# Patient Record
Sex: Female | Born: 1965
Health system: Southern US, Community
[De-identification: ages and names within clinical notes are randomized; demographics above are authoritative.]

## PROBLEM LIST (undated history)

## (undated) VITALS — BP 119/82 | HR 87 | Temp 99.2°F | Resp 16 | Ht 69.0 in | Wt 181.0 lb

## (undated) DIAGNOSIS — Z803 Family history of malignant neoplasm of breast: Secondary | ICD-10-CM

## (undated) DIAGNOSIS — D649 Anemia, unspecified: Secondary | ICD-10-CM

## (undated) DIAGNOSIS — IMO0001 Reserved for inherently not codable concepts without codable children: Secondary | ICD-10-CM

## (undated) DIAGNOSIS — E119 Type 2 diabetes mellitus without complications: Secondary | ICD-10-CM

## (undated) DIAGNOSIS — R519 Headache, unspecified: Secondary | ICD-10-CM

## (undated) DIAGNOSIS — M1990 Unspecified osteoarthritis, unspecified site: Secondary | ICD-10-CM

## (undated) DIAGNOSIS — F329 Major depressive disorder, single episode, unspecified: Secondary | ICD-10-CM

## (undated) DIAGNOSIS — R51 Headache: Secondary | ICD-10-CM

## (undated) DIAGNOSIS — C50919 Malignant neoplasm of unspecified site of unspecified female breast: Secondary | ICD-10-CM

## (undated) DIAGNOSIS — IMO0002 Reserved for concepts with insufficient information to code with codable children: Secondary | ICD-10-CM

## (undated) DIAGNOSIS — F32A Depression, unspecified: Secondary | ICD-10-CM

## (undated) DIAGNOSIS — I890 Lymphedema, not elsewhere classified: Secondary | ICD-10-CM

## (undated) DIAGNOSIS — F99 Mental disorder, not otherwise specified: Secondary | ICD-10-CM

## (undated) DIAGNOSIS — R232 Flushing: Secondary | ICD-10-CM

## (undated) HISTORY — DX: Flushing: R23.2

## (undated) HISTORY — PX: TUBAL LIGATION: SHX77

## (undated) HISTORY — DX: Unspecified osteoarthritis, unspecified site: M19.90

## (undated) HISTORY — DX: Headache, unspecified: R51.9

## (undated) HISTORY — DX: Reserved for inherently not codable concepts without codable children: IMO0001

## (undated) HISTORY — DX: Reserved for concepts with insufficient information to code with codable children: IMO0002

## (undated) HISTORY — DX: Headache: R51

## (undated) HISTORY — DX: Malignant neoplasm of unspecified site of unspecified female breast: C50.919

## (undated) HISTORY — DX: Family history of malignant neoplasm of breast: Z80.3

---

## 1993-10-31 HISTORY — PX: KNEE SURGERY: SHX244

## 2000-07-25 ENCOUNTER — Emergency Department (HOSPITAL_COMMUNITY): Admission: EM | Admit: 2000-07-25 | Discharge: 2000-07-25 | Payer: Self-pay | Admitting: Emergency Medicine

## 2000-08-05 ENCOUNTER — Emergency Department (HOSPITAL_COMMUNITY): Admission: EM | Admit: 2000-08-05 | Discharge: 2000-08-05 | Payer: Self-pay | Admitting: *Deleted

## 2000-08-15 ENCOUNTER — Inpatient Hospital Stay (HOSPITAL_COMMUNITY): Admission: AD | Admit: 2000-08-15 | Discharge: 2000-08-15 | Payer: Self-pay | Admitting: *Deleted

## 2000-08-20 ENCOUNTER — Emergency Department (HOSPITAL_COMMUNITY): Admission: EM | Admit: 2000-08-20 | Discharge: 2000-08-21 | Payer: Self-pay | Admitting: Emergency Medicine

## 2000-09-29 ENCOUNTER — Emergency Department (HOSPITAL_COMMUNITY): Admission: EM | Admit: 2000-09-29 | Discharge: 2000-09-29 | Payer: Self-pay | Admitting: Emergency Medicine

## 2000-09-30 ENCOUNTER — Emergency Department (HOSPITAL_COMMUNITY): Admission: EM | Admit: 2000-09-30 | Discharge: 2000-09-30 | Payer: Self-pay | Admitting: Emergency Medicine

## 2000-10-03 ENCOUNTER — Emergency Department (HOSPITAL_COMMUNITY): Admission: EM | Admit: 2000-10-03 | Discharge: 2000-10-03 | Payer: Self-pay | Admitting: Emergency Medicine

## 2000-12-04 ENCOUNTER — Emergency Department (HOSPITAL_COMMUNITY): Admission: EM | Admit: 2000-12-04 | Discharge: 2000-12-05 | Payer: Self-pay | Admitting: Emergency Medicine

## 2000-12-19 ENCOUNTER — Emergency Department (HOSPITAL_COMMUNITY): Admission: EM | Admit: 2000-12-19 | Discharge: 2000-12-19 | Payer: Self-pay | Admitting: *Deleted

## 2001-02-21 ENCOUNTER — Emergency Department (HOSPITAL_COMMUNITY): Admission: EM | Admit: 2001-02-21 | Discharge: 2001-02-21 | Payer: Self-pay | Admitting: Emergency Medicine

## 2001-02-27 ENCOUNTER — Emergency Department (HOSPITAL_COMMUNITY): Admission: EM | Admit: 2001-02-27 | Discharge: 2001-02-27 | Payer: Self-pay | Admitting: Emergency Medicine

## 2001-03-21 ENCOUNTER — Emergency Department (HOSPITAL_COMMUNITY): Admission: EM | Admit: 2001-03-21 | Discharge: 2001-03-21 | Payer: Self-pay | Admitting: Emergency Medicine

## 2001-03-28 ENCOUNTER — Emergency Department (HOSPITAL_COMMUNITY): Admission: EM | Admit: 2001-03-28 | Discharge: 2001-03-28 | Payer: Self-pay | Admitting: Emergency Medicine

## 2001-03-28 ENCOUNTER — Encounter: Payer: Self-pay | Admitting: Emergency Medicine

## 2001-05-10 ENCOUNTER — Emergency Department (HOSPITAL_COMMUNITY): Admission: EM | Admit: 2001-05-10 | Discharge: 2001-05-10 | Payer: Self-pay

## 2001-05-17 ENCOUNTER — Emergency Department (HOSPITAL_COMMUNITY): Admission: EM | Admit: 2001-05-17 | Discharge: 2001-05-17 | Payer: Self-pay | Admitting: Emergency Medicine

## 2001-05-17 ENCOUNTER — Encounter: Payer: Self-pay | Admitting: Emergency Medicine

## 2001-05-25 ENCOUNTER — Emergency Department (HOSPITAL_COMMUNITY): Admission: EM | Admit: 2001-05-25 | Discharge: 2001-05-25 | Payer: Self-pay | Admitting: Emergency Medicine

## 2001-07-02 ENCOUNTER — Emergency Department (HOSPITAL_COMMUNITY): Admission: EM | Admit: 2001-07-02 | Discharge: 2001-07-02 | Payer: Self-pay | Admitting: Emergency Medicine

## 2001-07-05 ENCOUNTER — Emergency Department (HOSPITAL_COMMUNITY): Admission: EM | Admit: 2001-07-05 | Discharge: 2001-07-05 | Payer: Self-pay | Admitting: Emergency Medicine

## 2001-07-16 ENCOUNTER — Emergency Department (HOSPITAL_COMMUNITY): Admission: EM | Admit: 2001-07-16 | Discharge: 2001-07-16 | Payer: Self-pay | Admitting: Emergency Medicine

## 2001-07-16 ENCOUNTER — Encounter: Payer: Self-pay | Admitting: Emergency Medicine

## 2001-09-05 ENCOUNTER — Emergency Department (HOSPITAL_COMMUNITY): Admission: EM | Admit: 2001-09-05 | Discharge: 2001-09-05 | Payer: Self-pay | Admitting: Emergency Medicine

## 2001-09-28 ENCOUNTER — Emergency Department (HOSPITAL_COMMUNITY): Admission: EM | Admit: 2001-09-28 | Discharge: 2001-09-28 | Payer: Self-pay | Admitting: Emergency Medicine

## 2002-01-15 ENCOUNTER — Emergency Department (HOSPITAL_COMMUNITY): Admission: EM | Admit: 2002-01-15 | Discharge: 2002-01-15 | Payer: Self-pay | Admitting: Emergency Medicine

## 2002-02-04 ENCOUNTER — Emergency Department (HOSPITAL_COMMUNITY): Admission: EM | Admit: 2002-02-04 | Discharge: 2002-02-04 | Payer: Self-pay | Admitting: Emergency Medicine

## 2002-03-22 ENCOUNTER — Emergency Department (HOSPITAL_COMMUNITY): Admission: EM | Admit: 2002-03-22 | Discharge: 2002-03-22 | Payer: Self-pay | Admitting: Emergency Medicine

## 2002-04-29 ENCOUNTER — Emergency Department (HOSPITAL_COMMUNITY): Admission: EM | Admit: 2002-04-29 | Discharge: 2002-04-29 | Payer: Self-pay | Admitting: Emergency Medicine

## 2002-07-22 ENCOUNTER — Emergency Department (HOSPITAL_COMMUNITY): Admission: EM | Admit: 2002-07-22 | Discharge: 2002-07-22 | Payer: Self-pay | Admitting: Emergency Medicine

## 2002-09-24 ENCOUNTER — Emergency Department (HOSPITAL_COMMUNITY): Admission: EM | Admit: 2002-09-24 | Discharge: 2002-09-24 | Payer: Self-pay | Admitting: Emergency Medicine

## 2003-01-02 ENCOUNTER — Emergency Department (HOSPITAL_COMMUNITY): Admission: EM | Admit: 2003-01-02 | Discharge: 2003-01-02 | Payer: Self-pay | Admitting: Emergency Medicine

## 2003-01-22 ENCOUNTER — Inpatient Hospital Stay (HOSPITAL_COMMUNITY): Admission: AD | Admit: 2003-01-22 | Discharge: 2003-01-22 | Payer: Self-pay | Admitting: Obstetrics and Gynecology

## 2003-01-22 ENCOUNTER — Encounter: Payer: Self-pay | Admitting: Obstetrics and Gynecology

## 2003-02-06 ENCOUNTER — Other Ambulatory Visit: Admission: RE | Admit: 2003-02-06 | Discharge: 2003-02-06 | Payer: Self-pay | Admitting: Obstetrics and Gynecology

## 2003-02-06 ENCOUNTER — Encounter: Admission: RE | Admit: 2003-02-06 | Discharge: 2003-02-06 | Payer: Self-pay | Admitting: Obstetrics and Gynecology

## 2003-04-17 ENCOUNTER — Emergency Department (HOSPITAL_COMMUNITY): Admission: EM | Admit: 2003-04-17 | Discharge: 2003-04-17 | Payer: Self-pay

## 2003-04-29 ENCOUNTER — Emergency Department (HOSPITAL_COMMUNITY): Admission: EM | Admit: 2003-04-29 | Discharge: 2003-04-29 | Payer: Self-pay | Admitting: Emergency Medicine

## 2003-11-01 HISTORY — PX: UTERINE FIBROID SURGERY: SHX826

## 2003-11-10 ENCOUNTER — Emergency Department (HOSPITAL_COMMUNITY): Admission: EM | Admit: 2003-11-10 | Discharge: 2003-11-10 | Payer: Self-pay | Admitting: Emergency Medicine

## 2004-01-12 ENCOUNTER — Inpatient Hospital Stay (HOSPITAL_COMMUNITY): Admission: AD | Admit: 2004-01-12 | Discharge: 2004-01-13 | Payer: Self-pay | Admitting: Obstetrics

## 2004-04-07 ENCOUNTER — Encounter (INDEPENDENT_AMBULATORY_CARE_PROVIDER_SITE_OTHER): Payer: Self-pay | Admitting: Specialist

## 2004-04-07 ENCOUNTER — Ambulatory Visit (HOSPITAL_COMMUNITY): Admission: RE | Admit: 2004-04-07 | Discharge: 2004-04-07 | Payer: Self-pay | Admitting: Obstetrics

## 2004-04-17 ENCOUNTER — Emergency Department (HOSPITAL_COMMUNITY): Admission: EM | Admit: 2004-04-17 | Discharge: 2004-04-17 | Payer: Self-pay | Admitting: Emergency Medicine

## 2005-03-14 ENCOUNTER — Emergency Department (HOSPITAL_COMMUNITY): Admission: EM | Admit: 2005-03-14 | Discharge: 2005-03-14 | Payer: Self-pay | Admitting: Emergency Medicine

## 2005-05-12 ENCOUNTER — Ambulatory Visit: Payer: Self-pay | Admitting: Psychiatry

## 2005-05-12 ENCOUNTER — Inpatient Hospital Stay (HOSPITAL_COMMUNITY): Admission: RE | Admit: 2005-05-12 | Discharge: 2005-05-17 | Payer: Self-pay | Admitting: Psychiatry

## 2006-05-01 ENCOUNTER — Emergency Department (HOSPITAL_COMMUNITY): Admission: EM | Admit: 2006-05-01 | Discharge: 2006-05-01 | Payer: Self-pay | Admitting: Emergency Medicine

## 2007-09-19 ENCOUNTER — Emergency Department (HOSPITAL_COMMUNITY): Admission: EM | Admit: 2007-09-19 | Discharge: 2007-09-19 | Payer: Self-pay | Admitting: Emergency Medicine

## 2007-09-29 ENCOUNTER — Emergency Department (HOSPITAL_COMMUNITY): Admission: EM | Admit: 2007-09-29 | Discharge: 2007-09-29 | Payer: Self-pay | Admitting: Family Medicine

## 2007-10-08 ENCOUNTER — Emergency Department (HOSPITAL_COMMUNITY): Admission: EM | Admit: 2007-10-08 | Discharge: 2007-10-08 | Payer: Self-pay | Admitting: Family Medicine

## 2007-12-15 ENCOUNTER — Emergency Department (HOSPITAL_COMMUNITY): Admission: EM | Admit: 2007-12-15 | Discharge: 2007-12-15 | Payer: Self-pay | Admitting: Emergency Medicine

## 2007-12-16 ENCOUNTER — Emergency Department (HOSPITAL_COMMUNITY): Admission: EM | Admit: 2007-12-16 | Discharge: 2007-12-16 | Payer: Self-pay | Admitting: Emergency Medicine

## 2008-03-08 ENCOUNTER — Emergency Department (HOSPITAL_COMMUNITY): Admission: EM | Admit: 2008-03-08 | Discharge: 2008-03-08 | Payer: Self-pay | Admitting: Emergency Medicine

## 2008-03-23 ENCOUNTER — Emergency Department (HOSPITAL_COMMUNITY): Admission: EM | Admit: 2008-03-23 | Discharge: 2008-03-23 | Payer: Self-pay | Admitting: Emergency Medicine

## 2008-03-27 ENCOUNTER — Emergency Department (HOSPITAL_COMMUNITY): Admission: EM | Admit: 2008-03-27 | Discharge: 2008-03-27 | Payer: Self-pay | Admitting: Emergency Medicine

## 2008-04-07 ENCOUNTER — Other Ambulatory Visit: Payer: Self-pay | Admitting: Emergency Medicine

## 2008-04-08 ENCOUNTER — Inpatient Hospital Stay (HOSPITAL_COMMUNITY): Admission: AD | Admit: 2008-04-08 | Discharge: 2008-04-14 | Payer: Self-pay | Admitting: Psychiatry

## 2008-04-08 ENCOUNTER — Ambulatory Visit: Payer: Self-pay | Admitting: Psychiatry

## 2008-04-08 ENCOUNTER — Other Ambulatory Visit: Payer: Self-pay | Admitting: Emergency Medicine

## 2008-08-19 ENCOUNTER — Emergency Department (HOSPITAL_COMMUNITY): Admission: EM | Admit: 2008-08-19 | Discharge: 2008-08-19 | Payer: Self-pay | Admitting: Emergency Medicine

## 2009-07-22 ENCOUNTER — Encounter: Admission: RE | Admit: 2009-07-22 | Discharge: 2009-07-22 | Payer: Self-pay | Admitting: Specialist

## 2009-10-31 HISTORY — PX: FOOT SURGERY: SHX648

## 2009-11-06 ENCOUNTER — Emergency Department (HOSPITAL_COMMUNITY): Admission: EM | Admit: 2009-11-06 | Discharge: 2009-11-06 | Payer: Self-pay | Admitting: Emergency Medicine

## 2010-02-03 ENCOUNTER — Emergency Department (HOSPITAL_COMMUNITY): Admission: EM | Admit: 2010-02-03 | Discharge: 2010-02-03 | Payer: Self-pay | Admitting: Emergency Medicine

## 2010-05-05 ENCOUNTER — Inpatient Hospital Stay (HOSPITAL_COMMUNITY): Admission: AD | Admit: 2010-05-05 | Discharge: 2010-05-05 | Payer: Self-pay | Admitting: Obstetrics & Gynecology

## 2010-05-11 ENCOUNTER — Emergency Department (HOSPITAL_COMMUNITY): Admission: EM | Admit: 2010-05-11 | Discharge: 2010-05-11 | Payer: Self-pay | Admitting: Emergency Medicine

## 2010-08-04 ENCOUNTER — Ambulatory Visit (HOSPITAL_COMMUNITY): Admission: RE | Admit: 2010-08-04 | Discharge: 2010-08-04 | Payer: Self-pay | Admitting: Podiatry

## 2010-11-21 ENCOUNTER — Encounter: Payer: Self-pay | Admitting: Specialist

## 2010-11-21 ENCOUNTER — Encounter: Payer: Self-pay | Admitting: Obstetrics

## 2010-12-25 ENCOUNTER — Inpatient Hospital Stay (HOSPITAL_COMMUNITY)
Admission: AD | Admit: 2010-12-25 | Discharge: 2010-12-25 | Disposition: A | Discharge status: Home / Self Care | Payer: Self-pay | Source: Ambulatory Visit | Attending: Obstetrics & Gynecology | Admitting: Obstetrics & Gynecology

## 2010-12-25 DIAGNOSIS — K219 Gastro-esophageal reflux disease without esophagitis: Secondary | ICD-10-CM

## 2010-12-25 DIAGNOSIS — R109 Unspecified abdominal pain: Secondary | ICD-10-CM

## 2010-12-25 LAB — URINALYSIS, ROUTINE W REFLEX MICROSCOPIC
Bilirubin Urine: NEGATIVE
Ketones, ur: NEGATIVE mg/dL
Nitrite: NEGATIVE
Protein, ur: NEGATIVE mg/dL
Urobilinogen, UA: 0.2 mg/dL (ref 0.0–1.0)
pH: 5.5 (ref 5.0–8.0)

## 2010-12-25 LAB — COMPREHENSIVE METABOLIC PANEL
AST: 17 U/L (ref 0–37)
CO2: 26 mEq/L (ref 19–32)
Calcium: 9 mg/dL (ref 8.4–10.5)
Creatinine, Ser: 0.52 mg/dL (ref 0.4–1.2)
GFR calc Af Amer: >60 mL/min (ref >60)
GFR calc non Af Amer: >60 mL/min (ref >60)

## 2010-12-25 LAB — CBC
Hemoglobin: 11.8 g/dL — ABNORMAL LOW (ref 12.0–15.0)
MCH: 29.9 pg (ref 26.0–34.0)
MCHC: 32.5 g/dL (ref 30.0–36.0)

## 2011-01-16 LAB — URINALYSIS, ROUTINE W REFLEX MICROSCOPIC
Bilirubin Urine: NEGATIVE
Bilirubin Urine: NEGATIVE
Glucose, UA: NEGATIVE mg/dL
Ketones, ur: NEGATIVE mg/dL
Nitrite: NEGATIVE
Specific Gravity, Urine: >1.03 — ABNORMAL HIGH (ref 1.005–1.030)
Specific Gravity, Urine: 1.029 (ref 1.005–1.030)
pH: 6 (ref 5.0–8.0)
pH: 5 (ref 5.0–8.0)

## 2011-01-16 LAB — COMPREHENSIVE METABOLIC PANEL
ALT: 9 U/L (ref 0–35)
Albumin: 3.6 g/dL (ref 3.5–5.2)
Alkaline Phosphatase: 43 U/L (ref 39–117)
GFR calc Af Amer: >60 mL/min (ref >60)
Potassium: 3.5 mEq/L (ref 3.5–5.1)
Sodium: 137 mEq/L (ref 135–145)
Total Protein: 6.6 g/dL (ref 6.0–8.3)

## 2011-01-16 LAB — CBC
Platelets: 338 10*3/uL (ref 150–400)
RDW: 14 % (ref 11.5–15.5)
WBC: 10.1 10*3/uL (ref 4.0–10.5)

## 2011-01-16 LAB — POCT PREGNANCY, URINE: Preg Test, Ur: NEGATIVE

## 2011-01-16 LAB — AMYLASE: Amylase: 53 U/L (ref 0–105)

## 2011-03-15 NOTE — H&P (Signed)
Laura Matthews, Laura Matthews NO.:  [id number]   MEDICAL RECORD NO.:  [id number]          PATIENT TYPE:  IPS   LOCATION:  0503                          FACILITY:  [facility]   PHYSICIAN:  Geoffery Lyons, M.D.      DATE OF BIRTH:  1966-09-04   DATE OF ADMISSION:  04/08/2008  DATE OF DISCHARGE:                       PSYCHIATRIC ADMISSION ASSESSMENT   A 45 old female voluntarily admitted April 08, 2008.   HISTORY OF PRESENT ILLNESS:  The patient presents with a history of  opiate dependency, using anywhere up to 20 a day of anything that she  can get, mostly oxycodone, using daily, swallowing the pills.  Her last  use was on Sunday, which was June 7.  She is just tired of spending  money, ruining her body and having to go through withdrawal symptoms  when she is unable to obtain medication.  Sleep has been poor, appetite  decreased.  Denies any depression or suicidal thoughts.   PAST PSYCHIATRIC HISTORY:  The second time to [facility].  The  patient was here in 2005 for the same thing opiate dependence.  Relapsed almost immediately after her last admission.   SOCIAL HISTORY:  A 45 year old single female lives her adult daughter.  She is unemployed.  No legal problems.   FAMILY HISTORY:  Unknown.   ALCOHOL/DRUG HISTORY:  Denies any alcohol use.  She is trying to stop  smoking.  Again, daily use of opiates and was positive for cocaine.   PRIMARY CARE Laura Matthews:  None.   MEDICAL PROBLEMS:  Denies any acute or chronic health issues.   MEDICATIONS:  None prior to arrival.   DRUG ALLERGIES:  No known allergies.   PHYSICAL EXAMINATION:  The patient was fully assessed at [facility] where she received Imodium, Ativan, Xanax, and a  nicotine patch.  Temperature 98, heart rate 97, respirations 20, blood  pressure is 118/79, 5 feet 10 inches tall, 179 pounds.   LABORATORY DATA:  White count is elevated 11.3, platelet count of 42.  BMP within normal limits.   Alcohol level less than 5.  Urine drug screen  is positive for opiates, positive for cocaine.  Acetaminophen level less  than 10.   MENTAL STATUS EXAM:  She is in the bed, eyes are closed, sleeping.  Answering questions, somewhat irritable.  She apologized for being a  little curette.  Her speech is clear.  Thought processes are coherent  and goal directed.  No evidence of any delusional thinking, cognitive  function intact.  Memory is good.  Judgment insight is fair.   AXIS I:  Opiate dependence, cocaine abuse.  AXIS II:  Deferred.  AXIS III:  No known medical conditions.  AXIS IV:  Psychosocial problems, chronic substance use.  Also problems  with economic issues.  AXIS V:  Current is 45.   PLAN:  She contracted for safety.  Stabilize mood and thinking.  Will  detox the patient and work on relapse prevention.  Will continue to  assess comorbidities.  The patient is to attend the red group.  We will  have  medication available for sleep.  Case manager will discuss with the  patient her plan for relapse.  Length of stay is 3-5 days.      Landry Corporal, N.P.      Geoffery Lyons, M.D.  Electronically Signed    JO/MEDQ  D:  04/09/2008  T:  04/09/2008  Job:  [id number]

## 2011-03-18 NOTE — H&P (Signed)
Laura Matthews, KASPAREK NO.:  [id number]   MEDICAL RECORD NO.:  [id number]          PATIENT TYPE:  IPS   LOCATION:  0501                          FACILITY:  [facility]   PHYSICIAN:  Anselm Jungling, MD  DATE OF BIRTH:  Jun 27, 1966   DATE OF ADMISSION:  05/12/2005  DATE OF DISCHARGE:                         PSYCHIATRIC ADMISSION ASSESSMENT   IDENTIFYING INFORMATION:  This is a 45 year old African-American female who  is single.  This is a voluntary admission.   HISTORY OF PRESENT ILLNESS:  The patient with a history of depression  relapsed on opiates about 2 to 2-1/2 months ago after being clean 10-12  months while she was in prison.  She had been in prison for violating  probation after a larceny charge.  She failed probation by failing her drug  test.  Micah Flesher back to prison for awhile, was sober for about 2 months after  she got out in March, then relapsed, taking opiates.  She started using  opiates in 2000, introduced to them recreationally by family member.  Dosage  has escalated to the point where she is taking about 20 tablets daily of  either Percocet or Lortab or whatever she is able to get her hands on.  No  IV drug abuse.  Now reports increasing depression for the past 3-4 weeks,  thinking of overdosing on pills and has a history of a prior overdose in  1998.  She has been anhedonic, with decreased concentration, some vague  paranoia, feeling like people are talking about her, feeling like she needs  to look around and check herself for people looking at her, feeling  apathetic, unable to get organized and get out of bed in the morning and get  to work.  Quit her job last week because she knew she would get fired for  her absenteeism.  Also relapsed in using cocaine and has been using  approximately $200 a day about 3 times a week for the past few weeks.  Feels  overwhelmed by family stressors, including her 43 year old that she has  found out now is pregnant for  the second time.  She is supporting the family  but unable to get interested in any work.  She endorses a history of  episodes of depression which come like attacks out of the blue.  Denies any  mania, panic or hallucinations.   PAST PSYCHIATRIC HISTORY:  The patient has no current mental health care at  this time.  This is her first admission to [facility].  She has a history of prior admission to [facility] in 1998 for a  drug overdose, at which time she was on the Behavioral Health unit but  denies that she was ever treated with medications for depression, reports  onset of depression about age 6 or 77 with episodes of severity from time  to time, now using substances with history of opiate abuse and dependence on  and off since the year 2000, longest clean and sober was approximately 10  months.  Urine drug screen was positive for opiates, cocaine and  benzodiazepines.  She had taken 3 or 4 Xanax this week but reports that she  rarely takes those.  However, endorses feeling anxious and tremulous today.   SOCIAL HISTORY:  The patient is currently living with her 44 year old  daughter who has a young child and is pregnant with a second child.  She has  one 60 year old son who is currently in prison and a 24 year old daughter  that lives with her own father.  The patient has currently been working in [employer] in town, quit her job last week, no current legal charges.   ALCOHOL AND DRUG HISTORY:  Noted above.   PAST MEDICAL HISTORY:  The patient is followed by Dr. Corine Shelter here  in town, denies any current medical problems, has had some past problems  with migraine headaches for which she was prescribed Vicoprofen.  Denies any  history of seizures, blackouts or memory loss.  No myocardial infarction, no  diabetes mellitus, no history of liver disease.  Does have a prior history  of bilateral tubal ligation.   MEDICATIONS:  Vicoprofen which  was prescribed for headaches.   DRUG ALLERGIES:  No known drug allergies.   POSITIVE PHYSICAL FINDINGS:  This is a well-nourished, well-developed,  healthy-appearing African-American female who does have some puffiness  around her eyes and is complaining of some loose stools, rumbling intestines  today.  Review of systems also positive for feeling sluggish, with poor  sleep.  Denies any headache or pain today, has had some intestinal cramping,  nausea, feeling jittery, restless, sleep decreased to 2-3 hours per night,  and strong anhedonia.  No weight change.   PHYSICAL EXAMINATION:  Five feet 10 inches tall, 187 pounds, temperature  98.6, pulse 66, respirations 16, blood pressure 114/66.  Her full physical  examination was done in the emergency room, is noted in the record.   DIAGNOSTIC STUDIES:  WBC 8.4, hemoglobin 11.7, hematocrit 34.4, platelets  381,000.  Sodium 137, potassium 3.4, chloride 101, CO2 27, BUN 4, creatinine  0.7, glucose 154.  AST 27, ALT 34, alkaline phosphatase 64, total bilirubin  0.3.  Acetaminophen level was less than 10 in the emergency room, salicylate  level was less than 4.  TSH is currently pending as is her routine  urinalysis.   MENTAL STATUS EXAM:  Fully alert female, appears generally healthy, with  affect flattening, tearful.  Appears guarded, does warm with conversation in  terms of her mood but manner continues to be apathetic and guarded.  Speech  is slowed and decreased in amount..  Mood is depressed, hopeless.  Thought  process positive for suicide ideation with a plan to overdose on  medications.  No homicidal ideation or auditory or visual hallucinations.  Clearly describes some paranoia, with guarding, feeling unsafe in large  groups, feeling like everyone is looking at her in the cafeteria, not  tolerating crowds at all.  Cognitively she is intact and oriented x4. Insight adequate, impulse control and judgment within normal limits.   Intellect within normal limits.   ADMISSION DIAGNOSIS:  AXIS I:  Rule out bipolar 2 disorder, opiate abuse and  dependence, polysubstance abuse.  AXIS II:  Deferred.  AXIS III:  No diagnosis.  AXIS IV:  Family stress with parenting.  AXIS V:  Current 28-35, past year 30.   INITIAL PLAN OF CARE:  Plan is to voluntarily admit the patient with q.15  minute checks in place for a safe detox and to alleviate her suicidal  thought.  Because of her episodic depression, feeling that she has sharp  turns in mood, we are going to start her on Lamictal 25 mg daily.  We  started her on a clonidine protocol to withdraw her from the opiate drugs.  We will also start her on Risperdal 0.25 mg q.6h p.r.n. for agitation or if  her paranoia gets worse.  Meanwhile, we have also placed her on a Librium  protocol, although her description of taking the Xanax is not matching up  with her anxiety and agitation.  If she becomes over sedated we will stop  it, but at this point we felt that the Librium protocol was necessary.  We  have discussed the plan of care with her.  For aftercare, she feels that one  of her main triggers is returning to the apartment where she has a lot of  memories of drug use.  She has had an offer from a clean and sober friend in  [place] to come live there and change her environment, which she is strongly  considering.  Meanwhile, we will try to put together some aftercare here  until she can get to [place], which is her plan.   ESTIMATED LENGTH OF STAY:  5 days.       MAS/MEDQ  D:  05/13/2005  T:  05/13/2005  Job:  [id number]

## 2011-03-18 NOTE — Discharge Summary (Signed)
NAME:  Laura Matthews, Laura Matthews NO.:  [id number]   MEDICAL RECORD NO.:  [id number]          PATIENT TYPE:  IPS   LOCATION:  0501                          FACILITY:  [facility]   PHYSICIAN:  Jeanice Lim, M.D. DATE OF BIRTH:  1966-05-07   DATE OF ADMISSION:  05/12/2005  DATE OF DISCHARGE:  05/17/2005                                 DISCHARGE SUMMARY   IDENTIFYING DATA:  This is a 45 year old African-American female, single,  admitted with a history of depression, relapsed on opiates 2-1/2 months ago,  had been clean 10-12 months while in prison for violating probation for  larceny charge, had failed probation by failing a drug test and went back to  prison for awhile and been sober 2 months and then relapsed taking opiates.  Dosage had escalated to the point of being unable to control and feeling  that she wanted to overdose on pills.  No IV drug use.  Had quit her job  last week because she knew she would get fired for absenteeism.  Relapsed  using cocaine, approximately $200 a day 3 times a week, feeling overwhelmed  by family stressors, including her 43 year old that she has found out now is  pregnant.  First admission to [facility], history of prior admission at [facility] for drug overdose.  Took medications for depression at about age 45-  69, using substances off and on with opiates particularly.  Longest period  of sobriety was 10 months.  Urine drug screen positive for opiates, cocaine,  and benzodiazepines.  Had taken 3 to 5 Xanax this week but reports rarely  taking these.   ADMISSION MEDICATIONS:  Vicoprofen prescribed for headaches as per patient.   ALLERGIES:  No known drug allergies.   PHYSICAL AND NEUROLOGICAL EXAMINATION:  Within normal limits.   ROUTINE ADMISSION LABS:  Within normal limits.   MENTAL STATUS EXAM:  Fully alert female, generally in good health, affect is  flattening, tearful, guarded, apathetic.  Speech slow, decreased mood,  depressed,  positive suicidal ideation.  Cognition was intact.  Judgment and  insight were impaired, with poor impulse control.   ADMISSION DIAGNOSES:  AXIS I:  Rule out bipolar II disorder, opiate  dependence, polysubstance abuse, possible benzodiazepines dependence.  AXIS II:  Deferred.  AXIS III:  None.  AXIS IV:  Family stress and parenting.  AXIS V:  28/60.   HOSPITAL COURSE:  The patient was admitted and ordered routine p.r.n.  medications, underwent further monitoring, and was encouraged to participate  in individual, group and milieu therapy, was started on Lamictal.  Risk of a  rash and other medication education given and Risperdal p.r.n. agitation due  to some suspiciousness which is generalized.  The patient reported positive  response to crisis intervention, was stabilized on medications and  discharged in improved condition with no withdrawal symptoms, no psychotic  symptoms, no dangerous ideation, again given medication education and  discharged on:  1.  Multivitamin.  2.  Risperdal 0.25 q.a.m. and 1 mg q.h.s.  3.  Wellbutrin XL 150 mg q.a.m.  4.  Depakote 250 mg  q.h.s.   DISPOSITION:  The patient was discharged to follow up with [facility] and was  planning on moving to [place] next week and follow up with the [facility]  Center in that area.   DISCHARGE DIAGNOSES:  AXIS I:  Rule out bipolar II disorder, opiate  dependence, polysubstance abuse, possible benzodiazepines dependence.  AXIS II:  Deferred.  AXIS III:  None.  AXIS IV:  Family stress and parenting.  AXIS V:  Global assessment of function on discharge was 55.      Jeanice Lim, M.D.  Electronically Signed     JEM/MEDQ  D:  06/23/2005  T:  06/23/2005  Job:  [id number]

## 2011-03-18 NOTE — Op Note (Signed)
NAME:  Laura Matthews, Laura Matthews                         ACCOUNT NO.:  [id number]   MEDICAL RECORD NO.:  [id number]                   PATIENT TYPE:  AMB   LOCATION:  SDC                                  FACILITY:  [facility]   PHYSICIAN:  Kathreen Cosier, M.D.           DATE OF BIRTH:  19-Apr-1966   DATE OF PROCEDURE:  04/07/2004  DATE OF DISCHARGE:                                 OPERATIVE REPORT   PREOPERATIVE DIAGNOSIS:  Dysfunctional uterine bleeding, dysmenorrhea.   POSTOPERATIVE DIAGNOSIS:  Dysfunctional uterine bleeding, dysmenorrhea.   PROCEDURE:  Dilatation and curettage, hysteroscopy, Novasure ablation.   Using general anesthesia with the patient in the lithotomy position, the  perineum and vagina were prepped and draped.  The bladder was emptied with  straight catheter.  Bimanual exam revealed the uterus to be top normal size,  negative adnexa.  A bivalve speculum was placed in the vagina.  The anterior  lip of the cervix was grasped with the tenaculum.  The cervix was curetted  with a small amount of tissue obtained.  The endometrial cavity was sounded  to 10 cm.  The cervical length was measured with a Hegar dilator, the  endometrial cavity length is 5 cm.  The cervix dilated to a number 27 Pratt.  Diagnostic hysteroscope was inserted.  The pump was set at 70 mmHg, fluid  deficit 70 mL.  It was noted there was a large amount of tissue in the  cavity, no polyps were identified.  Hysteroscopy was terminated and the  Novasure device was inserted and seated.  The cavity width was 3 cm.  The  cavity integrity test was done and the cavity was intact.  The ablation was  performed for 105 seconds with 118 watts of power.  After the ablation, the  hysteroscopy was repeated and the cavity was noted to be totally ablated.  The first procedure was terminated.  The patient tolerated the procedure  well and went to the recovery room in good condition.      Kathreen Cosier, M.D.    BAM/MEDQ  D:  04/07/2004  T:  04/07/2004  Job:  [id number]

## 2011-03-18 NOTE — Discharge Summary (Signed)
Laura Matthews, Laura Matthews NO.:  [id number]   MEDICAL RECORD NO.:  [id number]          PATIENT TYPE:  IPS   LOCATION:  0503                          [facility]ACILITY:  [facility]   PHYSICIAN:  Geoffery Lyons, M.D.      DATE OF BIRTH:  1965/12/04   DATE OF ADMISSION:  04/08/2008  DATE OF DISCHARGE:  04/14/2008                               DISCHARGE SUMMARY   CHIEF COMPLAINT/PRESENT ILLNESS:  It was the second admission to [facility].  First one in 2005 for this.  A 45 year old  female voluntarily admitted.  History of opiate dependence using  anywhere up to 20 a day of anything that she can get, mostly OxyContin,  swallowing the pills.  Last use was on Sunday, June 7.  Tired of  spending money, ruining her body, and having to go through withdrawal  symptoms when she is unable to obtain medications.  Endorsed decreased  appetite and weight loss.  Persistent use of opiates.  Occasional use of  cocaine.   PAST PSYCHIATRIC HISTORY:  Second time at [facility] in 2005.  Opiate dependency.   MEDICAL HISTORY:  Noncontributory.   MEDICATIONS:  None.   PHYSICAL EXAMINATION:  Failed to show any acute findings.   LABORATORY WORKUP:  White blood cells 11.3.  UDS positive for opiates  and cocaine.  SGOT 14, SGPT 16, total bilirubin 0.4, TSH 1.277.   Exam reveals a very cooperative female in bed, eyes closed, somewhat  irritable, but apologizing.  Endorsed she is not feeling well.  Mood is  anxious, irritable.  Affect is anxious, depressed.  Thought process is  logical, coherent, and relevant.  Endorsed feeling very overwhelmed.  No  active suicidal or homicidal ideas.  No evidence of delusions.  No  hallucinations.  Cognition well preserved.   ADMISSION DIAGNOSES:  AXIS I:  Opiate dependence, cocaine abuse, mood  disorder not otherwise specified.  AXIS II:  No diagnosis.  AXIS III:  No diagnosis.  AXIS IV:  Moderate.  AXIS V:  Upon admission 35-40.  GAF in the  last year 60.   COURSE IN THE HOSPITAL:  She was admitted.  Started individual and group  psychotherapy.  We detoxified with clonidine.  She was given trazodone  that was later switched to Ambien due to ineffectiveness of the  trazodone.  She initially endorsed that she was not feeling good, not  sleeping at all, using opiates over the last 10 years of Percocet,  Vicodin, Lortab, methadone of 60-70 dollars a day.  Occasional use of  cocaine.  June 11 was pretty irritable, angry, did not sleep, projecting  a lot of blame anger towards staff, wanted to go to a 30-day program.  Would not accept a 14, then wants no help, then wants to be discharged,  and then not.  We pursued Seroquel.  June 12 was irritable but endorsed  anxiety.  She did start getting better.  Did not seem that Librium was  holding her so we switched to Ativan for detox.  June 13 continued to be  irritable but apologized.  She wanted to be placed considering [facility].  June 14 continued to have a hard time.  Endorsed she has not had any  significant sobriety.  Frustrated with the extent of the withdrawal.  Endorsed aches, pains, anxiety.  She was involved in an issue with  another patient, but according to people who witnessed the interaction,  it was not her who started the argument.  By June 15, she was better, in  full contact with reality, no active suicidal or homicidal ideas, no  hallucinations or delusions.  The withdrawal symptoms under better  control.  She was going to go to [facility], looking forward to this.  So we  went ahead and discharged to outpatient followup.   DISCHARGE DIAGNOSES:  AXIS I:  Opiate dependence, cocaine abuse, mood  disorder not otherwise specified.  AXIS II:  No diagnosis.  AXIS III:  No diagnosis.  AXIS IV:  Moderate.  AXIS V:  Upon discharge 50-55.   Discharged to [facility].   DISCHARGE MEDICATIONS:  1. Seroquel XR 150 at bedtime.  2. Neurontin 300 four times a day.  3. Symmetrel 100  twice a day.  4. Ambien 10 at bedtime for sleep.   FOLLOWUP:  Followup through [facility] in [place].      Geoffery Lyons, M.D.  Electronically Signed     IL/MEDQ  D:  05/14/2008  T:  05/14/2008  Job:  [id number]

## 2011-05-29 ENCOUNTER — Emergency Department (HOSPITAL_COMMUNITY): Payer: Medicaid Other

## 2011-05-29 ENCOUNTER — Emergency Department (HOSPITAL_COMMUNITY)
Admission: EM | Admit: 2011-05-29 | Discharge: 2011-05-29 | Disposition: A | Discharge status: Home / Self Care | Payer: Medicaid Other | Attending: Emergency Medicine | Admitting: Emergency Medicine

## 2011-05-29 DIAGNOSIS — R609 Edema, unspecified: Secondary | ICD-10-CM | POA: Insufficient documentation

## 2011-05-29 DIAGNOSIS — R1310 Dysphagia, unspecified: Secondary | ICD-10-CM | POA: Insufficient documentation

## 2011-05-29 DIAGNOSIS — D649 Anemia, unspecified: Secondary | ICD-10-CM | POA: Insufficient documentation

## 2011-05-29 DIAGNOSIS — M7989 Other specified soft tissue disorders: Secondary | ICD-10-CM | POA: Insufficient documentation

## 2011-05-29 LAB — CBC
HCT: 32.9 % — ABNORMAL LOW (ref 36.0–46.0)
Hemoglobin: 10.7 g/dL — ABNORMAL LOW (ref 12.0–15.0)
MCV: 96.2 fL (ref 78.0–100.0)
RDW: 13.4 % (ref 11.5–15.5)
WBC: 10.2 10*3/uL (ref 4.0–10.5)

## 2011-05-29 LAB — BASIC METABOLIC PANEL
BUN: 9 mg/dL (ref 6–23)
CO2: 30 mEq/L (ref 19–32)
Chloride: 98 mEq/L (ref 96–112)
Glucose, Bld: 105 mg/dL — ABNORMAL HIGH (ref 70–99)
Potassium: 4.1 mEq/L (ref 3.5–5.1)
Sodium: 134 mEq/L — ABNORMAL LOW (ref 135–145)

## 2011-05-29 LAB — DIFFERENTIAL
Eosinophils Relative: 1 % (ref 0–5)
Lymphocytes Relative: 31 % (ref 12–46)
Lymphs Abs: 3.1 10*3/uL (ref 0.7–4.0)
Neutro Abs: 6.2 10*3/uL (ref 1.7–7.7)

## 2011-07-22 LAB — CBC
HCT: 37.7
Hemoglobin: 12.6
MCHC: 33.5
MCV: 88.1
RBC: 4.28
WBC: 9.4

## 2011-07-22 LAB — URINALYSIS, ROUTINE W REFLEX MICROSCOPIC
Bilirubin Urine: NEGATIVE
Glucose, UA: NEGATIVE
Hgb urine dipstick: NEGATIVE
Specific Gravity, Urine: 1.013
pH: 5.5

## 2011-07-22 LAB — COMPREHENSIVE METABOLIC PANEL
AST: 31
CO2: 23
Calcium: 9.4
Chloride: 105
Creatinine, Ser: 0.56
GFR calc non Af Amer: >60
Glucose, Bld: 103 — ABNORMAL HIGH
Total Bilirubin: 0.6

## 2011-07-22 LAB — DIFFERENTIAL
Basophils Absolute: 0.1
Eosinophils Absolute: 0.1
Eosinophils Relative: 1
Lymphocytes Relative: 27
Lymphs Abs: 2.5
Neutrophils Relative %: 65

## 2011-07-22 LAB — LIPASE, BLOOD: Lipase: 60 — ABNORMAL HIGH

## 2011-07-22 LAB — POCT CARDIAC MARKERS: Myoglobin, poc: 29.7

## 2011-07-28 LAB — POCT I-STAT, CHEM 8
BUN: 7
Creatinine, Ser: 0.8
Hemoglobin: 14.3
Potassium: 3.6
Sodium: 138

## 2011-07-28 LAB — RAPID URINE DRUG SCREEN, HOSP PERFORMED
Amphetamines: NOT DETECTED
Benzodiazepines: NOT DETECTED
Tetrahydrocannabinol: NOT DETECTED

## 2011-07-28 LAB — HEPATIC FUNCTION PANEL
ALT: 17
AST: 14
Albumin: 3.9
Albumin: 4
Alkaline Phosphatase: 61
Alkaline Phosphatase: 69
Total Bilirubin: 0.4
Total Protein: 7.4

## 2011-07-28 LAB — DIFFERENTIAL
Lymphocytes Relative: 22
Lymphs Abs: 2.5
Neutro Abs: 8.1 — ABNORMAL HIGH
Neutrophils Relative %: 71

## 2011-07-28 LAB — CBC
Platelets: 482 — ABNORMAL HIGH
WBC: 11.3 — ABNORMAL HIGH

## 2011-07-28 LAB — TSH: TSH: 1.277

## 2011-07-28 LAB — ACETAMINOPHEN LEVEL: Acetaminophen (Tylenol), Serum: <10 — ABNORMAL LOW

## 2011-07-28 LAB — ETHANOL: Alcohol, Ethyl (B): <5

## 2011-08-01 LAB — POCT PREGNANCY, URINE: Preg Test, Ur: NEGATIVE

## 2011-08-01 LAB — URINALYSIS, ROUTINE W REFLEX MICROSCOPIC
Bilirubin Urine: NEGATIVE
Nitrite: NEGATIVE
Specific Gravity, Urine: 1.035 — ABNORMAL HIGH
pH: 5.5

## 2012-03-31 ENCOUNTER — Encounter (HOSPITAL_COMMUNITY): Payer: Self-pay | Admitting: *Deleted

## 2012-03-31 ENCOUNTER — Emergency Department (HOSPITAL_COMMUNITY)
Admission: EM | Admit: 2012-03-31 | Discharge: 2012-04-01 | Disposition: A | Discharge status: Home / Self Care | Payer: Self-pay | Attending: Emergency Medicine | Admitting: Emergency Medicine

## 2012-03-31 DIAGNOSIS — Z720 Tobacco use: Secondary | ICD-10-CM

## 2012-03-31 DIAGNOSIS — F17200 Nicotine dependence, unspecified, uncomplicated: Secondary | ICD-10-CM | POA: Insufficient documentation

## 2012-03-31 DIAGNOSIS — J069 Acute upper respiratory infection, unspecified: Secondary | ICD-10-CM | POA: Insufficient documentation

## 2012-03-31 DIAGNOSIS — Z79899 Other long term (current) drug therapy: Secondary | ICD-10-CM | POA: Insufficient documentation

## 2012-03-31 NOTE — ED Notes (Signed)
Pt called 10 minutes go she did not answer

## 2012-03-31 NOTE — ED Notes (Signed)
Cold for 2 weeks earache and nasal congestion.  Lt ear pain

## 2012-04-01 MED ORDER — FLUTICASONE PROPIONATE 50 MCG/ACT NA SUSP: 2.0000 | Freq: Every day | NASAL | Status: DC

## 2012-04-01 MED ORDER — AMOXICILLIN 500 MG PO CAPS: 500.0000 mg | ORAL_CAPSULE | Freq: Three times a day (TID) | ORAL | Status: AC

## 2012-04-01 NOTE — ED Provider Notes (Signed)
Medical screening examination/treatment/procedure(s) were performed by non-physician practitioner and as supervising physician I was immediately available for consultation/collaboration.    Jonas Goh D Naithen Rivenburg, MD 04/01/12 0733 

## 2012-04-01 NOTE — Discharge Instructions (Signed)
Upper Respiratory Infection, Adult An upper respiratory infection (URI) is also sometimes known as the common cold. The upper respiratory tract includes the nose, sinuses, throat, trachea, and bronchi. Bronchi are the airways leading to the lungs. Most people improve within 1 week, but symptoms can last up to 2 weeks. A residual cough may last even longer.  CAUSES Many different viruses can infect the tissues lining the upper respiratory tract. The tissues become irritated and inflamed and often become very moist. Mucus production is also common. A cold is contagious. You can easily spread the virus to others by oral contact. This includes kissing, sharing a glass, coughing, or sneezing. Touching your mouth or nose and then touching a surface, which is then touched by another person, can also spread the virus. SYMPTOMS  Symptoms typically develop 1 to 3 days after you come in contact with a cold virus. Symptoms vary from person to person. They may include:  Runny nose.   Sneezing.   Nasal congestion.   Sinus irritation.   Sore throat.   Loss of voice (laryngitis).   Cough.   Fatigue.   Muscle aches.   Loss of appetite.   Headache.   Low-grade fever.  DIAGNOSIS  You might diagnose your own cold based on familiar symptoms, since most people get a cold 2 to 3 times a year. Your caregiver can confirm this based on your exam. Most importantly, your caregiver can check that your symptoms are not due to another disease such as strep throat, sinusitis, pneumonia, asthma, or epiglottitis. Blood tests, throat tests, and X-rays are not necessary to diagnose a common cold, but they may sometimes be helpful in excluding other more serious diseases. Your caregiver will decide if any further tests are required. RISKS AND COMPLICATIONS  You may be at risk for a more severe case of the common cold if you smoke cigarettes, have chronic heart disease (such as heart failure) or lung disease (such as  asthma), or if you have a weakened immune system. The very young and very old are also at risk for more serious infections. Bacterial sinusitis, middle ear infections, and bacterial pneumonia can complicate the common cold. The common cold can worsen asthma and chronic obstructive pulmonary disease (COPD). Sometimes, these complications can require emergency medical care and may be life-threatening. PREVENTION  The best way to protect against getting a cold is to practice good hygiene. Avoid oral or hand contact with people with cold symptoms. Wash your hands often if contact occurs. There is no clear evidence that vitamin C, vitamin E, echinacea, or exercise reduces the chance of developing a cold. However, it is always recommended to get plenty of rest and practice good nutrition. TREATMENT  Treatment is directed at relieving symptoms. There is no cure. Antibiotics are not effective, because the infection is caused by a virus, not by bacteria. Treatment may include:  Increased fluid intake. Sports drinks offer valuable electrolytes, sugars, and fluids.   Breathing heated mist or steam (vaporizer or shower).   Eating chicken soup or other clear broths, and maintaining good nutrition.   Getting plenty of rest.   Using gargles or lozenges for comfort.   Controlling fevers with ibuprofen or acetaminophen as directed by your caregiver.   Increasing usage of your inhaler if you have asthma.  Zinc gel and zinc lozenges, taken in the first 24 hours of the common cold, can shorten the duration and lessen the severity of symptoms. Pain medicines may help with fever, muscle   aches, and throat pain. A variety of non-prescription medicines are available to treat congestion and runny nose. Your caregiver can make recommendations and may suggest nasal or lung inhalers for other symptoms.  HOME CARE INSTRUCTIONS   Only take over-the-counter or prescription medicines for pain, discomfort, or fever as directed  by your caregiver.   Use a warm mist humidifier or inhale steam from a shower to increase air moisture. This may keep secretions moist and make it easier to breathe.   Drink enough water and fluids to keep your urine clear or pale yellow.   Rest as needed.   Return to work when your temperature has returned to normal or as your caregiver advises. You may need to stay home longer to avoid infecting others. You can also use a face mask and careful hand washing to prevent spread of the virus.  SEEK MEDICAL CARE IF:   After the first few days, you feel you are getting worse rather than better.   You need your caregiver's advice about medicines to control symptoms.   You develop chills, worsening shortness of breath, or brown or red sputum. These may be signs of pneumonia.   You develop yellow or brown nasal discharge or pain in the face, especially when you bend forward. These may be signs of sinusitis.   You develop a fever, swollen neck glands, pain with swallowing, or white areas in the back of your throat. These may be signs of strep throat.  SEEK IMMEDIATE MEDICAL CARE IF:   You have a fever.   You develop severe or persistent headache, ear pain, sinus pain, or chest pain.   You develop wheezing, a prolonged cough, cough up blood, or have a change in your usual mucus (if you have chronic lung disease).   You develop sore muscles or a stiff neck.  Document Released: 04/12/2001 Document Revised: 10/06/2011 Document Reviewed: 02/18/2011 [facility] Patient Information 2012 [place], [place].  You Can Quit Smoking If you are ready to quit smoking or are thinking about it, congratulations! You have chosen to help yourself be healthier and live longer! There are lots of different ways to quit smoking. Nicotine gum, nicotine patches, a nicotine inhaler, or nicotine nasal spray can help with physical craving. Hypnosis, support groups, and medicines help break the habit of smoking. TIPS TO GET  OFF AND STAY OFF CIGARETTES  Learn to predict your moods. Do not let a bad situation be your excuse to have a cigarette. Some situations in your life might tempt you to have a cigarette.   Ask friends and co-workers not to smoke around you.   Make your home smoke-free.   Never have "just one" cigarette. It leads to wanting another and another. Remind yourself of your decision to quit.   On a card, make a list of your reasons for not smoking. Read it at least the same number of times a day as you have a cigarette. Tell yourself everyday, "I do not want to smoke. I choose not to smoke."   Ask someone at home or work to help you with your plan to quit smoking.   Have something planned after you eat or have a cup of coffee. Take a walk or get other exercise to perk you up. This will help to keep you from overeating.   Try a relaxation exercise to calm you down and decrease your stress. Remember, you may be tense and nervous the first two weeks after you quit. This will pass.  Find new activities to keep your hands busy. Play with a pen, coin, or rubber band. Doodle or draw things on paper.   Brush your teeth right after eating. This will help cut down the craving for the taste of tobacco after meals. You can try mouthwash too.   Try gum, breath mints, or diet candy to keep something in your mouth.  IF YOU SMOKE AND WANT TO QUIT:  Do not stock up on cigarettes. Never buy a carton. Wait until one pack is finished before you buy another.   Never carry cigarettes with you at work or at home.   Keep cigarettes as far away from you as possible. Leave them with someone else.   Never carry matches or a lighter with you.   Ask yourself, "Do I need this cigarette or is this just a reflex?"   Bet with someone that you can quit. Put cigarette money in a piggy bank every morning. If you smoke, you give up the money. If you do not smoke, by the end of the week, you keep the money.   Keep trying.  It takes 21 days to change a habit!   Talk to your doctor about using medicines to help you quit. These include nicotine replacement gum, lozenges, or skin patches.  Document Released: 08/13/2009 Document Revised: 10/06/2011 Document Reviewed: 08/13/2009 [facility] Patient Information 2012 [place], [place].  Smoking Hazards Smoking cigarettes is extremely bad for your health. Tobacco smoke has over 200 known poisons in it. There are over 60 chemicals in tobacco smoke that cause cancer. Some of the chemicals found in cigarette smoke include:   Cyanide.   Benzene.   Formaldehyde.   Methanol (wood alcohol).   Acetylene (fuel used in welding torches).   Ammonia.  Cigarette smoke also contains the poisonous gases nitrogen oxide and carbon monoxide.  Cigarette smokers have an increased risk of many serious medical problems, including:  Lung cancer.   Lung disease (such as pneumonia, bronchitis, and emphysema).   Heart attack and chest pain due to the heart not getting enough oxygen (angina).   Heart disease and peripheral blood vessel disease.   Hypertension.   Stroke.   Oral cancer (cancer of the lip, mouth, or voice box).   Bladder cancer.   Pancreatic cancer.   Cervical cancer.   Pregnancy complications, including premature birth.   Low birthweight babies.   Early menopause.   Lower estrogen level for women.   Infertility.   Facial wrinkles.   Blindness.   Increased risk of broken bones (fractures).   Senile dementia.   Stillbirths and smaller newborn babies, birth defects, and genetic damage to sperm.   Stomach ulcers and internal bleeding.  Children of smokers have an increased risk of the following, because of secondhand smoke exposure:   Sudden infant death syndrome (SIDS).   Respiratory infections.   Lung cancer.   Heart disease.   Ear infections.  Smoking causes approximately:  90% of all lung cancer deaths in men.   80% of all lung  cancer deaths in women.   90% of deaths from chronic obstructive lung disease.  Compared with nonsmokers, smoking increases the risk of:  Coronary heart disease by 2 to 4 times.   Stroke by 2 to 4 times.   Men developing lung cancer by 23 times.   Women developing lung cancer by 13 times.   Dying from chronic obstructive lung diseases by 12 times.  Someone who smokes 2 packs a day loses  about 8 years of his or her life. Even smoking lightly shortens your life expectancy by several years. You can greatly reduce the risk of medical problems for you and your family by stopping now. Smoking is the most preventable cause of death and disease in our society. Within days of quitting smoking, your circulation returns to normal, you decrease the risk of having a heart attack, and your lung capacity improves. There may be some increased phlegm in the first few days after quitting, and it may take months for your lungs to clear up completely. Quitting for 10 years cuts your lung cancer risk to almost that of a nonsmoker. WHY IS SMOKING ADDICTIVE?  Nicotine is the chemical agent in tobacco that is capable of causing addiction or dependence.   When you smoke and inhale, nicotine is absorbed rapidly into the bloodstream through your lungs. Nicotine absorbed through the lungs is capable of creating a powerful addiction. Both inhaled and non-inhaled nicotine may be addictive.   Addiction studies of cigarettes and spit tobacco show that addiction to nicotine occurs mainly during the teen years, when young people begin using tobacco products.  WHAT ARE THE BENEFITS OF QUITTING?  There are many health benefits to quitting smoking.   Likelihood of developing cancer and heart disease decreases. Health improvements are seen almost immediately.   Blood pressure, pulse rate, and breathing patterns start returning to normal soon after quitting.   People who quit may see an improvement in their overall quality of  life.  Some people choose to quit all at once. Other options include nicotine replacement products, such as patches, gum, and nasal sprays. Do not use these products without first checking with your caregiver. QUITTING SMOKING It is not easy to quit smoking. Nicotine is addicting, and longtime habits are hard to change. To start, you can write down all your reasons for quitting, tell your family and friends you want to quit, and ask for their help. Throw your cigarettes away, chew gum or cinnamon sticks, keep your hands busy, and drink extra water or juice. Go for walks and practice deep breathing to relax. Think of all the money you are saving: around $1,000 a year, for the average pack-a-day smoker. Nicotine patches and gum have been shown to improve success at efforts to stop smoking. Zyban (bupropion) is an anti-depressant drug that can be prescribed to reduce nicotine withdrawal symptoms and to suppress the urge to smoke. Smoking is an addiction with both physical and psychological effects. Joining a stop-smoking support group can help you cope with the emotional issues. For more information and advice on programs to stop smoking, call your doctor, your local hospital, or these organizations:  American Lung Association - 1-800-LUNGUSA   American Cancer Society - 1-800-ACS-2345  Document Released: 11/24/2004 Document Revised: 10/06/2011 Document Reviewed: 07/29/2009 [facility] Patient Information 2012 [place], [place].

## 2012-04-01 NOTE — ED Provider Notes (Signed)
History     CSN: [id number]  Arrival date & time 03/31/12  2238   First MD Initiated Contact with Patient 04/01/12 0029      Chief Complaint  Patient presents with  . Otalgia    (Consider location/radiation/quality/duration/timing/severity/associated sxs/prior treatment) Patient is a 46 y.o. female presenting with ear pain. The history is provided by the patient.  Otalgia   patient presents to emergency department complaining of a two-week history of gradual onset nasal congestion, sinus pressure, and left earache. Patient states symptoms have been persistent and unchanging despite using over-the-counter cold remedy such as Mucinex and NyQuil. Patient states that she smokes tobacco but has no known medical problems and takes no medicines on regular basis. She denies fevers, chills, chest pain, cough, or shortness of breath. She denies aggravating or alleviating factors.  History reviewed. No pertinent past medical history.  History reviewed. No pertinent past surgical history.  No family history on file.  History  Substance Use Topics  . Smoking status: Current Everyday Smoker  . Smokeless tobacco: Not on file  . Alcohol Use: No    OB History    Grav Para Term Preterm Abortions TAB SAB Ect Mult Living                  Review of Systems  HENT: Positive for ear pain.   All other systems reviewed and are negative.    Allergies  Review of patient's allergies indicates no known allergies.  Home Medications   Current Outpatient Rx  Name Route Sig Dispense Refill  . NYQUIL COLD & FLU PO Oral Take 1-2 capsules by mouth every 6 (six) hours as needed. Cold and flu symptoms    . IBUPROFEN 200 MG PO TABS Oral Take 400 mg by mouth every 6 (six) hours as needed. pain    . AMOXICILLIN 500 MG PO CAPS Oral Take 1 capsule (500 mg total) by mouth 3 (three) times daily. 21 capsule 0  . FLUTICASONE PROPIONATE 50 MCG/ACT NA SUSP Nasal Place 2 sprays into the nose daily. 16 g 2     BP 142/84  Pulse 81  Temp(Src) 98.3 F (36.8 C) (Oral)  Resp 16  SpO2 95%  Physical Exam  Vitals reviewed. Constitutional: She is oriented to person, place, and time. She appears well-developed and well-nourished. No distress.  HENT:  Head: Normocephalic and atraumatic.  Right Ear: External ear normal.  Left Ear: External ear normal.  Nose: Nose normal.  Mouth/Throat: No oropharyngeal exudate.       Mild erythema of posterior pharynx and tonsils no tonsillar exudate or enlargement. Patent airway. Swallowing secretions well  Eyes: Conjunctivae and EOM are normal. Pupils are equal, round, and reactive to light.  Neck: Normal range of motion. Neck supple.  Cardiovascular: Normal rate, regular rhythm and normal heart sounds.  Exam reveals no gallop and no friction rub.   No murmur heard. Pulmonary/Chest: Effort normal and breath sounds normal. No respiratory distress. She has no wheezes. She has no rales. She exhibits no tenderness.  Abdominal: Soft. She exhibits no distension and no mass. There is no tenderness. There is no rebound and no guarding.  Lymphadenopathy:    She has no cervical adenopathy.  Neurological: She is alert and oriented to person, place, and time. She has normal reflexes.  Skin: Skin is warm and dry. No rash noted. She is not diaphoretic.  Psychiatric: She has a normal mood and affect.    ED Course  Procedures (including critical  care time)  Labs Reviewed - No data to display No results found.   1. URI (upper respiratory infection)   2. Tobacco abuse       MDM  Patient is afebrile and nontoxic-appearing with no acute findings on bilateral ears. TMs clear bilaterally. Spoke at length with patient the need for tobacco cessation. Given her tobacco abuse history in 2 weeks of symptoms will treat with antibiotics. Spoke at length with patient about the need for symptomatic treatment with over-the-counter remedies for her cold and congestion. Patient  voices her understanding is agreeable plan.        [place], [place] 04/01/12 [phone]

## 2012-04-30 ENCOUNTER — Encounter (HOSPITAL_COMMUNITY): Payer: Self-pay | Admitting: *Deleted

## 2012-04-30 ENCOUNTER — Inpatient Hospital Stay (HOSPITAL_COMMUNITY)
Admission: RE | Admit: 2012-04-30 | Discharge: 2012-05-04 | DRG: 897 | Disposition: A | Discharge status: Home / Self Care | Payer: No Typology Code available for payment source | Attending: Psychiatry | Admitting: Psychiatry

## 2012-04-30 DIAGNOSIS — F10939 Alcohol use, unspecified with withdrawal, unspecified: Principal | ICD-10-CM | POA: Diagnosis present

## 2012-04-30 DIAGNOSIS — F10239 Alcohol dependence with withdrawal, unspecified: Principal | ICD-10-CM | POA: Diagnosis present

## 2012-04-30 DIAGNOSIS — F1020 Alcohol dependence, uncomplicated: Secondary | ICD-10-CM | POA: Diagnosis present

## 2012-04-30 DIAGNOSIS — F1120 Opioid dependence, uncomplicated: Secondary | ICD-10-CM

## 2012-04-30 DIAGNOSIS — Z590 Homelessness: Secondary | ICD-10-CM

## 2012-04-30 HISTORY — DX: Mental disorder, not otherwise specified: F99

## 2012-04-30 HISTORY — DX: Major depressive disorder, single episode, unspecified: F32.9

## 2012-04-30 HISTORY — DX: Depression, unspecified: F32.A

## 2012-04-30 MED ORDER — ALUM & MAG HYDROXIDE-SIMETH 200-200-20 MG/5ML PO SUSP: 30.0000 mL | ORAL | Status: DC | PRN

## 2012-04-30 MED ORDER — ACETAMINOPHEN 325 MG PO TABS
650.0000 mg | ORAL_TABLET | Freq: Four times a day (QID) | ORAL | Status: DC | PRN
  Administered 2012-05-02 – 2012-05-03 (×3): 650 mg via ORAL

## 2012-04-30 MED ORDER — TRAZODONE HCL 50 MG PO TABS
50.0000 mg | ORAL_TABLET | Freq: Every evening | ORAL | Status: DC | PRN
  Administered 2012-04-30: 50 mg via ORAL
  Filled 2012-04-30: qty 1

## 2012-04-30 MED ORDER — CLONIDINE HCL 0.1 MG PO TABS
0.1000 mg | ORAL_TABLET | Freq: Four times a day (QID) | ORAL | Status: AC
  Administered 2012-04-30 – 2012-05-03 (×10): 0.1 mg via ORAL
  Filled 2012-04-30 (×11): qty 1

## 2012-04-30 MED ORDER — LOPERAMIDE HCL 2 MG PO CAPS
2.0000 mg | ORAL_CAPSULE | ORAL | Status: DC | PRN
  Administered 2012-05-01 – 2012-05-03 (×5): 2 mg via ORAL
  Filled 2012-04-30 (×2): qty 1

## 2012-04-30 MED ORDER — MAGNESIUM HYDROXIDE 400 MG/5ML PO SUSP: 30.0000 mL | Freq: Every day | ORAL | Status: DC | PRN

## 2012-04-30 MED ORDER — NAPROXEN 500 MG PO TABS
500.0000 mg | ORAL_TABLET | Freq: Two times a day (BID) | ORAL | Status: DC | PRN
  Administered 2012-04-30 – 2012-05-03 (×6): 500 mg via ORAL
  Filled 2012-04-30 (×6): qty 1

## 2012-04-30 MED ORDER — CLONIDINE HCL 0.1 MG PO TABS
0.1000 mg | ORAL_TABLET | Freq: Every day | ORAL | Status: DC
  Filled 2012-04-30: qty 1

## 2012-04-30 MED ORDER — DICYCLOMINE HCL 20 MG PO TABS
20.0000 mg | ORAL_TABLET | Freq: Four times a day (QID) | ORAL | Status: DC | PRN
  Administered 2012-05-02 (×2): 20 mg via ORAL
  Filled 2012-04-30 (×2): qty 1

## 2012-04-30 MED ORDER — HYDROXYZINE HCL 25 MG PO TABS
25.0000 mg | ORAL_TABLET | Freq: Four times a day (QID) | ORAL | Status: DC | PRN
  Administered 2012-04-30 – 2012-05-03 (×3): 25 mg via ORAL
  Filled 2012-04-30: qty 20

## 2012-04-30 MED ORDER — METHOCARBAMOL 500 MG PO TABS
500.0000 mg | ORAL_TABLET | Freq: Three times a day (TID) | ORAL | Status: DC | PRN
  Administered 2012-05-04: 500 mg via ORAL
  Filled 2012-04-30: qty 1

## 2012-04-30 MED ORDER — ONDANSETRON 4 MG PO TBDP
4.0000 mg | ORAL_TABLET | Freq: Four times a day (QID) | ORAL | Status: DC | PRN
  Administered 2012-05-01 – 2012-05-02 (×2): 4 mg via ORAL

## 2012-04-30 MED ORDER — PNEUMOCOCCAL VAC POLYVALENT 25 MCG/0.5ML IJ INJ
0.5000 mL | INJECTION | INTRAMUSCULAR | Status: AC
  Administered 2012-05-02: 0.5 mL via INTRAMUSCULAR

## 2012-04-30 MED ORDER — CLONIDINE HCL 0.1 MG PO TABS
0.1000 mg | ORAL_TABLET | ORAL | Status: DC
  Administered 2012-05-03 – 2012-05-04 (×2): 0.1 mg via ORAL
  Filled 2012-04-30 (×4): qty 1
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
## 2012-04-30 NOTE — BH Assessment (Signed)
Assessment Note   Laura Matthews is an 46 y.o. female. Pt presents to [facility] today with chief complaint of substance use. Pt requesting detox from percoset and vicdon. Pt reports that she has been addicted to "pain pills" since her knee surgery in 1999. Pt reports increase use of pain pills reporting that she uses 20 "pain pills" a day a combination of Vicodin and Percoset. Pt reports that she last used 7 percosets today. Pt reports withdraw symptoms to include-irritability,fever,and stomach discomfort, and keeps verbalizing that she needs pain pills. Pt reports a hx of crack and cocaine abuse. Pt reports her last use of crack/cocaine was about 3-4 years ago. Pt reports that she is currently living with her daughter as she previously lost her home because she did not pay her rent, due to job loss. Pt reports earning unemployement benefits and using her money to support her pain pill habit,along with "boosting"-stealing to support her habit. Pt reports that she served 4 months in jail due to larceny charges(Aug 2012-Dec 2012). Pt presents tearful and reports that she is tired of being "broke" and spending her money on pills. Pt reports " i am sick and need help". Pt states " i have hit rock bottom". Pt denies SI,HI, and no AVH reported. Consulted with AC Thurman Coyer and Dr. Pasty Spillers who agreed to admit pt for inpatient detox treatment.  Axis I: Opioid Dependence Axis II: Deferred Axis III:  Past Medical History  Diagnosis Date  . Mental disorder   . Depression    Axis IV: economic problems, other psychosocial or environmental problems, problems related to legal system/crime, problems related to social environment, problems with access to health care services and problems with primary support group Axis V: 31-40 impairment in reality testing  Past Medical History:  Past Medical History  Diagnosis Date  . Mental disorder   . Depression     Past Surgical History  Procedure Date  .  Knee surgery   . Foot surgery     Family History: History reviewed. No pertinent family history.  Social History:  reports that she has been smoking Cigarettes.  She has been smoking about 1 pack per day. She has never used smokeless tobacco. She reports that she uses illicit drugs (Other-see comments). She reports that she does not drink alcohol.  Additional Social History:  Alcohol / Drug Use Pain Medications: Opiates Prescriptions:  (None Reported) Over the Counter:  (Tylenol PM for sleep) History of alcohol / drug use?: Yes Longest period of sobriety (when/how long): 2 weeks, 2008 Substance #3 Name of Substance 3:  (Percoset-1st use-age-(38),Vicodin-1st use-age(38),Cocaine-32) 3 - Amount (size/oz):  (20> pills of vicodin and percoset daily, No Cocaine use) 3 - Frequency:  (daily) 3 - Duration:  (Years became addicted after knee surgery in 1999) 3 - Last Use / Amount:  (denies cocaine use-last use 3-4 yrs ago,7 percosets today)  CIWA: CIWA-Ar BP: 120/84 mmHg Pulse Rate: 79  Nausea and Vomiting: no nausea and no vomiting Tactile Disturbances: none Tremor: no tremor Auditory Disturbances: not present Paroxysmal Sweats: no sweat visible Visual Disturbances: not present Anxiety: two Headache, Fullness in Head: mild Agitation: somewhat more than normal activity Orientation and Clouding of Sensorium: oriented and can do serial additions CIWA-Ar Total: 5  COWS: Clinical Opiate Withdrawal Scale (COWS) Resting Pulse Rate: Pulse Rate 80 or below Sweating: No report of chills or flushing Restlessness: Able to sit still Pupil Size: Pupils pinned or normal size for room light Bone or  Joint Aches: Mild diffuse discomfort Runny Nose or Tearing: Not present GI Upset: No GI symptoms Tremor: No tremor Yawning: No yawning Anxiety or Irritability: Patient obviously irritable/anxious Gooseflesh Skin: Skin is smooth COWS Total Score: 3   Allergies: No Known Allergies  Home  Medications:  Medications Prior to Admission  Medication Sig Dispense Refill  . DM-Doxylamine-Acetaminophen (NYQUIL COLD & FLU PO) Take 1-2 capsules by mouth every 6 (six) hours as needed. Cold and flu symptoms      . fluticasone (FLONASE) 50 MCG/ACT nasal spray Place 2 sprays into the nose daily.  16 g  2  . ibuprofen (ADVIL,MOTRIN) 200 MG tablet Take 400 mg by mouth every 6 (six) hours as needed. pain        OB/GYN Status:  No LMP recorded. Patient is not currently having periods (Reason: Other).  General Assessment Data Location of Assessment: [facility] Assessment Services Living Arrangements: Children Can pt return to current living arrangement?: Yes Admission Status: Voluntary Is patient capable of signing voluntary admission?: Yes Transfer from: Home Referral Source: Self/Family/Friend     Risk to self Suicidal Ideation: No-Not Currently/Within Last 6 Months Suicidal Intent: No-Not Currently/Within Last 6 Months Is patient at risk for suicide?: No Suicidal Plan?: No Access to Means: No What has been your use of drugs/alcohol within the last 12 months?: Opiods-Percoset and Vicodin Previous Attempts/Gestures: Yes How many times?: 1  (overdose on pain pills in 1999) Other Self Harm Risks: none Triggers for Past Attempts: Other (Comment) (Abusing Pain Pills-Addiction) Intentional Self Injurious Behavior: None Family Suicide History: No Recent stressful life event(s): Job Loss;Financial Problems;Legal Issues Persecutory voices/beliefs?: No Depression: Yes Depression Symptoms: Tearfulness;Isolating;Fatigue;Loss of interest in usual pleasures;Feeling angry/irritable Substance abuse history and/or treatment for substance abuse?: Yes Suicide prevention information given to non-admitted patients: Not applicable  Risk to Others Homicidal Ideation: No Thoughts of Harm to Others: No Current Homicidal Intent: No Current Homicidal Plan: No Access to Homicidal Means: No Identified  Victim: na History of harm to others?: No Assessment of Violence: None Noted Violent Behavior Description: No Hx of violence-Currently Calm,Cooperative,Tearful Does patient have access to weapons?: No Criminal Charges Pending?: Yes (was in jail for larceny for 4 months(aug 2012-Dec 2012)) Describe Pending Criminal Charges: none reported Does patient have a court date: No  Psychosis Hallucinations: None noted Delusions: None noted  Mental Status Report Appear/Hygiene: Other (Comment) (appropriate) Eye Contact: Good Motor Activity: Freedom of movement Speech: Aggressive Level of Consciousness: Alert;Crying Mood: Anxious Affect: Anxious;Blunted Anxiety Level: Minimal Thought Processes: Coherent;Relevant Judgement: Impaired Orientation: Person;Place;Time;Situation  Cognitive Functioning Concentration: Normal Memory: Recent Intact;Remote Intact IQ: Average Insight: Good Impulse Control: Poor Appetite: Poor Sleep: Decreased Total Hours of Sleep: 4  Vegetative Symptoms: None  ADLScreening [facility] Assessment Services) Patient's cognitive ability adequate to safely complete daily activities?: Yes Patient able to express need for assistance with ADLs?: Yes Independently performs ADLs?: Yes  Abuse/Neglect [facility]) Physical Abuse: Denies Verbal Abuse: Denies Sexual Abuse: Denies  Prior Inpatient Therapy Prior Inpatient Therapy: Yes Prior Therapy Dates: 2009? Prior Therapy Facilty/Provider(s): [facility] Reason for Treatment: Detox  Prior Outpatient Therapy Prior Outpatient Therapy: No Prior Therapy Dates: na Prior Therapy Facilty/Provider(s): na Reason for Treatment: na  ADL Screening (condition at time of admission) Patient's cognitive ability adequate to safely complete daily activities?: Yes Patient able to express need for assistance with ADLs?: Yes Independently performs ADLs?: Yes Weakness of Legs: None Weakness of Arms/Hands: None  Home Assistive  Devices/Equipment Home Assistive Devices/Equipment: None  Therapy Consults (therapy consults  require a physician order) PT Evaluation Needed: No OT Evalulation Needed: No SLP Evaluation Needed: No Abuse/Neglect Assessment (Assessment to be complete while patient is alone) Physical Abuse: Denies Verbal Abuse: Denies Sexual Abuse: Denies Exploitation of patient/patient's resources: Denies Self-Neglect: Denies Values / Beliefs Cultural Requests During Hospitalization: None Spiritual Requests During Hospitalization: None Consults Spiritual Care Consult Needed: No Social Work Consult Needed: No Merchant navy officer (For Healthcare) Advance Directive: Patient does not have advance directive;Patient would not like information Pre-existing out of facility DNR order (yellow form or pink MOST form): No Nutrition Screen Diet: Regular Unintentional weight loss greater than 10lbs within the last month: No Problems chewing or swallowing foods and/or liquids: No Home Tube Feeding or Total Parenteral Nutrition (TPN): No Patient appears severely malnourished: No Pregnant or Lactating: No  Additional Information 1:1 In Past 12 Months?: No CIRT Risk: No Elopement Risk: No Does patient have medical clearance?: No     Disposition:  Disposition Disposition of Patient: Inpatient treatment program (pt accepted to [facility] for detox inpatient treatment) Type of inpatient treatment program: Adult (Pt accepted by Dr.Kuroski [facility])  On Site Evaluation by:   Reviewed with Physician:     Bjorn Pippin 04/30/2012 8:20 PM

## 2012-04-30 NOTE — Tx Team (Signed)
Initial Interdisciplinary Treatment Plan  PATIENT STRENGTHS: (choose at least two) Ability for insight Average or above average intelligence Communication skills General fund of knowledge Motivation for treatment/growth  PATIENT STRESSORS: Substance abuse   PROBLEM LIST: Problem List/Patient Goals Date to be addressed Date deferred Reason deferred Estimated date of resolution  Substance Abuse                                                       DISCHARGE CRITERIA:  Improved stabilization in mood, thinking, and/or behavior  PRELIMINARY DISCHARGE PLAN: Outpatient therapy  PATIENT/FAMIILY INVOLVEMENT: This treatment plan has been presented to and reviewed with the patient, Laura Matthews, and/or family member.  The patient and family have been given the opportunity to ask questions and make suggestions.  Laura Matthews [facility] 04/30/2012, 7:57 PM

## 2012-05-01 DIAGNOSIS — F1120 Opioid dependence, uncomplicated: Secondary | ICD-10-CM

## 2012-05-01 DIAGNOSIS — Z590 Homelessness: Secondary | ICD-10-CM

## 2012-05-01 LAB — PREGNANCY, URINE: Preg Test, Ur: NEGATIVE

## 2012-05-01 LAB — URINALYSIS, ROUTINE W REFLEX MICROSCOPIC
Hgb urine dipstick: NEGATIVE
Ketones, ur: NEGATIVE mg/dL
Protein, ur: NEGATIVE mg/dL
Urobilinogen, UA: 1 mg/dL (ref 0.0–1.0)

## 2012-05-01 LAB — URINE MICROSCOPIC-ADD ON

## 2012-05-01 LAB — RAPID URINE DRUG SCREEN, HOSP PERFORMED
Amphetamines: NOT DETECTED
Barbiturates: NOT DETECTED
Tetrahydrocannabinol: NOT DETECTED

## 2012-05-01 MED ORDER — TRAZODONE HCL 100 MG PO TABS
100.0000 mg | ORAL_TABLET | Freq: Every evening | ORAL | Status: DC | PRN
  Administered 2012-05-01 – 2012-05-03 (×3): 100 mg via ORAL
  Filled 2012-05-01 (×3): qty 1
  Filled 2012-05-01: qty 14

## 2012-05-01 NOTE — Progress Notes (Signed)
Psychoeducational Group Note  Date:  05/01/2012 Time:  1100  Group Topic/Focus:  Recovery Goals:   The focus of this group is to identify appropriate goals for recovery and establish a plan to achieve them.  Participation Level: Did Not Attend  Participation Quality:  Not Applicable  Affect:  Not Applicable  Cognitive:  Not Applicable  Insight:  Not Applicable  Engagement in Group:  Not Applicable  Additional Comments:  Pt. Did not feel well enough to attend the Group.  Christ Kick 05/01/2012, 2:09 PM

## 2012-05-01 NOTE — Progress Notes (Signed)
[facility] Group Notes:  (Counselor/Nursing/MHT/Case Management/Adjunct)  05/01/2012 1:46 PM  Type of Therapy:  Psychoeducational Skills  Participation Level:  Did Not Attend   Summary of Progress/Problems: Laura Matthews c/o feeling sick and did not attend Psychoeducation group on labels.    Wandra Scot 05/01/2012, 1:46 PM

## 2012-05-01 NOTE — [facility] Suicide Risk Assessment (Signed)
Suicide Risk Assessment  Admission Assessment      Demographic factors:  See chart.  Current Mental Status: Patient seen and evaluated. Chart reviewed. Patient stated that her mood was "ok".  Sig w/d s/s noted.  Last use of opiates yesterday.  Her affect was mood congruent and mildly irritable. She denied any current thoughts of self injurious behavior, suicidal ideation or homicidal ideation. There were no auditory or visual hallucinations, paranoia, delusional thought processes, or mania noted.  Thought process was linear and goal directed.  No psychomotor agitation or retardation was noted. Speech was normal rate, tone and volume. Eye contact was good. Judgment and insight are fair.  Patient has been up and engaged on the unit.  No acute safety concerns reported from team.    Loss Factors: Decrease in vocational status;Financial problems / change in socioeconomic status  Historical Factors: Sig opioid addiction; denied all other SUDs  Risk Reduction Factors:  Sense of responsibility to family;Religious beliefs about death;Living with another person, especially a relative  CLINICAL FACTORS: Opioid Dependence & W/D; SIMD  COGNITIVE FEATURES THAT CONTRIBUTE TO RISK: none.  SUICIDE RISK: Pt viewed as a chronic moderate increased risk of harm to self in light of her past hx and risk factors.  No acute safety concerns are noted on the unit.  Pt contracting for safety and in need of crisis stabilization, detox & Tx.  PLAN OF CARE: Pt admitted for crisis stabilization, detox off opiates and treatment.  Please see orders. Trazodone initiated for sleep.  Medications reviewed with pt and medication education provided. Will continue q15 minute checks per unit protocol.  No clinical indication for one on one level of observation at this time.  Pt contracting for safety.  Mental health treatment, medication management and continued sobriety will mitigate against the increased risk of harm to self and/or  others.  Discussed the importance of recovery with pt, as well as, tools to move forward in a healthy & safe manner.  Pt agreeable with the plan.  Discussed with the team.   Lupe Carney 05/01/2012, 4:25 PM

## 2012-05-01 NOTE — Progress Notes (Signed)
D:  Patient reports that she slept poorly and her appetite is poor.  Her energy level is low and her ability to pay attention is poor.  She is rating hopelessness at 10/10.  She complained of nausea andvomiting this morning and diarrhea in the afternoon.  She is also experiencing muscle aches.  She has been tired and has not attended groups today.  She did go to lunch with other patients, but could only tolerate a piece of bread. A:  Administered Zofran for nausea, Immodium for diarrhea, and Naproxen for muscle aches.  Safety checks q 15 minutes.  Provided emotional support and encouragement.  Continued to monitor for signs and symptoms of withdrawal. R:  Patient was resting in bed after medications.  Safety maintained on unit.

## 2012-05-01 NOTE — Progress Notes (Signed)
Recreation Therapy Group Note  Date: 05/01/2012          Time: 1500      Group Topic/Focus: The focus of this group is on enhancing the patient's understanding of leisure, barriers to leisure, and the importance of engaging in positive leisure activities upon discharge for improved total health.   Participation Level: Did not attend  Participation Quality: Not Applicable  Affect: Not Applicable  Cognitive: Not Applicable   Additional Comments: Patient in bed, excused from group due to withdrawal symptoms.    Emon Miggins 05/01/2012 4:36 PM 

## 2012-05-01 NOTE — Progress Notes (Signed)
D: Pt denies SI/HI/AV. Pt is pleasant and cooperative. Pt complains of nausea, generalized pain, and anxiety. Pt did not attend the afternoon group as she stated she was not feeling good and did not want to go outside.   A: Pt was offered support and encouragement. Pt was given scheduled medications. Pt was encourage to attend evening group. Q 15 minute checks were done for safety.  R:Pt has not been attending groups but interacts well with peers and staff. Pt is taking medication. Pt has no complaints other than her withdrawing symtoms.Pt safety maintained on unit.
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
## 2012-05-01 NOTE — Progress Notes (Signed)
[facility] Group Notes:  (Counselor/Nursing/MHT/Case Management/Adjunct)  05/01/2012 3:25 PM  Type of Therapy:  Group Therapy from 1:15 to 2:30 PM  Participation Level:  Did Not Attend  Clide Dales 05/01/2012, 3:25 PM

## 2012-05-02 MED ORDER — NICOTINE POLACRILEX 2 MG MT GUM
2.0000 mg | CHEWING_GUM | OROMUCOSAL | Status: DC | PRN
  Administered 2012-05-02 – 2012-05-04 (×3): 2 mg via ORAL

## 2012-05-02 NOTE — Progress Notes (Signed)
Patient ID: Laura Matthews, female   DOB: [birth year], 46 y.o.   MRN: [id number]  Laura Matthews 46 y.o. female Opiate dependence  Subjective: Pt. Notes that she has had diarrhea this morning, the vomiting has resolved.  She was afraid to eat due to the withdrawal. She is taking the Imodium, and this has helped.  Laura Matthews is currently filled with self doubt about her ability to maintain her sobriety off of drugs.  She does acknowledge feeling better waking up sober, and not having to worry about locating pills to keep from getting sick. She can acknowledge that she wants to be sober and is making a good choice for her health.  She does accept that this is a daily struggle and she is anxious about moving forward.    BP 107/68  Pulse 84  Temp 98 F (36.7 C) (Oral)  Resp 17  Ht 5\' 9"  (1.753 m)  Wt 82.101 kg (181 lb)  BMI 26.73 kg/m2 Scheduled Meds:   . cloNIDine  0.1 mg Oral QID   Followed by  . cloNIDine  0.1 mg Oral BH-qamhs   Followed by  . cloNIDine  0.1 mg Oral QAC breakfast  . pneumococcal 23 valent vaccine  0.5 mL Intramuscular Tomorrow-1000   Continuous Infusions:  PRN Meds:.acetaminophen, alum & mag hydroxide-simeth, dicyclomine, hydrOXYzine, loperamide, magnesium hydroxide, methocarbamol, naproxen, ondansetron, traZODone  Objective:     Assessment/Plan: Dx unchanged  Continue current plan of care     Laura Matthews, PAC  05/02/2012, 5:01 PM

## 2012-05-02 NOTE — Progress Notes (Signed)
D  Pt in bed resting with eyes closed  No distress noted   Respirations are even and unlabored  A   Will continue to monitor and Q 15 min checks R   Pt remains safe

## 2012-05-02 NOTE — Progress Notes (Signed)
D:  Patient reports sleeping fair and that her appetite is poor.  Her energy level is low.  She rates hopelessness at 8/10.  She continues to complain of withdrawal symptoms of muscle aches, nausea and diarrhea.  She is out of bed today and participating in groups.  She is interacting appropriately with staff and other patients.   A:  Zofran given for nausea and Immodium given for diarrhea.  Naproxen given for muscle aches. Continue to monitor for signs and symptoms of withdrawal. Emotional support and encouragement given.  Safety checks q 15 minutes. R:  Patient reports improvement of symptoms after medication.  Safety maintained on unit.

## 2012-05-02 NOTE — Progress Notes (Signed)
[facility] Group Notes:  (Counselor/Nursing/MHT/Case Management/Adjunct)  05/02/2012 2:16 PM  Type of Therapy:  Psychoeducational Skills  Participation Level:  Active  Participation Quality:  Appropriate  Affect:  Appropriate  Cognitive:  Appropriate  Insight:  Good  Engagement in Group:  Good  Engagement in Therapy:  Good  Modes of Intervention:  Support  Summary of Progress/Problems:   Christ Kick 05/02/2012, 2:16 PM

## 2012-05-02 NOTE — Treatment Plan (Signed)
Interdisciplinary Treatment Plan Update (Adult)  Date: 05/02/2012  Time Reviewed: 5:16 PM   Progress in Treatment: Attending groups: No Participating in groups: No Taking medication as prescribed: Yes Tolerating medication: Yes   Family/Significant othre contact made:  no Patient understands diagnosis:  Yes  As evidenced by asking for help with opiate detox and getting into rehab from here Discussing patient identified problems/goals with staff:  Yes  See below Medical problems stabilized or resolved:  Yes Denies suicidal/homicidal ideation: Yes  In AM group Issues/concerns per patient self-inventory:  Yes  Withdrawal symptoms Other:  New problem(s) identified: N/A  Reason for Continuation of Hospitalization: Medication stabilization Withdrawal symptoms  Interventions implemented related to continuation of hospitalization: clonodine taper  Encourage group attendance and participation Additional comments:  Estimated length of stay: 2-3 days  Discharge Plan: get into rehab  New goal(s): N/A  Review of initial/current patient goals per problem list:   1.  Goal(s):Safely detox from opiates   Met:  No  Target date:7/5  As evidenced WG:NFAOZH vitals, COWS score of 2 or less  2.  Goal (s):Get into rehab from here  Met:  No  Target date:7/5  As evidenced YQ:[id number] of bed  3.  Goal(s):  Met:  No  Target date:  As evidenced by:  4.  Goal(s):  Met:  No  Target date:  As evidenced by:  Attendees: Patient:     Family:     Physician:  Lupe Carney 05/02/2012 5:16 PM   Nursing:    05/02/2012 5:16 PM   Case Manager:  Richelle Ito, LCSW 05/02/2012 5:16 PM   Counselor:  05/02/2012 5:16 PM   Other:     Other:     Other:     Other:      Scribe for Treatment Team:   Ida Rogue, 05/02/2012 5:16 PM

## 2012-05-02 NOTE — Discharge Planning (Signed)
New patient attended AM group, good participation.  Has been using opiates regularly for  7 years with one detox break.  Been staying with daughter.  Hasn't worked since May.  Burning bridges.  Hoping to get into rehab from here.  Gave her application for [facility], talked to her about [facility].

## 2012-05-02 NOTE — Progress Notes (Signed)
Psychoeducational Group Note  Date:  05/02/2012 Time: 2256  Group Topic/Focus:  Wrap-Up Group:   The focus of this group is to help patients review their daily goal of treatment and discuss progress on daily workbooks.  Participation Level:  Active  Participation Quality:  Attentive and Supportive  Affect:  Appropriate  Cognitive:  Appropriate  Insight:  Good  Engagement in Group:  Good  Additional Comments:  Pt attended NA group this evening.  Pt concerned that others not in group attendance.  Redirected to work on own treatment goals.  Pt participated in group and supportive of presenters and peers.  Aundria Rud, Stesha Neyens L 05/02/2012, 10:56 PM

## 2012-05-02 NOTE — Progress Notes (Signed)
[facility] Group Notes:  (Counselor/Nursing/MHT/Case Management/Adjunct)  05/02/2012 4:51 PM  Type of Therapy:  Group Therapy at 1:15 to 2:30 PM  Participation Level:  Did Not Attend   Clide Dales 05/02/2012, 4:51 PM

## 2012-05-03 MED ORDER — NAPROXEN 500 MG PO TABS
500.0000 mg | ORAL_TABLET | Freq: Two times a day (BID) | ORAL | Status: DC
  Administered 2012-05-03 – 2012-05-04 (×2): 500 mg via ORAL
  Filled 2012-05-03 (×7): qty 1

## 2012-05-03 MED ORDER — TUBERCULIN PPD 5 UNIT/0.1ML ID SOLN
5.0000 [IU] | Freq: Once | INTRADERMAL | Status: AC
  Administered 2012-05-03: 5 [IU] via INTRADERMAL

## 2012-05-03 NOTE — Progress Notes (Signed)
Patient resting quietly with eyes closed. Respirations even and unlabored. No distress noted. Q 15 minute checks continue to maintain safety.

## 2012-05-03 NOTE — Progress Notes (Signed)
Patient ID: Laura Matthews, female   DOB: 08-16-1966, 46 y.o.   MRN: [id number]  02/05/2012 11:34 AM 3  Diagnosis:  Opiate dependence  ADL's:  Intact  Sleep: Fair  Appetite:  Fair  Suicidal Ideation: no  Homicidal Ideation: no   Subjective: Pt is depressed about leaving soon, and worried about going back to her home environment and getting together with her old cronies. Laura Matthews is working hard to find a placement that will take her out of her old milieu and give her sometime to get some sobriety under her belt.  She is anxious about leaving the safety of the unit and very worried about relapse.  Mental Status Examination/Evaluation: Objective:  Appearance: Fairly Groomed  Patent attorney::  Good  Speech:  Clear and Coherent  Volume:  Normal  Mood:  Anxious  Affect:  Congruent  Thought Process:  Coherent  Orientation:  Full  Thought Content:  WDL  Suicidal Thoughts:  No  Homicidal Thoughts:  No  Memory:  Immediate;   Fair  Judgement:  Impaired  Insight:  Present  Psychomotor Activity:  Normal  Concentration:  Fair  Recall:  Fair  Akathisia:  No  Handed:  Right  AIMS (if indicated):     Assets:  Communication Skills Desire for Improvement Physical Health Resilience Talents/Skills  Sleep:  Number of Hours: 6.25    Vital Signs: BP 103/69  Pulse 86  Temp 97.5 F (36.4 C) (Oral)  Resp 18  Ht 5\' 9"  (1.753 m)  Wt 82.101 kg (181 lb)  BMI 26.73 kg/m2'  Current Medications: Current Facility-Administered Medications   Lab Results: No results found for this or any previous visit (from the past 48 hour(s)).  Physical Findings: AIMS:   CIWA:  CIWA-Ar Total: 0  COWS:     Treatment Plan Summary:  1. Daily contact with patient to assess and evaluate symptoms and progress in treatment.  2. Medication management  3. The patient will deny suicidal ideations or homicidal ideations for 48 hours prior to discharge and have a depression and anxiety rating of 3 or less. The patient  will also deny any auditory or visual hallucinations or delusional thinking.  4. The patient will deny any symptoms of substance withdrawal at time of discharge.   Plan:  1. Medications, VS, Chart and labs all reviewed. Patient evaluated.  2. Pt. Is given several coping skills to use when she is overwhelmed with guilt and indecision.  3. She is also encouraged to continue looking for the placement that will meet her needs.  Laura Matthews. Laura Matthews [facility] 05/03/2012

## 2012-05-03 NOTE — Progress Notes (Signed)
Psychoeducational Group Note  Date:  05/03/2012 Time:  11:00  Group Topic/Focus:  Overcoming Stress:   The focus of this group is to define stress and help patients assess their triggers.  Participation Level:  Active  Participation Quality:  Appropriate and Attentive  Affect:  Depressed and Irritable  Cognitive:  Alert and Appropriate  Insight:  Good  Engagement in Group:  Good  Additional Comments:  Pt's main stressor is not knowing where she will go after discharge.  She also shared that she would have to make sure she has support so as not to relapse.  Pt stated she likes to watch TV to reduce stress.  Gwyndolyn Kaufman 05/03/2012, 2:32 PM

## 2012-05-03 NOTE — Progress Notes (Signed)
[facility] Family/Significant Other Suicide Prevention Education  Suicide Prevention Education:  Patient Refusal for Family/Significant Other Suicide Prevention Education: The patient Laura Matthews has refused to provide written consent for family/significant other to be provided Family/Significant Other Suicide Prevention Education during admission and/or prior to discharge.  Physician notified.  Plan is for Patient to  Discharge to Other Healthcare Facility:  Suicide Prevention Education Not Provided: {PT. DISCHARGED TO OTHER HEALTHCARE FACILITY:SUICIDE PREVENTION EDUCATION NOT PROVIDED (CHL):  The patient is discharging to another healthcare facility for continuation of treatment.  The patient's medical information, including suicide ideations and risk factors, are a part of the medical information shared with the receiving healthcare facility.  Writer provided suicide prevention education directly to patient; conversation at 8:30 AM today included risk factors, warning signs and resources to contact for help. Mobile crisis services explained and contact card placed in chart for pt to receive at discharge.   Clide Dales 05/03/2012, 11:33 AM
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
## 2012-05-03 NOTE — Progress Notes (Signed)
D: Patient in bed most of AM. She did rise and attend AA group. Complaints of loose stools and stomach upset along back ache.  A: PRNs given.cPatient encouraged to attend groups and to increase fluid intake. R: Patient irritable. "Not feeling well". Joice Lofts RN MS EdS 05/03/2012  10:39 AM

## 2012-05-04 MED ORDER — HYDROXYZINE HCL 25 MG PO TABS: 25.0000 mg | ORAL_TABLET | Freq: Four times a day (QID) | ORAL | Status: AC | PRN

## 2012-05-04 MED ORDER — TRAZODONE HCL 100 MG PO TABS: 100.0000 mg | ORAL_TABLET | Freq: Every evening | ORAL | Status: DC | PRN

## 2012-05-04 NOTE — Progress Notes (Signed)
Patient ID: Laura Matthews, female   DOB: 10-Jul-1966, 46 y.o.   MRN: [id number] 05-04-12 nursing discharge note: pt stated she is ready for discharge. She got her belongings, scripts and sample medications. She stated she understood her d/c instructions. She was escorted to the lobby. [facility]. Their personnel will transport the patient.

## 2012-05-04 NOTE — [facility] Suicide Risk Assessment (Signed)
Suicide Risk Assessment  Discharge Assessment      Demographic factors: Unemployed;Low socioeconomic status  Current Mental Status Per Nursing Assessment: At Discharge: Pt denied any SI/HI/thoughts of self harm or acute psychiatric issues.   Current Mental Status Per Physician: Patient seen and evaluated. Chart reviewed. Patient stated that her mood was "much better".  Excited about going to [place]. Her affect was mood congruent and euthymic. She denied any current thoughts of self injurious behavior, suicidal ideation or homicidal ideation. There were no auditory or visual hallucinations, paranoia, delusional thought processes, or mania noted.  Thought process was linear and goal directed.  No psychomotor agitation or retardation was noted. Speech was normal rate, tone and volume. Eye contact was good. Judgment and insight are fair.  Patient has been up and engaged on the unit.  No acute safety concerns reported from team.    Loss Factors: Decrease in vocational status;Financial problems / change in socioeconomic status  Historical Factors: Sig opioid addiction; denied all other SUDs  Risk Reduction Factors:  Sense of responsibility to family;Religious beliefs about death;Living with another person, especially a relative  Discharge Diagnoses: Opioid Dependence; SIMD, resolving   Cognitive Features That Contribute To Risk: none.  Suicide Risk: Pt viewed as a chronic moderate increased risk of harm to self in light of her past hx and risk factors.  No acute safety concerns are noted on the unit.  Pt contracting for safety and is stable for transfer to [place].  Plan Of Care/Follow-up recommendations: Pt seen and evaluated in treatment team with CM. Chart reviewed.  Pt stable for and requesting discharge to [facility]. Pt contracting for safety and does not currently meet [facility] involuntary commitment criteria for continued hospitalization against her will.  Mental health treatment, medication management and  continued sobriety will mitigate against the potential increased risk of harm to self and/or others.  Discussed the importance of recovery further with pt, as well as, tools to move forward in a healthy & safe manner.  Pt agreeable with the plan.  Discussed with the team.  Please see orders, follow up appointments per AVS and full discharge summary to be completed by physician extender.  Recommend follow up with NA.  Diet: Regular.  Activity: As tolerated.     Lupe Carney 05/04/2012, 11:44 AM

## 2012-05-04 NOTE — Discharge Summary (Signed)
Physician Discharge Summary Note  Patient:  Laura Matthews is an 46 y.o., female MRN:  [id number] DOB:  [birth year] Patient phone:  [phone] (home)  Patient address:   [address] [place] [address],   Date of Admission:  04/30/2012 Date of Discharge: 05/04/12  Reason for Admission: Opiate detoxification  Discharge Diagnoses: Principal Problem:  *Opiate dependence Active Problems:  Homeless   Axis Diagnosis:   AXIS I:  Opiate dependence  AXIS II:  Deferred AXIS III:   Past Medical History  Diagnosis Date  . Mental disorder   . Depression    AXIS IV:  other psychosocial or environmental problems AXIS V:  70  Level of Care:  RTC  Hospital Course:  Laura Matthews is a voluntary admission to [facility] who presented as a walk-in requesting help for her opiate addiction. She states she has been doing percocet and Oxycodone 20-25 pills a day for "years." She says she is "sick and tired of being sick and tired." She reports decreased sleep, poor appetite, and rates her depression as a 10/10. She state she has suicidal thoughts and feels that she has "hit rock bottom." She rates her anxiety as a 8/10, but denies psychosis, feelings of hopelessness, or helplessness.  Upon admission in this hospital, Laura Matthews was started on clonidine protocol for her opiate detoxification. She was also enrolled in group counseling sessions and activities to learn coping skills. She also attended AA/NA meetings being offered and held on this unit. She has no previous and or identifiable medical conditions to treat or monitor. However, she was monitored closely for any potential problems that may arise as a result of and or during her detoxification treatment. Patient tolerated his detoxification treatment without any significant adverse effects and or reactions.  Patient attended treatment team meeting this am and met with the team. Her symptoms, substance abuse issues, response to to treatment and  discharge plans discussed. Patient endorsed that she is doing well and stable for discharge to pursue the next phase of her substance abuse treatment.  It was agreed upon between patient and the team that she will be discharged to the residential treatment facility called [facility]. Patient will leave [facility] facility straight to [facility]. Upon discharge, patient adamantly denies suicidal, homicidal ideations, auditory, visual hallucinations, delusional thinking and or withdrawal symptoms. She received from [facility] 2 weeks worth samples of her discharged medications. Patient left [facility] with all personal belongings in no apparent distress, Transportation per [facility].    Consults:  None  Significant Diagnostic Studies:  labs: CBC with diff, CMP, Toxicology, UDS  Discharge Vitals:   Blood pressure 119/82, pulse 87, temperature 99.2 F (37.3 C), temperature source Oral, resp. rate 16, height 5\' 9"  (1.753 m), weight 82.101 kg (181 lb).  Mental Status Exam: See Mental Status Examination and Suicide Risk Assessment completed by Attending Physician prior to discharge.  Discharge destination:  [facility]  Is patient on multiple antipsychotic therapies at discharge:  No   Has Patient had three or more failed trials of antipsychotic monotherapy by history:  No  Recommended Plan for Multiple Antipsychotic Therapies: NA   Medication List  As of 05/04/2012 10:34 AM   STOP taking these medications         diphenhydramine-acetaminophen 25-500 MG Tabs         TAKE these medications      Indication    hydrOXYzine 25 MG tablet   Commonly known as: ATARAX/VISTARIL   Take 1 tablet (25 mg  total) by mouth every 6 (six) hours as needed for anxiety. For anxiety       traZODone 100 MG tablet   Commonly known as: DESYREL   Take 1 tablet (100 mg total) by mouth at bedtime as needed for sleep. For sleep              Follow-up recommendations:  Activity:  as tolerated Other:  Keep all scheduled follow-up appointments as  recommended.   Comments:  Take all your medications as prescribed by your mental healthcare provider. Report any adverse effects and or reactions from your medicines to your outpatient provider promptly. Patient is instructed and cautioned to not engage in alcohol and or illegal drug use while on prescription medicines. In the event of worsening symptoms, patient is instructed to call the crisis hotline, 911 and or go to the nearest ED for appropriate evaluation and treatment of symptoms. Follow-up with your primary care provider for your other medical issues, concerns and or health care needs.     SignedArmandina Stammer I 05/04/2012, 10:34 AM

## 2012-05-04 NOTE — Progress Notes (Signed)
D patient slept fair last nite d/t snoring of roommate, appetite is improving and going to DR for meals, energy level is low d/t S/S of withdrawal, and her ability to pay attention is improving, she is hopeless 6/10 today, experiencing diarrhea and agitation from withdrawal, denies Si or HI, worst pain is 6/10 (aching all over), not going to group this morning, taking meds as ordered by MD. A q57min safety checks continue and support offered, taking meds as ordered, no prn meds given or requested, overall feeling better today. R patient remains safe on the unit.

## 2012-05-09 NOTE — Progress Notes (Signed)
Patient Discharge Instructions:  After Visit Summary (AVS):   Faxed to:  05/09/2012 Psychiatric Admission Assessment Note:   Faxed to:  05/09/2012 Suicide Risk Assessment - Discharge Assessment:   Faxed to:  05/09/2012 Faxed/Sent to the Next Level Care provider:  05/09/2012  Faxed to [facility] @ [phone]  Heloise Purpura Eduard Clos, 05/09/2012, 3:11 PM

## 2014-01-24 ENCOUNTER — Encounter (HOSPITAL_COMMUNITY): Payer: Self-pay | Admitting: Emergency Medicine

## 2014-01-24 ENCOUNTER — Emergency Department (HOSPITAL_COMMUNITY)
Admission: EM | Admit: 2014-01-24 | Discharge: 2014-01-24 | Disposition: A | Discharge status: Home / Self Care | Payer: Medicaid Other | Attending: Emergency Medicine | Admitting: Emergency Medicine

## 2014-01-24 DIAGNOSIS — R209 Unspecified disturbances of skin sensation: Secondary | ICD-10-CM | POA: Insufficient documentation

## 2014-01-24 DIAGNOSIS — F329 Major depressive disorder, single episode, unspecified: Secondary | ICD-10-CM | POA: Insufficient documentation

## 2014-01-24 DIAGNOSIS — M792 Neuralgia and neuritis, unspecified: Secondary | ICD-10-CM

## 2014-01-24 DIAGNOSIS — F3289 Other specified depressive episodes: Secondary | ICD-10-CM | POA: Insufficient documentation

## 2014-01-24 DIAGNOSIS — F17200 Nicotine dependence, unspecified, uncomplicated: Secondary | ICD-10-CM | POA: Insufficient documentation

## 2014-01-24 DIAGNOSIS — M5412 Radiculopathy, cervical region: Secondary | ICD-10-CM | POA: Insufficient documentation

## 2014-01-24 DIAGNOSIS — F489 Nonpsychotic mental disorder, unspecified: Secondary | ICD-10-CM | POA: Insufficient documentation

## 2014-01-24 DIAGNOSIS — M62838 Other muscle spasm: Secondary | ICD-10-CM | POA: Insufficient documentation

## 2014-01-24 MED ORDER — NAPROXEN 500 MG PO TABS: 500.0000 mg | ORAL_TABLET | Freq: Two times a day (BID) | ORAL | Status: DC

## 2014-01-24 MED ORDER — CYCLOBENZAPRINE HCL 10 MG PO TABS: 10.0000 mg | ORAL_TABLET | Freq: Two times a day (BID) | ORAL | Status: DC | PRN

## 2014-01-24 NOTE — ED Notes (Signed)
Pt from home c/o L shouder pain with finger tingling x9 months. Pt reports that the pain is getting more intense starting to hurt into L neck. Pt denies CP, SOB, visual changes, dizziness. Pt is A&O and in NAD

## 2014-01-24 NOTE — ED Provider Notes (Signed)
CSN: [id number]     Arrival date & time 01/24/14  1508 History  This chart was scribed for non-physician practitioner, Noland Fordyce, PA-C, working with Dot Lanes, MD by Celesta Gentile, ED Scribe. This patient was seen in room [place] and the patient's care was started at 11:58 AM.  Chief Complaint  Patient presents with  . Shoulder Pain  . Tingling   The history is provided by the patient. No language interpreter was used.   HPI Comments: Laura Matthews is a 48 y.o. female who presents to the Emergency Department complaining of constant worsening left shoulder pain that started about 9 months ago with associated numbness and tingling in her left arm and hand.  Pt denies pain with rotation of her neck.  Pt states she feels like her left shoulder is extremely stiff.  Pt denies elbow pain, chest pain, and wrist pain.  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  She has tried Advil, Aleve, and Ibuprofen without relief.  She states when she tries Goody powders the pain subsides.  She reports she is right handed.  Pt denies having a PCP and denies any other medical problems.     Past Medical History  Diagnosis Date  . Mental disorder   . Depression    Past Surgical History  Procedure Laterality Date  . Knee surgery    . Foot surgery     No family history on file. History  Substance Use Topics  . Smoking status: Current Every Day Smoker -- 1.00 packs/day    Types: Cigarettes  . Smokeless tobacco: Never Used  . Alcohol Use: No     Comment: Percoset,Vicodin, Oxycotin   OB History   Grav Para Term Preterm Abortions TAB SAB Ect Mult Living                 Review of Systems  Constitutional: Negative for fever and chills.  Respiratory: Negative for cough and shortness of breath.   Cardiovascular: Negative for chest pain.  Gastrointestinal: Negative for nausea, vomiting, abdominal pain and diarrhea.  Musculoskeletal: Positive for arthralgias (left  shoulder). Negative for back pain, gait problem, joint swelling, myalgias, neck pain and neck stiffness.  Skin: Negative for color change and rash.  Neurological: Positive for numbness (left arm and hand). Negative for weakness and headaches.  All other systems reviewed and are negative.   Allergies  Review of patient's allergies indicates no known allergies.  Home Medications   Current Outpatient Rx  Name  Route  Sig  Dispense  Refill  . cyclobenzaprine (FLEXERIL) 10 MG tablet   Oral   Take 1 tablet (10 mg total) by mouth 2 (two) times daily as needed for muscle spasms.   20 tablet   0   . naproxen (NAPROSYN) 500 MG tablet   Oral   Take 1 tablet (500 mg total) by mouth 2 (two) times daily with a meal. Take twice daily with meals for 10 days, then take twice daily as needed for pain.   30 tablet   0   . EXPIRED: traZODone (DESYREL) 100 MG tablet   Oral   Take 1 tablet (100 mg total) by mouth at bedtime as needed for sleep. For sleep   30 tablet   0    Triage Vitals: BP 134/79  Pulse 86  Temp(Src) 98.5 F (36.9 C) (Oral)  Resp 14  SpO2 98%  Physical Exam  Nursing note and vitals reviewed. Constitutional: She  appears well-developed and well-nourished. No distress.  HENT:  Head: Normocephalic and atraumatic.  Eyes: Conjunctivae are normal. No scleral icterus.  Neck: Normal range of motion.  Cardiovascular: Normal rate, regular rhythm and normal heart sounds.   Pulmonary/Chest: Effort normal and breath sounds normal. No respiratory distress. She has no wheezes. She has no rales. She exhibits no tenderness.  Abdominal: Soft. She exhibits no distension.  Musculoskeletal: Normal range of motion.  Full ROM of left shoulder and elbow.  Tenderness in left upper trapezius muscle.  5 out 5 grip strength.  Radial pulses 2+  Neurological: She is alert.  Skin: Skin is warm, dry and intact. No ecchymosis and no rash noted. She is not diaphoretic. No erythema.    ED Course   Procedures (including critical care time) DIAGNOSTIC STUDIES: Oxygen Saturation is 98% on RQA, normal by my interpretation.    COORDINATION OF CARE: 4:58 PM-Will discharge with Flexeril and Naprosyn.  Patient informed of current plan of treatment and evaluation and agrees with plan.    MDM   Final diagnoses:  Muscle spasm of left shoulder  Radicular pain in left arm   Pt c/o intermittent shoulder pain and finger tingling. No hx of trauma. Denies fever or rashes.  Will tx as musculoskeletal pain. Do not believe imaging needed at this time. Advised to f/u with PCP. Return precautions provided. Pt verbalized understanding and agreement with tx plan.   I personally performed the services described in this documentation, which was scribed in my presence. The recorded information has been reviewed and is accurate.    Noland Fordyce, PA-C 01/25/14 1159

## 2014-01-25 NOTE — ED Provider Notes (Signed)
Medical screening examination/treatment/procedure(s) were performed by non-physician practitioner and as supervising physician I was immediately available for consultation/collaboration.    Dot Lanes, MD 01/25/14 [phone]

## 2014-04-22 ENCOUNTER — Inpatient Hospital Stay (HOSPITAL_COMMUNITY)
Admission: AD | Admit: 2014-04-22 | Discharge: 2014-04-22 | Disposition: A | Discharge status: Home / Self Care | Payer: Medicaid Other | Source: Ambulatory Visit | Attending: Obstetrics and Gynecology | Admitting: Obstetrics and Gynecology

## 2014-04-22 ENCOUNTER — Encounter (HOSPITAL_COMMUNITY): Payer: Self-pay | Admitting: *Deleted

## 2014-04-22 DIAGNOSIS — N6323 Unspecified lump in the left breast, lower outer quadrant: Secondary | ICD-10-CM

## 2014-04-22 DIAGNOSIS — F329 Major depressive disorder, single episode, unspecified: Secondary | ICD-10-CM | POA: Insufficient documentation

## 2014-04-22 DIAGNOSIS — N63 Unspecified lump in unspecified breast: Secondary | ICD-10-CM | POA: Insufficient documentation

## 2014-04-22 DIAGNOSIS — F3289 Other specified depressive episodes: Secondary | ICD-10-CM | POA: Diagnosis not present

## 2014-04-22 DIAGNOSIS — Z87891 Personal history of nicotine dependence: Secondary | ICD-10-CM | POA: Diagnosis not present

## 2014-04-22 LAB — URINALYSIS, ROUTINE W REFLEX MICROSCOPIC
BILIRUBIN URINE: NEGATIVE
Glucose, UA: NEGATIVE mg/dL
HGB URINE DIPSTICK: NEGATIVE
Ketones, ur: NEGATIVE mg/dL
Leukocytes, UA: NEGATIVE
Nitrite: NEGATIVE
Protein, ur: NEGATIVE mg/dL
SPECIFIC GRAVITY, URINE: 1.025 (ref 1.005–1.030)
Urobilinogen, UA: 0.2 mg/dL (ref 0.0–1.0)
pH: 6 (ref 5.0–8.0)

## 2014-04-22 LAB — POCT PREGNANCY, URINE: Preg Test, Ur: NEGATIVE

## 2014-04-22 MED ORDER — HYDROCODONE-ACETAMINOPHEN 5-325 MG PO TABS
1.0000 | ORAL_TABLET | Freq: Once | ORAL | Status: AC
  Administered 2014-04-22: 1 via ORAL
  Filled 2014-04-22: qty 1

## 2014-04-22 MED ORDER — HYDROCODONE-ACETAMINOPHEN 5-325 MG PO TABS: 1.0000 | ORAL_TABLET | Freq: Four times a day (QID) | ORAL | Status: DC | PRN

## 2014-04-22 NOTE — Discharge Instructions (Signed)
Your exam today shows that you have a breast mass in your left breast. This may be a cyst but needs further evaluation to be sure exactly what it is. Someone will call you from our office here in the [facility] to schedule a time to come in for further evaluation of your breast mass. We are giving you pain medication and you should continue to take ibuprofen in addition. Do not take the narcotic if you are driving as it will make yo sleepy.

## 2014-04-22 NOTE — MAU Note (Signed)
Patient states she has had pain in the left breast pain for about 6 months. States she had felt two lumps in the left breast, on that is large and one smaller. Has felt extreme fatigue for about one month. Has had an ablation and does not have periods.

## 2014-04-22 NOTE — MAU Provider Note (Signed)
History     CSN: [id number]  Arrival date and time: 04/22/14 1200   First Provider Initiated Contact with Patient 04/22/14 1511      Chief Complaint  Patient presents with  . Breast Pain  . Breast Mass   HPI  Laura Matthews is a 48 y/o [id number] black female who presents to the MAU with complaints of breast lumps, breast pain, and fatigue x 2 weeks. Pt reports having constant left breast pain for the past 2 weeks. At that time, pt was able to palpate two breast lumps on the outer border of the left breast. Pt denies nipple discharge, breast dimpling, retractions, and skin changes. Pt also denies fever, chills, night sweats, weight changes, abdominal pain, nausea and vomiting. Pt has a strong FH of breast cancer and is concerned that this is the cause of her symptoms. Pt reports that she has not menstruated in a few years due to a procedure to her uterus. Date of LMP is unknown.   Past Medical History  Diagnosis Date  . Mental disorder   . Depression     Past Surgical History  Procedure Laterality Date  . Knee surgery    . Foot surgery      Family History  Problem Relation Age of Onset  . Cancer Mother     History  Substance Use Topics  . Smoking status: Former Smoker -- 1.00 packs/day    Types: Cigarettes  . Smokeless tobacco: Never Used  . Alcohol Use: No     Comment: Percoset,Vicodin, Oxycotin    Allergies: No Known Allergies  Prescriptions prior to admission  Medication Sig Dispense Refill  . naproxen sodium (ANAPROX) 220 MG tablet Take 440 mg by mouth 3 (three) times daily as needed (For pain.).        Review of Systems  Constitutional: Positive for malaise/fatigue (x 2 weeks). Negative for fever, chills and weight loss.  Respiratory: Negative for shortness of breath.   Cardiovascular: Negative for chest pain.  Gastrointestinal: Negative for nausea, vomiting and abdominal pain.  Genitourinary:       Negative vaginal and UTI symptoms.  Neurological: Negative  for dizziness and headaches.   Physical Exam   Blood pressure 160/83, pulse 96, temperature 99.1 F (37.3 C), temperature source Oral, resp. rate 16, height 5' 9.5" (1.765 m), weight 82.101 kg (181 lb), SpO2 98.00%.  Physical Exam  Constitutional: She is oriented to person, place, and time. She appears well-developed and well-nourished. No distress.  Cardiovascular: Normal rate, regular rhythm and normal heart sounds.   Respiratory: Effort normal and breath sounds normal. Left breast exhibits mass (large nontender palpable mass in 4 o'clock position and small tender palpable mass in 6 o'clock position ) and tenderness (on the inferior portion of the breast near the lower outer quadrant). Left breast exhibits no inverted nipple, no nipple discharge and no skin change. Breasts are symmetrical.    GI: Soft. Bowel sounds are normal. She exhibits no distension and no mass. There is no tenderness. There is no rebound and no guarding.  Lymphadenopathy:    She has no axillary adenopathy.  Neurological: She is alert and oriented to person, place, and time.  Skin: Skin is warm and dry.  Psychiatric: She has a normal mood and affect. Her behavior is normal.    No results found for this or any previous visit (from the past 24 hour(s)).  MAU Course  Procedures  MDM Given Hydrocodone-acetaminophen 5-325 mg tablet, one time dose for breast  pain   Assessment and Plan  Assessment: 1. Breast lump on left side at 4 o'clock position    Plan: [facility] will call to make a follow-up appointment for a further workup.     Medication List    TAKE these medications       HYDROcodone-acetaminophen 5-325 MG per tablet  Commonly known as:  NORCO  Take 1 tablet by mouth every 6 (six) hours as needed for moderate pain.      ASK your doctor about these medications       naproxen sodium 220 MG tablet  Commonly known as:  ANAPROX  Take 440 mg by mouth 3 (three) times daily as  needed (For pain.).        Lillia Abed 04/22/2014, 4:10 PM   I have examined this patient with the student and discussed findings and plan of care. Patient agrees to plan. She will return as needed for any problems.

## 2014-04-22 NOTE — Progress Notes (Signed)
Provider in earlier to discuss d/c plan. Written and verbal d/c instructions given and understanding voiced

## 2014-04-24 ENCOUNTER — Other Ambulatory Visit: Payer: Self-pay | Admitting: Nurse Practitioner

## 2014-04-24 DIAGNOSIS — N6320 Unspecified lump in the left breast, unspecified quadrant: Secondary | ICD-10-CM

## 2014-04-24 NOTE — MAU Provider Note (Signed)
Attestation of Attending Supervision of Advanced Practitioner (CNM/NP): Evaluation and management procedures were performed by the Advanced Practitioner under my supervision and collaboration.  I have reviewed the Advanced Practitioner's note and chart, and I agree with the management and plan.  Laura Matthews,Laura Matthews 04/24/2014 9:03 AM

## 2014-05-05 ENCOUNTER — Ambulatory Visit
Admission: RE | Admit: 2014-05-05 | Discharge: 2014-05-05 | Disposition: A | Discharge status: Home / Self Care | Payer: Medicaid Other | Source: Ambulatory Visit | Attending: Nurse Practitioner | Admitting: Nurse Practitioner

## 2014-05-05 DIAGNOSIS — N6320 Unspecified lump in the left breast, unspecified quadrant: Secondary | ICD-10-CM

## 2014-05-06 ENCOUNTER — Other Ambulatory Visit: Payer: Self-pay | Admitting: Obstetrics and Gynecology

## 2014-05-06 ENCOUNTER — Encounter (HOSPITAL_COMMUNITY): Payer: Self-pay

## 2014-05-06 ENCOUNTER — Ambulatory Visit (HOSPITAL_COMMUNITY)
Admission: RE | Admit: 2014-05-06 | Discharge: 2014-05-06 | Disposition: A | Discharge status: Home / Self Care | Payer: Medicaid Other | Source: Ambulatory Visit | Attending: Obstetrics and Gynecology | Admitting: Obstetrics and Gynecology

## 2014-05-06 VITALS — BP 118/72 | Temp 98.7°F | Ht 70.0 in | Wt 178.8 lb

## 2014-05-06 DIAGNOSIS — Z01419 Encounter for gynecological examination (general) (routine) without abnormal findings: Secondary | ICD-10-CM

## 2014-05-06 DIAGNOSIS — N63 Unspecified lump in unspecified breast: Secondary | ICD-10-CM | POA: Insufficient documentation

## 2014-05-06 DIAGNOSIS — N6325 Unspecified lump in the left breast, overlapping quadrants: Secondary | ICD-10-CM | POA: Insufficient documentation

## 2014-05-06 DIAGNOSIS — N6320 Unspecified lump in the left breast, unspecified quadrant: Secondary | ICD-10-CM

## 2014-05-06 NOTE — Patient Instructions (Signed)
[place] how to perform BSE and gave educational materials to take home. Let her know [facility] will cover Pap smears every 3 years unless has a history of abnormal Pap smears. Referred patient to the [facility] for left breast biopsy per recommendation. Appointment scheduled for Friday, May 09, 2014 at 1315. Patient aware of appointment and will be there. Let patient know will follow up with her within the next couple weeks with results of Pap smear by phone. Neldon Newport verbalized understanding.  Javeion Cannedy, Arvil Chaco, RN 2:32 PM

## 2014-05-06 NOTE — Progress Notes (Signed)
Complaints of left breast lump. Patient referred to [facility] by the [facility] due to recommending a left breast biopsy. Diagnostic mammogram completed at the [facility] on 05/05/2014.  Pap Smear:  Completed Pap smear today. Last Pap smear was in 2011 and normal per patient. Per patient has no history of an abnormal Pap smear. No Pap smear results in EPIC.  Physical exam: Breasts Breasts symmetrical. No skin abnormalities bilateral breasts. No nipple retraction bilateral breasts. No nipple discharge bilateral breasts. No lymphadenopathy. No lumps palpated right breast. Palpated a lump within the left breast at 3 o'clock about 10 cm from the nipple. Complaints of tenderness when palpated lump. Referred patient to the [facility] for left breast biopsy per recommendation. Appointment scheduled for Friday, May 09, 2014 at 1315.    Pelvic/Bimanual   Ext Genitalia No lesions, no swelling and no discharge observed on external genitalia.         Vagina Vagina pink and normal texture. No lesions or discharge observed in vagina.          Cervix Cervix is present. Cervix pink and of normal texture. Cervix friable. Small amount of mucous like discharge from cervical opening.          Uterus Uterus is present and palpable. Uterus in normal position and normal size.       Adnexae Bilateral ovaries present and palpable. No tenderness on palpation.        Rectovaginal No rectal exam completed today since patient had no rectal complaints. No skin abnormalities observed on exam.

## 2014-05-08 ENCOUNTER — Other Ambulatory Visit: Payer: Self-pay | Admitting: Obstetrics and Gynecology

## 2014-05-08 ENCOUNTER — Other Ambulatory Visit: Payer: Self-pay

## 2014-05-08 DIAGNOSIS — N63 Unspecified lump in unspecified breast: Secondary | ICD-10-CM

## 2014-05-08 LAB — CYTOLOGY - PAP

## 2014-05-09 ENCOUNTER — Other Ambulatory Visit: Payer: Self-pay | Admitting: Obstetrics and Gynecology

## 2014-05-09 ENCOUNTER — Ambulatory Visit
Admission: RE | Admit: 2014-05-09 | Discharge: 2014-05-09 | Disposition: A | Discharge status: Home / Self Care | Payer: Medicaid Other | Source: Ambulatory Visit | Attending: Obstetrics and Gynecology | Admitting: Obstetrics and Gynecology

## 2014-05-09 DIAGNOSIS — N63 Unspecified lump in unspecified breast: Secondary | ICD-10-CM

## 2014-05-12 ENCOUNTER — Other Ambulatory Visit (INDEPENDENT_AMBULATORY_CARE_PROVIDER_SITE_OTHER): Payer: Self-pay | Admitting: General Surgery

## 2014-05-12 DIAGNOSIS — C50919 Malignant neoplasm of unspecified site of unspecified female breast: Secondary | ICD-10-CM

## 2014-05-16 ENCOUNTER — Telehealth: Payer: Self-pay | Admitting: *Deleted

## 2014-05-16 ENCOUNTER — Ambulatory Visit
Admission: RE | Admit: 2014-05-16 | Discharge: 2014-05-16 | Disposition: A | Discharge status: Home / Self Care | Payer: No Typology Code available for payment source | Source: Ambulatory Visit | Attending: General Surgery | Admitting: General Surgery

## 2014-05-16 DIAGNOSIS — C50919 Malignant neoplasm of unspecified site of unspecified female breast: Secondary | ICD-10-CM

## 2014-05-16 MED ORDER — GADOBENATE DIMEGLUMINE 529 MG/ML IV SOLN
17.0000 mL | Freq: Once | INTRAVENOUS | Status: AC | PRN
  Administered 2014-05-16: 17 mL via INTRAVENOUS

## 2014-05-16 NOTE — Telephone Encounter (Signed)
Left message for a return phone call to schedule patient for [facility] 05/21/14.  Awaiting patient response.

## 2014-05-19 ENCOUNTER — Telehealth: Payer: Self-pay | Admitting: *Deleted

## 2014-05-19 DIAGNOSIS — C50412 Malignant neoplasm of upper-outer quadrant of left female breast: Secondary | ICD-10-CM | POA: Insufficient documentation

## 2014-05-19 NOTE — Telephone Encounter (Signed)
Received message from patient that she was returning my phone call.  Attempted to call patient but spoke to her daughter [name]) who states she does not have a direct number to contact her back but I could give her the information because she would be the one bringing her to her appointment on 05/21/14.  Instructions and contact information given and confirmed appointment for 05/21/14 at 8am.

## 2014-05-19 NOTE — Telephone Encounter (Signed)
Received call from patient stating she has been trying to call me all day.  She has left several messages although all from different numbers and she did not leave a number that she could be reached.  Informed her that I have been on and off the phone with patients today and that I had spoken with her daughter who will be bringing her on Wednesday.  I gave her instructions for clinic on Wednesday. Before I could finish she began to ask who she could get some pain medications from because she has been having migraines.  Explained to her that we will be treating her breast cancer and that if she having issues with migraines she could go to urgent care or ED since she does not have a primary care MD. Patient was persistent about pain medications and stated " I'm also having breast pain so I will see the surgeon on Wednesday and they can give me some pain medication."  Explained she could discuss then.  Patient verbalized understanding.

## 2014-05-21 ENCOUNTER — Ambulatory Visit
Admission: RE | Admit: 2014-05-21 | Discharge: 2014-05-21 | Disposition: A | Discharge status: Home / Self Care | Payer: Medicaid Other | Source: Ambulatory Visit | Attending: Radiation Oncology | Admitting: Radiation Oncology

## 2014-05-21 ENCOUNTER — Other Ambulatory Visit (HOSPITAL_BASED_OUTPATIENT_CLINIC_OR_DEPARTMENT_OTHER): Payer: Medicaid Other

## 2014-05-21 ENCOUNTER — Ambulatory Visit: Payer: Medicaid Other

## 2014-05-21 ENCOUNTER — Ambulatory Visit (HOSPITAL_BASED_OUTPATIENT_CLINIC_OR_DEPARTMENT_OTHER): Payer: Medicaid Other | Admitting: General Surgery

## 2014-05-21 ENCOUNTER — Ambulatory Visit: Payer: Medicaid Other | Attending: General Surgery | Admitting: Physical Therapy

## 2014-05-21 ENCOUNTER — Telehealth (HOSPITAL_COMMUNITY): Payer: Self-pay | Admitting: *Deleted

## 2014-05-21 ENCOUNTER — Encounter: Payer: Self-pay | Admitting: Oncology

## 2014-05-21 ENCOUNTER — Telehealth: Payer: Self-pay | Admitting: Oncology

## 2014-05-21 ENCOUNTER — Encounter: Payer: Self-pay | Admitting: *Deleted

## 2014-05-21 ENCOUNTER — Encounter: Payer: Self-pay | Admitting: Dietician

## 2014-05-21 ENCOUNTER — Ambulatory Visit (HOSPITAL_BASED_OUTPATIENT_CLINIC_OR_DEPARTMENT_OTHER): Payer: Medicaid Other | Admitting: Oncology

## 2014-05-21 ENCOUNTER — Other Ambulatory Visit: Payer: Self-pay | Admitting: *Deleted

## 2014-05-21 VITALS — BP 154/82 | HR 72 | Temp 98.2°F | Resp 20 | Ht 70.0 in | Wt 173.6 lb

## 2014-05-21 DIAGNOSIS — C50412 Malignant neoplasm of upper-outer quadrant of left female breast: Secondary | ICD-10-CM

## 2014-05-21 DIAGNOSIS — C50919 Malignant neoplasm of unspecified site of unspecified female breast: Secondary | ICD-10-CM | POA: Diagnosis not present

## 2014-05-21 DIAGNOSIS — C50419 Malignant neoplasm of upper-outer quadrant of unspecified female breast: Secondary | ICD-10-CM

## 2014-05-21 DIAGNOSIS — Z170 Estrogen receptor positive status [ER+]: Secondary | ICD-10-CM

## 2014-05-21 DIAGNOSIS — R112 Nausea with vomiting, unspecified: Secondary | ICD-10-CM

## 2014-05-21 DIAGNOSIS — R293 Abnormal posture: Secondary | ICD-10-CM | POA: Diagnosis not present

## 2014-05-21 DIAGNOSIS — F17200 Nicotine dependence, unspecified, uncomplicated: Secondary | ICD-10-CM

## 2014-05-21 DIAGNOSIS — IMO0001 Reserved for inherently not codable concepts without codable children: Secondary | ICD-10-CM | POA: Diagnosis present

## 2014-05-21 DIAGNOSIS — R51 Headache: Secondary | ICD-10-CM

## 2014-05-21 LAB — COMPREHENSIVE METABOLIC PANEL (CC13)
ALT: 23 U/L (ref 0–55)
ANION GAP: 11 meq/L (ref 3–11)
AST: 19 U/L (ref 5–34)
Albumin: 4 g/dL (ref 3.5–5.0)
Alkaline Phosphatase: 57 U/L (ref 40–150)
BUN: 8.1 mg/dL (ref 7.0–26.0)
CALCIUM: 9.1 mg/dL (ref 8.4–10.4)
CHLORIDE: 104 meq/L (ref 98–109)
CO2: 24 meq/L (ref 22–29)
CREATININE: 0.7 mg/dL (ref 0.6–1.1)
Glucose: 214 mg/dl — ABNORMAL HIGH (ref 70–140)
Potassium: 4 mEq/L (ref 3.5–5.1)
Sodium: 138 mEq/L (ref 136–145)
Total Bilirubin: 0.34 mg/dL (ref 0.20–1.20)
Total Protein: 7.3 g/dL (ref 6.4–8.3)

## 2014-05-21 LAB — CBC WITH DIFFERENTIAL/PLATELET
BASO%: 0.9 % (ref 0.0–2.0)
Basophils Absolute: 0.1 10*3/uL (ref 0.0–0.1)
EOS%: 2.9 % (ref 0.0–7.0)
Eosinophils Absolute: 0.3 10*3/uL (ref 0.0–0.5)
HEMATOCRIT: 39.4 % (ref 34.8–46.6)
HGB: 12.9 g/dL (ref 11.6–15.9)
LYMPH#: 2.3 10*3/uL (ref 0.9–3.3)
LYMPH%: 23 % (ref 14.0–49.7)
MCH: 29.6 pg (ref 25.1–34.0)
MCHC: 32.6 g/dL (ref 31.5–36.0)
MCV: 90.8 fL (ref 79.5–101.0)
MONO#: 0.5 10*3/uL (ref 0.1–0.9)
MONO%: 4.7 % (ref 0.0–14.0)
NEUT#: 6.8 10*3/uL — ABNORMAL HIGH (ref 1.5–6.5)
NEUT%: 68.5 % (ref 38.4–76.8)
Platelets: 359 10*3/uL (ref 145–400)
RBC: 4.35 10*6/uL (ref 3.70–5.45)
RDW: 13.3 % (ref 11.2–14.5)
WBC: 10 10*3/uL (ref 3.9–10.3)

## 2014-05-21 MED ORDER — PROMETHAZINE HCL 25 MG RE SUPP: 25.0000 mg | Freq: Once | RECTAL | Status: DC

## 2014-05-21 NOTE — Progress Notes (Addendum)
Chief complaint:  New left breast cancer  HISTORY: Patient is a 48 year old female who presented with a palpable left breast mass. She is referred by Dr. Evangeline Dakin for consultation for this new breast cancer.  She noted this around 2-3 months ago. She did go to the emergency department several weeks ago because of breast pain. She's also had 2 weeks of nausea, vomiting, and headache.  She underwent imaging which demonstrated a 1.8 x 1.5 cm mass at 3:00 in her left breast. She also had some abnormal appearing lymph nodes in the left axilla. These were recommended to be biopsied, but she did not want to undergo this. She had MRI with similar findings. 2 no other lesions noted. Her pathology was positive for invasive mammary carcinoma this was ER and PR positive and HER-2 negative. Ki-67 was 80%.  She does have a family history of her mother having breast cancer at age 64. She is not sure if her mother also had ovarian cancer. Her father had lung cancer. She has no personal history of cancer. She had menarche at age 59. She has not used hormone replacement for hormonal contraception. She is postmenopausal and had 3 children with the first of 62. She has had a Pap smear 2 weeks ago. She got her first mammogram in 2001.  Past Medical History  Diagnosis Date  . Mental disorder   . Depression   . Arthritis   . Hot flashes   . Head ache     Past Surgical History  Procedure Laterality Date  . Knee surgery    . Foot surgery      Current Outpatient Prescriptions  Medication Sig Dispense Refill  . HYDROcodone-acetaminophen (NORCO) 5-325 MG per tablet Take 1 tablet by mouth every 6 (six) hours as needed for moderate pain.  15 tablet  0  . naproxen sodium (ANAPROX) 220 MG tablet Take 440 mg by mouth 3 (three) times daily as needed (For pain.).      Marland Kitchen promethazine (PHENERGAN) 25 MG suppository Place 1 suppository (25 mg total) rectally once.  1 each  0   No current facility-administered  medications for this visit.     Allergies  Allergen Reactions  . Norco [Hydrocodone-Acetaminophen] Itching and Nausea Only    Pt reported allergy.     Family History  Problem Relation Age of Onset  . Breast cancer Mother   . Cancer Mother     uterine  . Breast cancer Maternal Aunt   . Lung cancer Father      History   Social History  . Marital Status: Single    Spouse Name: N/A    Number of Children: N/A  . Years of Education: N/A   Social History Main Topics  . Smoking status: Current Every Day Smoker -- 1.00 packs/day    Types: Cigarettes  . Smokeless tobacco: Never Used  . Alcohol Use: No     Comment: Percoset,Vicodin, Oxycotin  . Drug Use: No  . Sexual Activity: No   Other Topics Concern  . Not on file   Social History Narrative  . No narrative on file   Pt has been in alcohol rehab twice.    REVIEW OF SYSTEMS - PERTINENT POSITIVES ONLY: 12 point review of systems negative other than HPI and PMH except for chills, night sweats, sleep difficulty, breast pain, sleeps on 2 pillows, nausea/vomiting, joint pain, headaches, headaches, hot flashes.    EXAM: Wt Readings from Last 3 Encounters:  05/21/14 173 lb  9.6 oz (78.744 kg)  05/06/14 178 lb 12.8 oz (81.103 kg)  04/22/14 181 lb (82.101 kg)   Temp Readings from Last 3 Encounters:  05/21/14 98.2 F (36.8 C) Oral  05/06/14 98.7 F (37.1 C) Oral  04/22/14 99.1 F (37.3 C) Oral   BP Readings from Last 3 Encounters:  05/21/14 154/82  05/06/14 118/72  04/22/14 160/83   Pulse Readings from Last 3 Encounters:  05/21/14 72  04/22/14 96  01/24/14 86     Wt Readings from Last 3 Encounters:  05/21/14 173 lb 9.6 oz (78.744 kg)  05/06/14 178 lb 12.8 oz (81.103 kg)  04/22/14 181 lb (82.101 kg)     Gen:  No acute distress.  Well nourished and well groomed.   Neurological: Alert and oriented to person, place, and time. Coordination normal.  Head: Normocephalic and atraumatic.  Eyes: Conjunctivae are  normal. Pupils are equal, round, and reactive to light. No scleral icterus.  Neck: Normal range of motion. Neck supple. No tracheal deviation or thyromegaly present.  Cardiovascular: Normal rate, regular rhythm, normal heart sounds and intact distal pulses.  Exam reveals no gallop and no friction rub.  No murmur heard. Breast: palpable 2-3 cm mass at 2-3 o'clock on left.  No palpable lymphadenopathy.  Both breasts sore. Bruising near biopsy site.  No other palpable masses in either breast.  Normal ptotic configuration bilaterally.  No nipple retraction or discharge.  No skin dimpling.  Respiratory: Effort normal.  No respiratory distress. No chest wall tenderness. Breath sounds normal.  No wheezes, rales or rhonchi.  GI: Soft. Bowel sounds are normal. The abdomen is soft and nontender.  There is no rebound and no guarding.  Musculoskeletal: Normal range of motion. Extremities are nontender.  Lymphadenopathy: No cervical, preauricular, postauricular or axillary adenopathy is present Skin: Skin is warm and dry. No rash noted. No diaphoresis. No erythema. No pallor. No clubbing, cyanosis, or edema.   Psychiatric: Normal mood and affect. Behavior is anxious.  Judgment and thought content normal.    LABORATORY RESULTS: Available labs are reviewed   Recent Results (from the past 2160 hour(s))  URINALYSIS, ROUTINE W REFLEX MICROSCOPIC     Status: Abnormal   Collection Time    04/22/14  4:16 PM      Result Value Ref Range   Color, Urine YELLOW  YELLOW   APPearance HAZY (*) CLEAR   Specific Gravity, Urine 1.025  1.005 - 1.030   pH 6.0  5.0 - 8.0   Glucose, UA NEGATIVE  NEGATIVE mg/dL   Hgb urine dipstick NEGATIVE  NEGATIVE   Bilirubin Urine NEGATIVE  NEGATIVE   Ketones, ur NEGATIVE  NEGATIVE mg/dL   Protein, ur NEGATIVE  NEGATIVE mg/dL   Urobilinogen, UA 0.2  0.0 - 1.0 mg/dL   Nitrite NEGATIVE  NEGATIVE   Leukocytes, UA NEGATIVE  NEGATIVE   Comment: MICROSCOPIC NOT DONE ON URINES WITH NEGATIVE  PROTEIN, BLOOD, LEUKOCYTES, NITRITE, OR GLUCOSE <1000 mg/dL.  POCT PREGNANCY, URINE     Status: None   Collection Time    04/22/14  4:24 PM      Result Value Ref Range   Preg Test, Ur NEGATIVE  NEGATIVE   Comment:            THE SENSITIVITY OF THIS     METHODOLOGY IS >24 mIU/mL  CYTOLOGY - PAP     Status: None   Collection Time    05/06/14 12:00 AM      Result Value Ref  Range   CYTOLOGY - PAP PAP RESULT    CBC WITH DIFFERENTIAL     Status: Abnormal   Collection Time    05/21/14  8:37 AM      Result Value Ref Range   WBC 10.0  3.9 - 10.3 10e3/uL   NEUT# 6.8 (*) 1.5 - 6.5 10e3/uL   HGB 12.9  11.6 - 15.9 g/dL   HCT 39.4  34.8 - 46.6 %   Platelets 359  145 - 400 10e3/uL   MCV 90.8  79.5 - 101.0 fL   MCH 29.6  25.1 - 34.0 pg   MCHC 32.6  31.5 - 36.0 g/dL   RBC 4.35  3.70 - 5.45 10e6/uL   RDW 13.3  11.2 - 14.5 %   lymph# 2.3  0.9 - 3.3 10e3/uL   MONO# 0.5  0.1 - 0.9 10e3/uL   Eosinophils Absolute 0.3  0.0 - 0.5 10e3/uL   Basophils Absolute 0.1  0.0 - 0.1 10e3/uL   NEUT% 68.5  38.4 - 76.8 %   LYMPH% 23.0  14.0 - 49.7 %   MONO% 4.7  0.0 - 14.0 %   EOS% 2.9  0.0 - 7.0 %   BASO% 0.9  0.0 - 2.0 %  COMPREHENSIVE METABOLIC PANEL (HC62)     Status: Abnormal   Collection Time    05/21/14  8:37 AM      Result Value Ref Range   Sodium 138  136 - 145 mEq/L   Potassium 4.0  3.5 - 5.1 mEq/L   Chloride 104  98 - 109 mEq/L   CO2 24  22 - 29 mEq/L   Glucose 214 (*) 70 - 140 mg/dl   BUN 8.1  7.0 - 26.0 mg/dL   Creatinine 0.7  0.6 - 1.1 mg/dL   Total Bilirubin 0.34  0.20 - 1.20 mg/dL   Alkaline Phosphatase 57  40 - 150 U/L   AST 19  5 - 34 U/L   ALT 23  0 - 55 U/L   Total Protein 7.3  6.4 - 8.3 g/dL   Albumin 4.0  3.5 - 5.0 g/dL   Calcium 9.1  8.4 - 10.4 mg/dL   Anion Gap 11  3 - 11 mEq/L     RADIOLOGY RESULTS: See E-Chart or I-Site for most recent results.  Images and reports are reviewed.  Mr Breast Bilateral W Wo Contrast  05/16/2014   CLINICAL DATA:  48 year old female  recently diagnosed with invasive mammary carcinoma in the 3 o'clock region of the left breast. During her diagnostic workup abnormal left axillary adenopathy was imaged. The patient declined biopsy of the axillary adenopathy.  LABS:  None.  EXAM: BILATERAL BREAST MRI WITH AND WITHOUT CONTRAST  TECHNIQUE: Multiplanar, multisequence MR images of both breasts were obtained prior to and following the intravenous administration of 13m of MultiHance.  THREE-DIMENSIONAL MR IMAGE RENDERING ON INDEPENDENT WORKSTATION:  Three-dimensional MR images were rendered by post-processing of the original MR data on an independent workstation. The three-dimensional MR images were interpreted, and findings are reported in the following complete MRI report for this study. Three dimensional images were evaluated at the independent DynaCad workstation  COMPARISON:  Mammograms dated 05/05/2014 and 05/09/2014.  FINDINGS: Breast composition: b.  Scattered fibroglandular tissue.  Background parenchymal enhancement: Moderate  Right breast: No mass or abnormal enhancement.  Left breast: In the posterior third of the 3 o'clock region of the left breast there is an irregular enhancing mass measuring 1.7 x 1.7 x 1.6  cm. Along the medial aspect of the mass is a signal void artifact corresponding with the biopsy clip. Post biopsy changes are visualized.  Lymph nodes: There are 3 level I left axillary lymph nodes that have thickened cortices worrisome for metastatic disease. The largest lymph node measures 1.3 cm.  Ancillary findings:  None.  IMPRESSION: 1.7 cm mass in the 3 o'clock region of the left breast corresponding with the recently diagnosed invasive mammary carcinoma. Three level I left axillary lymph nodes worrisome for metastatic disease.  RECOMMENDATION: Treatment planning of the known left breast invasive mammary carcinoma is recommended. The patient is being seen at the [facility] on  05/21/2014. If left axillary lymph node ultrasound-guided biopsy is desired this can be scheduled.  BI-RADS CATEGORY  6: Known biopsy-proven malignancy.   Electronically Signed   By: Lillia Mountain M.D.   On: 05/16/2014 12:29   Mm Digital Diagnostic Bilat  05/05/2014   CLINICAL DATA:  48 year old patient with palpable area in the left breast in the 3 o'clock region. She palpates this mass today. Recently, she felt a possible mass in the 6 o'clock region of the left breast. She does not definitely feel this today. According to the patient, her mother has a history of breast cancer and later had her ovaries removed (for possible ovarian cancer).  EXAM: DIGITAL DIAGNOSTIC  BILATERAL MAMMOGRAM WITH CAD  ULTRASOUND LEFT BREAST  COMPARISON:  07/22/2009  ACR Breast Density Category b: There are scattered areas of fibroglandular density.  FINDINGS: Metallic skin marker was placed at the site of palpable concern in the outer left breast. The patient did not definitely is feel second area of recent palpable concern in the inferior left breast, so metallic marker was not placed in this region.  There is an irregular partially obscured mass in the outer left breast, subtending the metallic skin marker. No additional masses identified in the left breast. No mass is identified in the right breast. No suspicious microcalcification or distortion is seen in either breast. Skin appears normal.  Mammographic images were processed with CAD.  On physical exam, there is a firm fixed mass in the 3 o'clock position of the left breast approximately 10 cm from the nipple. I do not palpate a mass in the inferior left breast.  Ultrasound is performed, showing a hypoechoic, irregular solid mass with angular margins. The mass measures 1.8 x 1.5 x 1.2 cm and demonstrates internal color Doppler flow.  Ultrasound of the inferior left breast is negative.  Ultrasound of the left axilla demonstrates 2 suspicious appearing level 1 lymph nodes. The  largest of these lymph nodes measures 1.0 x 0.6 cm and has a thickened pole the cortex. The smaller lymph node is completely hypoechoic, with no visible fatty hilum and measures 0.6 cm.  IMPRESSION: Highly suspicious palpable mass 3 o'clock position of the left breast. This mass measures 1.8 cm greatest diameter. Two suspicious left axillary lymph nodes. The patient will be referred to [facility], as she does not have medical insurance.  RECOMMENDATION: Ultrasound-guided biopsies of the suspicious left breast mass and 1 of 2 suspicious left axillary lymph nodes is recommended. These biopsies will be performed after the patient has been enrolled in [facility].  I have discussed the findings and recommendations with the patient. Results were also provided in writing at the conclusion of the visit. If applicable, a reminder letter will be sent to the patient regarding the next appointment.  BI-RADS CATEGORY  5:  Highly suggestive of malignancy.   Electronically Signed   By: Curlene Dolphin M.D.   On: 05/05/2014 12:12   Mm Digital Diagnostic Unilat L  05/12/2014   ADDENDUM REPORT: 05/12/2014 16:39  ADDENDUM: Pathology results of the left breast ultrasound-guided biopsy demonstrated invasive mammary carcinoma. Pathology results are concordant with imaging findings. Per the patient's request, I discussed pathology results with the patient's daughter, Alaney Witter. Candice reports no problems regarding the patient's biopsy site.  The patient declined a biopsy of suspicious left axillary lymph node at the time of her left breast biopsy, 05/09/2014. I discussed with Alezandra Egli that her mother can discuss the possibility of the left axillary lymph node biopsy again with the surgeon she meets at the [facility], and if the biopsy is desired, we will certainly attempt a left axillary lymph node biopsy for treatment planning purposes.  The following appointments for Mickel Fuchs were given to [facility]  over the telephone: Breast MRI is scheduled 05/16/2014 8:45 a.m.  The patient has an appointment scheduled of the [facility] at [facility] on 05/21/2014 at 8 o'clock a.m.   Electronically Signed   By: Curlene Dolphin M.D.   On: 05/12/2014 16:39   05/12/2014   CLINICAL DATA:  Ultrasound-guided core needle biopsy of a suspicious 1.8 cm mass in the outer left breast earlier today.  EXAM: DIAGNOSTIC LEFT MAMMOGRAM POST ULTRASOUND BIOPSY  COMPARISON:  Previous exams.  FINDINGS: Mammographic images were obtained following ultrasound guided biopsy of the 1.8 cm suspicious mass in the outer left breast. The coil shaped tissue marker clip is positioned at the anterior margin of the mass. Expected post biopsy changes are present without evidence of hematoma.  IMPRESSION: Appropriate positioning of the coil shaped tissue marker clip at the anterior margin of the biopsied mass in the outer left breast.  Final Assessment: Post Procedure Mammograms for Marker Placement  Electronically Signed: By: Evangeline Dakin M.D. On: 05/09/2014 15:04   US Breast Ltd Uni Left Inc Axilla  05/05/2014   CLINICAL DATA:  48 year old patient with palpable area in the left breast in the 3 o'clock region. She palpates this mass today. Recently, she felt a possible mass in the 6 o'clock region of the left breast. She does not definitely feel this today. According to the patient, her mother has a history of breast cancer and later had her ovaries removed (for possible ovarian cancer).  EXAM: DIGITAL DIAGNOSTIC  BILATERAL MAMMOGRAM WITH CAD  ULTRASOUND LEFT BREAST  COMPARISON:  07/22/2009  ACR Breast Density Category b: There are scattered areas of fibroglandular density.  FINDINGS: Metallic skin marker was placed at the site of palpable concern in the outer left breast. The patient did not definitely is feel second area of recent palpable concern in the inferior left breast, so metallic marker was not placed in this  region.  There is an irregular partially obscured mass in the outer left breast, subtending the metallic skin marker. No additional masses identified in the left breast. No mass is identified in the right breast. No suspicious microcalcification or distortion is seen in either breast. Skin appears normal.  Mammographic images were processed with CAD.  On physical exam, there is a firm fixed mass in the 3 o'clock position of the left breast approximately 10 cm from the nipple. I do not palpate a mass in the inferior left breast.  Ultrasound is performed, showing a hypoechoic, irregular solid mass with angular margins. The mass measures  1.8 x 1.5 x 1.2 cm and demonstrates internal color Doppler flow.  Ultrasound of the inferior left breast is negative.  Ultrasound of the left axilla demonstrates 2 suspicious appearing level 1 lymph nodes. The largest of these lymph nodes measures 1.0 x 0.6 cm and has a thickened pole the cortex. The smaller lymph node is completely hypoechoic, with no visible fatty hilum and measures 0.6 cm.  IMPRESSION: Highly suspicious palpable mass 3 o'clock position of the left breast. This mass measures 1.8 cm greatest diameter. Two suspicious left axillary lymph nodes. The patient will be referred to [facility], as she does not have medical insurance.  RECOMMENDATION: Ultrasound-guided biopsies of the suspicious left breast mass and 1 of 2 suspicious left axillary lymph nodes is recommended. These biopsies will be performed after the patient has been enrolled in [facility].  I have discussed the findings and recommendations with the patient. Results were also provided in writing at the conclusion of the visit. If applicable, a reminder letter will be sent to the patient regarding the next appointment.  BI-RADS CATEGORY  5: Highly suggestive of malignancy.   Electronically Signed   By: Curlene Dolphin M.D.   On: 05/05/2014 12:12   [place] Lt Breast Bx W Loc Dev 1st Lesion Img Bx Spec US Guide  05/09/2014    CLINICAL DATA:  Patient presented with a palpable mass in the outer left breast and has a suspicious 1.8 cm mass (3 o'clock, 10 cm from the nipple) on recent imaging. She also has 2 morphologically abnormal lymph nodes on ultrasound.  EXAM: ULTRASOUND GUIDED LEFT BREAST CORE NEEDLE BIOPSY  COMPARISON:  Previous exams.  PROCEDURE: I met with the patient and we discussed the procedure of ultrasound-guided biopsy, including benefits and alternatives. We discussed the high likelihood of a successful procedure. We discussed the risks of the procedure including infection, bleeding, tissue injury, clip migration, and inadequate sampling. Informed written consent was given. The usual time-out protocol was performed immediately prior to the procedure.  Using sterile technique and 2% Lidocaine as local anesthetic, under direct ultrasound visualization, initially a 12 gauge Bard Marquee core needledevice was used to perform biopsy of the 1.8 cm mass in the outer left breast using an inferior approach. I then used a 12 gauge vacuum assisted device to obtain a final core sample. At the conclusion of the procedure, a coil shaped tissue marker clip was deployed into the biopsy cavity. Follow-up 2-view mammogram was performed and dictated separately.  IMPRESSION: Ultrasound-guided biopsy of the suspicious 1.8 cm mass in the outer left breast. No apparent complications.  The patient did not wish to proceed with biopsy of the left axillary lymph nodes today, despite my efforts to convince her to do so.   Electronically Signed   By: Evangeline Dakin M.D.   On: 05/09/2014 15:02      ASSESSMENT AND PLAN: Breast cancer of upper-outer quadrant of left female breast Pt has a clinical T1N0-1Mx breast cancer.  She does have abnormal appearing nodes on imaging, but these are not positive by pathology.  Her mass is clearly palpable, so we will do a lumpectomy with SLN Bx with intraoperative assessment and possible ALND.  The  surgical procedure was described to the patient.  I discussed the incision type and location and that we would need radiology involved on the day of surgery with a sentinel node injection.      The risks and benefits of the procedure were described to the patient and she  wishes to proceed.    We discussed the risks bleeding, infection, damage to other structures, need for further procedures/surgeries.  We discussed the risk of seroma.  The patient was advised that we may need to go back to surgery for additional tissue to obtain negative margins or for additional nodes. We discussed the rationale for intraoperative ALND.  The patient was advised that these are the most common complications, but that others can occur as well.  They were advised against taking aspirin or other anti-inflammatory agents/blood thinners the week before surgery.    Pt is very anxious and requested pain medication.  I advised her to add tylenol to her ibuprofen regimen.  I did not prescribe any narcotics.      She will need postoperative radiation.  If she is LN positive, she will need a port a cath for chemo.  If LN negative, we will need Oncotype DX.   45 min spent in evaluation, examination, counseling, and coordination of care.  >50% of time in counseling.    Milus Height MD Surgical Oncology, General and [facility]      Visit Diagnoses: 1. Breast cancer of upper-outer quadrant of left female breast     Primary Care Physician: No PCP Per Patient

## 2014-05-21 NOTE — Assessment & Plan Note (Signed)
Pt has a clinical T1N0-1Mx breast cancer.  She does have abnormal appearing nodes on imaging, but these are not positive by pathology.  Her mass is clearly palpable, so we will do a lumpectomy with SLN Bx with intraoperative assessment and possible ALND.  The surgical procedure was described to the patient.  I discussed the incision type and location and that we would need radiology involved on the day of surgery with a sentinel node injection.      The risks and benefits of the procedure were described to the patient and she wishes to proceed.    We discussed the risks bleeding, infection, damage to other structures, need for further procedures/surgeries.  We discussed the risk of seroma.  The patient was advised that we may need to go back to surgery for additional tissue to obtain negative margins or for additional nodes. We discussed the rationale for intraoperative ALND.  The patient was advised that these are the most common complications, but that others can occur as well.  They were advised against taking aspirin or other anti-inflammatory agents/blood thinners the week before surgery.    Pt is very anxious and requested pain medication.  I advised her to add tylenol to her ibuprofen regimen.  I did not prescribe any narcotics.

## 2014-05-21 NOTE — Telephone Encounter (Signed)
Called patient and asked questions for Medicaid Applications. Patient answered questions. Explained to patient [employer] Medicaid. Patient verbalized understanding.

## 2014-05-21 NOTE — Progress Notes (Unsigned)
Pt seen by RD during Breast Cancer Clinic on 05/21/2014  Pt without questions regarding nutrition during or after treatments. Encouraged pt to follow plant based diet with consumption of lean proteins and increased fruit/vegetable intake. Provided pt handouts regarding breast cancer nutrition myths, organic vs non-organic foods, antioxidant/plant-based diets, and soy.   Provided pt with outpatient oncology RD contact information and encouraged pt to follow up with any additional questions or concerns.  Atlee Abide MS RD LDN Clinical Dietitian [phone]

## 2014-05-21 NOTE — Progress Notes (Signed)
Radiation Oncology         [phone]) [phone] ________________________________  Initial outpatient Consultation - Date: 05/21/2014   Name: Laura Matthews MRN: [id number]   DOB: 1965/11/10  REFERRING PHYSICIAN: Stark Klein, MD  DIAGNOSIS: T1cN0 Invasive Ductal Carcinoma of the Left Breast  HISTORY OF PRESENT ILLNESS::Laura Matthews is a 48 y.o. female who palpated a left breast mass 2-3 months ago and presented to the emergency department due to breast pain, nausea, vomiting and headache.  She was found to have a mass on mammogram and on ultrasound this measured 1.8 x 1.5 x 1.2 cm on ultrasound. Several abnormal appearing lymph nodes were seen measuring 10 x 6 mm and biopsy was recommended She declined biopsy of the lymph node but did undergo biopsy of the primary mss which whowed a Grade 2 Invasive Ductal Carcinoma which was ER+PR+HER2- with Ki67 of 80%. MRI was performed which showed 1.7 x 1.7 x 1.6 cm with mildly abnormal appearing lymph nodes. She presents today for my recommendations regarding radiation in the management of her newly diagnosed breast cancer.   PREVIOUS RADIATION THERAPY: No  PAST MEDICAL HISTORY:  has a past medical history of Mental disorder and Depression.    PAST SURGICAL HISTORY: Past Surgical History  Procedure Laterality Date  . Knee surgery    . Foot surgery      FAMILY HISTORY:  Family History  Problem Relation Age of Onset  . Breast cancer Mother   . Cancer Mother     uterine  . Breast cancer Maternal Aunt     SOCIAL HISTORY:  History  Substance Use Topics  . Smoking status: Former Smoker -- 1.00 packs/day    Types: Cigarettes  . Smokeless tobacco: Never Used  . Alcohol Use: No     Comment: Percoset,Vicodin, Oxycotin    ALLERGIES: Norco  MEDICATIONS:  Current Outpatient Prescriptions  Medication Sig Dispense Refill  . HYDROcodone-acetaminophen (NORCO) 5-325 MG per tablet Take 1 tablet by mouth every 6 (six) hours as needed for moderate  pain.  15 tablet  0  . naproxen sodium (ANAPROX) 220 MG tablet Take 440 mg by mouth 3 (three) times daily as needed (For pain.).      Marland Kitchen promethazine (PHENERGAN) 25 MG suppository Place 1 suppository (25 mg total) rectally once.  1 each  0   No current facility-administered medications for this encounter.    REVIEW OF SYSTEMS:  A 15 point review of systems is documented in the electronic medical record. This was obtained by the nursing staff. However, I reviewed this with the patient to discuss relevant findings and make appropriate changes.  Pertinent items are noted in HPI.  PHYSICAL EXAM:  Pleasant female in no distress. Palpable left breast mass in the upper outer quadrant.   LABORATORY DATA:  Lab Results  Component Value Date   WBC 10.0 05/21/2014   HGB 12.9 05/21/2014   HCT 39.4 05/21/2014   MCV 90.8 05/21/2014   PLT 359 05/21/2014   Lab Results  Component Value Date   NA 134* 05/29/2011   K 4.1 05/29/2011   CL 98 05/29/2011   CO2 30 05/29/2011   Lab Results  Component Value Date   ALT 12 12/25/2010   AST 17 12/25/2010   ALKPHOS 46 12/25/2010   BILITOT 0.2* 12/25/2010     RADIOGRAPHY: Mr Breast Bilateral W Wo Contrast  05/16/2014   CLINICAL DATA:  48 year old female recently diagnosed with invasive mammary carcinoma in the 3 o'clock region of  the left breast. During her diagnostic workup abnormal left axillary adenopathy was imaged. The patient declined biopsy of the axillary adenopathy.  LABS:  None.  EXAM: BILATERAL BREAST MRI WITH AND WITHOUT CONTRAST  TECHNIQUE: Multiplanar, multisequence MR images of both breasts were obtained prior to and following the intravenous administration of 17m of MultiHance.  THREE-DIMENSIONAL MR IMAGE RENDERING ON INDEPENDENT WORKSTATION:  Three-dimensional MR images were rendered by post-processing of the original MR data on an independent workstation. The three-dimensional MR images were interpreted, and findings are reported in the following complete  MRI report for this study. Three dimensional images were evaluated at the independent DynaCad workstation  COMPARISON:  Mammograms dated 05/05/2014 and 05/09/2014.  FINDINGS: Breast composition: b.  Scattered fibroglandular tissue.  Background parenchymal enhancement: Moderate  Right breast: No mass or abnormal enhancement.  Left breast: In the posterior third of the 3 o'clock region of the left breast there is an irregular enhancing mass measuring 1.7 x 1.7 x 1.6 cm. Along the medial aspect of the mass is a signal void artifact corresponding with the biopsy clip. Post biopsy changes are visualized.  Lymph nodes: There are 3 level I left axillary lymph nodes that have thickened cortices worrisome for metastatic disease. The largest lymph node measures 1.3 cm.  Ancillary findings:  None.  IMPRESSION: 1.7 cm mass in the 3 o'clock region of the left breast corresponding with the recently diagnosed invasive mammary carcinoma. Three level I left axillary lymph nodes worrisome for metastatic disease.  RECOMMENDATION: Treatment planning of the known left breast invasive mammary carcinoma is recommended. [facility]. If left axillary lymph node ultrasound-guided biopsy is desired this can be scheduled.  BI-RADS CATEGORY  6: Known biopsy-proven malignancy.   Electronically Signed   By: DLillia MountainM.D.   On: 05/16/2014 12:29   Mm Digital Diagnostic Bilat  05/05/2014   CLINICAL DATA:  48year old patient with palpable area in the left breast in the 3 o'clock region. She palpates this mass today. Recently, she felt a possible mass in the 6 o'clock region of the left breast. She does not definitely feel this today. According to the patient, her mother has a history of breast cancer and later had her ovaries removed (for possible ovarian cancer).  EXAM: DIGITAL DIAGNOSTIC  BILATERAL MAMMOGRAM WITH CAD  ULTRASOUND LEFT BREAST  COMPARISON:   07/22/2009  ACR Breast Density Category b: There are scattered areas of fibroglandular density.  FINDINGS: Metallic skin marker was placed at the site of palpable concern in the outer left breast. The patient did not definitely is feel second area of recent palpable concern in the inferior left breast, so metallic marker was not placed in this region.  There is an irregular partially obscured mass in the outer left breast, subtending the metallic skin marker. No additional masses identified in the left breast. No mass is identified in the right breast. No suspicious microcalcification or distortion is seen in either breast. Skin appears normal.  Mammographic images were processed with CAD.  On physical exam, there is a firm fixed mass in the 3 o'clock position of the left breast approximately 10 cm from the nipple. I do not palpate a mass in the inferior left breast.  Ultrasound is performed, showing a hypoechoic, irregular solid mass with angular margins. The mass measures 1.8 x 1.5 x 1.2 cm and demonstrates internal color Doppler flow.  Ultrasound of the inferior left  breast is negative.  Ultrasound of the left axilla demonstrates 2 suspicious appearing level 1 lymph nodes. The largest of these lymph nodes measures 1.0 x 0.6 cm and has a thickened pole the cortex. The smaller lymph node is completely hypoechoic, with no visible fatty hilum and measures 0.6 cm.  IMPRESSION: Highly suspicious palpable mass 3 o'clock position of the left breast. This mass measures 1.8 cm greatest diameter. Two suspicious left axillary lymph nodes. The patient will be referred to [facility], as she does not have medical insurance.  RECOMMENDATION: Ultrasound-guided biopsies of the suspicious left breast mass and 1 of 2 suspicious left axillary lymph nodes is recommended. These biopsies will be performed after the patient has been enrolled in [place].  I have discussed the findings and recommendations with the patient. Results were also  provided in writing at the conclusion of the visit. If applicable, a reminder letter will be sent to the patient regarding the next appointment.  BI-RADS CATEGORY  5: Highly suggestive of malignancy.   Electronically Signed   By: Curlene Dolphin M.D.   On: 05/05/2014 12:12   Mm Digital Diagnostic Unilat L  05/12/2014   ADDENDUM REPORT: 05/12/2014 16:39  ADDENDUM: Pathology results of the left breast ultrasound-guided biopsy demonstrated invasive mammary carcinoma. Pathology results are concordant with imaging findings. Per the patient's request, I discussed pathology results with the patient's daughter, Jacqueli Pangallo. Candice reports no problems regarding the patient's biopsy site.  The patient declined a biopsy of suspicious left axillary lymph node at the time of her left breast biopsy, 05/09/2014. I discussed with Kerri Kovacik that her mother can discuss the possibility of the left axillary lymph node biopsy again with the surgeon she meets at the [facility], and if the biopsy is desired, we will certainly attempt a left axillary lymph node biopsy for treatment planning purposes.  The following appointments for Mickel Fuchs were given to [facility] over the telephone: Breast MRI is scheduled 05/16/2014 8:45 a.m.  The patient has an appointment scheduled of the [facility] at [facility] on 05/21/2014 at 8 o'clock a.m.   Electronically Signed   By: Curlene Dolphin M.D.   On: 05/12/2014 16:39   05/12/2014   CLINICAL DATA:  Ultrasound-guided core needle biopsy of a suspicious 1.8 cm mass in the outer left breast earlier today.  EXAM: DIAGNOSTIC LEFT MAMMOGRAM POST ULTRASOUND BIOPSY  COMPARISON:  Previous exams.  FINDINGS: Mammographic images were obtained following ultrasound guided biopsy of the 1.8 cm suspicious mass in the outer left breast. The coil shaped tissue marker clip is positioned at the anterior margin of the mass. Expected post biopsy changes  are present without evidence of hematoma.  IMPRESSION: Appropriate positioning of the coil shaped tissue marker clip at the anterior margin of the biopsied mass in the outer left breast.  Final Assessment: Post Procedure Mammograms for Marker Placement  Electronically Signed: By: Evangeline Dakin M.D. On: 05/09/2014 15:04   US Breast Ltd Uni Left Inc Axilla  05/05/2014   CLINICAL DATA:  48 year old patient with palpable area in the left breast in the 3 o'clock region. She palpates this mass today. Recently, she felt a possible mass in the 6 o'clock region of the left breast. She does not definitely feel this today. According to the patient, her mother has a history of breast cancer and later had her ovaries removed (for possible ovarian cancer).  EXAM: DIGITAL DIAGNOSTIC  BILATERAL MAMMOGRAM WITH CAD  ULTRASOUND LEFT BREAST  COMPARISON:  07/22/2009  ACR Breast Density Category b: There are scattered areas of fibroglandular density.  FINDINGS: Metallic skin marker was placed at the site of palpable concern in the outer left breast. The patient did not definitely is feel second area of recent palpable concern in the inferior left breast, so metallic marker was not placed in this region.  There is an irregular partially obscured mass in the outer left breast, subtending the metallic skin marker. No additional masses identified in the left breast. No mass is identified in the right breast. No suspicious microcalcification or distortion is seen in either breast. Skin appears normal.  Mammographic images were processed with CAD.  On physical exam, there is a firm fixed mass in the 3 o'clock position of the left breast approximately 10 cm from the nipple. I do not palpate a mass in the inferior left breast.  Ultrasound is performed, showing a hypoechoic, irregular solid mass with angular margins. The mass measures 1.8 x 1.5 x 1.2 cm and demonstrates internal color Doppler flow.  Ultrasound of the inferior left breast is  negative.  Ultrasound of the left axilla demonstrates 2 suspicious appearing level 1 lymph nodes. The largest of these lymph nodes measures 1.0 x 0.6 cm and has a thickened pole the cortex. The smaller lymph node is completely hypoechoic, with no visible fatty hilum and measures 0.6 cm.  IMPRESSION: Highly suspicious palpable mass 3 o'clock position of the left breast. This mass measures 1.8 cm greatest diameter. Two suspicious left axillary lymph nodes. The patient will be referred to [facility], as she does not have medical insurance.  RECOMMENDATION: Ultrasound-guided biopsies of the suspicious left breast mass and 1 of 2 suspicious left axillary lymph nodes is recommended. These biopsies will be performed after the patient has been enrolled in [place].  I have discussed the findings and recommendations with the patient. Results were also provided in writing at the conclusion of the visit. If applicable, a reminder letter will be sent to the patient regarding the next appointment.  BI-RADS CATEGORY  5: Highly suggestive of malignancy.   Electronically Signed   By: Curlene Dolphin M.D.   On: 05/05/2014 12:12   [place] Lt Breast Bx W Loc Dev 1st Lesion Img Bx Spec US Guide  05/09/2014   CLINICAL DATA:  Patient presented with a palpable mass in the outer left breast and has a suspicious 1.8 cm mass (3 o'clock, 10 cm from the nipple) on recent imaging. She also has 2 morphologically abnormal lymph nodes on ultrasound.  EXAM: ULTRASOUND GUIDED LEFT BREAST CORE NEEDLE BIOPSY  COMPARISON:  Previous exams.  PROCEDURE: I met with the patient and we discussed the procedure of ultrasound-guided biopsy, including benefits and alternatives. We discussed the high likelihood of a successful procedure. We discussed the risks of the procedure including infection, bleeding, tissue injury, clip migration, and inadequate sampling. Informed written consent was given. The usual time-out protocol was performed immediately prior to the procedure.   Using sterile technique and 2% Lidocaine as local anesthetic, under direct ultrasound visualization, initially a 12 gauge Bard Marquee core needledevice was used to perform biopsy of the 1.8 cm mass in the outer left breast using an inferior approach. I then used a 12 gauge vacuum assisted device to obtain a final core sample. At the conclusion of the procedure, a coil shaped tissue marker clip was deployed into the biopsy cavity. Follow-up 2-view mammogram was performed and dictated separately.  IMPRESSION: Ultrasound-guided biopsy of the suspicious  1.8 cm mass in the outer left breast. No apparent complications.  The patient did not wish to proceed with biopsy of the left axillary lymph nodes today, despite my efforts to convince her to do so.   Electronically Signed   By: Evangeline Dakin M.D.   On: 05/09/2014 15:02     IMPRESSION: T1cN0 Left Breast Cancer  PLAN: I spoke to the patient today regarding her diagnosis and options for treatment. We discussed the equivalence in terms of survival and local failure between mastectomy and breast conservation. We discussed the role of radiation in decreasing local failures in patients who undergo lumpectomy. We discussed possible treatment of her regional lymph nodes if she did not undergo full axillary node dissection and is found to have positive nodes.   We discussed the process of simulation and the placement tattoos. We discussed 4-6 weeks of treatment as an outpatient. We discussed the possibility of asymptomatic lung damage. We discussed the low likelihood of secondary malignancies. We discussed the possible side effects including but not limited to skin redness, fatigue, permanent skin darkening, and breast swelling. We discussed the process of simulation and the placement of tattoos. She met with medical oncology as well as a member of our patient family support team and our physical therapist. I will plan on seeing her back after her surgery.  I spent 40  minutes face to face with the patient and more than 50% of that time was spent in counseling and/or coordination of care.    ------------------------------------------------  Thea Silversmith, MD

## 2014-05-21 NOTE — Telephone Encounter (Signed)
lmonvm for pt re appt for 8/21. central will call pt re mri to be done @ [facility]. Schedule mailed.

## 2014-05-21 NOTE — Progress Notes (Signed)
Patient vomiting and requesting medication.  Order received for phenergan supp.  Patient refused suppository.

## 2014-05-21 NOTE — Progress Notes (Signed)
[place] Psychosocial Distress Screening Clinical Social Work  Patient complete distress screening protocol, and scored a 5 on the Psychosocial Distress Thermometer which indicates moderate distress. Clinical Education officer, museum met with pt and pt's daughter in [facility] to assess for distress and other psychosocial needs.  Pt was tearful and expressed feeling overwhelmed with the treatment plan.  CSW validated the pt's feelings and discussed looking at her treatment in stages and taking one step at a time.  CSw provided pt with information on the support team and support services at [facility].  CSW and pt also discussed transportation concerns.  CSw informed pt of SCAT and medicaid transportation pending her approval of Medicaid.  CSW provided contact information and encouraged her to call with questions or concerns.            Laura Matthews, MSW, [place] Worker [facility] [phone]

## 2014-05-21 NOTE — Patient Instructions (Signed)
We plan to do a left lumpectomy with sentinel lymph node biopsy and possible axillary lymph node dissection.    The main risks of surgery are bleeding, infection, damage to other structures, and seroma (accumulation of fluid) under the incision site(s).    These complications may lead to additional procedures such as drainage of seroma/infection.  You may need other surgeries to obtain negative margins or to take more lymph nodes.   Most women do accumulate fluid in the breast cavity where the specimen was removed. We do not always have to drain this fluid.  If your breast is very tense, painful, or red, then we may need to numb the skin and use a needle to aspirate the fluid.  We do provide patients with a Breast Binder.  The purpose of this is to avoid the use of tape on the sensitive tissue of the breast and to provide some compression to minimize the risk of seroma.  If the binder is uncomfortable, you may find that a tank top with a built-in shelf bra or a loose sports bra works better for you.  I recommend wearing this around the clock for the first 1-2 weeks except in the shower.    You may remove your dressings and may shower 48 hours after surgery IF YOU DO NOT HAVE A DRAIN.    Many patients have some constipation in the week after surgery due to the narcotics and anesthesia.  You may need over the counter stool softeners or laxatives if you experience difficulty having bowel movements.    If the following occur, call our office at [phone]: If you have a fever over 101 or pain that is severe despite narcotics. If you have redness or drainage at the wound. If you develop persistent nausea or vomiting.  I will follow you back up in 1-4 weeks.    Please submit any paperwork about time off work/insurance forms to the front desk.

## 2014-05-21 NOTE — Progress Notes (Signed)
Checked in new patient with no financial issues at this time. I gave her medicaid app. She has [facility] right now. She has appt card and has not been out of the country. I have her breast care alliance packet and advised her of [employer] fund.

## 2014-05-21 NOTE — Progress Notes (Signed)
[place]  [phone] [phone]) [phone]     ID: Laura Matthews DOB: [birth year]  MR#: [id number]  UQJ#:[id number]  Patient Care Team: No Pcp Per Patient as PCP - General (General Practice) Laura Klein, MD as Consulting Physician (General Surgery) Laura Cruel, MD as Consulting Physician (Oncology) Laura Silversmith, MD as Consulting Physician (Radiation Oncology) Laura Murrain, NP as Nurse Practitioner (Nurse Practitioner)  CHIEF COMPLAINT: Newly diagnosed breast cancer  CURRENT TREATMENT: Pending definitive surgery   BREAST CANCER HISTORY: The patient herself palpated a mass in her left breast and brought it to the attention of [facility] NP 04/22/2014. She palpated a large nontender mass at the 4:00 position as well as a small tender mass in the 6:00 position of the left breast. The patient was set up for imaging at the breast Center where on 05/05/2014 she underwent bilateral diagnostic mammography and left breast ultrasonography. The mammogram showed an irregular mass in the outer left breast with no other suspicious findings. This was firm and fixed at the 3:00 position approximately 10 cm from the nipple. Ultrasound showed a hypoechoic irregular solid mass in this area, measuring 1.8 cm. Ultrasound of the left axilla showed 2 suspicious level I lymph nodes, the largest measuring 1 cm, with a thickened pole. A smaller lymph node measured 0.6 cm but had no visible fatty hilum.  On 05/09/2014 the patient underwent biopsy of the left breast mass (she refused biopsy of the left axilla) which showed ([id number]) and invasive ductal carcinoma, grade 2, estrogen receptor positive at 100%, with strong staining intensity, progesterone receptor 80% positive, with weak staining intensity, with an MIB-1 of 80%, and no HER-2 amplification.  On 05/16/2014 the patient underwent bilateral breast MRI. This showed a 1.7 cm irregular enhancing mass at the 3:00 position  of the left breast associated with the biopsy cleft. There were 3 level I left axillary lymph nodes with thickened cortices. The largest measured 1.3 cm. There were no other findings of concern.  Her subsequent history is as detailed below.  INTERVAL HISTORY: Laura Matthews was evaluated in the multidisciplinary breast cancer clinic 05/21/2014 accompanied by her daughter Laura Matthews.  REVIEW OF SYSTEMS: Aside from the mass itself, and there were no suspicious symptoms leading to the diagnostic mammogram. The patient does have new onset headaches, which she says have been waking her up every morning for the last 2 weeks. She did not have these a month ago. She's also having some nausea and vomiting problems. She also has a history of pain, which is poorly localized. The patient tells me she has been having fever, chills, night sweats, fatigue, insomnia, poor appetite, joint pain, weakness, and hot flashes in addition to the other symptoms noted above. A detailed review of systems was otherwise entirely negative.   PAST MEDICAL HISTORY: Past Medical History  Diagnosis Date  . Mental disorder   . Depression   . Arthritis   . Hot flashes   . Head ache     PAST SURGICAL HISTORY: Past Surgical History  Procedure Laterality Date  . Knee surgery    . Foot surgery      FAMILY HISTORY Family History  Problem Relation Age of Onset  . Breast cancer Mother   . Cancer Mother     uterine  . Breast cancer Maternal Aunt   . Lung cancer Father    the patient's father died at the age of 31 from lung cancer. The patient's mother was diagnosed with  breast cancer at the age of 53. She survives. The patient had 2 brothers, and 2 sisters. One brother may have a diagnosis of "stomach cancer"  GYNECOLOGIC HISTORY:  No LMP recorded. Patient has had an ablation. Menarche age 44 which is also when the patient first had a child. She is GX P3. She stopped having periods after laser ablation in 2005.  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 At home she lives with her daughter Laura Matthews and her 4 children who are 66, 8, 5, and for. 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 The patient's sonSrrcori is a fast food cook in [place] and her daughter Laura Matthews works as a Scientist, water quality, also in [place]. The patient has 7 grandchildren. She is not a church attender    ADVANCED DIRECTIVES: Not in place; this was discussed with the patient on her first visit, 05/21/2014, and she indicated she plans to name her daughter Laura Matthews as her healthcare power of attorney   HEALTH MAINTENANCE: History  Substance Use Topics  . Smoking status: Current Every Day Smoker -- 1.00 packs/day    Types: Cigarettes  . Smokeless tobacco: Never Used  . Alcohol Use: No     Comment: Percoset,Vicodin, Oxycotin     Colonoscopy:  PAP:  Bone density:  Lipid panel:  Allergies  Allergen Reactions  . Norco [Hydrocodone-Acetaminophen] Itching and Nausea Only    Pt reported allergy.    Current Outpatient Prescriptions  Medication Sig Dispense Refill  . HYDROcodone-acetaminophen (NORCO) 5-325 MG per tablet Take 1 tablet by mouth every 6 (six) hours as needed for moderate pain.  15 tablet  0  . naproxen sodium (ANAPROX) 220 MG tablet Take 440 mg by mouth 3 (three) times daily as needed (For pain.).      Marland Kitchen promethazine (PHENERGAN) 25 MG suppository Place 1 suppository (25 mg total) rectally once.  1 each  0   No current facility-administered medications for this visit.    OBJECTIVE: Middle-aged [place] American woman in no acute distress Filed Vitals:   05/21/14 0904  BP: 154/82  Pulse: 72  Temp: 98.2 F (36.8 C)  Resp: 20     Body mass index is 24.91 kg/(m^2).    ECOG FS:1 - Symptomatic but completely ambulatory  Ocular: Sclerae unicteric, pupils equal, round and reactive to light Ear-nose-throat: Oropharynx clear and moist Lymphatic: No cervical or supraclavicular  adenopathy Lungs no rales or rhonchi, good excursion bilaterally Heart regular rate and rhythm, no murmur appreciated Abd soft, nontender, positive bowel sounds MSK no focal spinal tenderness, no upper extremity lymphedema Neuro: non-focal, well-oriented, appropriate affect Breasts: The right breast is unremarkable. In the left breast, upper outer quadrant, laterally, there is an easily movable mass measuring perhaps 2 cm. I do not palpate any left axillary adenopathy.   LAB RESULTS:  CMP     Component Value Date/Time   NA 138 05/21/2014 0837   NA 134* 05/29/2011 0136   K 4.0 05/21/2014 0837   K 4.1 05/29/2011 0136   CL 98 05/29/2011 0136   CO2 24 05/21/2014 0837   CO2 30 05/29/2011 0136   GLUCOSE 214* 05/21/2014 0837   GLUCOSE 105* 05/29/2011 0136   BUN 8.1 05/21/2014 0837   BUN 9 05/29/2011 0136   CREATININE 0.7 05/21/2014 0837   CREATININE <0.47* 05/29/2011 0136   CALCIUM 9.1 05/21/2014 0837   CALCIUM 9.6 05/29/2011 0136   PROT 7.3 05/21/2014 0837   PROT  6.9 12/25/2010 1415   ALBUMIN 4.0 05/21/2014 0837   ALBUMIN 4.1 12/25/2010 1415   AST 19 05/21/2014 0837   AST 17 12/25/2010 1415   ALT 23 05/21/2014 0837   ALT 12 12/25/2010 1415   ALKPHOS 57 05/21/2014 0837   ALKPHOS 46 12/25/2010 1415   BILITOT 0.34 05/21/2014 0837   BILITOT 0.2* 12/25/2010 1415   GFRNONAA NOT CALCULATED 05/29/2011 0136   GFRAA NOT CALCULATED 05/29/2011 0136    I No results found for this basename: SPEP,  UPEP,   kappa and lambda light chains    Lab Results  Component Value Date   WBC 10.0 05/21/2014   NEUTROABS 6.8* 05/21/2014   HGB 12.9 05/21/2014   HCT 39.4 05/21/2014   MCV 90.8 05/21/2014   PLT 359 05/21/2014      Chemistry      Component Value Date/Time   NA 138 05/21/2014 0837   NA 134* 05/29/2011 0136   K 4.0 05/21/2014 0837   K 4.1 05/29/2011 0136   CL 98 05/29/2011 0136   CO2 24 05/21/2014 0837   CO2 30 05/29/2011 0136   BUN 8.1 05/21/2014 0837   BUN 9 05/29/2011 0136   CREATININE 0.7 05/21/2014 0837    CREATININE <0.47* 05/29/2011 0136      Component Value Date/Time   CALCIUM 9.1 05/21/2014 0837   CALCIUM 9.6 05/29/2011 0136   ALKPHOS 57 05/21/2014 0837   ALKPHOS 46 12/25/2010 1415   AST 19 05/21/2014 0837   AST 17 12/25/2010 1415   ALT 23 05/21/2014 0837   ALT 12 12/25/2010 1415   BILITOT 0.34 05/21/2014 0837   BILITOT 0.2* 12/25/2010 1415       No results found for this basename: LABCA2    No components found with this basename: [id number]    No results found for this basename: INR,  in the last 168 hours  Urinalysis    Component Value Date/Time   COLORURINE YELLOW 04/22/2014 1616   APPEARANCEUR HAZY* 04/22/2014 1616   LABSPEC 1.025 04/22/2014 1616   PHURINE 6.0 04/22/2014 1616   GLUCOSEU NEGATIVE 04/22/2014 1616   HGBUR NEGATIVE 04/22/2014 1616   BILIRUBINUR NEGATIVE 04/22/2014 1616   KETONESUR NEGATIVE 04/22/2014 1616   PROTEINUR NEGATIVE 04/22/2014 1616   UROBILINOGEN 0.2 04/22/2014 1616   NITRITE NEGATIVE 04/22/2014 1616   LEUKOCYTESUR NEGATIVE 04/22/2014 1616    STUDIES: Mr Breast Bilateral W Wo Contrast  05/16/2014   CLINICAL DATA:  48 year old female recently diagnosed with invasive mammary carcinoma in the 3 o'clock region of the left breast. During her diagnostic workup abnormal left axillary adenopathy was imaged. The patient declined biopsy of the axillary adenopathy.  LABS:  None.  EXAM: BILATERAL BREAST MRI WITH AND WITHOUT CONTRAST  TECHNIQUE: Multiplanar, multisequence MR images of both breasts were obtained prior to and following the intravenous administration of 26m of MultiHance.  THREE-DIMENSIONAL MR IMAGE RENDERING ON INDEPENDENT WORKSTATION:  Three-dimensional MR images were rendered by post-processing of the original MR data on an independent workstation. The three-dimensional MR images were interpreted, and findings are reported in the following complete MRI report for this study. Three dimensional images were evaluated at the independent DynaCad workstation   COMPARISON:  Mammograms dated 05/05/2014 and 05/09/2014.  FINDINGS: Breast composition: b.  Scattered fibroglandular tissue.  Background parenchymal enhancement: Moderate  Right breast: No mass or abnormal enhancement.  Left breast: In the posterior third of the 3 o'clock region of the left breast there is an irregular enhancing mass measuring 1.7  x 1.7 x 1.6 cm. Along the medial aspect of the mass is a signal void artifact corresponding with the biopsy clip. Post biopsy changes are visualized.  Lymph nodes: There are 3 level I left axillary lymph nodes that have thickened cortices worrisome for metastatic disease. The largest lymph node measures 1.3 cm.  Ancillary findings:  None.  IMPRESSION: 1.7 cm mass in the 3 o'clock region of the left breast corresponding with the recently diagnosed invasive mammary carcinoma. Three level I left axillary lymph nodes worrisome for metastatic disease.  RECOMMENDATION: Treatment planning of the known left breast invasive mammary carcinoma is recommended. The patient is being seen at the [facility] on 05/21/2014. If left axillary lymph node ultrasound-guided biopsy is desired this can be scheduled.  BI-RADS CATEGORY  6: Known biopsy-proven malignancy.   Electronically Signed   By: Lillia Mountain M.D.   On: 05/16/2014 12:29   Mm Digital Diagnostic Bilat  05/05/2014   CLINICAL DATA:  48 year old patient with palpable area in the left breast in the 3 o'clock region. She palpates this mass today. Recently, she felt a possible mass in the 6 o'clock region of the left breast. She does not definitely feel this today. According to the patient, her mother has a history of breast cancer and later had her ovaries removed (for possible ovarian cancer).  EXAM: DIGITAL DIAGNOSTIC  BILATERAL MAMMOGRAM WITH CAD  ULTRASOUND LEFT BREAST  COMPARISON:  07/22/2009  ACR Breast Density Category b: There are scattered areas of fibroglandular density.   FINDINGS: Metallic skin marker was placed at the site of palpable concern in the outer left breast. The patient did not definitely is feel second area of recent palpable concern in the inferior left breast, so metallic marker was not placed in this region.  There is an irregular partially obscured mass in the outer left breast, subtending the metallic skin marker. No additional masses identified in the left breast. No mass is identified in the right breast. No suspicious microcalcification or distortion is seen in either breast. Skin appears normal.  Mammographic images were processed with CAD.  On physical exam, there is a firm fixed mass in the 3 o'clock position of the left breast approximately 10 cm from the nipple. I do not palpate a mass in the inferior left breast.  Ultrasound is performed, showing a hypoechoic, irregular solid mass with angular margins. The mass measures 1.8 x 1.5 x 1.2 cm and demonstrates internal color Doppler flow.  Ultrasound of the inferior left breast is negative.  Ultrasound of the left axilla demonstrates 2 suspicious appearing level 1 lymph nodes. The largest of these lymph nodes measures 1.0 x 0.6 cm and has a thickened pole the cortex. The smaller lymph node is completely hypoechoic, with no visible fatty hilum and measures 0.6 cm.  IMPRESSION: Highly suspicious palpable mass 3 o'clock position of the left breast. This mass measures 1.8 cm greatest diameter. Two suspicious left axillary lymph nodes. The patient will be referred to [facility], as she does not have medical insurance.  RECOMMENDATION: Ultrasound-guided biopsies of the suspicious left breast mass and 1 of 2 suspicious left axillary lymph nodes is recommended. These biopsies will be performed after the patient has been enrolled in [place].  I have discussed the findings and recommendations with the patient. Results were also provided in writing at the conclusion of the visit. If applicable, a reminder letter will be sent to  the patient regarding the next appointment.  BI-RADS CATEGORY  5: Highly suggestive of malignancy.   Electronically Signed   By: Curlene Dolphin M.D.   On: 05/05/2014 12:12   Mm Digital Diagnostic Unilat L  05/12/2014   ADDENDUM REPORT: 05/12/2014 16:39  ADDENDUM: Pathology results of the left breast ultrasound-guided biopsy demonstrated invasive mammary carcinoma. Pathology results are concordant with imaging findings. Per the patient's request, I discussed pathology results with the patient's daughter, Shanika Levings. Candice reports no problems regarding the patient's biopsy site.  The patient declined a biopsy of suspicious left axillary lymph node at the time of her left breast biopsy, 05/09/2014. I discussed with Aundreya Souffrant that her mother can discuss the possibility of the left axillary lymph node biopsy again with the surgeon she meets at the [facility], and if the biopsy is desired, we will certainly attempt a left axillary lymph node biopsy for treatment planning purposes.  The following appointments for Mickel Fuchs were given to [facility] over the telephone: Breast MRI is scheduled 05/16/2014 8:45 a.m.  The patient has an appointment scheduled of the [facility] at [facility] on 05/21/2014 at 8 o'clock a.m.   Electronically Signed   By: Curlene Dolphin M.D.   On: 05/12/2014 16:39   05/12/2014   CLINICAL DATA:  Ultrasound-guided core needle biopsy of a suspicious 1.8 cm mass in the outer left breast earlier today.  EXAM: DIAGNOSTIC LEFT MAMMOGRAM POST ULTRASOUND BIOPSY  COMPARISON:  Previous exams.  FINDINGS: Mammographic images were obtained following ultrasound guided biopsy of the 1.8 cm suspicious mass in the outer left breast. The coil shaped tissue marker clip is positioned at the anterior margin of the mass. Expected post biopsy changes are present without evidence of hematoma.  IMPRESSION: Appropriate positioning of the coil shaped  tissue marker clip at the anterior margin of the biopsied mass in the outer left breast.  Final Assessment: Post Procedure Mammograms for Marker Placement  Electronically Signed: By: Evangeline Dakin M.D. On: 05/09/2014 15:04   US Breast Ltd Uni Left Inc Axilla  05/05/2014   CLINICAL DATA:  48 year old patient with palpable area in the left breast in the 3 o'clock region. She palpates this mass today. Recently, she felt a possible mass in the 6 o'clock region of the left breast. She does not definitely feel this today. According to the patient, her mother has a history of breast cancer and later had her ovaries removed (for possible ovarian cancer).  EXAM: DIGITAL DIAGNOSTIC  BILATERAL MAMMOGRAM WITH CAD  ULTRASOUND LEFT BREAST  COMPARISON:  07/22/2009  ACR Breast Density Category b: There are scattered areas of fibroglandular density.  FINDINGS: Metallic skin marker was placed at the site of palpable concern in the outer left breast. The patient did not definitely is feel second area of recent palpable concern in the inferior left breast, so metallic marker was not placed in this region.  There is an irregular partially obscured mass in the outer left breast, subtending the metallic skin marker. No additional masses identified in the left breast. No mass is identified in the right breast. No suspicious microcalcification or distortion is seen in either breast. Skin appears normal.  Mammographic images were processed with CAD.  On physical exam, there is a firm fixed mass in the 3 o'clock position of the left breast approximately 10 cm from the nipple. I do not palpate a mass in the inferior left breast.  Ultrasound is performed, showing a hypoechoic, irregular solid mass with angular margins.  The mass measures 1.8 x 1.5 x 1.2 cm and demonstrates internal color Doppler flow.  Ultrasound of the inferior left breast is negative.  Ultrasound of the left axilla demonstrates 2 suspicious appearing level 1 lymph nodes.  The largest of these lymph nodes measures 1.0 x 0.6 cm and has a thickened pole the cortex. The smaller lymph node is completely hypoechoic, with no visible fatty hilum and measures 0.6 cm.  IMPRESSION: Highly suspicious palpable mass 3 o'clock position of the left breast. This mass measures 1.8 cm greatest diameter. Two suspicious left axillary lymph nodes. The patient will be referred to [facility], as she does not have medical insurance.  RECOMMENDATION: Ultrasound-guided biopsies of the suspicious left breast mass and 1 of 2 suspicious left axillary lymph nodes is recommended. These biopsies will be performed after the patient has been enrolled in [place].  I have discussed the findings and recommendations with the patient. Results were also provided in writing at the conclusion of the visit. If applicable, a reminder letter will be sent to the patient regarding the next appointment.  BI-RADS CATEGORY  5: Highly suggestive of malignancy.   Electronically Signed   By: Curlene Dolphin M.D.   On: 05/05/2014 12:12   [place] Lt Breast Bx W Loc Dev 1st Lesion Img Bx Spec US Guide  05/09/2014   CLINICAL DATA:  Patient presented with a palpable mass in the outer left breast and has a suspicious 1.8 cm mass (3 o'clock, 10 cm from the nipple) on recent imaging. She also has 2 morphologically abnormal lymph nodes on ultrasound.  EXAM: ULTRASOUND GUIDED LEFT BREAST CORE NEEDLE BIOPSY  COMPARISON:  Previous exams.  PROCEDURE: I met with the patient and we discussed the procedure of ultrasound-guided biopsy, including benefits and alternatives. We discussed the high likelihood of a successful procedure. We discussed the risks of the procedure including infection, bleeding, tissue injury, clip migration, and inadequate sampling. Informed written consent was given. The usual time-out protocol was performed immediately prior to the procedure.  Using sterile technique and 2% Lidocaine as local anesthetic, under direct ultrasound  visualization, initially a 12 gauge Bard Marquee core needledevice was used to perform biopsy of the 1.8 cm mass in the outer left breast using an inferior approach. I then used a 12 gauge vacuum assisted device to obtain a final core sample. At the conclusion of the procedure, a coil shaped tissue marker clip was deployed into the biopsy cavity. Follow-up 2-view mammogram was performed and dictated separately.  IMPRESSION: Ultrasound-guided biopsy of the suspicious 1.8 cm mass in the outer left breast. No apparent complications.  The patient did not wish to proceed with biopsy of the left axillary lymph nodes today, despite my efforts to convince her to do so.   Electronically Signed   By: Evangeline Dakin M.D.   On: 05/09/2014 15:02    ASSESSMENT: 48 y.o. [place] woman status post left breast upper outer quadrant biopsy 05/09/2014 for a clinicalT1c N1, stage IIA invasive ductal carcinoma, grade 2, estrogen and progesterone receptor positive, HER-2 nonamplified, with an MIB-1 of 80%  (1) genetics testing is pending  (2) tobacco abuse. The patient has been strongly urged to discontinue smoking.  PLAN: We spent the better part of today's hour-long appointment discussing the biology of breast cancer in general, and the specifics of the patient's tumor in particular. Zayana understands the difference between local and systemic treatment and in terms of local treatment the plan is to proceed to breast conserving surgery  with sentinel lymph node sampling. If the lymph nodes proved to be positive intraoperatively Dr. Barry Dienes plans to proceed to full axillary lymph node dissection. Radiation and would eventually follow.  With regards to systemic therapy, Kyndra clearly will benefit from antiestrogen to. The question regarding chemotherapy is more complex. If she is indeed node positive, she will need chemotherapy. In the unlikely event that the lymph nodes they proved to be negative, we would send an Oncotype  as suggested by NCCN guidelines.  All this was discussed with [name]. Because she tells me she has been having persistent headaches now for more than 2 weeks, associated with nausea, and that these are new symptom, as well as because some of the other difficult to evaluate pain areas, we are going to obtain a PET scan and brain MRI to make sure we are not dealing with stage IV disease in this case.  Lavada will see me again one or 2 weeks after her definitive surgery. She has a good understanding of the overall plan. She agrees with it. She knows the goal of treatment in her case is cure. She will call with any problems that may develop before her next visit here.  Laura Cruel, MD   05/21/2014 11:36 AM

## 2014-05-27 ENCOUNTER — Telehealth: Payer: Self-pay | Admitting: *Deleted

## 2014-05-27 NOTE — Telephone Encounter (Signed)
Spoke to pt daughter Candice about [place] from 05/21/14. Confirmed future appts and surgery date. Candice relayed that mother still have HA and nausea, but doing well. Encourage daughter to call with questions or needs. Received verbal understanding. Contact information given.

## 2014-05-29 ENCOUNTER — Ambulatory Visit (HOSPITAL_COMMUNITY)
Admission: RE | Admit: 2014-05-29 | Discharge: 2014-05-29 | Disposition: A | Discharge status: Home / Self Care | Payer: Medicaid Other | Source: Ambulatory Visit | Attending: Oncology | Admitting: Oncology

## 2014-05-29 ENCOUNTER — Ambulatory Visit (HOSPITAL_BASED_OUTPATIENT_CLINIC_OR_DEPARTMENT_OTHER): Payer: Medicaid Other | Admitting: Genetic Counselor

## 2014-05-29 ENCOUNTER — Encounter (HOSPITAL_COMMUNITY): Payer: Self-pay

## 2014-05-29 ENCOUNTER — Other Ambulatory Visit: Payer: No Typology Code available for payment source

## 2014-05-29 ENCOUNTER — Encounter: Payer: Self-pay | Admitting: Genetic Counselor

## 2014-05-29 DIAGNOSIS — C50419 Malignant neoplasm of upper-outer quadrant of unspecified female breast: Secondary | ICD-10-CM | POA: Insufficient documentation

## 2014-05-29 DIAGNOSIS — C773 Secondary and unspecified malignant neoplasm of axilla and upper limb lymph nodes: Secondary | ICD-10-CM | POA: Insufficient documentation

## 2014-05-29 DIAGNOSIS — IMO0002 Reserved for concepts with insufficient information to code with codable children: Secondary | ICD-10-CM

## 2014-05-29 DIAGNOSIS — C50412 Malignant neoplasm of upper-outer quadrant of left female breast: Secondary | ICD-10-CM

## 2014-05-29 DIAGNOSIS — Z803 Family history of malignant neoplasm of breast: Secondary | ICD-10-CM | POA: Insufficient documentation

## 2014-05-29 DIAGNOSIS — C50919 Malignant neoplasm of unspecified site of unspecified female breast: Secondary | ICD-10-CM

## 2014-05-29 LAB — GLUCOSE, CAPILLARY: Glucose-Capillary: 151 mg/dL — ABNORMAL HIGH (ref 70–99)

## 2014-05-29 MED ORDER — FLUDEOXYGLUCOSE F - 18 (FDG) INJECTION: 9.9000 | Freq: Once | INTRAVENOUS | Status: AC | PRN

## 2014-05-29 NOTE — Progress Notes (Signed)
Patient Name: Laura Matthews Patient Age: 48 y.o. Encounter Date: 05/29/2014  Referring Physician: Stark Klein, MD  Primary Care Provider: No PCP Per Patient   Laura Matthews, a 48 y.o. female, is being seen at the [facility] due to a personal and family history of breast cancer.  She presents to clinic today with her daughter, [name], to discuss the possibility of a hereditary predisposition to cancer and discuss whether genetic testing is warranted.  HISTORY OF PRESENT ILLNESS: Laura Matthews was recently diagnosed with left breast cancer (IDC) at the age of 17. She has not yet decided regarding her surgery.  The breast tumor was ER positive, PR positive, and HER2 negative.   Past Medical History  Diagnosis Date  . Mental disorder   . Depression   . Arthritis   . Hot flashes   . Head ache   . Malignant neoplasm of breast (female), unspecified site   . Family history of malignant neoplasm of breast     Past Surgical History  Procedure Laterality Date  . Knee surgery    . Foot surgery      History   Social History  . Marital Status: Single    Spouse Name: N/A    Number of Children: N/A  . Years of Education: N/A   Social History Main Topics  . Smoking status: Current Every Day Smoker -- 1.00 packs/day    Types: Cigarettes  . Smokeless tobacco: Never Used  . Alcohol Use: No     Comment: Percoset,Vicodin, Oxycotin  . Drug Use: No  . Sexual Activity: No   Other Topics Concern  . Not on file   Social History Narrative  . No narrative on file     FAMILY HISTORY:   During the visit, a 4-generation pedigree was obtained. Significant diagnoses include the following:  Family History  Problem Relation Age of Onset  . Breast cancer Mother 84    currently 77; TAH/BSO d/t ?cervical ca at 11  . Breast cancer Maternal Aunt     dx 14s; currently in her late 38s  . Lung cancer Father     deceased 83    Additionally, her mother has 32 sisters and  7 brothers. Other than the one maternal aunt with breast cancer in her 67s, there are no other cancers in any maternal relatives, including her numerous cousins. She has no information regarding her paternal history.  Laura Matthews ancestry is African American. There is no known Jewish ancestry and no consanguinity.  ASSESSMENT AND PLAN: Laura Matthews is a 49 y.o. female with a personal and family history of breast cancer. This history is not highly suggestive of a hereditary predisposition to cancer given the ages at diagnosis in her, her mother and her maternal aunt, in combination with her large family and many unaffected female relatives. However, genetic testing is recommended as this may be due to a lower penetrace mutation. We reviewed the characteristics, features and inheritance patterns of hereditary cancer syndromes.   Laura Matthews did not wish to pursue genetic testing today. We continue to recommend testing, and remain available to coordinate testing at any time in the future.   We encouraged [name] to remain in contact with Cancer Genetics annually so that we can update the family history and inform her of any changes in cancer genetics and testing that may be of benefit for this family. Ms.  Matthews questions were answered to her satisfaction today.   Thank  you for the referral and allowing [place] to share in the care of your patient.   The patient was seen for a total of 30 minutes, greater than 50% of which was spent face-to-face counseling. This patient was discussed with the overseeing provider who agrees with the above.   Steele Berg, MS, [place] Certified Genetic Counseor phone: [phone] [email or web address]

## 2014-06-01 ENCOUNTER — Other Ambulatory Visit: Payer: Self-pay | Admitting: Oncology

## 2014-06-01 NOTE — Progress Notes (Unsigned)
patient opted against genetic testing July 2015

## 2014-06-02 ENCOUNTER — Other Ambulatory Visit: Payer: Self-pay | Admitting: Oncology

## 2014-06-02 ENCOUNTER — Ambulatory Visit (HOSPITAL_COMMUNITY)
Admission: RE | Admit: 2014-06-02 | Discharge: 2014-06-02 | Disposition: A | Discharge status: Home / Self Care | Payer: Medicaid Other | Source: Ambulatory Visit | Attending: Oncology | Admitting: Oncology

## 2014-06-02 DIAGNOSIS — C50919 Malignant neoplasm of unspecified site of unspecified female breast: Secondary | ICD-10-CM | POA: Insufficient documentation

## 2014-06-02 DIAGNOSIS — C50412 Malignant neoplasm of upper-outer quadrant of left female breast: Secondary | ICD-10-CM

## 2014-06-02 MED ORDER — GADOBENATE DIMEGLUMINE 529 MG/ML IV SOLN
15.0000 mL | Freq: Once | INTRAVENOUS | Status: AC | PRN
  Administered 2014-06-02: 15 mL via INTRAVENOUS

## 2014-06-03 ENCOUNTER — Encounter (HOSPITAL_COMMUNITY): Payer: Self-pay | Admitting: *Deleted

## 2014-06-03 ENCOUNTER — Inpatient Hospital Stay (HOSPITAL_COMMUNITY)
Admission: AD | Admit: 2014-06-03 | Discharge: 2014-06-03 | Disposition: A | Discharge status: Home / Self Care | Payer: Medicaid Other | Source: Ambulatory Visit | Attending: Obstetrics & Gynecology | Admitting: Obstetrics & Gynecology

## 2014-06-03 ENCOUNTER — Inpatient Hospital Stay (HOSPITAL_COMMUNITY): Payer: Medicaid Other

## 2014-06-03 DIAGNOSIS — Z803 Family history of malignant neoplasm of breast: Secondary | ICD-10-CM | POA: Insufficient documentation

## 2014-06-03 DIAGNOSIS — F329 Major depressive disorder, single episode, unspecified: Secondary | ICD-10-CM | POA: Diagnosis not present

## 2014-06-03 DIAGNOSIS — R112 Nausea with vomiting, unspecified: Secondary | ICD-10-CM

## 2014-06-03 DIAGNOSIS — R109 Unspecified abdominal pain: Secondary | ICD-10-CM | POA: Diagnosis present

## 2014-06-03 DIAGNOSIS — F17200 Nicotine dependence, unspecified, uncomplicated: Secondary | ICD-10-CM | POA: Insufficient documentation

## 2014-06-03 DIAGNOSIS — F3289 Other specified depressive episodes: Secondary | ICD-10-CM | POA: Insufficient documentation

## 2014-06-03 DIAGNOSIS — R1013 Epigastric pain: Secondary | ICD-10-CM

## 2014-06-03 DIAGNOSIS — C50919 Malignant neoplasm of unspecified site of unspecified female breast: Secondary | ICD-10-CM | POA: Diagnosis not present

## 2014-06-03 LAB — URINALYSIS, ROUTINE W REFLEX MICROSCOPIC
Glucose, UA: NEGATIVE mg/dL
Ketones, ur: NEGATIVE mg/dL
NITRITE: NEGATIVE
Protein, ur: NEGATIVE mg/dL
UROBILINOGEN UA: 0.2 mg/dL (ref 0.0–1.0)
pH: 6 (ref 5.0–8.0)

## 2014-06-03 LAB — COMPREHENSIVE METABOLIC PANEL
ALBUMIN: 4.7 g/dL (ref 3.5–5.2)
ALT: 21 U/L (ref 0–35)
ANION GAP: 13 (ref 5–15)
AST: 21 U/L (ref 0–37)
Alkaline Phosphatase: 75 U/L (ref 39–117)
BUN: 14 mg/dL (ref 6–23)
CO2: 30 mEq/L (ref 19–32)
CREATININE: 0.63 mg/dL (ref 0.50–1.10)
Calcium: 10.2 mg/dL (ref 8.4–10.5)
Chloride: 95 mEq/L — ABNORMAL LOW (ref 96–112)
GFR calc Af Amer: >90 mL/min (ref >90)
GFR calc non Af Amer: >90 mL/min (ref >90)
Glucose, Bld: 114 mg/dL — ABNORMAL HIGH (ref 70–99)
Potassium: 3.5 mEq/L — ABNORMAL LOW (ref 3.7–5.3)
Sodium: 138 mEq/L (ref 137–147)
Total Bilirubin: 0.5 mg/dL (ref 0.3–1.2)
Total Protein: 8.6 g/dL — ABNORMAL HIGH (ref 6.0–8.3)

## 2014-06-03 LAB — CBC WITH DIFFERENTIAL/PLATELET
BASOS ABS: 0 10*3/uL (ref 0.0–0.1)
BASOS PCT: 0 % (ref 0–1)
EOS PCT: 0 % (ref 0–5)
Eosinophils Absolute: 0.1 10*3/uL (ref 0.0–0.7)
HEMATOCRIT: 44.1 % (ref 36.0–46.0)
Hemoglobin: 15.1 g/dL — ABNORMAL HIGH (ref 12.0–15.0)
Lymphocytes Relative: 21 % (ref 12–46)
Lymphs Abs: 3 10*3/uL (ref 0.7–4.0)
MCH: 30.3 pg (ref 26.0–34.0)
MCHC: 34.2 g/dL (ref 30.0–36.0)
MCV: 88.4 fL (ref 78.0–100.0)
MONO ABS: 1.1 10*3/uL — AB (ref 0.1–1.0)
Monocytes Relative: 8 % (ref 3–12)
Neutro Abs: 10.3 10*3/uL — ABNORMAL HIGH (ref 1.7–7.7)
Neutrophils Relative %: 71 % (ref 43–77)
Platelets: 334 10*3/uL (ref 150–400)
RBC: 4.99 MIL/uL (ref 3.87–5.11)
RDW: 13 % (ref 11.5–15.5)
WBC: 14.5 10*3/uL — ABNORMAL HIGH (ref 4.0–10.5)

## 2014-06-03 LAB — URINE MICROSCOPIC-ADD ON

## 2014-06-03 LAB — LIPASE, BLOOD: Lipase: 35 U/L (ref 11–59)

## 2014-06-03 MED ORDER — FAMOTIDINE IN NACL 20-0.9 MG/50ML-% IV SOLN
20.0000 mg | Freq: Once | INTRAVENOUS | Status: AC
  Administered 2014-06-03: 20 mg via INTRAVENOUS
  Filled 2014-06-03: qty 50

## 2014-06-03 MED ORDER — IOHEXOL 300 MG/ML  SOLN
100.0000 mL | Freq: Once | INTRAMUSCULAR | Status: AC | PRN
  Administered 2014-06-03: 100 mL via INTRAVENOUS

## 2014-06-03 MED ORDER — HYDROMORPHONE HCL PF 1 MG/ML IJ SOLN
0.5000 mg | Freq: Once | INTRAMUSCULAR | Status: AC
  Administered 2014-06-03: 1 mg via INTRAVENOUS
  Filled 2014-06-03: qty 1

## 2014-06-03 MED ORDER — IOHEXOL 300 MG/ML  SOLN
50.0000 mL | INTRAMUSCULAR | Status: AC
  Administered 2014-06-03: 50 mL via ORAL

## 2014-06-03 MED ORDER — OXYCODONE-ACETAMINOPHEN 5-325 MG PO TABS: 1.0000 | ORAL_TABLET | ORAL | Status: DC | PRN

## 2014-06-03 MED ORDER — ONDANSETRON 8 MG PO TBDP: 8.0000 mg | ORAL_TABLET | Freq: Three times a day (TID) | ORAL | Status: DC | PRN

## 2014-06-03 MED ORDER — ONDANSETRON HCL 4 MG/2ML IJ SOLN
4.0000 mg | Freq: Once | INTRAMUSCULAR | Status: AC
  Administered 2014-06-03: 4 mg via INTRAVENOUS
  Filled 2014-06-03: qty 2

## 2014-06-03 MED ORDER — SODIUM CHLORIDE 0.9 % IV SOLN
INTRAVENOUS | Status: DC
  Administered 2014-06-03: 14:00:00 via INTRAVENOUS

## 2014-06-03 MED ORDER — RANITIDINE HCL 150 MG PO TABS: 150.0000 mg | ORAL_TABLET | Freq: Two times a day (BID) | ORAL | Status: DC

## 2014-06-03 NOTE — Discharge Instructions (Signed)

## 2014-06-03 NOTE — MAU Provider Note (Signed)
CSN: [id number]     Arrival date & time 06/03/14  1026 History   None    Chief Complaint  Patient presents with  . Emesis  . Abdominal Pain     (Consider location/radiation/quality/duration/timing/severity/associated sxs/prior Treatment) Patient is a 48 y.o. female presenting with abdominal pain. The history is provided by the patient.  Abdominal Pain The primary symptoms of the illness include abdominal pain, nausea and vomiting. The primary symptoms of the illness do not include shortness of breath, vaginal discharge or vaginal bleeding. Fever:  night sweats. The current episode started more than 2 days ago. The onset of the illness was gradual. The problem has been gradually worsening.  The patient states that she believes she is currently not pregnant. The patient has had a change in bowel habit. Additional symptoms associated with the illness include chills, anorexia and heartburn. Symptoms associated with the illness do not include urgency, frequency or back pain.   Laura Matthews is a 48 y.o. female who presents to the ED with abdominal pain. I had the pleasure of meeting this patient 04/22/2014 when she presented to the MAU with a mass in her left breast. She states that she has been for follow up and she does have breast cancer. She is awaiting a call for the date of the surgery. She states that the abdominal pain she has is in the epigastric area and is a burning feeling. She has vomited multiple times. The symptoms started after she ate fish at [facility]. She has been unable to keep anything down for the past 2 days.    Past Medical History  Diagnosis Date  . Mental disorder   . Depression   . Arthritis   . Hot flashes   . Head ache   . Malignant neoplasm of breast (female), unspecified site   . Family history of malignant neoplasm of breast    Past Surgical History  Procedure Laterality Date  . Knee surgery    . Foot surgery    . Tubal ligation     Family History   Problem Relation Age of Onset  . Breast cancer Mother 65    currently 34; TAH/BSO d/t ?cervical ca at 57  . Breast cancer Maternal Aunt     dx 16s; currently in her late 59s  . Lung cancer Father     deceased 35   History  Substance Use Topics  . Smoking status: Current Every Day Smoker -- 0.25 packs/day    Types: Cigarettes  . Smokeless tobacco: Never Used  . Alcohol Use: No     Comment: Percoset,Vicodin, Oxycotin   OB History   Grav Para Term Preterm Abortions TAB SAB Ect Mult Living   3 3 3       3      Review of Systems  Constitutional: Positive for chills. Fever:  night sweats.  HENT: Negative.   Eyes: Negative for visual disturbance.  Respiratory: Negative for cough, shortness of breath and wheezing.   Cardiovascular: Negative for chest pain.  Gastrointestinal: Positive for heartburn, nausea, vomiting, abdominal pain and anorexia.  Genitourinary: Negative for urgency, frequency, flank pain, vaginal bleeding and vaginal discharge.  Musculoskeletal: Negative for back pain and myalgias.  Skin: Negative for rash.  Neurological: Negative for light-headedness and headaches.  Psychiatric/Behavioral: Negative for confusion. The patient is not nervous/anxious.       Allergies  Norco  Home Medications   Prior to Admission medications   Medication Sig Start Date End Date  Taking? Authorizing Provider  ibuprofen (ADVIL,MOTRIN) 200 MG tablet Take 200 mg by mouth every 6 (six) hours as needed.   Yes Historical Provider, MD   BP 132/90  Pulse 82  Temp(Src) 98.2 F (36.8 C) (Oral)  Resp 18  Ht 5\' 10"  (1.778 m)  Wt 167 lb 3.2 oz (75.841 kg)  BMI 23.99 kg/m2  SpO2 100% Physical Exam  Nursing note and vitals reviewed. Constitutional: She is oriented to person, place, and time. She appears well-developed and well-nourished.  HENT:  Head: Normocephalic.  Eyes: EOM are normal.  Neck: Neck supple.  Cardiovascular: Normal rate.   Pulmonary/Chest: Effort normal.   Abdominal: Soft. Bowel sounds are normal. There is generalized tenderness. There is no rebound, no guarding and no CVA tenderness.  Increased pain epigastric area.   Musculoskeletal: Normal range of motion.  Neurological: She is alert and oriented to person, place, and time. No cranial nerve deficit.  Skin: Skin is warm and dry.  Psychiatric: She has a normal mood and affect. Her behavior is normal.    ED Course  Procedures (including critical care time) Results for orders placed during the hospital encounter of 06/03/14 (from the past 24 hour(s))  URINALYSIS, ROUTINE W REFLEX MICROSCOPIC     Status: Abnormal   Collection Time    06/03/14 10:50 AM      Result Value Ref Range   Color, Urine YELLOW  YELLOW   APPearance CLEAR  CLEAR   Specific Gravity, Urine >1.030 (*) 1.005 - 1.030   pH 6.0  5.0 - 8.0   Glucose, UA NEGATIVE  NEGATIVE mg/dL   Hgb urine dipstick SMALL (*) NEGATIVE   Bilirubin Urine SMALL (*) NEGATIVE   Ketones, ur NEGATIVE  NEGATIVE mg/dL   Protein, ur NEGATIVE  NEGATIVE mg/dL   Urobilinogen, UA 0.2  0.0 - 1.0 mg/dL   Nitrite NEGATIVE  NEGATIVE   Leukocytes, UA TRACE (*) NEGATIVE  URINE MICROSCOPIC-ADD ON     Status: Abnormal   Collection Time    06/03/14 10:50 AM      Result Value Ref Range   Squamous Epithelial / LPF FEW (*) RARE   WBC, UA 3-6  <3 WBC/hpf   RBC / HPF 0-2  <3 RBC/hpf   Bacteria, UA FEW (*) RARE   Urine-Other MUCOUS PRESENT    CBC WITH DIFFERENTIAL     Status: Abnormal   Collection Time    06/03/14  1:26 PM      Result Value Ref Range   WBC 14.5 (*) 4.0 - 10.5 K/uL   RBC 4.99  3.87 - 5.11 MIL/uL   Hemoglobin 15.1 (*) 12.0 - 15.0 g/dL   HCT 44.1  36.0 - 46.0 %   MCV 88.4  78.0 - 100.0 fL   MCH 30.3  26.0 - 34.0 pg   MCHC 34.2  30.0 - 36.0 g/dL   RDW 13.0  11.5 - 15.5 %   Platelets 334  150 - 400 K/uL   Neutrophils Relative % 71  43 - 77 %   Neutro Abs 10.3 (*) 1.7 - 7.7 K/uL   Lymphocytes Relative 21  12 - 46 %   Lymphs Abs 3.0  0.7 -  4.0 K/uL   Monocytes Relative 8  3 - 12 %   Monocytes Absolute 1.1 (*) 0.1 - 1.0 K/uL   Eosinophils Relative 0  0 - 5 %   Eosinophils Absolute 0.1  0.0 - 0.7 K/uL   Basophils Relative 0  0 - 1 %  Basophils Absolute 0.0  0.0 - 0.1 K/uL  COMPREHENSIVE METABOLIC PANEL     Status: Abnormal   Collection Time    06/03/14  1:26 PM      Result Value Ref Range   Sodium 138  137 - 147 mEq/L   Potassium 3.5 (*) 3.7 - 5.3 mEq/L   Chloride 95 (*) 96 - 112 mEq/L   CO2 30  19 - 32 mEq/L   Glucose, Bld 114 (*) 70 - 99 mg/dL   BUN 14  6 - 23 mg/dL   Creatinine, Ser 0.63  0.50 - 1.10 mg/dL   Calcium 10.2  8.4 - 10.5 mg/dL   Total Protein 8.6 (*) 6.0 - 8.3 g/dL   Albumin 4.7  3.5 - 5.2 g/dL   AST 21  0 - 37 U/L   ALT 21  0 - 35 U/L   Alkaline Phosphatase 75  39 - 117 U/L   Total Bilirubin 0.5  0.3 - 1.2 mg/dL   GFR calc non Af Amer >90  >90 mL/min   GFR calc Af Amer >90  >90 mL/min   Anion gap 13  5 - 15  LIPASE, BLOOD     Status: None   Collection Time    06/03/14  1:26 PM      Result Value Ref Range   Lipase 35  11 - 59 U/L    Imaging Review [name] Wo Contrast  06/02/2014   CLINICAL DATA:  Newly diagnosed breast cancer, staging.  EXAM: MRI HEAD WITHOUT AND WITH CONTRAST  TECHNIQUE: Multiplanar, multiecho pulse sequences of the brain and surrounding structures were obtained without and with intravenous contrast.  CONTRAST:  MultiHance 15 mL.  COMPARISON:  CT scan 05/29/2014.  FINDINGS: The patient was unable to remain motionless for the exam. Small or subtle lesions could be overlooked.  No evidence for acute infarction, hemorrhage, mass lesion, hydrocephalus, or extra-axial fluid. Normal cerebral volume. No white matter disease. Post infusion, no abnormal enhancement of the brain or meninges. Flow voids are maintained throughout the carotid, basilar, and vertebral arteries. There are no areas of chronic hemorrhage. Pituitary, pineal, and cerebellar tonsils unremarkable. No upper cervical  lesions. Visualized calvarium, skull base, and upper cervical osseous structures unremarkable. Scalp and extracranial soft tissues, orbits, sinuses, and mastoids show no acute process.  IMPRESSION: Motion degraded examination.  No definite intracranial metastases.   Electronically Signed   By: Rolla Flatten M.D.   On: 06/02/2014 19:56   Ct Abdomen Pelvis W Contrast  06/03/2014   CLINICAL DATA:  Nausea and vomiting for 3 days, breast cancer.  EXAM: CT ABDOMEN AND PELVIS WITH CONTRAST  TECHNIQUE: Multidetector CT imaging of the abdomen and pelvis was performed using the standard protocol following bolus administration of intravenous contrast.  CONTRAST:  165mL OMNIPAQUE IOHEXOL 300 MG/ML  SOLN  COMPARISON:  PET-CT May 29, 2014  FINDINGS: LUNG BASES: Included view of the lung bases are clear. Visualized heart and pericardium are unremarkable.  SOLID ORGANS: The spleen, pancreas and right adrenal gland are unremarkable. Diffusely hypodense liver consistent with fatty infiltration, which is otherwise unremarkable. 10 mm left adrenal nodule, which measured 9 Hounsfield units on prior nonenhanced PET-CT consistent with benign adenoma. Gallbladder a shading without CT findings of acute cholecystitis.  GASTROINTESTINAL TRACT: The stomach, small and large bowel are normal in course and caliber without inflammatory changes. Normal appendix.  KIDNEYS/ URINARY TRACT: Kidneys are orthotopic, demonstrating symmetric enhancement. No nephrolithiasis, hydronephrosis or solid renal masses. The  unopacified ureters are normal in course and caliber. Delayed imaging through the kidneys demonstrates symmetric prompt contrast excretion within the proximal urinary collecting system. Urinary bladder is partially distended and unremarkable.  PERITONEUM/RETROPERITONEUM: No intraperitoneal free fluid nor free air. Aortoiliac vessels are normal in course and caliber, mild calcific atherosclerosis. No lymphadenopathy by CT size criteria.  Internal reproductive organs are unremarkable.  SOFT TISSUE/OSSEOUS STRUCTURES: Nodule left breast with biopsy clip, corresponding to PET-CT abnormality.  IMPRESSION: No acute intra-abdominal or pelvic process.  Hepatic steatosis. Possible gallbladder sludge without CT findings of acute cholecystitis.  Left breast nodule corresponding to a PET-CT abnormality.   Electronically Signed   By: Elon Alas   On: 06/03/2014 15:30     MDM  48 y.o. female with nausea and vomiting x 3 days. Feeling much better after IV hydration, phenergan and pain management. She will follow up as scheduled with the [facility]. Stable for discharge without n/v at this time. She will stay on a low fat diet. Discussed CT results and need for follow up.  Discussed with the patient and all questioned fully answered. She will return if any problems arise.    Medication List    STOP taking these medications       ibuprofen 200 MG tablet  Commonly known as:  ADVIL,MOTRIN      TAKE these medications       ondansetron 8 MG disintegrating tablet  Commonly known as:  ZOFRAN ODT  Take 1 tablet (8 mg total) by mouth every 8 (eight) hours as needed for nausea or vomiting.     oxyCODONE-acetaminophen 5-325 MG per tablet  Commonly known as:  ROXICET  Take 1 tablet by mouth every 4 (four) hours as needed for severe pain.     ranitidine 150 MG tablet  Commonly known as:  ZANTAC  Take 1 tablet (150 mg total) by mouth 2 (two) times daily.

## 2014-06-03 NOTE — MAU Note (Signed)
Dx'd with breast CA, waiting to have to surgery.  Ate @ [facility] on Saturday - started vomiting Saturday night, still vomiting now, has abd pain.  Denies diarrhea.

## 2014-06-05 ENCOUNTER — Encounter (HOSPITAL_BASED_OUTPATIENT_CLINIC_OR_DEPARTMENT_OTHER): Payer: Self-pay | Admitting: *Deleted

## 2014-06-05 NOTE — Progress Notes (Signed)
Pt has chronic pain-hx opiate abuse-says not now-is taking percocets labd done 06/03/14-pet scan 7/15 To bring all meds and overnight bag in case she has to stay

## 2014-06-10 ENCOUNTER — Encounter (HOSPITAL_BASED_OUTPATIENT_CLINIC_OR_DEPARTMENT_OTHER): Payer: Self-pay | Admitting: *Deleted

## 2014-06-11 ENCOUNTER — Encounter (HOSPITAL_BASED_OUTPATIENT_CLINIC_OR_DEPARTMENT_OTHER)
Admission: RE | Disposition: A | Discharge status: Home / Self Care | Payer: Self-pay | Source: Ambulatory Visit | Attending: General Surgery

## 2014-06-11 ENCOUNTER — Ambulatory Visit (HOSPITAL_BASED_OUTPATIENT_CLINIC_OR_DEPARTMENT_OTHER)
Admission: RE | Admit: 2014-06-11 | Discharge: 2014-06-12 | Disposition: A | Discharge status: Home / Self Care | Payer: Medicaid Other | Source: Ambulatory Visit | Attending: General Surgery | Admitting: General Surgery

## 2014-06-11 ENCOUNTER — Encounter (HOSPITAL_BASED_OUTPATIENT_CLINIC_OR_DEPARTMENT_OTHER): Payer: Self-pay | Admitting: *Deleted

## 2014-06-11 ENCOUNTER — Encounter (HOSPITAL_BASED_OUTPATIENT_CLINIC_OR_DEPARTMENT_OTHER): Payer: Medicaid Other | Admitting: Certified Registered"

## 2014-06-11 ENCOUNTER — Encounter (HOSPITAL_COMMUNITY)
Admission: RE | Admit: 2014-06-11 | Discharge: 2014-06-11 | Disposition: A | Discharge status: Home / Self Care | Payer: Medicaid Other | Source: Ambulatory Visit | Attending: General Surgery | Admitting: General Surgery

## 2014-06-11 ENCOUNTER — Ambulatory Visit (HOSPITAL_BASED_OUTPATIENT_CLINIC_OR_DEPARTMENT_OTHER): Payer: Medicaid Other | Admitting: Certified Registered"

## 2014-06-11 DIAGNOSIS — K219 Gastro-esophageal reflux disease without esophagitis: Secondary | ICD-10-CM | POA: Diagnosis not present

## 2014-06-11 DIAGNOSIS — D0590 Unspecified type of carcinoma in situ of unspecified breast: Secondary | ICD-10-CM | POA: Insufficient documentation

## 2014-06-11 DIAGNOSIS — F329 Major depressive disorder, single episode, unspecified: Secondary | ICD-10-CM | POA: Diagnosis not present

## 2014-06-11 DIAGNOSIS — F17200 Nicotine dependence, unspecified, uncomplicated: Secondary | ICD-10-CM | POA: Diagnosis not present

## 2014-06-11 DIAGNOSIS — C773 Secondary and unspecified malignant neoplasm of axilla and upper limb lymph nodes: Secondary | ICD-10-CM | POA: Diagnosis not present

## 2014-06-11 DIAGNOSIS — Z885 Allergy status to narcotic agent status: Secondary | ICD-10-CM | POA: Insufficient documentation

## 2014-06-11 DIAGNOSIS — F3289 Other specified depressive episodes: Secondary | ICD-10-CM | POA: Diagnosis not present

## 2014-06-11 DIAGNOSIS — C50919 Malignant neoplasm of unspecified site of unspecified female breast: Secondary | ICD-10-CM | POA: Diagnosis present

## 2014-06-11 DIAGNOSIS — C50419 Malignant neoplasm of upper-outer quadrant of unspecified female breast: Secondary | ICD-10-CM | POA: Diagnosis present

## 2014-06-11 DIAGNOSIS — C50412 Malignant neoplasm of upper-outer quadrant of left female breast: Secondary | ICD-10-CM

## 2014-06-11 HISTORY — PX: BREAST LUMPECTOMY WITH SENTINEL LYMPH NODE BIOPSY: SHX5597

## 2014-06-11 SURGERY — BREAST LUMPECTOMY WITH SENTINEL LYMPH NODE BX
Anesthesia: General | Site: Breast | Laterality: Left

## 2014-06-11 MED ORDER — MIDAZOLAM HCL 5 MG/5ML IJ SOLN
INTRAMUSCULAR | Status: DC | PRN
  Administered 2014-06-11: 2 mg via INTRAVENOUS

## 2014-06-11 MED ORDER — HYDROMORPHONE HCL PF 1 MG/ML IJ SOLN
0.2500 mg | INTRAMUSCULAR | Status: DC | PRN
  Administered 2014-06-11: 0.5 mg via INTRAVENOUS

## 2014-06-11 MED ORDER — ONDANSETRON HCL 4 MG/2ML IJ SOLN: 4.0000 mg | Freq: Four times a day (QID) | INTRAMUSCULAR | Status: DC | PRN

## 2014-06-11 MED ORDER — SODIUM CHLORIDE 0.9 % IJ SOLN: 3.0000 mL | Freq: Two times a day (BID) | INTRAMUSCULAR | Status: DC

## 2014-06-11 MED ORDER — FAMOTIDINE 20 MG PO TABS: 20.0000 mg | ORAL_TABLET | Freq: Every day | ORAL | Status: DC

## 2014-06-11 MED ORDER — ACETAMINOPHEN 325 MG PO TABS: 650.0000 mg | ORAL_TABLET | ORAL | Status: DC | PRN

## 2014-06-11 MED ORDER — DIPHENHYDRAMINE HCL 25 MG PO CAPS: 25.0000 mg | ORAL_CAPSULE | Freq: Four times a day (QID) | ORAL | Status: DC | PRN

## 2014-06-11 MED ORDER — LIDOCAINE-EPINEPHRINE (PF) 1 %-1:200000 IJ SOLN
INTRAMUSCULAR | Status: DC | PRN
  Administered 2014-06-11: 17:00:00 via SUBCUTANEOUS

## 2014-06-11 MED ORDER — PROMETHAZINE HCL 25 MG/ML IJ SOLN
12.5000 mg | Freq: Four times a day (QID) | INTRAMUSCULAR | Status: DC | PRN
  Filled 2014-06-11 (×2): qty 1

## 2014-06-11 MED ORDER — OXYCODONE-ACETAMINOPHEN 5-325 MG PO TABS
1.0000 | ORAL_TABLET | ORAL | Status: DC | PRN
  Administered 2014-06-11: 1 via ORAL
  Administered 2014-06-11 – 2014-06-12 (×2): 2 via ORAL
  Filled 2014-06-11 (×3): qty 2

## 2014-06-11 MED ORDER — OXYCODONE HCL 5 MG PO TABS: 5.0000 mg | ORAL_TABLET | ORAL | Status: DC | PRN

## 2014-06-11 MED ORDER — FENTANYL CITRATE 0.05 MG/ML IJ SOLN
50.0000 ug | INTRAMUSCULAR | Status: DC | PRN
  Administered 2014-06-11 (×2): 100 ug via INTRAVENOUS

## 2014-06-11 MED ORDER — OXYCODONE HCL 5 MG PO TABS: 5.0000 mg | ORAL_TABLET | Freq: Once | ORAL | Status: AC | PRN

## 2014-06-11 MED ORDER — MIDAZOLAM HCL 2 MG/2ML IJ SOLN
INTRAMUSCULAR | Status: AC
  Filled 2014-06-11: qty 2

## 2014-06-11 MED ORDER — LACTATED RINGERS IV SOLN
INTRAVENOUS | Status: DC
  Administered 2014-06-11 (×2): via INTRAVENOUS

## 2014-06-11 MED ORDER — LIDOCAINE HCL (CARDIAC) 20 MG/ML IV SOLN
INTRAVENOUS | Status: DC | PRN
  Administered 2014-06-11: 60 mg via INTRAVENOUS

## 2014-06-11 MED ORDER — SODIUM CHLORIDE 0.9 % IV SOLN: 250.0000 mL | INTRAVENOUS | Status: DC | PRN

## 2014-06-11 MED ORDER — OXYCODONE HCL 5 MG/5ML PO SOLN: 5.0000 mg | Freq: Once | ORAL | Status: AC | PRN

## 2014-06-11 MED ORDER — ONDANSETRON HCL 4 MG PO TABS: 4.0000 mg | ORAL_TABLET | Freq: Four times a day (QID) | ORAL | Status: DC | PRN

## 2014-06-11 MED ORDER — OXYCODONE-ACETAMINOPHEN 5-325 MG PO TABS: 1.0000 | ORAL_TABLET | ORAL | Status: DC | PRN

## 2014-06-11 MED ORDER — BUPIVACAINE HCL (PF) 0.5 % IJ SOLN
INTRAMUSCULAR | Status: DC | PRN
  Administered 2014-06-11: 30 mL via PERINEURAL

## 2014-06-11 MED ORDER — ACETAMINOPHEN 325 MG PO TABS: 325.0000 mg | ORAL_TABLET | ORAL | Status: DC | PRN

## 2014-06-11 MED ORDER — SODIUM CHLORIDE 0.9 % IJ SOLN
INTRAMUSCULAR | Status: DC | PRN
  Administered 2014-06-11: 15:00:00 via INTRAMUSCULAR

## 2014-06-11 MED ORDER — SODIUM CHLORIDE 0.9 % IJ SOLN: 3.0000 mL | INTRAMUSCULAR | Status: DC | PRN

## 2014-06-11 MED ORDER — KCL IN DEXTROSE-NACL 20-5-0.45 MEQ/L-%-% IV SOLN
INTRAVENOUS | Status: DC
  Administered 2014-06-11: 18:00:00 via INTRAVENOUS
  Filled 2014-06-11: qty 1000

## 2014-06-11 MED ORDER — PROPOFOL 10 MG/ML IV BOLUS
INTRAVENOUS | Status: DC | PRN
  Administered 2014-06-11: 200 mg via INTRAVENOUS

## 2014-06-11 MED ORDER — KETOROLAC TROMETHAMINE 15 MG/ML IJ SOLN
15.0000 mg | Freq: Four times a day (QID) | INTRAMUSCULAR | Status: DC
  Administered 2014-06-11 – 2014-06-12 (×2): 15 mg via INTRAVENOUS
  Filled 2014-06-11 (×2): qty 1

## 2014-06-11 MED ORDER — LIDOCAINE-EPINEPHRINE (PF) 1 %-1:200000 IJ SOLN
INTRAMUSCULAR | Status: AC
  Filled 2014-06-11: qty 10

## 2014-06-11 MED ORDER — CEFAZOLIN SODIUM 1-5 GM-% IV SOLN
INTRAVENOUS | Status: AC
  Filled 2014-06-11: qty 50

## 2014-06-11 MED ORDER — FENTANYL CITRATE 0.05 MG/ML IJ SOLN
INTRAMUSCULAR | Status: AC
  Filled 2014-06-11: qty 2

## 2014-06-11 MED ORDER — FENTANYL CITRATE 0.05 MG/ML IJ SOLN
INTRAMUSCULAR | Status: DC | PRN
Start: 2014-06-11 — End: 2014-06-11
  Administered 2014-06-11: 50 ug via INTRAVENOUS
  Administered 2014-06-11 (×4): 25 ug via INTRAVENOUS
  Administered 2014-06-11: 50 ug via INTRAVENOUS

## 2014-06-11 MED ORDER — TECHNETIUM TC 99M SULFUR COLLOID FILTERED
1.0000 | Freq: Once | INTRAVENOUS | Status: AC | PRN
  Administered 2014-06-11: 1 via INTRADERMAL

## 2014-06-11 MED ORDER — MIDAZOLAM HCL 2 MG/2ML IJ SOLN
1.0000 mg | INTRAMUSCULAR | Status: DC | PRN
  Administered 2014-06-11 (×2): 2 mg via INTRAVENOUS

## 2014-06-11 MED ORDER — CEFAZOLIN SODIUM-DEXTROSE 2-3 GM-% IV SOLR
INTRAVENOUS | Status: AC
  Filled 2014-06-11: qty 50

## 2014-06-11 MED ORDER — HYDROMORPHONE HCL PF 1 MG/ML IJ SOLN
INTRAMUSCULAR | Status: AC
  Filled 2014-06-11: qty 1

## 2014-06-11 MED ORDER — DEXAMETHASONE SODIUM PHOSPHATE 4 MG/ML IJ SOLN
INTRAMUSCULAR | Status: DC | PRN
  Administered 2014-06-11: 10 mg via INTRAVENOUS

## 2014-06-11 MED ORDER — FENTANYL CITRATE 0.05 MG/ML IJ SOLN
INTRAMUSCULAR | Status: AC
  Filled 2014-06-11: qty 6

## 2014-06-11 MED ORDER — CEFAZOLIN SODIUM-DEXTROSE 2-3 GM-% IV SOLR
2.0000 g | INTRAVENOUS | Status: AC
Start: 2014-06-12 — End: 2014-06-11
  Administered 2014-06-11: 2 g via INTRAVENOUS

## 2014-06-11 MED ORDER — ACETAMINOPHEN 650 MG RE SUPP: 650.0000 mg | RECTAL | Status: DC | PRN

## 2014-06-11 MED ORDER — ACETAMINOPHEN 160 MG/5ML PO SOLN: 325.0000 mg | ORAL | Status: DC | PRN

## 2014-06-11 MED ORDER — ONDANSETRON 8 MG PO TBDP: 8.0000 mg | ORAL_TABLET | Freq: Three times a day (TID) | ORAL | Status: DC | PRN

## 2014-06-11 MED ORDER — MORPHINE SULFATE 2 MG/ML IJ SOLN: 1.0000 mg | INTRAMUSCULAR | Status: DC | PRN

## 2014-06-11 MED ORDER — CEFAZOLIN SODIUM 1-5 GM-% IV SOLN
1.0000 g | Freq: Four times a day (QID) | INTRAVENOUS | Status: DC
  Administered 2014-06-11 – 2014-06-12 (×2): 1 g via INTRAVENOUS

## 2014-06-11 SURGICAL SUPPLY — 64 items
BINDER BREAST LRG (GAUZE/BANDAGES/DRESSINGS) IMPLANT
BINDER BREAST MEDIUM (GAUZE/BANDAGES/DRESSINGS) IMPLANT
BINDER BREAST XLRG (GAUZE/BANDAGES/DRESSINGS) ×1 IMPLANT
BINDER BREAST XXLRG (GAUZE/BANDAGES/DRESSINGS) IMPLANT
BLADE HEX COATED 2.75 (ELECTRODE) ×2 IMPLANT
BLADE SURG 10 STRL SS (BLADE) ×2 IMPLANT
BLADE SURG 15 STRL LF DISP TIS (BLADE) ×1 IMPLANT
BLADE SURG 15 STRL SS (BLADE) ×2
BNDG GAUZE ELAST 4 BULKY (GAUZE/BANDAGES/DRESSINGS) ×2 IMPLANT
CANISTER SUCT 1200ML W/VALVE (MISCELLANEOUS) ×2 IMPLANT
CHLORAPREP W/TINT 26ML (MISCELLANEOUS) ×2 IMPLANT
CLIP TI LARGE 6 (CLIP) ×1 IMPLANT
CLIP TI MEDIUM 6 (CLIP) ×10 IMPLANT
CLIP TI WIDE RED SMALL 6 (CLIP) ×1 IMPLANT
COVER MAYO STAND STRL (DRAPES) ×2 IMPLANT
COVER PROBE W GEL 5X96 (DRAPES) ×2 IMPLANT
DECANTER SPIKE VIAL GLASS SM (MISCELLANEOUS) IMPLANT
DEVICE DUBIN W/COMP PLATE 8390 (MISCELLANEOUS) ×1 IMPLANT
DRAIN CHANNEL 19F RND (DRAIN) ×1 IMPLANT
DRAPE UTILITY XL STRL (DRAPES) ×2 IMPLANT
DRSG PAD ABDOMINAL 8X10 ST (GAUZE/BANDAGES/DRESSINGS) IMPLANT
DRSG TEGADERM 4X4.75 (GAUZE/BANDAGES/DRESSINGS) IMPLANT
ELECT REM PT RETURN 9FT ADLT (ELECTROSURGICAL) ×2
ELECTRODE REM PT RTRN 9FT ADLT (ELECTROSURGICAL) ×1 IMPLANT
EVACUATOR SILICONE 100CC (DRAIN) ×1 IMPLANT
GAUZE SPONGE 4X4 12PLY STRL (GAUZE/BANDAGES/DRESSINGS) IMPLANT
GLOVE BIO SURGEON STRL SZ 6 (GLOVE) ×2 IMPLANT
GLOVE BIOGEL PI IND STRL 6.5 (GLOVE) ×1 IMPLANT
GLOVE BIOGEL PI IND STRL 7.0 (GLOVE) IMPLANT
GLOVE BIOGEL PI INDICATOR 6.5 (GLOVE) ×1
GLOVE BIOGEL PI INDICATOR 7.0 (GLOVE) ×2
GLOVE ECLIPSE 6.5 STRL STRAW (GLOVE) ×2 IMPLANT
GOWN STRL REUS W/ TWL LRG LVL3 (GOWN DISPOSABLE) ×1 IMPLANT
GOWN STRL REUS W/TWL 2XL LVL3 (GOWN DISPOSABLE) ×2 IMPLANT
GOWN STRL REUS W/TWL LRG LVL3 (GOWN DISPOSABLE) ×4
KIT MARKER MARGIN INK (KITS) ×1 IMPLANT
NDL HYPO 25X1 1.5 SAFETY (NEEDLE) ×2 IMPLANT
NDL SAFETY ECLIPSE 18X1.5 (NEEDLE) ×1 IMPLANT
NEEDLE HYPO 18GX1.5 SHARP (NEEDLE) ×2
NEEDLE HYPO 25X1 1.5 SAFETY (NEEDLE) ×4 IMPLANT
NS IRRIG 1000ML POUR BTL (IV SOLUTION) ×1 IMPLANT
PACK BASIN DAY SURGERY FS (CUSTOM PROCEDURE TRAY) ×2 IMPLANT
PACK UNIVERSAL I (CUSTOM PROCEDURE TRAY) ×2 IMPLANT
PENCIL BUTTON HOLSTER BLD 10FT (ELECTRODE) ×2 IMPLANT
PIN SAFETY STERILE (MISCELLANEOUS) IMPLANT
SLEEVE SCD COMPRESS KNEE MED (MISCELLANEOUS) ×1 IMPLANT
SPONGE LAP 18X18 X RAY DECT (DISPOSABLE) ×3 IMPLANT
SPONGE LAP 4X18 X RAY DECT (DISPOSABLE) IMPLANT
STAPLER VISISTAT 35W (STAPLE) IMPLANT
STOCKINETTE IMPERVIOUS LG (DRAPES) ×2 IMPLANT
STRIP CLOSURE SKIN 1/2X4 (GAUZE/BANDAGES/DRESSINGS) ×2 IMPLANT
SUT ETHILON 2 0 FS 18 (SUTURE) ×1 IMPLANT
SUT MON AB 4-0 PC3 18 (SUTURE) ×2 IMPLANT
SUT SILK 2 0 SH (SUTURE) IMPLANT
SUT VIC AB 2-0 SH 27 (SUTURE)
SUT VIC AB 2-0 SH 27XBRD (SUTURE) IMPLANT
SUT VIC AB 3-0 SH 27 (SUTURE) ×4
SUT VIC AB 3-0 SH 27X BRD (SUTURE) ×2 IMPLANT
SYR BULB 3OZ (MISCELLANEOUS) IMPLANT
SYR CONTROL 10ML LL (SYRINGE) ×4 IMPLANT
TOWEL OR 17X24 6PK STRL BLUE (TOWEL DISPOSABLE) ×2 IMPLANT
TOWEL OR NON WOVEN STRL DISP B (DISPOSABLE) ×2 IMPLANT
TUBE CONNECTING 20X1/4 (TUBING) ×2 IMPLANT
YANKAUER SUCT BULB TIP NO VENT (SUCTIONS) ×2 IMPLANT

## 2014-06-11 NOTE — H&P (View-Only) (Signed)
Chief complaint:  New left breast cancer  HISTORY: Patient is a 48 year old female who presented with a palpable left breast mass. She is referred by Dr. Evangeline Dakin for consultation for this new breast cancer.  She noted this around 2-3 months ago. She did go to the emergency department several weeks ago because of breast pain. She's also had 2 weeks of nausea, vomiting, and headache.  She underwent imaging which demonstrated a 1.8 x 1.5 cm mass at 3:00 in her left breast. She also had some abnormal appearing lymph nodes in the left axilla. These were recommended to be biopsied, but she did not want to undergo this. She had MRI with similar findings. 2 no other lesions noted. Her pathology was positive for invasive mammary carcinoma this was ER and PR positive and HER-2 negative. Ki-67 was 80%.  She does have a family history of her mother having breast cancer at age 64. She is not sure if her mother also had ovarian cancer. Her father had lung cancer. She has no personal history of cancer. She had menarche at age 59. She has not used hormone replacement for hormonal contraception. She is postmenopausal and had 3 children with the first of 62. She has had a Pap smear 2 weeks ago. She got her first mammogram in 2001.  Past Medical History  Diagnosis Date  . Mental disorder   . Depression   . Arthritis   . Hot flashes   . Head ache     Past Surgical History  Procedure Laterality Date  . Knee surgery    . Foot surgery      Current Outpatient Prescriptions  Medication Sig Dispense Refill  . HYDROcodone-acetaminophen (NORCO) 5-325 MG per tablet Take 1 tablet by mouth every 6 (six) hours as needed for moderate pain.  15 tablet  0  . naproxen sodium (ANAPROX) 220 MG tablet Take 440 mg by mouth 3 (three) times daily as needed (For pain.).      Marland Kitchen promethazine (PHENERGAN) 25 MG suppository Place 1 suppository (25 mg total) rectally once.  1 each  0   No current facility-administered  medications for this visit.     Allergies  Allergen Reactions  . Norco [Hydrocodone-Acetaminophen] Itching and Nausea Only    Pt reported allergy.     Family History  Problem Relation Age of Onset  . Breast cancer Mother   . Cancer Mother     uterine  . Breast cancer Maternal Aunt   . Lung cancer Father      History   Social History  . Marital Status: Single    Spouse Name: N/A    Number of Children: N/A  . Years of Education: N/A   Social History Main Topics  . Smoking status: Current Every Day Smoker -- 1.00 packs/day    Types: Cigarettes  . Smokeless tobacco: Never Used  . Alcohol Use: No     Comment: Percoset,Vicodin, Oxycotin  . Drug Use: No  . Sexual Activity: No   Other Topics Concern  . Not on file   Social History Narrative  . No narrative on file   Pt has been in alcohol rehab twice.    REVIEW OF SYSTEMS - PERTINENT POSITIVES ONLY: 12 point review of systems negative other than HPI and PMH except for chills, night sweats, sleep difficulty, breast pain, sleeps on 2 pillows, nausea/vomiting, joint pain, headaches, headaches, hot flashes.    EXAM: Wt Readings from Last 3 Encounters:  05/21/14 173 lb  9.6 oz (78.744 kg)  05/06/14 178 lb 12.8 oz (81.103 kg)  04/22/14 181 lb (82.101 kg)   Temp Readings from Last 3 Encounters:  05/21/14 98.2 F (36.8 C) Oral  05/06/14 98.7 F (37.1 C) Oral  04/22/14 99.1 F (37.3 C) Oral   BP Readings from Last 3 Encounters:  05/21/14 154/82  05/06/14 118/72  04/22/14 160/83   Pulse Readings from Last 3 Encounters:  05/21/14 72  04/22/14 96  01/24/14 86     Wt Readings from Last 3 Encounters:  05/21/14 173 lb 9.6 oz (78.744 kg)  05/06/14 178 lb 12.8 oz (81.103 kg)  04/22/14 181 lb (82.101 kg)     Gen:  No acute distress.  Well nourished and well groomed.   Neurological: Alert and oriented to person, place, and time. Coordination normal.  Head: Normocephalic and atraumatic.  Eyes: Conjunctivae are  normal. Pupils are equal, round, and reactive to light. No scleral icterus.  Neck: Normal range of motion. Neck supple. No tracheal deviation or thyromegaly present.  Cardiovascular: Normal rate, regular rhythm, normal heart sounds and intact distal pulses.  Exam reveals no gallop and no friction rub.  No murmur heard. Breast: palpable 2-3 cm mass at 2-3 o'clock on left.  No palpable lymphadenopathy.  Both breasts sore. Bruising near biopsy site.  No other palpable masses in either breast.  Normal ptotic configuration bilaterally.  No nipple retraction or discharge.  No skin dimpling.  Respiratory: Effort normal.  No respiratory distress. No chest wall tenderness. Breath sounds normal.  No wheezes, rales or rhonchi.  GI: Soft. Bowel sounds are normal. The abdomen is soft and nontender.  There is no rebound and no guarding.  Musculoskeletal: Normal range of motion. Extremities are nontender.  Lymphadenopathy: No cervical, preauricular, postauricular or axillary adenopathy is present Skin: Skin is warm and dry. No rash noted. No diaphoresis. No erythema. No pallor. No clubbing, cyanosis, or edema.   Psychiatric: Normal mood and affect. Behavior is anxious.  Judgment and thought content normal.    LABORATORY RESULTS: Available labs are reviewed   Recent Results (from the past 2160 hour(s))  URINALYSIS, ROUTINE W REFLEX MICROSCOPIC     Status: Abnormal   Collection Time    04/22/14  4:16 PM      Result Value Ref Range   Color, Urine YELLOW  YELLOW   APPearance HAZY (*) CLEAR   Specific Gravity, Urine 1.025  1.005 - 1.030   pH 6.0  5.0 - 8.0   Glucose, UA NEGATIVE  NEGATIVE mg/dL   Hgb urine dipstick NEGATIVE  NEGATIVE   Bilirubin Urine NEGATIVE  NEGATIVE   Ketones, ur NEGATIVE  NEGATIVE mg/dL   Protein, ur NEGATIVE  NEGATIVE mg/dL   Urobilinogen, UA 0.2  0.0 - 1.0 mg/dL   Nitrite NEGATIVE  NEGATIVE   Leukocytes, UA NEGATIVE  NEGATIVE   Comment: MICROSCOPIC NOT DONE ON URINES WITH NEGATIVE  PROTEIN, BLOOD, LEUKOCYTES, NITRITE, OR GLUCOSE <1000 mg/dL.  POCT PREGNANCY, URINE     Status: None   Collection Time    04/22/14  4:24 PM      Result Value Ref Range   Preg Test, Ur NEGATIVE  NEGATIVE   Comment:            THE SENSITIVITY OF THIS     METHODOLOGY IS >24 mIU/mL  CYTOLOGY - PAP     Status: None   Collection Time    05/06/14 12:00 AM      Result Value Ref  Range   CYTOLOGY - PAP PAP RESULT    CBC WITH DIFFERENTIAL     Status: Abnormal   Collection Time    05/21/14  8:37 AM      Result Value Ref Range   WBC 10.0  3.9 - 10.3 10e3/uL   NEUT# 6.8 (*) 1.5 - 6.5 10e3/uL   HGB 12.9  11.6 - 15.9 g/dL   HCT 39.4  34.8 - 46.6 %   Platelets 359  145 - 400 10e3/uL   MCV 90.8  79.5 - 101.0 fL   MCH 29.6  25.1 - 34.0 pg   MCHC 32.6  31.5 - 36.0 g/dL   RBC 4.35  3.70 - 5.45 10e6/uL   RDW 13.3  11.2 - 14.5 %   lymph# 2.3  0.9 - 3.3 10e3/uL   MONO# 0.5  0.1 - 0.9 10e3/uL   Eosinophils Absolute 0.3  0.0 - 0.5 10e3/uL   Basophils Absolute 0.1  0.0 - 0.1 10e3/uL   NEUT% 68.5  38.4 - 76.8 %   LYMPH% 23.0  14.0 - 49.7 %   MONO% 4.7  0.0 - 14.0 %   EOS% 2.9  0.0 - 7.0 %   BASO% 0.9  0.0 - 2.0 %  COMPREHENSIVE METABOLIC PANEL (HC62)     Status: Abnormal   Collection Time    05/21/14  8:37 AM      Result Value Ref Range   Sodium 138  136 - 145 mEq/L   Potassium 4.0  3.5 - 5.1 mEq/L   Chloride 104  98 - 109 mEq/L   CO2 24  22 - 29 mEq/L   Glucose 214 (*) 70 - 140 mg/dl   BUN 8.1  7.0 - 26.0 mg/dL   Creatinine 0.7  0.6 - 1.1 mg/dL   Total Bilirubin 0.34  0.20 - 1.20 mg/dL   Alkaline Phosphatase 57  40 - 150 U/L   AST 19  5 - 34 U/L   ALT 23  0 - 55 U/L   Total Protein 7.3  6.4 - 8.3 g/dL   Albumin 4.0  3.5 - 5.0 g/dL   Calcium 9.1  8.4 - 10.4 mg/dL   Anion Gap 11  3 - 11 mEq/L     RADIOLOGY RESULTS: See E-Chart or I-Site for most recent results.  Images and reports are reviewed.  Mr Breast Bilateral W Wo Contrast  05/16/2014   CLINICAL DATA:  48 year old female  recently diagnosed with invasive mammary carcinoma in the 3 o'clock region of the left breast. During her diagnostic workup abnormal left axillary adenopathy was imaged. The patient declined biopsy of the axillary adenopathy.  LABS:  None.  EXAM: BILATERAL BREAST MRI WITH AND WITHOUT CONTRAST  TECHNIQUE: Multiplanar, multisequence MR images of both breasts were obtained prior to and following the intravenous administration of 13m of MultiHance.  THREE-DIMENSIONAL MR IMAGE RENDERING ON INDEPENDENT WORKSTATION:  Three-dimensional MR images were rendered by post-processing of the original MR data on an independent workstation. The three-dimensional MR images were interpreted, and findings are reported in the following complete MRI report for this study. Three dimensional images were evaluated at the independent DynaCad workstation  COMPARISON:  Mammograms dated 05/05/2014 and 05/09/2014.  FINDINGS: Breast composition: b.  Scattered fibroglandular tissue.  Background parenchymal enhancement: Moderate  Right breast: No mass or abnormal enhancement.  Left breast: In the posterior third of the 3 o'clock region of the left breast there is an irregular enhancing mass measuring 1.7 x 1.7 x 1.6  cm. Along the medial aspect of the mass is a signal void artifact corresponding with the biopsy clip. Post biopsy changes are visualized.  Lymph nodes: There are 3 level I left axillary lymph nodes that have thickened cortices worrisome for metastatic disease. The largest lymph node measures 1.3 cm.  Ancillary findings:  None.  IMPRESSION: 1.7 cm mass in the 3 o'clock region of the left breast corresponding with the recently diagnosed invasive mammary carcinoma. Three level I left axillary lymph nodes worrisome for metastatic disease.  RECOMMENDATION: Treatment planning of the known left breast invasive mammary carcinoma is recommended. The patient is being seen at the [facility] on  05/21/2014. If left axillary lymph node ultrasound-guided biopsy is desired this can be scheduled.  BI-RADS CATEGORY  6: Known biopsy-proven malignancy.   Electronically Signed   By: Lillia Mountain M.D.   On: 05/16/2014 12:29   Mm Digital Diagnostic Bilat  05/05/2014   CLINICAL DATA:  48 year old patient with palpable area in the left breast in the 3 o'clock region. She palpates this mass today. Recently, she felt a possible mass in the 6 o'clock region of the left breast. She does not definitely feel this today. According to the patient, her mother has a history of breast cancer and later had her ovaries removed (for possible ovarian cancer).  EXAM: DIGITAL DIAGNOSTIC  BILATERAL MAMMOGRAM WITH CAD  ULTRASOUND LEFT BREAST  COMPARISON:  07/22/2009  ACR Breast Density Category b: There are scattered areas of fibroglandular density.  FINDINGS: Metallic skin marker was placed at the site of palpable concern in the outer left breast. The patient did not definitely is feel second area of recent palpable concern in the inferior left breast, so metallic marker was not placed in this region.  There is an irregular partially obscured mass in the outer left breast, subtending the metallic skin marker. No additional masses identified in the left breast. No mass is identified in the right breast. No suspicious microcalcification or distortion is seen in either breast. Skin appears normal.  Mammographic images were processed with CAD.  On physical exam, there is a firm fixed mass in the 3 o'clock position of the left breast approximately 10 cm from the nipple. I do not palpate a mass in the inferior left breast.  Ultrasound is performed, showing a hypoechoic, irregular solid mass with angular margins. The mass measures 1.8 x 1.5 x 1.2 cm and demonstrates internal color Doppler flow.  Ultrasound of the inferior left breast is negative.  Ultrasound of the left axilla demonstrates 2 suspicious appearing level 1 lymph nodes. The  largest of these lymph nodes measures 1.0 x 0.6 cm and has a thickened pole the cortex. The smaller lymph node is completely hypoechoic, with no visible fatty hilum and measures 0.6 cm.  IMPRESSION: Highly suspicious palpable mass 3 o'clock position of the left breast. This mass measures 1.8 cm greatest diameter. Two suspicious left axillary lymph nodes. The patient will be referred to [facility], as she does not have medical insurance.  RECOMMENDATION: Ultrasound-guided biopsies of the suspicious left breast mass and 1 of 2 suspicious left axillary lymph nodes is recommended. These biopsies will be performed after the patient has been enrolled in [facility].  I have discussed the findings and recommendations with the patient. Results were also provided in writing at the conclusion of the visit. If applicable, a reminder letter will be sent to the patient regarding the next appointment.  BI-RADS CATEGORY  5:  Highly suggestive of malignancy.   Electronically Signed   By: Curlene Dolphin M.D.   On: 05/05/2014 12:12   Mm Digital Diagnostic Unilat L  05/12/2014   ADDENDUM REPORT: 05/12/2014 16:39  ADDENDUM: Pathology results of the left breast ultrasound-guided biopsy demonstrated invasive mammary carcinoma. Pathology results are concordant with imaging findings. Per the patient's request, I discussed pathology results with the patient's daughter, Alaney Witter. Candice reports no problems regarding the patient's biopsy site.  The patient declined a biopsy of suspicious left axillary lymph node at the time of her left breast biopsy, 05/09/2014. I discussed with Alezandra Egli that her mother can discuss the possibility of the left axillary lymph node biopsy again with the surgeon she meets at the [facility], and if the biopsy is desired, we will certainly attempt a left axillary lymph node biopsy for treatment planning purposes.  The following appointments for Mickel Fuchs were given to [facility]  over the telephone: Breast MRI is scheduled 05/16/2014 8:45 a.m.  The patient has an appointment scheduled of the [facility] at [facility] on 05/21/2014 at 8 o'clock a.m.   Electronically Signed   By: Curlene Dolphin M.D.   On: 05/12/2014 16:39   05/12/2014   CLINICAL DATA:  Ultrasound-guided core needle biopsy of a suspicious 1.8 cm mass in the outer left breast earlier today.  EXAM: DIAGNOSTIC LEFT MAMMOGRAM POST ULTRASOUND BIOPSY  COMPARISON:  Previous exams.  FINDINGS: Mammographic images were obtained following ultrasound guided biopsy of the 1.8 cm suspicious mass in the outer left breast. The coil shaped tissue marker clip is positioned at the anterior margin of the mass. Expected post biopsy changes are present without evidence of hematoma.  IMPRESSION: Appropriate positioning of the coil shaped tissue marker clip at the anterior margin of the biopsied mass in the outer left breast.  Final Assessment: Post Procedure Mammograms for Marker Placement  Electronically Signed: By: Evangeline Dakin M.D. On: 05/09/2014 15:04   US Breast Ltd Uni Left Inc Axilla  05/05/2014   CLINICAL DATA:  48 year old patient with palpable area in the left breast in the 3 o'clock region. She palpates this mass today. Recently, she felt a possible mass in the 6 o'clock region of the left breast. She does not definitely feel this today. According to the patient, her mother has a history of breast cancer and later had her ovaries removed (for possible ovarian cancer).  EXAM: DIGITAL DIAGNOSTIC  BILATERAL MAMMOGRAM WITH CAD  ULTRASOUND LEFT BREAST  COMPARISON:  07/22/2009  ACR Breast Density Category b: There are scattered areas of fibroglandular density.  FINDINGS: Metallic skin marker was placed at the site of palpable concern in the outer left breast. The patient did not definitely is feel second area of recent palpable concern in the inferior left breast, so metallic marker was not placed in this  region.  There is an irregular partially obscured mass in the outer left breast, subtending the metallic skin marker. No additional masses identified in the left breast. No mass is identified in the right breast. No suspicious microcalcification or distortion is seen in either breast. Skin appears normal.  Mammographic images were processed with CAD.  On physical exam, there is a firm fixed mass in the 3 o'clock position of the left breast approximately 10 cm from the nipple. I do not palpate a mass in the inferior left breast.  Ultrasound is performed, showing a hypoechoic, irregular solid mass with angular margins. The mass measures  1.8 x 1.5 x 1.2 cm and demonstrates internal color Doppler flow.  Ultrasound of the inferior left breast is negative.  Ultrasound of the left axilla demonstrates 2 suspicious appearing level 1 lymph nodes. The largest of these lymph nodes measures 1.0 x 0.6 cm and has a thickened pole the cortex. The smaller lymph node is completely hypoechoic, with no visible fatty hilum and measures 0.6 cm.  IMPRESSION: Highly suspicious palpable mass 3 o'clock position of the left breast. This mass measures 1.8 cm greatest diameter. Two suspicious left axillary lymph nodes. The patient will be referred to [facility], as she does not have medical insurance.  RECOMMENDATION: Ultrasound-guided biopsies of the suspicious left breast mass and 1 of 2 suspicious left axillary lymph nodes is recommended. These biopsies will be performed after the patient has been enrolled in [facility].  I have discussed the findings and recommendations with the patient. Results were also provided in writing at the conclusion of the visit. If applicable, a reminder letter will be sent to the patient regarding the next appointment.  BI-RADS CATEGORY  5: Highly suggestive of malignancy.   Electronically Signed   By: Curlene Dolphin M.D.   On: 05/05/2014 12:12   [place] Lt Breast Bx W Loc Dev 1st Lesion Img Bx Spec US Guide  05/09/2014    CLINICAL DATA:  Patient presented with a palpable mass in the outer left breast and has a suspicious 1.8 cm mass (3 o'clock, 10 cm from the nipple) on recent imaging. She also has 2 morphologically abnormal lymph nodes on ultrasound.  EXAM: ULTRASOUND GUIDED LEFT BREAST CORE NEEDLE BIOPSY  COMPARISON:  Previous exams.  PROCEDURE: I met with the patient and we discussed the procedure of ultrasound-guided biopsy, including benefits and alternatives. We discussed the high likelihood of a successful procedure. We discussed the risks of the procedure including infection, bleeding, tissue injury, clip migration, and inadequate sampling. Informed written consent was given. The usual time-out protocol was performed immediately prior to the procedure.  Using sterile technique and 2% Lidocaine as local anesthetic, under direct ultrasound visualization, initially a 12 gauge Bard Marquee core needledevice was used to perform biopsy of the 1.8 cm mass in the outer left breast using an inferior approach. I then used a 12 gauge vacuum assisted device to obtain a final core sample. At the conclusion of the procedure, a coil shaped tissue marker clip was deployed into the biopsy cavity. Follow-up 2-view mammogram was performed and dictated separately.  IMPRESSION: Ultrasound-guided biopsy of the suspicious 1.8 cm mass in the outer left breast. No apparent complications.  The patient did not wish to proceed with biopsy of the left axillary lymph nodes today, despite my efforts to convince her to do so.   Electronically Signed   By: Evangeline Dakin M.D.   On: 05/09/2014 15:02      ASSESSMENT AND PLAN: Breast cancer of upper-outer quadrant of left female breast Pt has a clinical T1N0-1Mx breast cancer.  She does have abnormal appearing nodes on imaging, but these are not positive by pathology.  Her mass is clearly palpable, so we will do a lumpectomy with SLN Bx with intraoperative assessment and possible ALND.  The  surgical procedure was described to the patient.  I discussed the incision type and location and that we would need radiology involved on the day of surgery with a sentinel node injection.      The risks and benefits of the procedure were described to the patient and she  wishes to proceed.    We discussed the risks bleeding, infection, damage to other structures, need for further procedures/surgeries.  We discussed the risk of seroma.  The patient was advised that we may need to go back to surgery for additional tissue to obtain negative margins or for additional nodes. We discussed the rationale for intraoperative ALND.  The patient was advised that these are the most common complications, but that others can occur as well.  They were advised against taking aspirin or other anti-inflammatory agents/blood thinners the week before surgery.    Pt is very anxious and requested pain medication.  I advised her to add tylenol to her ibuprofen regimen.  I did not prescribe any narcotics.      She will need postoperative radiation.  If she is LN positive, she will need a port a cath for chemo.  If LN negative, we will need Oncotype DX.   45 min spent in evaluation, examination, counseling, and coordination of care.  >50% of time in counseling.    Milus Height MD Surgical Oncology, General and [facility]      Visit Diagnoses: 1. Breast cancer of upper-outer quadrant of left female breast     Primary Care Physician: No PCP Per Patient

## 2014-06-11 NOTE — Anesthesia Postprocedure Evaluation (Signed)
  Anesthesia Post-op Note  Patient: Laura Matthews   Procedure(s) Performed: Procedure(s): BREAST LUMPECTOMY WITH SENTINEL LYMPH NODE BX, POSSIBLE AXILLARY LYMPH NODE DISSECTION (Left)  Patient Location: PACU  Anesthesia Type:General  Level of Consciousness: awake  Airway and Oxygen Therapy: Patient Spontanous Breathing  Post-op Pain: mild  Post-op Assessment: Post-op Vital signs reviewed  Post-op Vital Signs: Reviewed  Last Vitals:  Filed Vitals:   06/11/14 1716  BP: 155/95  Pulse: 91  Temp: 37 C  Resp: 12    Complications: No apparent anesthesia complications

## 2014-06-11 NOTE — Anesthesia Preprocedure Evaluation (Signed)
Anesthesia Evaluation  Patient identified by MRN, date of birth, ID band Patient awake    Reviewed: Allergy & Precautions, H&P , NPO status , Patient's Chart, lab work & pertinent test results  History of Anesthesia Complications Negative for: history of anesthetic complications  Airway Mallampati: I TM Distance: >3 FB Neck ROM: Full    Dental  (+) Teeth Intact   Pulmonary neg sleep apnea, neg COPDneg recent URI, Current Smoker,  breath sounds clear to auscultation        Cardiovascular negative cardio ROS  Rhythm:Regular     Neuro/Psych  Headaches, PSYCHIATRIC DISORDERS Depression    GI/Hepatic Neg liver ROS, GERD-  Medicated and Controlled,  Endo/Other  negative endocrine ROS  Renal/GU negative Renal ROS     Musculoskeletal   Abdominal   Peds  Hematology negative hematology ROS (+)   Anesthesia Other Findings   Reproductive/Obstetrics                           Anesthesia Physical Anesthesia Plan  ASA: II  Anesthesia Plan: General   Post-op Pain Management:    Induction: Intravenous  Airway Management Planned: LMA  Additional Equipment: None  Intra-op Plan:   Post-operative Plan: Extubation in OR  Informed Consent: I have reviewed the patients History and Physical, chart, labs and discussed the procedure including the risks, benefits and alternatives for the proposed anesthesia with the patient or authorized representative who has indicated his/her understanding and acceptance.   Dental advisory given  Plan Discussed with: CRNA and Surgeon  Anesthesia Plan Comments:         Anesthesia Quick Evaluation

## 2014-06-11 NOTE — Op Note (Signed)
Left Breast Lumpectomy with Sentinel Node Mapping and Biopsy, axillary lymph node dissection  Indications: This patient presents with history of left breast cancer with non-palpable axillary lymph node exam, but concerning imaging findings.  Pre-operative Diagnosis: left breast cancer  Post-operative Diagnosis: left breast cancer metastatic to lymph nodes  Surgeon: Stark Klein   Assistants: n/a  Anesthesia: General LMA anesthesia and Local anesthesia 1% plain lidocaine, 0.25.% bupivacaine, with epinephrine  ASA Class: 2  Procedure Details  The patient was seen in the Holding Room. The risks, benefits, complications, treatment options, and expected outcomes were discussed with the patient. The possibilities of reaction to medication, pulmonary aspiration, bleeding, infection, the need for additional procedures, failure to diagnose a condition, and creating a complication requiring transfusion or operation were discussed with the patient. The patient concurred with the proposed plan, giving informed consent.  The site of surgery properly noted/marked. The patient was taken to Operating Room # 6, identified as TOPACIO CELLA and the procedure verified as left Breast Lumpectomy and Sentinel Node Biopsy, possible ALND. A Time Out was held and the above information confirmed.  After induction of anesthesia, the left arm, breast, and chest were prepped and draped in standard fashion.  Using a hand-held gamma probe, axillary sentinel nodes were identified transcutaneously.  An oblique incision was created below the axillary hairline.  Dissection was carried through the clavipectoral fascia.  5 level 2 axillary sentinel nodes were removed and submitted to pathology revealing malignant histologic findings.       The lumpectomy was performed by creating an transverse incision over the upper outer quadrant of the breast.  Dissection was carried down to the pectoral fascia posteriorly.  The specimen  margins were inked.  Clips were placed into the cavity.  Hemostasis was achieved with cautery.  The wound was irrigated and closed with a 3-0 Vicryl deep dermal interrupted and a 4-0 Monocryl subcuticular closure in layers.    The axillary incision was extended. An axillary dissection was performed with removal of the associated lymph nodes and surrounding adipose tissue. This included levels I and II. The lymph node tissue was extremely sticky and did not dissect as easily as one would expect.  This was accomplished by exposing the axillary vein at the superior border of the dissection. The medial border was the pectoralis minor and laterally to the latissimus dorsi muscle. Posteriorly, the dissection continued to the subscapularis.  Small venous tributaries, lymphatics, and vessels were clipped and ligated or cauterized and divided. The lateral pectoral nerve was clipped as it was going directly into a clump of adherent lymph nodes.  The subscapularis muscle was skeletonized. The long thoracic and thoracodorsal neurovascular bundles were identified and preserved.  The wound was irrigated and closed with a 3-0 Vicryl deep dermal interrupted and a 4-0 vicryl subcuticular closure in layers.  Sterile dressings were applied. At the end of the operation, all sponge, instrument, and needle counts were correct.  Findings: grossly clear surgical margins and 5 axillary sentinel lymph nodes, the first was grossly positive  Estimated Blood Loss:  Minimal         Drains: One 19 Fr Blake drain         Specimens: left breast lumpectomy and 5 sentinel nodes, left axillary contents                Complications:  None; patient tolerated the procedure well.         Disposition: PACU - hemodynamically stable.  Condition: stable

## 2014-06-11 NOTE — Discharge Instructions (Addendum)
[place] Office Phone Number [phone]  BREAST BIOPSY/ PARTIAL MASTECTOMY: POST OP INSTRUCTIONS  Always review your discharge instruction sheet given to you by the facility where your surgery was performed.  IF YOU HAVE DISABILITY OR FAMILY LEAVE FORMS, YOU MUST BRING THEM TO THE OFFICE FOR PROCESSING.  DO NOT GIVE THEM TO YOUR DOCTOR.  1. A prescription for pain medication may be given to you upon discharge.  Take your pain medication as prescribed, if needed.  If narcotic pain medicine is not needed, then you may take acetaminophen (Tylenol) or ibuprofen (Advil) as needed. 2. Take your usually prescribed medications unless otherwise directed 3. If you need a refill on your pain medication, please contact your pharmacy.  They will contact our office to request authorization.  Prescriptions will not be filled after 5pm or on week-ends. 4. You should eat very light the first 24 hours after surgery, such as soup, crackers, pudding, etc.  Resume your normal diet the day after surgery. 5. Most patients will experience some swelling and bruising in the breast.  Ice packs and a good support bra will help.  Swelling and bruising can take several days to resolve.  6. It is common to experience some constipation if taking pain medication after surgery.  Increasing fluid intake and taking a stool softener will usually help or prevent this problem from occurring.  A mild laxative (Milk of Magnesia or Miralax) should be taken according to package directions if there are no bowel movements after 48 hours. 7. Unless discharge instructions indicate otherwise, you may remove your bandages 48 hours after surgery, and you may shower at that time.  You may have steri-strips (small skin tapes) in place directly over the incision.  These strips should be left on the skin for 7-10 days.   Any sutures or staples will be removed at the office during your follow-up visit. 8. ACTIVITIES:  You may resume  regular daily activities (gradually increasing) beginning the next day.  Wearing a good support bra or sports bra (or the breast binder) minimizes pain and swelling.  You may have sexual intercourse when it is comfortable. a. You may drive when you no longer are taking prescription pain medication, you can comfortably wear a seatbelt, and you can safely maneuver your car and apply brakes. b. RETURN TO WORK:  __________1-2 weeks_______________ 9. You should see your doctor in the office for a follow-up appointment approximately two weeks after your surgery.  Your doctors nurse will typically make your follow-up appointment when she calls you with your pathology report.  Expect your pathology report 2-3 business days after your surgery.  You may call to check if you do not hear from [place] after three days.   WHEN TO CALL YOUR DOCTOR: 1. Fever over 101.0 2. Nausea and/or vomiting. 3. Extreme swelling or bruising. 4. Continued bleeding from incision. 5. Increased pain, redness, or drainage from the incision.  The clinic staff is available to answer your questions during regular business hours.  Please dont hesitate to call and ask to speak to one of the nurses for clinical concerns.  If you have a medical emergency, go to the nearest emergency room or call 911.  A surgeon from [facility] is always on call at the hospital.  For further questions, please visit [email or web address]     Post Anesthesia Home Care Instructions  Activity: Get plenty of rest for the remainder of the day. A responsible adult should stay with you for  24 hours following the procedure.  For the next 24 hours, DO NOT: -Drive a car -Paediatric nurse -Drink alcoholic beverages -Take any medication unless instructed by your physician -Make any legal decisions or sign important papers.  Meals: Start with liquid foods such as gelatin or soup. Progress to regular foods as tolerated. Avoid greasy, spicy,  heavy foods. If nausea and/or vomiting occur, drink only clear liquids until the nausea and/or vomiting subsides. Call your physician if vomiting continues.  Special Instructions/Symptoms: Your throat may feel dry or sore from the anesthesia or the breathing tube placed in your throat during surgery. If this causes discomfort, gargle with warm salt water. The discomfort should disappear within 24 hours. JP Drain [employer] this sheet to all of your post-operative appointments while you have your drains.  Please measure your drains by CC's or ML's.  Make sure you drain and measure your JP Drains 3 times per day.  At the end of each day, add up totals for the left side and add up totals for the right side.    ( 9 am )     ( 3 pm )        ( 9 pm )                Date L  R  L  R  L  R  Total L/R

## 2014-06-11 NOTE — Transfer of Care (Signed)
Immediate Anesthesia Transfer of Care Note  Patient: Laura Matthews  Procedure(s) Performed: Procedure(s): BREAST LUMPECTOMY WITH SENTINEL LYMPH NODE BX, POSSIBLE AXILLARY LYMPH NODE DISSECTION (Left)  Patient Location: PACU  Anesthesia Type:GA combined with regional for post-op pain  Level of Consciousness: awake, alert  and patient cooperative  Airway & Oxygen Therapy: Patient Spontanous Breathing and Patient connected to face mask oxygen  Post-op Assessment: Report given to PACU RN and Post -op Vital signs reviewed and stable  Post vital signs: Reviewed and stable  Complications: No apparent anesthesia complications

## 2014-06-11 NOTE — Progress Notes (Signed)
Assisted Dr. Ermalene Postin with left, ultrasound guided, pectoralis block. Side rails up, monitors on throughout procedure. See vital signs in flow sheet. Tolerated Procedure well.

## 2014-06-11 NOTE — Interval H&P Note (Signed)
History and Physical Interval Note:  06/11/2014 2:37 PM  Laura Matthews  has presented today for surgery, with the diagnosis of left breast cancer  The various methods of treatment have been discussed with the patient and family. After consideration of risks, benefits and other options for treatment, the patient has consented to  Procedure(s): BREAST LUMPECTOMY WITH SENTINEL LYMPH NODE BX, POSSIBLE AXILLARY LYMPH NODE DISSECTION (Left) as a surgical intervention .  The patient's history has been reviewed, patient examined, no change in status, stable for surgery.  I have reviewed the patient's chart and labs.  Questions were answered to the patient's satisfaction.     Shemar Plemmons

## 2014-06-11 NOTE — Anesthesia Procedure Notes (Addendum)
Anesthesia Regional Block:  Pectoralis block  Pre-Anesthetic Checklist: ,, timeout performed, Correct Patient, Correct Site, Correct Laterality, Correct Procedure, Correct Position, risks and benefits discussed, surgical consent, pre-op evaluation,  At surgeon's request and post-op pain management  Laterality: N/A and Left  Prep: chloraprep       Needles:  Injection technique: Single-shot  Needle Type: Echogenic Needle          Additional Needles:  Procedures: ultrasound guided (picture in chart) Pectoralis block Narrative:  Start time: 06/11/2014 1:57 PM End time: 06/11/2014 2:04 PM Injection made incrementally with aspirations every 5 mL.  Performed by: Personally  Anesthesiologist: Moser  Additional Notes: H+P and labs reviewed, risks and benefits discussed with patient, procedure tolerated well without complications   Procedure Name: LMA Insertion Date/Time: 06/11/2014 2:59 PM Performed by: Jandi Swiger Pre-anesthesia Checklist: Patient identified, Emergency Drugs available, Suction available and Patient being monitored Patient Re-evaluated:Patient Re-evaluated prior to inductionOxygen Delivery Method: Circle System Utilized Preoxygenation: Pre-oxygenation with 100% oxygen Intubation Type: IV induction Ventilation: Mask ventilation without difficulty LMA: LMA inserted LMA Size: 4.0 Number of attempts: 1 Airway Equipment and Method: bite block Placement Confirmation: positive ETCO2 Tube secured with: Tape Dental Injury: Teeth and Oropharynx as per pre-operative assessment

## 2014-06-11 NOTE — Progress Notes (Signed)
Emotional support during breast injections °

## 2014-06-12 ENCOUNTER — Encounter (HOSPITAL_BASED_OUTPATIENT_CLINIC_OR_DEPARTMENT_OTHER): Payer: Self-pay | Admitting: General Surgery

## 2014-06-12 DIAGNOSIS — D0590 Unspecified type of carcinoma in situ of unspecified breast: Secondary | ICD-10-CM | POA: Diagnosis not present

## 2014-06-16 ENCOUNTER — Telehealth (INDEPENDENT_AMBULATORY_CARE_PROVIDER_SITE_OTHER): Payer: Self-pay | Admitting: General Surgery

## 2014-06-16 NOTE — Telephone Encounter (Signed)
Left message to call back about pathology

## 2014-06-20 ENCOUNTER — Telehealth: Payer: Self-pay | Admitting: *Deleted

## 2014-06-20 ENCOUNTER — Telehealth: Payer: Self-pay | Admitting: Oncology

## 2014-06-20 ENCOUNTER — Ambulatory Visit (HOSPITAL_BASED_OUTPATIENT_CLINIC_OR_DEPARTMENT_OTHER): Payer: Medicaid Other | Admitting: Oncology

## 2014-06-20 ENCOUNTER — Other Ambulatory Visit (INDEPENDENT_AMBULATORY_CARE_PROVIDER_SITE_OTHER): Payer: Self-pay | Admitting: General Surgery

## 2014-06-20 VITALS — BP 125/70 | HR 98 | Temp 98.3°F | Resp 20 | Ht 70.0 in | Wt 174.5 lb

## 2014-06-20 DIAGNOSIS — C773 Secondary and unspecified malignant neoplasm of axilla and upper limb lymph nodes: Secondary | ICD-10-CM

## 2014-06-20 DIAGNOSIS — C50412 Malignant neoplasm of upper-outer quadrant of left female breast: Secondary | ICD-10-CM

## 2014-06-20 DIAGNOSIS — F17200 Nicotine dependence, unspecified, uncomplicated: Secondary | ICD-10-CM

## 2014-06-20 DIAGNOSIS — C50419 Malignant neoplasm of upper-outer quadrant of unspecified female breast: Secondary | ICD-10-CM

## 2014-06-20 DIAGNOSIS — C50912 Malignant neoplasm of unspecified site of left female breast: Secondary | ICD-10-CM

## 2014-06-20 MED ORDER — HYDROMORPHONE HCL 2 MG PO TABS: ORAL_TABLET | ORAL | Status: DC

## 2014-06-20 NOTE — Telephone Encounter (Signed)
, °

## 2014-06-20 NOTE — Progress Notes (Signed)
[name]  Telephone:([phone]) [phone] Fax:(336) [phone]     ID: Laura Matthews DOB: Jul 27, 1966  MR#: [id number]  POE#:[id number]  Patient Care Team: No Pcp Per Patient as PCP - General (General Practice) Stark Klein, MD as Consulting Physician (General Surgery) Chauncey Cruel, MD as Consulting Physician (Oncology) Thea Silversmith, MD as Consulting Physician (Radiation Oncology) [facility] Bunnie Pion, NP as Nurse Practitioner (Nurse Practitioner)  CHIEF COMPLAINT: Newly diagnosed breast cancer  CURRENT TREATMENT: to start adjuvant chemotherapy   BREAST CANCER HISTORY: From the original intake note:  The patient herself palpated a mass in her left breast and brought it to the attention of [facility] NP 04/22/2014. She palpated a large nontender mass at the 4:00 position as well as a small tender mass in the 6:00 position of the left breast. The patient was set up for imaging at the breast Center where on 05/05/2014 she underwent bilateral diagnostic mammography and left breast ultrasonography. The mammogram showed an irregular mass in the outer left breast with no other suspicious findings. This was firm and fixed at the 3:00 position approximately 10 cm from the nipple. Ultrasound showed a hypoechoic irregular solid mass in this area, measuring 1.8 cm. Ultrasound of the left axilla showed 2 suspicious level I lymph nodes, the largest measuring 1 cm, with a thickened pole. A smaller lymph node measured 0.6 cm but had no visible fatty hilum.  On 05/09/2014 the patient underwent biopsy of the left breast mass (she refused biopsy of the left axilla) which showed ([id number]) and invasive ductal carcinoma, grade 2, estrogen receptor positive at 100%, with strong staining intensity, progesterone receptor 80% positive, with weak staining intensity, with an MIB-1 of 80%, and no HER-2 amplification.  On 05/16/2014 the patient underwent bilateral breast MRI. This showed a 1.7 cm irregular  enhancing mass at the 3:00 position of the left breast associated with the biopsy cleft. There were 3 level I left axillary lymph nodes with thickened cortices. The largest measured 1.3 cm. There were no other findings of concern.  Her subsequent history is as detailed below.  INTERVAL HISTORY: Laura Matthews returns today for followup of her breast cancer. Her last visit here she was staged with a PET scan which confirmed a lesion in the upper outer left breast and a single 1 hot" left axillary lymph node. There was no evidence of disease in the lungs, liver, or bones, or any evidence of metastatic disease. MRI of the brain 06/02/2014 was motion degraded, but showed no evidence of intracranial metastatic disease. Because of problems with nausea and vomiting, the patient had a CT of the abdomen and pelvis 06/02/2014. This showed hepatic steatosis and a benign left adrenal adenoma, measuring 1 cm. Again there was no evidence of metastatic disease.  Accordingly, on 06/11/2014 the patient underwent left lumpectomy with sentinel lymph node sampling, and since the lymph nodes were found to be positive, if follicular he lymph node dissection was performed. The final pathology ([id number]) showed a 1.8 cm invasive ductal carcinoma, grade 3, with close but negative margins. However for all 5 sentinel lymph nodes were positive, and an additional 7/22 non-sentinel lymph nodes were also involved (1 with a micrometastatic deposit, and the rest with macro metastatic involvement) for a total of 12 lymph nodes positive out of 27 removed. The patient is here today to discuss next steps in her treatment.   REVIEW OF SYSTEMS: Laura Matthews continues to have significant pain from her surgery. She still has 2  Drains in Pl. She is draining the bulbs about 3 times a day. She was given oxycodone 5 mg and has been taking 2 tablets 3 times a day with very little relief. She denies constipation. She has numbness in the left axilla in the medial  aspect of the left upper extremity, and initially noted some swelling of the arm, but that has resolved. There have been no fevers, no bleeding, and the rest of the detailed review of systems today was noncontributory  PAST MEDICAL HISTORY: Past Medical History  Diagnosis Date  . Mental disorder     depression-  . Depression   . Arthritis   . Hot flashes   . Head ache   . Malignant neoplasm of breast (female), unspecified site   . Family history of malignant neoplasm of breast     PAST SURGICAL HISTORY: Past Surgical History  Procedure Laterality Date  . Knee surgery  1995    right  . Foot surgery  2011    rt  . Tubal ligation    . Uterine fibroid surgery  2005    ablation  . Breast lumpectomy with sentinel lymph node biopsy Left 06/11/2014    Procedure: BREAST LUMPECTOMY WITH SENTINEL LYMPH NODE BX, POSSIBLE AXILLARY LYMPH NODE DISSECTION;  Surgeon: Stark Klein, MD;  Location: [place];  Service: General;  Laterality: Left;    FAMILY HISTORY Family History  Problem Relation Age of Onset  . Breast cancer Mother 89    currently 17; TAH/BSO d/t ?cervical ca at 23  . Breast cancer Maternal Aunt     dx 53s; currently in her late 25s  . Lung cancer Father     deceased 14   the patient's father died at the age of 59 from lung cancer. The patient's mother was diagnosed with breast cancer at the age of 53. She survives. The patient had 2 brothers, and 2 sisters. One brother may have a diagnosis of "stomach cancer"  GYNECOLOGIC HISTORY:  No LMP recorded. Patient has had an ablation. Menarche age 45 which is also when the patient first had a child. She is GX P3. She stopped having periods after laser ablation in 2005.  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 At home she lives with her daughter [name] and her 4 children who are 48, 8, 5, and 4. 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 The patient's son Laura Matthews  is a fast food cook in [place] and the patient's daughter Laura Matthews works as a Scientist, water quality, also in [place]. The patient has 7 grandchildren. She is not a church attender    ADVANCED DIRECTIVES: Not in place; this was discussed with the patient on her first visit, 05/21/2014, and she indicated she plans to name her daughter [name] as her healthcare power of attorney   HEALTH MAINTENANCE: History  Substance Use Topics  . Smoking status: Current Every Day Smoker -- 0.25 packs/day    Types: Cigarettes  . Smokeless tobacco: Never Used  . Alcohol Use: No     Comment: Percoset,Vicodin, Oxycotin     Colonoscopy:  PAP:  Bone density:  Lipid panel:  Allergies  Allergen Reactions  . Norco [Hydrocodone-Acetaminophen] Itching and Nausea Only    Pt reported allergy.    Current Outpatient Prescriptions  Medication Sig Dispense Refill  . HYDROmorphone (DILAUDID) 2 MG tablet Take 1 or 2 tablets every 6 hours as needed for pain  60 tablet  0  . ondansetron (ZOFRAN ODT) 8 MG disintegrating tablet Take 1 tablet (8 mg total) by mouth every 8 (eight) hours as needed for nausea or vomiting.  20 tablet  0  . oxyCODONE-acetaminophen (ROXICET) 5-325 MG per tablet Take 1-2 tablets by mouth every 4 (four) hours as needed for severe pain.  50 tablet  0  . ranitidine (ZANTAC) 150 MG tablet Take 1 tablet (150 mg total) by mouth 2 (two) times daily.  60 tablet  0   No current facility-administered medications for this visit.    OBJECTIVE: Middle-aged [place] American woman who appears stated age 84 Vitals:   06/20/14 1502  BP: 125/70  Pulse: 98  Temp: 98.3 F (36.8 C)  Resp: 20     Body mass index is 25.04 kg/(m^2).    ECOG FS:1 - Symptomatic but completely ambulatory  Sclerae unicteric, pupils round and equal Oropharynx clear and moist-- no lesions noted No cervical or supraclavicular adenopathy Lungs no rales or rhonchi Heart regular rate and rhythm Abd soft, nontender, positive bowel  sounds MSK no focal spinal tenderness, no left upper extremity lymphedema Neuro: nonfocal, well oriented, appropriate affect Breasts: The right breast is unremarkable. The left breast is status post recent surgery. There is no evidence of dehiscence, erythema, or unusual swelling. 2 drains are still in place, with about 15 cc of serosanguineous fluid in the bulbs.   LAB RESULTS:  CMP     Component Value Date/Time   NA 138 06/03/2014 1326   NA 138 05/21/2014 0837   K 3.5* 06/03/2014 1326   K 4.0 05/21/2014 0837   CL 95* 06/03/2014 1326   CO2 30 06/03/2014 1326   CO2 24 05/21/2014 0837   GLUCOSE 114* 06/03/2014 1326   GLUCOSE 214* 05/21/2014 0837   BUN 14 06/03/2014 1326   BUN 8.1 05/21/2014 0837   CREATININE 0.63 06/03/2014 1326   CREATININE 0.7 05/21/2014 0837   CALCIUM 10.2 06/03/2014 1326   CALCIUM 9.1 05/21/2014 0837   PROT 8.6* 06/03/2014 1326   PROT 7.3 05/21/2014 0837   ALBUMIN 4.7 06/03/2014 1326   ALBUMIN 4.0 05/21/2014 0837   AST 21 06/03/2014 1326   AST 19 05/21/2014 0837   ALT 21 06/03/2014 1326   ALT 23 05/21/2014 0837   ALKPHOS 75 06/03/2014 1326   ALKPHOS 57 05/21/2014 0837   BILITOT 0.5 06/03/2014 1326   BILITOT 0.34 05/21/2014 0837   GFRNONAA >90 06/03/2014 1326   GFRAA >90 06/03/2014 1326    I No results found for this basename: SPEP,  UPEP,   kappa and lambda light chains    Lab Results  Component Value Date   WBC 14.5* 06/03/2014   NEUTROABS 10.3* 06/03/2014   HGB 15.1* 06/03/2014   HCT 44.1 06/03/2014   MCV 88.4 06/03/2014   PLT 334 06/03/2014      Chemistry      Component Value Date/Time   NA 138 06/03/2014 1326   NA 138 05/21/2014 0837   K 3.5* 06/03/2014 1326   K 4.0 05/21/2014 0837   CL 95* 06/03/2014 1326   CO2 30 06/03/2014 1326   CO2 24 05/21/2014 0837   BUN 14 06/03/2014 1326   BUN 8.1 05/21/2014 0837   CREATININE 0.63 06/03/2014 1326   CREATININE 0.7 05/21/2014 0837      Component Value Date/Time   CALCIUM 10.2 06/03/2014 1326   CALCIUM 9.1 05/21/2014 0837   ALKPHOS 75 06/03/2014 1326    ALKPHOS 57 05/21/2014 0837   AST  21 06/03/2014 1326   AST 19 05/21/2014 0837   ALT 21 06/03/2014 1326   ALT 23 05/21/2014 0837   BILITOT 0.5 06/03/2014 1326   BILITOT 0.34 05/21/2014 0837       No results found for this basename: LABCA2    No components found with this basename: LABCA125    No results found for this basename: INR,  in the last 168 hours  Urinalysis    Component Value Date/Time   COLORURINE YELLOW 06/03/2014 [place] 06/03/2014 1050   LABSPEC >1.030* 06/03/2014 1050   PHURINE 6.0 06/03/2014 1050   GLUCOSEU NEGATIVE 06/03/2014 1050   HGBUR SMALL* 06/03/2014 1050   BILIRUBINUR SMALL* 06/03/2014 [place] 06/03/2014 1050   PROTEINUR NEGATIVE 06/03/2014 1050   UROBILINOGEN 0.2 06/03/2014 1050   NITRITE NEGATIVE 06/03/2014 1050   LEUKOCYTESUR TRACE* 06/03/2014 1050    STUDIES: [name] Wo Contrast  2014/06/17   CLINICAL DATA:  Newly diagnosed breast cancer, staging.  EXAM: MRI HEAD WITHOUT AND WITH CONTRAST  TECHNIQUE: Multiplanar, multiecho pulse sequences of the brain and surrounding structures were obtained without and with intravenous contrast.  CONTRAST:  MultiHance 15 mL.  COMPARISON:  CT scan 05/29/2014.  FINDINGS: The patient was unable to remain motionless for the exam. Small or subtle lesions could be overlooked.  No evidence for acute infarction, hemorrhage, mass lesion, hydrocephalus, or extra-axial fluid. Normal cerebral volume. No white matter disease. Post infusion, no abnormal enhancement of the brain or meninges. Flow voids are maintained throughout the carotid, basilar, and vertebral arteries. There are no areas of chronic hemorrhage. Pituitary, pineal, and cerebellar tonsils unremarkable. No upper cervical lesions. Visualized calvarium, skull base, and upper cervical osseous structures unremarkable. Scalp and extracranial soft tissues, orbits, sinuses, and mastoids show no acute process.  IMPRESSION: Motion degraded examination.  No definite  intracranial metastases.   Electronically Signed   By: Rolla Flatten M.D.   On: 06/17/2014 19:56   Ct Abdomen Pelvis W Contrast  06/03/2014   CLINICAL DATA:  Nausea and vomiting for 3 days, breast cancer.  EXAM: CT ABDOMEN AND PELVIS WITH CONTRAST  TECHNIQUE: Multidetector CT imaging of the abdomen and pelvis was performed using the standard protocol following bolus administration of intravenous contrast.  CONTRAST:  167mL OMNIPAQUE IOHEXOL 300 MG/ML  SOLN  COMPARISON:  PET-CT May 29, 2014  FINDINGS: LUNG BASES: Included view of the lung bases are clear. Visualized heart and pericardium are unremarkable.  SOLID ORGANS: The spleen, pancreas and right adrenal gland are unremarkable. Diffusely hypodense liver consistent with fatty infiltration, which is otherwise unremarkable. 10 mm left adrenal nodule, which measured 9 Hounsfield units on prior nonenhanced PET-CT consistent with benign adenoma. Gallbladder a shading without CT findings of acute cholecystitis.  GASTROINTESTINAL TRACT: The stomach, small and large bowel are normal in course and caliber without inflammatory changes. Normal appendix.  KIDNEYS/ URINARY TRACT: Kidneys are orthotopic, demonstrating symmetric enhancement. No nephrolithiasis, hydronephrosis or solid renal masses. The unopacified ureters are normal in course and caliber. Delayed imaging through the kidneys demonstrates symmetric prompt contrast excretion within the proximal urinary collecting system. Urinary bladder is partially distended and unremarkable.  PERITONEUM/RETROPERITONEUM: No intraperitoneal free fluid nor free air. Aortoiliac vessels are normal in course and caliber, mild calcific atherosclerosis. No lymphadenopathy by CT size criteria. Internal reproductive organs are unremarkable.  SOFT TISSUE/OSSEOUS STRUCTURES: Nodule left breast with biopsy clip, corresponding to PET-CT abnormality.  IMPRESSION: No acute intra-abdominal or pelvic process.  Hepatic  steatosis. Possible  gallbladder sludge without CT findings of acute cholecystitis.  Left breast nodule corresponding to a PET-CT abnormality.   Electronically Signed   By: Elon Alas   On: 06/03/2014 15:30   Nm Pet Image Initial (pi) Skull Base To Thigh  05/29/2014   CLINICAL DATA:  Initial Treatment strategy for left breast cancer.  EXAM: NUCLEAR MEDICINE PET SKULL BASE TO THIGH  TECHNIQUE: 9.9 mCi F-18 FDG was injected intravenously. Full-ring PET imaging was performed from the skull base to thigh after the radiotracer. CT data was obtained and used for attenuation correction and anatomic localization.  FASTING BLOOD GLUCOSE:  Value: 151 mg/dl  COMPARISON:  None.  FINDINGS: NECK  No hypermetabolic lymph nodes in the neck.  CHEST  14 x 12 mm lesion in the upper outer left breast with adjacent surgical clip (series 4/image 78), corresponding to biopsy-proven left breast cancer, max SUV 6.1.  5 mm short axis left axillary node series 4/image 60) with associated hypermetabolism, max SUV 8.3, suspicious for nodal metastasis.  No hypermetabolic mediastinal or hilar nodes.  No suspicious pulmonary nodules on the CT scan.  ABDOMEN/PELVIS  No abnormal hypermetabolic activity within the liver, pancreas, adrenal glands, or spleen.  No hypermetabolic lymph nodes in the abdomen or pelvis.  SKELETON  No focal hypermetabolic activity to suggest skeletal metastasis.  IMPRESSION: Left breast cancer with left axillary nodal metastasis.  No evidence of distant metastasis.   Electronically Signed   By: Julian Hy M.D.   On: 05/29/2014 13:22   Nm Sentinel Node Inj-no Rpt (breast)  06/11/2014   CLINICAL DATA: left breast cancer   Sulfur colloid was injected intradermally by the nuclear medicine  technologist for breast cancer sentinel node localization.     ASSESSMENT: 48 y.o. [place] woman status post left breast upper outer quadrant biopsy 05/09/2014 for a clinicalT1c N1, stage IIA invasive ductal carcinoma, grade 2, estrogen  and progesterone receptor positive, HER-2 nonamplified, with an MIB-1 of 80%  (1) patient met with genetics counselor 05/29/2014 but decided against genetics testing   (2) tobacco abuse. The patient has been strongly urged to discontinue smoking.  (3) status post left lumpectomy and axillary lymph node dissection 06/11/2014 for a pT1c pN3, stage IIIC invasive ductal carcinoma, grade 3, with repeat HER-2 negative  (4) adjuvant chemotherapy to consist of doxorubicin and cyclophosphamide in dose dense fashion x4, with Neulasta support, to start 07/15/2014, to be followed by paclitaxel weekly x12  (5) adjuvant radiation to follow chemotherapy  (6) anti-estrogens for 5-10 years to follow radiation  PLAN: I spent approximately one hour with Carmelita going over her situation. She turns out to have stage III disease. She understands the risk of having metastatic micrometastases is high. She will need optimal adjuvant systemic therapy, which will consist of chemotherapy first, then after she completes her local treatment with radiation, further systemic treatment with anti-estrogens for 5-10 years.  We discussed the possible toxicities, side effects and complications of chemotherapy, including the possibility of injuring the heart muscle, peripheral neuropathy, and significant compromise of her immune system.  The patient will need a port, an echocardiogram, and instruction from our "chemotherapy school". She will then return to see [place] September 9, and at that visit we will review the results of these studies, instructed patients on how to take her antinausea and other supportive medicines, and place those orders. The plan is to start chemotherapy on September 15, and she will be seeing essentially on a weekly basis here  for the next 5 months.  Today I entered the lab and chemotherapy orders, and have referred the patient for port placement an echocardiogram. I also gave the patient a prescription for  Dilaudid 2 mg to take 2 tablets 4 times a day as needed. I suspect once her drains are removed her pain will decrease and she will be able to use the Percocet. After that we will switch her to naproxen 3 times a day with food as needed.  Ashlen has a good understanding of the overall plan. She agrees with it. She knows a goal of treatment in her case is cure. She will call with any problems that may develop before her next visit here.  Chauncey Cruel, MD   06/21/2014 8:31 AM

## 2014-06-20 NOTE — Telephone Encounter (Signed)
Per staff message and POF I have scheduled appts. Advised scheduler of appts. JMW  

## 2014-06-23 ENCOUNTER — Ambulatory Visit (INDEPENDENT_AMBULATORY_CARE_PROVIDER_SITE_OTHER): Payer: Medicaid Other | Admitting: General Surgery

## 2014-06-23 VITALS — BP 130/82 | HR 107 | Temp 98.6°F | Ht 70.0 in | Wt 173.1 lb

## 2014-06-23 DIAGNOSIS — C50419 Malignant neoplasm of upper-outer quadrant of unspecified female breast: Secondary | ICD-10-CM

## 2014-06-23 DIAGNOSIS — C50412 Malignant neoplasm of upper-outer quadrant of left female breast: Secondary | ICD-10-CM

## 2014-06-23 MED ORDER — OXYCODONE-ACETAMINOPHEN 5-325 MG PO TABS: 1.0000 | ORAL_TABLET | ORAL | Status: DC | PRN

## 2014-06-23 NOTE — Patient Instructions (Signed)
Follow-up next week.

## 2014-06-23 NOTE — Progress Notes (Signed)
HISTORY: Pt is around 2 weeks s/p left lumpectomy and sentinel lymph node biopsy.  She is having a lot of swelling and bruising on the left breast.  She is also having a bit of drainage around her drain.  Her drain output is 35-50/day.  She denies fever/chills.      EXAM: General:  Alert and oriented. Incision:  No evidence of infection.  Liquid hematoma   PATHOLOGY: Diagnosis 1. Lymph node, sentinel, biopsy, Left axillary #1 - METASTATIC CARCINOMA IN ONE LYMPH NODE (1/1). 2. Lymph node, sentinel, biopsy, Left axillary #2 - ONE BENIGN LYMPH NODE (0/1). 1 of 4 FINAL for AMANAT, HACKEL ([id number]) Diagnosis(continued) 3. Lymph node, sentinel, biopsy, Left axillary #3 - METASTATIC CARCINOMA IN ONE LYMPH NODE (1/1). 4. Lymph node, sentinel, biopsy, Left axillary #4 - MICROMETASTATIC CARCINOMA IN ONE LYMPH NODE (1/1). 5. Lymph node, sentinel, biopsy, Left axillary #5 - METASTATIC CARCINOMA IN ONE LYMPH NODE (1/1). 6. Breast, partial mastectomy, Left - INVASIVE DUCTAL CARCINOMA, 1.8 CM. - DUCTAL CARCINOMA IN SITU. - CLOSEST MARGIN INFERIOR AT LESS THAN 0.1 CM. - LYMPHATIC VASCULAR INVASION PRESENT. 7. Lymph nodes, regional resection, Axillary left content - METASTATIC CARCINOMA IN EIGHT OF TWENTY-TWO LYMPH NODES (8/22).   ASSESSMENT AND PLAN:   Breast cancer of upper-outer quadrant of left female breast Hematoma aspirated.  No evidence of infection.  Follow up next week.  Get her set up for a port.      Milus Height, MD Surgical Oncology, [facility]  No PCP Per Patient No ref. provider found

## 2014-06-23 NOTE — Addendum Note (Signed)
Addended by: Renford Dills on: 06/23/2014 12:00 PM   Modules accepted: Orders

## 2014-06-23 NOTE — Assessment & Plan Note (Signed)
Hematoma aspirated.  No evidence of infection.  Follow up next week.  Get her set up for a port.

## 2014-06-24 ENCOUNTER — Telehealth: Payer: Self-pay | Admitting: Nurse Practitioner

## 2014-06-24 NOTE — Telephone Encounter (Signed)
, °

## 2014-06-25 ENCOUNTER — Telehealth (INDEPENDENT_AMBULATORY_CARE_PROVIDER_SITE_OTHER): Payer: Self-pay

## 2014-06-25 NOTE — Telephone Encounter (Signed)
Pt called in stating she had fluid aspirated on Monday by Dr Barry Dienes. She states the fluid has come back. Offered appointment to come in today to have aspirated. She asked if fluid was harmful to her and let her know the body will eventually absorb the fluid but it takes time. She has a scheduled appointment on 9/8 to see Dr Barry Dienes and would rather wait until then. Advised she needs to call for sooner appointment if it becomes red, swollen or she has fevers. Advised if the fluid build up becomes painful to also call for sooner appt. Will let Dr Barry Dienes know and see if she would like to see pt sooner than scheduled appt.

## 2014-06-26 ENCOUNTER — Telehealth (INDEPENDENT_AMBULATORY_CARE_PROVIDER_SITE_OTHER): Payer: Self-pay

## 2014-06-26 ENCOUNTER — Emergency Department (HOSPITAL_COMMUNITY)
Admission: EM | Admit: 2014-06-26 | Discharge: 2014-06-27 | Disposition: A | Discharge status: Home / Self Care | Payer: Medicaid Other | Attending: Emergency Medicine | Admitting: Emergency Medicine

## 2014-06-26 ENCOUNTER — Telehealth: Payer: Self-pay | Admitting: *Deleted

## 2014-06-26 ENCOUNTER — Encounter (HOSPITAL_COMMUNITY): Payer: Self-pay | Admitting: Emergency Medicine

## 2014-06-26 DIAGNOSIS — M129 Arthropathy, unspecified: Secondary | ICD-10-CM | POA: Diagnosis not present

## 2014-06-26 DIAGNOSIS — Z8659 Personal history of other mental and behavioral disorders: Secondary | ICD-10-CM | POA: Insufficient documentation

## 2014-06-26 DIAGNOSIS — R42 Dizziness and giddiness: Secondary | ICD-10-CM | POA: Insufficient documentation

## 2014-06-26 DIAGNOSIS — Z853 Personal history of malignant neoplasm of breast: Secondary | ICD-10-CM | POA: Diagnosis not present

## 2014-06-26 DIAGNOSIS — F17200 Nicotine dependence, unspecified, uncomplicated: Secondary | ICD-10-CM | POA: Diagnosis not present

## 2014-06-26 DIAGNOSIS — Z79899 Other long term (current) drug therapy: Secondary | ICD-10-CM | POA: Diagnosis not present

## 2014-06-26 DIAGNOSIS — R51 Headache: Secondary | ICD-10-CM | POA: Insufficient documentation

## 2014-06-26 DIAGNOSIS — L7682 Other postprocedural complications of skin and subcutaneous tissue: Secondary | ICD-10-CM

## 2014-06-26 DIAGNOSIS — D6489 Other specified anemias: Secondary | ICD-10-CM | POA: Diagnosis not present

## 2014-06-26 DIAGNOSIS — Y838 Other surgical procedures as the cause of abnormal reaction of the patient, or of later complication, without mention of misadventure at the time of the procedure: Secondary | ICD-10-CM | POA: Insufficient documentation

## 2014-06-26 DIAGNOSIS — IMO0002 Reserved for concepts with insufficient information to code with codable children: Secondary | ICD-10-CM | POA: Diagnosis not present

## 2014-06-26 LAB — CBC WITH DIFFERENTIAL/PLATELET
BASOS ABS: 0 10*3/uL (ref 0.0–0.1)
Basophils Relative: 0 % (ref 0–1)
EOS ABS: 0.2 10*3/uL (ref 0.0–0.7)
Eosinophils Relative: 2 % (ref 0–5)
HCT: 27.1 % — ABNORMAL LOW (ref 36.0–46.0)
HEMOGLOBIN: 8.9 g/dL — AB (ref 12.0–15.0)
LYMPHS ABS: 2.7 10*3/uL (ref 0.7–4.0)
Lymphocytes Relative: 30 % (ref 12–46)
MCH: 29.9 pg (ref 26.0–34.0)
MCHC: 32.8 g/dL (ref 30.0–36.0)
MCV: 90.9 fL (ref 78.0–100.0)
MONOS PCT: 7 % (ref 3–12)
Monocytes Absolute: 0.6 10*3/uL (ref 0.1–1.0)
Neutro Abs: 5.7 10*3/uL (ref 1.7–7.7)
Neutrophils Relative %: 61 % (ref 43–77)
Platelets: 399 10*3/uL (ref 150–400)
RBC: 2.98 MIL/uL — ABNORMAL LOW (ref 3.87–5.11)
RDW: 14.1 % (ref 11.5–15.5)
WBC: 9.3 10*3/uL (ref 4.0–10.5)

## 2014-06-26 MED ORDER — HYDROCODONE-ACETAMINOPHEN 5-325 MG PO TABS
1.0000 | ORAL_TABLET | Freq: Once | ORAL | Status: DC
  Filled 2014-06-26: qty 1

## 2014-06-26 NOTE — Telephone Encounter (Signed)
Received call from [place] at [facility] that the scheduling department has been trying to reach patient to schedule port placement. The phone number they have on file is the number for the [place]. Patient has no other numbers. This message will be given to Dr. [name].

## 2014-06-26 NOTE — ED Notes (Addendum)
Initial Contact - pt A+Ox4, c/o pain/swelling to L breast s/p lumpectomy and lymph node removal.  Pt reports was seen in surgeon's office x4 days ago for same complaint and had fluid drained from breast.  Pt has JP drain to L axilla and reports minimal amts drainage from drain.  Serosang fluid noted in bulb at this time.  Pt denies fevers/chills.  Reports feeling generally unwell and not herself.  L breast with significant swelling noted, tender.  Skin otherwise PWD.  MAEI.  Speaking full/clear sentences.  NAD.

## 2014-06-26 NOTE — ED Provider Notes (Signed)
CSN: [id number]     Arrival date & time 06/26/14  1936 History   First MD Initiated Contact with Patient 06/26/14 2301     Chief Complaint  Patient presents with  . surgical complication     Left Breast lumpectomy with lymphnode removal, swelling and leaking around JP site.    (Consider location/radiation/quality/duration/timing/severity/associated sxs/prior Treatment) HPI Laura Matthews is 48 yo female with PMH: breast CA, presenting to the ED with left breast swelling and pain.  Pt had breast surgery 16 days ago for removal of malignant mass.  3 days ago pt, began having swelling and tenderness and was seen in surgeon's office, where fluid was drained.  The swelling has not improved and the discomfort is worse which led to the pt coming the the ED tonight.  The pt still has a JP drain and has pain at the insertion site.  Pt denies, fever, chills, nausea, or vomiting.    Past Medical History  Diagnosis Date  . Mental disorder     depression-  . Depression   . Arthritis   . Hot flashes   . Head ache   . Malignant neoplasm of breast (female), unspecified site   . Family history of malignant neoplasm of breast    Past Surgical History  Procedure Laterality Date  . Knee surgery  1995    right  . Foot surgery  2011    rt  . Tubal ligation    . Uterine fibroid surgery  2005    ablation  . Breast lumpectomy with sentinel lymph node biopsy Left 06/11/2014    Procedure: BREAST LUMPECTOMY WITH SENTINEL LYMPH NODE BX, POSSIBLE AXILLARY LYMPH NODE DISSECTION;  Surgeon: Stark Klein, MD;  Location: [facility];  Service: General;  Laterality: Left;   Family History  Problem Relation Age of Onset  . Breast cancer Mother 78    currently 70; TAH/BSO d/t ?cervical ca at 68  . Breast cancer Maternal Aunt     dx 24s; currently in her late 44s  . Lung cancer Father     deceased 66   History  Substance Use Topics  . Smoking status: Current Every Day Smoker -- 0.25 packs/day    Types: Cigarettes  . Smokeless tobacco: Never Used  . Alcohol Use: No     Comment: Percoset,Vicodin, Oxycotin   OB History   Grav Para Term Preterm Abortions TAB SAB Ect Mult Living   3 3 3       3      Review of Systems  Constitutional: Negative for fever and chills.  HENT: Negative for sore throat.   Eyes: Negative for visual disturbance.  Respiratory: Negative for cough and shortness of breath.   Cardiovascular: Negative for chest pain and leg swelling.  Gastrointestinal: Negative for nausea, vomiting and diarrhea.  Genitourinary: Negative for dysuria.  Musculoskeletal: Negative for myalgias and neck pain.  Skin: Negative for rash.  Neurological: Negative for weakness, numbness and headaches.      Allergies  Norco  Home Medications   Prior to Admission medications   Medication Sig Start Date End Date Taking? Authorizing Provider  ibuprofen (ADVIL,MOTRIN) 200 MG tablet Take 400 mg by mouth every 6 (six) hours as needed for moderate pain.   Yes Historical Provider, MD  ondansetron (ZOFRAN ODT) 8 MG disintegrating tablet Take 1 tablet (8 mg total) by mouth every 8 (eight) hours as needed for nausea or vomiting. 06/03/14  Yes Hope Bunnie Pion, NP  oxyCODONE-acetaminophen (ROXICET)  5-325 MG per tablet Take 1-2 tablets by mouth every 4 (four) hours as needed for severe pain. 06/23/14  Yes Stark Klein, MD  ranitidine (ZANTAC) 150 MG tablet Take 1 tablet (150 mg total) by mouth 2 (two) times daily. 06/03/14  Yes Hope Bunnie Pion, NP   BP 139/79  Pulse 70  Temp(Src) 98.5 F (36.9 C) (Oral)  Resp 18  SpO2 100% Physical Exam  Nursing note and vitals reviewed. Constitutional: She is oriented to person, place, and time. She appears well-developed and well-nourished. No distress.  HENT:  Head: Normocephalic and atraumatic.  Mouth/Throat: Oropharynx is clear and moist. No oropharyngeal exudate.  Eyes: Conjunctivae are normal.  Neck: Neck supple. No thyromegaly present.  Cardiovascular:  Normal rate, regular rhythm and intact distal pulses.   Pulmonary/Chest: Effort normal and breath sounds normal. No respiratory distress. She has no wheezes. She has no rales. She exhibits no tenderness. Left breast exhibits skin change and tenderness. Left breast exhibits no inverted nipple, no mass and no nipple discharge. Breasts are asymmetrical.    Left breast is significantly lager than the right, JP drain in place, draining serosanguinous fluid, 30 ml drained.  Small are of redness noted to around JP insertion site.   Abdominal: Soft. There is no tenderness.  Musculoskeletal: She exhibits no tenderness.  Lymphadenopathy:    She has no cervical adenopathy.  Neurological: She is alert and oriented to person, place, and time. Coordination normal.  Skin: Skin is warm and dry. No rash noted. She is not diaphoretic. There is erythema.  Small are of redness noted to around JP insertion site.   Psychiatric: She has a normal mood and affect.    ED Course  Procedures (including critical care time) Labs Review Labs Reviewed  CBC WITH DIFFERENTIAL - Abnormal; Notable for the following:    RBC 2.98 (*)    Hemoglobin 8.9 (*)    HCT 27.1 (*)    All other components within normal limits   Imaging Review No results found.   EKG Interpretation None      MDM   Final diagnoses:  Other postoperative complication of subcutaneous tissue  Seroma  Anemia due to other cause   PT is non-toxic, appearing despite large, swollen breast after breast surgery.  JP drain emptied, and recharged without significant drainage of additional fluids.  Pain meds and CBC ordered, pt's hgb has decreased since prior to surgery, but does not need to be treated today, made pt aware and discussed need to follow-up with her surgeon. Surgeon will call in the morning to set up an appointment to drain the breast in the office tomorrow.  Return precautions provided, including fever, chills, purulent drainage from the  wound, worsening or severe pain, light-headedness, dizziness or passing out.  Case discussed with Dr. Sabra Heck.  Filed Vitals:   06/26/14 2347  BP: 139/79  Pulse: 70  Temp: 98.5 F (36.9 C)  Resp: 18   Meds given in ED:  Medications  lidocaine-EPINEPHrine (XYLOCAINE W/EPI) 2 %-1:100000 (with pres) injection 20 mL (not administered)  ketorolac (TORADOL) 30 MG/ML injection 30 mg (30 mg Intravenous Given 06/27/14 0006)    Discharge Medication List as of 06/27/2014  1:26 AM          Britt Bottom, NP 06/27/14 2979

## 2014-06-26 NOTE — Telephone Encounter (Signed)
Several unsuccessful attempts to reach this patient to schedule her PAC placement.  Made Dr. Virgie Dad nurse aware of same.

## 2014-06-26 NOTE — ED Notes (Signed)
Patient states she had a R lumpectomy with lymph node removal for breast CA on June 19, 2014. Patient states she has significant swelling, with leaking from her JP drain. Patient states she saw her surgeon 4 days ago and where they drained the area and sent her back home. Patient states that progressively she has increased swelling and now has leaking around the JP site again. Patient follows up with oncology on July 01, 2014.

## 2014-06-27 MED ORDER — KETOROLAC TROMETHAMINE 30 MG/ML IJ SOLN
30.0000 mg | Freq: Once | INTRAMUSCULAR | Status: AC
  Administered 2014-06-27: 30 mg via INTRAVENOUS
  Filled 2014-06-27: qty 1

## 2014-06-27 MED ORDER — KETOROLAC TROMETHAMINE 60 MG/2ML IM SOLN
60.0000 mg | Freq: Once | INTRAMUSCULAR | Status: DC
Start: 2014-06-27 — End: 2014-06-27

## 2014-06-27 MED ORDER — LIDOCAINE-EPINEPHRINE 2 %-1:100000 IJ SOLN
20.0000 mL | Freq: Once | INTRAMUSCULAR | Status: DC
  Filled 2014-06-27: qty 1

## 2014-06-27 NOTE — Progress Notes (Signed)
Pt discharged. Pt says she is going to cut her sutures to her jp drain when she gets home because she tired and frustrated with the physicians in the ER and her surgeon because she she feels no one is helping her and she feels like more should be done. RN advised patient that pulling her JP drain would not be a good decision and may cause her harm. Instructed her to follow discharge instructions and follow up with her surgeon tomorrow as instructed.

## 2014-06-27 NOTE — Discharge Instructions (Signed)
Your breast surgeon will call you in the morning to make an appointment to drain your breast. Be sure to follow up with them.  Your lab test today was reassuring that your body is not currently fighting an infection, however your hemoglobin is lower than several weeks ago.  It does not need to be treated right now, but your surgeon may want to follow it to make sure it is getting better.    Do not hesitate to seek medical attention: If you have a fever higher than 100.4, You have worsening, severe pain, You have pus-like drainage noted from your wound, You have severe nausea and vomiting, You feel light-headed, dizzy or pass out

## 2014-06-27 NOTE — ED Provider Notes (Signed)
Pt has had recent lumpectomy of the L breast and has a JP drain that remains in the surgical wound.  She has had worsening swelling of the L breast and in th eoffice she describes a drainage procedure of a hematoma / seroma collection - she has ttp on exam and bedside US reveals large fluid collection in the breast tissue - she has no leukocytosis and no fever.  She does have new anemia - pt informed - I d/w Dr. Zella Richer the case and he requests that she f/u in the AM with her primary surgeon Dr. Barry Dienes for drainage in the office and recommends against drainage this evening.  Pt in agreement wth plan - no signs of infection includiong no erythema or induration of skin.  JP contains hemo-serous material. Medical screening examination/treatment/procedure(s) were conducted as a shared visit with non-physician practitioner(s) and myself.  I personally evaluated the patient during the encounter.  Clinical Impression: post operative complication, Anemia      Laura Acosta, MD 06/27/14 [phone]

## 2014-06-30 ENCOUNTER — Encounter (HOSPITAL_BASED_OUTPATIENT_CLINIC_OR_DEPARTMENT_OTHER): Payer: Self-pay | Admitting: *Deleted

## 2014-06-30 NOTE — Progress Notes (Signed)
Having a lot of pain and swelling-take tylenol

## 2014-07-01 ENCOUNTER — Ambulatory Visit (HOSPITAL_COMMUNITY): Admission: RE | Admit: 2014-07-01 | Payer: Medicaid Other | Source: Ambulatory Visit

## 2014-07-01 ENCOUNTER — Other Ambulatory Visit: Payer: Self-pay

## 2014-07-01 ENCOUNTER — Telehealth: Payer: Self-pay | Admitting: *Deleted

## 2014-07-01 NOTE — Telephone Encounter (Signed)
No additional note

## 2014-07-02 ENCOUNTER — Encounter (HOSPITAL_BASED_OUTPATIENT_CLINIC_OR_DEPARTMENT_OTHER): Payer: Self-pay | Admitting: Anesthesiology

## 2014-07-02 ENCOUNTER — Encounter (HOSPITAL_BASED_OUTPATIENT_CLINIC_OR_DEPARTMENT_OTHER): Payer: Medicaid Other | Admitting: Anesthesiology

## 2014-07-02 ENCOUNTER — Ambulatory Visit (HOSPITAL_COMMUNITY): Payer: Medicaid Other

## 2014-07-02 ENCOUNTER — Ambulatory Visit (HOSPITAL_BASED_OUTPATIENT_CLINIC_OR_DEPARTMENT_OTHER)
Admission: RE | Admit: 2014-07-02 | Discharge: 2014-07-02 | Disposition: A | Discharge status: Home / Self Care | Payer: Medicaid Other | Source: Ambulatory Visit | Attending: General Surgery | Admitting: General Surgery

## 2014-07-02 ENCOUNTER — Ambulatory Visit (HOSPITAL_BASED_OUTPATIENT_CLINIC_OR_DEPARTMENT_OTHER): Payer: Medicaid Other | Admitting: Anesthesiology

## 2014-07-02 ENCOUNTER — Encounter (HOSPITAL_BASED_OUTPATIENT_CLINIC_OR_DEPARTMENT_OTHER)
Admission: RE | Disposition: A | Discharge status: Home / Self Care | Payer: Self-pay | Source: Ambulatory Visit | Attending: General Surgery

## 2014-07-02 DIAGNOSIS — F3289 Other specified depressive episodes: Secondary | ICD-10-CM | POA: Insufficient documentation

## 2014-07-02 DIAGNOSIS — K219 Gastro-esophageal reflux disease without esophagitis: Secondary | ICD-10-CM | POA: Insufficient documentation

## 2014-07-02 DIAGNOSIS — Y838 Other surgical procedures as the cause of abnormal reaction of the patient, or of later complication, without mention of misadventure at the time of the procedure: Secondary | ICD-10-CM | POA: Insufficient documentation

## 2014-07-02 DIAGNOSIS — F17200 Nicotine dependence, unspecified, uncomplicated: Secondary | ICD-10-CM | POA: Diagnosis not present

## 2014-07-02 DIAGNOSIS — C773 Secondary and unspecified malignant neoplasm of axilla and upper limb lymph nodes: Secondary | ICD-10-CM | POA: Diagnosis not present

## 2014-07-02 DIAGNOSIS — M1990 Unspecified osteoarthritis, unspecified site: Secondary | ICD-10-CM | POA: Insufficient documentation

## 2014-07-02 DIAGNOSIS — IMO0002 Reserved for concepts with insufficient information to code with codable children: Secondary | ICD-10-CM | POA: Insufficient documentation

## 2014-07-02 DIAGNOSIS — R51 Headache: Secondary | ICD-10-CM | POA: Insufficient documentation

## 2014-07-02 DIAGNOSIS — F329 Major depressive disorder, single episode, unspecified: Secondary | ICD-10-CM | POA: Diagnosis not present

## 2014-07-02 DIAGNOSIS — C50419 Malignant neoplasm of upper-outer quadrant of unspecified female breast: Secondary | ICD-10-CM | POA: Diagnosis not present

## 2014-07-02 HISTORY — PX: PORTACATH PLACEMENT: SHX2246

## 2014-07-02 HISTORY — PX: INCISION AND DRAINAGE: SHX5863

## 2014-07-02 SURGERY — INSERTION, TUNNELED CENTRAL VENOUS DEVICE, WITH PORT
Anesthesia: General | Site: Breast

## 2014-07-02 MED ORDER — MIDAZOLAM HCL 2 MG/2ML IJ SOLN: 1.0000 mg | INTRAMUSCULAR | Status: DC | PRN

## 2014-07-02 MED ORDER — MEPERIDINE HCL 25 MG/ML IJ SOLN: 6.2500 mg | INTRAMUSCULAR | Status: DC | PRN

## 2014-07-02 MED ORDER — MIDAZOLAM HCL 2 MG/2ML IJ SOLN
INTRAMUSCULAR | Status: AC
  Filled 2014-07-02: qty 2

## 2014-07-02 MED ORDER — BUPIVACAINE-EPINEPHRINE (PF) 0.5% -1:200000 IJ SOLN
INTRAMUSCULAR | Status: AC
  Filled 2014-07-02: qty 30

## 2014-07-02 MED ORDER — PROPOFOL 10 MG/ML IV BOLUS
INTRAVENOUS | Status: AC
  Filled 2014-07-02: qty 20

## 2014-07-02 MED ORDER — PROMETHAZINE HCL 25 MG/ML IJ SOLN: 6.2500 mg | INTRAMUSCULAR | Status: DC | PRN

## 2014-07-02 MED ORDER — LACTATED RINGERS IV SOLN
INTRAVENOUS | Status: DC
  Administered 2014-07-02 (×2): via INTRAVENOUS

## 2014-07-02 MED ORDER — HEPARIN (PORCINE) IN NACL 2-0.9 UNIT/ML-% IJ SOLN
INTRAMUSCULAR | Status: AC
  Filled 2014-07-02: qty 500

## 2014-07-02 MED ORDER — ACETAMINOPHEN 325 MG PO TABS: 650.0000 mg | ORAL_TABLET | ORAL | Status: DC | PRN

## 2014-07-02 MED ORDER — SODIUM CHLORIDE 0.9 % IJ SOLN: 3.0000 mL | Freq: Two times a day (BID) | INTRAMUSCULAR | Status: DC

## 2014-07-02 MED ORDER — DEXAMETHASONE SODIUM PHOSPHATE 4 MG/ML IJ SOLN
INTRAMUSCULAR | Status: DC | PRN
  Administered 2014-07-02: 4 mg via INTRAVENOUS

## 2014-07-02 MED ORDER — FENTANYL CITRATE 0.05 MG/ML IJ SOLN
25.0000 ug | INTRAMUSCULAR | Status: DC | PRN
  Administered 2014-07-02: 50 ug via INTRAVENOUS

## 2014-07-02 MED ORDER — FENTANYL CITRATE 0.05 MG/ML IJ SOLN
INTRAMUSCULAR | Status: AC
  Filled 2014-07-02: qty 4

## 2014-07-02 MED ORDER — MIDAZOLAM HCL 2 MG/2ML IJ SOLN: 0.5000 mg | Freq: Once | INTRAMUSCULAR | Status: DC | PRN

## 2014-07-02 MED ORDER — OXYCODONE HCL 5 MG PO TABS: 5.0000 mg | ORAL_TABLET | ORAL | Status: DC | PRN

## 2014-07-02 MED ORDER — HEPARIN SOD (PORK) LOCK FLUSH 100 UNIT/ML IV SOLN
INTRAVENOUS | Status: DC | PRN
  Administered 2014-07-02: 500 [IU] via INTRAVENOUS

## 2014-07-02 MED ORDER — LIDOCAINE-EPINEPHRINE (PF) 1 %-1:200000 IJ SOLN
INTRAMUSCULAR | Status: AC
  Filled 2014-07-02: qty 10

## 2014-07-02 MED ORDER — ACETAMINOPHEN 650 MG RE SUPP: 650.0000 mg | RECTAL | Status: DC | PRN

## 2014-07-02 MED ORDER — OXYCODONE-ACETAMINOPHEN 5-325 MG PO TABS: 1.0000 | ORAL_TABLET | ORAL | Status: DC | PRN

## 2014-07-02 MED ORDER — MIDAZOLAM HCL 5 MG/5ML IJ SOLN
INTRAMUSCULAR | Status: DC | PRN
  Administered 2014-07-02: 2 mg via INTRAVENOUS

## 2014-07-02 MED ORDER — OXYCODONE HCL 5 MG PO TABS
5.0000 mg | ORAL_TABLET | Freq: Once | ORAL | Status: AC | PRN
  Administered 2014-07-02: 5 mg via ORAL

## 2014-07-02 MED ORDER — HEPARIN SOD (PORK) LOCK FLUSH 100 UNIT/ML IV SOLN
INTRAVENOUS | Status: AC
  Filled 2014-07-02: qty 5

## 2014-07-02 MED ORDER — HEPARIN (PORCINE) IN NACL 2-0.9 UNIT/ML-% IJ SOLN
INTRAMUSCULAR | Status: DC | PRN
  Administered 2014-07-02: 1 via INTRAVENOUS

## 2014-07-02 MED ORDER — BUPIVACAINE HCL (PF) 0.25 % IJ SOLN
INTRAMUSCULAR | Status: DC | PRN
  Administered 2014-07-02: 5 mL

## 2014-07-02 MED ORDER — LIDOCAINE-EPINEPHRINE (PF) 1 %-1:200000 IJ SOLN
INTRAMUSCULAR | Status: DC | PRN
  Administered 2014-07-02: 5 mL

## 2014-07-02 MED ORDER — ONDANSETRON HCL 4 MG/2ML IJ SOLN
INTRAMUSCULAR | Status: DC | PRN
  Administered 2014-07-02: 4 mg via INTRAVENOUS

## 2014-07-02 MED ORDER — PHENYLEPHRINE HCL 10 MG/ML IJ SOLN
INTRAMUSCULAR | Status: DC | PRN
  Administered 2014-07-02 (×2): 40 ug via INTRAVENOUS

## 2014-07-02 MED ORDER — SODIUM CHLORIDE 0.9 % IJ SOLN
3.0000 mL | INTRAMUSCULAR | Status: DC | PRN
Start: 2014-07-02 — End: 2014-07-02

## 2014-07-02 MED ORDER — PROPOFOL 10 MG/ML IV BOLUS
INTRAVENOUS | Status: DC | PRN
  Administered 2014-07-02: 200 mg via INTRAVENOUS

## 2014-07-02 MED ORDER — CEFAZOLIN SODIUM-DEXTROSE 2-3 GM-% IV SOLR
INTRAVENOUS | Status: AC
  Filled 2014-07-02: qty 50

## 2014-07-02 MED ORDER — CEFAZOLIN SODIUM-DEXTROSE 2-3 GM-% IV SOLR
2.0000 g | INTRAVENOUS | Status: AC
  Administered 2014-07-02: 2 g via INTRAVENOUS

## 2014-07-02 MED ORDER — OXYCODONE HCL 5 MG PO TABS
ORAL_TABLET | ORAL | Status: AC
  Filled 2014-07-02: qty 1

## 2014-07-02 MED ORDER — FENTANYL CITRATE 0.05 MG/ML IJ SOLN
INTRAMUSCULAR | Status: DC | PRN
  Administered 2014-07-02: 100 ug via INTRAVENOUS

## 2014-07-02 MED ORDER — BUPIVACAINE HCL (PF) 0.25 % IJ SOLN
INTRAMUSCULAR | Status: AC
  Filled 2014-07-02: qty 30

## 2014-07-02 MED ORDER — OXYCODONE HCL 5 MG/5ML PO SOLN: 5.0000 mg | Freq: Once | ORAL | Status: AC | PRN

## 2014-07-02 MED ORDER — FENTANYL CITRATE 0.05 MG/ML IJ SOLN
INTRAMUSCULAR | Status: AC
  Filled 2014-07-02: qty 2

## 2014-07-02 MED ORDER — SODIUM CHLORIDE 0.9 % IV SOLN: 250.0000 mL | INTRAVENOUS | Status: DC | PRN

## 2014-07-02 MED ORDER — FENTANYL CITRATE 0.05 MG/ML IJ SOLN: 50.0000 ug | INTRAMUSCULAR | Status: DC | PRN

## 2014-07-02 SURGICAL SUPPLY — 41 items
ADH SKN CLS APL DERMABOND .7 (GAUZE/BANDAGES/DRESSINGS) ×2
BAG DECANTER FOR FLEXI CONT (MISCELLANEOUS) ×4 IMPLANT
BLADE HEX COATED 2.75 (ELECTRODE) ×4 IMPLANT
BLADE SURG 11 STRL SS (BLADE) ×4 IMPLANT
BLADE SURG 15 STRL LF DISP TIS (BLADE) ×2 IMPLANT
BLADE SURG 15 STRL SS (BLADE) ×4
CHLORAPREP W/TINT 26ML (MISCELLANEOUS) ×4 IMPLANT
COVER MAYO STAND STRL (DRAPES) ×4 IMPLANT
COVER TABLE BACK 60X90 (DRAPES) ×4 IMPLANT
DECANTER SPIKE VIAL GLASS SM (MISCELLANEOUS) IMPLANT
DERMABOND ADVANCED (GAUZE/BANDAGES/DRESSINGS) ×2
DERMABOND ADVANCED .7 DNX12 (GAUZE/BANDAGES/DRESSINGS) ×2 IMPLANT
DRAPE C-ARM 42X72 X-RAY (DRAPES) ×4 IMPLANT
DRAPE LAPAROTOMY TRNSV 102X78 (DRAPE) ×4 IMPLANT
DRAPE UTILITY XL STRL (DRAPES) ×4 IMPLANT
DRSG TEGADERM 4X4.75 (GAUZE/BANDAGES/DRESSINGS) IMPLANT
ELECT REM PT RETURN 9FT ADLT (ELECTROSURGICAL) ×4
ELECTRODE REM PT RTRN 9FT ADLT (ELECTROSURGICAL) ×2 IMPLANT
GLOVE BIO SURGEON STRL SZ 6 (GLOVE) ×4 IMPLANT
GLOVE BIOGEL PI IND STRL 6.5 (GLOVE) ×2 IMPLANT
GLOVE BIOGEL PI INDICATOR 6.5 (GLOVE) ×2
GOWN STRL REUS W/ TWL LRG LVL3 (GOWN DISPOSABLE) ×2 IMPLANT
GOWN STRL REUS W/TWL 2XL LVL3 (GOWN DISPOSABLE) ×4 IMPLANT
GOWN STRL REUS W/TWL LRG LVL3 (GOWN DISPOSABLE) ×4
KIT PORT POWER 8FR ISP CVUE (Catheter) ×2 IMPLANT
NDL HYPO 25X1 1.5 SAFETY (NEEDLE) ×2 IMPLANT
NEEDLE HYPO 25X1 1.5 SAFETY (NEEDLE) ×4 IMPLANT
PACK BASIN DAY SURGERY FS (CUSTOM PROCEDURE TRAY) ×4 IMPLANT
PENCIL BUTTON HOLSTER BLD 10FT (ELECTRODE) ×4 IMPLANT
SLEEVE SCD COMPRESS KNEE MED (MISCELLANEOUS) ×4 IMPLANT
SPONGE GAUZE 4X4 12PLY STER LF (GAUZE/BANDAGES/DRESSINGS) IMPLANT
SUT MNCRL AB 4-0 PS2 18 (SUTURE) ×4 IMPLANT
SUT PROLENE 2 0 SH DA (SUTURE) ×8 IMPLANT
SUT VIC AB 3-0 SH 27 (SUTURE) ×4
SUT VIC AB 3-0 SH 27X BRD (SUTURE) ×2 IMPLANT
SUT VICRYL 3-0 CR8 SH (SUTURE) IMPLANT
SYR 5ML LUER SLIP (SYRINGE) ×4 IMPLANT
SYR CONTROL 10ML LL (SYRINGE) ×4 IMPLANT
SYRINGE 10CC LL (SYRINGE) ×4 IMPLANT
TOWEL OR 17X24 6PK STRL BLUE (TOWEL DISPOSABLE) ×4 IMPLANT
TOWEL OR NON WOVEN STRL DISP B (DISPOSABLE) ×4 IMPLANT

## 2014-07-02 NOTE — Anesthesia Preprocedure Evaluation (Addendum)
Anesthesia Evaluation  Patient identified by MRN, date of birth, ID band Patient awake    Reviewed: Allergy & Precautions, H&P , NPO status , Patient's Chart, lab work & pertinent test results  History of Anesthesia Complications Negative for: history of anesthetic complications  Airway Mallampati: II TM Distance: >3 FB Neck ROM: Full    Dental  (+) Missing, Dental Advisory Given   Pulmonary Current Smoker,  breath sounds clear to auscultation        Cardiovascular - anginanegative cardio ROS  Rhythm:Regular Rate:Normal     Neuro/Psych  Headaches, Depression    GI/Hepatic Neg liver ROS, GERD-  Medicated and Controlled,  Endo/Other  negative endocrine ROS  Renal/GU negative Renal ROS     Musculoskeletal  (+) Arthritis -, Osteoarthritis,    Abdominal   Peds  Hematology  (+) Blood dyscrasia (Hb 8.9), ,   Anesthesia Other Findings Breast cancer: surgery, chemo  Reproductive/Obstetrics                         Anesthesia Physical Anesthesia Plan  ASA: III  Anesthesia Plan: General   Post-op Pain Management:    Induction: Intravenous  Airway Management Planned: LMA  Additional Equipment:   Intra-op Plan:   Post-operative Plan:   Informed Consent: I have reviewed the patients History and Physical, chart, labs and discussed the procedure including the risks, benefits and alternatives for the proposed anesthesia with the patient or authorized representative who has indicated his/her understanding and acceptance.   Dental advisory given  Plan Discussed with: CRNA and Surgeon  Anesthesia Plan Comments: (Plan routine monitors, GA- LMA OK)        Anesthesia Quick Evaluation

## 2014-07-02 NOTE — Anesthesia Procedure Notes (Signed)
Procedure Name: LMA Insertion Date/Time: 07/02/2014 9:58 AM Performed by: Maryella Shivers Pre-anesthesia Checklist: Patient identified, Emergency Drugs available, Suction available and Patient being monitored Patient Re-evaluated:Patient Re-evaluated prior to inductionOxygen Delivery Method: Circle System Utilized Preoxygenation: Pre-oxygenation with 100% oxygen Intubation Type: IV induction Ventilation: Mask ventilation without difficulty LMA: LMA inserted LMA Size: 4.0 Number of attempts: 1 Airway Equipment and Method: bite block Placement Confirmation: positive ETCO2 Tube secured with: Tape Dental Injury: Teeth and Oropharynx as per pre-operative assessment

## 2014-07-02 NOTE — Transfer of Care (Signed)
Immediate Anesthesia Transfer of Care Note  Patient: Laura Matthews  Procedure(s) Performed: Procedure(s): INSERTION PORT-A-CATH (N/A) Drainage left breast seroma (Left)  Patient Location: PACU  Anesthesia Type:General  Level of Consciousness: awake, alert  and oriented  Airway & Oxygen Therapy: Patient Spontanous Breathing and Patient connected to face mask oxygen  Post-op Assessment: Report given to PACU RN and Post -op Vital signs reviewed and stable  Post vital signs: Reviewed and stable  Complications: No apparent anesthesia complications

## 2014-07-02 NOTE — H&P (View-Only) (Signed)
HISTORY: Pt is around 2 weeks s/p left lumpectomy and sentinel lymph node biopsy.  She is having a lot of swelling and bruising on the left breast.  She is also having a bit of drainage around her drain.  Her drain output is 35-50/day.  She denies fever/chills.      EXAM: General:  Alert and oriented. Incision:  No evidence of infection.  Liquid hematoma   PATHOLOGY: Diagnosis 1. Lymph node, sentinel, biopsy, Left axillary #1 - METASTATIC CARCINOMA IN ONE LYMPH NODE (1/1). 2. Lymph node, sentinel, biopsy, Left axillary #2 - ONE BENIGN LYMPH NODE (0/1). 1 of 4 FINAL for Laura Matthews, Laura Matthews ([id number]) Diagnosis(continued) 3. Lymph node, sentinel, biopsy, Left axillary #3 - METASTATIC CARCINOMA IN ONE LYMPH NODE (1/1). 4. Lymph node, sentinel, biopsy, Left axillary #4 - MICROMETASTATIC CARCINOMA IN ONE LYMPH NODE (1/1). 5. Lymph node, sentinel, biopsy, Left axillary #5 - METASTATIC CARCINOMA IN ONE LYMPH NODE (1/1). 6. Breast, partial mastectomy, Left - INVASIVE DUCTAL CARCINOMA, 1.8 CM. - DUCTAL CARCINOMA IN SITU. - CLOSEST MARGIN INFERIOR AT LESS THAN 0.1 CM. - LYMPHATIC VASCULAR INVASION PRESENT. 7. Lymph nodes, regional resection, Axillary left content - METASTATIC CARCINOMA IN EIGHT OF TWENTY-TWO LYMPH NODES (8/22).   ASSESSMENT AND PLAN:   Breast cancer of upper-outer quadrant of left female breast Hematoma aspirated.  No evidence of infection.  Follow up next week.  Get her set up for a port.      Milus Height, MD Surgical Oncology, [facility]  No PCP Per Patient No ref. provider found

## 2014-07-02 NOTE — Op Note (Signed)
PREOPERATIVE DIAGNOSIS:  Left breast cancer     POSTOPERATIVE DIAGNOSIS:  Same     PROCEDURE: left subclavian port placement, Bard ClearVue  Power Port, MRI safe, 8-French, aspiration of left breast hematoma     SURGEON:  Stark Klein, MD      ANESTHESIA:  General   FINDINGS:  Old blood in breast.  Good venous return, easy flush, and tip of the catheter and   SVC 22 cm.      SPECIMEN:  None.      ESTIMATED BLOOD LOSS:  Minimal.      COMPLICATIONS:  None known.      PROCEDURE:  Pt was identified in the holding area and taken to   the operating room, where patient was placed supine on the operating room   table.  General LMA anesthesia was induced.  Patient's arms were tucked.  The timeout was performed.  The left breast was cleaned, and a 14 g angiocath was used to aspirate the hematoma.  An 18 g blunt needle was then used to manipulate the direction of the syringe.  A total of 250 mL was aspirated.    The upper chest and neck were prepped and draped in sterile fashion.  Time-out was   performed according to the surgical safety check list.  When all was   correct, we continued.   Local anesthetic was administered over this   area at the angle of the clavicle.  The vein was accessed with 1 pass of the needle. There was good venous return and the wire passed easily with no ectopy.   Fluoroscopy was used to confirm that the wire was in the vena cava.      The patient was placed back level and the area for the pocket was anethetized   with local anesthetic.  A 3-cm transverse incision was made with a #15   blade.  Cautery was used to divide the subcutaneous tissues down to the   pectoralis muscle.  An [employer] retractor was used to elevate the skin   while a pocket was created on top of the pectoralis fascia.  The port   was placed into the pocket to confirm that it was of adequate size.  The   catheter was preattached to the port.  The port was then secured to the   pectoralis  fascia with four 2-0 Prolene sutures.  These were clamped and   not tied down yet.    The catheter was tunneled through to the wire exit   site.  The catheter was placed along the wire to determine what length it should be to be in the SVC.  The catheter was cut at 22 cm.  The tunneler sheath and dilator were passed over the wire and the dilator and wire were removed.  The catheter was advanced through the tunneler sheath and the tunneler sheath was pulled away.  Care was taken to keep the catheter in the tunneler sheath as this occurred. This was advanced and the tunneler sheath was removed.  There was good venous   return and easy flush of the catheter.  The Prolene sutures were tied   down to the pectoral fascia.  The skin was reapproximated using 3-0   Vicryl interrupted deep dermal sutures.    Fluoroscopy was used to re-confirm good position of the catheter.  The skin   was then closed using 4-0 Monocryl in a subcuticular fashion.  The port was flushed with concentrated heparin  flush as well.  The wounds were then cleaned, dried, and dressed with Dermabond.  The patient was awakened from anesthesia and taken to the PACU in stable condition.  Needle, sponge, and instrument counts were correct.               Stark Klein, MD

## 2014-07-02 NOTE — Anesthesia Postprocedure Evaluation (Signed)
  Anesthesia Post-op Note  Patient: Laura Matthews  Procedure(s) Performed: Procedure(s): INSERTION PORT-A-CATH (N/A) Drainage left breast seroma (Left)  Patient Location: PACU  Anesthesia Type:General  Level of Consciousness: awake, alert , oriented and patient cooperative  Airway and Oxygen Therapy: Patient Spontanous Breathing  Post-op Pain: mild  Post-op Assessment: Post-op Vital signs reviewed, Patient's Cardiovascular Status Stable, Respiratory Function Stable, Patent Airway, No signs of Nausea or vomiting and Pain level controlled  Post-op Vital Signs: Reviewed and stable  Last Vitals:  Filed Vitals:   07/02/14 1145  BP: 133/75  Pulse: 73  Temp: 36.6 C  Resp: 20    Complications: No apparent anesthesia complications

## 2014-07-02 NOTE — Interval H&P Note (Signed)
History and Physical Interval Note:  07/02/2014 9:19 AM  Laura Matthews  has presented today for surgery, with the diagnosis of left breast cancer  The various methods of treatment have been discussed with the patient and family. After consideration of risks, benefits and other options for treatment, the patient has consented to  Procedure(s): INSERTION PORT-A-CATH (N/A) and aspiration of left breast seroma as a surgical intervention .  Her drain output is still too high to remove.  The patient's history has been reviewed, patient examined, no change in status, stable for surgery.  I have reviewed the patient's chart and labs.  Questions were answered to the patient's satisfaction.     Eyoel Throgmorton

## 2014-07-02 NOTE — Discharge Instructions (Addendum)
[facility] Office Phone Number [phone]   POST OP INSTRUCTIONS  Always review your discharge instruction sheet given to you by the facility where your surgery was performed.  IF YOU HAVE DISABILITY OR FAMILY LEAVE FORMS, YOU MUST BRING THEM TO THE OFFICE FOR PROCESSING.  DO NOT GIVE THEM TO YOUR DOCTOR.  1. A prescription for pain medication may be given to you upon discharge.  Take your pain medication as prescribed, if needed.  If narcotic pain medicine is not needed, then you may take acetaminophen (Tylenol) or ibuprofen (Advil) as needed. 2. Take your usually prescribed medications unless otherwise directed 3. If you need a refill on your pain medication, please contact your pharmacy.  They will contact our office to request authorization.  Prescriptions will not be filled after 5pm or on week-ends. 4. You should eat very light the first 24 hours after surgery, such as soup, crackers, pudding, etc.  Resume your normal diet the day after surgery 5. It is common to experience some constipation if taking pain medication after surgery.  Increasing fluid intake and taking a stool softener will usually help or prevent this problem from occurring.  A mild laxative (Milk of Magnesia or Miralax) should be taken according to package directions if there are no bowel movements after 48 hours. 6. You may shower in 48 hours.  The surgical glue will flake off in 2-3 weeks.   7. ACTIVITIES:  No strenuous activity or heavy lifting for 1 week.   a. You may drive when you no longer are taking prescription pain medication, you can comfortably wear a seatbelt, and you can safely maneuver your car and apply brakes. b. RETURN TO WORK:  __________n/a_______________ Dennis Bast should see your doctor in the office for a follow-up appointment approximately three-four weeks after your surgery.    WHEN TO CALL YOUR DOCTOR: 1. Fever over 101.0 2. Nausea and/or vomiting. 3. Extreme swelling or  bruising. 4. Continued bleeding from incision. 5. Increased pain, redness, or drainage from the incision.  The clinic staff is available to answer your questions during regular business hours.  Please dont hesitate to call and ask to speak to one of the nurses for clinical concerns.  If you have a medical emergency, go to the nearest emergency room or call 911.  A surgeon from [facility] is always on call at the hospital.  For further questions, please visit [email or web address]    Post Anesthesia Home Care Instructions  Activity: Get plenty of rest for the remainder of the day. A responsible adult should stay with you for 24 hours following the procedure.  For the next 24 hours, DO NOT: -Drive a car -Paediatric nurse -Drink alcoholic beverages -Take any medication unless instructed by your physician -Make any legal decisions or sign important papers.  Meals: Start with liquid foods such as gelatin or soup. Progress to regular foods as tolerated. Avoid greasy, spicy, heavy foods. If nausea and/or vomiting occur, drink only clear liquids until the nausea and/or vomiting subsides. Call your physician if vomiting continues.  Special Instructions/Symptoms: Your throat may feel dry or sore from the anesthesia or the breathing tube placed in your throat during surgery. If this causes discomfort, gargle with warm salt water. The discomfort should disappear within 24 hours.

## 2014-07-03 ENCOUNTER — Encounter (HOSPITAL_BASED_OUTPATIENT_CLINIC_OR_DEPARTMENT_OTHER): Payer: Self-pay | Admitting: General Surgery

## 2014-07-04 ENCOUNTER — Encounter: Payer: Self-pay | Admitting: *Deleted

## 2014-07-04 ENCOUNTER — Ambulatory Visit (HOSPITAL_COMMUNITY)
Admission: RE | Admit: 2014-07-04 | Discharge: 2014-07-04 | Disposition: A | Discharge status: Home / Self Care | Payer: Medicaid Other | Source: Ambulatory Visit | Attending: Cardiology | Admitting: Cardiology

## 2014-07-04 DIAGNOSIS — C50919 Malignant neoplasm of unspecified site of unspecified female breast: Secondary | ICD-10-CM | POA: Insufficient documentation

## 2014-07-04 DIAGNOSIS — C50912 Malignant neoplasm of unspecified site of left female breast: Secondary | ICD-10-CM

## 2014-07-04 NOTE — Progress Notes (Signed)
[facility] Work  Clinical Social Work approached by pt at her request to fill out form for HUD certifying disability. CSW inquired more about the form and who provided it, in order to assist with completion. CSW has not seen this form before. Pt reported her counselor provided it, but they were not available today or Monday, but she needed said form completed today. CSW explained to pt that the form may need to be completed by a medical provider, but CSW was unsure. Pt upset, saying she needs the form today. CSW explained she was trying to find out how to best assist her and that she may need to talk to RN at her doctor's office. Pt then stomped off and would not respond.  Clinical Social Work interventions: Resource assistance  Loren Racer, [place] Worker [place]  [place] Phone: [phone] Fax: [phone]

## 2014-07-04 NOTE — Progress Notes (Signed)
  Echocardiogram 2D Echocardiogram has been performed.  Donata Clay 07/04/2014, 4:03 PM

## 2014-07-08 ENCOUNTER — Encounter (INDEPENDENT_AMBULATORY_CARE_PROVIDER_SITE_OTHER): Payer: Medicaid Other | Admitting: General Surgery

## 2014-07-08 ENCOUNTER — Other Ambulatory Visit (INDEPENDENT_AMBULATORY_CARE_PROVIDER_SITE_OTHER): Payer: Self-pay | Admitting: General Surgery

## 2014-07-09 ENCOUNTER — Telehealth: Payer: Self-pay | Admitting: *Deleted

## 2014-07-09 ENCOUNTER — Ambulatory Visit: Payer: Self-pay | Admitting: Nurse Practitioner

## 2014-07-09 ENCOUNTER — Other Ambulatory Visit: Payer: Self-pay

## 2014-07-09 ENCOUNTER — Encounter (HOSPITAL_COMMUNITY): Payer: Self-pay

## 2014-07-09 ENCOUNTER — Encounter (HOSPITAL_COMMUNITY): Payer: Self-pay | Admitting: *Deleted

## 2014-07-09 MED ORDER — CEFAZOLIN SODIUM-DEXTROSE 2-3 GM-% IV SOLR
2.0000 g | INTRAVENOUS | Status: AC
  Administered 2014-07-10: 2 g via INTRAVENOUS
  Filled 2014-07-09: qty 50

## 2014-07-09 NOTE — Telephone Encounter (Signed)
Will let Dr. Virgie Dad nurse know about phone conversation with pt.

## 2014-07-10 ENCOUNTER — Ambulatory Visit (HOSPITAL_COMMUNITY): Payer: Medicaid Other | Admitting: Anesthesiology

## 2014-07-10 ENCOUNTER — Encounter (HOSPITAL_COMMUNITY): Payer: Medicaid Other | Admitting: Anesthesiology

## 2014-07-10 ENCOUNTER — Encounter (HOSPITAL_COMMUNITY): Payer: Self-pay | Admitting: *Deleted

## 2014-07-10 ENCOUNTER — Encounter (HOSPITAL_COMMUNITY)
Admission: RE | Disposition: A | Discharge status: Home / Self Care | Payer: Self-pay | Source: Ambulatory Visit | Attending: General Surgery

## 2014-07-10 ENCOUNTER — Ambulatory Visit (HOSPITAL_COMMUNITY)
Admission: RE | Admit: 2014-07-10 | Discharge: 2014-07-10 | Disposition: A | Discharge status: Home / Self Care | Payer: Medicaid Other | Source: Ambulatory Visit | Attending: General Surgery | Admitting: General Surgery

## 2014-07-10 DIAGNOSIS — F329 Major depressive disorder, single episode, unspecified: Secondary | ICD-10-CM | POA: Insufficient documentation

## 2014-07-10 DIAGNOSIS — M1990 Unspecified osteoarthritis, unspecified site: Secondary | ICD-10-CM | POA: Diagnosis not present

## 2014-07-10 DIAGNOSIS — K219 Gastro-esophageal reflux disease without esophagitis: Secondary | ICD-10-CM | POA: Diagnosis not present

## 2014-07-10 DIAGNOSIS — Y838 Other surgical procedures as the cause of abnormal reaction of the patient, or of later complication, without mention of misadventure at the time of the procedure: Secondary | ICD-10-CM | POA: Diagnosis not present

## 2014-07-10 DIAGNOSIS — Z9221 Personal history of antineoplastic chemotherapy: Secondary | ICD-10-CM | POA: Diagnosis not present

## 2014-07-10 DIAGNOSIS — F3289 Other specified depressive episodes: Secondary | ICD-10-CM | POA: Diagnosis not present

## 2014-07-10 DIAGNOSIS — C50919 Malignant neoplasm of unspecified site of unspecified female breast: Secondary | ICD-10-CM | POA: Diagnosis present

## 2014-07-10 DIAGNOSIS — IMO0002 Reserved for concepts with insufficient information to code with codable children: Secondary | ICD-10-CM | POA: Insufficient documentation

## 2014-07-10 DIAGNOSIS — D649 Anemia, unspecified: Secondary | ICD-10-CM | POA: Diagnosis not present

## 2014-07-10 DIAGNOSIS — C773 Secondary and unspecified malignant neoplasm of axilla and upper limb lymph nodes: Secondary | ICD-10-CM | POA: Diagnosis not present

## 2014-07-10 DIAGNOSIS — F17200 Nicotine dependence, unspecified, uncomplicated: Secondary | ICD-10-CM | POA: Diagnosis not present

## 2014-07-10 HISTORY — PX: EVACUATION BREAST HEMATOMA: SHX1537

## 2014-07-10 HISTORY — DX: Anemia, unspecified: D64.9

## 2014-07-10 LAB — CBC
HCT: 31.2 % — ABNORMAL LOW (ref 36.0–46.0)
Hemoglobin: 10.3 g/dL — ABNORMAL LOW (ref 12.0–15.0)
MCH: 29.6 pg (ref 26.0–34.0)
MCHC: 33 g/dL (ref 30.0–36.0)
MCV: 89.7 fL (ref 78.0–100.0)
PLATELETS: 347 10*3/uL (ref 150–400)
RBC: 3.48 MIL/uL — ABNORMAL LOW (ref 3.87–5.11)
RDW: 13.9 % (ref 11.5–15.5)
WBC: 7.8 10*3/uL (ref 4.0–10.5)

## 2014-07-10 SURGERY — EVACUATION, HEMATOMA, BREAST
Anesthesia: General | Laterality: Left

## 2014-07-10 MED ORDER — PROMETHAZINE HCL 25 MG/ML IJ SOLN: 6.2500 mg | INTRAMUSCULAR | Status: DC | PRN

## 2014-07-10 MED ORDER — OXYCODONE HCL 5 MG PO TABS: 15.0000 mg | ORAL_TABLET | ORAL | Status: DC | PRN

## 2014-07-10 MED ORDER — ROCURONIUM BROMIDE 50 MG/5ML IV SOLN
INTRAVENOUS | Status: AC
  Filled 2014-07-10: qty 1

## 2014-07-10 MED ORDER — PROPOFOL 10 MG/ML IV BOLUS
INTRAVENOUS | Status: DC | PRN
  Administered 2014-07-10: 150 mg via INTRAVENOUS

## 2014-07-10 MED ORDER — LACTATED RINGERS IV SOLN
INTRAVENOUS | Status: DC | PRN
  Administered 2014-07-10: 15:00:00 via INTRAVENOUS

## 2014-07-10 MED ORDER — 0.9 % SODIUM CHLORIDE (POUR BTL) OPTIME
TOPICAL | Status: DC | PRN
  Administered 2014-07-10: 1000 mL

## 2014-07-10 MED ORDER — FENTANYL CITRATE 0.05 MG/ML IJ SOLN
INTRAMUSCULAR | Status: DC | PRN
  Administered 2014-07-10 (×4): 50 ug via INTRAVENOUS

## 2014-07-10 MED ORDER — LIDOCAINE HCL (PF) 1 % IJ SOLN: INTRAMUSCULAR | Status: DC | PRN

## 2014-07-10 MED ORDER — MIDAZOLAM HCL 5 MG/5ML IJ SOLN
INTRAMUSCULAR | Status: DC | PRN
  Administered 2014-07-10: 2 mg via INTRAVENOUS

## 2014-07-10 MED ORDER — LIDOCAINE HCL (CARDIAC) 20 MG/ML IV SOLN
INTRAVENOUS | Status: AC
  Filled 2014-07-10: qty 5

## 2014-07-10 MED ORDER — HYDROMORPHONE HCL PF 1 MG/ML IJ SOLN
0.2500 mg | INTRAMUSCULAR | Status: DC | PRN
  Administered 2014-07-10: 0.5 mg via INTRAVENOUS

## 2014-07-10 MED ORDER — PROPOFOL 10 MG/ML IV BOLUS
INTRAVENOUS | Status: AC
  Filled 2014-07-10: qty 20

## 2014-07-10 MED ORDER — OXYCODONE HCL 5 MG/5ML PO SOLN: 5.0000 mg | Freq: Once | ORAL | Status: AC | PRN

## 2014-07-10 MED ORDER — OXYCODONE HCL 5 MG PO TABS
5.0000 mg | ORAL_TABLET | ORAL | Status: DC | PRN
  Filled 2014-07-10: qty 2

## 2014-07-10 MED ORDER — LIDOCAINE HCL 1 % IJ SOLN
INTRAMUSCULAR | Status: DC | PRN
  Administered 2014-07-10: 16:00:00 via INTRAMUSCULAR

## 2014-07-10 MED ORDER — HEMOSTATIC AGENTS (NO CHARGE) OPTIME
TOPICAL | Status: DC | PRN
  Administered 2014-07-10: 1 via TOPICAL

## 2014-07-10 MED ORDER — PROMETHAZINE HCL 25 MG/ML IJ SOLN
INTRAMUSCULAR | Status: AC
  Filled 2014-07-10: qty 1

## 2014-07-10 MED ORDER — DEXAMETHASONE SODIUM PHOSPHATE 10 MG/ML IJ SOLN
INTRAMUSCULAR | Status: DC | PRN
  Administered 2014-07-10: 4 mg via INTRAVENOUS

## 2014-07-10 MED ORDER — LIDOCAINE HCL (CARDIAC) 20 MG/ML IV SOLN
INTRAVENOUS | Status: DC | PRN
  Administered 2014-07-10: 80 mg via INTRAVENOUS

## 2014-07-10 MED ORDER — MIDAZOLAM HCL 2 MG/2ML IJ SOLN
INTRAMUSCULAR | Status: AC
  Filled 2014-07-10: qty 2

## 2014-07-10 MED ORDER — LACTATED RINGERS IV SOLN
INTRAVENOUS | Status: DC
  Administered 2014-07-10: 50 mL/h via INTRAVENOUS

## 2014-07-10 MED ORDER — ONDANSETRON HCL 4 MG/2ML IJ SOLN
INTRAMUSCULAR | Status: DC | PRN
  Administered 2014-07-10: 4 mg via INTRAVENOUS

## 2014-07-10 MED ORDER — HYDROMORPHONE HCL PF 1 MG/ML IJ SOLN
INTRAMUSCULAR | Status: AC
  Filled 2014-07-10: qty 1

## 2014-07-10 MED ORDER — OXYCODONE HCL 5 MG PO TABS
5.0000 mg | ORAL_TABLET | Freq: Once | ORAL | Status: AC | PRN
Start: 2014-07-10 — End: 2014-07-10
  Administered 2014-07-10: 5 mg via ORAL

## 2014-07-10 MED ORDER — CEPHALEXIN 500 MG PO CAPS: 500.0000 mg | ORAL_CAPSULE | Freq: Four times a day (QID) | ORAL | Status: DC

## 2014-07-10 MED ORDER — FENTANYL CITRATE 0.05 MG/ML IJ SOLN
INTRAMUSCULAR | Status: AC
  Filled 2014-07-10: qty 5

## 2014-07-10 MED ORDER — ONDANSETRON HCL 4 MG/2ML IJ SOLN
INTRAMUSCULAR | Status: AC
  Filled 2014-07-10: qty 2

## 2014-07-10 MED ORDER — OXYCODONE HCL 5 MG PO TABS
ORAL_TABLET | ORAL | Status: AC
  Filled 2014-07-10: qty 1

## 2014-07-10 MED ORDER — LIDOCAINE HCL (PF) 1 % IJ SOLN
INTRAMUSCULAR | Status: AC
Start: 2014-07-10 — End: 2014-07-10
  Filled 2014-07-10: qty 30

## 2014-07-10 SURGICAL SUPPLY — 51 items
ADH SKN CLS APL DERMABOND .7 (GAUZE/BANDAGES/DRESSINGS) ×1
BINDER BREAST LRG (GAUZE/BANDAGES/DRESSINGS) ×2 IMPLANT
BINDER BREAST XLRG (GAUZE/BANDAGES/DRESSINGS) IMPLANT
CANISTER SUCTION 2500CC (MISCELLANEOUS) ×3 IMPLANT
CHLORAPREP W/TINT 26ML (MISCELLANEOUS) ×3 IMPLANT
CLOSURE STERI-STRIP 1/2X4 (GAUZE/BANDAGES/DRESSINGS) ×1
CLOSURE WOUND 1/2 X4 (GAUZE/BANDAGES/DRESSINGS)
CLSR STERI-STRIP ANTIMIC 1/2X4 (GAUZE/BANDAGES/DRESSINGS) ×1 IMPLANT
CONT SPEC 4OZ CLIKSEAL STRL BL (MISCELLANEOUS) IMPLANT
COVER SURGICAL LIGHT HANDLE (MISCELLANEOUS) ×3 IMPLANT
DECANTER SPIKE VIAL GLASS SM (MISCELLANEOUS) ×2 IMPLANT
DERMABOND ADVANCED (GAUZE/BANDAGES/DRESSINGS) ×2
DERMABOND ADVANCED .7 DNX12 (GAUZE/BANDAGES/DRESSINGS) IMPLANT
DEVICE DUBIN SPECIMEN MAMMOGRA (MISCELLANEOUS) IMPLANT
DRAPE PED LAPAROTOMY (DRAPES) ×3 IMPLANT
DRAPE UTILITY 15X26 W/TAPE STR (DRAPE) ×6 IMPLANT
ELECT CAUTERY BLADE 6.4 (BLADE) ×3 IMPLANT
ELECT REM PT RETURN 9FT ADLT (ELECTROSURGICAL) ×3
ELECTRODE REM PT RTRN 9FT ADLT (ELECTROSURGICAL) ×1 IMPLANT
GAUZE SPONGE 4X4 12PLY STRL (GAUZE/BANDAGES/DRESSINGS) IMPLANT
GLOVE BIO SURGEON STRL SZ 6 (GLOVE) ×3 IMPLANT
GLOVE BIOGEL PI IND STRL 6.5 (GLOVE) ×1 IMPLANT
GLOVE BIOGEL PI INDICATOR 6.5 (GLOVE) ×2
GOWN STRL REUS W/ TWL LRG LVL3 (GOWN DISPOSABLE) ×1 IMPLANT
GOWN STRL REUS W/TWL 2XL LVL3 (GOWN DISPOSABLE) ×4 IMPLANT
GOWN STRL REUS W/TWL LRG LVL3 (GOWN DISPOSABLE) ×3
HEMOSTAT SURGICEL 2X4 FIBR (HEMOSTASIS) ×2 IMPLANT
KIT BASIN OR (CUSTOM PROCEDURE TRAY) ×3 IMPLANT
KIT MARKER MARGIN INK (KITS) IMPLANT
KIT ROOM TURNOVER OR (KITS) ×3 IMPLANT
NDL HYPO 25GX1X1/2 BEV (NEEDLE) ×1 IMPLANT
NEEDLE HYPO 25GX1X1/2 BEV (NEEDLE) ×3 IMPLANT
NS IRRIG 1000ML POUR BTL (IV SOLUTION) ×3 IMPLANT
PACK SURGICAL SETUP 50X90 (CUSTOM PROCEDURE TRAY) ×3 IMPLANT
PAD ARMBOARD 7.5X6 YLW CONV (MISCELLANEOUS) ×6 IMPLANT
PENCIL BUTTON HOLSTER BLD 10FT (ELECTRODE) ×3 IMPLANT
SPONGE GAUZE 4X4 12PLY STER LF (GAUZE/BANDAGES/DRESSINGS) ×2 IMPLANT
SPONGE LAP 18X18 X RAY DECT (DISPOSABLE) ×3 IMPLANT
STRIP CLOSURE SKIN 1/2X4 (GAUZE/BANDAGES/DRESSINGS) IMPLANT
SUT MON AB 4-0 PC3 18 (SUTURE) ×3 IMPLANT
SUT SILK 2 0 FS (SUTURE) IMPLANT
SUT VIC AB 3-0 SH 27 (SUTURE) ×3
SUT VIC AB 3-0 SH 27X BRD (SUTURE) ×1 IMPLANT
SYR BULB 3OZ (MISCELLANEOUS) ×3 IMPLANT
SYR CONTROL 10ML LL (SYRINGE) ×3 IMPLANT
TOWEL OR 17X24 6PK STRL BLUE (TOWEL DISPOSABLE) ×3 IMPLANT
TOWEL OR 17X26 10 PK STRL BLUE (TOWEL DISPOSABLE) ×1 IMPLANT
TUBE CONNECTING 12'X1/4 (SUCTIONS) ×1
TUBE CONNECTING 12X1/4 (SUCTIONS) ×2 IMPLANT
WATER STERILE IRR 1000ML POUR (IV SOLUTION) IMPLANT
YANKAUER SUCT BULB TIP NO VENT (SUCTIONS) ×3 IMPLANT

## 2014-07-10 NOTE — Anesthesia Procedure Notes (Signed)
Procedure Name: LMA Insertion Date/Time: 07/10/2014 3:33 PM Performed by: Ollen Bowl Pre-anesthesia Checklist: Patient identified, Emergency Drugs available, Suction available, Patient being monitored and Timeout performed Patient Re-evaluated:Patient Re-evaluated prior to inductionOxygen Delivery Method: Circle system utilized and Simple face mask Preoxygenation: Pre-oxygenation with 100% oxygen Intubation Type: IV induction LMA: LMA inserted LMA Size: 5.0 Number of attempts: 1 Airway Equipment and Method: Patient positioned with wedge pillow Placement Confirmation: positive ETCO2 and breath sounds checked- equal and bilateral Tube secured with: Tape Dental Injury: Teeth and Oropharynx as per pre-operative assessment

## 2014-07-10 NOTE — Anesthesia Preprocedure Evaluation (Addendum)
Anesthesia Evaluation  Patient identified by MRN, date of birth, ID band Patient awake    Reviewed: Allergy & Precautions, H&P , NPO status , Patient's Chart, lab work & pertinent test results  History of Anesthesia Complications Negative for: history of anesthetic complications  Airway Mallampati: II TM Distance: >3 FB Neck ROM: Full    Dental  (+) Missing, Dental Advisory Given   Pulmonary Current Smoker,  breath sounds clear to auscultation        Cardiovascular - anginanegative cardio ROS  Rhythm:Regular Rate:Normal     Neuro/Psych  Headaches, Depression    GI/Hepatic Neg liver ROS, GERD-  Medicated and Controlled,  Endo/Other  negative endocrine ROS  Renal/GU negative Renal ROS     Musculoskeletal  (+) Arthritis -, Osteoarthritis,    Abdominal   Peds  Hematology  (+) Blood dyscrasia (Hb 10.3), anemia ,   Anesthesia Other Findings Breast cancer: surgery, chemo  Reproductive/Obstetrics                          Anesthesia Physical  Anesthesia Plan  ASA: III  Anesthesia Plan: General   Post-op Pain Management:    Induction: Intravenous  Airway Management Planned: LMA  Additional Equipment:   Intra-op Plan:   Post-operative Plan:   Informed Consent: I have reviewed the patients History and Physical, chart, labs and discussed the procedure including the risks, benefits and alternatives for the proposed anesthesia with the patient or authorized representative who has indicated his/her understanding and acceptance.   Dental advisory given  Plan Discussed with: CRNA and Surgeon  Anesthesia Plan Comments:         Anesthesia Quick Evaluation

## 2014-07-10 NOTE — Interval H&P Note (Signed)
History and Physical Interval Note:  07/10/2014 2:44 PM  [name]  has presented today for surgery, with the diagnosis of left breast hematoma  The various methods of treatment have been discussed with the patient and family. After consideration of risks, benefits and other options for treatment, the patient has consented to  Procedure(s): EVACUATION OF LEFT BREAST HEMATOMA  (Left) as a surgical intervention .  The patient's history has been reviewed, patient examined, no change in status, stable for surgery.  I have reviewed the patient's chart and labs.  Questions were answered to the patient's satisfaction.     Timonthy Hovater

## 2014-07-10 NOTE — Transfer of Care (Signed)
Immediate Anesthesia Transfer of Care Note  Patient: Laura Matthews  Procedure(s) Performed: Procedure(s): EVACUATION OF LEFT BREAST HEMATOMA  (Left)  Patient Location: PACU  Anesthesia Type:General  Level of Consciousness: awake and alert   Airway & Oxygen Therapy: Patient Spontanous Breathing and Patient connected to nasal cannula oxygen  Post-op Assessment: Report given to PACU RN and Post -op Vital signs reviewed and stable  Post vital signs: Reviewed and stable  Complications: No apparent anesthesia complications

## 2014-07-10 NOTE — Op Note (Signed)
PRE-OPERATIVE DIAGNOSIS: left breast cancer with post hematoma  POST-OPERATIVE DIAGNOSIS:  Same + breast lymphedema  PROCEDURE:  Procedure(s): Evacuation of left breast hematoma  SURGEON:  Surgeon(s): Stark Klein, MD  ANESTHESIA:   local and general  DRAINS: none   LOCAL MEDICATIONS USED:  BUPIVICAINE  and LIDOCAINE   SPECIMEN:  No Specimen  DISPOSITION OF SPECIMEN:  N/A  COUNTS:  YES  DICTATION: .Dragon Dictation  PLAN OF CARE: Discharge to home after PACU  PATIENT DISPOSITION:  PACU - hemodynamically stable.  EBL :  50 mL old blood and clot  Findings:  Breast cavity full of old blood and clot.  Remainder of breast with significant lymphedema.  No active bleeding.  Procedure:    The patient was identified in the holding area and taken to the OR where she was placed supine on the OR table.  General anesthesia was induced.  The left breast was prepped and draped in sterile fashion.  Time out was performed according to the surgical safety checklist.  When all was complete, we continued.    The old incision was infiltrated with local.  The incision was reopened with a #10 blade.  The liquid portion of the hematoma was aspirated.  The clot was then removed.  The sides of the cavity were bluntly debrided with a lap sponge.  The entire cavity was cauterized.  No active bleeding was seen before or after cauterization.  Fibrillar was then placed into the cavity.  Clips were placed on the sides where the clips came off.  The skin was then closed with 3-0 vicryl deep dermal sutures and 4-0 monocryl running subcuticular suture.  The wound was cleaned, dried, and dressed with dermabond, steristrips, gauze, and a breast binder.  Needle, sponge, and instrument counts were correct times 2.    The patient was allowed to emerge from anesthesia and was taken to the PACU in stable condition.

## 2014-07-10 NOTE — Discharge Instructions (Signed)
Ice packs as needed for comfort.  OK to take 3-4 pain pills every four hours as needed for pain.    Call for significant swelling.   What to eat:  For your first meals, you should eat lightly; only small meals initially.  If you do not have nausea, you may eat larger meals.  Avoid spicy, greasy and heavy food.    General Anesthesia, Adult, Care After  Refer to this sheet in the next few weeks. These instructions provide you with information on caring for yourself after your procedure. Your health care provider may also give you more specific instructions. Your treatment has been planned according to current medical practices, but problems sometimes occur. Call your health care provider if you have any problems or questions after your procedure.  WHAT TO EXPECT AFTER THE PROCEDURE  After the procedure, it is typical to experience:  Sleepiness.  Nausea and vomiting. HOME CARE INSTRUCTIONS  For the first 24 hours after general anesthesia:  Have a responsible person with you.  Do not drive a car. If you are alone, do not take public transportation.  Do not drink alcohol.  Do not take medicine that has not been prescribed by your health care provider.  Do not sign important papers or make important decisions.  You may resume a normal diet and activities as directed by your health care provider.  Change bandages (dressings) as directed.  If you have questions or problems that seem related to general anesthesia, call the hospital and ask for the anesthetist or anesthesiologist on call. SEEK MEDICAL CARE IF:  You have nausea and vomiting that continue the day after anesthesia.  You develop a rash. SEEK IMMEDIATE MEDICAL CARE IF:  You have difficulty breathing.  You have chest pain.  You have any allergic problems. Document Released: 01/23/2001 Document Revised: 06/19/2013 Document Reviewed: 05/02/2013  [facility] Patient Information 2014 [place], [place].

## 2014-07-10 NOTE — H&P (View-Only) (Signed)
HISTORY: Pt is around 2 weeks s/p left lumpectomy and sentinel lymph node biopsy.  She is having a lot of swelling and bruising on the left breast.  She is also having a bit of drainage around her drain.  Her drain output is 35-50/day.  She denies fever/chills.      EXAM: General:  Alert and oriented. Incision:  No evidence of infection.  Liquid hematoma   PATHOLOGY: Diagnosis 1. Lymph node, sentinel, biopsy, Left axillary #1 - METASTATIC CARCINOMA IN ONE LYMPH NODE (1/1). 2. Lymph node, sentinel, biopsy, Left axillary #2 - ONE BENIGN LYMPH NODE (0/1). 1 of 4 FINAL for Laura Matthews, Laura Matthews ([id number]) Diagnosis(continued) 3. Lymph node, sentinel, biopsy, Left axillary #3 - METASTATIC CARCINOMA IN ONE LYMPH NODE (1/1). 4. Lymph node, sentinel, biopsy, Left axillary #4 - MICROMETASTATIC CARCINOMA IN ONE LYMPH NODE (1/1). 5. Lymph node, sentinel, biopsy, Left axillary #5 - METASTATIC CARCINOMA IN ONE LYMPH NODE (1/1). 6. Breast, partial mastectomy, Left - INVASIVE DUCTAL CARCINOMA, 1.8 CM. - DUCTAL CARCINOMA IN SITU. - CLOSEST MARGIN INFERIOR AT LESS THAN 0.1 CM. - LYMPHATIC VASCULAR INVASION PRESENT. 7. Lymph nodes, regional resection, Axillary left content - METASTATIC CARCINOMA IN EIGHT OF TWENTY-TWO LYMPH NODES (8/22).   ASSESSMENT AND PLAN:   Breast cancer of upper-outer quadrant of left female breast Hematoma aspirated.  No evidence of infection.  Follow up next week.  Get her set up for a port.      Milus Height, MD Surgical Oncology, [facility]  No PCP Per Patient No ref. provider found

## 2014-07-11 ENCOUNTER — Other Ambulatory Visit: Payer: Self-pay | Admitting: Oncology

## 2014-07-14 ENCOUNTER — Other Ambulatory Visit: Payer: Self-pay | Admitting: Oncology

## 2014-07-14 ENCOUNTER — Encounter (HOSPITAL_COMMUNITY): Payer: Self-pay | Admitting: General Surgery

## 2014-07-14 NOTE — Anesthesia Postprocedure Evaluation (Signed)
  Anesthesia Post-op Note  Patient: Laura Matthews  Procedure(s) Performed: Procedure(s): EVACUATION OF LEFT BREAST HEMATOMA  (Left)  Patient Location: PACU  Anesthesia Type:General  Level of Consciousness: awake, alert  and oriented  Airway and Oxygen Therapy: Patient Spontanous Breathing  Post-op Pain: none  Post-op Assessment: Post-op Vital signs reviewed  Post-op Vital Signs: Reviewed  Last Vitals:  Filed Vitals:   07/10/14 1711  BP: 134/84  Pulse: 88  Temp:   Resp: 15    Complications: No apparent anesthesia complications

## 2014-07-15 ENCOUNTER — Telehealth: Payer: Self-pay | Admitting: Nurse Practitioner

## 2014-07-15 ENCOUNTER — Ambulatory Visit: Payer: Medicaid Other

## 2014-07-15 ENCOUNTER — Other Ambulatory Visit: Payer: Self-pay | Admitting: Emergency Medicine

## 2014-07-15 ENCOUNTER — Ambulatory Visit: Payer: Self-pay | Admitting: Nurse Practitioner

## 2014-07-15 ENCOUNTER — Other Ambulatory Visit (HOSPITAL_BASED_OUTPATIENT_CLINIC_OR_DEPARTMENT_OTHER): Payer: Medicaid Other

## 2014-07-15 ENCOUNTER — Other Ambulatory Visit: Payer: Self-pay

## 2014-07-15 ENCOUNTER — Ambulatory Visit (HOSPITAL_BASED_OUTPATIENT_CLINIC_OR_DEPARTMENT_OTHER): Payer: Medicaid Other | Admitting: Nurse Practitioner

## 2014-07-15 VITALS — BP 140/85 | HR 79 | Temp 98.6°F | Resp 18 | Ht 70.0 in | Wt 176.3 lb

## 2014-07-15 DIAGNOSIS — C773 Secondary and unspecified malignant neoplasm of axilla and upper limb lymph nodes: Secondary | ICD-10-CM

## 2014-07-15 DIAGNOSIS — C50412 Malignant neoplasm of upper-outer quadrant of left female breast: Secondary | ICD-10-CM

## 2014-07-15 DIAGNOSIS — Z170 Estrogen receptor positive status [ER+]: Secondary | ICD-10-CM

## 2014-07-15 DIAGNOSIS — C50919 Malignant neoplasm of unspecified site of unspecified female breast: Secondary | ICD-10-CM

## 2014-07-15 DIAGNOSIS — C50912 Malignant neoplasm of unspecified site of left female breast: Secondary | ICD-10-CM

## 2014-07-15 DIAGNOSIS — F17200 Nicotine dependence, unspecified, uncomplicated: Secondary | ICD-10-CM

## 2014-07-15 LAB — COMPREHENSIVE METABOLIC PANEL (CC13)
ALBUMIN: 3.9 g/dL (ref 3.5–5.0)
ALT: 14 U/L (ref 0–55)
ANION GAP: 9 meq/L (ref 3–11)
AST: 16 U/L (ref 5–34)
Alkaline Phosphatase: 80 U/L (ref 40–150)
BUN: 10 mg/dL (ref 7.0–26.0)
CALCIUM: 9 mg/dL (ref 8.4–10.4)
CHLORIDE: 102 meq/L (ref 98–109)
CO2: 27 meq/L (ref 22–29)
Creatinine: 0.7 mg/dL (ref 0.6–1.1)
Glucose: 180 mg/dl — ABNORMAL HIGH (ref 70–140)
POTASSIUM: 3.9 meq/L (ref 3.5–5.1)
Sodium: 138 mEq/L (ref 136–145)
Total Bilirubin: 0.23 mg/dL (ref 0.20–1.20)
Total Protein: 7.6 g/dL (ref 6.4–8.3)

## 2014-07-15 LAB — CBC WITH DIFFERENTIAL/PLATELET
BASO%: 1 % (ref 0.0–2.0)
Basophils Absolute: 0.1 10*3/uL (ref 0.0–0.1)
EOS%: 2.5 % (ref 0.0–7.0)
Eosinophils Absolute: 0.2 10*3/uL (ref 0.0–0.5)
HEMATOCRIT: 34.5 % — AB (ref 34.8–46.6)
HEMOGLOBIN: 11.1 g/dL — AB (ref 11.6–15.9)
LYMPH#: 2.1 10*3/uL (ref 0.9–3.3)
LYMPH%: 27.7 % (ref 14.0–49.7)
MCH: 29.4 pg (ref 25.1–34.0)
MCHC: 32.1 g/dL (ref 31.5–36.0)
MCV: 91.6 fL (ref 79.5–101.0)
MONO#: 0.7 10*3/uL (ref 0.1–0.9)
MONO%: 8.8 % (ref 0.0–14.0)
NEUT%: 60 % (ref 38.4–76.8)
NEUTROS ABS: 4.5 10*3/uL (ref 1.5–6.5)
Platelets: 414 10*3/uL — ABNORMAL HIGH (ref 145–400)
RBC: 3.76 10*6/uL (ref 3.70–5.45)
RDW: 14.6 % — AB (ref 11.2–14.5)
WBC: 7.5 10*3/uL (ref 3.9–10.3)

## 2014-07-15 MED ORDER — ONDANSETRON HCL 8 MG PO TABS: 8.0000 mg | ORAL_TABLET | Freq: Two times a day (BID) | ORAL | Status: DC | PRN

## 2014-07-15 MED ORDER — DEXAMETHASONE 4 MG PO TABS: ORAL_TABLET | ORAL | Status: DC

## 2014-07-15 MED ORDER — PROCHLORPERAZINE MALEATE 10 MG PO TABS: 10.0000 mg | ORAL_TABLET | Freq: Four times a day (QID) | ORAL | Status: DC | PRN

## 2014-07-15 MED ORDER — LIDOCAINE-PRILOCAINE 2.5-2.5 % EX CREA: 1.0000 "application " | TOPICAL_CREAM | CUTANEOUS | Status: DC | PRN

## 2014-07-15 MED ORDER — LORAZEPAM 0.5 MG PO TABS: 0.5000 mg | ORAL_TABLET | Freq: Four times a day (QID) | ORAL | Status: DC | PRN

## 2014-07-15 MED ORDER — OXYCODONE HCL 5 MG PO TABS: 15.0000 mg | ORAL_TABLET | ORAL | Status: DC | PRN

## 2014-07-15 NOTE — Patient Instructions (Addendum)
Nausea medication  Decadron/Dexamethasone: take two tablets once the day after chemotherapy, then take two tablets twice a day for two days (breakfast and dinner).    Ondansetron/Zofran: take twice a day as needed starting the third day after chemotherapy.   Prochlorperazine/Compazine: Take with breakfast, lunch, dinner, and before bed starting the day after chemo  Lorazepam/Ativan: Take at night before bed for anxiety/insomnia

## 2014-07-15 NOTE — Telephone Encounter (Signed)
, °

## 2014-07-16 ENCOUNTER — Encounter: Payer: Self-pay | Admitting: Nurse Practitioner

## 2014-07-16 ENCOUNTER — Ambulatory Visit: Payer: Self-pay

## 2014-07-16 NOTE — Progress Notes (Addendum)
[place]  Telephone:([phone]) [phone] Fax:(336) [phone]     ID: Laura Matthews DOB: [birth year]  MR#: [id number]  FWY#:[id number]  Patient Care Team: No Pcp Per Patient as PCP - General (General Practice) Laura Klein, MD as Consulting Physician (General Surgery) Laura Cruel, MD as Consulting Physician (Oncology) Laura Silversmith, MD as Consulting Physician (Radiation Oncology) [facility] Laura Pion, NP as Nurse Practitioner (Nurse Practitioner)  CHIEF COMPLAINT: Newly diagnosed breast cancer  CURRENT TREATMENT: to start adjuvant chemotherapy  BREAST CANCER HISTORY: From the original intake note:  The patient herself palpated a mass in her left breast and brought it to the attention of [facility] NP 04/22/2014. She palpated a large nontender mass at the 4:00 position as well as a small tender mass in the 6:00 position of the left breast. The patient was set up for imaging at the breast Center where on 05/05/2014 she underwent bilateral diagnostic mammography and left breast ultrasonography. The mammogram showed an irregular mass in the outer left breast with no other suspicious findings. This was firm and fixed at the 3:00 position approximately 10 cm from the nipple. Ultrasound showed a hypoechoic irregular solid mass in this area, measuring 1.8 cm. Ultrasound of the left axilla showed 2 suspicious level I lymph nodes, the largest measuring 1 cm, with a thickened pole. A smaller lymph node measured 0.6 cm but had no visible fatty hilum.  On 05/09/2014 the patient underwent biopsy of the left breast mass (she refused biopsy of the left axilla) which showed ([id number]) and invasive ductal carcinoma, grade 2, estrogen receptor positive at 100%, with strong staining intensity, progesterone receptor 80% positive, with weak staining intensity, with an MIB-1 of 80%, and no HER-2 amplification.  On 05/16/2014 the patient underwent bilateral breast MRI. This showed a 1.7 cm irregular  enhancing mass at the 3:00 position of the left breast associated with the biopsy cleft. There were 3 level I left axillary lymph nodes with thickened cortices. The largest measured 1.3 cm. There were no other findings of concern.  Her subsequent history is as detailed below.  INTERVAL HISTORY: Laura Matthews returns today for follow up of her breast cancer. She believes she should be starting chemo today, but she raises concerns that Dr. Barry Matthews is not quite done with surgery to Laura Matthews's left breast. There was a significant hematoma to this breast, that required surgical evacuation. The patient is very tearful and upset in clinic today. She says that after the evacuation the breast became smaller, but that she believes it is swelling back up again. The breast itself is also painful. She states she had to miss chemo class again last week because of appointments with surgical oncology.   REVIEW OF SYSTEMS: Laura Matthews denies fevers, chills, nausea, vomiting, or changes in bowel or bladder habits. She has no shortness of breath, chest pain, palpitations, or fatigue. Her left upper arm is numb at times and her ROM is limited with some pain during reaching. A detailed review of systems was otherwise negative.   PAST MEDICAL HISTORY: Past Medical History  Diagnosis Date  . Mental disorder     depression-  . Arthritis   . Hot flashes   . Head ache   . Malignant neoplasm of breast (female), unspecified site   . Family history of malignant neoplasm of breast   . Depression     pt denies   . Anemia     low iron    PAST SURGICAL HISTORY: Past Surgical History  Procedure Laterality Date  . Knee surgery  1995    right  . Foot surgery  2011    rt  . Tubal ligation    . Uterine fibroid surgery  2005    ablation  . Breast lumpectomy with sentinel lymph node biopsy Left 06/11/2014    Procedure: BREAST LUMPECTOMY WITH SENTINEL LYMPH NODE BX, POSSIBLE AXILLARY LYMPH NODE DISSECTION;  Surgeon: Laura Klein, MD;   Location: [facility];  Service: General;  Laterality: Left;  . Portacath placement N/A 07/02/2014    Procedure: INSERTION PORT-A-CATH;  Surgeon: Laura Klein, MD;  Location: [facility] [place];  Service: General;  Laterality: N/A;  . Incision and drainage Left 07/02/2014    Procedure: Drainage left breast seroma;  Surgeon: Laura Klein, MD;  Location: [facility];  Service: General;  Laterality: Left;  . Evacuation breast hematoma Left 07/10/2014    Procedure: EVACUATION OF LEFT BREAST HEMATOMA ;  Surgeon: Laura Klein, MD;  Location: [facility];  Service: General;  Laterality: Left;    FAMILY HISTORY Family History  Problem Relation Age of Onset  . Breast cancer Mother 18    currently 62; TAH/BSO d/t ?cervical ca at 36  . Breast cancer Maternal Aunt     dx 58s; currently in her late 93s  . Lung cancer Father     deceased 38   the patient's father died at the age of 46 from lung cancer. The patient's mother was diagnosed with breast cancer at the age of 50. She survives. The patient had 2 brothers, and 2 sisters. One brother may have a diagnosis of "stomach cancer"  GYNECOLOGIC HISTORY:  No LMP recorded. Patient has had an ablation. Menarche age 52 which is also when the patient first had a child. She is GX P3. She stopped having periods after laser ablation in 2005.  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 At home she lives with her daughter Laura Matthews and her 4 children who are 60, 8, 5, and 4. 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 The patient's son Laura Matthews is a fast food cook in [place] and the patient's daughter Laura Matthews works as a Scientist, water quality, also in [place]. The patient has 7 grandchildren. She is not a church attender    ADVANCED DIRECTIVES: Not in place; this was discussed with the patient on her first visit, 05/21/2014, and she indicated she plans to name her daughter Laura Matthews as her healthcare power  of attorney   HEALTH MAINTENANCE: History  Substance Use Topics  . Smoking status: Current Every Day Smoker -- 0.25 packs/day    Types: Cigarettes  . Smokeless tobacco: Never Used  . Alcohol Use: No     Comment: Percoset,Vicodin, Oxycotin     Colonoscopy:  PAP:  Bone density:  Lipid panel:  Allergies  Allergen Reactions  . Norco [Hydrocodone-Acetaminophen] Itching and Nausea Only    Pt reported allergy.    Current Outpatient Prescriptions  Medication Sig Dispense Refill  . cephALEXin (KEFLEX) 500 MG capsule Take 1 capsule (500 mg total) by mouth 4 (four) times daily.  40 capsule  0  . ibuprofen (ADVIL,MOTRIN) 200 MG tablet Take 400 mg by mouth every 6 (six) hours as needed for moderate pain.      Marland Kitchen ondansetron (ZOFRAN ODT) 8 MG disintegrating tablet Take 1 tablet (8 mg total) by mouth every 8 (eight) hours as needed for nausea or vomiting.  20 tablet  0  . ranitidine (ZANTAC) 150 MG tablet Take 1 tablet (150 mg total) by mouth 2 (two) times daily.  60 tablet  0  . dexamethasone (DECADRON) 4 MG tablet Take 2 tablets by mouth once a day on the day after chemotherapy and then take 2 tablets two times a day for 2 days. Take with food.  30 tablet  1  . lidocaine-prilocaine (EMLA) cream Apply 1 application topically as needed.  30 g  0  . LORazepam (ATIVAN) 0.5 MG tablet Take 1 tablet (0.5 mg total) by mouth every 6 (six) hours as needed (Nausea or vomiting).  30 tablet  0  . ondansetron (ZOFRAN) 8 MG tablet Take 1 tablet (8 mg total) by mouth 2 (two) times daily as needed. Start on the third day after chemotherapy.  30 tablet  1  . oxyCODONE (OXY IR/ROXICODONE) 5 MG immediate release tablet Take 3-4 tablets (15-20 mg total) by mouth every 4 (four) hours as needed for moderate pain, severe pain or breakthrough pain.  30 tablet  0  . prochlorperazine (COMPAZINE) 10 MG tablet Take 1 tablet (10 mg total) by mouth every 6 (six) hours as needed (Nausea or vomiting).  30 tablet  1   No  current facility-administered medications for this visit.    OBJECTIVE: Middle-aged [place] American woman who appears stated age 60 Vitals:   07/15/14 1048  BP: 140/85  Matthews: 79  Temp: 98.6 F (37 C)  Resp: 18     Body mass index is 25.3 kg/(m^2).    ECOG FS:1 - Symptomatic but completely ambulatory  Skin: warm, dry  HEENT: sclerae anicteric, conjunctivae pink, oropharynx clear. No thrush or mucositis.  Lymph Nodes: No cervical or supraclavicular lymphadenopathy  Lungs: clear to auscultation bilaterally, no rales, wheezes, or rhonci  Heart: regular rate and rhythm  Abdomen: round, soft, non tender, positive bowel sounds  Musculoskeletal: No focal spinal tenderness, no peripheral edema  Neuro: non focal, well oriented, positive affect  Breasts: left breast status post recent surgery, edematous, and bandaged up with porous white tape. No drainage to bandage noticed. Right breast unremarkable.    LAB RESULTS:  CMP     Component Value Date/Time   NA 138 07/15/2014 1042   NA 138 06/03/2014 1326   K 3.9 07/15/2014 1042   K 3.5* 06/03/2014 1326   CL 95* 06/03/2014 1326   CO2 27 07/15/2014 1042   CO2 30 06/03/2014 1326   GLUCOSE 180* 07/15/2014 1042   GLUCOSE 114* 06/03/2014 1326   BUN 10.0 07/15/2014 1042   BUN 14 06/03/2014 1326   CREATININE 0.7 07/15/2014 1042   CREATININE 0.63 06/03/2014 1326   CALCIUM 9.0 07/15/2014 1042   CALCIUM 10.2 06/03/2014 1326   PROT 7.6 07/15/2014 1042   PROT 8.6* 06/03/2014 1326   ALBUMIN 3.9 07/15/2014 1042   ALBUMIN 4.7 06/03/2014 1326   AST 16 07/15/2014 1042   AST 21 06/03/2014 1326   ALT 14 07/15/2014 1042   ALT 21 06/03/2014 1326   ALKPHOS 80 07/15/2014 1042   ALKPHOS 75 06/03/2014 1326   BILITOT 0.23 07/15/2014 1042   BILITOT 0.5 06/03/2014 1326   GFRNONAA >90 06/03/2014 1326   GFRAA >90 06/03/2014 1326    I No results found for this basename: SPEP,  UPEP,   kappa and lambda light chains    Lab Results  Component Value Date   WBC 7.5 07/15/2014   NEUTROABS 4.5  07/15/2014   HGB 11.1* 07/15/2014   HCT 34.5*  07/15/2014   MCV 91.6 07/15/2014   PLT 414* 07/15/2014      Chemistry      Component Value Date/Time   NA 138 07/15/2014 1042   NA 138 06/03/2014 1326   K 3.9 07/15/2014 1042   K 3.5* 06/03/2014 1326   CL 95* 06/03/2014 1326   CO2 27 07/15/2014 1042   CO2 30 06/03/2014 1326   BUN 10.0 07/15/2014 1042   BUN 14 06/03/2014 1326   CREATININE 0.7 07/15/2014 1042   CREATININE 0.63 06/03/2014 1326      Component Value Date/Time   CALCIUM 9.0 07/15/2014 1042   CALCIUM 10.2 06/03/2014 1326   ALKPHOS 80 07/15/2014 1042   ALKPHOS 75 06/03/2014 1326   AST 16 07/15/2014 1042   AST 21 06/03/2014 1326   ALT 14 07/15/2014 1042   ALT 21 06/03/2014 1326   BILITOT 0.23 07/15/2014 1042   BILITOT 0.5 06/03/2014 1326       No results found for this basename: LABCA2    No components found with this basename: LABCA125    No results found for this basename: INR,  in the last 168 hours  Urinalysis    Component Value Date/Time   COLORURINE YELLOW 06/03/2014 [place] 06/03/2014 1050   LABSPEC >1.030* 06/03/2014 1050   PHURINE 6.0 06/03/2014 1050   GLUCOSEU NEGATIVE 06/03/2014 1050   HGBUR SMALL* 06/03/2014 1050   BILIRUBINUR SMALL* 06/03/2014 1050   KETONESUR NEGATIVE 06/03/2014 [place] 06/03/2014 1050   UROBILINOGEN 0.2 06/03/2014 1050   NITRITE NEGATIVE 06/03/2014 1050   LEUKOCYTESUR TRACE* 06/03/2014 1050    STUDIES: Chest Port 1 View  07/02/2014   CLINICAL DATA:  Postoperative from Port-A-Cath appliance insertion  EXAM: PORTABLE CHEST - 1 VIEW  COMPARISON:  PA and lateral chest of May 29, 2011  FINDINGS: The Port-A-Cath appliance tip lies in the midportion of the SVC. There is no postprocedure pneumothorax or hemo thorax. The lungs are adequately inflated and clear. The heart and mediastinal structures are within the limits of normal. There are numerous surgical clips in the left axillary region.  IMPRESSION: There is no postprocedure complication following  Port-A-Cath appliance placement.   Electronically Signed   By: David  Martinique   On: 07/02/2014 11:24   Dg Fluoro Guide Cv Line-no Report  07/02/2014   CLINICAL DATA: portacath insertion   FLOURO GUIDE CV LINE  Fluoroscopy was utilized by the requesting physician.  No radiographic  interpretation.     ASSESSMENT: 49 y.o. [place] woman status post left breast upper outer quadrant biopsy 05/09/2014 for a clinicalT1c N1, stage IIA invasive ductal carcinoma, grade 2, estrogen and progesterone receptor positive, HER-2 nonamplified, with an MIB-1 of 80%  (1) patient met with genetics counselor 05/29/2014 but decided against genetics testing   (2) tobacco abuse. The patient has been strongly urged to discontinue smoking.  (3) status post left lumpectomy and axillary lymph node dissection 06/11/2014 for a pT1c pN3, stage IIIC invasive ductal carcinoma, grade 3, with repeat HER-2 negative  (4) adjuvant chemotherapy to consist of doxorubicin and cyclophosphamide in dose dense fashion x4, with Neulasta support, to start 07/15/2014, to be followed by paclitaxel weekly x12  (5) adjuvant radiation to follow chemotherapy  (6) anti-estrogens for 5-10 years to follow radiation  PLAN: I spent about 30 minutes with Laura Matthews discussing her concerns. I explained to her that we must keep her safety as our number one priority. Receiving chemotherapy before we are "out of  the woods" with her surgical procedures puts her at risk for infection because of potential low neutrophil counts. I discussed this with Dr. [name] and he would like to delay the chemo for an additional 2 weeks.   In the meantime, Laura Matthews has an appointment with Dr. Barry Matthews coming up within that time frame. She will discuss the concerns about her left breast at that time. When she returns to clinic I will follow up with referring her to physical therapy for her left arm pain and numbness. It is also very important for Laura Matthews to attend chemotherapy class  next week to prepare her for the road ahead. We discussed her antiemetic schedule and these prescriptions, along with the EMLA cream, were sent to her pharmacy during this visit.   Laura Matthews understands and agrees with this plan. She has been encouraged to call with any issues that might arise before her next visit here.   Laura Duster, NP   07/16/2014 8:46 AM   ADDENDUM: Laura Matthews is a bit frustrated. After our discussion today though she understands that it just will not be safe to start treating her until she has had more closure on this wound. Also she never came to chemotherapy school. She admitted she really needs to get that done.  Because her breast cancer was estrogen receptor positive, we have more leeway on when to start the chemotherapy then would be the case otherwise. I reassured her that she is not compromising her ultimate chance of cure by a 2 week delay.  I personally saw this patient and performed a substantive portion of this encounter with the listed APP documented above.   Laura Cruel, MD

## 2014-07-22 ENCOUNTER — Other Ambulatory Visit: Payer: Self-pay

## 2014-07-22 ENCOUNTER — Ambulatory Visit: Payer: Self-pay | Admitting: Nurse Practitioner

## 2014-07-25 ENCOUNTER — Ambulatory Visit: Payer: Medicaid Other

## 2014-07-25 DIAGNOSIS — C773 Secondary and unspecified malignant neoplasm of axilla and upper limb lymph nodes: Principal | ICD-10-CM

## 2014-07-25 DIAGNOSIS — C50919 Malignant neoplasm of unspecified site of unspecified female breast: Secondary | ICD-10-CM

## 2014-07-25 MED ORDER — ONDANSETRON HCL 8 MG PO TABS: 8.0000 mg | ORAL_TABLET | Freq: Two times a day (BID) | ORAL | Status: DC | PRN

## 2014-07-28 ENCOUNTER — Ambulatory Visit: Payer: Medicaid Other | Attending: General Surgery | Admitting: Physical Therapy

## 2014-07-28 DIAGNOSIS — R293 Abnormal posture: Secondary | ICD-10-CM | POA: Diagnosis not present

## 2014-07-28 DIAGNOSIS — C50919 Malignant neoplasm of unspecified site of unspecified female breast: Secondary | ICD-10-CM | POA: Insufficient documentation

## 2014-07-28 DIAGNOSIS — IMO0001 Reserved for inherently not codable concepts without codable children: Secondary | ICD-10-CM | POA: Insufficient documentation

## 2014-07-29 ENCOUNTER — Ambulatory Visit (HOSPITAL_BASED_OUTPATIENT_CLINIC_OR_DEPARTMENT_OTHER): Payer: Medicaid Other

## 2014-07-29 ENCOUNTER — Encounter: Payer: Self-pay | Admitting: Nurse Practitioner

## 2014-07-29 ENCOUNTER — Ambulatory Visit (HOSPITAL_BASED_OUTPATIENT_CLINIC_OR_DEPARTMENT_OTHER): Payer: Medicaid Other | Admitting: Nurse Practitioner

## 2014-07-29 ENCOUNTER — Encounter (HOSPITAL_BASED_OUTPATIENT_CLINIC_OR_DEPARTMENT_OTHER): Payer: Medicaid Other

## 2014-07-29 ENCOUNTER — Telehealth: Payer: Self-pay | Admitting: Nurse Practitioner

## 2014-07-29 VITALS — BP 132/80 | HR 75 | Temp 98.5°F | Resp 18 | Ht 70.0 in | Wt 180.5 lb

## 2014-07-29 DIAGNOSIS — Z5111 Encounter for antineoplastic chemotherapy: Secondary | ICD-10-CM

## 2014-07-29 DIAGNOSIS — C50919 Malignant neoplasm of unspecified site of unspecified female breast: Secondary | ICD-10-CM

## 2014-07-29 DIAGNOSIS — C773 Secondary and unspecified malignant neoplasm of axilla and upper limb lymph nodes: Secondary | ICD-10-CM

## 2014-07-29 DIAGNOSIS — C50412 Malignant neoplasm of upper-outer quadrant of left female breast: Secondary | ICD-10-CM

## 2014-07-29 DIAGNOSIS — Z170 Estrogen receptor positive status [ER+]: Secondary | ICD-10-CM

## 2014-07-29 DIAGNOSIS — C50912 Malignant neoplasm of unspecified site of left female breast: Secondary | ICD-10-CM

## 2014-07-29 LAB — CBC WITH DIFFERENTIAL/PLATELET
BASO%: 0.8 % (ref 0.0–2.0)
BASOS ABS: 0.1 10*3/uL (ref 0.0–0.1)
EOS ABS: 0.2 10*3/uL (ref 0.0–0.5)
EOS%: 2.4 % (ref 0.0–7.0)
HCT: 36.9 % (ref 34.8–46.6)
HEMOGLOBIN: 11.8 g/dL (ref 11.6–15.9)
LYMPH#: 2.5 10*3/uL (ref 0.9–3.3)
LYMPH%: 29.4 % (ref 14.0–49.7)
MCH: 28.7 pg (ref 25.1–34.0)
MCHC: 31.9 g/dL (ref 31.5–36.0)
MCV: 90.2 fL (ref 79.5–101.0)
MONO#: 0.6 10*3/uL (ref 0.1–0.9)
MONO%: 6.8 % (ref 0.0–14.0)
NEUT#: 5.2 10*3/uL (ref 1.5–6.5)
NEUT%: 60.6 % (ref 38.4–76.8)
Platelets: 419 10*3/uL — ABNORMAL HIGH (ref 145–400)
RBC: 4.09 10*6/uL (ref 3.70–5.45)
RDW: 14.5 % (ref 11.2–14.5)
WBC: 8.5 10*3/uL (ref 3.9–10.3)

## 2014-07-29 LAB — COMPREHENSIVE METABOLIC PANEL (CC13)
ALK PHOS: 81 U/L (ref 40–150)
ALT: 13 U/L (ref 0–55)
ANION GAP: 7 meq/L (ref 3–11)
AST: 17 U/L (ref 5–34)
Albumin: 3.9 g/dL (ref 3.5–5.0)
BUN: 7.4 mg/dL (ref 7.0–26.0)
CO2: 30 mEq/L — ABNORMAL HIGH (ref 22–29)
CREATININE: 0.7 mg/dL (ref 0.6–1.1)
Calcium: 9.6 mg/dL (ref 8.4–10.4)
Chloride: 102 mEq/L (ref 98–109)
GLUCOSE: 133 mg/dL (ref 70–140)
Potassium: 3.8 mEq/L (ref 3.5–5.1)
Sodium: 139 mEq/L (ref 136–145)
Total Bilirubin: 0.28 mg/dL (ref 0.20–1.20)
Total Protein: 7.6 g/dL (ref 6.4–8.3)

## 2014-07-29 MED ORDER — DOXORUBICIN HCL CHEMO IV INJECTION 2 MG/ML
60.0000 mg/m2 | Freq: Once | INTRAVENOUS | Status: AC
  Administered 2014-07-29: 118 mg via INTRAVENOUS
  Filled 2014-07-29: qty 59

## 2014-07-29 MED ORDER — PALONOSETRON HCL INJECTION 0.25 MG/5ML
INTRAVENOUS | Status: AC
  Filled 2014-07-29: qty 5

## 2014-07-29 MED ORDER — SODIUM CHLORIDE 0.9 % IV SOLN
Freq: Once | INTRAVENOUS | Status: AC
  Administered 2014-07-29: 15:00:00 via INTRAVENOUS

## 2014-07-29 MED ORDER — DEXAMETHASONE SODIUM PHOSPHATE 20 MG/5ML IJ SOLN
INTRAMUSCULAR | Status: AC
  Filled 2014-07-29: qty 5

## 2014-07-29 MED ORDER — PALONOSETRON HCL INJECTION 0.25 MG/5ML
0.2500 mg | Freq: Once | INTRAVENOUS | Status: AC
Start: 2014-07-29 — End: 2014-07-29
  Administered 2014-07-29: 0.25 mg via INTRAVENOUS

## 2014-07-29 MED ORDER — DEXAMETHASONE SODIUM PHOSPHATE 20 MG/5ML IJ SOLN
12.0000 mg | Freq: Once | INTRAMUSCULAR | Status: AC
  Administered 2014-07-29: 12 mg via INTRAVENOUS

## 2014-07-29 MED ORDER — SODIUM CHLORIDE 0.9 % IV SOLN
600.0000 mg/m2 | Freq: Once | INTRAVENOUS | Status: AC
  Administered 2014-07-29: 1180 mg via INTRAVENOUS
  Filled 2014-07-29: qty 59

## 2014-07-29 MED ORDER — HEPARIN SOD (PORK) LOCK FLUSH 100 UNIT/ML IV SOLN
500.0000 [IU] | Freq: Once | INTRAVENOUS | Status: AC | PRN
  Administered 2014-07-29: 500 [IU]
  Filled 2014-07-29: qty 5

## 2014-07-29 MED ORDER — SODIUM CHLORIDE 0.9 % IJ SOLN
10.0000 mL | INTRAMUSCULAR | Status: DC | PRN
  Administered 2014-07-29: 10 mL
  Filled 2014-07-29: qty 10

## 2014-07-29 MED ORDER — SODIUM CHLORIDE 0.9 % IV SOLN
150.0000 mg | Freq: Once | INTRAVENOUS | Status: AC
  Administered 2014-07-29: 150 mg via INTRAVENOUS
  Filled 2014-07-29: qty 5

## 2014-07-29 NOTE — Patient Instructions (Signed)
[facility] Discharge Instructions for Patients Receiving Chemotherapy  Today you received the following chemotherapy agents Adrimycin and Cytoxan  To help prevent nausea and vomiting after your treatment, we encourage you to take your nausea medication as directed per MD.   If you develop nausea and vomiting that is not controlled by your nausea medication, call the clinic.   BELOW ARE SYMPTOMS THAT SHOULD BE REPORTED IMMEDIATELY:  *FEVER GREATER THAN 100.5 F  *CHILLS WITH OR WITHOUT FEVER  NAUSEA AND VOMITING THAT IS NOT CONTROLLED WITH YOUR NAUSEA MEDICATION  *UNUSUAL SHORTNESS OF BREATH  *UNUSUAL BRUISING OR BLEEDING  TENDERNESS IN MOUTH AND THROAT WITH OR WITHOUT PRESENCE OF ULCERS  *URINARY PROBLEMS  *BOWEL PROBLEMS  UNUSUAL RASH Items with * indicate a potential emergency and should be followed up as soon as possible.  Feel free to call the clinic you have any questions or concerns. The clinic phone number is [phone].

## 2014-07-29 NOTE — Telephone Encounter (Signed)
per pof to sch pt appt-sent rmailt o MW to sch pt trmts-pt to get rejection 9/30 and get updated sch @ that time

## 2014-07-29 NOTE — Progress Notes (Signed)
[place]  [phone] [phone]     ID: Laura Matthews DOB: April 03, 1966  MR#: [id number]  JKK#:[id number]  Patient Care Team: No Pcp Per Patient as PCP - General (General Practice) Stark Klein, MD as Consulting Physician (General Surgery) Chauncey Cruel, MD as Consulting Physician (Oncology) Thea Silversmith, MD as Consulting Physician (Radiation Oncology) [facility] Bunnie Pion, NP as Nurse Practitioner (Nurse Practitioner)  CHIEF COMPLAINT: Newly diagnosed breast cancer  CURRENT TREATMENT: to start adjuvant chemotherapy  BREAST CANCER HISTORY: From the original intake note:  The patient herself palpated a mass in her left breast and brought it to the attention of [facility] NP 04/22/2014. She palpated a large nontender mass at the 4:00 position as well as a small tender mass in the 6:00 position of the left breast. The patient was set up for imaging at the breast Center where on 05/05/2014 she underwent bilateral diagnostic mammography and left breast ultrasonography. The mammogram showed an irregular mass in the outer left breast with no other suspicious findings. This was firm and fixed at the 3:00 position approximately 10 cm from the nipple. Ultrasound showed a hypoechoic irregular solid mass in this area, measuring 1.8 cm. Ultrasound of the left axilla showed 2 suspicious level I lymph nodes, the largest measuring 1 cm, with a thickened pole. A smaller lymph node measured 0.6 cm but had no visible fatty hilum.  On 05/09/2014 the patient underwent biopsy of the left breast mass (she refused biopsy of the left axilla) which showed ([id number]) and invasive ductal carcinoma, grade 2, estrogen receptor positive at 100%, with strong staining intensity, progesterone receptor 80% positive, with weak staining intensity, with an MIB-1 of 80%, and no HER-2 amplification.  On 05/16/2014 the patient underwent bilateral breast MRI. This showed a 1.7 cm irregular  enhancing mass at the 3:00 position of the left breast associated with the biopsy cleft. There were 3 level I left axillary lymph nodes with thickened cortices. The largest measured 1.3 cm. There were no other findings of concern.  Her subsequent history is as detailed below.  INTERVAL HISTORY: Laura Matthews returns today for follow up of her breast cancer. Since her last visit she has visited Dr. Barry Dienes for the swelling to her left breast. She was referred to the lymphedema clinic. But states that she will likely not be able to afford further treatments because her insurance company only pays for 2. Today is to the be start of dose dense doxorubicin and cyclophosphamide, with neulasta support on day 2. She was present in chemotherapy class last week and is just ready to begin.   REVIEW OF SYSTEMS: Laura Matthews denies fevers, chills, nausea, vomiting, or changes in bowel or bladder habits. She has no shortness of breath, chest pain, palpitations, or fatigue. Her left upper arm is numb at times and her ROM is limited with some pain during reaching. A detailed review of systems was otherwise negative   PAST MEDICAL HISTORY: Past Medical History  Diagnosis Date  . Mental disorder     depression-  . Arthritis   . Hot flashes   . Head ache   . Malignant neoplasm of breast (female), unspecified site   . Family history of malignant neoplasm of breast   . Depression     pt denies   . Anemia     low iron    PAST SURGICAL HISTORY: Past Surgical History  Procedure Laterality Date  . Knee surgery  1995    right  .  Foot surgery  2011    rt  . Tubal ligation    . Uterine fibroid surgery  2005    ablation  . Breast lumpectomy with sentinel lymph node biopsy Left 06/11/2014    Procedure: BREAST LUMPECTOMY WITH SENTINEL LYMPH NODE BX, POSSIBLE AXILLARY LYMPH NODE DISSECTION;  Surgeon: Stark Klein, MD;  Location: [place];  Service: General;  Laterality: Left;  . Portacath placement N/A  07/02/2014    Procedure: INSERTION PORT-A-CATH;  Surgeon: Stark Klein, MD;  Location: [place];  Service: General;  Laterality: N/A;  . Incision and drainage Left 07/02/2014    Procedure: Drainage left breast seroma;  Surgeon: Stark Klein, MD;  Location: [place];  Service: General;  Laterality: Left;  . Evacuation breast hematoma Left 07/10/2014    Procedure: EVACUATION OF LEFT BREAST HEMATOMA ;  Surgeon: Stark Klein, MD;  Location: [facility];  Service: General;  Laterality: Left;    FAMILY HISTORY Family History  Problem Relation Age of Onset  . Breast cancer Mother 78    currently 9; TAH/BSO d/t ?cervical ca at 80  . Breast cancer Maternal Aunt     dx 49s; currently in her late 21s  . Lung cancer Father     deceased 46   the patient's father died at the age of 71 from lung cancer. The patient's mother was diagnosed with breast cancer at the age of 7. She survives. The patient had 2 brothers, and 2 sisters. One brother may have a diagnosis of "stomach cancer"  GYNECOLOGIC HISTORY:  No LMP recorded. Patient has had an ablation. Menarche age 63 which is also when the patient first had a child. She is GX P3. She stopped having periods after laser ablation in 2005.  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 At home she lives with her daughter Laura Matthews and her 4 children who are 9, 8, 5, and 4. 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 The patient's son Laura Matthews is a fast food cook in [place] and the patient's daughter Laura Matthews works as a Scientist, water quality, also in [place]. The patient has 7 grandchildren. She is not a church attender    ADVANCED DIRECTIVES: Not in place; this was discussed with the patient on her first visit, 05/21/2014, and she indicated she plans to name her daughter Laura Matthews as her healthcare power of attorney   HEALTH MAINTENANCE: History  Substance Use Topics  . Smoking status: Current Every  Day Smoker -- 0.25 packs/day    Types: Cigarettes  . Smokeless tobacco: Never Used  . Alcohol Use: No     Comment: Percoset,Vicodin, Oxycotin     Colonoscopy:  PAP:  Bone density:  Lipid panel:  Allergies  Allergen Reactions  . Norco [Hydrocodone-Acetaminophen] Itching and Nausea Only    Pt reported allergy.    Current Outpatient Prescriptions  Medication Sig Dispense Refill  . cephALEXin (KEFLEX) 500 MG capsule Take 1 capsule (500 mg total) by mouth 4 (four) times daily.  40 capsule  0  . dexamethasone (DECADRON) 4 MG tablet Take 2 tablets by mouth once a day on the day after chemotherapy and then take 2 tablets two times a day for 2 days. Take with food.  30 tablet  1  . ibuprofen (ADVIL,MOTRIN) 200 MG tablet Take 400 mg by mouth every 6 (six) hours as needed for moderate pain.      Marland Kitchen lidocaine-prilocaine (EMLA) cream Apply  1 application topically as needed.  30 g  0  . LORazepam (ATIVAN) 0.5 MG tablet Take 1 tablet (0.5 mg total) by mouth every 6 (six) hours as needed (Nausea or vomiting).  30 tablet  0  . ondansetron (ZOFRAN ODT) 8 MG disintegrating tablet Take 1 tablet (8 mg total) by mouth every 8 (eight) hours as needed for nausea or vomiting.  20 tablet  0  . ondansetron (ZOFRAN) 8 MG tablet Take 1 tablet (8 mg total) by mouth 2 (two) times daily as needed. Start on the third day after chemotherapy.  30 tablet  1  . oxyCODONE (OXY IR/ROXICODONE) 5 MG immediate release tablet Take 3-4 tablets (15-20 mg total) by mouth every 4 (four) hours as needed for moderate pain, severe pain or breakthrough pain.  30 tablet  0  . prochlorperazine (COMPAZINE) 10 MG tablet Take 1 tablet (10 mg total) by mouth every 6 (six) hours as needed (Nausea or vomiting).  30 tablet  1  . ranitidine (ZANTAC) 150 MG tablet Take 1 tablet (150 mg total) by mouth 2 (two) times daily.  60 tablet  0   No current facility-administered medications for this visit.    OBJECTIVE: Middle-aged [place] American  woman who appears stated age 48 Vitals:   07/29/14 1419  BP: 132/80  Pulse: 75  Temp: 98.5 F (36.9 C)  Resp: 18     Body mass index is 25.9 kg/(m^2).    ECOG FS:1 - Symptomatic but completely ambulatory  Sclerae unicteric, pupils equal and reactive Oropharynx clear and moist-- no thrush No cervical or supraclavicular adenopathy Lungs no rales or rhonchi Heart regular rate and rhythm Abd soft, nontender, positive bowel sounds MSK no focal spinal tenderness, no upper extremity lymphedema Neuro: nonfocal, well oriented, anxious affect Breasts: deferred   LAB RESULTS:  CMP     Component Value Date/Time   NA 139 07/29/2014 1318   NA 138 06/03/2014 1326   K 3.8 07/29/2014 1318   K 3.5* 06/03/2014 1326   CL 95* 06/03/2014 1326   CO2 30* 07/29/2014 1318   CO2 30 06/03/2014 1326   GLUCOSE 133 07/29/2014 1318   GLUCOSE 114* 06/03/2014 1326   BUN 7.4 07/29/2014 1318   BUN 14 06/03/2014 1326   CREATININE 0.7 07/29/2014 1318   CREATININE 0.63 06/03/2014 1326   CALCIUM 9.6 07/29/2014 1318   CALCIUM 10.2 06/03/2014 1326   PROT 7.6 07/29/2014 1318   PROT 8.6* 06/03/2014 1326   ALBUMIN 3.9 07/29/2014 1318   ALBUMIN 4.7 06/03/2014 1326   AST 17 07/29/2014 1318   AST 21 06/03/2014 1326   ALT 13 07/29/2014 1318   ALT 21 06/03/2014 1326   ALKPHOS 81 07/29/2014 1318   ALKPHOS 75 06/03/2014 1326   BILITOT 0.28 07/29/2014 1318   BILITOT 0.5 06/03/2014 1326   GFRNONAA >90 06/03/2014 1326   GFRAA >90 06/03/2014 1326    I No results found for this basename: SPEP,  UPEP,   kappa and lambda light chains    Lab Results  Component Value Date   WBC 8.5 07/29/2014   NEUTROABS 5.2 07/29/2014   HGB 11.8 07/29/2014   HCT 36.9 07/29/2014   MCV 90.2 07/29/2014   PLT 419* 07/29/2014      Chemistry      Component Value Date/Time   NA 139 07/29/2014 1318   NA 138 06/03/2014 1326   K 3.8 07/29/2014 1318   K 3.5* 06/03/2014 1326   CL 95* 06/03/2014 1326  CO2 30* 07/29/2014 1318   CO2 30 06/03/2014 1326   BUN 7.4 07/29/2014 1318   BUN  14 06/03/2014 1326   CREATININE 0.7 07/29/2014 1318   CREATININE 0.63 06/03/2014 1326      Component Value Date/Time   CALCIUM 9.6 07/29/2014 1318   CALCIUM 10.2 06/03/2014 1326   ALKPHOS 81 07/29/2014 1318   ALKPHOS 75 06/03/2014 1326   AST 17 07/29/2014 1318   AST 21 06/03/2014 1326   ALT 13 07/29/2014 1318   ALT 21 06/03/2014 1326   BILITOT 0.28 07/29/2014 1318   BILITOT 0.5 06/03/2014 1326       No results found for this basename: LABCA2    No components found with this basename: LABCA125    No results found for this basename: INR,  in the last 168 hours  Urinalysis    Component Value Date/Time   COLORURINE YELLOW 06/03/2014 [place] 06/03/2014 1050   LABSPEC >1.030* 06/03/2014 1050   PHURINE 6.0 06/03/2014 1050   GLUCOSEU NEGATIVE 06/03/2014 1050   HGBUR SMALL* 06/03/2014 1050   BILIRUBINUR SMALL* 06/03/2014 1050   KETONESUR NEGATIVE 06/03/2014 [place] 06/03/2014 1050   UROBILINOGEN 0.2 06/03/2014 1050   NITRITE NEGATIVE 06/03/2014 1050   LEUKOCYTESUR TRACE* 06/03/2014 1050    STUDIES: Chest Port 1 View  07/02/2014   CLINICAL DATA:  Postoperative from Port-A-Cath appliance insertion  EXAM: PORTABLE CHEST - 1 VIEW  COMPARISON:  PA and lateral chest of May 29, 2011  FINDINGS: The Port-A-Cath appliance tip lies in the midportion of the SVC. There is no postprocedure pneumothorax or hemo thorax. The lungs are adequately inflated and clear. The heart and mediastinal structures are within the limits of normal. There are numerous surgical clips in the left axillary region.  IMPRESSION: There is no postprocedure complication following Port-A-Cath appliance placement.   Electronically Signed   By: David  Martinique   On: 07/02/2014 11:24   Dg Fluoro Guide Cv Line-no Report  07/02/2014   CLINICAL DATA: portacath insertion   FLOURO GUIDE CV LINE  Fluoroscopy was utilized by the requesting physician.  No radiographic  interpretation.     ASSESSMENT: 48 y.o. [place] woman status post  left breast upper outer quadrant biopsy 05/09/2014 for a clinicalT1c N1, stage IIA invasive ductal carcinoma, grade 2, estrogen and progesterone receptor positive, HER-2 nonamplified, with an MIB-1 of 80%  (1) patient met with genetics counselor 05/29/2014 but decided against genetics testing   (2) tobacco abuse. The patient has been strongly urged to discontinue smoking.  (3) status post left lumpectomy and axillary lymph node dissection 06/11/2014 for a pT1c pN3, stage IIIC invasive ductal carcinoma, grade 3, with repeat HER-2 negative  (4) adjuvant chemotherapy to consist of doxorubicin and cyclophosphamide in dose dense fashion x4, with Neulasta support, to start 07/29/2014, to be followed by paclitaxel weekly x12  (5) adjuvant radiation to follow chemotherapy  (6) anti-estrogens for 5-10 years to follow radiation  PLAN: Casimira's frustration continues and is visible during her visit. She is ready for chemo to get started today, and we will indeed proceed. I asked about getting her funding for visits to the lymphedema clinic, and she refuses, stating that she is not going back. Per the patient, Dr. Barry Dienes is ok with her starting chemo this week and she is ready to get it over with.   Arihanna states that she understands the antiemetic schedule discussed last week. She will return next week for her nadir  visit and discussion of her symptoms. She understands and agrees with the plan. She has been encouraged to call with any issues that might arise before her next visit here.    Marcelino Duster, NP 07/29/2014

## 2014-07-30 ENCOUNTER — Telehealth: Payer: Self-pay | Admitting: *Deleted

## 2014-07-30 ENCOUNTER — Ambulatory Visit (HOSPITAL_BASED_OUTPATIENT_CLINIC_OR_DEPARTMENT_OTHER): Payer: Medicaid Other

## 2014-07-30 VITALS — BP 144/69 | HR 80 | Temp 98.1°F

## 2014-07-30 DIAGNOSIS — C773 Secondary and unspecified malignant neoplasm of axilla and upper limb lymph nodes: Secondary | ICD-10-CM

## 2014-07-30 DIAGNOSIS — C50919 Malignant neoplasm of unspecified site of unspecified female breast: Secondary | ICD-10-CM

## 2014-07-30 DIAGNOSIS — C50412 Malignant neoplasm of upper-outer quadrant of left female breast: Secondary | ICD-10-CM

## 2014-07-30 DIAGNOSIS — Z5189 Encounter for other specified aftercare: Secondary | ICD-10-CM

## 2014-07-30 MED ORDER — PEGFILGRASTIM INJECTION 6 MG/0.6ML
6.0000 mg | Freq: Once | SUBCUTANEOUS | Status: AC
  Administered 2014-07-30: 6 mg via SUBCUTANEOUS
  Filled 2014-07-30: qty 0.6

## 2014-07-30 NOTE — Telephone Encounter (Deleted)
Message copied by Jesse Fall on Wed Jul 30, 2014  2:17 PM ------      Message from: Cora Collum      Created: Tue Jul 29, 2014  3:12 PM      Regarding: Follow  up Chemo      Contact: [phone]       Ist time  Adrimycin and Cxytoxan ------

## 2014-07-30 NOTE — Telephone Encounter (Signed)
Per staff message and POF I have scheduled appts. Advised scheduler of appts. JMW  

## 2014-07-30 NOTE — Telephone Encounter (Signed)
Called pt to f/u on A/C RX done 07/29/14.  Pt reports a little nausea & nausea med helps but no other symptoms.  She was concerned about shot that she will get today.  Discussed neulasta side effects & reccommended that she take some tylenol before her injection & prn & to call if this doesn't help.  She denies vomiting, diarrhea, constipaiton, or fever.  She knows to call if any questions or concerns

## 2014-07-30 NOTE — Patient Instructions (Signed)
Pegfilgrastim injection What is this medicine? PEGFILGRASTIM (peg fil GRA stim) is a long-acting granulocyte colony-stimulating factor that stimulates the growth of neutrophils, a type of white blood cell important in the body's fight against infection. It is used to reduce the incidence of fever and infection in patients with certain types of cancer who are receiving chemotherapy that affects the bone marrow. This medicine may be used for other purposes; ask your health care provider or pharmacist if you have questions. COMMON BRAND NAME(S): Neulasta What should I tell my health care provider before I take this medicine? They need to know if you have any of these conditions: -latex allergy -ongoing radiation therapy -sickle cell disease -skin reactions to acrylic adhesives (On-Body Injector only) -an unusual or allergic reaction to pegfilgrastim, filgrastim, other medicines, foods, dyes, or preservatives -pregnant or trying to get pregnant -breast-feeding How should I use this medicine? This medicine is for injection under the skin. If you get this medicine at home, you will be taught how to prepare and give the pre-filled syringe or how to use the On-body Injector. Refer to the patient Instructions for Use for detailed instructions. Use exactly as directed. Take your medicine at regular intervals. Do not take your medicine more often than directed. It is important that you put your used needles and syringes in a special sharps container. Do not put them in a trash can. If you do not have a sharps container, call your pharmacist or healthcare provider to get one. Talk to your pediatrician regarding the use of this medicine in children. Special care may be needed. Overdosage: If you think you have taken too much of this medicine contact a poison control center or emergency room at once. NOTE: This medicine is only for you. Do not share this medicine with others. What if I miss a dose? It is  important not to miss your dose. Call your doctor or health care professional if you miss your dose. If you miss a dose due to an On-body Injector failure or leakage, a new dose should be administered as soon as possible using a single prefilled syringe for manual use. What may interact with this medicine? Interactions have not been studied. Give your health care provider a list of all the medicines, herbs, non-prescription drugs, or dietary supplements you use. Also tell them if you smoke, drink alcohol, or use illegal drugs. Some items may interact with your medicine. This list may not describe all possible interactions. Give your health care provider a list of all the medicines, herbs, non-prescription drugs, or dietary supplements you use. Also tell them if you smoke, drink alcohol, or use illegal drugs. Some items may interact with your medicine. What should I watch for while using this medicine? You may need blood work done while you are taking this medicine. If you are going to need a MRI, CT scan, or other procedure, tell your doctor that you are using this medicine (On-Body Injector only). What side effects may I notice from receiving this medicine? Side effects that you should report to your doctor or health care professional as soon as possible: -allergic reactions like skin rash, itching or hives, swelling of the face, lips, or tongue -dizziness -fever -pain, redness, or irritation at site where injected -pinpoint red spots on the skin -shortness of breath or breathing problems -stomach or side pain, or pain at the shoulder -swelling -tiredness -trouble passing urine Side effects that usually do not require medical attention (report to your doctor   or health care professional if they continue or are bothersome): -bone pain -muscle pain This list may not describe all possible side effects. Call your doctor for medical advice about side effects. You may report side effects to FDA at  1-800-FDA-1088. Where should I keep my medicine? Keep out of the reach of children. Store pre-filled syringes in a refrigerator between 2 and 8 degrees C (36 and 46 degrees F). Do not freeze. Keep in carton to protect from light. Throw away this medicine if it is left out of the refrigerator for more than 48 hours. Throw away any unused medicine after the expiration date. NOTE: This sheet is a summary. It may not cover all possible information. If you have questions about this medicine, talk to your doctor, pharmacist, or health care provider.  2015, Elsevier/Gold Standard. (2014-01-16 16:14:05)  

## 2014-08-05 ENCOUNTER — Encounter: Payer: Self-pay | Admitting: Nurse Practitioner

## 2014-08-05 ENCOUNTER — Other Ambulatory Visit (HOSPITAL_BASED_OUTPATIENT_CLINIC_OR_DEPARTMENT_OTHER): Payer: Medicaid Other

## 2014-08-05 ENCOUNTER — Ambulatory Visit (HOSPITAL_BASED_OUTPATIENT_CLINIC_OR_DEPARTMENT_OTHER): Payer: Medicaid Other | Admitting: Nurse Practitioner

## 2014-08-05 VITALS — BP 138/75 | HR 80 | Temp 97.5°F | Resp 18 | Ht 70.0 in | Wt 172.4 lb

## 2014-08-05 DIAGNOSIS — Z720 Tobacco use: Secondary | ICD-10-CM

## 2014-08-05 DIAGNOSIS — R12 Heartburn: Secondary | ICD-10-CM

## 2014-08-05 DIAGNOSIS — C50912 Malignant neoplasm of unspecified site of left female breast: Secondary | ICD-10-CM

## 2014-08-05 DIAGNOSIS — I890 Lymphedema, not elsewhere classified: Secondary | ICD-10-CM

## 2014-08-05 DIAGNOSIS — C50812 Malignant neoplasm of overlapping sites of left female breast: Secondary | ICD-10-CM

## 2014-08-05 DIAGNOSIS — C50412 Malignant neoplasm of upper-outer quadrant of left female breast: Secondary | ICD-10-CM

## 2014-08-05 DIAGNOSIS — C773 Secondary and unspecified malignant neoplasm of axilla and upper limb lymph nodes: Secondary | ICD-10-CM

## 2014-08-05 DIAGNOSIS — Z170 Estrogen receptor positive status [ER+]: Secondary | ICD-10-CM

## 2014-08-05 DIAGNOSIS — D709 Neutropenia, unspecified: Secondary | ICD-10-CM

## 2014-08-05 LAB — CBC WITH DIFFERENTIAL/PLATELET
BASO%: 0.1 % (ref 0.0–2.0)
Basophils Absolute: 0 10*3/uL (ref 0.0–0.1)
EOS%: 1.1 % (ref 0.0–7.0)
Eosinophils Absolute: 0 10*3/uL (ref 0.0–0.5)
HCT: 38.1 % (ref 34.8–46.6)
HEMOGLOBIN: 11.9 g/dL (ref 11.6–15.9)
LYMPH#: 0.3 10*3/uL — AB (ref 0.9–3.3)
LYMPH%: 24.9 % (ref 14.0–49.7)
MCH: 28.1 pg (ref 25.1–34.0)
MCHC: 31.3 g/dL — ABNORMAL LOW (ref 31.5–36.0)
MCV: 89.8 fL (ref 79.5–101.0)
MONO#: 0.3 10*3/uL (ref 0.1–0.9)
MONO%: 21.3 % — ABNORMAL HIGH (ref 0.0–14.0)
NEUT%: 52.6 % (ref 38.4–76.8)
NEUTROS ABS: 0.7 10*3/uL — AB (ref 1.5–6.5)
Platelets: 194 10*3/uL (ref 145–400)
RBC: 4.24 10*6/uL (ref 3.70–5.45)
RDW: 14.5 % (ref 11.2–14.5)
WBC: 1.4 10*3/uL — ABNORMAL LOW (ref 3.9–10.3)

## 2014-08-05 LAB — COMPREHENSIVE METABOLIC PANEL (CC13)
ALBUMIN: 3.8 g/dL (ref 3.5–5.0)
ALT: 15 U/L (ref 0–55)
AST: 10 U/L (ref 5–34)
Alkaline Phosphatase: 96 U/L (ref 40–150)
Anion Gap: 7 mEq/L (ref 3–11)
BUN: 11 mg/dL (ref 7.0–26.0)
CALCIUM: 10 mg/dL (ref 8.4–10.4)
CHLORIDE: 103 meq/L (ref 98–109)
CO2: 27 mEq/L (ref 22–29)
Creatinine: 0.7 mg/dL (ref 0.6–1.1)
Glucose: 196 mg/dl — ABNORMAL HIGH (ref 70–140)
POTASSIUM: 4.1 meq/L (ref 3.5–5.1)
Sodium: 138 mEq/L (ref 136–145)
TOTAL PROTEIN: 7.8 g/dL (ref 6.4–8.3)
Total Bilirubin: 0.24 mg/dL (ref 0.20–1.20)

## 2014-08-05 MED ORDER — OXYCODONE HCL 5 MG PO TABS: 15.0000 mg | ORAL_TABLET | ORAL | Status: DC | PRN

## 2014-08-05 MED ORDER — OMEPRAZOLE 40 MG PO CPDR: 40.0000 mg | DELAYED_RELEASE_CAPSULE | Freq: Every day | ORAL | Status: DC

## 2014-08-05 NOTE — Progress Notes (Signed)
[place]  [phone] [phone]     ID: Laura Matthews DOB: Jan 15, 1966  MR#: [id number]  GEZ#:[id number]  Patient Care Team: No Pcp Per Patient as PCP - General (General Practice) Stark Klein, MD as Consulting Physician (General Surgery) Chauncey Cruel, MD as Consulting Physician (Oncology) Thea Silversmith, MD as Consulting Physician (Radiation Oncology) [facility] Bunnie Pion, NP as Nurse Practitioner (Nurse Practitioner)  CHIEF COMPLAINT: Newly diagnosed breast cancer  CURRENT TREATMENT: to start adjuvant chemotherapy  BREAST CANCER HISTORY: From the original intake note:  The patient herself palpated a mass in her left breast and brought it to the attention of [facility] NP 04/22/2014. She palpated a large nontender mass at the 4:00 position as well as a small tender mass in the 6:00 position of the left breast. The patient was set up for imaging at the breast Center where on 05/05/2014 she underwent bilateral diagnostic mammography and left breast ultrasonography. The mammogram showed an irregular mass in the outer left breast with no other suspicious findings. This was firm and fixed at the 3:00 position approximately 10 cm from the nipple. Ultrasound showed a hypoechoic irregular solid mass in this area, measuring 1.8 cm. Ultrasound of the left axilla showed 2 suspicious level I lymph nodes, the largest measuring 1 cm, with a thickened pole. A smaller lymph node measured 0.6 cm but had no visible fatty hilum.  On 05/09/2014 the patient underwent biopsy of the left breast mass (she refused biopsy of the left axilla) which showed ([id number]) and invasive ductal carcinoma, grade 2, estrogen receptor positive at 100%, with strong staining intensity, progesterone receptor 80% positive, with weak staining intensity, with an MIB-1 of 80%, and no HER-2 amplification.  On 05/16/2014 the patient underwent bilateral breast MRI. This showed a 1.7 cm irregular  enhancing mass at the 3:00 position of the left breast associated with the biopsy cleft. There were 3 level I left axillary lymph nodes with thickened cortices. The largest measured 1.3 cm. There were no other findings of concern.  Her subsequent history is as detailed below.  INTERVAL HISTORY: Laura Matthews returns today for follow up of her breast cancer. Today is day 8, cycle 1 of doxorubicin and cyclophosphamide, with neulasta given on day 2 for granulocyte support. Overall, Laura Matthews stated her first treatment was "rough," but she tolerated it moderately well. She had some nausea extending into days 5 and 6. She has some bony aches and pains from the neulasta injection, but the claratin has helped. She denies fevers, chills, or changes in bowel or bladder habits.   REVIEW OF SYSTEMS: Laura Matthews's left breast and arm continue to ache. She quit her sessions at the lymphedema clinic because she could no longer afford to attend. She uses oxycodone PRN for this pain. A detailed review of systems is otherwise negative.   PAST MEDICAL HISTORY: Past Medical History  Diagnosis Date  . Mental disorder     depression-  . Arthritis   . Hot flashes   . Head ache   . Malignant neoplasm of breast (female), unspecified site   . Family history of malignant neoplasm of breast   . Depression     pt denies   . Anemia     low iron    PAST SURGICAL HISTORY: Past Surgical History  Procedure Laterality Date  . Knee surgery  1995    right  . Foot surgery  2011    rt  . Tubal ligation    .  Uterine fibroid surgery  2005    ablation  . Breast lumpectomy with sentinel lymph node biopsy Left 06/11/2014    Procedure: BREAST LUMPECTOMY WITH SENTINEL LYMPH NODE BX, POSSIBLE AXILLARY LYMPH NODE DISSECTION;  Surgeon: Stark Klein, MD;  Location: [place];  Service: General;  Laterality: Left;  . Portacath placement N/A 07/02/2014    Procedure: INSERTION PORT-A-CATH;  Surgeon: Stark Klein, MD;  Location: [place];  Service: General;  Laterality: N/A;  . Incision and drainage Left 07/02/2014    Procedure: Drainage left breast seroma;  Surgeon: Stark Klein, MD;  Location: [place];  Service: General;  Laterality: Left;  . Evacuation breast hematoma Left 07/10/2014    Procedure: EVACUATION OF LEFT BREAST HEMATOMA ;  Surgeon: Stark Klein, MD;  [facility];  Service: General;  Laterality: Left;    FAMILY HISTORY Family History  Problem Relation Age of Onset  . Breast cancer Mother 33    currently 82; TAH/BSO d/t ?cervical ca at 59  . Breast cancer Maternal Aunt     dx 9s; currently in her late 73s  . Lung cancer Father     deceased 12   the patient's father died at the age of 10 from lung cancer. The patient's mother was diagnosed with breast cancer at the age of 17. She survives. The patient had 2 brothers, and 2 sisters. One brother may have a diagnosis of "stomach cancer"  GYNECOLOGIC HISTORY:  No LMP recorded. Patient has had an ablation. Menarche age 86 which is also when the patient first had a child. She is GX P3. She stopped having periods after laser ablation in 2005.  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 At home she lives with her daughter Laura Matthews and her 4 children who are 70, 8, 5, and 4. 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 The patient's son Laura Matthews is a fast food cook in [place] and the patient's daughter Laura Matthews works as a Scientist, water quality, also in [place]. The patient has 7 grandchildren. She is not a church attender    ADVANCED DIRECTIVES: Not in place; this was discussed with the patient on her first visit, 05/21/2014, and she indicated she plans to name her daughter Laura Matthews as her healthcare power of attorney   HEALTH MAINTENANCE: History  Substance Use Topics  . Smoking status: Current Every Day Smoker -- 0.25 packs/day    Types: Cigarettes  . Smokeless tobacco: Never Used  .  Alcohol Use: No     Comment: Percoset,Vicodin, Oxycotin     Colonoscopy:  PAP:  Bone density:  Lipid panel:  Allergies  Allergen Reactions  . Norco [Hydrocodone-Acetaminophen] Itching and Nausea Only    Pt reported allergy.    Current Outpatient Prescriptions  Medication Sig Dispense Refill  . dexamethasone (DECADRON) 4 MG tablet Take 2 tablets by mouth once a day on the day after chemotherapy and then take 2 tablets two times a day for 2 days. Take with food.  30 tablet  1  . ibuprofen (ADVIL,MOTRIN) 200 MG tablet Take 400 mg by mouth every 6 (six) hours as needed for moderate pain.      Marland Kitchen lidocaine-prilocaine (EMLA) cream Apply 1 application topically as needed.  30 g  0  . LORazepam (ATIVAN) 0.5 MG tablet Take 1 tablet (0.5 mg total) by mouth every 6 (six) hours as needed (Nausea or vomiting).  30 tablet  0  .  ondansetron (ZOFRAN) 8 MG tablet Take 1 tablet (8 mg total) by mouth 2 (two) times daily as needed. Start on the third day after chemotherapy.  30 tablet  1  . prochlorperazine (COMPAZINE) 10 MG tablet Take 1 tablet (10 mg total) by mouth every 6 (six) hours as needed (Nausea or vomiting).  30 tablet  1  . cephALEXin (KEFLEX) 500 MG capsule Take 1 capsule (500 mg total) by mouth 4 (four) times daily.  40 capsule  0  . omeprazole (PRILOSEC) 40 MG capsule Take 1 capsule (40 mg total) by mouth daily.  30 capsule  2  . ondansetron (ZOFRAN ODT) 8 MG disintegrating tablet Take 1 tablet (8 mg total) by mouth every 8 (eight) hours as needed for nausea or vomiting.  20 tablet  0  . oxyCODONE (OXY IR/ROXICODONE) 5 MG immediate release tablet Take 3-4 tablets (15-20 mg total) by mouth every 4 (four) hours as needed for moderate pain, severe pain or breakthrough pain.  30 tablet  0  . ranitidine (ZANTAC) 150 MG tablet Take 1 tablet (150 mg total) by mouth 2 (two) times daily.  60 tablet  0   No current facility-administered medications for this visit.   Facility-Administered Medications  Ordered in Other Visits  Medication Dose Route Frequency Provider Last Rate Last Dose  . sodium chloride 0.9 % injection 10 mL  10 mL Intracatheter PRN Chauncey Cruel, MD   10 mL at 07/29/14 1704    OBJECTIVE: Middle-aged [place] American woman who appears stated age 73 Vitals:   08/05/14 1355  BP: 138/75  Pulse: 80  Temp: 97.5 F (36.4 C)  Resp: 18     Body mass index is 24.74 kg/(m^2).    ECOG FS:1 - Symptomatic but completely ambulatory  Skin: warm, dry  HEENT: sclerae anicteric, conjunctivae pink, oropharynx clear. No thrush or mucositis.  Lymph Nodes: No cervical or supraclavicular lymphadenopathy  Lungs: clear to auscultation bilaterally, no rales, wheezes, or rhonci  Heart: regular rate and rhythm  Abdomen: round, soft, non tender, positive bowel sounds  Musculoskeletal: No focal spinal tenderness, mild left upper extremity lymphedema, ROM limited while reaching behind her back Neuro: non focal, well oriented, positive affect  Breasts: deferred  LAB RESULTS:  CMP     Component Value Date/Time   NA 138 08/05/2014 1342   NA 138 06/03/2014 1326   K 4.1 08/05/2014 1342   K 3.5* 06/03/2014 1326   CL 95* 06/03/2014 1326   CO2 27 08/05/2014 1342   CO2 30 06/03/2014 1326   GLUCOSE 196* 08/05/2014 1342   GLUCOSE 114* 06/03/2014 1326   BUN 11.0 08/05/2014 1342   BUN 14 06/03/2014 1326   CREATININE 0.7 08/05/2014 1342   CREATININE 0.63 06/03/2014 1326   CALCIUM 10.0 08/05/2014 1342   CALCIUM 10.2 06/03/2014 1326   PROT 7.8 08/05/2014 1342   PROT 8.6* 06/03/2014 1326   ALBUMIN 3.8 08/05/2014 1342   ALBUMIN 4.7 06/03/2014 1326   AST 10 08/05/2014 1342   AST 21 06/03/2014 1326   ALT 15 08/05/2014 1342   ALT 21 06/03/2014 1326   ALKPHOS 96 08/05/2014 1342   ALKPHOS 75 06/03/2014 1326   BILITOT 0.24 08/05/2014 1342   BILITOT 0.5 06/03/2014 1326   GFRNONAA >90 06/03/2014 1326   GFRAA >90 06/03/2014 1326    I No results found for this basename: SPEP,  UPEP,   kappa and lambda light chains    Lab  Results  Component Value Date  WBC 1.4* 08/05/2014   NEUTROABS 0.7* 08/05/2014   HGB 11.9 08/05/2014   HCT 38.1 08/05/2014   MCV 89.8 08/05/2014   PLT 194 08/05/2014      Chemistry      Component Value Date/Time   NA 138 08/05/2014 1342   NA 138 06/03/2014 1326   K 4.1 08/05/2014 1342   K 3.5* 06/03/2014 1326   CL 95* 06/03/2014 1326   CO2 27 08/05/2014 1342   CO2 30 06/03/2014 1326   BUN 11.0 08/05/2014 1342   BUN 14 06/03/2014 1326   CREATININE 0.7 08/05/2014 1342   CREATININE 0.63 06/03/2014 1326      Component Value Date/Time   CALCIUM 10.0 08/05/2014 1342   CALCIUM 10.2 06/03/2014 1326   ALKPHOS 96 08/05/2014 1342   ALKPHOS 75 06/03/2014 1326   AST 10 08/05/2014 1342   AST 21 06/03/2014 1326   ALT 15 08/05/2014 1342   ALT 21 06/03/2014 1326   BILITOT 0.24 08/05/2014 1342   BILITOT 0.5 06/03/2014 1326       No results found for this basename: LABCA2    No components found with this basename: LABCA125    No results found for this basename: INR,  in the last 168 hours  Urinalysis    Component Value Date/Time   COLORURINE YELLOW 06/03/2014 [place] 06/03/2014 1050   LABSPEC >1.030* 06/03/2014 1050   PHURINE 6.0 06/03/2014 1050   GLUCOSEU NEGATIVE 06/03/2014 1050   HGBUR SMALL* 06/03/2014 1050   BILIRUBINUR SMALL* 06/03/2014 1050   KETONESUR NEGATIVE 06/03/2014 [place] 06/03/2014 1050   UROBILINOGEN 0.2 06/03/2014 1050   NITRITE NEGATIVE 06/03/2014 1050   LEUKOCYTESUR TRACE* 06/03/2014 1050    STUDIES: Chest Port 1 View  07/02/2014   CLINICAL DATA:  Postoperative from Port-A-Cath appliance insertion  EXAM: PORTABLE CHEST - 1 VIEW  COMPARISON:  PA and lateral chest of May 29, 2011  FINDINGS: The Port-A-Cath appliance tip lies in the midportion of the SVC. There is no postprocedure pneumothorax or hemo thorax. The lungs are adequately inflated and clear. The heart and mediastinal structures are within the limits of normal. There are numerous surgical clips in the left axillary  region.  IMPRESSION: There is no postprocedure complication following Port-A-Cath appliance placement.   Electronically Signed   By: David  Martinique   On: 07/02/2014 11:24   Dg Fluoro Guide Cv Line-no Report  07/02/2014   CLINICAL DATA: portacath insertion   FLOURO GUIDE CV LINE  Fluoroscopy was utilized by the requesting physician.  No radiographic  interpretation.     ASSESSMENT: 48 y.o. [place] woman status post left breast upper outer quadrant biopsy 05/09/2014 for a clinicalT1c N1, stage IIA invasive ductal carcinoma, grade 2, estrogen and progesterone receptor positive, HER-2 nonamplified, with an MIB-1 of 80%  (1) patient met with genetics counselor 05/29/2014 but decided against genetics testing   (2) tobacco abuse. The patient has been strongly urged to discontinue smoking.  (3) status post left lumpectomy and axillary lymph node dissection 06/11/2014 for a pT1c pN3, stage IIIC invasive ductal carcinoma, grade 3, with repeat HER-2 negative  (4) adjuvant chemotherapy to consist of doxorubicin and cyclophosphamide in dose dense fashion x4, with Neulasta support, to start 07/29/2014, to be followed by paclitaxel weekly x12  (5) adjuvant radiation to follow chemotherapy  (6) anti-estrogens for 5-10 years to follow radiation  PLAN: During our 30 minute visit, I reviewed the anti-emetic schedule with [name] again. For the  most part I think she had the hang of it, but she may need to extend her use of the compazine a few days past day 3, and could stand to utilize her zofran more often. She is having some heartburn, so I wrote her a prescription to begin 48m omeprazole daily.   The labs were reviewed in detail, and while she is neutropenic, all values were in expected ranges during active chemotherapy. We discussed proper hand hygiene, avoiding crowds, and she will notify u[place]of a temperature above 100.2.   I have our nurse NRea Collegeworking on financial assistance for KBriato return to  the lymphedema clinic. The therapist at this clinic also wrote a note stating that she may be able to qualify for a compression pump if we are not able to get her any more visits.   KYazlynwill return next week for the start of cycle 2. She understands and agrees with this plan. She knows the goal of treatment in her case is cure. She has been encouraged to call with any issues that might arise before her next visit here.    FMarcelino Duster NP 08/05/2014

## 2014-08-11 ENCOUNTER — Emergency Department (HOSPITAL_COMMUNITY)
Admission: EM | Admit: 2014-08-11 | Discharge: 2014-08-11 | Disposition: A | Discharge status: Home / Self Care | Payer: Medicaid Other | Attending: Emergency Medicine | Admitting: Emergency Medicine

## 2014-08-11 ENCOUNTER — Encounter (HOSPITAL_COMMUNITY): Payer: Self-pay | Admitting: Emergency Medicine

## 2014-08-11 ENCOUNTER — Ambulatory Visit: Payer: Medicaid Other | Attending: General Surgery | Admitting: Physical Therapy

## 2014-08-11 DIAGNOSIS — M1990 Unspecified osteoarthritis, unspecified site: Secondary | ICD-10-CM | POA: Diagnosis not present

## 2014-08-11 DIAGNOSIS — I890 Lymphedema, not elsewhere classified: Secondary | ICD-10-CM

## 2014-08-11 DIAGNOSIS — Z862 Personal history of diseases of the blood and blood-forming organs and certain disorders involving the immune mechanism: Secondary | ICD-10-CM | POA: Insufficient documentation

## 2014-08-11 DIAGNOSIS — C50912 Malignant neoplasm of unspecified site of left female breast: Secondary | ICD-10-CM

## 2014-08-11 DIAGNOSIS — Z8659 Personal history of other mental and behavioral disorders: Secondary | ICD-10-CM | POA: Insufficient documentation

## 2014-08-11 DIAGNOSIS — Z79899 Other long term (current) drug therapy: Secondary | ICD-10-CM | POA: Insufficient documentation

## 2014-08-11 DIAGNOSIS — Z720 Tobacco use: Secondary | ICD-10-CM | POA: Diagnosis not present

## 2014-08-11 DIAGNOSIS — Z853 Personal history of malignant neoplasm of breast: Secondary | ICD-10-CM | POA: Insufficient documentation

## 2014-08-11 DIAGNOSIS — N649 Disorder of breast, unspecified: Secondary | ICD-10-CM | POA: Diagnosis present

## 2014-08-11 DIAGNOSIS — I972 Postmastectomy lymphedema syndrome: Secondary | ICD-10-CM | POA: Insufficient documentation

## 2014-08-11 DIAGNOSIS — C773 Secondary and unspecified malignant neoplasm of axilla and upper limb lymph nodes: Secondary | ICD-10-CM

## 2014-08-11 MED ORDER — OXYCODONE HCL 5 MG PO TABS
15.0000 mg | ORAL_TABLET | ORAL | Status: DC | PRN
Start: 2014-08-11 — End: 2014-08-26

## 2014-08-11 NOTE — ED Provider Notes (Signed)
Medical screening examination/treatment/procedure(s) were conducted as a shared visit with non-physician practitioner(s) and myself.  I personally evaluated the patient during the encounter.   EKG Interpretation None      I interviewed and examined the patient. Lungs are CTAB. Cardiac exam wnl. Abdomen soft.  Lymphedema of left breast w/ peau d'orange appearance. Surgical scar well healed. No erythema or cutaneous evidence of infection. Offered [place] to evaluate breast although low suspicion for infection. Pt declined. Will provide Rx for better pain control.   Pamella Pert, MD 08/11/14 2009

## 2014-08-11 NOTE — ED Provider Notes (Signed)
CSN: [id number]     Arrival date & time 08/11/14  1030 History   First MD Initiated Contact with Patient 08/11/14 1413     Chief Complaint  Patient presents with  . Breast Problem     (Consider location/radiation/quality/duration/timing/severity/associated sxs/prior Treatment) HPI  48 year old female with hx of breast cancer s/p breast lumpectomy with sentinal lymph node biopsy c/o L breast pain at the incisional site.  Pt has surgery in August and September.  The august surgery was for lumpectomy and on September 10, another surgery due to hematoma complication from previous surgery.  Following that surgery report increase pain and swelling to L breast, progressively worsen.  Denies any swelling to arm.  Has been taking OTC meds with minimal relief.  Has been attending her chemotherapy as scheduled, next appointment is tomorrow.  No fever, chills, but does report nausea/vomiting likely from chemo.  No SOB, productive cough or hemoptysis.    Past Medical History  Diagnosis Date  . Mental disorder     depression-  . Arthritis   . Hot flashes   . Head ache   . Malignant neoplasm of breast (female), unspecified site   . Family history of malignant neoplasm of breast   . Depression     pt denies   . Anemia     low iron   Past Surgical History  Procedure Laterality Date  . Knee surgery  1995    right  . Foot surgery  2011    rt  . Tubal ligation    . Uterine fibroid surgery  2005    ablation  . Breast lumpectomy with sentinel lymph node biopsy Left 06/11/2014    Procedure: BREAST LUMPECTOMY WITH SENTINEL LYMPH NODE BX, POSSIBLE AXILLARY LYMPH NODE DISSECTION;  Surgeon: Stark Klein, MD;  Location: [place];  Service: General;  Laterality: Left;  . Portacath placement N/A 07/02/2014    Procedure: INSERTION PORT-A-CATH;  Surgeon: Stark Klein, MD;  Location: [place];  Service: General;  Laterality: N/A;  . Incision and drainage Left 07/02/2014   Procedure: Drainage left breast seroma;  Surgeon: Stark Klein, MD;  Location: [place];  Service: General;  Laterality: Left;  . Evacuation breast hematoma Left 07/10/2014    Procedure: EVACUATION OF LEFT BREAST HEMATOMA ;  Surgeon: Stark Klein, MD;  [facility];  Service: General;  Laterality: Left;   Family History  Problem Relation Age of Onset  . Breast cancer Mother 20    currently 27; TAH/BSO d/t ?cervical ca at 68  . Breast cancer Maternal Aunt     dx 79s; currently in her late 36s  . Lung cancer Father     deceased 35   History  Substance Use Topics  . Smoking status: Current Every Day Smoker -- 0.25 packs/day    Types: Cigarettes  . Smokeless tobacco: Never Used  . Alcohol Use: No     Comment: Percoset,Vicodin, Oxycotin   OB History   Grav Para Term Preterm Abortions TAB SAB Ect Mult Living   3 3 3       3      Review of Systems  All other systems reviewed and are negative.     Allergies  Norco  Home Medications   Prior to Admission medications   Medication Sig Start Date End Date Taking? Authorizing Provider  dexamethasone (DECADRON) 4 MG tablet Take 2 tablets by mouth once a day on the day after chemotherapy and then  take 2 tablets two times a day for 2 days. Take with food. 07/15/14   Marcelino Duster, NP  ibuprofen (ADVIL,MOTRIN) 200 MG tablet Take 400 mg by mouth every 6 (six) hours as needed for moderate pain.    Historical Provider, MD  lidocaine-prilocaine (EMLA) cream Apply 1 application topically as needed. 07/15/14   Marcelino Duster, NP  LORazepam (ATIVAN) 0.5 MG tablet Take 1 tablet (0.5 mg total) by mouth every 6 (six) hours as needed (Nausea or vomiting). 07/15/14   Chauncey Cruel, MD  omeprazole (PRILOSEC) 40 MG capsule Take 1 capsule (40 mg total) by mouth daily. 08/05/14   Marcelino Duster, NP  ondansetron (ZOFRAN ODT) 8 MG disintegrating tablet Take 1 tablet (8 mg total) by mouth every 8 (eight) hours as needed for nausea  or vomiting. 06/03/14   Hope Bunnie Pion, NP  ondansetron (ZOFRAN) 8 MG tablet Take 1 tablet (8 mg total) by mouth 2 (two) times daily as needed. Start on the third day after chemotherapy. 07/25/14   Chauncey Cruel, MD  oxyCODONE (OXY IR/ROXICODONE) 5 MG immediate release tablet Take 3-4 tablets (15-20 mg total) by mouth every 4 (four) hours as needed for moderate pain, severe pain or breakthrough pain. 08/05/14   Marcelino Duster, NP  prochlorperazine (COMPAZINE) 10 MG tablet Take 1 tablet (10 mg total) by mouth every 6 (six) hours as needed (Nausea or vomiting). 07/15/14   Marcelino Duster, NP  ranitidine (ZANTAC) 150 MG tablet Take 1 tablet (150 mg total) by mouth 2 (two) times daily. 06/03/14   Hope Bunnie Pion, NP   BP 134/82  Pulse 77  Temp(Src) 98.6 F (37 C) (Oral)  Resp 17  Ht 5\' 10"  (1.778 m)  Wt 180 lb (81.647 kg)  BMI 25.83 kg/m2  SpO2 99% Physical Exam  Nursing note and vitals reviewed. Constitutional: She appears well-developed and well-nourished. No distress.  HENT:  Head: Atraumatic.  Eyes: Conjunctivae are normal.  Neck: Neck supple.  Cardiovascular: Normal rate and regular rhythm.   Pulmonary/Chest: Effort normal and breath sounds normal. She exhibits tenderness (L breast significantly larger than R breast, with generalized tenderness but without erythema/induration/fluctuance.  peau d'orange appearance.  ).  Abdominal: Soft.  Neurological: She is alert.  Skin: No rash noted.  Psychiatric: She has a normal mood and affect.    ED Course  Procedures (including critical care time)  2:48 PM Pt with L breast tenderness and edematous, likely 2/2 lymphedema.  This has been persistent x 1 month.  Pt is frustrated both with physical and mental stress from dealing with cancer.  Currently receiving chemotherapy.  Was receiving care through surgeon Dr. Barry Dienes who felt her sxs is due to lymphedema.  Pt does f/u with oncologist Dr. [name].  At this time, low suspicion for abscess or  cellulitis.  However we did offer to perform breast US for further evaluation, pt declined.  Pt agrees to f/u closely with her oncologist for further care.  Her next visit to oncology is next week.  Return precaution discussed.  Will prescribe pain medication for sxs control.    Labs Review Labs Reviewed - No data to display  Imaging Review No results found.   EKG Interpretation None      MDM   Final diagnoses:  Lymphedema of breast    BP 134/82  Pulse 77  Temp(Src) 98.6 F (37 C) (Oral)  Resp 17  Ht 5\' 10"  (1.778 m)  Wt 180 lb (81.647  kg)  BMI 25.83 kg/m2  SpO2 99%     Domenic Moras, PA-C 08/11/14 1452

## 2014-08-11 NOTE — ED Notes (Signed)
Pt comfortable with discharge and follow up instructions. Pt declines wheelchair, escorted to waiting area by this RN. Prescriptions x1. 

## 2014-08-11 NOTE — Discharge Instructions (Signed)
Please follow up closely with your oncologist, Dr. [name] for further management of your breast pain.  Take pain medication as needed for pain.  Return if you have any concerns.    Lymphedema Lymphedema is a swelling caused by the abnormal collection of lymph under the skin. The lymph is fluid from the tissues in your body that travels in the lymphatic system. This system is part of the immune system that includes lymph nodes and vessels. The lymph vessels collect and carry the excess fluid, fats, proteins, and wastes from the tissues of the body to the bloodstream. This system also works to clean and remove bacteria and waste products from the body.  Lymphedema occurs when the lymphatic system is blocked. When the lymph vessels or lymph nodes are blocked or damaged, lymph does not drain properly. This causes abnormal build up of lymph. This leads to swelling in the arms or legs. Lymphedema cannot be cured by medicines. But the swelling can be reduced by physical methods. CAUSES  There are two types of lymphedema. Primary lymphedema is caused by the absence or abnormality of the lymph vessel at birth. It is also known as inherited lymphedema, which occurs rarely. Secondary or acquired lymphedema occurs when the lymph vessel is damaged or blocked. The causes of lymph vessel blockage are:   Skin infection like cellulites.  Infection by parasites (filariasis).  Injury.  Cancer.  Radiation therapy.  Formation of scar tissue.  Surgery. SYMPTOMS  The symptoms of lymphedema are:  Abnormal swelling of the arm or leg.  Heavy or tight feeling in your arm or leg.  Tight-fitting shoes or rings.  Redness of skin over the affected area.  Limited movement of the affected limb.  Some patients complain about sensitivity to touch and discomfort in the limb(s) affected. You may not have these symptoms immediately following injury. They usually appear within a few days or even years after injury.  Inform your caregiver, if you have any of these symptoms. Early treatment can avoid further problems.  DIAGNOSIS  First, your caregiver will inquire about any surgery you have had or medicines you are taking. He will then examine you. Your caregiver may order special imaging tests, such as:  Lymphoscintigraphy (a test in which a low dose of radioactive substance is injected to trace the flow of lymph through the lymph vessels).  MRI (imaging tests using magnetic fields).  Computed tomography (test using special cross-sectional X-rays).  Duplex ultrasound (test using high-frequency sound waves to show the vessels and the blood flow on a screen).  Lymphangiography (special X-ray taken after injecting a contrast dye into the lymph vessel). It is now rarely done. TREATMENT  Lymphedema can be treated in different ways. Your caregiver will decide the type of treatment depending on the cause. Treatment may include:  Exercise: Special exercises will help fluid move out easily from the affected part. This should be done as per your caregiver's advice.  Manual lymph drainage: Gentle massage of the affected limb makes the fluid to move out more freely.  Compression: Compression stockings or external pump apply pressure over the affected limb. This helps the fluid to move out from the arm or leg. Bandaging can also help to move the fluid out from the affected part. Your caregiver will decide the method that suits you the best.  Medicines: Your caregiver may prescribe antibiotics, if you have infection.  Surgery: Your caregiver may advise surgery for severe lymphedema. It is reserved for special cases when the  patient has difficulty moving. Your surgeon may remove excess tissue from the arm or leg. This will help to ease your movement. Physical therapy may have to be continued after surgery. HOME CARE INSTRUCTIONS  The area is very fragile and is predisposed to injury and infection.  Eat a healthy  diet.  Exercise regularly as per advice.  Keep the affected area clean and dry.  Use gloves while cooking or gardening.  Protect your skin from cuts.  Use electric razor to shave the affected area.  Keep affected limb elevated.  Do not wear tight clothes, shoes, or jewelry as it may cause the tissue to be strangled.  Do not use heat pads over the affected area.  Do not sit with cross legs.  Do not walk barefoot.  Do not carry weight on the affected arm.  Avoid having blood pressure checked on the affected limb. SEEK MEDICAL CARE IF:  You continue to have swelling in your limb. SEEK IMMEDIATE MEDICAL CARE IF:   You have high fever.  You have skin rash.  You have chills or sweats.  You have pain or redness.  You have a cut that does not heal. MAKE SURE YOU:   Understand these instructions.  Will watch your condition.  Will get help right away if you are not doing well or get worse. Document Released: 08/14/2007 Document Revised: 10/03/2012 Document Reviewed: 07/20/2009 [facility] Patient Information 2015 [place], [place]. This information is not intended to replace advice given to you by your health care provider. Make sure you discuss any questions you have with your health care provider.

## 2014-08-11 NOTE — ED Notes (Signed)
Pt reports breast issue to left breast. Pt had surgery in August and September. Pt reports continues to have pain and swelling.

## 2014-08-12 ENCOUNTER — Ambulatory Visit (HOSPITAL_BASED_OUTPATIENT_CLINIC_OR_DEPARTMENT_OTHER): Payer: Medicaid Other

## 2014-08-12 ENCOUNTER — Ambulatory Visit (HOSPITAL_BASED_OUTPATIENT_CLINIC_OR_DEPARTMENT_OTHER): Payer: Medicaid Other | Admitting: Nurse Practitioner

## 2014-08-12 ENCOUNTER — Other Ambulatory Visit (HOSPITAL_BASED_OUTPATIENT_CLINIC_OR_DEPARTMENT_OTHER): Payer: Medicaid Other

## 2014-08-12 ENCOUNTER — Other Ambulatory Visit: Payer: Self-pay | Admitting: Oncology

## 2014-08-12 VITALS — BP 125/78 | HR 79 | Temp 99.1°F | Resp 18 | Ht 70.0 in | Wt 177.9 lb

## 2014-08-12 DIAGNOSIS — C773 Secondary and unspecified malignant neoplasm of axilla and upper limb lymph nodes: Secondary | ICD-10-CM

## 2014-08-12 DIAGNOSIS — Z170 Estrogen receptor positive status [ER+]: Secondary | ICD-10-CM

## 2014-08-12 DIAGNOSIS — C50412 Malignant neoplasm of upper-outer quadrant of left female breast: Secondary | ICD-10-CM

## 2014-08-12 DIAGNOSIS — C50919 Malignant neoplasm of unspecified site of unspecified female breast: Secondary | ICD-10-CM

## 2014-08-12 DIAGNOSIS — M79602 Pain in left arm: Secondary | ICD-10-CM

## 2014-08-12 DIAGNOSIS — C50812 Malignant neoplasm of overlapping sites of left female breast: Secondary | ICD-10-CM

## 2014-08-12 DIAGNOSIS — I890 Lymphedema, not elsewhere classified: Secondary | ICD-10-CM

## 2014-08-12 DIAGNOSIS — Z5111 Encounter for antineoplastic chemotherapy: Secondary | ICD-10-CM

## 2014-08-12 DIAGNOSIS — C50912 Malignant neoplasm of unspecified site of left female breast: Secondary | ICD-10-CM

## 2014-08-12 LAB — CBC WITH DIFFERENTIAL/PLATELET
BASO%: 0.2 % (ref 0.0–2.0)
Basophils Absolute: 0 10*3/uL (ref 0.0–0.1)
EOS%: 0.2 % (ref 0.0–7.0)
Eosinophils Absolute: 0 10*3/uL (ref 0.0–0.5)
HEMATOCRIT: 34.1 % — AB (ref 34.8–46.6)
HGB: 11.1 g/dL — ABNORMAL LOW (ref 11.6–15.9)
LYMPH%: 25.2 % (ref 14.0–49.7)
MCH: 28.7 pg (ref 25.1–34.0)
MCHC: 32.6 g/dL (ref 31.5–36.0)
MCV: 88.1 fL (ref 79.5–101.0)
MONO#: 0.8 10*3/uL (ref 0.1–0.9)
MONO%: 7.2 % (ref 0.0–14.0)
NEUT#: 7.3 10*3/uL — ABNORMAL HIGH (ref 1.5–6.5)
NEUT%: 67.2 % (ref 38.4–76.8)
PLATELETS: 257 10*3/uL (ref 145–400)
RBC: 3.87 10*6/uL (ref 3.70–5.45)
RDW: 14.4 % (ref 11.2–14.5)
WBC: 10.9 10*3/uL — ABNORMAL HIGH (ref 3.9–10.3)
lymph#: 2.7 10*3/uL (ref 0.9–3.3)
nRBC: 0 % (ref 0–0)

## 2014-08-12 MED ORDER — DEXAMETHASONE SODIUM PHOSPHATE 20 MG/5ML IJ SOLN
12.0000 mg | Freq: Once | INTRAMUSCULAR | Status: AC
  Administered 2014-08-12: 12 mg via INTRAVENOUS

## 2014-08-12 MED ORDER — HEPARIN SOD (PORK) LOCK FLUSH 100 UNIT/ML IV SOLN
500.0000 [IU] | Freq: Once | INTRAVENOUS | Status: AC | PRN
  Administered 2014-08-12: 500 [IU]
  Filled 2014-08-12: qty 5

## 2014-08-12 MED ORDER — SODIUM CHLORIDE 0.9 % IV SOLN
150.0000 mg | Freq: Once | INTRAVENOUS | Status: AC
  Administered 2014-08-12: 150 mg via INTRAVENOUS
  Filled 2014-08-12: qty 5

## 2014-08-12 MED ORDER — SODIUM CHLORIDE 0.9 % IJ SOLN
10.0000 mL | INTRAMUSCULAR | Status: DC | PRN
  Administered 2014-08-12: 10 mL
  Filled 2014-08-12: qty 10

## 2014-08-12 MED ORDER — PALONOSETRON HCL INJECTION 0.25 MG/5ML
0.2500 mg | Freq: Once | INTRAVENOUS | Status: AC
  Administered 2014-08-12: 0.25 mg via INTRAVENOUS

## 2014-08-12 MED ORDER — DEXAMETHASONE SODIUM PHOSPHATE 20 MG/5ML IJ SOLN
INTRAMUSCULAR | Status: AC
  Filled 2014-08-12: qty 5

## 2014-08-12 MED ORDER — PALONOSETRON HCL INJECTION 0.25 MG/5ML
INTRAVENOUS | Status: AC
  Filled 2014-08-12: qty 5

## 2014-08-12 MED ORDER — SODIUM CHLORIDE 0.9 % IV SOLN
600.0000 mg/m2 | Freq: Once | INTRAVENOUS | Status: AC
  Administered 2014-08-12: 1180 mg via INTRAVENOUS
  Filled 2014-08-12: qty 59

## 2014-08-12 MED ORDER — DOXORUBICIN HCL CHEMO IV INJECTION 2 MG/ML
60.0000 mg/m2 | Freq: Once | INTRAVENOUS | Status: AC
  Administered 2014-08-12: 118 mg via INTRAVENOUS
  Filled 2014-08-12: qty 59

## 2014-08-12 MED ORDER — SODIUM CHLORIDE 0.9 % IV SOLN: Freq: Once | INTRAVENOUS | Status: DC

## 2014-08-12 NOTE — Progress Notes (Signed)
[facility]  [phone] Fax:([phone]) [phone]     ID: [name] DOB: 01/24/1966  MR#: [id number]  LAG#:[id number]  Patient Care Team: No Pcp Per Patient as PCP - General (General Practice) Stark Klein, MD as Consulting Physician (General Surgery) Chauncey Cruel, MD as Consulting Physician (Oncology) Thea Silversmith, MD as Consulting Physician (Radiation Oncology) [facility] Bunnie Pion, NP as Nurse Practitioner (Nurse Practitioner)  CHIEF COMPLAINT: Newly diagnosed breast cancer  CURRENT TREATMENT: to start adjuvant chemotherapy  BREAST CANCER HISTORY: From the original intake note:  The patient herself palpated a mass in her left breast and brought it to the attention of [facility] NP 04/22/2014. She palpated a large nontender mass at the 4:00 position as well as a small tender mass in the 6:00 position of the left breast. The patient was set up for imaging at the breast Center where on 05/05/2014 she underwent bilateral diagnostic mammography and left breast ultrasonography. The mammogram showed an irregular mass in the outer left breast with no other suspicious findings. This was firm and fixed at the 3:00 position approximately 10 cm from the nipple. Ultrasound showed a hypoechoic irregular solid mass in this area, measuring 1.8 cm. Ultrasound of the left axilla showed 2 suspicious level I lymph nodes, the largest measuring 1 cm, with a thickened pole. A smaller lymph node measured 0.6 cm but had no visible fatty hilum.  On 05/09/2014 the patient underwent biopsy of the left breast mass (she refused biopsy of the left axilla) which showed ([id number]) and invasive ductal carcinoma, grade 2, estrogen receptor positive at 100%, with strong staining intensity, progesterone receptor 80% positive, with weak staining intensity, with an MIB-1 of 80%, and no HER-2 amplification.  On 05/16/2014 the patient underwent bilateral breast MRI. This showed a 1.7 cm irregular  enhancing mass at the 3:00 position of the left breast associated with the biopsy cleft. There were 3 level I left axillary lymph nodes with thickened cortices. The largest measured 1.3 cm. There were no other findings of concern.  Her subsequent history is as detailed below.  INTERVAL HISTORY: Laura returns for follow up of her breast cancer, accompanied by one of her daughters. Today is day 1, cycle 2 of doxorubicin and cyclophosphamide. Keneshia was supposed to show up for therapy at the lymphedema clinic on Monday, but instead visited the emergency department with breast pain and swelling. Her left arm also continue to ache. Today she feels better, but she has not rescheduled with our physical therapist, who states that she is approved for a compression pump for use at home. She continues on oxycodone PRN for pain, and taking them "like skittles." She claims they do not help with the pain.  REVIEW OF SYSTEMS:  Valerye denies fevers or chills. She had some nausea early last week, but is taking her PRN antiemetics as prescribed. Her nails are becoming hyperpigmented. She denies shortness of breath, cough, fatigue, or palpitations. A detailed review of systems is otherwise noncontributory.   PAST MEDICAL HISTORY: Past Medical History  Diagnosis Date  . Mental disorder     depression-  . Arthritis   . Hot flashes   . Head ache   . Malignant neoplasm of breast (female), unspecified site   . Family history of malignant neoplasm of breast   . Depression     pt denies   . Anemia     low iron    PAST SURGICAL HISTORY: Past Surgical History  Procedure Laterality  Date  . Knee surgery  1995    right  . Foot surgery  2011    rt  . Tubal ligation    . Uterine fibroid surgery  2005    ablation  . Breast lumpectomy with sentinel lymph node biopsy Left 06/11/2014    Procedure: BREAST LUMPECTOMY WITH SENTINEL LYMPH NODE BX, POSSIBLE AXILLARY LYMPH NODE DISSECTION;  Surgeon: Stark Klein, MD;   Location: [place];  Service: General;  Laterality: Left;  . Portacath placement N/A 07/02/2014    Procedure: INSERTION PORT-A-CATH;  Surgeon: Stark Klein, MD;  Location: [place];  Service: General;  Laterality: N/A;  . Incision and drainage Left 07/02/2014    Procedure: Drainage left breast seroma;  Surgeon: Stark Klein, MD;  Location: [place];  Service: General;  Laterality: Left;  . Evacuation breast hematoma Left 07/10/2014    Procedure: EVACUATION OF LEFT BREAST HEMATOMA ;  Surgeon: Stark Klein, MD;  Location: [facility];  Service: General;  Laterality: Left;    FAMILY HISTORY Family History  Problem Relation Age of Onset  . Breast cancer Mother 33    currently 61; TAH/BSO d/t ?cervical ca at 58  . Breast cancer Maternal Aunt     dx 72s; currently in her late 64s  . Lung cancer Father     deceased 26   the patient's father died at the age of 63 from lung cancer. The patient's mother was diagnosed with breast cancer at the age of 82. She survives. The patient had 2 brothers, and 2 sisters. One brother may have a diagnosis of "stomach cancer"  GYNECOLOGIC HISTORY:  No LMP recorded. Patient has had an ablation. Menarche age 19 which is also when the patient first had a child. She is GX P3. She stopped having periods after laser ablation in 2005.  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 At home she lives with her daughter [name] and her 4 children who are 73, 8, 5, and 4. 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 The patient's son [name] is a fast food cook in [place] and the patient's daughter [name] works as a Scientist, water quality, also in [place]. The patient has 7 grandchildren. She is not a church attender    ADVANCED DIRECTIVES: Not in place; this was discussed with the patient on her first visit, 05/21/2014, and she indicated she plans to name her daughter [name] as her healthcare power  of attorney   HEALTH MAINTENANCE: History  Substance Use Topics  . Smoking status: Current Every Day Smoker -- 0.25 packs/day    Types: Cigarettes  . Smokeless tobacco: Never Used  . Alcohol Use: No     Comment: Percoset,Vicodin, Oxycotin     Colonoscopy:  PAP:  Bone density:  Lipid panel:  Allergies  Allergen Reactions  . Norco [Hydrocodone-Acetaminophen] Itching and Nausea Only    Pt reported allergy.    Current Outpatient Prescriptions  Medication Sig Dispense Refill  . dexamethasone (DECADRON) 4 MG tablet Take 2 tablets by mouth once a day on the day after chemotherapy and then take 2 tablets two times a day for 2 days. Take with food.  30 tablet  1  . ibuprofen (ADVIL,MOTRIN) 200 MG tablet Take 400 mg by mouth every 6 (six) hours as needed for moderate pain.      Marland Kitchen lidocaine-prilocaine (EMLA) cream Apply 1 application topically as needed.  30 g  0  .  LORazepam (ATIVAN) 0.5 MG tablet Take 1 tablet (0.5 mg total) by mouth every 6 (six) hours as needed (Nausea or vomiting).  30 tablet  0  . omeprazole (PRILOSEC) 40 MG capsule Take 1 capsule (40 mg total) by mouth daily.  30 capsule  2  . ondansetron (ZOFRAN ODT) 8 MG disintegrating tablet Take 1 tablet (8 mg total) by mouth every 8 (eight) hours as needed for nausea or vomiting.  20 tablet  0  . oxyCODONE (OXY IR/ROXICODONE) 5 MG immediate release tablet Take 3-4 tablets (15-20 mg total) by mouth every 4 (four) hours as needed for moderate pain, severe pain or breakthrough pain.  30 tablet  0  . prochlorperazine (COMPAZINE) 10 MG tablet Take 1 tablet (10 mg total) by mouth every 6 (six) hours as needed (Nausea or vomiting).  30 tablet  1  . ranitidine (ZANTAC) 150 MG tablet Take 1 tablet (150 mg total) by mouth 2 (two) times daily.  60 tablet  0   No current facility-administered medications for this visit.   Facility-Administered Medications Ordered in Other Visits  Medication Dose Route Frequency Provider Last Rate Last  Dose  . sodium chloride 0.9 % injection 10 mL  10 mL Intracatheter PRN Chauncey Cruel, MD   10 mL at 07/29/14 1704    OBJECTIVE: Middle-aged [place] American woman who appears stated age 53 Vitals:   08/12/14 1339  BP: 125/78  Pulse: 79  Temp: 99.1 F (37.3 C)  Resp: 18     Body mass index is 25.53 kg/(m^2).    ECOG FS:1 - Symptomatic but completely ambulatory  Skin: warm, dry, nail beds becoming hyperpigmented at bases HEENT: sclerae anicteric, conjunctivae pink, oropharynx clear. No thrush or mucositis.  Lymph Nodes: No cervical or supraclavicular lymphadenopathy  Lungs: clear to auscultation bilaterally, no rales, wheezes, or rhonci  Heart: regular rate and rhythm  Abdomen: round, soft, non tender, positive bowel sounds  Musculoskeletal: No focal spinal tenderness, mild left upper extremity lymphedema, ROM limited while reaching behind her back Neuro: non focal, well oriented, positive affect  Breasts: deferred  LAB RESULTS:  CMP     Component Value Date/Time   NA 138 08/05/2014 1342   NA 138 06/03/2014 1326   K 4.1 08/05/2014 1342   K 3.5* 06/03/2014 1326   CL 95* 06/03/2014 1326   CO2 27 08/05/2014 1342   CO2 30 06/03/2014 1326   GLUCOSE 196* 08/05/2014 1342   GLUCOSE 114* 06/03/2014 1326   BUN 11.0 08/05/2014 1342   BUN 14 06/03/2014 1326   CREATININE 0.7 08/05/2014 1342   CREATININE 0.63 06/03/2014 1326   CALCIUM 10.0 08/05/2014 1342   CALCIUM 10.2 06/03/2014 1326   PROT 7.8 08/05/2014 1342   PROT 8.6* 06/03/2014 1326   ALBUMIN 3.8 08/05/2014 1342   ALBUMIN 4.7 06/03/2014 1326   AST 10 08/05/2014 1342   AST 21 06/03/2014 1326   ALT 15 08/05/2014 1342   ALT 21 06/03/2014 1326   ALKPHOS 96 08/05/2014 1342   ALKPHOS 75 06/03/2014 1326   BILITOT 0.24 08/05/2014 1342   BILITOT 0.5 06/03/2014 1326   GFRNONAA >90 06/03/2014 1326   GFRAA >90 06/03/2014 1326    I No results found for this basename: SPEP,  UPEP,   kappa and lambda light chains    Lab Results  Component Value Date   WBC 10.9*  08/12/2014   NEUTROABS 7.3* 08/12/2014   HGB 11.1* 08/12/2014   HCT 34.1* 08/12/2014   MCV 88.1 08/12/2014  PLT 257 08/12/2014      Chemistry      Component Value Date/Time   NA 138 08/05/2014 1342   NA 138 06/03/2014 1326   K 4.1 08/05/2014 1342   K 3.5* 06/03/2014 1326   CL 95* 06/03/2014 1326   CO2 27 08/05/2014 1342   CO2 30 06/03/2014 1326   BUN 11.0 08/05/2014 1342   BUN 14 06/03/2014 1326   CREATININE 0.7 08/05/2014 1342   CREATININE 0.63 06/03/2014 1326      Component Value Date/Time   CALCIUM 10.0 08/05/2014 1342   CALCIUM 10.2 06/03/2014 1326   ALKPHOS 96 08/05/2014 1342   ALKPHOS 75 06/03/2014 1326   AST 10 08/05/2014 1342   AST 21 06/03/2014 1326   ALT 15 08/05/2014 1342   ALT 21 06/03/2014 1326   BILITOT 0.24 08/05/2014 1342   BILITOT 0.5 06/03/2014 1326       No results found for this basename: LABCA2    No components found with this basename: LABCA125    No results found for this basename: INR,  in the last 168 hours  Urinalysis    Component Value Date/Time   COLORURINE YELLOW 06/03/2014 [place] 06/03/2014 1050   LABSPEC >1.030* 06/03/2014 1050   PHURINE 6.0 06/03/2014 1050   GLUCOSEU NEGATIVE 06/03/2014 1050   HGBUR SMALL* 06/03/2014 1050   BILIRUBINUR SMALL* 06/03/2014 1050   KETONESUR NEGATIVE 06/03/2014 [place] 06/03/2014 1050   UROBILINOGEN 0.2 06/03/2014 1050   NITRITE NEGATIVE 06/03/2014 1050   LEUKOCYTESUR TRACE* 06/03/2014 1050    STUDIES: Chest Port 1 View  07/02/2014   CLINICAL DATA:  Postoperative from Port-A-Cath appliance insertion  EXAM: PORTABLE CHEST - 1 VIEW  COMPARISON:  PA and lateral chest of May 29, 2011  FINDINGS: The Port-A-Cath appliance tip lies in the midportion of the SVC. There is no postprocedure pneumothorax or hemo thorax. The lungs are adequately inflated and clear. The heart and mediastinal structures are within the limits of normal. There are numerous surgical clips in the left axillary region.  IMPRESSION: There is no  postprocedure complication following Port-A-Cath appliance placement.   Electronically Signed   By: David  Martinique   On: 07/02/2014 11:24   Dg Fluoro Guide Cv Line-no Report  07/02/2014   CLINICAL DATA: portacath insertion   FLOURO GUIDE CV LINE  Fluoroscopy was utilized by the requesting physician.  No radiographic  interpretation.     ASSESSMENT: 48 y.o. [place] woman status post left breast upper outer quadrant biopsy 05/09/2014 for a clinicalT1c N1, stage IIA invasive ductal carcinoma, grade 2, estrogen and progesterone receptor positive, HER-2 nonamplified, with an MIB-1 of 80%  (1) patient met with genetics counselor 05/29/2014 but decided against genetics testing   (2) tobacco abuse. The patient has been strongly urged to discontinue smoking.  (3) status post left lumpectomy and axillary lymph node dissection 06/11/2014 for a pT1c pN3, stage IIIC invasive ductal carcinoma, grade 3, with repeat HER-2 negative  (4) adjuvant chemotherapy to consist of doxorubicin and cyclophosphamide in dose dense fashion x4, with Neulasta support, to start 07/29/2014, to be followed by paclitaxel weekly x12  (5) adjuvant radiation to follow chemotherapy  (6) anti-estrogens for 5-10 years to follow radiation  PLAN: Lundyn and I had a 30 minute visit. Suddenly after discussing symptoms and her trip to the ED, she exclaimed that "nobody cared" about her breast. When I tried to explain that between the physical therapist, my nurse Orson Eva, and  myself, she had 3 people who cared very deeply about resolving this issue. The physical therapist has stopped by my office several times to explain that the compression pump would be most helpful, and was approved under medicaid. I of course know she missed this appointment because of her time in the ED, but Lynnae has failed to reschedule or return the physical therapist's calls. Malashia became tearful and stated that after this chemotherapy visit, she would not be returning.  I explained to Ms. Wilcock that we hoped to cure her cancer, and that stopping chemotherapy would prove beneficial in the long run. She is tolerating it well and has little reason to quit at this point, despite her frustration. She walked out of our session before it ended, telling her daughter that she "would not be coming back" and that she is tired of being "ignored and mocked." I am unsure of what was said during our visit to spark this reaction. I will be transferring her to Dr. Virgie Dad service next week, should she decide to return, to discuss further treatment.   ADDENDUM: Patient proceeded with day 1, cycle 2 of doxorubicin and cylophosphamide yesterday after our visit. But as of 3:15pm on 08/13/14, she has not returned for her neulasta injection. We are having the nurses reach out to her via telephone at this time.    Marcelino Duster, NP 08/12/2014

## 2014-08-12 NOTE — Patient Instructions (Signed)
[facility] Discharge Instructions for Patients Receiving Chemotherapy  Today you received the following chemotherapy agents : Adriamycin and Cytoxan  To help prevent nausea and vomiting after your treatment, we encourage you to take your nausea medications as directed:  Compazine 10 mg every 6 hours as needed  Ativan 0.5 mg every 6 hours as needed  Decadron 8 mg tomorrow, then 8 mg twice daily X 2 days  Zofran 8 mg every 8 hours as needed (start on 08/15/14) since you had IV Aloxi here today If you develop nausea and vomiting that is not controlled by your nausea medication, call the clinic.   BELOW ARE SYMPTOMS THAT SHOULD BE REPORTED IMMEDIATELY:  *FEVER GREATER THAN 100.5 F  *CHILLS WITH OR WITHOUT FEVER  NAUSEA AND VOMITING THAT IS NOT CONTROLLED WITH YOUR NAUSEA MEDICATION  *UNUSUAL SHORTNESS OF BREATH  *UNUSUAL BRUISING OR BLEEDING  TENDERNESS IN MOUTH AND THROAT WITH OR WITHOUT PRESENCE OF ULCERS  *URINARY PROBLEMS  *BOWEL PROBLEMS  UNUSUAL RASH Items with * indicate a potential emergency and should be followed up as soon as possible.  Feel free to call the clinic should you have any questions or concerns. The clinic phone number is [phone].  It has been a pleasure to serve you today!

## 2014-08-12 NOTE — Progress Notes (Signed)
Patient was tearful briefly in infusion room, but declined to discuss anything with nurse, saying "I'm fine".

## 2014-08-13 ENCOUNTER — Telehealth: Payer: Self-pay | Admitting: Oncology

## 2014-08-13 ENCOUNTER — Ambulatory Visit: Payer: Self-pay

## 2014-08-13 ENCOUNTER — Encounter: Payer: Self-pay | Admitting: Nurse Practitioner

## 2014-08-13 ENCOUNTER — Ambulatory Visit (HOSPITAL_BASED_OUTPATIENT_CLINIC_OR_DEPARTMENT_OTHER): Payer: Medicaid Other

## 2014-08-13 VITALS — BP 136/81 | HR 83 | Temp 97.9°F

## 2014-08-13 DIAGNOSIS — C50412 Malignant neoplasm of upper-outer quadrant of left female breast: Secondary | ICD-10-CM

## 2014-08-13 DIAGNOSIS — C50812 Malignant neoplasm of overlapping sites of left female breast: Secondary | ICD-10-CM

## 2014-08-13 DIAGNOSIS — Z5189 Encounter for other specified aftercare: Secondary | ICD-10-CM

## 2014-08-13 DIAGNOSIS — C773 Secondary and unspecified malignant neoplasm of axilla and upper limb lymph nodes: Secondary | ICD-10-CM

## 2014-08-13 DIAGNOSIS — C50919 Malignant neoplasm of unspecified site of unspecified female breast: Secondary | ICD-10-CM

## 2014-08-13 MED ORDER — PEGFILGRASTIM INJECTION 6 MG/0.6ML
6.0000 mg | Freq: Once | SUBCUTANEOUS | Status: AC
  Administered 2014-08-13: 6 mg via SUBCUTANEOUS
  Filled 2014-08-13: qty 0.6

## 2014-08-13 NOTE — Patient Instructions (Signed)
Pegfilgrastim injection What is this medicine? PEGFILGRASTIM (peg fil GRA stim) is a long-acting granulocyte colony-stimulating factor that stimulates the growth of neutrophils, a type of white blood cell important in the body's fight against infection. It is used to reduce the incidence of fever and infection in patients with certain types of cancer who are receiving chemotherapy that affects the bone marrow. This medicine may be used for other purposes; ask your health care provider or pharmacist if you have questions. COMMON BRAND NAME(S): Neulasta What should I tell my health care provider before I take this medicine? They need to know if you have any of these conditions: -latex allergy -ongoing radiation therapy -sickle cell disease -skin reactions to acrylic adhesives (On-Body Injector only) -an unusual or allergic reaction to pegfilgrastim, filgrastim, other medicines, foods, dyes, or preservatives -pregnant or trying to get pregnant -breast-feeding How should I use this medicine? This medicine is for injection under the skin. If you get this medicine at home, you will be taught how to prepare and give the pre-filled syringe or how to use the On-body Injector. Refer to the patient Instructions for Use for detailed instructions. Use exactly as directed. Take your medicine at regular intervals. Do not take your medicine more often than directed. It is important that you put your used needles and syringes in a special sharps container. Do not put them in a trash can. If you do not have a sharps container, call your pharmacist or healthcare provider to get one. Talk to your pediatrician regarding the use of this medicine in children. Special care may be needed. Overdosage: If you think you have taken too much of this medicine contact a poison control center or emergency room at once. NOTE: This medicine is only for you. Do not share this medicine with others. What if I miss a dose? It is  important not to miss your dose. Call your doctor or health care professional if you miss your dose. If you miss a dose due to an On-body Injector failure or leakage, a new dose should be administered as soon as possible using a single prefilled syringe for manual use. What may interact with this medicine? Interactions have not been studied. Give your health care provider a list of all the medicines, herbs, non-prescription drugs, or dietary supplements you use. Also tell them if you smoke, drink alcohol, or use illegal drugs. Some items may interact with your medicine. This list may not describe all possible interactions. Give your health care provider a list of all the medicines, herbs, non-prescription drugs, or dietary supplements you use. Also tell them if you smoke, drink alcohol, or use illegal drugs. Some items may interact with your medicine. What should I watch for while using this medicine? You may need blood work done while you are taking this medicine. If you are going to need a MRI, CT scan, or other procedure, tell your doctor that you are using this medicine (On-Body Injector only). What side effects may I notice from receiving this medicine? Side effects that you should report to your doctor or health care professional as soon as possible: -allergic reactions like skin rash, itching or hives, swelling of the face, lips, or tongue -dizziness -fever -pain, redness, or irritation at site where injected -pinpoint red spots on the skin -shortness of breath or breathing problems -stomach or side pain, or pain at the shoulder -swelling -tiredness -trouble passing urine Side effects that usually do not require medical attention (report to your doctor   or health care professional if they continue or are bothersome): -bone pain -muscle pain This list may not describe all possible side effects. Call your doctor for medical advice about side effects. You may report side effects to FDA at  1-800-FDA-1088. Where should I keep my medicine? Keep out of the reach of children. Store pre-filled syringes in a refrigerator between 2 and 8 degrees C (36 and 46 degrees F). Do not freeze. Keep in carton to protect from light. Throw away this medicine if it is left out of the refrigerator for more than 48 hours. Throw away any unused medicine after the expiration date. NOTE: This sheet is a summary. It may not cover all possible information. If you have questions about this medicine, talk to your doctor, pharmacist, or health care provider.  2015, Elsevier/Gold Standard. (2014-01-16 16:14:05)  

## 2014-08-13 NOTE — Telephone Encounter (Signed)
per GM to move pt to his sch from HF-pt aware

## 2014-08-14 ENCOUNTER — Telehealth: Payer: Self-pay | Admitting: Physical Therapy

## 2014-08-17 NOTE — Telephone Encounter (Signed)
Created encounter in error

## 2014-08-19 ENCOUNTER — Ambulatory Visit: Payer: Self-pay | Admitting: Oncology

## 2014-08-19 ENCOUNTER — Other Ambulatory Visit: Payer: Self-pay

## 2014-08-19 ENCOUNTER — Ambulatory Visit: Payer: Self-pay | Admitting: Nurse Practitioner

## 2014-08-21 ENCOUNTER — Ambulatory Visit: Payer: Medicaid Other | Admitting: Physical Therapy

## 2014-08-21 ENCOUNTER — Telehealth (INDEPENDENT_AMBULATORY_CARE_PROVIDER_SITE_OTHER): Payer: Self-pay

## 2014-08-21 NOTE — Telephone Encounter (Signed)
Pt calling to request pain medication.  She has finished PT and is having a lot of discomfort.  Dr. Barry Dienes to be made aware.

## 2014-08-23 ENCOUNTER — Other Ambulatory Visit: Payer: Self-pay | Admitting: Oncology

## 2014-08-25 ENCOUNTER — Ambulatory Visit: Payer: Medicaid Other | Admitting: Physical Therapy

## 2014-08-26 ENCOUNTER — Ambulatory Visit (HOSPITAL_BASED_OUTPATIENT_CLINIC_OR_DEPARTMENT_OTHER): Payer: Medicaid Other | Admitting: Oncology

## 2014-08-26 ENCOUNTER — Ambulatory Visit (HOSPITAL_BASED_OUTPATIENT_CLINIC_OR_DEPARTMENT_OTHER): Payer: Medicaid Other

## 2014-08-26 ENCOUNTER — Telehealth: Payer: Self-pay | Admitting: Oncology

## 2014-08-26 ENCOUNTER — Other Ambulatory Visit: Payer: Self-pay | Admitting: *Deleted

## 2014-08-26 ENCOUNTER — Other Ambulatory Visit (HOSPITAL_BASED_OUTPATIENT_CLINIC_OR_DEPARTMENT_OTHER): Payer: Medicaid Other

## 2014-08-26 ENCOUNTER — Encounter: Payer: Self-pay | Admitting: Nurse Practitioner

## 2014-08-26 VITALS — BP 129/74 | HR 108 | Temp 98.7°F | Resp 18 | Ht 70.0 in | Wt 181.2 lb

## 2014-08-26 DIAGNOSIS — C50812 Malignant neoplasm of overlapping sites of left female breast: Secondary | ICD-10-CM

## 2014-08-26 DIAGNOSIS — C50912 Malignant neoplasm of unspecified site of left female breast: Secondary | ICD-10-CM

## 2014-08-26 DIAGNOSIS — C773 Secondary and unspecified malignant neoplasm of axilla and upper limb lymph nodes: Secondary | ICD-10-CM

## 2014-08-26 DIAGNOSIS — C50412 Malignant neoplasm of upper-outer quadrant of left female breast: Secondary | ICD-10-CM

## 2014-08-26 DIAGNOSIS — Z5111 Encounter for antineoplastic chemotherapy: Secondary | ICD-10-CM

## 2014-08-26 DIAGNOSIS — C50919 Malignant neoplasm of unspecified site of unspecified female breast: Secondary | ICD-10-CM

## 2014-08-26 DIAGNOSIS — Z170 Estrogen receptor positive status [ER+]: Secondary | ICD-10-CM

## 2014-08-26 LAB — CBC WITH DIFFERENTIAL/PLATELET
BASO%: 0.3 % (ref 0.0–2.0)
Basophils Absolute: 0 10*3/uL (ref 0.0–0.1)
EOS%: 0.1 % (ref 0.0–7.0)
Eosinophils Absolute: 0 10*3/uL (ref 0.0–0.5)
HCT: 33.6 % — ABNORMAL LOW (ref 34.8–46.6)
HGB: 10.7 g/dL — ABNORMAL LOW (ref 11.6–15.9)
LYMPH%: 11.1 % — AB (ref 14.0–49.7)
MCH: 28.4 pg (ref 25.1–34.0)
MCHC: 31.8 g/dL (ref 31.5–36.0)
MCV: 89.4 fL (ref 79.5–101.0)
MONO#: 0.9 10*3/uL (ref 0.1–0.9)
MONO%: 5.8 % (ref 0.0–14.0)
NEUT#: 13.1 10*3/uL — ABNORMAL HIGH (ref 1.5–6.5)
NEUT%: 82.7 % — AB (ref 38.4–76.8)
PLATELETS: 247 10*3/uL (ref 145–400)
RBC: 3.75 10*6/uL (ref 3.70–5.45)
RDW: 15.6 % — ABNORMAL HIGH (ref 11.2–14.5)
WBC: 15.8 10*3/uL — ABNORMAL HIGH (ref 3.9–10.3)
lymph#: 1.7 10*3/uL (ref 0.9–3.3)

## 2014-08-26 LAB — COMPREHENSIVE METABOLIC PANEL (CC13)
ALT: 28 U/L (ref 0–55)
ANION GAP: 10 meq/L (ref 3–11)
AST: 14 U/L (ref 5–34)
Albumin: 3.4 g/dL — ABNORMAL LOW (ref 3.5–5.0)
Alkaline Phosphatase: 115 U/L (ref 40–150)
BUN: 11.1 mg/dL (ref 7.0–26.0)
CO2: 26 mEq/L (ref 22–29)
CREATININE: 0.7 mg/dL (ref 0.6–1.1)
Calcium: 9.1 mg/dL (ref 8.4–10.4)
Chloride: 100 mEq/L (ref 98–109)
Glucose: 266 mg/dl — ABNORMAL HIGH (ref 70–140)
Potassium: 4.1 mEq/L (ref 3.5–5.1)
Sodium: 136 mEq/L (ref 136–145)
Total Protein: 6.2 g/dL — ABNORMAL LOW (ref 6.4–8.3)

## 2014-08-26 MED ORDER — SODIUM CHLORIDE 0.9 % IJ SOLN
10.0000 mL | INTRAMUSCULAR | Status: DC | PRN
  Administered 2014-08-26: 10 mL
  Filled 2014-08-26: qty 10

## 2014-08-26 MED ORDER — DEXAMETHASONE SODIUM PHOSPHATE 20 MG/5ML IJ SOLN
INTRAMUSCULAR | Status: AC
Start: 2014-08-26 — End: 2014-08-26
  Filled 2014-08-26: qty 5

## 2014-08-26 MED ORDER — PALONOSETRON HCL INJECTION 0.25 MG/5ML
0.2500 mg | Freq: Once | INTRAVENOUS | Status: AC
  Administered 2014-08-26: 0.25 mg via INTRAVENOUS

## 2014-08-26 MED ORDER — DOXORUBICIN HCL CHEMO IV INJECTION 2 MG/ML
60.0000 mg/m2 | Freq: Once | INTRAVENOUS | Status: AC
  Administered 2014-08-26: 118 mg via INTRAVENOUS
  Filled 2014-08-26: qty 59

## 2014-08-26 MED ORDER — SODIUM CHLORIDE 0.9 % IV SOLN
Freq: Once | INTRAVENOUS | Status: AC
  Administered 2014-08-26: 15:00:00 via INTRAVENOUS

## 2014-08-26 MED ORDER — ONDANSETRON 8 MG PO TBDP: 8.0000 mg | ORAL_TABLET | Freq: Three times a day (TID) | ORAL | Status: DC | PRN

## 2014-08-26 MED ORDER — HEPARIN SOD (PORK) LOCK FLUSH 100 UNIT/ML IV SOLN
500.0000 [IU] | Freq: Once | INTRAVENOUS | Status: AC | PRN
Start: 2014-08-26 — End: 2014-08-26
  Administered 2014-08-26: 500 [IU]
  Filled 2014-08-26: qty 5

## 2014-08-26 MED ORDER — DEXAMETHASONE SODIUM PHOSPHATE 20 MG/5ML IJ SOLN
INTRAMUSCULAR | Status: AC
  Filled 2014-08-26: qty 5

## 2014-08-26 MED ORDER — SODIUM CHLORIDE 0.9 % IV SOLN
600.0000 mg/m2 | Freq: Once | INTRAVENOUS | Status: AC
  Administered 2014-08-26: 1180 mg via INTRAVENOUS
  Filled 2014-08-26: qty 59

## 2014-08-26 MED ORDER — DEXAMETHASONE SODIUM PHOSPHATE 20 MG/5ML IJ SOLN
12.0000 mg | Freq: Once | INTRAMUSCULAR | Status: AC
  Administered 2014-08-26: 12 mg via INTRAVENOUS

## 2014-08-26 MED ORDER — DEXAMETHASONE 4 MG PO TABS: ORAL_TABLET | ORAL | Status: DC

## 2014-08-26 MED ORDER — HYDROCODONE-IBUPROFEN 7.5-200 MG PO TABS: 1.0000 | ORAL_TABLET | Freq: Three times a day (TID) | ORAL | Status: DC | PRN

## 2014-08-26 MED ORDER — PALONOSETRON HCL INJECTION 0.25 MG/5ML
INTRAVENOUS | Status: AC
  Filled 2014-08-26: qty 5

## 2014-08-26 MED ORDER — SODIUM CHLORIDE 0.9 % IV SOLN
150.0000 mg | Freq: Once | INTRAVENOUS | Status: AC
  Administered 2014-08-26: 150 mg via INTRAVENOUS
  Filled 2014-08-26: qty 5

## 2014-08-26 NOTE — Patient Instructions (Addendum)
[facility] Discharge Instructions for Patients Receiving Chemotherapy  Today you received the following chemotherapy agents adriamycin and cytoxan.  To help prevent nausea and vomiting after your treatment, we encourage you to take your nausea medication zofran 8 mg every 12 hours and or compazine 10 mg every 6 hours as needed.   If you develop nausea and vomiting that is not controlled by your nausea medication, call the clinic.   BELOW ARE SYMPTOMS THAT SHOULD BE REPORTED IMMEDIATELY:  *FEVER GREATER THAN 100.5 F  *CHILLS WITH OR WITHOUT FEVER  NAUSEA AND VOMITING THAT IS NOT CONTROLLED WITH YOUR NAUSEA MEDICATION  *UNUSUAL SHORTNESS OF BREATH  *UNUSUAL BRUISING OR BLEEDING  TENDERNESS IN MOUTH AND THROAT WITH OR WITHOUT PRESENCE OF ULCERS  *URINARY PROBLEMS  *BOWEL PROBLEMS  UNUSUAL RASH Items with * indicate a potential emergency and should be followed up as soon as possible.  Feel free to call the clinic you have any questions or concerns. The clinic phone number is [phone].

## 2014-08-26 NOTE — Telephone Encounter (Signed)
, °

## 2014-08-26 NOTE — Progress Notes (Signed)
This encounter was created in error - please disregard.

## 2014-08-26 NOTE — Progress Notes (Signed)
[name]  [phone] [phone]     ID: Laura Matthews DOB: 01-Dec-1965  MR#: [id number]  SNK#:[id number]  Patient Care Team: No Pcp Per Patient as PCP - General (General Practice) Stark Klein, MD as Consulting Physician (General Surgery) Chauncey Cruel, MD as Consulting Physician (Oncology) Thea Silversmith, MD as Consulting Physician (Radiation Oncology) [facility] Bunnie Pion, NP as Nurse Practitioner (Nurse Practitioner)  CHIEF COMPLAINT: Stage III left breast cancer  CURRENT TREATMENT:adjuvant chemotherapy  BREAST CANCER HISTORY: From the original intake note:  The patient herself palpated a mass in her left breast and brought it to the attention of [facility] NP 04/22/2014. She palpated a large nontender mass at the 4:00 position as well as a small tender mass in the 6:00 position of the left breast. The patient was set up for imaging at the breast Center where on 05/05/2014 she underwent bilateral diagnostic mammography and left breast ultrasonography. The mammogram showed an irregular mass in the outer left breast with no other suspicious findings. This was firm and fixed at the 3:00 position approximately 10 cm from the nipple. Ultrasound showed a hypoechoic irregular solid mass in this area, measuring 1.8 cm. Ultrasound of the left axilla showed 2 suspicious level I lymph nodes, the largest measuring 1 cm, with a thickened pole. A smaller lymph node measured 0.6 cm but had no visible fatty hilum.  On 05/09/2014 the patient underwent biopsy of the left breast mass (she refused biopsy of the left axilla) which showed ([id number]) and invasive ductal carcinoma, grade 2, estrogen receptor positive at 100%, with strong staining intensity, progesterone receptor 80% positive, with weak staining intensity, with an MIB-1 of 80%, and no HER-2 amplification.  On 05/16/2014 the patient underwent bilateral breast MRI. This showed a 1.7 cm irregular enhancing  mass at the 3:00 position of the left breast associated with the biopsy cleft. There were 3 level I left axillary lymph nodes with thickened cortices. The largest measured 1.3 cm. There were no other findings of concern.  Her subsequent history is as detailed below.  INTERVAL HISTORY: Laura Matthews returns for follow up of her breast cancer. Today is day 1 cycle 3 of 4 planned every 2 week cycles of doxorubicin and cyclophosphamide given with Neulasta on day 2  REVIEW OF SYSTEMS:  Laura Matthews is managing to get through the treatments. She finally accepted physical therapy's help and now she is very grateful that she is getting some results. She feels guilty about having refused help initially. She tells me things are "opening up", and apparently her disability is coming through as is a voucher for her own place--she has been living in a shelter she tells me for last 9 months. She tells me her pain is not controlled with the current medications. She feels that some staff members do not care about her. I really do not believe that to be the case, but rather there are some difficult edges in her personality and these did in the way of her as she tries to interact with people in various situations. However that may be she insists that she only wants to see me and I will arrange to see her on a weekly basis until her chemotherapy is done. She denies unusual headaches, visual changes, nausea, vomiting, or change in bowel or bladder habits. Has been no intercurrent fever, rash, or bleeding. She feels the swelling in her breast "comes and goes", blood with the help of physical therapy that is a  bit better. A detailed review of systems today was otherwise stable.  PAST MEDICAL HISTORY: Past Medical History  Diagnosis Date  . Mental disorder     depression-  . Arthritis   . Hot flashes   . Head ache   . Malignant neoplasm of breast (female), unspecified site   . Family history of malignant neoplasm of breast   .  Depression     pt denies   . Anemia     low iron    PAST SURGICAL HISTORY: Past Surgical History  Procedure Laterality Date  . Knee surgery  1995    right  . Foot surgery  2011    rt  . Tubal ligation    . Uterine fibroid surgery  2005    ablation  . Breast lumpectomy with sentinel lymph node biopsy Left 06/11/2014    Procedure: BREAST LUMPECTOMY WITH SENTINEL LYMPH NODE BX, POSSIBLE AXILLARY LYMPH NODE DISSECTION;  Surgeon: Stark Klein, MD;  Location: [facility];  Service: General;  Laterality: Left;  . Portacath placement N/A 07/02/2014    Procedure: INSERTION PORT-A-CATH;  Surgeon: Stark Klein, MD;  Location: [facility];  Service: General;  Laterality: N/A;  . Incision and drainage Left 07/02/2014    Procedure: Drainage left breast seroma;  Surgeon: Stark Klein, MD;  Location: [facility];  Service: General;  Laterality: Left;  . Evacuation breast hematoma Left 07/10/2014    Procedure: EVACUATION OF LEFT BREAST HEMATOMA ;  Surgeon: Stark Klein, MD;  Location: [facility];  Service: General;  Laterality: Left;    FAMILY HISTORY Family History  Problem Relation Age of Onset  . Breast cancer Mother 56    currently 69; TAH/BSO d/t ?cervical ca at 47  . Breast cancer Maternal Aunt     dx 33s; currently in her late 20s  . Lung cancer Father     deceased 50   the patient's father died at the age of 58 from lung cancer. The patient's mother was diagnosed with breast cancer at the age of 61. She survives. The patient had 2 brothers, and 2 sisters. One brother may have a diagnosis of "stomach cancer"  GYNECOLOGIC HISTORY:  No LMP recorded. Patient has had an ablation. Menarche age 43 which is also when the patient first had a child. She is GX P3. She stopped having periods after laser ablation in 2005.  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 She has lived with her daughter Laura Matthews and her 4 children  who are 46, 8, 5, and 4. 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 More recently the patient is living in a shelter. The patient's son Laura Matthews is a fast food cook in [place] and the patient's daughter Laura Matthews works as a Scientist, water quality, also in [place]. The patient has 7 grandchildren. She is not a church attender    ADVANCED DIRECTIVES: Not in place; this was discussed with the patient on her first visit, 05/21/2014, and she indicated she plans to name her daughter Laura Matthews as her healthcare power of attorney   HEALTH MAINTENANCE: History  Substance Use Topics  . Smoking status: Current Every Day Smoker -- 0.25 packs/day    Types: Cigarettes  . Smokeless tobacco: Never Used  . Alcohol Use: No     Comment: Percoset,Vicodin, Oxycotin     Colonoscopy:  PAP:  Bone density:  Lipid panel:  Allergies  Allergen Reactions  . Norco [Hydrocodone-Acetaminophen]  Itching and Nausea Only    Pt reported allergy.    Current Outpatient Prescriptions  Medication Sig Dispense Refill  . dexamethasone (DECADRON) 4 MG tablet Take 2 tablets by mouth once a day on the day after chemotherapy and then take 2 tablets two times a day for 2 days. Take with food.  30 tablet  1  . lidocaine-prilocaine (EMLA) cream Apply 1 application topically as needed.  30 g  0  . LORazepam (ATIVAN) 0.5 MG tablet Take 1 tablet (0.5 mg total) by mouth every 6 (six) hours as needed (Nausea or vomiting).  30 tablet  0  . omeprazole (PRILOSEC) 40 MG capsule Take 1 capsule (40 mg total) by mouth daily.  30 capsule  2  . ondansetron (ZOFRAN ODT) 8 MG disintegrating tablet Take 1 tablet (8 mg total) by mouth every 8 (eight) hours as needed for nausea or vomiting.  20 tablet  0  . oxyCODONE (OXY IR/ROXICODONE) 5 MG immediate release tablet Take 3-4 tablets (15-20 mg total) by mouth every 4 (four) hours as needed for moderate pain, severe pain or breakthrough pain.  30 tablet  0  . prochlorperazine (COMPAZINE) 10 MG tablet Take 1  tablet (10 mg total) by mouth every 6 (six) hours as needed (Nausea or vomiting).  30 tablet  1  . ranitidine (ZANTAC) 150 MG tablet Take 1 tablet (150 mg total) by mouth 2 (two) times daily.  60 tablet  0  . ibuprofen (ADVIL,MOTRIN) 200 MG tablet Take 400 mg by mouth every 6 (six) hours as needed for moderate pain.       No current facility-administered medications for this visit.   Facility-Administered Medications Ordered in Other Visits  Medication Dose Route Frequency Provider Last Rate Last Dose  . sodium chloride 0.9 % injection 10 mL  10 mL Intracatheter PRN Chauncey Cruel, MD   10 mL at 07/29/14 1704    OBJECTIVE: Middle-aged [place] American woman who was tearful during today's visit Filed Vitals:   08/26/14 1358  BP: 129/74  Pulse: 108  Temp: 98.7 F (37.1 C)  Resp: 18     Body mass index is 26 kg/(m^2).    ECOG FS:1 - Symptomatic but completely ambulatory  Sclerae unicteric, pupils equal and reactive Oropharynx clear and slightly dry No cervical or supraclavicular adenopathy Lungs no rales or rhonchi Heart regular rate and rhythm Abd soft, nontender, positive bowel sounds MSK no focal spinal tenderness, no upper extremity lymphedema Neuro: nonfocal, well oriented, depressed and frustrated affect Breasts: The right breast is unremarkable The left breast is about 50% larger than the right and still shows some evidence of peau d'orange. It is however movable. I do not palpate any axillary adenopathy on the left. This is imaged below.     LAB RESULTS:  CMP     Component Value Date/Time   NA 138 08/05/2014 1342   NA 138 06/03/2014 1326   K 4.1 08/05/2014 1342   K 3.5* 06/03/2014 1326   CL 95* 06/03/2014 1326   CO2 27 08/05/2014 1342   CO2 30 06/03/2014 1326   GLUCOSE 196* 08/05/2014 1342   GLUCOSE 114* 06/03/2014 1326   BUN 11.0 08/05/2014 1342   BUN 14 06/03/2014 1326   CREATININE 0.7 08/05/2014 1342   CREATININE 0.63 06/03/2014 1326   CALCIUM 10.0 08/05/2014 1342   CALCIUM  10.2 06/03/2014 1326   PROT 7.8 08/05/2014 1342   PROT 8.6* 06/03/2014 1326   ALBUMIN 3.8 08/05/2014 1342  ALBUMIN 4.7 06/03/2014 1326   AST 10 08/05/2014 1342   AST 21 06/03/2014 1326   ALT 15 08/05/2014 1342   ALT 21 06/03/2014 1326   ALKPHOS 96 08/05/2014 1342   ALKPHOS 75 06/03/2014 1326   BILITOT 0.24 08/05/2014 1342   BILITOT 0.5 06/03/2014 1326   GFRNONAA >90 06/03/2014 1326   GFRAA >90 06/03/2014 1326    I No results found for this basename: SPEP,  UPEP,   kappa and lambda light chains    Lab Results  Component Value Date   WBC 15.8* 08/26/2014   NEUTROABS 13.1* 08/26/2014   HGB 10.7* 08/26/2014   HCT 33.6* 08/26/2014   MCV 89.4 08/26/2014   PLT 247 08/26/2014      Chemistry      Component Value Date/Time   NA 138 08/05/2014 1342   NA 138 06/03/2014 1326   K 4.1 08/05/2014 1342   K 3.5* 06/03/2014 1326   CL 95* 06/03/2014 1326   CO2 27 08/05/2014 1342   CO2 30 06/03/2014 1326   BUN 11.0 08/05/2014 1342   BUN 14 06/03/2014 1326   CREATININE 0.7 08/05/2014 1342   CREATININE 0.63 06/03/2014 1326      Component Value Date/Time   CALCIUM 10.0 08/05/2014 1342   CALCIUM 10.2 06/03/2014 1326   ALKPHOS 96 08/05/2014 1342   ALKPHOS 75 06/03/2014 1326   AST 10 08/05/2014 1342   AST 21 06/03/2014 1326   ALT 15 08/05/2014 1342   ALT 21 06/03/2014 1326   BILITOT 0.24 08/05/2014 1342   BILITOT 0.5 06/03/2014 1326       No results found for this basename: LABCA2    No components found with this basename: LABCA125    No results found for this basename: INR,  in the last 168 hours  Urinalysis    Component Value Date/Time   COLORURINE YELLOW 06/03/2014 [place] 06/03/2014 1050   LABSPEC >1.030* 06/03/2014 1050   PHURINE 6.0 06/03/2014 1050   GLUCOSEU NEGATIVE 06/03/2014 1050   HGBUR SMALL* 06/03/2014 1050   BILIRUBINUR SMALL* 06/03/2014 1050   KETONESUR NEGATIVE 06/03/2014 1050   PROTEINUR NEGATIVE 06/03/2014 1050   UROBILINOGEN 0.2 06/03/2014 1050   NITRITE NEGATIVE 06/03/2014 1050   LEUKOCYTESUR TRACE*  06/03/2014 1050    STUDIES: No results found.  ASSESSMENT: 48 y.o. [place] woman status post left breast upper outer quadrant biopsy 05/09/2014 for a clinicalT1c N1, stage IIA invasive ductal carcinoma, grade 2, estrogen and progesterone receptor positive, HER-2 nonamplified, with an MIB-1 of 80%  (1) patient met with genetics counselor 05/29/2014 but decided against genetics testing   (2) tobacco abuse. The patient has been strongly urged to discontinue smoking.  (3) status post left lumpectomy and axillary lymph node dissection 06/11/2014 for a pT1c pN3, stage IIIC invasive ductal carcinoma, grade 3, with repeat HER-2 negative  (4) adjuvant chemotherapy to consist of doxorubicin and cyclophosphamide in dose dense fashion x4, with Neulasta support, to start 07/29/2014, to be followed by paclitaxel weekly x12  (5) adjuvant radiation to follow chemotherapy  (6) anti-estrogens for 5-10 years to follow radiation  PLAN: Nicloe is having a hard time getting through the chemotherapy. It doesn't help that she has been living in a shelter for the last 9 months. However some things are getting better. Her disability is coming through. She just got a voucher so she can find her own place. And she is tolerating the chemotherapy itself relatively well. She will proceed to the third  cycle today.  We gave her a couple of both bowel sugars today and we will be glad to give her some Barbra Sarks commitment.  She has had difficulty with various of our support staff and as far as follow-up is concerned she would prefer to see me. I am going to be seeing her pretty much a weekly basis until she completes her chemotherapy. I will be scheduling her with a repeat breast MRI after her fourth cycle of doxorubicin and cyclophosphamide.  Pain is a big issue for her. She certainly does get pain from Neulasta and that should last no more than 2 or 3 days after each dose. I am giving her 30 hydrocodone today to take  3-4 times daily as needed. I have explained that I will not be able to refill this except every 2 weeks. We also discussed bowel prophylaxis. She has to make this medication last. The plan is to continue this until she is done with chemotherapy. She will then go to mastectomy on the left and after completing chemotherapy she will start her anti-estrogens.  Paulyne has a good understanding of the overall plan. She agrees with that. She knows the goal of treatment in her case is cure. She will call with any problems that may develop before her next visit here.   Chauncey Cruel, MD 08/26/2014

## 2014-08-27 ENCOUNTER — Encounter: Payer: Self-pay | Admitting: Oncology

## 2014-08-27 ENCOUNTER — Ambulatory Visit (HOSPITAL_BASED_OUTPATIENT_CLINIC_OR_DEPARTMENT_OTHER): Payer: Medicaid Other

## 2014-08-27 ENCOUNTER — Telehealth: Payer: Self-pay | Admitting: *Deleted

## 2014-08-27 VITALS — Temp 98.1°F

## 2014-08-27 DIAGNOSIS — C773 Secondary and unspecified malignant neoplasm of axilla and upper limb lymph nodes: Secondary | ICD-10-CM

## 2014-08-27 DIAGNOSIS — C50919 Malignant neoplasm of unspecified site of unspecified female breast: Secondary | ICD-10-CM

## 2014-08-27 DIAGNOSIS — C50412 Malignant neoplasm of upper-outer quadrant of left female breast: Secondary | ICD-10-CM

## 2014-08-27 DIAGNOSIS — C50812 Malignant neoplasm of overlapping sites of left female breast: Secondary | ICD-10-CM

## 2014-08-27 DIAGNOSIS — Z5189 Encounter for other specified aftercare: Secondary | ICD-10-CM

## 2014-08-27 MED ORDER — PEGFILGRASTIM INJECTION 6 MG/0.6ML
6.0000 mg | Freq: Once | SUBCUTANEOUS | Status: AC
  Administered 2014-08-27: 6 mg via SUBCUTANEOUS
  Filled 2014-08-27: qty 0.6

## 2014-08-27 NOTE — Telephone Encounter (Signed)
Per staff message and POF I have scheduled appts. Advised scheduler of appts. JMW  

## 2014-08-27 NOTE — Patient Instructions (Signed)
Pegfilgrastim injection What is this medicine? PEGFILGRASTIM (peg fil GRA stim) is a long-acting granulocyte colony-stimulating factor that stimulates the growth of neutrophils, a type of white blood cell important in the body's fight against infection. It is used to reduce the incidence of fever and infection in patients with certain types of cancer who are receiving chemotherapy that affects the bone marrow. This medicine may be used for other purposes; ask your health care provider or pharmacist if you have questions. COMMON BRAND NAME(S): Neulasta What should I tell my health care provider before I take this medicine? They need to know if you have any of these conditions: -latex allergy -ongoing radiation therapy -sickle cell disease -skin reactions to acrylic adhesives (On-Body Injector only) -an unusual or allergic reaction to pegfilgrastim, filgrastim, other medicines, foods, dyes, or preservatives -pregnant or trying to get pregnant -breast-feeding How should I use this medicine? This medicine is for injection under the skin. If you get this medicine at home, you will be taught how to prepare and give the pre-filled syringe or how to use the On-body Injector. Refer to the patient Instructions for Use for detailed instructions. Use exactly as directed. Take your medicine at regular intervals. Do not take your medicine more often than directed. It is important that you put your used needles and syringes in a special sharps container. Do not put them in a trash can. If you do not have a sharps container, call your pharmacist or healthcare provider to get one. Talk to your pediatrician regarding the use of this medicine in children. Special care may be needed. Overdosage: If you think you have taken too much of this medicine contact a poison control center or emergency room at once. NOTE: This medicine is only for you. Do not share this medicine with others. What if I miss a dose? It is  important not to miss your dose. Call your doctor or health care professional if you miss your dose. If you miss a dose due to an On-body Injector failure or leakage, a new dose should be administered as soon as possible using a single prefilled syringe for manual use. What may interact with this medicine? Interactions have not been studied. Give your health care provider a list of all the medicines, herbs, non-prescription drugs, or dietary supplements you use. Also tell them if you smoke, drink alcohol, or use illegal drugs. Some items may interact with your medicine. This list may not describe all possible interactions. Give your health care provider a list of all the medicines, herbs, non-prescription drugs, or dietary supplements you use. Also tell them if you smoke, drink alcohol, or use illegal drugs. Some items may interact with your medicine. What should I watch for while using this medicine? You may need blood work done while you are taking this medicine. If you are going to need a MRI, CT scan, or other procedure, tell your doctor that you are using this medicine (On-Body Injector only). What side effects may I notice from receiving this medicine? Side effects that you should report to your doctor or health care professional as soon as possible: -allergic reactions like skin rash, itching or hives, swelling of the face, lips, or tongue -dizziness -fever -pain, redness, or irritation at site where injected -pinpoint red spots on the skin -shortness of breath or breathing problems -stomach or side pain, or pain at the shoulder -swelling -tiredness -trouble passing urine Side effects that usually do not require medical attention (report to your doctor   or health care professional if they continue or are bothersome): -bone pain -muscle pain This list may not describe all possible side effects. Call your doctor for medical advice about side effects. You may report side effects to FDA at  1-800-FDA-1088. Where should I keep my medicine? Keep out of the reach of children. Store pre-filled syringes in a refrigerator between 2 and 8 degrees C (36 and 46 degrees F). Do not freeze. Keep in carton to protect from light. Throw away this medicine if it is left out of the refrigerator for more than 48 hours. Throw away any unused medicine after the expiration date. NOTE: This sheet is a summary. It may not cover all possible information. If you have questions about this medicine, talk to your doctor, pharmacist, or health care provider.  2015, Elsevier/Gold Standard. (2014-01-16 16:14:05)  

## 2014-08-27 NOTE — Progress Notes (Signed)
Approved pt for the $1000 [employer].  Pt has copy of grant letter.

## 2014-09-01 ENCOUNTER — Encounter: Payer: Self-pay | Admitting: Nurse Practitioner

## 2014-09-01 ENCOUNTER — Ambulatory Visit: Payer: Medicaid Other | Admitting: Physical Therapy

## 2014-09-01 ENCOUNTER — Other Ambulatory Visit: Payer: Self-pay | Admitting: Oncology

## 2014-09-02 ENCOUNTER — Other Ambulatory Visit: Payer: Self-pay

## 2014-09-02 ENCOUNTER — Ambulatory Visit (HOSPITAL_BASED_OUTPATIENT_CLINIC_OR_DEPARTMENT_OTHER): Payer: Medicaid Other | Admitting: Oncology

## 2014-09-02 ENCOUNTER — Other Ambulatory Visit (HOSPITAL_BASED_OUTPATIENT_CLINIC_OR_DEPARTMENT_OTHER): Payer: Medicaid Other

## 2014-09-02 ENCOUNTER — Ambulatory Visit: Payer: Self-pay | Admitting: Nurse Practitioner

## 2014-09-02 VITALS — BP 157/77 | HR 86 | Temp 98.4°F | Resp 18 | Ht 70.0 in | Wt 177.5 lb

## 2014-09-02 DIAGNOSIS — C50912 Malignant neoplasm of unspecified site of left female breast: Secondary | ICD-10-CM

## 2014-09-02 DIAGNOSIS — C773 Secondary and unspecified malignant neoplasm of axilla and upper limb lymph nodes: Secondary | ICD-10-CM

## 2014-09-02 DIAGNOSIS — C50412 Malignant neoplasm of upper-outer quadrant of left female breast: Secondary | ICD-10-CM

## 2014-09-02 DIAGNOSIS — Z170 Estrogen receptor positive status [ER+]: Secondary | ICD-10-CM

## 2014-09-02 DIAGNOSIS — Z720 Tobacco use: Secondary | ICD-10-CM

## 2014-09-02 LAB — CBC WITH DIFFERENTIAL/PLATELET
BASO%: 0.5 % (ref 0.0–2.0)
Basophils Absolute: 0 10*3/uL (ref 0.0–0.1)
EOS%: 0.5 % (ref 0.0–7.0)
Eosinophils Absolute: 0 10*3/uL (ref 0.0–0.5)
HCT: 32.8 % — ABNORMAL LOW (ref 34.8–46.6)
HGB: 10.7 g/dL — ABNORMAL LOW (ref 11.6–15.9)
LYMPH%: 37.4 % (ref 14.0–49.7)
MCH: 28.2 pg (ref 25.1–34.0)
MCHC: 32.6 g/dL (ref 31.5–36.0)
MCV: 86.5 fL (ref 79.5–101.0)
MONO#: 0.6 10*3/uL (ref 0.1–0.9)
MONO%: 31.1 % — AB (ref 0.0–14.0)
NEUT#: 0.6 10*3/uL — ABNORMAL LOW (ref 1.5–6.5)
NEUT%: 30.5 % — ABNORMAL LOW (ref 38.4–76.8)
Platelets: 252 10*3/uL (ref 145–400)
RBC: 3.79 10*6/uL (ref 3.70–5.45)
RDW: 15.6 % — AB (ref 11.2–14.5)
WBC: 2.1 10*3/uL — ABNORMAL LOW (ref 3.9–10.3)
lymph#: 0.8 10*3/uL — ABNORMAL LOW (ref 0.9–3.3)

## 2014-09-02 MED ORDER — OMEPRAZOLE 40 MG PO CPDR: 40.0000 mg | DELAYED_RELEASE_CAPSULE | Freq: Every day | ORAL | Status: DC

## 2014-09-02 MED ORDER — NICOTINE 21 MG/24HR TD PT24: 21.0000 mg | MEDICATED_PATCH | Freq: Every day | TRANSDERMAL | Status: DC

## 2014-09-02 MED ORDER — FLUCONAZOLE 100 MG PO TABS: 100.0000 mg | ORAL_TABLET | Freq: Every day | ORAL | Status: DC

## 2014-09-02 NOTE — Progress Notes (Signed)
[place]  Telephone:([phone]) [phone] Fax:(336) [phone]     ID: Laura Matthews DOB: [birth year]  MR#: [id number]  XTG#:[id number]  Patient Care Team: [name], MD as PCP - General (Family Medicine) Stark Klein, MD as Consulting Physician (General Surgery) Chauncey Cruel, MD as Consulting Physician (Oncology) Thea Silversmith, MD as Consulting Physician (Radiation Oncology) [facility] Bunnie Pion, NP as Nurse Practitioner (Nurse Practitioner)  CHIEF COMPLAINT: Stage III left breast cancer  CURRENT TREATMENT:adjuvant chemotherapy  BREAST CANCER HISTORY: From the original intake note:  The patient herself palpated a mass in her left breast and brought it to the attention of [facility] NP 04/22/2014. She palpated a large nontender mass at the 4:00 position as well as a small tender mass in the 6:00 position of the left breast. The patient was set up for imaging at the breast Center where on 05/05/2014 she underwent bilateral diagnostic mammography and left breast ultrasonography. The mammogram showed an irregular mass in the outer left breast with no other suspicious findings. This was firm and fixed at the 3:00 position approximately 10 cm from the nipple. Ultrasound showed a hypoechoic irregular solid mass in this area, measuring 1.8 cm. Ultrasound of the left axilla showed 2 suspicious level I lymph nodes, the largest measuring 1 cm, with a thickened pole. A smaller lymph node measured 0.6 cm but had no visible fatty hilum.  On 05/09/2014 the patient underwent biopsy of the left breast mass (she refused biopsy of the left axilla) which showed ([id number]) and invasive ductal carcinoma, grade 2, estrogen receptor positive at 100%, with strong staining intensity, progesterone receptor 80% positive, with weak staining intensity, with an MIB-1 of 80%, and no HER-2 amplification.  On 05/16/2014 the patient underwent bilateral breast MRI. This showed a 1.7 cm irregular enhancing  mass at the 3:00 position of the left breast associated with the biopsy cleft. There were 3 level I left axillary lymph nodes with thickened cortices. The largest measured 1.3 cm. There were no other findings of concern.  Her subsequent history is as detailed below.  INTERVAL HISTORY: Jahzara returns for follow up of her breast cancer accompanied by her friend, Laurelyn Sickle (who is an Chief Strategy Officer and a former Marine scientist). Today is day 8 cycle 3 of 4 planned every 2 week cycles of doxorubicin and cyclophosphamide given with Neulasta on day 2, to be followed by weekly paclitaxel 12.  Since her last visit here I was called by Dr. Noah Delaine, the patient's primary care physician, as well as Kynadie's physical therapist, wondering why I was considering a left mastectomy since the patient already had a lumpectomy. I could not really understand where this was coming from until I came across a note from my nurse which discussed that possibility with her. At the last visit I was struck by the swelling in Laura Matthews's left breast, took a photograph of it, and suggested we should obtain an MRI after her fourth cycle of chemotherapy to make sure there was nothing they are besides lymphedema. We normally do that in preparation for mastectomy, so my nurse's assumption was understandable. Unfortunately it made Laura Matthews very nervous. I was able to reassure her today that we are not planning to "amputate" her breast!  REVIEW OF SYSTEMS:  Laura Matthews is tolerating her chemotherapy remarkably well. She has had no nausea or vomiting, no intercurrent fever, and no unusual weakness. There have been no unusual headaches, visual changes, dizziness, or gait imbalance. She does have pain in the left breast  and sometimes in the left shoulder as well. A detailed review of systems today was otherwise entirely stable.  PAST MEDICAL HISTORY: Past Medical History  Diagnosis Date  . Mental disorder     depression-  . Arthritis   . Hot flashes   . Head  ache   . Malignant neoplasm of breast (female), unspecified site   . Family history of malignant neoplasm of breast   . Depression     pt denies   . Anemia     low iron    PAST SURGICAL HISTORY: Past Surgical History  Procedure Laterality Date  . Knee surgery  1995    right  . Foot surgery  2011    rt  . Tubal ligation    . Uterine fibroid surgery  2005    ablation  . Breast lumpectomy with sentinel lymph node biopsy Left 06/11/2014    Procedure: BREAST LUMPECTOMY WITH SENTINEL LYMPH NODE BX, POSSIBLE AXILLARY LYMPH NODE DISSECTION;  Surgeon: Stark Klein, MD;  Location: [place];  Service: General;  Laterality: Left;  . Portacath placement N/A 07/02/2014    Procedure: INSERTION PORT-A-CATH;  Surgeon: Stark Klein, MD;  Location: [place];  Service: General;  Laterality: N/A;  . Incision and drainage Left 07/02/2014    Procedure: Drainage left breast seroma;  Surgeon: Stark Klein, MD;  Location: [place];  Service: General;  Laterality: Left;  . Evacuation breast hematoma Left 07/10/2014    Procedure: EVACUATION OF LEFT BREAST HEMATOMA ;  Surgeon: Stark Klein, MD;  Location[facility];  Service: General;  Laterality: Left;    FAMILY HISTORY Family History  Problem Relation Age of Onset  . Breast cancer Mother 89    currently 38; TAH/BSO d/t ?cervical ca at 54  . Breast cancer Maternal Aunt     dx 26s; currently in her late 65s  . Lung cancer Father     deceased 64   the patient's father died at the age of 76 from lung cancer. The patient's mother was diagnosed with breast cancer at the age of 53. She survives. The patient had 2 brothers, and 2 sisters. One brother may have a diagnosis of "stomach cancer"  GYNECOLOGIC HISTORY:  No LMP recorded. Patient has had an ablation. Menarche age 61 which is also when the patient first had a child. She is GX P3. She stopped having periods after laser ablation in 2005.  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 She has lived with her daughter Laura Matthews and her 4 children who are 36, 8, 5, and 4. 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 More recently the patient is living in a shelter. The patient's son Laura Matthews is a fast food cook in [place] and the patient's daughter Laura Matthews works as a Scientist, water quality, also in [place]. The patient has 7 grandchildren. She is not a church attender    ADVANCED DIRECTIVES: Not in place; this was discussed with the patient on her first visit, 05/21/2014, and she indicated she plans to name her daughter Laura Matthews as her healthcare power of attorney   HEALTH MAINTENANCE: History  Substance Use Topics  . Smoking status: Current Every Day Smoker -- 0.25 packs/day    Types: Cigarettes  . Smokeless tobacco: Never Used  . Alcohol Use: No     Comment: Percoset,Vicodin, Oxycotin     Colonoscopy:  PAP:  Bone density:  Lipid panel:  Allergies  Allergen Reactions  . Norco [Hydrocodone-Acetaminophen] Itching and Nausea Only    Pt reported allergy.    Current Outpatient Prescriptions  Medication Sig Dispense Refill  . dexamethasone (DECADRON) 4 MG tablet Take 2 tablets by mouth once a day on the day after chemotherapy and then take 2 tablets two times a day for 2 days. Take with food. 30 tablet 1  . fluconazole (DIFLUCAN) 100 MG tablet Take 1 tablet (100 mg total) by mouth daily. 15 tablet 2  . HYDROcodone-ibuprofen (VICOPROFEN) 7.5-200 MG per tablet Take 1 tablet by mouth every 8 (eight) hours as needed for moderate pain. 30 tablet 0  . ibuprofen (ADVIL,MOTRIN) 200 MG tablet Take 400 mg by mouth every 6 (six) hours as needed for moderate pain.    Marland Kitchen lidocaine-prilocaine (EMLA) cream Apply 1 application topically as needed. 30 g 0  . LORazepam (ATIVAN) 0.5 MG tablet Take 1 tablet (0.5 mg total) by mouth every 6 (six) hours as needed (Nausea or vomiting). 30 tablet 0  . nicotine (NICODERM CQ) 21 mg/24hr patch  Place 1 patch (21 mg total) onto the skin daily. 28 patch 1  . omeprazole (PRILOSEC) 40 MG capsule Take 1 capsule (40 mg total) by mouth daily. 30 capsule 2  . omeprazole (PRILOSEC) 40 MG capsule Take 1 capsule (40 mg total) by mouth at bedtime. 30 capsule 6  . ondansetron (ZOFRAN ODT) 8 MG disintegrating tablet Take 1 tablet (8 mg total) by mouth every 8 (eight) hours as needed for nausea or vomiting. 20 tablet 0  . prochlorperazine (COMPAZINE) 10 MG tablet Take 1 tablet (10 mg total) by mouth every 6 (six) hours as needed (Nausea or vomiting). 30 tablet 1  . ranitidine (ZANTAC) 150 MG tablet Take 1 tablet (150 mg total) by mouth 2 (two) times daily. 60 tablet 0   No current facility-administered medications for this visit.   Facility-Administered Medications Ordered in Other Visits  Medication Dose Route Frequency Provider Last Rate Last Dose  . sodium chloride 0.9 % injection 10 mL  10 mL Intracatheter PRN Chauncey Cruel, MD   10 mL at 07/29/14 1704    OBJECTIVE: Middle-aged [place] American Matthews in no acute distress Filed Vitals:   09/02/14 1254  BP: 157/77  Pulse: 86  Temp: 98.4 F (36.9 C)  Resp: 18     Body mass index is 25.47 kg/(m^2).    ECOG FS:1 - Symptomatic but completely ambulatory  Sclerae unicteric, EOMs intact Oropharynx clear, mouth shows thrush No cervical or supraclavicular adenopathy Lungs no rales or rhonchi Heart regular rate and rhythm Abd soft, nontender, positive bowel sounds MSK no focal spinal tenderness, no upper extremity lymphedema Neuro: nonfocal, well oriented, anxious affect Breasts: The right breast is unremarkable The left breast is  larger than the right and still shows some evidence of peau d'orange. It is however movable. I do not palpate any axillary adenopathy on the left. The image below is from a week ago: There has been no significant change     LAB RESULTS:  CMP     Component Value Date/Time   NA 136 08/26/2014 1324   NA 138  06/03/2014 1326   K 4.1 08/26/2014 1324   K 3.5* 06/03/2014 1326   CL 95* 06/03/2014 1326   CO2 26 08/26/2014 1324   CO2 30 06/03/2014 1326   GLUCOSE 266* 08/26/2014 1324   GLUCOSE 114* 06/03/2014 1326   BUN 11.1 08/26/2014 1324   BUN 14 06/03/2014 1326   CREATININE  0.7 08/26/2014 1324   CREATININE 0.63 06/03/2014 1326   CALCIUM 9.1 08/26/2014 1324   CALCIUM 10.2 06/03/2014 1326   PROT 6.2* 08/26/2014 1324   PROT 8.6* 06/03/2014 1326   ALBUMIN 3.4* 08/26/2014 1324   ALBUMIN 4.7 06/03/2014 1326   AST 14 08/26/2014 1324   AST 21 06/03/2014 1326   ALT 28 08/26/2014 1324   ALT 21 06/03/2014 1326   ALKPHOS 115 08/26/2014 1324   ALKPHOS 75 06/03/2014 1326   BILITOT <0.20 08/26/2014 1324   BILITOT 0.5 06/03/2014 1326   GFRNONAA >90 06/03/2014 1326   GFRAA >90 06/03/2014 1326    I No results found for: SPEP  Lab Results  Component Value Date   WBC 2.1* 09/02/2014   NEUTROABS 0.6* 09/02/2014   HGB 10.7* 09/02/2014   HCT 32.8* 09/02/2014   MCV 86.5 09/02/2014   PLT 252 09/02/2014      Chemistry      Component Value Date/Time   NA 136 08/26/2014 1324   NA 138 06/03/2014 1326   K 4.1 08/26/2014 1324   K 3.5* 06/03/2014 1326   CL 95* 06/03/2014 1326   CO2 26 08/26/2014 1324   CO2 30 06/03/2014 1326   BUN 11.1 08/26/2014 1324   BUN 14 06/03/2014 1326   CREATININE 0.7 08/26/2014 1324   CREATININE 0.63 06/03/2014 1326      Component Value Date/Time   CALCIUM 9.1 08/26/2014 1324   CALCIUM 10.2 06/03/2014 1326   ALKPHOS 115 08/26/2014 1324   ALKPHOS 75 06/03/2014 1326   AST 14 08/26/2014 1324   AST 21 06/03/2014 1326   ALT 28 08/26/2014 1324   ALT 21 06/03/2014 1326   BILITOT <0.20 08/26/2014 1324   BILITOT 0.5 06/03/2014 1326       No results found for: LABCA2  No components found for: LABCA125  No results for input(s): INR in the last 168 hours.  Urinalysis    Component Value Date/Time   COLORURINE YELLOW 06/03/2014 1050   APPEARANCEUR CLEAR  06/03/2014 1050   LABSPEC >1.030* 06/03/2014 1050   PHURINE 6.0 06/03/2014 1050   GLUCOSEU NEGATIVE 06/03/2014 1050   HGBUR SMALL* 06/03/2014 1050   BILIRUBINUR SMALL* 06/03/2014 1050   KETONESUR NEGATIVE 06/03/2014 1050   PROTEINUR NEGATIVE 06/03/2014 1050   UROBILINOGEN 0.2 06/03/2014 1050   NITRITE NEGATIVE 06/03/2014 1050   LEUKOCYTESUR TRACE* 06/03/2014 1050    STUDIES: No results found.   ASSESSMENT: 48 y.o. Laura Matthews status post left breast upper outer quadrant biopsy 05/09/2014 for a clinicalT1c N1, stage IIA invasive ductal carcinoma, grade 2, estrogen and progesterone receptor positive, HER-2 nonamplified, with an MIB-1 of 80%  (1) patient met with genetics counselor 05/29/2014 but decided against genetics testing   (2) tobacco abuse. The patient has been strongly urged to discontinue smoking. She was prescribed a nicotine patch 09/02/2014  (3) status post left lumpectomy and axillary lymph node dissection 06/11/2014 for a pT1c pN3, stage IIIC invasive ductal carcinoma, grade 3, with repeat HER-2 negative  (4) adjuvant chemotherapy to consist of doxorubicin and cyclophosphamide in dose dense fashion x4, with Neulasta support, started 07/29/2014, to be followed by paclitaxel weekly x12  (5) adjuvant radiation to follow chemotherapy  (6) anti-estrogens for 5-10 years to follow radiation  PLAN: Laura Matthews was able to understand the confusion from last week and I apologized for frightening her by taking that picture of her left breast. Now that all that has been clarified I have urged her to go back to rehabilitation  to see she can get the lymphedema down.   I am concerned that she has thrush and although this may be partly due to Decadron which she takes for premeds, I am sending an HIV test. She tells me she has had several in the past, although I cannot find any in Epic.meanwhile I'm starting her on Diflucan 100 mg daily.  I again discussed smoking cessation and this  time Catori expressed an interest in trying a nicotine patch. This was provided.  She will return to see me next week she will receive her fourth and final cycle of cyclophosphamide and doxorubicin, and I will set her up for a repeat breast MRI before she returns to see me a week after that. At that point we will discuss weekly paclitaxel.  Laura Matthews has a good understanding of the overall plan. She agrees with that. She knows the goal of treatment in her case is cure. She will call with any problems that may develop before her next visit here.   Chauncey Cruel, MD 09/02/2014

## 2014-09-04 ENCOUNTER — Ambulatory Visit: Payer: Medicaid Other | Attending: General Surgery | Admitting: Physical Therapy

## 2014-09-04 ENCOUNTER — Encounter: Payer: Self-pay | Admitting: Physical Therapy

## 2014-09-04 DIAGNOSIS — I890 Lymphedema, not elsewhere classified: Secondary | ICD-10-CM | POA: Insufficient documentation

## 2014-09-04 DIAGNOSIS — M25512 Pain in left shoulder: Secondary | ICD-10-CM

## 2014-09-04 NOTE — Therapy (Signed)
Physical Therapy Treatment  Patient Details  Name: Laura Matthews MRN: [id number] Date of Birth: Oct 19, 1966  Encounter Date: 09/04/2014      PT End of Session - 09/04/14 1203    Visit Number 3   Number of Visits 4   Date for PT Re-Evaluation 09/08/14   PT Start Time 1100   PT Stop Time 1202   PT Time Calculation (min) 62 min   Activity Tolerance Patient tolerated treatment well      Past Medical History  Diagnosis Date  . Mental disorder     depression-  . Arthritis   . Hot flashes   . Head ache   . Malignant neoplasm of breast (female), unspecified site   . Family history of malignant neoplasm of breast   . Depression     pt denies   . Anemia     low iron    Past Surgical History  Procedure Laterality Date  . Knee surgery  1995    right  . Foot surgery  2011    rt  . Tubal ligation    . Uterine fibroid surgery  2005    ablation  . Breast lumpectomy with sentinel lymph node biopsy Left 06/11/2014    Procedure: BREAST LUMPECTOMY WITH SENTINEL LYMPH NODE BX, POSSIBLE AXILLARY LYMPH NODE DISSECTION;  Surgeon: Stark Klein, MD;  Location: [place];  Service: General;  Laterality: Left;  . Portacath placement N/A 07/02/2014    Procedure: INSERTION PORT-A-CATH;  Surgeon: Stark Klein, MD;  Location: [place];  Service: General;  Laterality: N/A;  . Incision and drainage Left 07/02/2014    Procedure: Drainage left breast seroma;  Surgeon: Stark Klein, MD;  Location: [place];  Service: General;  Laterality: Left;  . Evacuation breast hematoma Left 07/10/2014    Procedure: EVACUATION OF LEFT BREAST HEMATOMA ;  Surgeon: Stark Klein, MD;  Location: [place];  Service: General;  Laterality: Left;    There were no vitals taken for this visit.  Visit Diagnosis:  Lymphedema  Pain in joint, shoulder region, left      Subjective Assessment - 09/04/14 1103    Symptoms Patient here today for a lymphedema treatment for her left  breast.  She is pleased after her visit with her oncologist yesterday.  She continues to have left breast pain and swelling.   Pertinent History Left lumpectomy and sentinel node biopsy which resulted in a second surgery to reduce severe breast swelling.  Surgery findings showed lymphedema and seroma.   Patient Stated Goals Reduce swelling and increase shoulder range of motion   Currently in Pain? Yes   Pain Score 4    Pain Location Breast   Pain Orientation Left   Pain Descriptors / Indicators Throbbing   Pain Type Chronic pain   Pain Onset 1 to 4 weeks ago   Pain Frequency Constant   Aggravating Factors  increased swelling   Pain Relieving Factors decreased swelling          [facility] PT Assessment - 09/04/14 1109    Assessment   Medical Diagnosis Left breast cancer s/p lumpectomy and SLNB   Onset Date 06/17/14   Next MD Visit 09/09/14   Precautions   Precautions Other (comment)  cancer   Balance Screen   Has the patient fallen in the past 6 months No   Has the patient had a decrease in activity level because of a fear of falling?  No   Is  the patient reluctant to leave their home because of a fear of falling?  No   Home Environment   Family/patient expects to be discharged to: Private residence   Prior Function   Level of Independence Independent with basic ADLs   Cognition   Overall Cognitive Status Within Functional Limits for tasks assessed   Observation/Other Assessments   Observations --  Pitting edema and fibrotic tissue present left breast   AROM   Left Shoulder Flexion 143 Degrees   Left Shoulder ABduction 160 Degrees          [facility] Adult PT Treatment/Exercise - 09/04/14 1113    Exercises   Exercises Shoulder   Shoulder Exercises: Standing   Other Standing Exercises Finger ladder flexion and abduction x5 each  with verbal cues for proper technique   Shoulder Exercises: Pulleys   Flexion 2 minutes;3 minutes  with verbal cues for proper technique   ABduction  3 minutes  with verbal cues for proper technique   Shoulder Exercises: Therapy Ball   Flexion 10 reps  with verbal cues for proper technique     Manual lymph drainage to left breast: short neck, left inguinal nodes, right axillary nodes, anterior inter-axillary pathway, left axillo-inguinal pathway; left breast focused on lateral  and inferior breast redirecting along pathways.           Plan - 09/04/14 1203    Clinical Impression Statement Patient continues to have significant fibrosis in her left breast with severe swelling.  Left shoulder range of motion is significnatly improved since her eval.   Pt will benefit from skilled therapeutic intervention in order to improve on the following deficits Impaired UE functional use;Decreased knowledge of precautions;Increased edema;Decreased range of motion   Rehab Potential Good   Clinical Impairments Affecting Rehab Potential none   PT Frequency Min 1X/week   PT Duration Other (comment)  3 weeks limited by Medicaid contraints   PT Treatment/Interventions Therapeutic exercise;Manual techniques;Patient/family education   PT Plan Continue x1 visit as permitted by Medicaid.  Patient meeting with DME rep this afternoon to be fitted for a compression bra.  Next visit reassess and finalize HEP.  Continue to assist with getting Flexitouch.        Problem List Patient Active Problem List   Diagnosis Date Noted  . Neutropenia 08/05/2014  . Lymphedema of breast 08/05/2014  . Breast cancer metastasized to axillary lymph node 06/11/2014  . Family history of malignant neoplasm of breast   . Breast cancer of upper-outer quadrant of left female breast 05/19/2014  . Opiate dependence 05/01/2012    Class: Acute  . Homeless 05/01/2012    Class: Acute          Short Term Clinic Goals - 09/04/14 1216    CC Short Term Goal  #1   Title Patient will be able to report overall pain decreased to </= 3/10 to tolerate daily tasks with less pain.    Time 3   Period Weeks   Status On-going   CC Short Term Goal  #2   Title Patient will be able to reduce edema by >/= 25% per patient report in left breast.   Time 3   Period Weeks   Status On-going   CC Short Term Goal  #3   Title Patient will be able to increase left shoulder active flexion ROM to >/= 110 degrees for increased use of left arm   Time 3   Period Weeks   Status Achieved  CC Short Term Goal  #4   Title Patient will be able to ncrease left shoulder active abduction ROM to >/= 110 degrees for increased use of left arm   Time 3   Period Weeks   Status Achieved          Long Term Clinic Goals - 09/04/14 1219    CC Long Term Goal  #1   Title Patient will be able to verbalize good understanding of Maintenance Phase of treatment including manual lymph drainage, use of compression and lymphedema risk reduction practices.   Time 6   Period Weeks   Status On-going   CC Long Term Goal  #2   Title Patient will be able to reduce edema by >/= 40% per patient reoprt in left breast   Time 6   Period Weeks   Status On-going   CC Long Term Goal  #3   Title Patient will be able to be independent with home exercise program   Time 6   Period Weeks   Status On-going   CC Long Term Goal  #4   Title Patient will be able to report overall pain decreased >/= 50% to toelrate daily tasks with less pain   Time 6   Period Weeks   Status On-going   CC Long Term Goal  #5   Title Patient will be able to increase left shoulder active flexion to >/= 130 degrees for increased functional use of arm.   Time 6   Period Weeks   Status Achieved   CC Long Term Goal  #6   Title Patient will be able to increase left shoulder active abduction to >/= 130 degrees for increased functional use of arm.   Time 6   Period Weeks   Status Achieved   Additional Goals   Additional Goals Yes          Lakota Markgraf,MARTI COOPER 09/04/2014, 12:26 PM

## 2014-09-08 ENCOUNTER — Ambulatory Visit: Payer: Medicaid Other | Admitting: Physical Therapy

## 2014-09-08 ENCOUNTER — Telehealth: Payer: Self-pay | Admitting: Physical Therapy

## 2014-09-08 ENCOUNTER — Encounter: Payer: Self-pay | Admitting: Physical Therapy

## 2014-09-08 ENCOUNTER — Encounter: Payer: Self-pay | Admitting: *Deleted

## 2014-09-08 ENCOUNTER — Telehealth: Payer: Self-pay | Admitting: *Deleted

## 2014-09-08 DIAGNOSIS — M25512 Pain in left shoulder: Secondary | ICD-10-CM

## 2014-09-08 DIAGNOSIS — I890 Lymphedema, not elsewhere classified: Secondary | ICD-10-CM

## 2014-09-08 NOTE — Patient Instructions (Addendum)
Diaphragmatic Breathing - Supine   Hands on top of navel, lips closed, breathe in through nose filling stomach with air, navel moves out toward hands. Exhale through puckered lips, hands follow exhalation in. Repeat _5__ times. Rest 30___ seconds between repeats. Do _1__ times per day.  Copyright  VHI. All rights reserved.  Axilla - One at a Time   Using full weight of flat hand and fingers at center of uninvolved armpit, make _10__ in-place circles. Repeat on involved breast side. Do __1_ times per day.  Copyright  VHI. All rights reserved.  Axilla to Axilla - Clear   On uninvolved side, make _10__ in-place circles from involved armpit (left) across upper trunk to uninvolved armpit (right). Do _1__ times per day.  Copyright  VHI. All rights reserved.  Upper Half - Clear   On involved side, imagine upper breast in two halves. In left half make __10_ very light in-place circles in sections from collarbone to nipple. Repeat in right upper half. Repeat _5__ times. Do _1__ times per day.  Copyright  VHI. All rights reserved.  Lower Half - Clear   On involved side, imagine lower breast in two halves. In left half make __10_ very light in-place circles in sections from underside to nipple. Repeat in right lower half. Repeat _5__ times. Do _1__ times per day.  Copyright  VHI. All rights reserved.  Cane Overhead - Supine   Hold cane at thighs with both hands, extend arms straight over head. Hold _3__ seconds. Repeat _10__ times. Do _2__ times per day.  Copyright  VHI. All rights reserved.  Cane Majorette - Supine   Holding cane majorette style down across trunk, raise cane diagonally over and beyond shoulder. Repeat __10_ times. Reverse hand placement, repeat to other side. Do _2__ times per day.    Using full weight of flat hand and fingers at center of uninvolved armpit, make ___ in-place circles. Repeat on involved breast side. Do ___ times per day.  Copyright   VHI. All rights reserved.

## 2014-09-08 NOTE — Telephone Encounter (Signed)
Per pharmacy I have schedueld appt

## 2014-09-08 NOTE — Therapy (Signed)
Physical Therapy Treatment  Patient Details  Name: Laura Matthews MRN: [id number] Date of Birth: 1966-08-01  Encounter Date: 09/08/2014      PT End of Session - 09/08/14 1507    Visit Number 4   Number of Visits 4   Date for PT Re-Evaluation 09/08/14   PT Start Time 1336   PT Stop Time 1450   PT Time Calculation (min) 74 min   Activity Tolerance Patient limited by fatigue      Past Medical History  Diagnosis Date  . Mental disorder     depression-  . Arthritis   . Hot flashes   . Head ache   . Malignant neoplasm of breast (female), unspecified site   . Family history of malignant neoplasm of breast   . Depression     pt denies   . Anemia     low iron    Past Surgical History  Procedure Laterality Date  . Knee surgery  1995    right  . Foot surgery  2011    rt  . Tubal ligation    . Uterine fibroid surgery  2005    ablation  . Breast lumpectomy with sentinel lymph node biopsy Left 06/11/2014    Procedure: BREAST LUMPECTOMY WITH SENTINEL LYMPH NODE BX, POSSIBLE AXILLARY LYMPH NODE DISSECTION;  Surgeon: Stark Klein, MD;  Location: [facility];  Service: General;  Laterality: Left;  . Portacath placement N/A 07/02/2014    Procedure: INSERTION PORT-A-CATH;  Surgeon: Stark Klein, MD;  Location: [facility];  Service: General;  Laterality: N/A;  . Incision and drainage Left 07/02/2014    Procedure: Drainage left breast seroma;  Surgeon: Stark Klein, MD;  Location: [facility];  Service: General;  Laterality: Left;  . Evacuation breast hematoma Left 07/10/2014    Procedure: EVACUATION OF LEFT BREAST HEMATOMA ;  Surgeon: Stark Klein, MD;  Location: [facility];  Service: General;  Laterality: Left;    There were no vitals taken for this visit.  Visit Diagnosis:  Lymphedema  Pain in joint, shoulder region, left        [facility] PT Assessment - 09/08/14 1424    AROM   Left Shoulder Flexion 133 Degrees   Left Shoulder ABduction 140  Degrees          [facility] Adult PT Treatment/Exercise - 09/08/14 1345    Shoulder Exercises: Supine   Flexion AAROM;Both;10 reps  Supine cane with verbal and manual assist for proper techniq   ABduction AAROM;Both;10 reps  Supine cane with verbal and manual assist for proper techniq   Manual Therapy   Manual Therapy Edema management    Manual therapy: Manual lymph drainage for left breast in supine as follows: short neck, right axillary and left  inguinal nodes, left breast redirecting along pathways. Increased time spent with stationary circles on lateral left Breast to reduce fibrotic tissue. Passive range of motion to left shoulder in supine focused on flexion and abduction to patient tolerance.      Education - 09/08/14 1506    Education provided Yes   Education Details Self manual lymph drainage for left breast and supine cane exercises for flexion and abduction   Education Details Patient   Methods Explanation;Demonstration;Tactile cues;Verbal cues;Handout   Comprehension Verbalized understanding;Returned demonstration           Plan - 09/08/14 1508    Clinical Impression Statement Patient was seen today for her final PT visit as limited by Medicaid  approving 3 treatments.  She became fatigued toward the end of treatment as she is tired from chemo side effects.  She had notably softened left breast today with a good reduction in swelling.  This is likely due to her recent manual lymph drainage session and her new compression bra.  Her ROM in her left shoulder is improved and she si independent with her home exercises.  She is pelased with her progress in a short amount of time and limited visits.   PT Treatment/Interventions Therapeutic exercise;Manual lymph drainage;Manual techniques;Passive range of motion;Patient/family education   PT Home Exercise Plan Issued shoulder cane exercises today.   Consulted and Agree with Plan of Care Patient   PT Plan Patient will be  discharged today due to insurance constraints but I will continue to help her in obtaining a Flexitouch compression pump for her left breast edema.        Problem List Patient Active Problem List   Diagnosis Date Noted  . Neutropenia 08/05/2014  . Lymphedema of breast 08/05/2014  . Breast cancer metastasized to axillary lymph node 06/11/2014  . Family history of malignant neoplasm of breast   . Breast cancer of upper-outer quadrant of left female breast 05/19/2014  . Opiate dependence 05/01/2012    Class: Acute  . Homeless 05/01/2012    Class: Acute            LYMPHEDEMA/ONCOLOGY QUESTIONNAIRE - 09/08/14 1429    10 cm Proximal to Olecranon Process 27.3   Olecranon Process 24   10 cm Proximal to Ulnar Styloid Process 20.4   Just Proximal to Ulnar Styloid Process 15.8   Across Hand at [employer] 18.8   At [place] of 2nd Digit 6              Short Term Clinic Goals - 09/08/14 1514    CC Short Term Goal  #1   Title Patient will be able to report overall pain decreased to </= 3/10 to tolerate daily tasks with less pain.   Time 3   Period Weeks   Status Not Met  She has no pain today but continues to have ongoing pain at times in her left breast.   CC Short Term Goal  #2   Title Patient will be able to reduce edema by >/= 25% per patient report in left breast.   Time 3   Period Weeks   Status Achieved  Patient believes her left breast is atl elast 25% less swollen.   CC Short Term Goal  #3   Title Patient will be able to increase left shoulder active flexion ROM to >/= 110 degrees for increased use of left arm   Time 3   Period Weeks   Status Achieved   CC Short Term Goal  #4   Title Patient will be able to ncrease left shoulder active abduction ROM to >/= 110 degrees for increased use of left arm   Time 3   Period Weeks   Status Achieved          Long Term Clinic Goals - 09/08/14 1516    CC Long Term Goal  #1   Title Patient will be able to  verbalize good understanding of Maintenance Phase of treatment including manual lymph drainage, use of compression and lymphedema risk reduction practices.   Time 6   Period Weeks   Status Achieved   CC Long Term Goal  #2   Title Patient will be able to reduce  edema by >/= 40% per patient report in left breast   Time 6   Period Weeks   Status Not Met  Patient believes swelling is 25% improved.   CC Long Term Goal  #3   Title Patient will be able to be independent with home exercise program   Time 6   Period Weeks   Status Achieved   CC Long Term Goal  #4   Title Patient will be able to report overall pain decreased >/= 50% to toelrate daily tasks with less pain   Time 6   Period Weeks   Status Partially Met  Patient feels possible 25% reduction in pain.   CC Long Term Goal  #5   Title Patient will be able to increase left shoulder active flexion to >/= 130 degrees for increased functional use of arm.   Time 6   Period Weeks   Status Achieved   CC Long Term Goal  #6   Title Patient will be able to increase left shoulder active abduction to >/= 130 degrees for increased functional use of arm.   Time 6   Period Weeks   Status Achieved       PHYSICAL THERAPY DISCHARGE SUMMARY  Visits from Start of Care: 4 Current functional level related to goals / functional outcomes: Majority of goals met as indicated above.  Reasons documented as above for those not met.   Remaining deficits: She continues to have significant swelling present in left breast and limited left shoulder range of motion. However, she has been educated on how she can continue her therapy independently at home as we are  Limited by her insurance constraints.   Education / Equipment: She has been educated on a home exercise program for shoulder range of motion and for self breast manual lymph  Drainage.  She has obtained a compression bra with a Jovi pack to reduce swelling and already has nice results.    We  are in the process of trying to obtain a Flexitouch compression pump to reduce swelling and for ongoing management of her severe breast swelling. Plan: Patient agrees to discharge.  Patient goals were partially met. Patient is being discharged due to financial reasons.  ?????         SMITH,MARTI COOPER 09/08/2014, 3:20 PM

## 2014-09-09 ENCOUNTER — Ambulatory Visit (HOSPITAL_BASED_OUTPATIENT_CLINIC_OR_DEPARTMENT_OTHER): Payer: Medicaid Other | Admitting: Oncology

## 2014-09-09 ENCOUNTER — Ambulatory Visit (HOSPITAL_BASED_OUTPATIENT_CLINIC_OR_DEPARTMENT_OTHER): Payer: Medicaid Other

## 2014-09-09 ENCOUNTER — Other Ambulatory Visit (HOSPITAL_BASED_OUTPATIENT_CLINIC_OR_DEPARTMENT_OTHER): Payer: Medicaid Other

## 2014-09-09 VITALS — BP 144/74 | HR 106 | Temp 98.5°F | Resp 20 | Ht 70.0 in | Wt 178.7 lb

## 2014-09-09 DIAGNOSIS — Z170 Estrogen receptor positive status [ER+]: Secondary | ICD-10-CM

## 2014-09-09 DIAGNOSIS — Z5111 Encounter for antineoplastic chemotherapy: Secondary | ICD-10-CM

## 2014-09-09 DIAGNOSIS — C773 Secondary and unspecified malignant neoplasm of axilla and upper limb lymph nodes: Secondary | ICD-10-CM

## 2014-09-09 DIAGNOSIS — C50412 Malignant neoplasm of upper-outer quadrant of left female breast: Secondary | ICD-10-CM

## 2014-09-09 DIAGNOSIS — C50912 Malignant neoplasm of unspecified site of left female breast: Secondary | ICD-10-CM

## 2014-09-09 DIAGNOSIS — C50919 Malignant neoplasm of unspecified site of unspecified female breast: Secondary | ICD-10-CM

## 2014-09-09 LAB — CBC WITH DIFFERENTIAL/PLATELET
BASO%: 0.3 % (ref 0.0–2.0)
Basophils Absolute: 0.1 10*3/uL (ref 0.0–0.1)
EOS ABS: 0 10*3/uL (ref 0.0–0.5)
EOS%: 0 % (ref 0.0–7.0)
HCT: 32.1 % — ABNORMAL LOW (ref 34.8–46.6)
HGB: 10.1 g/dL — ABNORMAL LOW (ref 11.6–15.9)
LYMPH%: 4.7 % — ABNORMAL LOW (ref 14.0–49.7)
MCH: 27.9 pg (ref 25.1–34.0)
MCHC: 31.6 g/dL (ref 31.5–36.0)
MCV: 88.3 fL (ref 79.5–101.0)
MONO#: 1.2 10*3/uL — AB (ref 0.1–0.9)
MONO%: 5.3 % (ref 0.0–14.0)
NEUT%: 89.7 % — ABNORMAL HIGH (ref 38.4–76.8)
NEUTROS ABS: 20.2 10*3/uL — AB (ref 1.5–6.5)
Platelets: 192 10*3/uL (ref 145–400)
RBC: 3.63 10*6/uL — ABNORMAL LOW (ref 3.70–5.45)
RDW: 16.6 % — AB (ref 11.2–14.5)
WBC: 22.6 10*3/uL — ABNORMAL HIGH (ref 3.9–10.3)
lymph#: 1.1 10*3/uL (ref 0.9–3.3)

## 2014-09-09 LAB — COMPREHENSIVE METABOLIC PANEL (CC13)
ALT: 25 U/L (ref 0–55)
ANION GAP: 8 meq/L (ref 3–11)
AST: 13 U/L (ref 5–34)
Albumin: 3 g/dL — ABNORMAL LOW (ref 3.5–5.0)
Alkaline Phosphatase: 188 U/L — ABNORMAL HIGH (ref 40–150)
BUN: 8.6 mg/dL (ref 7.0–26.0)
CHLORIDE: 99 meq/L (ref 98–109)
CO2: 30 meq/L — AB (ref 22–29)
CREATININE: 0.8 mg/dL (ref 0.6–1.1)
Calcium: 9.7 mg/dL (ref 8.4–10.4)
Glucose: 386 mg/dl — ABNORMAL HIGH (ref 70–140)
Potassium: 4.3 mEq/L (ref 3.5–5.1)
SODIUM: 136 meq/L (ref 136–145)
TOTAL PROTEIN: 7 g/dL (ref 6.4–8.3)

## 2014-09-09 LAB — HIV ANTIBODY (ROUTINE TESTING W REFLEX): HIV 1&2 Ab, 4th Generation: NONREACTIVE

## 2014-09-09 MED ORDER — DOXORUBICIN HCL CHEMO IV INJECTION 2 MG/ML
60.0000 mg/m2 | Freq: Once | INTRAVENOUS | Status: AC
  Administered 2014-09-09: 118 mg via INTRAVENOUS
  Filled 2014-09-09: qty 59

## 2014-09-09 MED ORDER — DEXAMETHASONE SODIUM PHOSPHATE 20 MG/5ML IJ SOLN
12.0000 mg | Freq: Once | INTRAMUSCULAR | Status: AC
  Administered 2014-09-09: 12 mg via INTRAVENOUS

## 2014-09-09 MED ORDER — HEPARIN SOD (PORK) LOCK FLUSH 100 UNIT/ML IV SOLN
500.0000 [IU] | Freq: Once | INTRAVENOUS | Status: AC | PRN
  Administered 2014-09-09: 500 [IU]
  Filled 2014-09-09: qty 5

## 2014-09-09 MED ORDER — IBUPROFEN 200 MG PO TABS: 200.0000 mg | ORAL_TABLET | Freq: Four times a day (QID) | ORAL | Status: DC | PRN

## 2014-09-09 MED ORDER — SODIUM CHLORIDE 0.9 % IJ SOLN
10.0000 mL | INTRAMUSCULAR | Status: DC | PRN
  Administered 2014-09-09: 10 mL
  Filled 2014-09-09: qty 10

## 2014-09-09 MED ORDER — DEXAMETHASONE SODIUM PHOSPHATE 20 MG/5ML IJ SOLN
INTRAMUSCULAR | Status: AC
  Filled 2014-09-09: qty 5

## 2014-09-09 MED ORDER — SODIUM CHLORIDE 0.9 % IV SOLN
150.0000 mg | Freq: Once | INTRAVENOUS | Status: AC
  Administered 2014-09-09: 150 mg via INTRAVENOUS
  Filled 2014-09-09: qty 5

## 2014-09-09 MED ORDER — SODIUM CHLORIDE 0.9 % IV SOLN
Freq: Once | INTRAVENOUS | Status: AC
  Administered 2014-09-09: 12:00:00 via INTRAVENOUS

## 2014-09-09 MED ORDER — PALONOSETRON HCL INJECTION 0.25 MG/5ML
INTRAVENOUS | Status: AC
  Filled 2014-09-09: qty 5

## 2014-09-09 MED ORDER — SODIUM CHLORIDE 0.9 % IV SOLN
600.0000 mg/m2 | Freq: Once | INTRAVENOUS | Status: AC
  Administered 2014-09-09: 1180 mg via INTRAVENOUS
  Filled 2014-09-09: qty 59

## 2014-09-09 MED ORDER — PALONOSETRON HCL INJECTION 0.25 MG/5ML
0.2500 mg | Freq: Once | INTRAVENOUS | Status: AC
  Administered 2014-09-09: 0.25 mg via INTRAVENOUS

## 2014-09-09 NOTE — Progress Notes (Signed)
[facility]  [phone] [phone]) [phone]     ID: [name] DOB: [birth year]  MR#: [id number]  QXI#:[id number]  Patient Care Team: [name], MD as PCP - General (Family Medicine) Stark Klein, MD as Consulting Physician (General Surgery) Chauncey Cruel, MD as Consulting Physician (Oncology) Thea Silversmith, MD as Consulting Physician (Radiation Oncology) [facility] Bunnie Pion, NP as Nurse Practitioner (Nurse Practitioner)  CHIEF COMPLAINT: Stage III left breast cancer  CURRENT TREATMENT:adjuvant chemotherapy  BREAST CANCER HISTORY: From the original intake note:  The patient herself palpated a mass in her left breast and brought it to the attention of [facility] NP 04/22/2014. She palpated a large nontender mass at the 4:00 position as well as a small tender mass in the 6:00 position of the left breast. The patient was set up for imaging at the breast Center where on 05/05/2014 she underwent bilateral diagnostic mammography and left breast ultrasonography. The mammogram showed an irregular mass in the outer left breast with no other suspicious findings. This was firm and fixed at the 3:00 position approximately 10 cm from the nipple. Ultrasound showed a hypoechoic irregular solid mass in this area, measuring 1.8 cm. Ultrasound of the left axilla showed 2 suspicious level I lymph nodes, the largest measuring 1 cm, with a thickened pole. A smaller lymph node measured 0.6 cm but had no visible fatty hilum.  On 05/09/2014 the patient underwent biopsy of the left breast mass (she refused biopsy of the left axilla) which showed ([id number]) and invasive ductal carcinoma, grade 2, estrogen receptor positive at 100%, with strong staining intensity, progesterone receptor 80% positive, with weak staining intensity, with an MIB-1 of 80%, and no HER-2 amplification.  On 05/16/2014 the patient underwent bilateral breast MRI. This showed a 1.7 cm irregular enhancing  mass at the 3:00 position of the left breast associated with the biopsy cleft. There were 3 level I left axillary lymph nodes with thickened cortices. The largest measured 1.3 cm. There were no other findings of concern.  Her subsequent history is as detailed below.  INTERVAL HISTORY: Laura Matthews returns for follow up of her breast cancer accompanied by her daughter, Laura Matthews. Today is day 1 cycle 4 of 4 planned every 2 week cycles of doxorubicin and cyclophosphamide given with Neulasta on day 46, to be followed by weekly paclitaxel 12.  REVIEW OF SYSTEMS:  Laura Matthews did get to see the rehabilitation folks and was very touched that they had obtained some sports bras for her. She is clearly benefiting from the massage and at this point I am not planning to proceed to a repeat breast MRI as originally planned. Laura Matthews continues to have pain chiefly in her knees and ankles. I have agreed to give her a 2 week supply of Vicoprofen every 2 weeks (30 tablets). This does not constipate her confuse her. She tells me her disability has been approved but she only gets half of it because she still living in a shelter. She does have a voucher and just looking at a one bedroom duplex awfully straight. She is hoping to move there soon. She hopes to be able to quit smoking, but was not able to afford the nicotine patches that were ordered. A detailed review of systems today was otherwise stable.  PAST MEDICAL HISTORY: Past Medical History  Diagnosis Date  . Mental disorder     depression-  . Arthritis   . Hot flashes   . Head ache   . Malignant neoplasm  of breast (female), unspecified site   . Family history of malignant neoplasm of breast   . Depression     pt denies   . Anemia     low iron    PAST SURGICAL HISTORY: Past Surgical History  Procedure Laterality Date  . Knee surgery  1995    right  . Foot surgery  2011    rt  . Tubal ligation    . Uterine fibroid surgery  2005    ablation  . Breast lumpectomy  with sentinel lymph node biopsy Left 06/11/2014    Procedure: BREAST LUMPECTOMY WITH SENTINEL LYMPH NODE BX, POSSIBLE AXILLARY LYMPH NODE DISSECTION;  Surgeon: Stark Klein, MD;  Location: [facility];  Service: General;  Laterality: Left;  . Portacath placement N/A 07/02/2014    Procedure: INSERTION PORT-A-CATH;  Surgeon: Stark Klein, MD;  Location: [facility];  Service: General;  Laterality: N/A;  . Incision and drainage Left 07/02/2014    Procedure: Drainage left breast seroma;  Surgeon: Stark Klein, MD;  Location: [facility];  Service: General;  Laterality: Left;  . Evacuation breast hematoma Left 07/10/2014    Procedure: EVACUATION OF LEFT BREAST HEMATOMA ;  Surgeon: Stark Klein, MD;  [facility];  Service: General;  Laterality: Left;    FAMILY HISTORY Family History  Problem Relation Age of Onset  . Breast cancer Mother 43    currently 25; TAH/BSO d/t ?cervical ca at 69  . Breast cancer Maternal Aunt     dx 25s; currently in her late 45s  . Lung cancer Father     deceased 21   the patient's father died at the age of 7 from lung cancer. The patient's mother was diagnosed with breast cancer at the age of 67. She survives. The patient had 2 brothers, and 2 sisters. One brother may have a diagnosis of "stomach cancer"  GYNECOLOGIC HISTORY:  No LMP recorded. Patient has had an ablation. Menarche age 48 which is also when the patient first had a child. She is GX P3. She stopped having periods after laser ablation in 2005.  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 She has lived with her daughter Laura Matthews and her 4 children who are 62, 8, 5, and 4. 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 More recently the patient is living in a shelter. The patient's son Laura Matthews is a fast food cook in [place] and the patient's daughter Laura Matthews works as a Scientist, water quality, also in [place]. The patient has 7  grandchildren. She is not a church attender    ADVANCED DIRECTIVES: Not in place; this was discussed with the patient on her first visit, 05/21/2014, and she indicated she plans to name her daughter Laura Matthews as her healthcare power of attorney   HEALTH MAINTENANCE: History  Substance Use Topics  . Smoking status: Current Every Day Smoker -- 0.25 packs/day    Types: Cigarettes  . Smokeless tobacco: Never Used  . Alcohol Use: No     Comment: Percoset,Vicodin, Oxycotin     Colonoscopy:  PAP:  Bone density:  Lipid panel:  Allergies  Allergen Reactions  . Norco [Hydrocodone-Acetaminophen] Itching and Nausea Only    Pt reported allergy.    Current Outpatient Prescriptions  Medication Sig Dispense Refill  . dexamethasone (DECADRON) 4 MG tablet Take 2 tablets by mouth once a day on the day after chemotherapy and then take 2 tablets two times  a day for 2 days. Take with food. 30 tablet 1  . fluconazole (DIFLUCAN) 100 MG tablet Take 1 tablet (100 mg total) by mouth daily. 15 tablet 2  . HYDROcodone-ibuprofen (VICOPROFEN) 7.5-200 MG per tablet Take 1 tablet by mouth every 8 (eight) hours as needed for moderate pain. 30 tablet 0  . ibuprofen (ADVIL,MOTRIN) 200 MG tablet Take 400 mg by mouth every 6 (six) hours as needed for moderate pain.    Marland Kitchen lidocaine-prilocaine (EMLA) cream Apply 1 application topically as needed. 30 g 0  . LORazepam (ATIVAN) 0.5 MG tablet Take 1 tablet (0.5 mg total) by mouth every 6 (six) hours as needed (Nausea or vomiting). 30 tablet 0  . nicotine (NICODERM CQ) 21 mg/24hr patch Place 1 patch (21 mg total) onto the skin daily. 28 patch 1  . omeprazole (PRILOSEC) 40 MG capsule Take 1 capsule (40 mg total) by mouth daily. 30 capsule 2  . omeprazole (PRILOSEC) 40 MG capsule Take 1 capsule (40 mg total) by mouth at bedtime. 30 capsule 6  . ondansetron (ZOFRAN ODT) 8 MG disintegrating tablet Take 1 tablet (8 mg total) by mouth every 8 (eight) hours as needed for nausea or  vomiting. 20 tablet 0  . prochlorperazine (COMPAZINE) 10 MG tablet Take 1 tablet (10 mg total) by mouth every 6 (six) hours as needed (Nausea or vomiting). 30 tablet 1  . ranitidine (ZANTAC) 150 MG tablet Take 1 tablet (150 mg total) by mouth 2 (two) times daily. 60 tablet 0   No current facility-administered medications for this visit.   Facility-Administered Medications Ordered in Other Visits  Medication Dose Route Frequency Provider Last Rate Last Dose  . sodium chloride 0.9 % injection 10 mL  10 mL Intracatheter PRN Chauncey Cruel, MD   10 mL at 07/29/14 1704    OBJECTIVE: Middle-aged African American womanWho appears stated age 48 Vitals:   09/09/14 1103  BP: 144/74  Pulse: 106  Temp: 98.5 F (36.9 C)  Resp: 20     Body mass index is 25.64 kg/(m^2).    ECOG FS:1 - Symptomatic but completely ambulatory  Sclerae unicteric, pupils round and equal Oropharynx clear, , no thrush No cervical or supraclavicular adenopathy Lungs no rales or rhonchi Heart regular rate and rhythm Abd soft, nontender, positive bowel sounds MSK no focal spinal tenderness, no upper extremity lymphedema Neuro: nonfocal, well oriented, pressure of speech Breasts: deferred   LAB RESULTS:  CMP     Component Value Date/Time   NA 136 09/09/2014 1034   NA 138 06/03/2014 1326   K 4.3 09/09/2014 1034   K 3.5* 06/03/2014 1326   CL 95* 06/03/2014 1326   CO2 30* 09/09/2014 1034   CO2 30 06/03/2014 1326   GLUCOSE 386* 09/09/2014 1034   GLUCOSE 114* 06/03/2014 1326   BUN 8.6 09/09/2014 1034   BUN 14 06/03/2014 1326   CREATININE 0.8 09/09/2014 1034   CREATININE 0.63 06/03/2014 1326   CALCIUM 9.7 09/09/2014 1034   CALCIUM 10.2 06/03/2014 1326   PROT 7.0 09/09/2014 1034   PROT 8.6* 06/03/2014 1326   ALBUMIN 3.0* 09/09/2014 1034   ALBUMIN 4.7 06/03/2014 1326   AST 13 09/09/2014 1034   AST 21 06/03/2014 1326   ALT 25 09/09/2014 1034   ALT 21 06/03/2014 1326   ALKPHOS 188* 09/09/2014 1034    ALKPHOS 75 06/03/2014 1326   BILITOT <0.20 09/09/2014 1034   BILITOT 0.5 06/03/2014 1326   GFRNONAA >90 06/03/2014 1326   GFRAA >90  06/03/2014 1326    I No results found for: SPEP  Lab Results  Component Value Date   WBC 22.6* 09/09/2014   NEUTROABS 20.2* 09/09/2014   HGB 10.1* 09/09/2014   HCT 32.1* 09/09/2014   MCV 88.3 09/09/2014   PLT 192 09/09/2014      Chemistry      Component Value Date/Time   NA 136 09/09/2014 1034   NA 138 06/03/2014 1326   K 4.3 09/09/2014 1034   K 3.5* 06/03/2014 1326   CL 95* 06/03/2014 1326   CO2 30* 09/09/2014 1034   CO2 30 06/03/2014 1326   BUN 8.6 09/09/2014 1034   BUN 14 06/03/2014 1326   CREATININE 0.8 09/09/2014 1034   CREATININE 0.63 06/03/2014 1326      Component Value Date/Time   CALCIUM 9.7 09/09/2014 1034   CALCIUM 10.2 06/03/2014 1326   ALKPHOS 188* 09/09/2014 1034   ALKPHOS 75 06/03/2014 1326   AST 13 09/09/2014 1034   AST 21 06/03/2014 1326   ALT 25 09/09/2014 1034   ALT 21 06/03/2014 1326   BILITOT <0.20 09/09/2014 1034   BILITOT 0.5 06/03/2014 1326       No results found for: LABCA2  No components found for: [id number]  No results for input(s): INR in the last 168 hours.  Urinalysis    Component Value Date/Time   COLORURINE YELLOW 06/03/2014 1050   APPEARANCEUR CLEAR 06/03/2014 1050   LABSPEC >1.030* 06/03/2014 1050   PHURINE 6.0 06/03/2014 1050   GLUCOSEU NEGATIVE 06/03/2014 1050   HGBUR SMALL* 06/03/2014 1050   BILIRUBINUR SMALL* 06/03/2014 1050   KETONESUR NEGATIVE 06/03/2014 1050   PROTEINUR NEGATIVE 06/03/2014 1050   UROBILINOGEN 0.2 06/03/2014 1050   NITRITE NEGATIVE 06/03/2014 1050   LEUKOCYTESUR TRACE* 06/03/2014 1050    STUDIES: No results found.   ASSESSMENT: 48 y.o. [place] woman status post left breast upper outer quadrant biopsy 05/09/2014 for a clinicalT1c N1, stage IIA invasive ductal carcinoma, grade 2, estrogen and progesterone receptor positive, HER-2 nonamplified, with an  MIB-1 of 80%  (1) patient met with genetics counselor 05/29/2014 but decided against genetics testing   (2) tobacco abuse. The patient has been strongly urged to discontinue smoking. She was prescribed a nicotine patch 09/02/2014  (3) status post left lumpectomy and axillary lymph node dissection 06/11/2014 for a pT1c pN3, stage IIIC invasive ductal carcinoma, grade 3, with repeat HER-2 negative  (4) adjuvant chemotherapy to consist of doxorubicin and cyclophosphamide in dose dense fashion x4, with Neulasta support, started 07/29/2014, to be followed by paclitaxel weekly x12  (5) adjuvant radiation to follow chemotherapy  (6) anti-estrogens for 5-10 years to follow radiation  PLAN: Ginger we'll be completing the first, most important and most difficult part of her chemotherapy today. We normally see her on the week "off" but she preferred to "take a week off" so I will see her again in 2 weeks. At that point she will start her weekly Taxol treatments. Today we discussed the possible toxicities, side effects and complications of that agent.  I will as our social workers to contact her regarding our smoking cessation plans and to see if we can facilitate her getting some nicotine patches, which she cannot afford as they're not covered by her Medicaid.  I encouraged her to continue going to the physical therapy, and placed her for the effort she is making at getting through her treatments.  Lindyn has a good understanding of the overall plan. She agrees with that. She knows  the goal of treatment in her case is cure. She will call with any problems that may develop before her next visit here.   Chauncey Cruel, MD 09/09/2014

## 2014-09-09 NOTE — Patient Instructions (Signed)
Cyclophosphamide injection What is this medicine? CYCLOPHOSPHAMIDE (sye kloe FOSS fa mide) is a chemotherapy drug. It slows the growth of cancer cells. This medicine is used to treat many types of cancer like lymphoma, myeloma, leukemia, breast cancer, and ovarian cancer, to name a few. This medicine may be used for other purposes; ask your health care provider or pharmacist if you have questions. COMMON BRAND NAME(S): Cytoxan, Neosar What should I tell my health care provider before I take this medicine? They need to know if you have any of these conditions: -blood disorders -history of other chemotherapy -infection -kidney disease -liver disease -recent or ongoing radiation therapy -tumors in the bone marrow -an unusual or allergic reaction to cyclophosphamide, other chemotherapy, other medicines, foods, dyes, or preservatives -pregnant or trying to get pregnant -breast-feeding How should I use this medicine? This drug is usually given as an injection into a vein or muscle or by infusion into a vein. It is administered in a hospital or clinic by a specially trained health care professional. Talk to your pediatrician regarding the use of this medicine in children. Special care may be needed. Overdosage: If you think you have taken too much of this medicine contact a poison control center or emergency room at once. NOTE: This medicine is only for you. Do not share this medicine with others. What if I miss a dose? It is important not to miss your dose. Call your doctor or health care professional if you are unable to keep an appointment. What may interact with this medicine? This medicine may interact with the following medications: -amiodarone -amphotericin B -azathioprine -certain antiviral medicines for HIV or AIDS such as protease inhibitors (e.g., indinavir, ritonavir) and zidovudine -certain blood pressure medications such as benazepril, captopril, enalapril, fosinopril,  lisinopril, moexipril, monopril, perindopril, quinapril, ramipril, trandolapril -certain cancer medications such as anthracyclines (e.g., daunorubicin, doxorubicin), busulfan, cytarabine, paclitaxel, pentostatin, tamoxifen, trastuzumab -certain diuretics such as chlorothiazide, chlorthalidone, hydrochlorothiazide, indapamide, metolazone -certain medicines that treat or prevent blood clots like warfarin -certain muscle relaxants such as succinylcholine -cyclosporine -etanercept -indomethacin -medicines to increase blood counts like filgrastim, pegfilgrastim, sargramostim -medicines used as general anesthesia -metronidazole -natalizumab This list may not describe all possible interactions. Give your health care provider a list of all the medicines, herbs, non-prescription drugs, or dietary supplements you use. Also tell them if you smoke, drink alcohol, or use illegal drugs. Some items may interact with your medicine. What should I watch for while using this medicine? Visit your doctor for checks on your progress. This drug may make you feel generally unwell. This is not uncommon, as chemotherapy can affect healthy cells as well as cancer cells. Report any side effects. Continue your course of treatment even though you feel ill unless your doctor tells you to stop. Drink water or other fluids as directed. Urinate often, even at night. In some cases, you may be given additional medicines to help with side effects. Follow all directions for their use. Call your doctor or health care professional for advice if you get a fever, chills or sore throat, or other symptoms of a cold or flu. Do not treat yourself. This drug decreases your body's ability to fight infections. Try to avoid being around people who are sick. This medicine may increase your risk to bruise or bleed. Call your doctor or health care professional if you notice any unusual bleeding. Be careful brushing and flossing your teeth or using a  toothpick because you may get an infection or bleed   more easily. If you have any dental work done, tell your dentist you are receiving this medicine. You may get drowsy or dizzy. Do not drive, use machinery, or do anything that needs mental alertness until you know how this medicine affects you. Do not become pregnant while taking this medicine or for 1 year after stopping it. Women should inform their doctor if they wish to become pregnant or think they might be pregnant. Men should not father a child while taking this medicine and for 4 months after stopping it. There is a potential for serious side effects to an unborn child. Talk to your health care professional or pharmacist for more information. Do not breast-feed an infant while taking this medicine. This medicine may interfere with the ability to have a child. This medicine has caused ovarian failure in some women. This medicine has caused reduced sperm counts in some men. You should talk with your doctor or health care professional if you are concerned about your fertility. If you are going to have surgery, tell your doctor or health care professional that you have taken this medicine. What side effects may I notice from receiving this medicine? Side effects that you should report to your doctor or health care professional as soon as possible: -allergic reactions like skin rash, itching or hives, swelling of the face, lips, or tongue -low blood counts - this medicine may decrease the number of white blood cells, red blood cells and platelets. You may be at increased risk for infections and bleeding. -signs of infection - fever or chills, cough, sore throat, pain or difficulty passing urine -signs of decreased platelets or bleeding - bruising, pinpoint red spots on the skin, black, tarry stools, blood in the urine -signs of decreased red blood cells - unusually weak or tired, fainting spells, lightheadedness -breathing problems -dark  urine -dizziness -palpitations -swelling of the ankles, feet, hands -trouble passing urine or change in the amount of urine -weight gain -yellowing of the eyes or skin Side effects that usually do not require medical attention (report to your doctor or health care professional if they continue or are bothersome): -changes in nail or skin color -hair loss -missed menstrual periods -mouth sores -nausea, vomiting This list may not describe all possible side effects. Call your doctor for medical advice about side effects. You may report side effects to FDA at 1-800-FDA-1088. Where should I keep my medicine? This drug is given in a hospital or clinic and will not be stored at home. NOTE: This sheet is a summary. It may not cover all possible information. If you have questions about this medicine, talk to your doctor, pharmacist, or health care provider.  2015, Elsevier/[employer] Standard. (2012-08-31 16:22:58) Doxorubicin injection What is this medicine? DOXORUBICIN (dox oh ROO bi sin) is a chemotherapy drug. It is used to treat many kinds of cancer like Hodgkin's disease, leukemia, non-Hodgkin's lymphoma, neuroblastoma, sarcoma, and Wilms' tumor. It is also used to treat bladder cancer, breast cancer, lung cancer, ovarian cancer, stomach cancer, and thyroid cancer. This medicine may be used for other purposes; ask your health care provider or pharmacist if you have questions. COMMON BRAND NAME(S): Adriamycin, Adriamycin PFS, Adriamycin RDF, Rubex What should I tell my health care provider before I take this medicine? They need to know if you have any of these conditions: -blood disorders -heart disease, recent heart attack -infection (especially a virus infection such as chickenpox, cold sores, or herpes) -irregular heartbeat -liver disease -recent or ongoing radiation therapy -  an unusual or allergic reaction to doxorubicin, other chemotherapy agents, other medicines, foods, dyes, or  preservatives -pregnant or trying to get pregnant -breast-feeding How should I use this medicine? This drug is given as an infusion into a vein. It is administered in a hospital or clinic by a specially trained health care professional. If you have pain, swelling, burning or any unusual feeling around the site of your injection, tell your health care professional right away. Talk to your pediatrician regarding the use of this medicine in children. Special care may be needed. Overdosage: If you think you have taken too much of this medicine contact a poison control center or emergency room at once. NOTE: This medicine is only for you. Do not share this medicine with others. What if I miss a dose? It is important not to miss your dose. Call your doctor or health care professional if you are unable to keep an appointment. What may interact with this medicine? Do not take this medicine with any of the following medications: -cisapride -droperidol -halofantrine -pimozide -zidovudine This medicine may also interact with the following medications: -chloroquine -chlorpromazine -clarithromycin -cyclophosphamide -cyclosporine -erythromycin -medicines for depression, anxiety, or psychotic disturbances -medicines for irregular heart beat like amiodarone, bepridil, dofetilide, encainide, flecainide, propafenone, quinidine -medicines for seizures like ethotoin, fosphenytoin, phenytoin -medicines for nausea, vomiting like dolasetron, ondansetron, palonosetron -medicines to increase blood counts like filgrastim, pegfilgrastim, sargramostim -methadone -methotrexate -pentamidine -progesterone -vaccines -verapamil Talk to your doctor or health care professional before taking any of these medicines: -acetaminophen -aspirin -ibuprofen -ketoprofen -naproxen This list may not describe all possible interactions. Give your health care provider a list of all the medicines, herbs, non-prescription  drugs, or dietary supplements you use. Also tell them if you smoke, drink alcohol, or use illegal drugs. Some items may interact with your medicine. What should I watch for while using this medicine? Your condition will be monitored carefully while you are receiving this medicine. You will need important blood work done while you are taking this medicine. This drug may make you feel generally unwell. This is not uncommon, as chemotherapy can affect healthy cells as well as cancer cells. Report any side effects. Continue your course of treatment even though you feel ill unless your doctor tells you to stop. Your urine may turn red for a few days after your dose. This is not blood. If your urine is dark or brown, call your doctor. In some cases, you may be given additional medicines to help with side effects. Follow all directions for their use. Call your doctor or health care professional for advice if you get a fever, chills or sore throat, or other symptoms of a cold or flu. Do not treat yourself. This drug decreases your body's ability to fight infections. Try to avoid being around people who are sick. This medicine may increase your risk to bruise or bleed. Call your doctor or health care professional if you notice any unusual bleeding. Be careful brushing and flossing your teeth or using a toothpick because you may get an infection or bleed more easily. If you have any dental work done, tell your dentist you are receiving this medicine. Avoid taking products that contain aspirin, acetaminophen, ibuprofen, naproxen, or ketoprofen unless instructed by your doctor. These medicines may hide a fever. Men and women of childbearing age should use effective birth control methods while using taking this medicine. Do not become pregnant while taking this medicine. There is a potential for serious side effects to  an unborn child. Talk to your health care professional or pharmacist for more information. Do not  breast-feed an infant while taking this medicine. Do not let others touch your urine or other body fluids for 5 days after each treatment with this medicine. Caregivers should wear latex gloves to avoid touching body fluids during this time. There is a maximum amount of this medicine you should receive throughout your life. The amount depends on the medical condition being treated and your overall health. Your doctor will watch how much of this medicine you receive in your lifetime. Tell your doctor if you have taken this medicine before. What side effects may I notice from receiving this medicine? Side effects that you should report to your doctor or health care professional as soon as possible: -allergic reactions like skin rash, itching or hives, swelling of the face, lips, or tongue -low blood counts - this medicine may decrease the number of white blood cells, red blood cells and platelets. You may be at increased risk for infections and bleeding. -signs of infection - fever or chills, cough, sore throat, pain or difficulty passing urine -signs of decreased platelets or bleeding - bruising, pinpoint red spots on the skin, black, tarry stools, blood in the urine -signs of decreased red blood cells - unusually weak or tired, fainting spells, lightheadedness -breathing problems -chest pain -fast, irregular heartbeat -mouth sores -nausea, vomiting -pain, swelling, redness at site where injected -pain, tingling, numbness in the hands or feet -swelling of ankles, feet, or hands -unusual bleeding or bruising Side effects that usually do not require medical attention (report to your doctor or health care professional if they continue or are bothersome): -diarrhea -facial flushing -hair loss -loss of appetite -missed menstrual periods -nail discoloration or damage -red or watery eyes -red colored urine -stomach upset This list may not describe all possible side effects. Call your doctor for  medical advice about side effects. You may report side effects to FDA at 1-800-FDA-1088. Where should I keep my medicine? This drug is given in a hospital or clinic and will not be stored at home. NOTE: This sheet is a summary. It may not cover all possible information. If you have questions about this medicine, talk to your doctor, pharmacist, or health care provider.  2015, Elsevier/Gold Standard. (2013-02-12 09:54:34)

## 2014-09-09 NOTE — Progress Notes (Signed)
Positive blood return noted before, during, and after adriamycin push per admin protocol.

## 2014-09-10 ENCOUNTER — Ambulatory Visit (HOSPITAL_BASED_OUTPATIENT_CLINIC_OR_DEPARTMENT_OTHER): Payer: Medicaid Other

## 2014-09-10 DIAGNOSIS — Z5189 Encounter for other specified aftercare: Secondary | ICD-10-CM

## 2014-09-10 DIAGNOSIS — C50412 Malignant neoplasm of upper-outer quadrant of left female breast: Secondary | ICD-10-CM

## 2014-09-10 DIAGNOSIS — C50919 Malignant neoplasm of unspecified site of unspecified female breast: Secondary | ICD-10-CM

## 2014-09-10 DIAGNOSIS — C773 Secondary and unspecified malignant neoplasm of axilla and upper limb lymph nodes: Secondary | ICD-10-CM

## 2014-09-10 DIAGNOSIS — C50812 Malignant neoplasm of overlapping sites of left female breast: Secondary | ICD-10-CM

## 2014-09-10 MED ORDER — PEGFILGRASTIM INJECTION 6 MG/0.6ML ~~LOC~~
6.0000 mg | PREFILLED_SYRINGE | Freq: Once | SUBCUTANEOUS | Status: AC
  Administered 2014-09-10: 6 mg via SUBCUTANEOUS
  Filled 2014-09-10: qty 0.6

## 2014-09-17 ENCOUNTER — Emergency Department (HOSPITAL_COMMUNITY)
Admission: EM | Admit: 2014-09-17 | Discharge: 2014-09-17 | Disposition: A | Discharge status: Home / Self Care | Payer: Medicaid Other | Attending: Emergency Medicine | Admitting: Emergency Medicine

## 2014-09-17 ENCOUNTER — Encounter (HOSPITAL_COMMUNITY): Payer: Self-pay

## 2014-09-17 ENCOUNTER — Other Ambulatory Visit: Payer: Self-pay | Admitting: Oncology

## 2014-09-17 ENCOUNTER — Emergency Department (HOSPITAL_COMMUNITY): Payer: Medicaid Other

## 2014-09-17 DIAGNOSIS — Z862 Personal history of diseases of the blood and blood-forming organs and certain disorders involving the immune mechanism: Secondary | ICD-10-CM | POA: Insufficient documentation

## 2014-09-17 DIAGNOSIS — Z79899 Other long term (current) drug therapy: Secondary | ICD-10-CM | POA: Insufficient documentation

## 2014-09-17 DIAGNOSIS — R059 Cough, unspecified: Secondary | ICD-10-CM

## 2014-09-17 DIAGNOSIS — Z5111 Encounter for antineoplastic chemotherapy: Secondary | ICD-10-CM | POA: Diagnosis not present

## 2014-09-17 DIAGNOSIS — Z720 Tobacco use: Secondary | ICD-10-CM | POA: Diagnosis not present

## 2014-09-17 DIAGNOSIS — M2550 Pain in unspecified joint: Secondary | ICD-10-CM | POA: Diagnosis present

## 2014-09-17 DIAGNOSIS — Z853 Personal history of malignant neoplasm of breast: Secondary | ICD-10-CM | POA: Diagnosis not present

## 2014-09-17 DIAGNOSIS — Z792 Long term (current) use of antibiotics: Secondary | ICD-10-CM | POA: Diagnosis not present

## 2014-09-17 DIAGNOSIS — R05 Cough: Secondary | ICD-10-CM | POA: Insufficient documentation

## 2014-09-17 DIAGNOSIS — D696 Thrombocytopenia, unspecified: Secondary | ICD-10-CM | POA: Diagnosis not present

## 2014-09-17 DIAGNOSIS — D709 Neutropenia, unspecified: Secondary | ICD-10-CM | POA: Insufficient documentation

## 2014-09-17 DIAGNOSIS — Z760 Encounter for issue of repeat prescription: Secondary | ICD-10-CM | POA: Insufficient documentation

## 2014-09-17 DIAGNOSIS — Z8659 Personal history of other mental and behavioral disorders: Secondary | ICD-10-CM | POA: Insufficient documentation

## 2014-09-17 DIAGNOSIS — M791 Myalgia, unspecified site: Secondary | ICD-10-CM

## 2014-09-17 LAB — CBC WITH DIFFERENTIAL/PLATELET
BLASTS: 0 %
Band Neutrophils: 0 % (ref 0–10)
Basophils Absolute: 0.1 10*3/uL (ref 0.0–0.1)
Basophils Relative: 3 % — ABNORMAL HIGH (ref 0–1)
Eosinophils Absolute: 0 10*3/uL (ref 0.0–0.7)
Eosinophils Relative: 0 % (ref 0–5)
HCT: 29.7 % — ABNORMAL LOW (ref 36.0–46.0)
Hemoglobin: 9.8 g/dL — ABNORMAL LOW (ref 12.0–15.0)
LYMPHS ABS: 0.9 10*3/uL (ref 0.7–4.0)
LYMPHS PCT: 49 % — AB (ref 12–46)
MCH: 27.7 pg (ref 26.0–34.0)
MCHC: 33 g/dL (ref 30.0–36.0)
MCV: 83.9 fL (ref 78.0–100.0)
MYELOCYTES: 0 %
Metamyelocytes Relative: 0 %
Monocytes Absolute: 0.1 10*3/uL (ref 0.1–1.0)
Monocytes Relative: 7 % (ref 3–12)
NEUTROS ABS: 0.7 10*3/uL — AB (ref 1.7–7.7)
NEUTROS PCT: 41 % — AB (ref 43–77)
NRBC: 0 /100{WBCs}
PLATELETS: 67 10*3/uL — AB (ref 150–400)
PROMYELOCYTES ABS: 0 %
RBC: 3.54 MIL/uL — ABNORMAL LOW (ref 3.87–5.11)
RDW: 15.5 % (ref 11.5–15.5)
WBC: 1.8 10*3/uL — ABNORMAL LOW (ref 4.0–10.5)

## 2014-09-17 LAB — COMPREHENSIVE METABOLIC PANEL
ALK PHOS: 131 U/L — AB (ref 39–117)
ALT: 20 U/L (ref 0–35)
AST: 11 U/L (ref 0–37)
Albumin: 3.2 g/dL — ABNORMAL LOW (ref 3.5–5.2)
Anion gap: 11 (ref 5–15)
BUN: 11 mg/dL (ref 6–23)
CO2: 26 meq/L (ref 19–32)
Calcium: 9.2 mg/dL (ref 8.4–10.5)
Chloride: 99 mEq/L (ref 96–112)
Creatinine, Ser: 0.42 mg/dL — ABNORMAL LOW (ref 0.50–1.10)
GLUCOSE: 359 mg/dL — AB (ref 70–99)
POTASSIUM: 4 meq/L (ref 3.7–5.3)
Sodium: 136 mEq/L — ABNORMAL LOW (ref 137–147)
Total Bilirubin: <0.2 mg/dL — ABNORMAL LOW (ref 0.3–1.2)
Total Protein: 6.5 g/dL (ref 6.0–8.3)

## 2014-09-17 LAB — CBG MONITORING, ED: Glucose-Capillary: 290 mg/dL — ABNORMAL HIGH (ref 70–99)

## 2014-09-17 MED ORDER — ONDANSETRON HCL 4 MG/2ML IJ SOLN
4.0000 mg | Freq: Once | INTRAMUSCULAR | Status: AC
  Administered 2014-09-17: 4 mg via INTRAVENOUS
  Filled 2014-09-17: qty 2

## 2014-09-17 MED ORDER — HYDROMORPHONE HCL 1 MG/ML IJ SOLN
1.0000 mg | Freq: Once | INTRAMUSCULAR | Status: AC
  Administered 2014-09-17: 1 mg via INTRAVENOUS
  Filled 2014-09-17: qty 1

## 2014-09-17 MED ORDER — SODIUM CHLORIDE 0.9 % IV BOLUS (SEPSIS)
1000.0000 mL | Freq: Once | INTRAVENOUS | Status: AC
  Administered 2014-09-17: 1000 mL via INTRAVENOUS

## 2014-09-17 MED ORDER — OXYCODONE HCL 20 MG PO TABS: 20.0000 mg | ORAL_TABLET | ORAL | Status: DC | PRN

## 2014-09-17 NOTE — ED Provider Notes (Addendum)
CSN: [id number]     Arrival date & time 09/17/14  0450 History   First MD Initiated Contact with Patient 09/17/14 0602     Chief Complaint  Patient presents with  . Joint Pain  . Medication Refill     (Consider location/radiation/quality/duration/timing/severity/associated sxs/prior Treatment) The history is provided by the patient and medical records.     Patient presents breast cancer currently undergoing chemotherapy presents with generalized weakness, cramping, arthralgias x 1 week.  States she has been getting chemo once a week and usually has similar symptoms for 4-5 days after chemo and then it resolves. States this is more intense than usual and lasting longer.  She has a week off of chemo this week and is now out of her oxycodone 20mg .  Has a productive cough, sore throat, right ear pain, and a sore on the right side of her tongue with right lower molar pain.    Past Medical History  Diagnosis Date  . Mental disorder     depression-  . Arthritis   . Hot flashes   . Head ache   . Malignant neoplasm of breast (female), unspecified site   . Family history of malignant neoplasm of breast   . Depression     pt denies   . Anemia     low iron   Past Surgical History  Procedure Laterality Date  . Knee surgery  1995    right  . Foot surgery  2011    rt  . Tubal ligation    . Uterine fibroid surgery  2005    ablation  . Breast lumpectomy with sentinel lymph node biopsy Left 06/11/2014    Procedure: BREAST LUMPECTOMY WITH SENTINEL LYMPH NODE BX, POSSIBLE AXILLARY LYMPH NODE DISSECTION;  Surgeon: Stark Klein, MD;  Location: [facility];  Service: General;  Laterality: Left;  . Portacath placement N/A 07/02/2014    Procedure: INSERTION PORT-A-CATH;  Surgeon: Stark Klein, MD;  Location: [facility];  Service: General;  Laterality: N/A;  . Incision and drainage Left 07/02/2014    Procedure: Drainage left breast seroma;  Surgeon: Stark Klein, MD;   Location: [facility];  Service: General;  Laterality: Left;  . Evacuation breast hematoma Left 07/10/2014    Procedure: EVACUATION OF LEFT BREAST HEMATOMA ;  Surgeon: Stark Klein, MD;  Location: [facility];  Service: General;  Laterality: Left;   Family History  Problem Relation Age of Onset  . Breast cancer Mother 15    currently 65; TAH/BSO d/t ?cervical ca at 14  . Breast cancer Maternal Aunt     dx 69s; currently in her late 48s  . Lung cancer Father     deceased 59   History  Substance Use Topics  . Smoking status: Current Every Day Smoker -- 0.25 packs/day    Types: Cigarettes  . Smokeless tobacco: Never Used  . Alcohol Use: No     Comment: Percoset,Vicodin, Oxycotin   OB History    Gravida Para Term Preterm AB TAB SAB Ectopic Multiple Living   3 3 3       3      Review of Systems  All other systems reviewed and are negative.     Allergies  Norco  Home Medications   Prior to Admission medications   Medication Sig Start Date End Date Taking? Authorizing Provider  Alum & Mag Hydroxide-Simeth (MAGIC MOUTHWASH) SOLN Take 5 mLs by mouth 4 (four) times daily as needed for mouth  pain.   Yes Historical Provider, MD  dexamethasone (DECADRON) 4 MG tablet Take 2 tablets by mouth once a day on the day after chemotherapy and then take 2 tablets two times a day for 2 days. Take with food. 08/26/14  Yes Chauncey Cruel, MD  lidocaine-prilocaine (EMLA) cream Apply 1 application topically as needed. Patient taking differently: Apply 1 application topically daily as needed (port).  07/15/14  Yes Marcelino Duster, NP  LORazepam (ATIVAN) 0.5 MG tablet Take 1 tablet (0.5 mg total) by mouth every 6 (six) hours as needed (Nausea or vomiting). 07/15/14  Yes Chauncey Cruel, MD  omeprazole (PRILOSEC) 40 MG capsule Take 1 capsule (40 mg total) by mouth at bedtime. 09/02/14  Yes Chauncey Cruel, MD  ondansetron (ZOFRAN ODT) 8 MG disintegrating tablet Take 1 tablet (8 mg total) by  mouth every 8 (eight) hours as needed for nausea or vomiting. 08/26/14  Yes Chauncey Cruel, MD  Oxycodone HCl 20 MG TABS Take 20 mg by mouth every 4 (four) hours as needed (pain).   Yes Historical Provider, MD  prochlorperazine (COMPAZINE) 10 MG tablet Take 1 tablet (10 mg total) by mouth every 6 (six) hours as needed (Nausea or vomiting). 07/15/14  Yes Marcelino Duster, NP  ranitidine (ZANTAC) 150 MG tablet Take 1 tablet (150 mg total) by mouth 2 (two) times daily. Patient taking differently: Take 150 mg by mouth 2 (two) times daily as needed for heartburn.  06/03/14  Yes [place], NP  fluconazole (DIFLUCAN) 100 MG tablet Take 1 tablet (100 mg total) by mouth daily. Patient not taking: Reported on 09/17/2014 09/02/14   Chauncey Cruel, MD  HYDROcodone-ibuprofen (VICOPROFEN) 7.5-200 MG per tablet Take 1 tablet by mouth every 8 (eight) hours as needed for moderate pain. Patient not taking: Reported on 09/17/2014 08/26/14   Chauncey Cruel, MD  ibuprofen (ADVIL,MOTRIN) 200 MG tablet Take 1 tablet (200 mg total) by mouth every 6 (six) hours as needed for moderate pain. Patient not taking: Reported on 09/17/2014 09/09/14   Chauncey Cruel, MD  nicotine (NICODERM CQ) 21 mg/24hr patch Place 1 patch (21 mg total) onto the skin daily. Patient not taking: Reported on 09/17/2014 09/02/14   Chauncey Cruel, MD  omeprazole (PRILOSEC) 40 MG capsule Take 1 capsule (40 mg total) by mouth daily. Patient not taking: Reported on 09/17/2014 08/05/14   Marcelino Duster, NP   BP 135/67 mmHg  Pulse 103  Temp(Src) 98.7 F (37.1 C) (Oral)  Ht 5\' 10"  (1.778 m)  Wt 178 lb (80.74 kg)  BMI 25.54 kg/m2  SpO2 96% Physical Exam  Constitutional: She appears well-developed and well-nourished. No distress.  HENT:  Head: Normocephalic and atraumatic.  Right Ear: Tympanic membrane and ear canal normal.  Left Ear: Tympanic membrane and ear canal normal.  Mouth/Throat:    Eyes: Conjunctivae are normal.  Neck:  Normal range of motion. Neck supple.  Cardiovascular: Normal rate, regular rhythm and intact distal pulses.   Pulmonary/Chest: Effort normal and breath sounds normal. No respiratory distress. She has no wheezes. She has no rales.  Abdominal: Soft. She exhibits no distension. There is no tenderness. There is no rebound and no guarding.  Musculoskeletal: She exhibits no edema.  Joints without edema, erythema, warmth.   Neurological: She is alert.  Skin: She is not diaphoretic.  Psychiatric: She has a normal mood and affect. Her behavior is normal.  Nursing note and vitals reviewed.   ED Course  Procedures (including critical care time) Labs Review Labs Reviewed  COMPREHENSIVE METABOLIC PANEL - Abnormal; Notable for the following:    Sodium 136 (*)    Glucose, Bld 359 (*)    Creatinine, Ser 0.42 (*)    Albumin 3.2 (*)    Alkaline Phosphatase 131 (*)    Total Bilirubin <0.2 (*)    All other components within normal limits  CBC WITH DIFFERENTIAL - Abnormal; Notable for the following:    WBC 1.8 (*)    RBC 3.54 (*)    Hemoglobin 9.8 (*)    HCT 29.7 (*)    Platelets 67 (*)    Neutrophils Relative % 41 (*)    Lymphocytes Relative 49 (*)    Basophils Relative 3 (*)    Neutro Abs 0.7 (*)    All other components within normal limits  CBG MONITORING, ED - Abnormal; Notable for the following:    Glucose-Capillary 290 (*)    All other components within normal limits    Imaging Review Dg Chest 2 View  09/17/2014   CLINICAL DATA:  48 year old female productive cough for 1 month. Current history of stage II breast cancer undergoing chemotherapy. Initial encounter.  EXAM: CHEST  2 VIEW  COMPARISON:  07/02/2014 and earlier.  FINDINGS: Left chest porta cath is stable. Postoperative changes to the left axilla and chest wall re- identified. Lung volumes are within normal limits. No pneumothorax, pulmonary edema, pleural effusion or confluent pulmonary opacity. Normal cardiac size and mediastinal  contours. Visualized tracheal air column is within normal limits. No pulmonary nodule identified. No acute osseous abnormality identified.  IMPRESSION: No acute or metastatic cardiopulmonary abnormality identified.   Electronically Signed   By: Lars Pinks M.D.   On: 09/17/2014 07:20     EKG Interpretation None      Discussed pt with Dr Wyvonnia Dusky.   10:35 AM Discussed patient with Dr [name].  Per our conversation, patient's CBC results are as expected to even better than expected at this point in her chemotherapy cycle.  Is hyperglycemic likely from steroids - he states she can stop dexamethasone.    MDM   Final diagnoses:  Myalgia  Arthralgia  Cough  Neutropenia  Thrombocytopenia    Afebrile, nontoxic patient with body aches, joint pain, generalized weakness since her last chemotherapy session, more intense and lasting longer than usual.  Found to be neutropenic and thrombocytopenic, also slightly anemic.  Pt does received medication for this following her chemotherapy sessions.  Last session was one week ago.  Hyperglycemic, on steroids, improved with IVF.  Pt also given pain medication.  Pt feeling better.  Pt has mild cough.  She is afebrile and remained afebrile throughout the visit.  No chills/sweats.  Suspect early or mild viral illness.  CXR negative.  No CP or SOB.  Doubt PE.  Pt out of pain medication at home.  D/C home with #15 oxycodone 20mg  (home med refill), discontinue decadron, f/u Dr [name], oncology.   Discussed result, findings, treatment, and follow up  with patient.  Pt given return precautions.  Pt verbalizes understanding and agrees with plan.         Clayton Bibles, PA-C 09/17/14 [place], MD 09/17/14 [address] PA-C 09/17/14 [place], MD 09/17/14 1620

## 2014-09-17 NOTE — ED Notes (Signed)
Back from xray

## 2014-09-17 NOTE — ED Notes (Signed)
Notified RN of CBG 290

## 2014-09-17 NOTE — Progress Notes (Unsigned)
Calls from the ED work as he presented with abdominal cramping or hyperglycemia. We will make sure her to omit dexamethasone from her anti-emetics when she starts her next chemotherapy.

## 2014-09-17 NOTE — ED Notes (Signed)
Pt states she is ready to leave, wanting to know what she is waiting for. Ambulating to the bathroom without difficulty.

## 2014-09-17 NOTE — ED Notes (Addendum)
Pt is a breast patient and receives chemo weekly, states she normally has a lot of pain afterwards but this time is worse and took last pill about 3am today. Pt needs refill on medications.

## 2014-09-17 NOTE — ED Notes (Signed)
Up to BR to void

## 2014-09-17 NOTE — Discharge Instructions (Signed)
Read the information below.  Use the prescribed medication as directed.  Please discuss all new medications with your pharmacist.  You may return to the Emergency Department at any time for worsening condition or any new symptoms that concern you.  If you develop fevers, chest pain, shortness of breath, or uncontrolled pain, return to the Emergency Department immediately for a recheck.   Please stop taking your Decadron.

## 2014-09-17 NOTE — ED Notes (Signed)
Breakfast given.  

## 2014-09-29 ENCOUNTER — Telehealth: Payer: Self-pay | Admitting: Oncology

## 2014-09-29 ENCOUNTER — Telehealth: Payer: Self-pay | Admitting: *Deleted

## 2014-09-29 ENCOUNTER — Other Ambulatory Visit: Payer: Self-pay | Admitting: *Deleted

## 2014-09-29 ENCOUNTER — Other Ambulatory Visit: Payer: Self-pay | Admitting: Oncology

## 2014-09-29 NOTE — Telephone Encounter (Signed)
Per staff message and POF I have scheduled appts. Advised scheduler of appts, and to move other appts. JMW

## 2014-09-29 NOTE — Telephone Encounter (Signed)
received call from [place] in HIM re pt calling to check for next appt. checked last pof (11/10) and scheduled appts per pof. pt given next appt for 12/3 lb/HF by Tiffany and tx to start 12/7. pt to be given new schedule 12/3.

## 2014-09-29 NOTE — Telephone Encounter (Signed)
S/W PATIENT AND GAVE APPT FOR 12/03 LAB @ 1:45, 2:15 HF.

## 2014-10-02 ENCOUNTER — Other Ambulatory Visit (HOSPITAL_BASED_OUTPATIENT_CLINIC_OR_DEPARTMENT_OTHER): Payer: Medicaid Other

## 2014-10-02 ENCOUNTER — Ambulatory Visit (HOSPITAL_BASED_OUTPATIENT_CLINIC_OR_DEPARTMENT_OTHER): Payer: Medicaid Other | Admitting: Oncology

## 2014-10-02 ENCOUNTER — Telehealth: Payer: Self-pay | Admitting: Oncology

## 2014-10-02 VITALS — BP 145/93 | HR 87 | Temp 98.1°F | Resp 18 | Ht 70.0 in | Wt 186.6 lb

## 2014-10-02 DIAGNOSIS — C50412 Malignant neoplasm of upper-outer quadrant of left female breast: Secondary | ICD-10-CM

## 2014-10-02 DIAGNOSIS — C50912 Malignant neoplasm of unspecified site of left female breast: Secondary | ICD-10-CM

## 2014-10-02 DIAGNOSIS — C773 Secondary and unspecified malignant neoplasm of axilla and upper limb lymph nodes: Secondary | ICD-10-CM

## 2014-10-02 DIAGNOSIS — C50812 Malignant neoplasm of overlapping sites of left female breast: Secondary | ICD-10-CM

## 2014-10-02 DIAGNOSIS — Z170 Estrogen receptor positive status [ER+]: Secondary | ICD-10-CM

## 2014-10-02 LAB — CBC WITH DIFFERENTIAL/PLATELET
BASO%: 0.5 % (ref 0.0–2.0)
BASOS ABS: 0.1 10*3/uL (ref 0.0–0.1)
EOS%: 0.4 % (ref 0.0–7.0)
Eosinophils Absolute: 0 10*3/uL (ref 0.0–0.5)
HCT: 29 % — ABNORMAL LOW (ref 34.8–46.6)
HEMOGLOBIN: 9 g/dL — AB (ref 11.6–15.9)
LYMPH#: 1.5 10*3/uL (ref 0.9–3.3)
LYMPH%: 15 % (ref 14.0–49.7)
MCH: 27.6 pg (ref 25.1–34.0)
MCHC: 31 g/dL — AB (ref 31.5–36.0)
MCV: 89 fL (ref 79.5–101.0)
MONO#: 1.3 10*3/uL — ABNORMAL HIGH (ref 0.1–0.9)
MONO%: 13.1 % (ref 0.0–14.0)
NEUT#: 7.1 10*3/uL — ABNORMAL HIGH (ref 1.5–6.5)
NEUT%: 71 % (ref 38.4–76.8)
Platelets: 435 10*3/uL — ABNORMAL HIGH (ref 145–400)
RBC: 3.26 10*6/uL — AB (ref 3.70–5.45)
RDW: 18.5 % — AB (ref 11.2–14.5)
WBC: 10 10*3/uL (ref 3.9–10.3)

## 2014-10-02 LAB — COMPREHENSIVE METABOLIC PANEL (CC13)
ALBUMIN: 3.2 g/dL — AB (ref 3.5–5.0)
ALT: 37 U/L (ref 0–55)
AST: 27 U/L (ref 5–34)
Alkaline Phosphatase: 105 U/L (ref 40–150)
Anion Gap: 10 mEq/L (ref 3–11)
BUN: 6.1 mg/dL — ABNORMAL LOW (ref 7.0–26.0)
CHLORIDE: 106 meq/L (ref 98–109)
CO2: 27 mEq/L (ref 22–29)
CREATININE: 0.6 mg/dL (ref 0.6–1.1)
Calcium: 9 mg/dL (ref 8.4–10.4)
EGFR: >90 mL/min/{1.73_m2} (ref >90)
Glucose: 139 mg/dl (ref 70–140)
POTASSIUM: 3.7 meq/L (ref 3.5–5.1)
Sodium: 142 mEq/L (ref 136–145)
Total Bilirubin: <0.2 mg/dL (ref 0.20–1.20)
Total Protein: 6.5 g/dL (ref 6.4–8.3)

## 2014-10-02 MED ORDER — OXYCODONE-ACETAMINOPHEN 5-325 MG PO TABS: 1.0000 | ORAL_TABLET | Freq: Three times a day (TID) | ORAL | Status: DC | PRN

## 2014-10-02 NOTE — Progress Notes (Signed)
Laura Matthews  [phone] [phone]     ID: Laura Matthews DOB: [birth year]  MR#: [id number]  BFX#:[id number]  Patient Care Team: [name], MD as PCP - General (Family Medicine) Stark Klein, MD as Consulting Physician (General Surgery) Chauncey Cruel, MD as Consulting Physician (Oncology) Thea Silversmith, MD as Consulting Physician (Radiation Oncology) [facility] Bunnie Pion, NP as Nurse Practitioner (Nurse Practitioner)  CHIEF COMPLAINT: Stage III left breast cancer  CURRENT TREATMENT:adjuvant chemotherapy  BREAST CANCER HISTORY: From the original intake note:  The patient herself palpated a mass in her left breast and brought it to the attention of [facility] NP 04/22/2014. She palpated a large nontender mass at the 4:00 position as well as a small tender mass in the 6:00 position of the left breast. The patient was set up for imaging at the breast Center where on 05/05/2014 she underwent bilateral diagnostic mammography and left breast ultrasonography. The mammogram showed an irregular mass in the outer left breast with no other suspicious findings. This was firm and fixed at the 3:00 position approximately 10 cm from the nipple. Ultrasound showed a hypoechoic irregular solid mass in this area, measuring 1.8 cm. Ultrasound of the left axilla showed 2 suspicious level I lymph nodes, the largest measuring 1 cm, with a thickened pole. A smaller lymph node measured 0.6 cm but had no visible fatty hilum.  On 05/09/2014 the patient underwent biopsy of the left breast mass (she refused biopsy of the left axilla) which showed ([id number]) and invasive ductal carcinoma, grade 2, estrogen receptor positive at 100%, with strong staining intensity, progesterone receptor 80% positive, with weak staining intensity, with an MIB-1 of 80%, and no HER-2 amplification.  On 05/16/2014 the patient underwent bilateral breast MRI. This showed a 1.7 cm irregular enhancing  mass at the 3:00 position of the left breast associated with the biopsy cleft. There were 3 level I left axillary lymph nodes with thickened cortices. The largest measured 1.3 cm. There were no other findings of concern.  Her subsequent history is as detailed below.  INTERVAL HISTORY: Laura Matthews returns for follow up of her breast cancer.. Today is day 8 cycle 4 of 4 planned every 2 week cycles of doxorubicin and cyclophosphamide given with Neulasta on day 2, to be followed by weekly paclitaxel 12.-- On this social side she tells me she thinks she may be ready to move into her apartment within the next week or 2.  REVIEW OF SYSTEMS:  Meranda went to [place] with her children to visit her aunt for Thanksgiving's and that was fine. After returning she developed back and leg pain. This took her to the emergency room were exam was unrevealing. A chest x-ray was obtained because she had a cough and this was negative. She was given 15 xycodone tablets and because her sugar was found to be elevated her Decadron was stopped. Laura Matthews tells me her pain is really no better. She can barely walk because of the pain, she says. She also feels her left breast is more swollen. She has had no fever, no unusual headaches, no visual changes, mild nausea, but no vomiting. Has been no change in bowel or bladder habits. A detailed review of systems today was otherwise stable.  PAST MEDICAL HISTORY: Past Medical History  Diagnosis Date  . Mental disorder     depression-  . Arthritis   . Hot flashes   . Head ache   . Malignant neoplasm of breast (female), unspecified  site   . Family history of malignant neoplasm of breast   . Depression     pt denies   . Anemia     low iron    PAST SURGICAL HISTORY: Past Surgical History  Procedure Laterality Date  . Knee surgery  1995    right  . Foot surgery  2011    rt  . Tubal ligation    . Uterine fibroid surgery  2005    ablation  . Breast lumpectomy with sentinel lymph  node biopsy Left 06/11/2014    Procedure: BREAST LUMPECTOMY WITH SENTINEL LYMPH NODE BX, POSSIBLE AXILLARY LYMPH NODE DISSECTION;  Surgeon: Stark Klein, MD;  Location: [place];  Service: General;  Laterality: Left;  . Portacath placement N/A 07/02/2014    Procedure: INSERTION PORT-A-CATH;  Surgeon: Stark Klein, MD;  Location: [place];  Service: General;  Laterality: N/A;  . Incision and drainage Left 07/02/2014    Procedure: Drainage left breast seroma;  Surgeon: Stark Klein, MD;  Location: [place];  Service: General;  Laterality: Left;  . Evacuation breast hematoma Left 07/10/2014    Procedure: EVACUATION OF LEFT BREAST HEMATOMA ;  Surgeon: Stark Klein, MD;  Location: [facility];  Service: General;  Laterality: Left;    FAMILY HISTORY Family History  Problem Relation Age of Onset  . Breast cancer Mother 39    currently 24; TAH/BSO d/t ?cervical ca at 34  . Breast cancer Maternal Aunt     dx 54s; currently in her late 75s  . Lung cancer Father     deceased 63   the patient's father died at the age of 28 from lung cancer. The patient's mother was diagnosed with breast cancer at the age of 65. She survives. The patient had 2 brothers, and 2 sisters. One brother may have a diagnosis of "stomach cancer"  GYNECOLOGIC HISTORY:  No LMP recorded. Patient has had an ablation. Menarche age 7 which is also when the patient first had a child. She is GX P3. She stopped having periods after laser ablation in 2005.  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 She has lived with her daughter [name] and her 4 children who are 27, 8, 5, and 4. 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 More recently the patient is living in a shelter. The patient's son Laura Matthews is a fast food cook in [place] and the patient's daughter Laura Matthews works as a Scientist, water quality, also in [place]. The patient has 7 grandchildren. She is not a  church attender    ADVANCED DIRECTIVES: Not in place; this was discussed with the patient on her first visit, 05/21/2014, and she indicated she plans to name her daughter [name] as her healthcare power of attorney   HEALTH MAINTENANCE: History  Substance Use Topics  . Smoking status: Current Every Day Smoker -- 0.25 packs/day    Types: Cigarettes  . Smokeless tobacco: Never Used  . Alcohol Use: No     Comment: Percoset,Vicodin, Oxycotin     Colonoscopy:  PAP:  Bone density:  Lipid panel:  Allergies  Allergen Reactions  . Norco [Hydrocodone-Acetaminophen] Itching and Nausea Only    Pt reported allergy.    Current Outpatient Prescriptions  Medication Sig Dispense Refill  . Alum & Mag Hydroxide-Simeth (MAGIC MOUTHWASH) SOLN Take 5 mLs by mouth 4 (four) times daily as needed for mouth pain.    Marland Kitchen dexamethasone (DECADRON) 4 MG  tablet TAKE 2 TABLETS DAILY ON THE DAY AFTER CHEMO THEN 2 TABLETS 2 TIMES DAILY FOR 2 DAYS WITH FOOD 30 tablet 0  . fluconazole (DIFLUCAN) 100 MG tablet Take 1 tablet (100 mg total) by mouth daily. (Patient not taking: Reported on 09/17/2014) 15 tablet 2  . HYDROcodone-ibuprofen (VICOPROFEN) 7.5-200 MG per tablet Take 1 tablet by mouth every 8 (eight) hours as needed for moderate pain. (Patient not taking: Reported on 09/17/2014) 30 tablet 0  . ibuprofen (ADVIL,MOTRIN) 200 MG tablet Take 1 tablet (200 mg total) by mouth every 6 (six) hours as needed for moderate pain. (Patient not taking: Reported on 09/17/2014) 30 tablet 0  . lidocaine-prilocaine (EMLA) cream Apply 1 application topically as needed. (Patient taking differently: Apply 1 application topically daily as needed (port). ) 30 g 0  . LORazepam (ATIVAN) 0.5 MG tablet Take 1 tablet (0.5 mg total) by mouth every 6 (six) hours as needed (Nausea or vomiting). 30 tablet 0  . nicotine (NICODERM CQ) 21 mg/24hr patch Place 1 patch (21 mg total) onto the skin daily. (Patient not taking: Reported on 09/17/2014) 28  patch 1  . omeprazole (PRILOSEC) 40 MG capsule Take 1 capsule (40 mg total) by mouth daily. (Patient not taking: Reported on 09/17/2014) 30 capsule 2  . omeprazole (PRILOSEC) 40 MG capsule Take 1 capsule (40 mg total) by mouth at bedtime. 30 capsule 6  . ondansetron (ZOFRAN ODT) 8 MG disintegrating tablet Take 1 tablet (8 mg total) by mouth every 8 (eight) hours as needed for nausea or vomiting. 20 tablet 0  . Oxycodone HCl 20 MG TABS Take 1 tablet (20 mg total) by mouth every 4 (four) hours as needed (pain). 15 tablet 0  . prochlorperazine (COMPAZINE) 10 MG tablet Take 1 tablet (10 mg total) by mouth every 6 (six) hours as needed (Nausea or vomiting). 30 tablet 1  . ranitidine (ZANTAC) 150 MG tablet Take 1 tablet (150 mg total) by mouth 2 (two) times daily. (Patient taking differently: Take 150 mg by mouth 2 (two) times daily as needed for heartburn. ) 60 tablet 0   No current facility-administered medications for this visit.   Facility-Administered Medications Ordered in Other Visits  Medication Dose Route Frequency Provider Last Rate Last Dose  . sodium chloride 0.9 % injection 10 mL  10 mL Intracatheter PRN Chauncey Cruel, MD   10 mL at 07/29/14 1704    OBJECTIVE: Middle-aged [place] American woman who appears stated age 53 Vitals:   10/02/14 1320  BP: 145/93  Pulse: 87  Temp: 98.1 F (36.7 C)  Resp: 18     Body mass index is 26.77 kg/(m^2).    ECOG FS:1 - Symptomatic but completely ambulatory  Sclerae unicteric, EOMs intact Oropharynx clear, slightly dry No cervical or supraclavicular adenopathy Lungs no rales or rhonchi Heart regular rate and rhythm Abd soft, nontender, positive bowel sounds MSK no focal spinal tenderness, no upper extremity lymphedema, straight leg raise bilaterally to about 30, limited by pain Neuro: nonfocal, well oriented, depressed affect Breasts: The left breast is larger than the right, and there is some evidence of skin edema. There is no  suspicious mass palpable. The left axilla is benign. The right breast is unremarkable   LAB RESULTS:  CMP     Component Value Date/Time   NA 136* 09/17/2014 0700   NA 136 09/09/2014 1034   K 4.0 09/17/2014 0700   K 4.3 09/09/2014 1034   CL 99 09/17/2014 0700   CO2  26 09/17/2014 0700   CO2 30* 09/09/2014 1034   GLUCOSE 359* 09/17/2014 0700   GLUCOSE 386* 09/09/2014 1034   BUN 11 09/17/2014 0700   BUN 8.6 09/09/2014 1034   CREATININE 0.42* 09/17/2014 0700   CREATININE 0.8 09/09/2014 1034   CALCIUM 9.2 09/17/2014 0700   CALCIUM 9.7 09/09/2014 1034   PROT 6.5 09/17/2014 0700   PROT 7.0 09/09/2014 1034   ALBUMIN 3.2* 09/17/2014 0700   ALBUMIN 3.0* 09/09/2014 1034   AST 11 09/17/2014 0700   AST 13 09/09/2014 1034   ALT 20 09/17/2014 0700   ALT 25 09/09/2014 1034   ALKPHOS 131* 09/17/2014 0700   ALKPHOS 188* 09/09/2014 1034   BILITOT <0.2* 09/17/2014 0700   BILITOT <0.20 09/09/2014 1034   GFRNONAA >90 09/17/2014 0700   GFRAA >90 09/17/2014 0700    I No results found for: SPEP  Lab Results  Component Value Date   WBC 10.0 10/02/2014   NEUTROABS 7.1* 10/02/2014   HGB 9.0* 10/02/2014   HCT 29.0* 10/02/2014   MCV 89.0 10/02/2014   PLT 435* 10/02/2014      Chemistry      Component Value Date/Time   NA 136* 09/17/2014 0700   NA 136 09/09/2014 1034   K 4.0 09/17/2014 0700   K 4.3 09/09/2014 1034   CL 99 09/17/2014 0700   CO2 26 09/17/2014 0700   CO2 30* 09/09/2014 1034   BUN 11 09/17/2014 0700   BUN 8.6 09/09/2014 1034   CREATININE 0.42* 09/17/2014 0700   CREATININE 0.8 09/09/2014 1034      Component Value Date/Time   CALCIUM 9.2 09/17/2014 0700   CALCIUM 9.7 09/09/2014 1034   ALKPHOS 131* 09/17/2014 0700   ALKPHOS 188* 09/09/2014 1034   AST 11 09/17/2014 0700   AST 13 09/09/2014 1034   ALT 20 09/17/2014 0700   ALT 25 09/09/2014 1034   BILITOT <0.2* 09/17/2014 0700   BILITOT <0.20 09/09/2014 1034       No results found for: LABCA2  No components  found for: [id number]  No results for input(s): INR in the last 168 hours.  Urinalysis    Component Value Date/Time   COLORURINE YELLOW 06/03/2014 1050   APPEARANCEUR CLEAR 06/03/2014 1050   LABSPEC >1.030* 06/03/2014 1050   PHURINE 6.0 06/03/2014 1050   GLUCOSEU NEGATIVE 06/03/2014 1050   HGBUR SMALL* 06/03/2014 1050   BILIRUBINUR SMALL* 06/03/2014 1050   KETONESUR NEGATIVE 06/03/2014 1050   PROTEINUR NEGATIVE 06/03/2014 1050   UROBILINOGEN 0.2 06/03/2014 1050   NITRITE NEGATIVE 06/03/2014 1050   LEUKOCYTESUR TRACE* 06/03/2014 1050    STUDIES: Dg Chest 2 View  09/17/2014   CLINICAL DATA:  48 year old female productive cough for 1 month. Current history of stage II breast cancer undergoing chemotherapy. Initial encounter.  EXAM: CHEST  2 VIEW  COMPARISON:  07/02/2014 and earlier.  FINDINGS: Left chest porta cath is stable. Postoperative changes to the left axilla and chest wall re- identified. Lung volumes are within normal limits. No pneumothorax, pulmonary edema, pleural effusion or confluent pulmonary opacity. Normal cardiac size and mediastinal contours. Visualized tracheal air column is within normal limits. No pulmonary nodule identified. No acute osseous abnormality identified.  IMPRESSION: No acute or metastatic cardiopulmonary abnormality identified.   Electronically Signed   By: Lars Pinks M.D.   On: 09/17/2014 07:20     ASSESSMENT: 48 y.o. [place] woman status post left breast upper outer quadrant biopsy 05/09/2014 for a clinicalT1c N1, stage IIA invasive ductal carcinoma, grade  2, estrogen and progesterone receptor positive, HER-2 nonamplified, with an MIB-1 of 80%  (1) patient met with genetics counselor 05/29/2014 but decided against genetics testing   (2) tobacco abuse. The patient has been strongly urged to discontinue smoking. She was prescribed a nicotine patch 09/02/2014  (3) status post left lumpectomy and axillary lymph node dissection 06/11/2014 for a pT1c pN3,  stage IIIC invasive ductal carcinoma, grade 3, with repeat HER-2 negative  (4) adjuvant chemotherapy to consist of doxorubicin and cyclophosphamide in dose dense fashion x4, with Neulasta support, started 07/29/2014, to be followed by paclitaxel weekly x12  (5) adjuvant radiation to follow chemotherapy  (6) anti-estrogens for 5-10 years to follow radiation  PLAN: Margarite is ready to start her weekly paclitaxel next week. We again reviewed the possible toxicities, side effects and complications of this agent. She agrees to proceed. I am hoping she will be able to tolerate it well.  I am not sure why Daniah's left breast is swollen. I think imaging it with an MRI would be useful at this point and this is being set up.  She has refused to see my 11 assistant. I have explained to her that I do not have availability to see her for every treatment and that it is important that she be seen every treatment because of possible side effects. I have discussed this with my 77 assistant as well. If there is any issue, they will bring me again.  The issue of pain in Maidie's situation is complex. At present she feels her pain is uncontrolled. My chief interest is that she complete her chemotherapy. We need to develop a clear understanding regarding this, which we do not have at this point. Today I did write her a prescription for 30 Percocet tablets. I explained that these need to last her 2 weeks. If this becomes more of an issue we will have to again tried to refer her to a pain clinic     Chauncey Cruel, MD 10/02/2014

## 2014-10-02 NOTE — Telephone Encounter (Signed)
per pof to sch pt appt-gavept # to GI to sch MRI-gave pt copy of sch

## 2014-10-06 ENCOUNTER — Other Ambulatory Visit: Payer: Self-pay

## 2014-10-06 ENCOUNTER — Ambulatory Visit: Payer: Self-pay

## 2014-10-06 ENCOUNTER — Other Ambulatory Visit: Payer: Self-pay | Admitting: Oncology

## 2014-10-06 ENCOUNTER — Telehealth: Payer: Self-pay | Admitting: Oncology

## 2014-10-06 ENCOUNTER — Telehealth: Payer: Self-pay | Admitting: *Deleted

## 2014-10-06 DIAGNOSIS — C50412 Malignant neoplasm of upper-outer quadrant of left female breast: Secondary | ICD-10-CM

## 2014-10-06 DIAGNOSIS — C50912 Malignant neoplasm of unspecified site of left female breast: Secondary | ICD-10-CM

## 2014-10-06 DIAGNOSIS — C773 Secondary and unspecified malignant neoplasm of axilla and upper limb lymph nodes: Secondary | ICD-10-CM

## 2014-10-06 NOTE — Telephone Encounter (Signed)
This RN was notified by the treatment room charge nurse that pt was a no show for scheduled appointment today for 1st time taxol.  Per charge nurse treatment can be given this afternoon if needed.  This RN called pt to inquire per above- obtained unidentified VM- general message left to return call to this RN regarding appointment.

## 2014-10-07 ENCOUNTER — Telehealth: Payer: Self-pay | Admitting: *Deleted

## 2014-10-07 NOTE — Telephone Encounter (Signed)
Patient called to reschedule her missed appt from yesterday. I have forwarded the message to the desk RN. Patient aware

## 2014-10-08 ENCOUNTER — Other Ambulatory Visit: Payer: Self-pay | Admitting: *Deleted

## 2014-10-08 ENCOUNTER — Telehealth: Payer: Self-pay | Admitting: *Deleted

## 2014-10-08 NOTE — Telephone Encounter (Signed)
This RN received note from RN covering desk 12/8 stating pt needs to reschedule [place] 12/6.  Noted pt is scheduled for weekly taxol - 12/6 would have been cycle I.  This RN called pt and obtained VM.  Message left requesting a return call to this RN to discuss schedule,verify appointment times as well as transportation concerns.  Per MD - do not reschedule 12/6 chemo but start date will be 12/14.  This RN left a message on chemo room scheduling VM per above in case pt contacted her.

## 2014-10-10 ENCOUNTER — Telehealth: Payer: Self-pay | Admitting: Nurse Practitioner

## 2014-10-10 ENCOUNTER — Other Ambulatory Visit (HOSPITAL_BASED_OUTPATIENT_CLINIC_OR_DEPARTMENT_OTHER): Payer: Medicaid Other

## 2014-10-10 ENCOUNTER — Telehealth: Payer: Self-pay | Admitting: *Deleted

## 2014-10-10 ENCOUNTER — Ambulatory Visit (HOSPITAL_BASED_OUTPATIENT_CLINIC_OR_DEPARTMENT_OTHER): Payer: Medicaid Other | Admitting: Nurse Practitioner

## 2014-10-10 VITALS — BP 122/69 | HR 77 | Temp 98.6°F | Resp 18 | Ht 70.0 in | Wt 176.7 lb

## 2014-10-10 DIAGNOSIS — C50412 Malignant neoplasm of upper-outer quadrant of left female breast: Secondary | ICD-10-CM

## 2014-10-10 DIAGNOSIS — C773 Secondary and unspecified malignant neoplasm of axilla and upper limb lymph nodes: Secondary | ICD-10-CM

## 2014-10-10 DIAGNOSIS — C50812 Malignant neoplasm of overlapping sites of left female breast: Secondary | ICD-10-CM | POA: Diagnosis present

## 2014-10-10 DIAGNOSIS — C50912 Malignant neoplasm of unspecified site of left female breast: Secondary | ICD-10-CM

## 2014-10-10 DIAGNOSIS — J329 Chronic sinusitis, unspecified: Secondary | ICD-10-CM | POA: Diagnosis not present

## 2014-10-10 LAB — CBC WITH DIFFERENTIAL/PLATELET
BASO%: 1.8 % (ref 0.0–2.0)
Basophils Absolute: 0.2 10*3/uL — ABNORMAL HIGH (ref 0.0–0.1)
EOS%: 3.7 % (ref 0.0–7.0)
Eosinophils Absolute: 0.4 10*3/uL (ref 0.0–0.5)
HCT: 32.8 % — ABNORMAL LOW (ref 34.8–46.6)
HGB: 10.4 g/dL — ABNORMAL LOW (ref 11.6–15.9)
LYMPH%: 18.6 % (ref 14.0–49.7)
MCH: 27.8 pg (ref 25.1–34.0)
MCHC: 31.6 g/dL (ref 31.5–36.0)
MCV: 87.8 fL (ref 79.5–101.0)
MONO#: 0.9 10*3/uL (ref 0.1–0.9)
MONO%: 8.8 % (ref 0.0–14.0)
NEUT%: 67.1 % (ref 38.4–76.8)
NEUTROS ABS: 7.2 10*3/uL — AB (ref 1.5–6.5)
PLATELETS: 427 10*3/uL — AB (ref 145–400)
RBC: 3.74 10*6/uL (ref 3.70–5.45)
RDW: 19.3 % — ABNORMAL HIGH (ref 11.2–14.5)
WBC: 10.7 10*3/uL — ABNORMAL HIGH (ref 3.9–10.3)
lymph#: 2 10*3/uL (ref 0.9–3.3)

## 2014-10-10 MED ORDER — OXYCODONE-ACETAMINOPHEN 5-325 MG PO TABS: 1.0000 | ORAL_TABLET | Freq: Three times a day (TID) | ORAL | Status: DC | PRN

## 2014-10-10 MED ORDER — AMOXICILLIN 500 MG PO TABS: 500.0000 mg | ORAL_TABLET | Freq: Two times a day (BID) | ORAL | Status: DC

## 2014-10-10 NOTE — Telephone Encounter (Signed)
per pof to sch pt appt-sent HF email to adv no APP available for 12/28. will wait for reply to sch-pt has updated ssch of other appts

## 2014-10-10 NOTE — Telephone Encounter (Signed)
Called pt as a courtesy to remind her of appt today . No answer but left a detailed message for pt to call this nurse back @ [phone]. Message to be forwarded to [facility].

## 2014-10-10 NOTE — Progress Notes (Signed)
[place]  [phone] [phone]) [phone]     ID: Laura Matthews DOB: 46-Dec-1967  MR#: [id number]  RJJ#:[id number]  Patient Care Team: [name], MD as PCP - General (Family Medicine) Stark Klein, MD as Consulting Physician (General Surgery) Chauncey Cruel, MD as Consulting Physician (Oncology) Thea Silversmith, MD as Consulting Physician (Radiation Oncology) [facility] Bunnie Pion, NP as Nurse Practitioner (Nurse Practitioner)  CHIEF COMPLAINT: Stage III left breast cancer  CURRENT TREATMENT:adjuvant chemotherapy  BREAST CANCER HISTORY: From the original intake note:  The patient herself palpated a mass in her left breast and brought it to the attention of [facility] NP 04/22/2014. She palpated a large nontender mass at the 4:00 position as well as a small tender mass in the 6:00 position of the left breast. The patient was set up for imaging at the breast Center where on 05/05/2014 she underwent bilateral diagnostic mammography and left breast ultrasonography. The mammogram showed an irregular mass in the outer left breast with no other suspicious findings. This was firm and fixed at the 3:00 position approximately 10 cm from the nipple. Ultrasound showed a hypoechoic irregular solid mass in this area, measuring 1.8 cm. Ultrasound of the left axilla showed 2 suspicious level I lymph nodes, the largest measuring 1 cm, with a thickened pole. A smaller lymph node measured 0.6 cm but had no visible fatty hilum.  On 05/09/2014 the patient underwent biopsy of the left breast mass (she refused biopsy of the left axilla) which showed ([id number]) and invasive ductal carcinoma, grade 2, estrogen receptor positive at 100%, with strong staining intensity, progesterone receptor 80% positive, with weak staining intensity, with an MIB-1 of 80%, and no HER-2 amplification.  On 05/16/2014 the patient underwent bilateral breast MRI. This showed a 1.7 cm irregular enhancing  mass at the 3:00 position of the left breast associated with the biopsy cleft. There were 3 level I left axillary lymph nodes with thickened cortices. The largest measured 1.3 cm. There were no other findings of concern.  Her subsequent history is as detailed below.  INTERVAL HISTORY: Laura Matthews returns for follow up of her breast cancer. She was to begin her paclitaxel this week, but skipped the appointment to be with her daughter who went into labor that same day. She now has a grandson named, [name] and is very excited for her daughter.   REVIEW OF SYSTEMS:  Shiva denies fever or chills. Though she was not treated this week she had an episode of vomiting on Sunday of this week. She is moving her bowels well. Her appetite is "hit or miss." She denies mouth sores or rashes. She has been battling sinus symptoms for over a month now and now claims she is coughing up green phlegm. She has taken some generic mucinex but this "only helps in the moment." She continues to have left breast pain, for which she is taking oxycodone-APAP. She denies shortness of breath, chest pain, cough, or palpitations. Her energy level is low but manageable. She has moved into her own apartment and enjoys the space. A detailed review of systems is otherwise negative.   PAST MEDICAL HISTORY: Past Medical History  Diagnosis Date  . Mental disorder     depression-  . Arthritis   . Hot flashes   . Head ache   . Malignant neoplasm of breast (female), unspecified site   . Family history of malignant neoplasm of breast   . Depression     pt denies   .  Anemia     low iron    PAST SURGICAL HISTORY: Past Surgical History  Procedure Laterality Date  . Knee surgery  1995    right  . Foot surgery  2011    rt  . Tubal ligation    . Uterine fibroid surgery  2005    ablation  . Breast lumpectomy with sentinel lymph node biopsy Left 06/11/2014    Procedure: BREAST LUMPECTOMY WITH SENTINEL LYMPH NODE BX, POSSIBLE AXILLARY LYMPH  NODE DISSECTION;  Surgeon: Stark Klein, MD;  Location: [place];  Service: General;  Laterality: Left;  . Portacath placement N/A 07/02/2014    Procedure: INSERTION PORT-A-CATH;  Surgeon: Stark Klein, MD;  Location: [place];  Service: General;  Laterality: N/A;  . Incision and drainage Left 07/02/2014    Procedure: Drainage left breast seroma;  Surgeon: Stark Klein, MD;  Location: [place];  Service: General;  Laterality: Left;  . Evacuation breast hematoma Left 07/10/2014    Procedure: EVACUATION OF LEFT BREAST HEMATOMA ;  Surgeon: Stark Klein, MD;  Location[facility];  Service: General;  Laterality: Left;    FAMILY HISTORY Family History  Problem Relation Age of Onset  . Breast cancer Mother 21    currently 42; TAH/BSO d/t ?cervical ca at 44  . Breast cancer Maternal Aunt     dx 14s; currently in her late 31s  . Lung cancer Father     deceased 71   the patient's father died at the age of 44 from lung cancer. The patient's mother was diagnosed with breast cancer at the age of 93. She survives. The patient had 2 brothers, and 2 sisters. One brother may have a diagnosis of "stomach cancer"  GYNECOLOGIC HISTORY:  No LMP recorded. Patient has had an ablation. Menarche age 14 which is also when the patient first had a child. She is GX P3. She stopped having periods after laser ablation in 2005.  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 She has lived with her daughter [name] and her 4 children who are 56, 8, 5, and 4. 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 More recently the patient is living in a shelter. The patient's son Laura Matthews is a fast food cook in [place] and the patient's daughter Laura Matthews works as a Scientist, water quality, also in [place]. The patient has 7 grandchildren. She is not a church attender    ADVANCED DIRECTIVES: Not in place; this was discussed with the patient on her first visit,  46/22/2015, and she indicated she plans to name her daughter [name] as her healthcare power of attorney   HEALTH MAINTENANCE: History  Substance Use Topics  . Smoking status: Current Every Day Smoker -- 0.25 packs/day    Types: Cigarettes  . Smokeless tobacco: Never Used  . Alcohol Use: No     Comment: Percoset,Vicodin, Oxycotin     Colonoscopy:  PAP:  Bone density:  Lipid panel:  Allergies  Allergen Reactions  . Norco [Hydrocodone-Acetaminophen] Itching and Nausea Only    Pt reported allergy.    Current Outpatient Prescriptions  Medication Sig Dispense Refill  . LORazepam (ATIVAN) 0.5 MG tablet Take 1 tablet (0.5 mg total) by mouth every 6 (six) hours as needed (Nausea or vomiting). 30 tablet 0  . omeprazole (PRILOSEC) 40 MG capsule Take 1 capsule (40 mg total) by mouth at bedtime. 30 capsule 6  . ondansetron (ZOFRAN ODT) 8 MG disintegrating  tablet Take 1 tablet (8 mg total) by mouth every 8 (eight) hours as needed for nausea or vomiting. 20 tablet 0  . ranitidine (ZANTAC) 150 MG tablet Take 1 tablet (150 mg total) by mouth 2 (two) times daily. (Patient taking differently: Take 150 mg by mouth 2 (two) times daily as needed for heartburn. ) 60 tablet 0  . Alum & Mag Hydroxide-Simeth (MAGIC MOUTHWASH) SOLN Take 5 mLs by mouth 4 (four) times daily as needed for mouth pain.    Marland Kitchen dexamethasone (DECADRON) 4 MG tablet TAKE 2 TABLETS DAILY ON THE DAY AFTER CHEMO THEN 2 TABLETS 2 TIMES DAILY FOR 2 DAYS WITH FOOD (Patient not taking: Reported on 10/10/2014) 30 tablet 0  . fluconazole (DIFLUCAN) 100 MG tablet Take 1 tablet (100 mg total) by mouth daily. (Patient not taking: Reported on 09/17/2014) 15 tablet 2  . ibuprofen (ADVIL,MOTRIN) 200 MG tablet Take 1 tablet (200 mg total) by mouth every 6 (six) hours as needed for moderate pain. (Patient not taking: Reported on 09/17/2014) 30 tablet 0  . lidocaine-prilocaine (EMLA) cream Apply 1 application topically as needed. (Patient not taking:  Reported on 10/10/2014) 30 g 0  . nicotine (NICODERM CQ) 21 mg/24hr patch Place 1 patch (21 mg total) onto the skin daily. (Patient not taking: Reported on 09/17/2014) 28 patch 1  . omeprazole (PRILOSEC) 40 MG capsule Take 1 capsule (40 mg total) by mouth daily. (Patient not taking: Reported on 09/17/2014) 30 capsule 2  . Oxycodone HCl 20 MG TABS Take 1 tablet (20 mg total) by mouth every 4 (four) hours as needed (pain). (Patient not taking: Reported on 10/10/2014) 15 tablet 0  . oxyCODONE-acetaminophen (PERCOCET/ROXICET) 5-325 MG per tablet Take 1 tablet by mouth every 8 (eight) hours as needed for severe pain. 30 tablet 0   No current facility-administered medications for this visit.   Facility-Administered Medications Ordered in Other Visits  Medication Dose Route Frequency Provider Last Rate Last Dose  . sodium chloride 0.9 % injection 10 mL  10 mL Intracatheter PRN Chauncey Cruel, MD   10 mL at 07/29/14 1704    OBJECTIVE: Middle-aged [place] American woman who appears stated age 48 Vitals:   10/10/14 1350  BP: 122/69  Pulse: 77  Temp: 98.6 F (37 C)  Resp: 18     Body mass index is 25.35 kg/(m^2).    ECOG FS:1 - Symptomatic but completely ambulatory  Sclerae unicteric, pupils equal and reactive Oropharynx clear and moist-- no thrush No cervical or supraclavicular adenopathy Lungs no rales or rhonchi Heart regular rate and rhythm Abd soft, nontender, positive bowel sounds MSK no focal spinal tenderness, no upper extremity lymphedema Neuro: nonfocal, well oriented, appropriate affect Breasts: deferred  LAB RESULTS:  CMP     Component Value Date/Time   NA 142 10/02/2014 1307   NA 136* 09/17/2014 0700   K 3.7 10/02/2014 1307   K 4.0 09/17/2014 0700   CL 99 09/17/2014 0700   CO2 27 10/02/2014 1307   CO2 26 09/17/2014 0700   GLUCOSE 139 10/02/2014 1307   GLUCOSE 359* 09/17/2014 0700   BUN 6.1* 10/02/2014 1307   BUN 11 09/17/2014 0700   CREATININE 0.6 10/02/2014  1307   CREATININE 0.42* 09/17/2014 0700   CALCIUM 9.0 10/02/2014 1307   CALCIUM 9.2 09/17/2014 0700   PROT 6.5 10/02/2014 1307   PROT 6.5 09/17/2014 0700   ALBUMIN 3.2* 10/02/2014 1307   ALBUMIN 3.2* 09/17/2014 0700   AST 27 10/02/2014 1307  AST 11 09/17/2014 0700   ALT 37 10/02/2014 1307   ALT 20 09/17/2014 0700   ALKPHOS 105 10/02/2014 1307   ALKPHOS 131* 09/17/2014 0700   BILITOT <0.20 10/02/2014 1307   BILITOT <0.2* 09/17/2014 0700   GFRNONAA >90 09/17/2014 0700   GFRAA >90 09/17/2014 0700    I No results found for: SPEP  Lab Results  Component Value Date   WBC 10.7* 10/10/2014   NEUTROABS 7.2* 10/10/2014   HGB 10.4* 10/10/2014   HCT 32.8* 10/10/2014   MCV 87.8 10/10/2014   PLT 427* 10/10/2014      Chemistry      Component Value Date/Time   NA 142 10/02/2014 1307   NA 136* 09/17/2014 0700   K 3.7 10/02/2014 1307   K 4.0 09/17/2014 0700   CL 99 09/17/2014 0700   CO2 27 10/02/2014 1307   CO2 26 09/17/2014 0700   BUN 6.1* 10/02/2014 1307   BUN 11 09/17/2014 0700   CREATININE 0.6 10/02/2014 1307   CREATININE 0.42* 09/17/2014 0700      Component Value Date/Time   CALCIUM 9.0 10/02/2014 1307   CALCIUM 9.2 09/17/2014 0700   ALKPHOS 105 10/02/2014 1307   ALKPHOS 131* 09/17/2014 0700   AST 27 10/02/2014 1307   AST 11 09/17/2014 0700   ALT 37 10/02/2014 1307   ALT 20 09/17/2014 0700   BILITOT <0.20 10/02/2014 1307   BILITOT <0.2* 09/17/2014 0700       No results found for: LABCA2  No components found for: [id number]  No results for input(s): INR in the last 168 hours.  Urinalysis    Component Value Date/Time   COLORURINE YELLOW 06/03/2014 1050   APPEARANCEUR CLEAR 06/03/2014 1050   LABSPEC >1.030* 06/03/2014 1050   PHURINE 6.0 06/03/2014 1050   GLUCOSEU NEGATIVE 06/03/2014 1050   HGBUR SMALL* 06/03/2014 1050   BILIRUBINUR SMALL* 06/03/2014 1050   KETONESUR NEGATIVE 06/03/2014 1050   PROTEINUR NEGATIVE 06/03/2014 1050   UROBILINOGEN 0.2  06/03/2014 1050   NITRITE NEGATIVE 06/03/2014 1050   LEUKOCYTESUR TRACE* 06/03/2014 1050    STUDIES: Dg Chest 2 View  09/17/2014   CLINICAL DATA:  49 year old female productive cough for 1 month. Current history of stage II breast cancer undergoing chemotherapy. Initial encounter.  EXAM: CHEST  2 VIEW  COMPARISON:  07/02/2014 and earlier.  FINDINGS: Left chest porta cath is stable. Postoperative changes to the left axilla and chest wall re- identified. Lung volumes are within normal limits. No pneumothorax, pulmonary edema, pleural effusion or confluent pulmonary opacity. Normal cardiac size and mediastinal contours. Visualized tracheal air column is within normal limits. No pulmonary nodule identified. No acute osseous abnormality identified.  IMPRESSION: No acute or metastatic cardiopulmonary abnormality identified.   Electronically Signed   By: Lars Pinks M.D.   On: 09/17/2014 07:20     ASSESSMENT: 48 y.o. [place] woman status post left breast upper outer quadrant biopsy 05/09/2014 for a clinicalT1c N1, stage IIA invasive ductal carcinoma, grade 2, estrogen and progesterone receptor positive, HER-2 nonamplified, with an MIB-1 of 80%  (1) patient met with genetics counselor 05/29/2014 but decided against genetics testing   (2) tobacco abuse. The patient has been strongly urged to discontinue smoking. She was prescribed a nicotine patch 09/02/2014  (3) status post left lumpectomy and axillary lymph node dissection 06/11/2014 for a pT1c pN3, stage IIIC invasive ductal carcinoma, grade 3, with repeat HER-2 negative  (4) adjuvant chemotherapy to consist of doxorubicin and cyclophosphamide in dose dense fashion x4,  with Neulasta support, started 07/29/2014, to be followed by paclitaxel weekly x12  (5) adjuvant radiation to follow chemotherapy  (6) anti-estrogens for 5-10 years to follow radiation  PLAN: Tamre is doing well today. The labs were reviewed in detail and was entirely stable. She  will start cycle 1 of paclitaxel early on Monday morning. She requests her future appointments be made in the afternoon.   It is possible Javeria could be suffering from a bacterial sinus infection now that her phlegm has turned colors. I sent a prescription for amoxicillin 516m BID x 7 days to help clear this up. I also refilled her oxycodone-APAP during this visit.   KEvalyseseems to be in good spirits. She will return to this office on 12/21 for the start of cycle 2 of paclitaxel. She understands and agrees with this plan. She knows the goal of treatment in her case is cure. She has been encouraged to call with any issues that might arise before her next visit here.    FMarcelino Duster NP 10/10/2014

## 2014-10-10 NOTE — Telephone Encounter (Signed)
Error

## 2014-10-13 ENCOUNTER — Ambulatory Visit (HOSPITAL_BASED_OUTPATIENT_CLINIC_OR_DEPARTMENT_OTHER): Payer: Medicaid Other

## 2014-10-13 ENCOUNTER — Ambulatory Visit: Payer: Self-pay | Admitting: Oncology

## 2014-10-13 ENCOUNTER — Other Ambulatory Visit: Payer: Self-pay

## 2014-10-13 ENCOUNTER — Other Ambulatory Visit: Payer: Self-pay | Admitting: Oncology

## 2014-10-13 ENCOUNTER — Ambulatory Visit: Payer: Self-pay

## 2014-10-13 DIAGNOSIS — C50412 Malignant neoplasm of upper-outer quadrant of left female breast: Secondary | ICD-10-CM

## 2014-10-13 DIAGNOSIS — N644 Mastodynia: Secondary | ICD-10-CM

## 2014-10-13 DIAGNOSIS — C773 Secondary and unspecified malignant neoplasm of axilla and upper limb lymph nodes: Secondary | ICD-10-CM

## 2014-10-13 DIAGNOSIS — C50911 Malignant neoplasm of unspecified site of right female breast: Secondary | ICD-10-CM

## 2014-10-13 DIAGNOSIS — Z5111 Encounter for antineoplastic chemotherapy: Secondary | ICD-10-CM

## 2014-10-13 DIAGNOSIS — C50812 Malignant neoplasm of overlapping sites of left female breast: Secondary | ICD-10-CM

## 2014-10-13 DIAGNOSIS — N63 Unspecified lump in unspecified breast: Secondary | ICD-10-CM

## 2014-10-13 MED ORDER — ONDANSETRON 8 MG/NS 50 ML IVPB
INTRAVENOUS | Status: AC
  Filled 2014-10-13: qty 8

## 2014-10-13 MED ORDER — DIPHENHYDRAMINE HCL 50 MG/ML IJ SOLN
25.0000 mg | Freq: Once | INTRAMUSCULAR | Status: AC
  Administered 2014-10-13: 25 mg via INTRAVENOUS

## 2014-10-13 MED ORDER — FAMOTIDINE IN NACL 20-0.9 MG/50ML-% IV SOLN
INTRAVENOUS | Status: AC
  Filled 2014-10-13: qty 50

## 2014-10-13 MED ORDER — FAMOTIDINE IN NACL 20-0.9 MG/50ML-% IV SOLN
20.0000 mg | Freq: Once | INTRAVENOUS | Status: AC
  Administered 2014-10-13: 20 mg via INTRAVENOUS

## 2014-10-13 MED ORDER — DEXTROSE 5 % IV SOLN
80.0000 mg/m2 | Freq: Once | INTRAVENOUS | Status: AC
  Administered 2014-10-13: 162 mg via INTRAVENOUS
  Filled 2014-10-13: qty 27

## 2014-10-13 MED ORDER — DEXAMETHASONE SODIUM PHOSPHATE 10 MG/ML IJ SOLN
INTRAMUSCULAR | Status: AC
  Filled 2014-10-13: qty 1

## 2014-10-13 MED ORDER — SODIUM CHLORIDE 0.9 % IJ SOLN
10.0000 mL | INTRAMUSCULAR | Status: DC | PRN
  Administered 2014-10-13: 10 mL
  Filled 2014-10-13: qty 10

## 2014-10-13 MED ORDER — HEPARIN SOD (PORK) LOCK FLUSH 100 UNIT/ML IV SOLN
500.0000 [IU] | Freq: Once | INTRAVENOUS | Status: AC | PRN
  Administered 2014-10-13: 500 [IU]
  Filled 2014-10-13: qty 5

## 2014-10-13 MED ORDER — DEXAMETHASONE SODIUM PHOSPHATE 10 MG/ML IJ SOLN
4.0000 mg | Freq: Once | INTRAMUSCULAR | Status: AC
  Administered 2014-10-13: 4 mg via INTRAVENOUS

## 2014-10-13 MED ORDER — ONDANSETRON 8 MG/50ML IVPB (CHCC)
8.0000 mg | Freq: Once | INTRAVENOUS | Status: AC
  Administered 2014-10-13: 8 mg via INTRAVENOUS

## 2014-10-13 MED ORDER — SODIUM CHLORIDE 0.9 % IV SOLN
Freq: Once | INTRAVENOUS | Status: AC
  Administered 2014-10-13: 09:00:00 via INTRAVENOUS

## 2014-10-13 MED ORDER — DIPHENHYDRAMINE HCL 50 MG/ML IJ SOLN
INTRAMUSCULAR | Status: AC
  Filled 2014-10-13: qty 1

## 2014-10-13 NOTE — Patient Instructions (Addendum)
[place] Discharge Instructions for Patients Receiving Chemotherapy  Today you received the following chemotherapy agents: Taxol  To help prevent nausea and vomiting after your treatment, we encourage you to take your nausea medication: Zofran 8 mg every 8 hrs as needed for nausea.   If you develop nausea and vomiting that is not controlled by your nausea medication, call the clinic.   BELOW ARE SYMPTOMS THAT SHOULD BE REPORTED IMMEDIATELY:  *FEVER GREATER THAN 100.5 F  *CHILLS WITH OR WITHOUT FEVER  NAUSEA AND VOMITING THAT IS NOT CONTROLLED WITH YOUR NAUSEA MEDICATION  *UNUSUAL SHORTNESS OF BREATH  *UNUSUAL BRUISING OR BLEEDING  TENDERNESS IN MOUTH AND THROAT WITH OR WITHOUT PRESENCE OF ULCERS  *URINARY PROBLEMS  *BOWEL PROBLEMS  UNUSUAL RASH Items with * indicate a potential emergency and should be followed up as soon as possible.  Feel free to call the clinic you have any questions or concerns. The clinic phone number is [phone].   Paclitaxel injection (taxol) What is this medicine? PACLITAXEL (PAK li TAX el) is a chemotherapy drug. It targets fast dividing cells, like cancer cells, and causes these cells to die. This medicine is used to treat ovarian cancer, breast cancer, and other cancers. This medicine may be used for other purposes; ask your health care provider or pharmacist if you have questions. COMMON BRAND NAME(S): Onxol, Taxol What should I tell my health care provider before I take this medicine? They need to know if you have any of these conditions: -blood disorders -irregular heartbeat -infection (especially a virus infection such as chickenpox, cold sores, or herpes) -liver disease -previous or ongoing radiation therapy -an unusual or allergic reaction to paclitaxel, alcohol, polyoxyethylated castor oil, other chemotherapy agents, other medicines, foods, dyes, or preservatives -pregnant or trying to get  pregnant -breast-feeding How should I use this medicine? This drug is given as an infusion into a vein. It is administered in a hospital or clinic by a specially trained health care professional. Talk to your pediatrician regarding the use of this medicine in children. Special care may be needed. Overdosage: If you think you have taken too much of this medicine contact a poison control center or emergency room at once. NOTE: This medicine is only for you. Do not share this medicine with others. What if I miss a dose? It is important not to miss your dose. Call your doctor or health care professional if you are unable to keep an appointment. What may interact with this medicine? Do not take this medicine with any of the following medications: -disulfiram -metronidazole This medicine may also interact with the following medications: -cyclosporine -diazepam -ketoconazole -medicines to increase blood counts like filgrastim, pegfilgrastim, sargramostim -other chemotherapy drugs like cisplatin, doxorubicin, epirubicin, etoposide, teniposide, vincristine -quinidine -testosterone -vaccines -verapamil Talk to your doctor or health care professional before taking any of these medicines: -acetaminophen -aspirin -ibuprofen -ketoprofen -naproxen This list may not describe all possible interactions. Give your health care provider a list of all the medicines, herbs, non-prescription drugs, or dietary supplements you use. Also tell them if you smoke, drink alcohol, or use illegal drugs. Some items may interact with your medicine. What should I watch for while using this medicine? Your condition will be monitored carefully while you are receiving this medicine. You will need important blood work done while you are taking this medicine. This drug may make you feel generally unwell. This is not uncommon, as chemotherapy can affect healthy cells as well as cancer cells.  Report any side effects. Continue  your course of treatment even though you feel ill unless your doctor tells you to stop. In some cases, you may be given additional medicines to help with side effects. Follow all directions for their use. Call your doctor or health care professional for advice if you get a fever, chills or sore throat, or other symptoms of a cold or flu. Do not treat yourself. This drug decreases your body's ability to fight infections. Try to avoid being around people who are sick. This medicine may increase your risk to bruise or bleed. Call your doctor or health care professional if you notice any unusual bleeding. Be careful brushing and flossing your teeth or using a toothpick because you may get an infection or bleed more easily. If you have any dental work done, tell your dentist you are receiving this medicine. Avoid taking products that contain aspirin, acetaminophen, ibuprofen, naproxen, or ketoprofen unless instructed by your doctor. These medicines may hide a fever. Do not become pregnant while taking this medicine. Women should inform their doctor if they wish to become pregnant or think they might be pregnant. There is a potential for serious side effects to an unborn child. Talk to your health care professional or pharmacist for more information. Do not breast-feed an infant while taking this medicine. Men are advised not to father a child while receiving this medicine. What side effects may I notice from receiving this medicine? Side effects that you should report to your doctor or health care professional as soon as possible: -allergic reactions like skin rash, itching or hives, swelling of the face, lips, or tongue -low blood counts - This drug may decrease the number of white blood cells, red blood cells and platelets. You may be at increased risk for infections and bleeding. -signs of infection - fever or chills, cough, sore throat, pain or difficulty passing urine -signs of decreased platelets or  bleeding - bruising, pinpoint red spots on the skin, black, tarry stools, nosebleeds -signs of decreased red blood cells - unusually weak or tired, fainting spells, lightheadedness -breathing problems -chest pain -high or low blood pressure -mouth sores -nausea and vomiting -pain, swelling, redness or irritation at the injection site -pain, tingling, numbness in the hands or feet -slow or irregular heartbeat -swelling of the ankle, feet, hands Side effects that usually do not require medical attention (report to your doctor or health care professional if they continue or are bothersome): -bone pain -complete hair loss including hair on your head, underarms, pubic hair, eyebrows, and eyelashes -changes in the color of fingernails -diarrhea -loosening of the fingernails -loss of appetite -muscle or joint pain -red flush to skin -sweating This list may not describe all possible side effects. Call your doctor for medical advice about side effects. You may report side effects to FDA at 1-800-FDA-1088. Where should I keep my medicine? This drug is given in a hospital or clinic and will not be stored at home. NOTE: This sheet is a summary. It may not cover all possible information. If you have questions about this medicine, talk to your doctor, pharmacist, or health care provider.  2015, Elsevier/Gold Standard. (2012-12-10 16:41:21)

## 2014-10-14 ENCOUNTER — Telehealth: Payer: Self-pay | Admitting: Nurse Practitioner

## 2014-10-14 ENCOUNTER — Telehealth: Payer: Self-pay | Admitting: *Deleted

## 2014-10-14 NOTE — Telephone Encounter (Signed)
Called Laura Matthews for chemotherapy F/U.  Patient is doing well.  Denies n/v.  Denies any new side effects or symptoms.  Bowel and bladder is functioning well.  Eating and drinking well and I instructed to drink 64 oz minimum daily or at least the day before, of and after treatment.  Denies questions at this time and encouraged to call if needed.  Reviewed how to call after hours in the case of an emergency.

## 2014-10-14 NOTE — Telephone Encounter (Signed)
mailed pt complte sch

## 2014-10-14 NOTE — Telephone Encounter (Signed)
-----   Message from Cora Collum, RN sent at 10/13/2014  9:00 AM EST ----- Regarding: Chemo follow up call Contact: [phone] Changed tx to Taxol, first infusion today Dr. [name]

## 2014-10-20 ENCOUNTER — Ambulatory Visit (HOSPITAL_BASED_OUTPATIENT_CLINIC_OR_DEPARTMENT_OTHER): Payer: Medicaid Other | Admitting: Oncology

## 2014-10-20 ENCOUNTER — Ambulatory Visit (HOSPITAL_BASED_OUTPATIENT_CLINIC_OR_DEPARTMENT_OTHER): Payer: Medicaid Other

## 2014-10-20 ENCOUNTER — Encounter: Payer: Self-pay | Admitting: Oncology

## 2014-10-20 ENCOUNTER — Other Ambulatory Visit: Payer: Self-pay

## 2014-10-20 VITALS — BP 166/78 | HR 104 | Temp 99.5°F | Resp 18 | Ht 70.0 in | Wt 180.5 lb

## 2014-10-20 DIAGNOSIS — Z5111 Encounter for antineoplastic chemotherapy: Secondary | ICD-10-CM

## 2014-10-20 DIAGNOSIS — C50412 Malignant neoplasm of upper-outer quadrant of left female breast: Secondary | ICD-10-CM

## 2014-10-20 DIAGNOSIS — C50911 Malignant neoplasm of unspecified site of right female breast: Secondary | ICD-10-CM

## 2014-10-20 DIAGNOSIS — G893 Neoplasm related pain (acute) (chronic): Secondary | ICD-10-CM

## 2014-10-20 DIAGNOSIS — L539 Erythematous condition, unspecified: Secondary | ICD-10-CM

## 2014-10-20 DIAGNOSIS — C50912 Malignant neoplasm of unspecified site of left female breast: Secondary | ICD-10-CM

## 2014-10-20 DIAGNOSIS — C773 Secondary and unspecified malignant neoplasm of axilla and upper limb lymph nodes: Secondary | ICD-10-CM

## 2014-10-20 LAB — CBC WITH DIFFERENTIAL/PLATELET
BASO%: 1 % (ref 0.0–2.0)
Basophils Absolute: 0.1 10*3/uL (ref 0.0–0.1)
EOS%: 3.6 % (ref 0.0–7.0)
Eosinophils Absolute: 0.3 10*3/uL (ref 0.0–0.5)
HEMATOCRIT: 32.4 % — AB (ref 34.8–46.6)
HGB: 10.2 g/dL — ABNORMAL LOW (ref 11.6–15.9)
LYMPH%: 12.8 % — AB (ref 14.0–49.7)
MCH: 27.2 pg (ref 25.1–34.0)
MCHC: 31.5 g/dL (ref 31.5–36.0)
MCV: 86.5 fL (ref 79.5–101.0)
MONO#: 0.5 10*3/uL (ref 0.1–0.9)
MONO%: 5.9 % (ref 0.0–14.0)
NEUT#: 6.7 10*3/uL — ABNORMAL HIGH (ref 1.5–6.5)
NEUT%: 76.7 % (ref 38.4–76.8)
Platelets: 300 10*3/uL (ref 145–400)
RBC: 3.75 10*6/uL (ref 3.70–5.45)
RDW: 19 % — ABNORMAL HIGH (ref 11.2–14.5)
WBC: 8.8 10*3/uL (ref 3.9–10.3)
lymph#: 1.1 10*3/uL (ref 0.9–3.3)

## 2014-10-20 LAB — COMPREHENSIVE METABOLIC PANEL (CC13)
ALK PHOS: 72 U/L (ref 40–150)
ALT: 27 U/L (ref 0–55)
ANION GAP: 10 meq/L (ref 3–11)
AST: 20 U/L (ref 5–34)
Albumin: 3.3 g/dL — ABNORMAL LOW (ref 3.5–5.0)
BILIRUBIN TOTAL: 0.31 mg/dL (ref 0.20–1.20)
BUN: 7.6 mg/dL (ref 7.0–26.0)
CO2: 31 meq/L — AB (ref 22–29)
Calcium: 9.4 mg/dL (ref 8.4–10.4)
Chloride: 100 mEq/L (ref 98–109)
Creatinine: 0.6 mg/dL (ref 0.6–1.1)
EGFR: >90 mL/min/{1.73_m2} (ref >90)
Glucose: 184 mg/dl — ABNORMAL HIGH (ref 70–140)
Potassium: 3.7 mEq/L (ref 3.5–5.1)
Sodium: 141 mEq/L (ref 136–145)
Total Protein: 6.6 g/dL (ref 6.4–8.3)

## 2014-10-20 MED ORDER — PACLITAXEL CHEMO INJECTION 300 MG/50ML
80.0000 mg/m2 | Freq: Once | INTRAVENOUS | Status: AC
  Administered 2014-10-20: 162 mg via INTRAVENOUS
  Filled 2014-10-20: qty 27

## 2014-10-20 MED ORDER — SODIUM CHLORIDE 0.9 % IJ SOLN
10.0000 mL | INTRAMUSCULAR | Status: DC | PRN
  Administered 2014-10-20: 10 mL
  Filled 2014-10-20: qty 10

## 2014-10-20 MED ORDER — SODIUM CHLORIDE 0.9 % IV SOLN
Freq: Once | INTRAVENOUS | Status: AC
  Administered 2014-10-20: 14:00:00 via INTRAVENOUS

## 2014-10-20 MED ORDER — HEPARIN SOD (PORK) LOCK FLUSH 100 UNIT/ML IV SOLN
500.0000 [IU] | Freq: Once | INTRAVENOUS | Status: AC | PRN
  Administered 2014-10-20: 500 [IU]
  Filled 2014-10-20: qty 5

## 2014-10-20 MED ORDER — OXYCODONE-ACETAMINOPHEN 5-325 MG PO TABS: 1.0000 | ORAL_TABLET | Freq: Three times a day (TID) | ORAL | Status: DC | PRN

## 2014-10-20 MED ORDER — ONDANSETRON 8 MG/NS 50 ML IVPB
INTRAVENOUS | Status: AC
  Filled 2014-10-20: qty 8

## 2014-10-20 MED ORDER — DIPHENHYDRAMINE HCL 50 MG/ML IJ SOLN
INTRAMUSCULAR | Status: AC
  Filled 2014-10-20: qty 1

## 2014-10-20 MED ORDER — DEXAMETHASONE SODIUM PHOSPHATE 10 MG/ML IJ SOLN
4.0000 mg | Freq: Once | INTRAMUSCULAR | Status: AC
  Administered 2014-10-20: 4 mg via INTRAVENOUS

## 2014-10-20 MED ORDER — DEXAMETHASONE SODIUM PHOSPHATE 10 MG/ML IJ SOLN
INTRAMUSCULAR | Status: AC
  Filled 2014-10-20: qty 1

## 2014-10-20 MED ORDER — CEPHALEXIN 500 MG PO CAPS: 500.0000 mg | ORAL_CAPSULE | Freq: Four times a day (QID) | ORAL | Status: DC

## 2014-10-20 MED ORDER — ONDANSETRON 8 MG/50ML IVPB (CHCC)
8.0000 mg | Freq: Once | INTRAVENOUS | Status: AC
  Administered 2014-10-20: 8 mg via INTRAVENOUS

## 2014-10-20 MED ORDER — DIPHENHYDRAMINE HCL 50 MG/ML IJ SOLN
25.0000 mg | Freq: Once | INTRAMUSCULAR | Status: AC
  Administered 2014-10-20: 25 mg via INTRAVENOUS

## 2014-10-20 MED ORDER — FAMOTIDINE IN NACL 20-0.9 MG/50ML-% IV SOLN
20.0000 mg | Freq: Once | INTRAVENOUS | Status: AC
  Administered 2014-10-20: 20 mg via INTRAVENOUS

## 2014-10-20 MED ORDER — FAMOTIDINE IN NACL 20-0.9 MG/50ML-% IV SOLN
INTRAVENOUS | Status: AC
  Filled 2014-10-20: qty 50

## 2014-10-20 NOTE — Progress Notes (Signed)
[place]  [phone] [phone]) [phone]     ID: Laura Matthews DOB: [birth year]  MR#: [id number]  HER#:[id number]  Patient Care Team: [name], MD as PCP - General (Family Medicine) Stark Klein, MD as Consulting Physician (General Surgery) Chauncey Cruel, MD as Consulting Physician (Oncology) Thea Silversmith, MD as Consulting Physician (Radiation Oncology) [facility] Bunnie Pion, NP as Nurse Practitioner (Nurse Practitioner)  CHIEF COMPLAINT: Stage III left breast cancer  CURRENT TREATMENT:adjuvant chemotherapy  BREAST CANCER HISTORY: From the original intake note:  The patient herself palpated a mass in her left breast and brought it to the attention of [facility] NP 04/22/2014. She palpated a large nontender mass at the 4:00 position as well as a small tender mass in the 6:00 position of the left breast. The patient was set up for imaging at the breast Center where on 05/05/2014 she underwent bilateral diagnostic mammography and left breast ultrasonography. The mammogram showed an irregular mass in the outer left breast with no other suspicious findings. This was firm and fixed at the 3:00 position approximately 10 cm from the nipple. Ultrasound showed a hypoechoic irregular solid mass in this area, measuring 1.8 cm. Ultrasound of the left axilla showed 2 suspicious level I lymph nodes, the largest measuring 1 cm, with a thickened pole. A smaller lymph node measured 0.6 cm but had no visible fatty hilum.  On 05/09/2014 the patient underwent biopsy of the left breast mass (she refused biopsy of the left axilla) which showed ([id number]) and invasive ductal carcinoma, grade 2, estrogen receptor positive at 100%, with strong staining intensity, progesterone receptor 80% positive, with weak staining intensity, with an MIB-1 of 80%, and no HER-2 amplification.  On 05/16/2014 the patient underwent bilateral breast MRI. This showed a 1.7 cm irregular enhancing  mass at the 3:00 position of the left breast associated with the biopsy cleft. There were 3 level I left axillary lymph nodes with thickened cortices. The largest measured 1.3 cm. There were no other findings of concern.  Her subsequent history is as detailed below.  INTERVAL HISTORY: [name] returns for follow up of her breast cancer. Currently receiving weekly Taxol.  REVIEW OF SYSTEMS:  Saranne denies fever or chills. Has intermittent nausea. Not vomiting. Taking her antiemetics which are effective. She continues to have left breast pain, for which she is taking oxycodone-APAP. Reports that breast feels swollen and hard. Due for a follow-up mammogram next week. She denies shortness of breath, chest pain, cough, or palpitations. Her energy level is low but manageable. A detailed review of systems is otherwise negative.   PAST MEDICAL HISTORY: Past Medical History  Diagnosis Date  . Mental disorder     depression-  . Arthritis   . Hot flashes   . Head ache   . Malignant neoplasm of breast (female), unspecified site   . Family history of malignant neoplasm of breast   . Depression     pt denies   . Anemia     low iron    PAST SURGICAL HISTORY: Past Surgical History  Procedure Laterality Date  . Knee surgery  1995    right  . Foot surgery  2011    rt  . Tubal ligation    . Uterine fibroid surgery  2005    ablation  . Breast lumpectomy with sentinel lymph node biopsy Left 06/11/2014    Procedure: BREAST LUMPECTOMY WITH SENTINEL LYMPH NODE BX, POSSIBLE AXILLARY LYMPH NODE DISSECTION;  Surgeon: Stark Klein,  MD;  Location: [place];  Service: General;  Laterality: Left;  . Portacath placement N/A 07/02/2014    Procedure: INSERTION PORT-A-CATH;  Surgeon: Stark Klein, MD;  Location: [place];  Service: General;  Laterality: N/A;  . Incision and drainage Left 07/02/2014    Procedure: Drainage left breast seroma;  Surgeon: Stark Klein, MD;  Location: [place];  Service: General;  Laterality: Left;  . Evacuation breast hematoma Left 07/10/2014    Procedure: EVACUATION OF LEFT BREAST HEMATOMA ;  Surgeon: Stark Klein, MD;  Location: [facility];  Service: General;  Laterality: Left;    FAMILY HISTORY Family History  Problem Relation Age of Onset  . Breast cancer Mother 40    currently 33; TAH/BSO d/t ?cervical ca at 18  . Breast cancer Maternal Aunt     dx 60s; currently in her late 43s  . Lung cancer Father     deceased 69   the patient's father died at the age of 85 from lung cancer. The patient's mother was diagnosed with breast cancer at the age of 88. She survives. The patient had 2 brothers, and 2 sisters. One brother may have a diagnosis of "stomach cancer"  GYNECOLOGIC HISTORY:  No LMP recorded. Patient has had an ablation. Menarche age 33 which is also when the patient first had a child. She is GX P3. She stopped having periods after laser ablation in 2005.  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 She has lived with her daughter [name] and her 4 children who are 32, 8, 5, and 4. 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 More recently the patient is living in a shelter. The patient's son [name] is a fast food cook in [place] and the patient's daughter [name] works as a Scientist, water quality, also in [place]. The patient has 7 grandchildren. She is not a church attender    ADVANCED DIRECTIVES: Not in place; this was discussed with the patient on her first visit, 05/21/2014, and she indicated she plans to name her daughter [name] as her healthcare power of attorney   HEALTH MAINTENANCE: History  Substance Use Topics  . Smoking status: Current Every Day Smoker -- 0.25 packs/day    Types: Cigarettes  . Smokeless tobacco: Never Used  . Alcohol Use: No     Comment: Percoset,Vicodin, Oxycotin     Colonoscopy:  PAP:  Bone density:  Lipid panel:  Allergies  Allergen  Reactions  . Norco [Hydrocodone-Acetaminophen] Itching and Nausea Only    Pt reported allergy.    Current Outpatient Prescriptions  Medication Sig Dispense Refill  . Alum & Mag Hydroxide-Simeth (MAGIC MOUTHWASH) SOLN Take 5 mLs by mouth 4 (four) times daily as needed for mouth pain.    Marland Kitchen amoxicillin (AMOXIL) 500 MG tablet Take 1 tablet (500 mg total) by mouth 2 (two) times daily. 14 tablet 0  . dexamethasone (DECADRON) 4 MG tablet TAKE 2 TABLETS DAILY ON THE DAY AFTER CHEMO THEN 2 TABLETS 2 TIMES DAILY FOR 2 DAYS WITH FOOD 30 tablet 0  . lidocaine-prilocaine (EMLA) cream Apply 1 application topically as needed. 30 g 0  . LORazepam (ATIVAN) 0.5 MG tablet Take 1 tablet (0.5 mg total) by mouth every 6 (six) hours as needed (Nausea or vomiting). 30 tablet 0  . omeprazole (PRILOSEC) 40 MG capsule Take 1 capsule (40 mg total) by mouth at bedtime. 30 capsule 6  . ondansetron (ZOFRAN ODT)  8 MG disintegrating tablet Take 1 tablet (8 mg total) by mouth every 8 (eight) hours as needed for nausea or vomiting. 20 tablet 0  . oxyCODONE-acetaminophen (PERCOCET/ROXICET) 5-325 MG per tablet Take 1 tablet by mouth every 8 (eight) hours as needed for severe pain. 30 tablet 0  . ranitidine (ZANTAC) 150 MG tablet Take 1 tablet (150 mg total) by mouth 2 (two) times daily. (Patient taking differently: Take 150 mg by mouth 2 (two) times daily as needed for heartburn. ) 60 tablet 0  . cephALEXin (KEFLEX) 500 MG capsule Take 1 capsule (500 mg total) by mouth 4 (four) times daily. 40 capsule 0  . fluconazole (DIFLUCAN) 100 MG tablet Take 1 tablet (100 mg total) by mouth daily. (Patient not taking: Reported on 10/20/2014) 15 tablet 2  . ibuprofen (ADVIL,MOTRIN) 200 MG tablet Take 1 tablet (200 mg total) by mouth every 6 (six) hours as needed for moderate pain. (Patient not taking: Reported on 09/17/2014) 30 tablet 0  . nicotine (NICODERM CQ) 21 mg/24hr patch Place 1 patch (21 mg total) onto the skin daily. (Patient not  taking: Reported on 09/17/2014) 28 patch 1  . omeprazole (PRILOSEC) 40 MG capsule Take 1 capsule (40 mg total) by mouth daily. (Patient not taking: Reported on 09/17/2014) 30 capsule 2  . Oxycodone HCl 20 MG TABS Take 1 tablet (20 mg total) by mouth every 4 (four) hours as needed (pain). (Patient not taking: Reported on 10/10/2014) 15 tablet 0   No current facility-administered medications for this visit.   Facility-Administered Medications Ordered in Other Visits  Medication Dose Route Frequency Provider Last Rate Last Dose  . heparin lock flush 100 unit/mL  500 Units Intracatheter Once PRN Chauncey Cruel, MD      . sodium chloride 0.9 % injection 10 mL  10 mL Intracatheter PRN Chauncey Cruel, MD   10 mL at 07/29/14 1704  . sodium chloride 0.9 % injection 10 mL  10 mL Intracatheter PRN Chauncey Cruel, MD        OBJECTIVE: Middle-aged [place] American woman who appears stated age 48 Vitals:   10/20/14 1120  BP: 166/78  Pulse: 104  Temp: 99.5 F (37.5 C)  Resp: 18     Body mass index is 25.9 kg/(m^2).    ECOG FS:1 - Symptomatic but completely ambulatory  Sclerae unicteric, pupils equal and reactive Oropharynx clear and moist-- no thrush No cervical or supraclavicular adenopathy Lungs no rales or rhonchi Heart regular rate and rhythm Abd soft, nontender, positive bowel sounds MSK no focal spinal tenderness, no upper extremity lymphedema Neuro: nonfocal, well oriented, appropriate affect Breasts: Left breast is swollen and under lumpectomy site. Tender to touch. Mild erythema, but no drainage.  LAB RESULTS:  CMP     Component Value Date/Time   NA 141 10/20/2014 1102   NA 136* 09/17/2014 0700   K 3.7 10/20/2014 1102   K 4.0 09/17/2014 0700   CL 99 09/17/2014 0700   CO2 31* 10/20/2014 1102   CO2 26 09/17/2014 0700   GLUCOSE 184* 10/20/2014 1102   GLUCOSE 359* 09/17/2014 0700   BUN 7.6 10/20/2014 1102   BUN 11 09/17/2014 0700   CREATININE 0.6 10/20/2014 1102    CREATININE 0.42* 09/17/2014 0700   CALCIUM 9.4 10/20/2014 1102   CALCIUM 9.2 09/17/2014 0700   PROT 6.6 10/20/2014 1102   PROT 6.5 09/17/2014 0700   ALBUMIN 3.3* 10/20/2014 1102   ALBUMIN 3.2* 09/17/2014 0700   AST 20 10/20/2014 1102  AST 11 09/17/2014 0700   ALT 27 10/20/2014 1102   ALT 20 09/17/2014 0700   ALKPHOS 72 10/20/2014 1102   ALKPHOS 131* 09/17/2014 0700   BILITOT 0.31 10/20/2014 1102   BILITOT <0.2* 09/17/2014 0700   GFRNONAA >90 09/17/2014 0700   GFRAA >90 09/17/2014 0700    I No results found for: SPEP  Lab Results  Component Value Date   WBC 8.8 10/20/2014   NEUTROABS 6.7* 10/20/2014   HGB 10.2* 10/20/2014   HCT 32.4* 10/20/2014   MCV 86.5 10/20/2014   PLT 300 10/20/2014      Chemistry      Component Value Date/Time   NA 141 10/20/2014 1102   NA 136* 09/17/2014 0700   K 3.7 10/20/2014 1102   K 4.0 09/17/2014 0700   CL 99 09/17/2014 0700   CO2 31* 10/20/2014 1102   CO2 26 09/17/2014 0700   BUN 7.6 10/20/2014 1102   BUN 11 09/17/2014 0700   CREATININE 0.6 10/20/2014 1102   CREATININE 0.42* 09/17/2014 0700      Component Value Date/Time   CALCIUM 9.4 10/20/2014 1102   CALCIUM 9.2 09/17/2014 0700   ALKPHOS 72 10/20/2014 1102   ALKPHOS 131* 09/17/2014 0700   AST 20 10/20/2014 1102   AST 11 09/17/2014 0700   ALT 27 10/20/2014 1102   ALT 20 09/17/2014 0700   BILITOT 0.31 10/20/2014 1102   BILITOT <0.2* 09/17/2014 0700       No results found for: LABCA2  No components found for: ZYYQM250  No results for input(s): INR in the last 168 hours.  Urinalysis    Component Value Date/Time   COLORURINE YELLOW 06/03/2014 1050   APPEARANCEUR CLEAR 06/03/2014 1050   LABSPEC >1.030* 06/03/2014 1050   PHURINE 6.0 06/03/2014 1050   GLUCOSEU NEGATIVE 06/03/2014 1050   HGBUR SMALL* 06/03/2014 1050   BILIRUBINUR SMALL* 06/03/2014 1050   KETONESUR NEGATIVE 06/03/2014 1050   PROTEINUR NEGATIVE 06/03/2014 1050   UROBILINOGEN 0.2 06/03/2014 1050    NITRITE NEGATIVE 06/03/2014 1050   LEUKOCYTESUR TRACE* 06/03/2014 1050    STUDIES: No results found.   ASSESSMENT: 48 y.o. [place] woman status post left breast upper outer quadrant biopsy 05/09/2014 for a clinicalT1c N1, stage IIA invasive ductal carcinoma, grade 2, estrogen and progesterone receptor positive, HER-2 nonamplified, with an MIB-1 of 80%  (1) patient met with genetics counselor 05/29/2014 but decided against genetics testing   (2) tobacco abuse. The patient has been strongly urged to discontinue smoking. She was prescribed a nicotine patch 09/02/2014  (3) status post left lumpectomy and axillary lymph node dissection 06/11/2014 for a pT1c pN3, stage IIIC invasive ductal carcinoma, grade 3, with repeat HER-2 negative  (4) adjuvant chemotherapy to consist of doxorubicin and cyclophosphamide in dose dense fashion x4, with Neulasta support, started 07/29/2014, to be followed by paclitaxel weekly x12  (5) adjuvant radiation to follow chemotherapy  (6) anti-estrogens for 5-10 years to follow radiation  PLAN: Massie is doing well today. The labs were reviewed in detail and was entirely stable. She will proceed with cycle 2 o her Taxol today.   I have refilled her Percocet today for her left breast pain. I have also started her on Keflex 500 mg QID as her left breast has some erythema. However, I suspect that she has a seroma that may need to be drained. She will have the mammogram next week to look more closely at this area.   Jaydn seems to be in good spirits. She  will return weekly for her paclitaxel. She understands and agrees with this plan. She knows the goal of treatment in her case is cure. She has been encouraged to call with any issues that might arise before her next visit here.    Mikey Bussing, NP 10/20/2014

## 2014-10-20 NOTE — Patient Instructions (Signed)
Paclitaxel injection What is this medicine? PACLITAXEL (PAK li TAX el) is a chemotherapy drug. It targets fast dividing cells, like cancer cells, and causes these cells to die. This medicine is used to treat ovarian cancer, breast cancer, and other cancers. This medicine may be used for other purposes; ask your health care provider or pharmacist if you have questions. COMMON BRAND NAME(S): Onxol, Taxol What should I tell my health care provider before I take this medicine? They need to know if you have any of these conditions: -blood disorders -irregular heartbeat -infection (especially a virus infection such as chickenpox, cold sores, or herpes) -liver disease -previous or ongoing radiation therapy -an unusual or allergic reaction to paclitaxel, alcohol, polyoxyethylated castor oil, other chemotherapy agents, other medicines, foods, dyes, or preservatives -pregnant or trying to get pregnant -breast-feeding How should I use this medicine? This drug is given as an infusion into a vein. It is administered in a hospital or clinic by a specially trained health care professional. Talk to your pediatrician regarding the use of this medicine in children. Special care may be needed. Overdosage: If you think you have taken too much of this medicine contact a poison control center or emergency room at once. NOTE: This medicine is only for you. Do not share this medicine with others. What if I miss a dose? It is important not to miss your dose. Call your doctor or health care professional if you are unable to keep an appointment. What may interact with this medicine? Do not take this medicine with any of the following medications: -disulfiram -metronidazole This medicine may also interact with the following medications: -cyclosporine -diazepam -ketoconazole -medicines to increase blood counts like filgrastim, pegfilgrastim, sargramostim -other chemotherapy drugs like cisplatin, doxorubicin,  epirubicin, etoposide, teniposide, vincristine -quinidine -testosterone -vaccines -verapamil Talk to your doctor or health care professional before taking any of these medicines: -acetaminophen -aspirin -ibuprofen -ketoprofen -naproxen This list may not describe all possible interactions. Give your health care provider a list of all the medicines, herbs, non-prescription drugs, or dietary supplements you use. Also tell them if you smoke, drink alcohol, or use illegal drugs. Some items may interact with your medicine. What should I watch for while using this medicine? Your condition will be monitored carefully while you are receiving this medicine. You will need important blood work done while you are taking this medicine. This drug may make you feel generally unwell. This is not uncommon, as chemotherapy can affect healthy cells as well as cancer cells. Report any side effects. Continue your course of treatment even though you feel ill unless your doctor tells you to stop. In some cases, you may be given additional medicines to help with side effects. Follow all directions for their use. Call your doctor or health care professional for advice if you get a fever, chills or sore throat, or other symptoms of a cold or flu. Do not treat yourself. This drug decreases your body's ability to fight infections. Try to avoid being around people who are sick. This medicine may increase your risk to bruise or bleed. Call your doctor or health care professional if you notice any unusual bleeding. Be careful brushing and flossing your teeth or using a toothpick because you may get an infection or bleed more easily. If you have any dental work done, tell your dentist you are receiving this medicine. Avoid taking products that contain aspirin, acetaminophen, ibuprofen, naproxen, or ketoprofen unless instructed by your doctor. These medicines may hide a fever.   Do not become pregnant while taking this medicine.  Women should inform their doctor if they wish to become pregnant or think they might be pregnant. There is a potential for serious side effects to an unborn child. Talk to your health care professional or pharmacist for more information. Do not breast-feed an infant while taking this medicine. Men are advised not to father a child while receiving this medicine. What side effects may I notice from receiving this medicine? Side effects that you should report to your doctor or health care professional as soon as possible: -allergic reactions like skin rash, itching or hives, swelling of the face, lips, or tongue -low blood counts - This drug may decrease the number of white blood cells, red blood cells and platelets. You may be at increased risk for infections and bleeding. -signs of infection - fever or chills, cough, sore throat, pain or difficulty passing urine -signs of decreased platelets or bleeding - bruising, pinpoint red spots on the skin, black, tarry stools, nosebleeds -signs of decreased red blood cells - unusually weak or tired, fainting spells, lightheadedness -breathing problems -chest pain -high or low blood pressure -mouth sores -nausea and vomiting -pain, swelling, redness or irritation at the injection site -pain, tingling, numbness in the hands or feet -slow or irregular heartbeat -swelling of the ankle, feet, hands Side effects that usually do not require medical attention (report to your doctor or health care professional if they continue or are bothersome): -bone pain -complete hair loss including hair on your head, underarms, pubic hair, eyebrows, and eyelashes -changes in the color of fingernails -diarrhea -loosening of the fingernails -loss of appetite -muscle or joint pain -red flush to skin -sweating This list may not describe all possible side effects. Call your doctor for medical advice about side effects. You may report side effects to FDA at  1-800-FDA-1088. Where should I keep my medicine? This drug is given in a hospital or clinic and will not be stored at home. NOTE: This sheet is a summary. It may not cover all possible information. If you have questions about this medicine, talk to your doctor, pharmacist, or health care provider.  2015, Elsevier/Gold Standard. (2012-12-10 16:41:21)  

## 2014-10-27 ENCOUNTER — Other Ambulatory Visit (HOSPITAL_BASED_OUTPATIENT_CLINIC_OR_DEPARTMENT_OTHER): Payer: Medicaid Other

## 2014-10-27 ENCOUNTER — Ambulatory Visit (HOSPITAL_BASED_OUTPATIENT_CLINIC_OR_DEPARTMENT_OTHER): Payer: Medicaid Other

## 2014-10-27 DIAGNOSIS — C50412 Malignant neoplasm of upper-outer quadrant of left female breast: Secondary | ICD-10-CM

## 2014-10-27 DIAGNOSIS — C50812 Malignant neoplasm of overlapping sites of left female breast: Secondary | ICD-10-CM

## 2014-10-27 DIAGNOSIS — C773 Secondary and unspecified malignant neoplasm of axilla and upper limb lymph nodes: Secondary | ICD-10-CM

## 2014-10-27 DIAGNOSIS — Z5111 Encounter for antineoplastic chemotherapy: Secondary | ICD-10-CM

## 2014-10-27 DIAGNOSIS — C50912 Malignant neoplasm of unspecified site of left female breast: Secondary | ICD-10-CM

## 2014-10-27 DIAGNOSIS — C50911 Malignant neoplasm of unspecified site of right female breast: Secondary | ICD-10-CM

## 2014-10-27 LAB — CBC WITH DIFFERENTIAL/PLATELET
BASO%: 0.5 % (ref 0.0–2.0)
Basophils Absolute: 0.1 10*3/uL (ref 0.0–0.1)
EOS%: 2 % (ref 0.0–7.0)
Eosinophils Absolute: 0.3 10*3/uL (ref 0.0–0.5)
HCT: 34.1 % — ABNORMAL LOW (ref 34.8–46.6)
HGB: 10.7 g/dL — ABNORMAL LOW (ref 11.6–15.9)
LYMPH%: 14.7 % (ref 14.0–49.7)
MCH: 27.6 pg (ref 25.1–34.0)
MCHC: 31.5 g/dL (ref 31.5–36.0)
MCV: 87.7 fL (ref 79.5–101.0)
MONO#: 0.9 10*3/uL (ref 0.1–0.9)
MONO%: 5.9 % (ref 0.0–14.0)
NEUT#: 11.6 10*3/uL — ABNORMAL HIGH (ref 1.5–6.5)
NEUT%: 76.9 % — AB (ref 38.4–76.8)
PLATELETS: 392 10*3/uL (ref 145–400)
RBC: 3.88 10*6/uL (ref 3.70–5.45)
RDW: 20.5 % — ABNORMAL HIGH (ref 11.2–14.5)
WBC: 15.1 10*3/uL — AB (ref 3.9–10.3)
lymph#: 2.2 10*3/uL (ref 0.9–3.3)

## 2014-10-27 LAB — COMPREHENSIVE METABOLIC PANEL (CC13)
ALBUMIN: 3.8 g/dL (ref 3.5–5.0)
ALK PHOS: 78 U/L (ref 40–150)
ALT: 24 U/L (ref 0–55)
AST: 15 U/L (ref 5–34)
Anion Gap: 9 mEq/L (ref 3–11)
BUN: 14.7 mg/dL (ref 7.0–26.0)
CALCIUM: 9.2 mg/dL (ref 8.4–10.4)
CO2: 30 mEq/L — ABNORMAL HIGH (ref 22–29)
CREATININE: 0.7 mg/dL (ref 0.6–1.1)
Chloride: 98 mEq/L (ref 98–109)
Glucose: 201 mg/dl — ABNORMAL HIGH (ref 70–140)
Potassium: 3.8 mEq/L (ref 3.5–5.1)
Sodium: 138 mEq/L (ref 136–145)
Total Bilirubin: 0.22 mg/dL (ref 0.20–1.20)
Total Protein: 6.9 g/dL (ref 6.4–8.3)

## 2014-10-27 MED ORDER — DEXAMETHASONE SODIUM PHOSPHATE 10 MG/ML IJ SOLN
INTRAMUSCULAR | Status: AC
  Filled 2014-10-27: qty 1

## 2014-10-27 MED ORDER — FAMOTIDINE IN NACL 20-0.9 MG/50ML-% IV SOLN
20.0000 mg | Freq: Once | INTRAVENOUS | Status: AC
  Administered 2014-10-27: 20 mg via INTRAVENOUS

## 2014-10-27 MED ORDER — DIPHENHYDRAMINE HCL 50 MG/ML IJ SOLN
INTRAMUSCULAR | Status: AC
  Filled 2014-10-27: qty 1

## 2014-10-27 MED ORDER — DEXAMETHASONE SODIUM PHOSPHATE 10 MG/ML IJ SOLN
4.0000 mg | Freq: Once | INTRAMUSCULAR | Status: AC
  Administered 2014-10-27: 4 mg via INTRAVENOUS

## 2014-10-27 MED ORDER — DIPHENHYDRAMINE HCL 50 MG/ML IJ SOLN
25.0000 mg | Freq: Once | INTRAMUSCULAR | Status: AC
  Administered 2014-10-27: 25 mg via INTRAVENOUS

## 2014-10-27 MED ORDER — SODIUM CHLORIDE 0.9 % IJ SOLN
10.0000 mL | INTRAMUSCULAR | Status: DC | PRN
  Administered 2014-10-27: 10 mL
  Filled 2014-10-27: qty 10

## 2014-10-27 MED ORDER — PACLITAXEL CHEMO INJECTION 300 MG/50ML
80.0000 mg/m2 | Freq: Once | INTRAVENOUS | Status: AC
  Administered 2014-10-27: 162 mg via INTRAVENOUS
  Filled 2014-10-27: qty 27

## 2014-10-27 MED ORDER — HEPARIN SOD (PORK) LOCK FLUSH 100 UNIT/ML IV SOLN
500.0000 [IU] | Freq: Once | INTRAVENOUS | Status: AC | PRN
  Administered 2014-10-27: 500 [IU]
  Filled 2014-10-27: qty 5

## 2014-10-27 MED ORDER — ONDANSETRON 8 MG/50ML IVPB (CHCC)
8.0000 mg | Freq: Once | INTRAVENOUS | Status: AC
  Administered 2014-10-27: 8 mg via INTRAVENOUS

## 2014-10-27 MED ORDER — FAMOTIDINE IN NACL 20-0.9 MG/50ML-% IV SOLN
INTRAVENOUS | Status: AC
  Filled 2014-10-27: qty 50

## 2014-10-27 MED ORDER — SODIUM CHLORIDE 0.9 % IV SOLN
Freq: Once | INTRAVENOUS | Status: AC
  Administered 2014-10-27: 15:00:00 via INTRAVENOUS

## 2014-10-27 MED ORDER — ONDANSETRON 8 MG/NS 50 ML IVPB
INTRAVENOUS | Status: AC
  Filled 2014-10-27: qty 8

## 2014-10-27 NOTE — Patient Instructions (Signed)
[facility] Discharge Instructions for Patients Receiving Chemotherapy  Today you received the following chemotherapy agents: Taxol.  To help prevent nausea and vomiting after your treatment, we encourage you to take your nausea medication as prescribed.   If you develop nausea and vomiting that is not controlled by your nausea medication, call the clinic.   BELOW ARE SYMPTOMS THAT SHOULD BE REPORTED IMMEDIATELY:  *FEVER GREATER THAN 100.5 F  *CHILLS WITH OR WITHOUT FEVER  NAUSEA AND VOMITING THAT IS NOT CONTROLLED WITH YOUR NAUSEA MEDICATION  *UNUSUAL SHORTNESS OF BREATH  *UNUSUAL BRUISING OR BLEEDING  TENDERNESS IN MOUTH AND THROAT WITH OR WITHOUT PRESENCE OF ULCERS  *URINARY PROBLEMS  *BOWEL PROBLEMS  UNUSUAL RASH Items with * indicate a potential emergency and should be followed up as soon as possible.  Feel free to call the clinic you have any questions or concerns. The clinic phone number is [phone].    

## 2014-10-28 ENCOUNTER — Ambulatory Visit
Admission: RE | Admit: 2014-10-28 | Discharge: 2014-10-28 | Disposition: A | Discharge status: Home / Self Care | Payer: Medicaid Other | Source: Ambulatory Visit | Attending: Oncology | Admitting: Oncology

## 2014-10-28 ENCOUNTER — Other Ambulatory Visit: Payer: Self-pay | Admitting: Oncology

## 2014-10-28 DIAGNOSIS — N63 Unspecified lump in unspecified breast: Secondary | ICD-10-CM

## 2014-10-28 DIAGNOSIS — N644 Mastodynia: Secondary | ICD-10-CM

## 2014-11-03 ENCOUNTER — Other Ambulatory Visit: Payer: Self-pay | Admitting: *Deleted

## 2014-11-03 ENCOUNTER — Other Ambulatory Visit: Payer: Self-pay

## 2014-11-03 ENCOUNTER — Telehealth: Payer: Self-pay | Admitting: Oncology

## 2014-11-03 ENCOUNTER — Ambulatory Visit: Payer: Self-pay

## 2014-11-03 ENCOUNTER — Encounter: Payer: Self-pay | Admitting: Oncology

## 2014-11-03 ENCOUNTER — Other Ambulatory Visit (HOSPITAL_BASED_OUTPATIENT_CLINIC_OR_DEPARTMENT_OTHER): Payer: Medicaid Other

## 2014-11-03 ENCOUNTER — Ambulatory Visit (HOSPITAL_BASED_OUTPATIENT_CLINIC_OR_DEPARTMENT_OTHER): Payer: Medicaid Other | Admitting: Oncology

## 2014-11-03 ENCOUNTER — Telehealth: Payer: Self-pay | Admitting: *Deleted

## 2014-11-03 VITALS — BP 136/80 | HR 97 | Temp 98.5°F | Resp 18 | Ht 70.0 in | Wt 179.4 lb

## 2014-11-03 DIAGNOSIS — C50812 Malignant neoplasm of overlapping sites of left female breast: Secondary | ICD-10-CM

## 2014-11-03 DIAGNOSIS — Z170 Estrogen receptor positive status [ER+]: Secondary | ICD-10-CM

## 2014-11-03 DIAGNOSIS — C50412 Malignant neoplasm of upper-outer quadrant of left female breast: Secondary | ICD-10-CM

## 2014-11-03 DIAGNOSIS — C773 Secondary and unspecified malignant neoplasm of axilla and upper limb lymph nodes: Secondary | ICD-10-CM

## 2014-11-03 DIAGNOSIS — C50912 Malignant neoplasm of unspecified site of left female breast: Secondary | ICD-10-CM

## 2014-11-03 LAB — CBC WITH DIFFERENTIAL/PLATELET
BASO%: 0.6 % (ref 0.0–2.0)
Basophils Absolute: 0 10*3/uL (ref 0.0–0.1)
EOS ABS: 0.1 10*3/uL (ref 0.0–0.5)
EOS%: 1.3 % (ref 0.0–7.0)
HCT: 36.8 % (ref 34.8–46.6)
HGB: 11.6 g/dL (ref 11.6–15.9)
LYMPH%: 18 % (ref 14.0–49.7)
MCH: 28.4 pg (ref 25.1–34.0)
MCHC: 31.6 g/dL (ref 31.5–36.0)
MCV: 90 fL (ref 79.5–101.0)
MONO#: 0.5 10*3/uL (ref 0.1–0.9)
MONO%: 6.6 % (ref 0.0–14.0)
NEUT%: 73.5 % (ref 38.4–76.8)
NEUTROS ABS: 5.4 10*3/uL (ref 1.5–6.5)
PLATELETS: 351 10*3/uL (ref 145–400)
RBC: 4.09 10*6/uL (ref 3.70–5.45)
RDW: 20.2 % — ABNORMAL HIGH (ref 11.2–14.5)
WBC: 7.3 10*3/uL (ref 3.9–10.3)
lymph#: 1.3 10*3/uL (ref 0.9–3.3)

## 2014-11-03 MED ORDER — OXYCODONE-ACETAMINOPHEN 5-325 MG PO TABS: 1.0000 | ORAL_TABLET | Freq: Three times a day (TID) | ORAL | Status: DC | PRN

## 2014-11-03 NOTE — Progress Notes (Signed)
[name]  Telephone:([phone]) [phone] Fax:([phone]) [phone]     ID: Laura Matthews DOB: 1966/02/09  MR#: [id number]  HYQ#:[id number]  Patient Care Team: [name], MD as PCP - General (Family Medicine) Stark Klein, MD as Consulting Physician (General Surgery) Chauncey Cruel, MD as Consulting Physician (Oncology) Thea Silversmith, MD as Consulting Physician (Radiation Oncology) [facility] Bunnie Pion, NP as Nurse Practitioner (Nurse Practitioner)  CHIEF COMPLAINT: Stage III left breast cancer  CURRENT TREATMENT:adjuvant chemotherapy  BREAST CANCER HISTORY: From the original intake note:  The patient herself palpated a mass in her left breast and brought it to the attention of [facility] NP 04/22/2014. She palpated a large nontender mass at the 4:00 position as well as a small tender mass in the 6:00 position of the left breast. The patient was set up for imaging at the breast Center where on 05/05/2014 she underwent bilateral diagnostic mammography and left breast ultrasonography. The mammogram showed an irregular mass in the outer left breast with no other suspicious findings. This was firm and fixed at the 3:00 position approximately 10 cm from the nipple. Ultrasound showed a hypoechoic irregular solid mass in this area, measuring 1.8 cm. Ultrasound of the left axilla showed 2 suspicious level I lymph nodes, the largest measuring 1 cm, with a thickened pole. A smaller lymph node measured 0.6 cm but had no visible fatty hilum.  On 05/09/2014 the patient underwent biopsy of the left breast mass (she refused biopsy of the left axilla) which showed ([id number]) and invasive ductal carcinoma, grade 2, estrogen receptor positive at 100%, with strong staining intensity, progesterone receptor 80% positive, with weak staining intensity, with an MIB-1 of 80%, and no HER-2 amplification.  On 05/16/2014 the patient underwent bilateral breast MRI. This showed a 1.7 cm irregular enhancing  mass at the 3:00 position of the left breast associated with the biopsy cleft. There were 3 level I left axillary lymph nodes with thickened cortices. The largest measured 1.3 cm. There were no other findings of concern.  Her subsequent history is as detailed below.  INTERVAL HISTORY: Laura Matthews returns for follow up of her breast cancer. Currently receiving weekly Taxol. Here for cycle 4.  REVIEW OF SYSTEMS:  Laura Matthews denies fever or chills. Has intermittent nausea. Not vomiting. Taking her antiemetics which are effective. She continues to have left breast pain, for which she is taking oxycodone-APAP. Reports that breast feels swollen and hard. Thinks it is becoming more swollen and painful. Received a course of Keflex 2 weeks ago without much change in her symptoms. Had a mammogram on 12/29 to evaluate this breast which showed a large pocket of fluid in the breast with questionable infection vs. Chemotherapy-effect. Patient was to be seen by surgery on 12/29, but she declined. She also had an appointment with them for 12/31, but she cancelled. Feels very tired today. Denies fevers and chills. She denies shortness of breath, chest pain, cough, or palpitations. A detailed review of systems is otherwise negative.   PAST MEDICAL HISTORY: Past Medical History  Diagnosis Date  . Mental disorder     depression-  . Arthritis   . Hot flashes   . Head ache   . Malignant neoplasm of breast (female), unspecified site   . Family history of malignant neoplasm of breast   . Depression     pt denies   . Anemia     low iron    PAST SURGICAL HISTORY: Past Surgical History  Procedure Laterality Date  .  Knee surgery  1995    right  . Foot surgery  2011    rt  . Tubal ligation    . Uterine fibroid surgery  2005    ablation  . Breast lumpectomy with sentinel lymph node biopsy Left 06/11/2014    Procedure: BREAST LUMPECTOMY WITH SENTINEL LYMPH NODE BX, POSSIBLE AXILLARY LYMPH NODE DISSECTION;  Surgeon: Stark Klein, MD;  Location: [facility];  Service: General;  Laterality: Left;  . Portacath placement N/A 07/02/2014    Procedure: INSERTION PORT-A-CATH;  Surgeon: Stark Klein, MD;  Location: [facility];  Service: General;  Laterality: N/A;  . Incision and drainage Left 07/02/2014    Procedure: Drainage left breast seroma;  Surgeon: Stark Klein, MD;  Location: [facility];  Service: General;  Laterality: Left;  . Evacuation breast hematoma Left 07/10/2014    Procedure: EVACUATION OF LEFT BREAST HEMATOMA ;  Surgeon: Stark Klein, MD;  Location: [facility];  Service: General;  Laterality: Left;    FAMILY HISTORY Family History  Problem Relation Age of Onset  . Breast cancer Mother 67    currently 73; TAH/BSO d/t ?cervical ca at 61  . Breast cancer Maternal Aunt     dx 54s; currently in her late 34s  . Lung cancer Father     deceased 48   the patient's father died at the age of 72 from lung cancer. The patient's mother was diagnosed with breast cancer at the age of 27. She survives. The patient had 2 brothers, and 2 sisters. One brother may have a diagnosis of "stomach cancer"  GYNECOLOGIC HISTORY:  No LMP recorded. Patient has had an ablation. Menarche age 53 which is also when the patient first had a child. She is GX P3. She stopped having periods after laser ablation in 2005.  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 She has lived with her daughter Laura Matthews and her 4 children who are 35, 8, 5, and 4. 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 More recently the patient is living in a shelter. The patient's son Laura Matthews is a fast food cook in [place] and the patient's daughter Laura Matthews works as a Scientist, water quality, also in [place]. The patient has 7 grandchildren. She is not a church attender    ADVANCED DIRECTIVES: Not in place; this was discussed with the patient on her first visit, 05/21/2014, and she indicated she  plans to name her daughter Laura Matthews as her healthcare power of attorney   HEALTH MAINTENANCE: History  Substance Use Topics  . Smoking status: Current Every Day Smoker -- 0.25 packs/day    Types: Cigarettes  . Smokeless tobacco: Never Used  . Alcohol Use: No     Comment: Percoset,Vicodin, Oxycotin     Colonoscopy:  PAP:  Bone density:  Lipid panel:  Allergies  Allergen Reactions  . Norco [Hydrocodone-Acetaminophen] Itching and Nausea Only    Pt reported allergy.    Current Outpatient Prescriptions  Medication Sig Dispense Refill  . Alum & Mag Hydroxide-Simeth (MAGIC MOUTHWASH) SOLN Take 5 mLs by mouth 4 (four) times daily as needed for mouth pain.    Marland Kitchen lidocaine-prilocaine (EMLA) cream Apply 1 application topically as needed. 30 g 0  . LORazepam (ATIVAN) 0.5 MG tablet Take 1 tablet (0.5 mg total) by mouth every 6 (six) hours as needed (Nausea or vomiting). 30 tablet 0  . ondansetron (ZOFRAN ODT) 8 MG disintegrating tablet Take 1  tablet (8 mg total) by mouth every 8 (eight) hours as needed for nausea or vomiting. 20 tablet 0  . Oxycodone HCl 20 MG TABS Take 1 tablet (20 mg total) by mouth every 4 (four) hours as needed (pain). 15 tablet 0  . oxyCODONE-acetaminophen (PERCOCET/ROXICET) 5-325 MG per tablet Take 1 tablet by mouth every 8 (eight) hours as needed for severe pain. 30 tablet 0  . dexamethasone (DECADRON) 4 MG tablet TAKE 2 TABLETS DAILY ON THE DAY AFTER CHEMO THEN 2 TABLETS 2 TIMES DAILY FOR 2 DAYS WITH FOOD (Patient not taking: Reported on 11/03/2014) 30 tablet 0  . fluconazole (DIFLUCAN) 100 MG tablet Take 1 tablet (100 mg total) by mouth daily. (Patient not taking: Reported on 10/20/2014) 15 tablet 2  . ibuprofen (ADVIL,MOTRIN) 200 MG tablet Take 1 tablet (200 mg total) by mouth every 6 (six) hours as needed for moderate pain. (Patient not taking: Reported on 11/03/2014) 30 tablet 0  . nicotine (NICODERM CQ) 21 mg/24hr patch Place 1 patch (21 mg total) onto the skin daily.  (Patient not taking: Reported on 09/17/2014) 28 patch 1  . omeprazole (PRILOSEC) 40 MG capsule Take 1 capsule (40 mg total) by mouth at bedtime. (Patient not taking: Reported on 11/03/2014) 30 capsule 6  . ranitidine (ZANTAC) 150 MG tablet Take 1 tablet (150 mg total) by mouth 2 (two) times daily. (Patient not taking: Reported on 11/03/2014) 60 tablet 0   No current facility-administered medications for this visit.   Facility-Administered Medications Ordered in Other Visits  Medication Dose Route Frequency Provider Last Rate Last Dose  . sodium chloride 0.9 % injection 10 mL  10 mL Intracatheter PRN Chauncey Cruel, MD   10 mL at 07/29/14 1704    OBJECTIVE: Middle-aged [place] American woman who appears stated age 84 Vitals:   11/03/14 1305  BP: 136/80  Pulse: 97  Temp: 98.5 F (36.9 C)  Resp: 18     Body mass index is 25.74 kg/(m^2).    ECOG FS:1 - Symptomatic but completely ambulatory  Sclerae unicteric, pupils equal and reactive Oropharynx clear and moist-- no thrush No cervical or supraclavicular adenopathy Lungs no rales or rhonchi Heart regular rate and rhythm Abd soft, nontender, positive bowel sounds MSK no focal spinal tenderness, no upper extremity lymphedema Neuro: nonfocal, well oriented, appropriate affect Breasts: Left breast is swollen and under lumpectomy site. Tender to touch. Mild erythema, but no drainage.  LAB RESULTS:  CMP     Component Value Date/Time   NA 138 10/27/2014 1403   NA 136* 09/17/2014 0700   K 3.8 10/27/2014 1403   K 4.0 09/17/2014 0700   CL 99 09/17/2014 0700   CO2 30* 10/27/2014 1403   CO2 26 09/17/2014 0700   GLUCOSE 201* 10/27/2014 1403   GLUCOSE 359* 09/17/2014 0700   BUN 14.7 10/27/2014 1403   BUN 11 09/17/2014 0700   CREATININE 0.7 10/27/2014 1403   CREATININE 0.42* 09/17/2014 0700   CALCIUM 9.2 10/27/2014 1403   CALCIUM 9.2 09/17/2014 0700   PROT 6.9 10/27/2014 1403   PROT 6.5 09/17/2014 0700   ALBUMIN 3.8 10/27/2014 1403    ALBUMIN 3.2* 09/17/2014 0700   AST 15 10/27/2014 1403   AST 11 09/17/2014 0700   ALT 24 10/27/2014 1403   ALT 20 09/17/2014 0700   ALKPHOS 78 10/27/2014 1403   ALKPHOS 131* 09/17/2014 0700   BILITOT 0.22 10/27/2014 1403   BILITOT <0.2* 09/17/2014 0700   GFRNONAA >90 09/17/2014 0700   GFRAA >  90 09/17/2014 0700    I No results found for: SPEP  Lab Results  Component Value Date   WBC 7.3 11/03/2014   NEUTROABS 5.4 11/03/2014   HGB 11.6 11/03/2014   HCT 36.8 11/03/2014   MCV 90.0 11/03/2014   PLT 351 11/03/2014      Chemistry      Component Value Date/Time   NA 138 10/27/2014 1403   NA 136* 09/17/2014 0700   K 3.8 10/27/2014 1403   K 4.0 09/17/2014 0700   CL 99 09/17/2014 0700   CO2 30* 10/27/2014 1403   CO2 26 09/17/2014 0700   BUN 14.7 10/27/2014 1403   BUN 11 09/17/2014 0700   CREATININE 0.7 10/27/2014 1403   CREATININE 0.42* 09/17/2014 0700      Component Value Date/Time   CALCIUM 9.2 10/27/2014 1403   CALCIUM 9.2 09/17/2014 0700   ALKPHOS 78 10/27/2014 1403   ALKPHOS 131* 09/17/2014 0700   AST 15 10/27/2014 1403   AST 11 09/17/2014 0700   ALT 24 10/27/2014 1403   ALT 20 09/17/2014 0700   BILITOT 0.22 10/27/2014 1403   BILITOT <0.2* 09/17/2014 0700       No results found for: LABCA2  No components found for: LABCA125  No results for input(s): INR in the last 168 hours.  Urinalysis    Component Value Date/Time   COLORURINE YELLOW 06/03/2014 1050   APPEARANCEUR CLEAR 06/03/2014 1050   LABSPEC >1.030* 06/03/2014 1050   PHURINE 6.0 06/03/2014 1050   GLUCOSEU NEGATIVE 06/03/2014 1050   HGBUR SMALL* 06/03/2014 1050   BILIRUBINUR SMALL* 06/03/2014 1050   KETONESUR NEGATIVE 06/03/2014 1050   PROTEINUR NEGATIVE 06/03/2014 1050   UROBILINOGEN 0.2 06/03/2014 1050   NITRITE NEGATIVE 06/03/2014 1050   LEUKOCYTESUR TRACE* 06/03/2014 1050    STUDIES: Mm Digital Diagnostic Unilat L  10/28/2014   CLINICAL DATA:  Patient has swelling and pain from  her left breast. She underwent lumpectomy for left breast carcinoma in August 2015 and is currently undergoing chemotherapy. She reports that she is had swelling from this breast well as pain since the surgery. She also reports that aspiration has been previously attempted.  EXAM: DIGITAL DIAGNOSTIC  LEFT MAMMOGRAM WITH CAD  ULTRASOUND LEFT BREAST  COMPARISON:  Prior exams  ACR Breast Density Category b: There are scattered areas of fibroglandular density.  FINDINGS: There is diffuse skin thickening as well as thickening of the suspensory ligaments throughout the left breast. Hazy opacity is seen throughout the lumpectomy site. No discrete masses seen mammographically. The most recent prior mammogram dated 05/09/2014 showed a post biopsy clip a more discrete mass. The area diffuse increased density is new since the prior study, but there are no other postlumpectomy mammograms.  Mammographic images were processed with CAD.  On physical exam, patient's breasts is swollen with an area of masslike fullness deep to the upper outer quadrant skin incision site.  Ultrasound is performed, showing a large fluid collection deep to the surgical incision in the upper outer quadrant. This is centered at 2 o'clock, 7 cm the nipple. It measures approximately 5.7 cm x 2.1 cm x 4.4 cm. This is a simple fluid collection with no internal blood flow or septations. There are no sonographic solid masses to suggest residual or recurrent breast carcinoma. No axillary masses or adenopathy is seen.  IMPRESSION: Benign, presumed postoperative fluid collection deep to the left breast upper outer quadrant skin incision, measuring 5.7 cm in greatest dimension. This could potentially be an infected   collection given the patient's symptoms and the evidence of diffuse breast edema. Alternatively, the diffuse breast edema may be related to chemotherapy.  No evidence of residual or new breast malignancy.  RECOMMENDATION: Surgical consultation for  either aspiration or incision and drainage. Patient was offered an appointment with the surgeon's office today 2:30pm, but did not want to go to that appointment. Patient will be contacted by the surgeons office for a future appointment.  I have discussed the findings and recommendations with the patient. Results were also provided in writing at the conclusion of the visit. If applicable, a reminder letter will be sent to the patient regarding the next appointment.  BI-RADS CATEGORY  2: Benign.   Electronically Signed   By: David  Ormond M.D.   On: 10/28/2014 14:30   Us Breast Ltd Uni Left Inc Axilla  10/28/2014   CLINICAL DATA:  Patient has swelling and pain from her left breast. She underwent lumpectomy for left breast carcinoma in August 2015 and is currently undergoing chemotherapy. She reports that she is had swelling from this breast well as pain since the surgery. She also reports that aspiration has been previously attempted.  EXAM: DIGITAL DIAGNOSTIC  LEFT MAMMOGRAM WITH CAD  ULTRASOUND LEFT BREAST  COMPARISON:  Prior exams  ACR Breast Density Category b: There are scattered areas of fibroglandular density.  FINDINGS: There is diffuse skin thickening as well as thickening of the suspensory ligaments throughout the left breast. Hazy opacity is seen throughout the lumpectomy site. No discrete masses seen mammographically. The most recent prior mammogram dated 05/09/2014 showed a post biopsy clip a more discrete mass. The area diffuse increased density is new since the prior study, but there are no other postlumpectomy mammograms.  Mammographic images were processed with CAD.  On physical exam, patient's breasts is swollen with an area of masslike fullness deep to the upper outer quadrant skin incision site.  Ultrasound is performed, showing a large fluid collection deep to the surgical incision in the upper outer quadrant. This is centered at 2 o'clock, 7 cm the nipple. It measures approximately 5.7 cm x  2.1 cm x 4.4 cm. This is a simple fluid collection with no internal blood flow or septations. There are no sonographic solid masses to suggest residual or recurrent breast carcinoma. No axillary masses or adenopathy is seen.  IMPRESSION: Benign, presumed postoperative fluid collection deep to the left breast upper outer quadrant skin incision, measuring 5.7 cm in greatest dimension. This could potentially be an infected collection given the patient's symptoms and the evidence of diffuse breast edema. Alternatively, the diffuse breast edema may be related to chemotherapy.  No evidence of residual or new breast malignancy.  RECOMMENDATION: Surgical consultation for either aspiration or incision and drainage. Patient was offered an appointment with the surgeon's office today 2:30pm, but did not want to go to that appointment. Patient will be contacted by the surgeons office for a future appointment.  I have discussed the findings and recommendations with the patient. Results were also provided in writing at the conclusion of the visit. If applicable, a reminder letter will be sent to the patient regarding the next appointment.  BI-RADS CATEGORY  2: Benign.   Electronically Signed   By: David  Ormond M.D.   On: 10/28/2014 14:30     ASSESSMENT: 48 y.o. [place] woman status post left breast upper outer quadrant biopsy 05/09/2014 for a clinicalT1c N1, stage IIA invasive ductal carcinoma, grade 2, estrogen and progesterone receptor positive, HER-2 nonamplified,   with an MIB-1 of 80%  (1) patient met with genetics counselor 05/29/2014 but decided against genetics testing   (2) tobacco abuse. The patient has been strongly urged to discontinue smoking. She was prescribed a nicotine patch 09/02/2014  (3) status post left lumpectomy and axillary lymph node dissection 06/11/2014 for a pT1c pN3, stage IIIC invasive ductal carcinoma, grade 3, with repeat HER-2 negative  (4) adjuvant chemotherapy to consist of  doxorubicin and cyclophosphamide in dose dense fashion x4, with Neulasta support, started 07/29/2014, to be followed by paclitaxel weekly x12  (5) adjuvant radiation to follow chemotherapy  (6) anti-estrogens for 5-10 years to follow radiation  PLAN: Devinne is more tired today. The labs were reviewed in detail and was entirely stable. She is having more breast pain due to a large fluid accumulation. There is a question of possible infection on the mammogram. I have explained to the patient that this fluid collection needs to be evaluated and that I am concerned giving her more chemo today could increase her risk for infection. After discussing this, she agrees with holding chemo today and pursuing evaluation by her surgeon. I have referred her back to Dr. Byerly's office.    I have refilled her Percocet today for her left breast pain. I will not give any further antibiotics at this time as she is afebrile, has a normal WBC, and the previous course of Keflex did not really improve her symptoms. However, if she develops worsening pain, fever, redness, and any other concerning symptoms to her left breast she is to call our office or be seen in the ER for further management.  I will plan to see her back next week for possible chemo depending on the outcome of  Her evaluation with the surgeon.  She understands and agrees with this plan. She knows the goal of treatment in her case is cure. She has been encouraged to call with any issues that might arise before her next visit here.    , , NP 11/03/2014 

## 2014-11-03 NOTE — Telephone Encounter (Signed)
gv and printed appt sched and avs for pt fjor jan

## 2014-11-03 NOTE — Telephone Encounter (Signed)
Called and informed patient that a referral can not be made to her surgeon through our office and that she will need to be seen by her pcp or any regional physicians urgent care to have a referral made.  Per Mikey Bussing.  Patient verbalized understanding.

## 2014-11-07 ENCOUNTER — Other Ambulatory Visit: Payer: Self-pay | Admitting: Oncology

## 2014-11-07 NOTE — Progress Notes (Unsigned)
Hope Bunnie Pion, NP  Chauncey Cruel, MD           I referred this patient from a visit in the ED. I do not have an office or way to follow this patient. Thank you for the follow up information but for future the information can be sent to the [facility] Thank you

## 2014-11-10 ENCOUNTER — Other Ambulatory Visit (HOSPITAL_BASED_OUTPATIENT_CLINIC_OR_DEPARTMENT_OTHER): Payer: Medicaid Other

## 2014-11-10 ENCOUNTER — Other Ambulatory Visit: Payer: Self-pay

## 2014-11-10 ENCOUNTER — Ambulatory Visit: Payer: Self-pay | Admitting: Oncology

## 2014-11-10 ENCOUNTER — Ambulatory Visit (HOSPITAL_BASED_OUTPATIENT_CLINIC_OR_DEPARTMENT_OTHER): Payer: Medicaid Other

## 2014-11-10 ENCOUNTER — Encounter: Payer: Self-pay | Admitting: Oncology

## 2014-11-10 ENCOUNTER — Other Ambulatory Visit: Payer: Self-pay | Admitting: Oncology

## 2014-11-10 ENCOUNTER — Ambulatory Visit: Payer: Self-pay

## 2014-11-10 ENCOUNTER — Ambulatory Visit (HOSPITAL_BASED_OUTPATIENT_CLINIC_OR_DEPARTMENT_OTHER): Payer: Medicaid Other | Admitting: Oncology

## 2014-11-10 ENCOUNTER — Other Ambulatory Visit: Payer: Self-pay | Admitting: Nurse Practitioner

## 2014-11-10 VITALS — BP 128/78 | HR 94 | Temp 98.7°F | Resp 18 | Ht 70.0 in | Wt 178.9 lb

## 2014-11-10 DIAGNOSIS — C50812 Malignant neoplasm of overlapping sites of left female breast: Secondary | ICD-10-CM

## 2014-11-10 DIAGNOSIS — C50412 Malignant neoplasm of upper-outer quadrant of left female breast: Secondary | ICD-10-CM

## 2014-11-10 DIAGNOSIS — C773 Secondary and unspecified malignant neoplasm of axilla and upper limb lymph nodes: Secondary | ICD-10-CM

## 2014-11-10 DIAGNOSIS — Z5111 Encounter for antineoplastic chemotherapy: Secondary | ICD-10-CM

## 2014-11-10 DIAGNOSIS — C50911 Malignant neoplasm of unspecified site of right female breast: Secondary | ICD-10-CM

## 2014-11-10 DIAGNOSIS — C50912 Malignant neoplasm of unspecified site of left female breast: Secondary | ICD-10-CM

## 2014-11-10 LAB — CBC WITH DIFFERENTIAL/PLATELET
BASO%: 0.5 % (ref 0.0–2.0)
BASOS ABS: 0 10*3/uL (ref 0.0–0.1)
EOS%: 1.6 % (ref 0.0–7.0)
Eosinophils Absolute: 0.1 10*3/uL (ref 0.0–0.5)
HCT: 32.9 % — ABNORMAL LOW (ref 34.8–46.6)
HGB: 10.4 g/dL — ABNORMAL LOW (ref 11.6–15.9)
LYMPH#: 1.2 10*3/uL (ref 0.9–3.3)
LYMPH%: 18.5 % (ref 14.0–49.7)
MCH: 28.6 pg (ref 25.1–34.0)
MCHC: 31.6 g/dL (ref 31.5–36.0)
MCV: 90.3 fL (ref 79.5–101.0)
MONO#: 0.5 10*3/uL (ref 0.1–0.9)
MONO%: 8.1 % (ref 0.0–14.0)
NEUT#: 4.7 10*3/uL (ref 1.5–6.5)
NEUT%: 71.3 % (ref 38.4–76.8)
PLATELETS: 294 10*3/uL (ref 145–400)
RBC: 3.64 10*6/uL — ABNORMAL LOW (ref 3.70–5.45)
RDW: 19.6 % — AB (ref 11.2–14.5)
WBC: 6.6 10*3/uL (ref 3.9–10.3)

## 2014-11-10 MED ORDER — HEPARIN SOD (PORK) LOCK FLUSH 100 UNIT/ML IV SOLN
500.0000 [IU] | Freq: Once | INTRAVENOUS | Status: AC | PRN
  Administered 2014-11-10: 500 [IU]
  Filled 2014-11-10: qty 5

## 2014-11-10 MED ORDER — DIPHENHYDRAMINE HCL 50 MG/ML IJ SOLN
INTRAMUSCULAR | Status: AC
  Filled 2014-11-10: qty 1

## 2014-11-10 MED ORDER — SODIUM CHLORIDE 0.9 % IJ SOLN
10.0000 mL | INTRAMUSCULAR | Status: DC | PRN
Start: 2014-11-10 — End: 2014-11-10
  Administered 2014-11-10: 10 mL
  Filled 2014-11-10: qty 10

## 2014-11-10 MED ORDER — OXYCODONE-ACETAMINOPHEN 5-325 MG PO TABS: 1.0000 | ORAL_TABLET | Freq: Three times a day (TID) | ORAL | Status: DC | PRN

## 2014-11-10 MED ORDER — DEXTROSE 5 % IV SOLN
80.0000 mg/m2 | Freq: Once | INTRAVENOUS | Status: AC
  Administered 2014-11-10: 162 mg via INTRAVENOUS
  Filled 2014-11-10: qty 27

## 2014-11-10 MED ORDER — DEXAMETHASONE SODIUM PHOSPHATE 10 MG/ML IJ SOLN
INTRAMUSCULAR | Status: AC
Start: 2014-11-10 — End: 2014-11-10
  Filled 2014-11-10: qty 1

## 2014-11-10 MED ORDER — FAMOTIDINE IN NACL 20-0.9 MG/50ML-% IV SOLN
20.0000 mg | Freq: Once | INTRAVENOUS | Status: AC
  Administered 2014-11-10: 20 mg via INTRAVENOUS

## 2014-11-10 MED ORDER — SODIUM CHLORIDE 0.9 % IV SOLN
Freq: Once | INTRAVENOUS | Status: AC
  Administered 2014-11-10: 15:00:00 via INTRAVENOUS

## 2014-11-10 MED ORDER — ONDANSETRON 8 MG/NS 50 ML IVPB
INTRAVENOUS | Status: AC
  Filled 2014-11-10: qty 8

## 2014-11-10 MED ORDER — FAMOTIDINE IN NACL 20-0.9 MG/50ML-% IV SOLN
INTRAVENOUS | Status: AC
Start: 2014-11-10 — End: 2014-11-10
  Filled 2014-11-10: qty 50

## 2014-11-10 MED ORDER — ONDANSETRON 8 MG/50ML IVPB (CHCC)
8.0000 mg | Freq: Once | INTRAVENOUS | Status: AC
  Administered 2014-11-10: 8 mg via INTRAVENOUS

## 2014-11-10 MED ORDER — DEXAMETHASONE SODIUM PHOSPHATE 10 MG/ML IJ SOLN
4.0000 mg | Freq: Once | INTRAMUSCULAR | Status: AC
  Administered 2014-11-10: 4 mg via INTRAVENOUS

## 2014-11-10 MED ORDER — DIPHENHYDRAMINE HCL 50 MG/ML IJ SOLN
25.0000 mg | Freq: Once | INTRAMUSCULAR | Status: AC
  Administered 2014-11-10: 25 mg via INTRAVENOUS

## 2014-11-10 NOTE — Patient Instructions (Signed)
[facility] Discharge Instructions for Patients Receiving Chemotherapy  Today you received the following chemotherapy agent: Taxol   To help prevent nausea and vomiting after your treatment, we encourage you to take your nausea medication as prescribed.    If you develop nausea and vomiting that is not controlled by your nausea medication, call the clinic.   BELOW ARE SYMPTOMS THAT SHOULD BE REPORTED IMMEDIATELY:  *FEVER GREATER THAN 100.5 F  *CHILLS WITH OR WITHOUT FEVER  NAUSEA AND VOMITING THAT IS NOT CONTROLLED WITH YOUR NAUSEA MEDICATION  *UNUSUAL SHORTNESS OF BREATH  *UNUSUAL BRUISING OR BLEEDING  TENDERNESS IN MOUTH AND THROAT WITH OR WITHOUT PRESENCE OF ULCERS  *URINARY PROBLEMS  *BOWEL PROBLEMS  UNUSUAL RASH Items with * indicate a potential emergency and should be followed up as soon as possible.  Feel free to call the clinic you have any questions or concerns. The clinic phone number is [phone].    

## 2014-11-10 NOTE — Progress Notes (Signed)
[place]  [phone] [phone]     ID: Laura Matthews DOB: January 04, 1966  MR#: [id number]  TMA#:[id number]  Patient Care Team: [name], MD as PCP - General (Family Medicine) Stark Klein, MD as Consulting Physician (General Surgery) Chauncey Cruel, MD as Consulting Physician (Oncology) Thea Silversmith, MD as Consulting Physician (Radiation Oncology) [facility] Bunnie Pion, NP as Nurse Practitioner (Nurse Practitioner)  CHIEF COMPLAINT: Stage III left breast cancer  CURRENT TREATMENT:adjuvant chemotherapy  BREAST CANCER HISTORY: From the original intake note:  The patient herself palpated a mass in her left breast and brought it to the attention of [facility] NP 04/22/2014. She palpated a large nontender mass at the 4:00 position as well as a small tender mass in the 6:00 position of the left breast. The patient was set up for imaging at the breast Center where on 05/05/2014 she underwent bilateral diagnostic mammography and left breast ultrasonography. The mammogram showed an irregular mass in the outer left breast with no other suspicious findings. This was firm and fixed at the 3:00 position approximately 10 cm from the nipple. Ultrasound showed a hypoechoic irregular solid mass in this area, measuring 1.8 cm. Ultrasound of the left axilla showed 2 suspicious level I lymph nodes, the largest measuring 1 cm, with a thickened pole. A smaller lymph node measured 0.6 cm but had no visible fatty hilum.  On 05/09/2014 the patient underwent biopsy of the left breast mass (she refused biopsy of the left axilla) which showed ([id number]) and invasive ductal carcinoma, grade 2, estrogen receptor positive at 100%, with strong staining intensity, progesterone receptor 80% positive, with weak staining intensity, with an MIB-1 of 80%, and no HER-2 amplification.  On 05/16/2014 the patient underwent bilateral breast MRI. This showed a 1.7 cm irregular enhancing  mass at the 3:00 position of the left breast associated with the biopsy cleft. There were 3 level I left axillary lymph nodes with thickened cortices. The largest measured 1.3 cm. There were no other findings of concern.  Her subsequent history is as detailed below.  INTERVAL HISTORY: Laura Matthews returns for follow up of her breast cancer. Currently receiving weekly Taxol. Here for cycle 4. Chemotherapy was held last week due to awaiting evaluation of her left breast by surgeon.  REVIEW OF SYSTEMS:  Anvita denies fever or chills. Has intermittent nausea. Not vomiting. Taking her antiemetics which are effective. She continues to have left breast pain, for which she is taking oxycodone-APAP. Pain is now worsening. Reports that breast feels swollen and hard. Received a course of Keflex 2 weeks ago without much change in her symptoms. Had a mammogram on 12/29 to evaluate this breast which showed a large pocket of fluid in the breast with questionable infection vs. Chemotherapy-effect. Patient was to be seen by surgery on 12/29, but she declined. She also had an appointment with them for 12/31, but she cancelled. We were able to get her back in with the surgeon last week, but she was not able to be seen due to insurance issues. We are currently awaiting a referral from the PCP on her insurance card, but they cannot see her until February 24. Feels very tired today. Denies fevers and chills. She denies shortness of breath, chest pain, cough, or palpitations. A detailed review of systems is otherwise negative.   PAST MEDICAL HISTORY: Past Medical History  Diagnosis Date  . Mental disorder     depression-  . Arthritis   . Hot flashes   .  Head ache   . Malignant neoplasm of breast (female), unspecified site   . Family history of malignant neoplasm of breast   . Depression     pt denies   . Anemia     low iron    PAST SURGICAL HISTORY: Past Surgical History  Procedure Laterality Date  . Knee surgery   1995    right  . Foot surgery  2011    rt  . Tubal ligation    . Uterine fibroid surgery  2005    ablation  . Breast lumpectomy with sentinel lymph node biopsy Left 06/11/2014    Procedure: BREAST LUMPECTOMY WITH SENTINEL LYMPH NODE BX, POSSIBLE AXILLARY LYMPH NODE DISSECTION;  Surgeon: Stark Klein, MD;  Location: [facility];  Service: General;  Laterality: Left;  . Portacath placement N/A 07/02/2014    Procedure: INSERTION PORT-A-CATH;  Surgeon: Stark Klein, MD;  Location: [place];  Service: General;  Laterality: N/A;  . Incision and drainage Left 07/02/2014    Procedure: Drainage left breast seroma;  Surgeon: Stark Klein, MD;  Location: [place];  Service: General;  Laterality: Left;  . Evacuation breast hematoma Left 07/10/2014    Procedure: EVACUATION OF LEFT BREAST HEMATOMA ;  Surgeon: Stark Klein, MD;  Location: [facility];  Service: General;  Laterality: Left;    FAMILY HISTORY Family History  Problem Relation Age of Onset  . Breast cancer Mother 44    currently 72; TAH/BSO d/t ?cervical ca at 98  . Breast cancer Maternal Aunt     dx 89s; currently in her late 75s  . Lung cancer Father     deceased 7   the patient's father died at the age of 52 from lung cancer. The patient's mother was diagnosed with breast cancer at the age of 47. She survives. The patient had 2 brothers, and 2 sisters. One brother may have a diagnosis of "stomach cancer"  GYNECOLOGIC HISTORY:  No LMP recorded. Patient has had an ablation. Menarche age 89 which is also when the patient first had a child. She is GX P3. She stopped having periods after laser ablation in 2005.  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 She has lived with her daughter [name] and her 4 children who are 54, 8, 5, and 4. 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 More recently the patient is living in a shelter. The patient's son [name]  is a fast food cook in [place] and the patient's daughter [name] works as a Scientist, water quality, also in [place]. The patient has 7 grandchildren. She is not a church attender    ADVANCED DIRECTIVES: Not in place; this was discussed with the patient on her first visit, 05/21/2014, and she indicated she plans to name her daughter [name] as her healthcare power of attorney   HEALTH MAINTENANCE: History  Substance Use Topics  . Smoking status: Current Every Day Smoker -- 0.25 packs/day    Types: Cigarettes  . Smokeless tobacco: Never Used  . Alcohol Use: No     Comment: Percoset,Vicodin, Oxycotin     Colonoscopy:  PAP:  Bone density:  Lipid panel:  Allergies  Allergen Reactions  . Norco [Hydrocodone-Acetaminophen] Itching and Nausea Only    Pt reported allergy.    Current Outpatient Prescriptions  Medication Sig Dispense Refill  . Alum & Mag Hydroxide-Simeth (MAGIC MOUTHWASH) SOLN Take 5 mLs by mouth 4 (four) times daily as needed  for mouth pain.    Marland Kitchen dexamethasone (DECADRON) 4 MG tablet TAKE 2 TABLETS DAILY ON THE DAY AFTER CHEMO THEN 2 TABLETS 2 TIMES DAILY FOR 2 DAYS WITH FOOD (Patient not taking: Reported on 11/03/2014) 30 tablet 0  . fluconazole (DIFLUCAN) 100 MG tablet Take 1 tablet (100 mg total) by mouth daily. (Patient not taking: Reported on 10/20/2014) 15 tablet 2  . ibuprofen (ADVIL,MOTRIN) 200 MG tablet Take 1 tablet (200 mg total) by mouth every 6 (six) hours as needed for moderate pain. (Patient not taking: Reported on 11/03/2014) 30 tablet 0  . lidocaine-prilocaine (EMLA) cream Apply 1 application topically as needed. 30 g 0  . LORazepam (ATIVAN) 0.5 MG tablet Take 1 tablet (0.5 mg total) by mouth every 6 (six) hours as needed (Nausea or vomiting). 30 tablet 0  . nicotine (NICODERM CQ) 21 mg/24hr patch Place 1 patch (21 mg total) onto the skin daily. (Patient not taking: Reported on 09/17/2014) 28 patch 1  . omeprazole (PRILOSEC) 40 MG capsule Take 1 capsule (40 mg total) by  mouth at bedtime. (Patient not taking: Reported on 11/03/2014) 30 capsule 6  . ondansetron (ZOFRAN ODT) 8 MG disintegrating tablet Take 1 tablet (8 mg total) by mouth every 8 (eight) hours as needed for nausea or vomiting. 20 tablet 0  . Oxycodone HCl 20 MG TABS Take 1 tablet (20 mg total) by mouth every 4 (four) hours as needed (pain). 15 tablet 0  . oxyCODONE-acetaminophen (PERCOCET/ROXICET) 5-325 MG per tablet Take 1 tablet by mouth every 8 (eight) hours as needed for severe pain. 30 tablet 0  . ranitidine (ZANTAC) 150 MG tablet Take 1 tablet (150 mg total) by mouth 2 (two) times daily. (Patient not taking: Reported on 11/03/2014) 60 tablet 0   No current facility-administered medications for this visit.   Facility-Administered Medications Ordered in Other Visits  Medication Dose Route Frequency Provider Last Rate Last Dose  . sodium chloride 0.9 % injection 10 mL  10 mL Intracatheter PRN Chauncey Cruel, MD   10 mL at 07/29/14 1704    OBJECTIVE: Middle-aged [place] American woman who appears stated age 41 Vitals:   11/10/14 1425  BP: 128/78  Pulse: 94  Temp: 98.7 F (37.1 C)  Resp: 18     Body mass index is 25.67 kg/(m^2).    ECOG FS:1 - Symptomatic but completely ambulatory  Sclerae unicteric, pupils equal and reactive Oropharynx clear and moist-- no thrush No cervical or supraclavicular adenopathy Lungs no rales or rhonchi Heart regular rate and rhythm Abd soft, nontender, positive bowel sounds MSK no focal spinal tenderness, no upper extremity lymphedema Neuro: nonfocal, well oriented, appropriate affect Breasts: Left breast is swollen and under lumpectomy site. Tender to touch. Mild erythema, but no drainage.  LAB RESULTS:  CMP     Component Value Date/Time   NA 138 10/27/2014 1403   NA 136* 09/17/2014 0700   K 3.8 10/27/2014 1403   K 4.0 09/17/2014 0700   CL 99 09/17/2014 0700   CO2 30* 10/27/2014 1403   CO2 26 09/17/2014 0700   GLUCOSE 201* 10/27/2014 1403    GLUCOSE 359* 09/17/2014 0700   BUN 14.7 10/27/2014 1403   BUN 11 09/17/2014 0700   CREATININE 0.7 10/27/2014 1403   CREATININE 0.42* 09/17/2014 0700   CALCIUM 9.2 10/27/2014 1403   CALCIUM 9.2 09/17/2014 0700   PROT 6.9 10/27/2014 1403   PROT 6.5 09/17/2014 0700   ALBUMIN 3.8 10/27/2014 1403   ALBUMIN 3.2* 09/17/2014 0700  AST 15 10/27/2014 1403   AST 11 09/17/2014 0700   ALT 24 10/27/2014 1403   ALT 20 09/17/2014 0700   ALKPHOS 78 10/27/2014 1403   ALKPHOS 131* 09/17/2014 0700   BILITOT 0.22 10/27/2014 1403   BILITOT <0.2* 09/17/2014 0700   GFRNONAA >90 09/17/2014 0700   GFRAA >90 09/17/2014 0700    I No results found for: SPEP  Lab Results  Component Value Date   WBC 6.6 11/10/2014   NEUTROABS 4.7 11/10/2014   HGB 10.4* 11/10/2014   HCT 32.9* 11/10/2014   MCV 90.3 11/10/2014   PLT 294 11/10/2014      Chemistry      Component Value Date/Time   NA 138 10/27/2014 1403   NA 136* 09/17/2014 0700   K 3.8 10/27/2014 1403   K 4.0 09/17/2014 0700   CL 99 09/17/2014 0700   CO2 30* 10/27/2014 1403   CO2 26 09/17/2014 0700   BUN 14.7 10/27/2014 1403   BUN 11 09/17/2014 0700   CREATININE 0.7 10/27/2014 1403   CREATININE 0.42* 09/17/2014 0700      Component Value Date/Time   CALCIUM 9.2 10/27/2014 1403   CALCIUM 9.2 09/17/2014 0700   ALKPHOS 78 10/27/2014 1403   ALKPHOS 131* 09/17/2014 0700   AST 15 10/27/2014 1403   AST 11 09/17/2014 0700   ALT 24 10/27/2014 1403   ALT 20 09/17/2014 0700   BILITOT 0.22 10/27/2014 1403   BILITOT <0.2* 09/17/2014 0700       No results found for: LABCA2  No components found for: PXTGG269  No results for input(s): INR in the last 168 hours.  Urinalysis    Component Value Date/Time   COLORURINE YELLOW 06/03/2014 1050   APPEARANCEUR CLEAR 06/03/2014 1050   LABSPEC >1.030* 06/03/2014 1050   PHURINE 6.0 06/03/2014 1050   GLUCOSEU NEGATIVE 06/03/2014 1050   HGBUR SMALL* 06/03/2014 1050   BILIRUBINUR SMALL* 06/03/2014 1050    KETONESUR NEGATIVE 06/03/2014 1050   PROTEINUR NEGATIVE 06/03/2014 1050   UROBILINOGEN 0.2 06/03/2014 1050   NITRITE NEGATIVE 06/03/2014 1050   LEUKOCYTESUR TRACE* 06/03/2014 1050    STUDIES: Mm Digital Diagnostic Unilat L  10/28/2014   CLINICAL DATA:  Patient has swelling and pain from her left breast. She underwent lumpectomy for left breast carcinoma in August 2015 and is currently undergoing chemotherapy. She reports that she is had swelling from this breast well as pain since the surgery. She also reports that aspiration has been previously attempted.  EXAM: DIGITAL DIAGNOSTIC  LEFT MAMMOGRAM WITH CAD  ULTRASOUND LEFT BREAST  COMPARISON:  Prior exams  ACR Breast Density Category b: There are scattered areas of fibroglandular density.  FINDINGS: There is diffuse skin thickening as well as thickening of the suspensory ligaments throughout the left breast. Hazy opacity is seen throughout the lumpectomy site. No discrete masses seen mammographically. The most recent prior mammogram dated 05/09/2014 showed a post biopsy clip a more discrete mass. The area diffuse increased density is new since the prior study, but there are no other postlumpectomy mammograms.  Mammographic images were processed with CAD.  On physical exam, patient's breasts is swollen with an area of masslike fullness deep to the upper outer quadrant skin incision site.  Ultrasound is performed, showing a large fluid collection deep to the surgical incision in the upper outer quadrant. This is centered at 2 o'clock, 7 cm the nipple. It measures approximately 5.7 cm x 2.1 cm x 4.4 cm. This is a simple fluid collection with  no internal blood flow or septations. There are no sonographic solid masses to suggest residual or recurrent breast carcinoma. No axillary masses or adenopathy is seen.  IMPRESSION: Benign, presumed postoperative fluid collection deep to the left breast upper outer quadrant skin incision, measuring 5.7 cm in greatest  dimension. This could potentially be an infected collection given the patient's symptoms and the evidence of diffuse breast edema. Alternatively, the diffuse breast edema may be related to chemotherapy.  No evidence of residual or new breast malignancy.  RECOMMENDATION: Surgical consultation for either aspiration or incision and drainage. Patient was offered an appointment with the surgeon's office today 2:30pm, but did not want to go to that appointment. Patient will be contacted by the surgeons office for a future appointment.  I have discussed the findings and recommendations with the patient. Results were also provided in writing at the conclusion of the visit. If applicable, a reminder letter will be sent to the patient regarding the next appointment.  BI-RADS CATEGORY  2: Benign.   Electronically Signed   By: Lajean Manes M.D.   On: 10/28/2014 14:30   US Breast Stafford Axilla  10/28/2014   CLINICAL DATA:  Patient has swelling and pain from her left breast. She underwent lumpectomy for left breast carcinoma in August 2015 and is currently undergoing chemotherapy. She reports that she is had swelling from this breast well as pain since the surgery. She also reports that aspiration has been previously attempted.  EXAM: DIGITAL DIAGNOSTIC  LEFT MAMMOGRAM WITH CAD  ULTRASOUND LEFT BREAST  COMPARISON:  Prior exams  ACR Breast Density Category b: There are scattered areas of fibroglandular density.  FINDINGS: There is diffuse skin thickening as well as thickening of the suspensory ligaments throughout the left breast. Hazy opacity is seen throughout the lumpectomy site. No discrete masses seen mammographically. The most recent prior mammogram dated 05/09/2014 showed a post biopsy clip a more discrete mass. The area diffuse increased density is new since the prior study, but there are no other postlumpectomy mammograms.  Mammographic images were processed with CAD.  On physical exam, patient's breasts is  swollen with an area of masslike fullness deep to the upper outer quadrant skin incision site.  Ultrasound is performed, showing a large fluid collection deep to the surgical incision in the upper outer quadrant. This is centered at 2 o'clock, 7 cm the nipple. It measures approximately 5.7 cm x 2.1 cm x 4.4 cm. This is a simple fluid collection with no internal blood flow or septations. There are no sonographic solid masses to suggest residual or recurrent breast carcinoma. No axillary masses or adenopathy is seen.  IMPRESSION: Benign, presumed postoperative fluid collection deep to the left breast upper outer quadrant skin incision, measuring 5.7 cm in greatest dimension. This could potentially be an infected collection given the patient's symptoms and the evidence of diffuse breast edema. Alternatively, the diffuse breast edema may be related to chemotherapy.  No evidence of residual or new breast malignancy.  RECOMMENDATION: Surgical consultation for either aspiration or incision and drainage. Patient was offered an appointment with the surgeon's office today 2:30pm, but did not want to go to that appointment. Patient will be contacted by the surgeons office for a future appointment.  I have discussed the findings and recommendations with the patient. Results were also provided in writing at the conclusion of the visit. If applicable, a reminder letter will be sent to the patient regarding the next appointment.  BI-RADS CATEGORY  2: Benign.   Electronically Signed   By: Lajean Manes M.D.   On: 10/28/2014 14:30     ASSESSMENT: 49 y.o. [place] woman status post left breast upper outer quadrant biopsy 05/09/2014 for a clinicalT1c N1, stage IIA invasive ductal carcinoma, grade 2, estrogen and progesterone receptor positive, HER-2 nonamplified, with an MIB-1 of 80%  (1) patient met with genetics counselor 05/29/2014 but decided against genetics testing   (2) tobacco abuse. The patient has been strongly  urged to discontinue smoking. She was prescribed a nicotine patch 09/02/2014  (3) status post left lumpectomy and axillary lymph node dissection 06/11/2014 for a pT1c pN3, stage IIIC invasive ductal carcinoma, grade 3, with repeat HER-2 negative  (4) adjuvant chemotherapy to consist of doxorubicin and cyclophosphamide in dose dense fashion x4, with Neulasta support, started 07/29/2014, to be followed by paclitaxel weekly x12  (5) adjuvant radiation to follow chemotherapy  (6) anti-estrogens for 5-10 years to follow radiation  PLAN: Case discussed with Dr. [name]. We have recommended To proceed with her chemotherapy today. This will be cycle 4 of 12 weekly cycles of Taxol. The labs were reviewed in detail and was entirely stable. She is having more breast pain due to a large fluid accumulation. There is a question of possible infection on the mammogram. I have explained to the patient that this fluid collection needs to be evaluated and that I am concerned giving her more chemo today could increase her risk for infection. We have been unsuccessful in obtaining a referral from her PCP to the surgeon. She does not have an appointment with them until February.   I have refilled her Percocet today for her left breast pain. If she develops worsening pain, fever, redness, and any other concerning symptoms to her left breast she is to call our office or be seen in the ER for further management.  I will plan to see her back next week for cycle 5 of her chemotherapy.  She understands and agrees with this plan. She knows the goal of treatment in her case is cure. She has been encouraged to call with any issues that might arise before her next visit here.    Mikey Bussing, NP 11/10/2014

## 2014-11-10 NOTE — Progress Notes (Signed)
Patient states per NP and MD at today's clinic visit she will go to the ED this week to have left breast abcess evaluated and treated. RN reviewed chemo alert card, infection precautions, and when to call [facility] or present to ED. Patient verbalizes understanding of instructions.

## 2014-11-11 ENCOUNTER — Telehealth: Payer: Self-pay | Admitting: Oncology

## 2014-11-12 ENCOUNTER — Encounter (HOSPITAL_COMMUNITY): Payer: Self-pay | Admitting: Emergency Medicine

## 2014-11-12 ENCOUNTER — Ambulatory Visit: Admit: 2014-11-12 | Payer: Self-pay | Admitting: Surgery

## 2014-11-12 ENCOUNTER — Inpatient Hospital Stay (HOSPITAL_COMMUNITY)
Admission: EM | Admit: 2014-11-12 | Discharge: 2014-11-16 | DRG: 585 | Disposition: A | Discharge status: Home Health | Payer: Medicaid Other | Attending: Surgery | Admitting: Surgery

## 2014-11-12 ENCOUNTER — Observation Stay (HOSPITAL_COMMUNITY): Payer: Medicaid Other | Admitting: Registered Nurse

## 2014-11-12 ENCOUNTER — Encounter (HOSPITAL_COMMUNITY): Admission: EM | Disposition: A | Discharge status: Home Health | Payer: Self-pay | Source: Home / Self Care

## 2014-11-12 DIAGNOSIS — M1990 Unspecified osteoarthritis, unspecified site: Secondary | ICD-10-CM | POA: Diagnosis present

## 2014-11-12 DIAGNOSIS — N644 Mastodynia: Secondary | ICD-10-CM | POA: Diagnosis present

## 2014-11-12 DIAGNOSIS — F17210 Nicotine dependence, cigarettes, uncomplicated: Secondary | ICD-10-CM | POA: Diagnosis present

## 2014-11-12 DIAGNOSIS — C50412 Malignant neoplasm of upper-outer quadrant of left female breast: Secondary | ICD-10-CM | POA: Diagnosis present

## 2014-11-12 DIAGNOSIS — N6489 Other specified disorders of breast: Secondary | ICD-10-CM | POA: Diagnosis present

## 2014-11-12 DIAGNOSIS — D509 Iron deficiency anemia, unspecified: Secondary | ICD-10-CM | POA: Diagnosis present

## 2014-11-12 DIAGNOSIS — E1165 Type 2 diabetes mellitus with hyperglycemia: Secondary | ICD-10-CM | POA: Diagnosis present

## 2014-11-12 DIAGNOSIS — I890 Lymphedema, not elsewhere classified: Secondary | ICD-10-CM

## 2014-11-12 DIAGNOSIS — N61 Inflammatory disorders of breast: Principal | ICD-10-CM | POA: Diagnosis present

## 2014-11-12 DIAGNOSIS — F329 Major depressive disorder, single episode, unspecified: Secondary | ICD-10-CM | POA: Diagnosis present

## 2014-11-12 DIAGNOSIS — Z885 Allergy status to narcotic agent status: Secondary | ICD-10-CM

## 2014-11-12 DIAGNOSIS — E119 Type 2 diabetes mellitus without complications: Secondary | ICD-10-CM

## 2014-11-12 DIAGNOSIS — Z590 Homelessness: Secondary | ICD-10-CM

## 2014-11-12 DIAGNOSIS — Z803 Family history of malignant neoplasm of breast: Secondary | ICD-10-CM

## 2014-11-12 DIAGNOSIS — R51 Headache: Secondary | ICD-10-CM | POA: Diagnosis present

## 2014-11-12 DIAGNOSIS — N611 Abscess of the breast and nipple: Secondary | ICD-10-CM | POA: Diagnosis present

## 2014-11-12 DIAGNOSIS — Z79899 Other long term (current) drug therapy: Secondary | ICD-10-CM

## 2014-11-12 DIAGNOSIS — Y838 Other surgical procedures as the cause of abnormal reaction of the patient, or of later complication, without mention of misadventure at the time of the procedure: Secondary | ICD-10-CM | POA: Diagnosis present

## 2014-11-12 HISTORY — DX: Type 2 diabetes mellitus without complications: E11.9

## 2014-11-12 HISTORY — PX: INCISION AND DRAINAGE ABSCESS: SHX5864

## 2014-11-12 LAB — CBC WITH DIFFERENTIAL/PLATELET
BASOS PCT: 0 % (ref 0–1)
Basophils Absolute: 0 10*3/uL (ref 0.0–0.1)
EOS ABS: 0 10*3/uL (ref 0.0–0.7)
EOS PCT: 0 % (ref 0–5)
HCT: 33.2 % — ABNORMAL LOW (ref 36.0–46.0)
Hemoglobin: 10.8 g/dL — ABNORMAL LOW (ref 12.0–15.0)
LYMPHS PCT: 8 % — AB (ref 12–46)
Lymphs Abs: 0.9 10*3/uL (ref 0.7–4.0)
MCH: 29.1 pg (ref 26.0–34.0)
MCHC: 32.5 g/dL (ref 30.0–36.0)
MCV: 89.5 fL (ref 78.0–100.0)
Monocytes Absolute: 0.3 10*3/uL (ref 0.1–1.0)
Monocytes Relative: 2 % — ABNORMAL LOW (ref 3–12)
NEUTROS ABS: 10 10*3/uL — AB (ref 1.7–7.7)
Neutrophils Relative %: 90 % — ABNORMAL HIGH (ref 43–77)
PLATELETS: 313 10*3/uL (ref 150–400)
RBC: 3.71 MIL/uL — ABNORMAL LOW (ref 3.87–5.11)
RDW: 17.2 % — AB (ref 11.5–15.5)
WBC: 11.1 10*3/uL — AB (ref 4.0–10.5)

## 2014-11-12 LAB — I-STAT CHEM 8, ED
BUN: 16 mg/dL (ref 6–23)
Calcium, Ion: 1.22 mmol/L (ref 1.12–1.23)
Chloride: 100 mEq/L (ref 96–112)
Creatinine, Ser: 0.5 mg/dL (ref 0.50–1.10)
Glucose, Bld: 404 mg/dL — ABNORMAL HIGH (ref 70–99)
HCT: 37 % (ref 36.0–46.0)
Hemoglobin: 12.6 g/dL (ref 12.0–15.0)
POTASSIUM: 4.5 mmol/L (ref 3.5–5.1)
Sodium: 136 mmol/L (ref 135–145)
TCO2: 23 mmol/L (ref 0–100)

## 2014-11-12 LAB — GLUCOSE, CAPILLARY
GLUCOSE-CAPILLARY: 214 mg/dL — AB (ref 70–99)
Glucose-Capillary: 162 mg/dL — ABNORMAL HIGH (ref 70–99)

## 2014-11-12 LAB — I-STAT CG4 LACTIC ACID, ED: Lactic Acid, Venous: 2.55 mmol/L — ABNORMAL HIGH (ref 0.5–2.2)

## 2014-11-12 LAB — CBG MONITORING, ED: GLUCOSE-CAPILLARY: 272 mg/dL — AB (ref 70–99)

## 2014-11-12 SURGERY — INCISION AND DRAINAGE, ABSCESS
Anesthesia: General | Site: Breast

## 2014-11-12 MED ORDER — FENTANYL CITRATE 0.05 MG/ML IJ SOLN
INTRAMUSCULAR | Status: AC
  Filled 2014-11-12: qty 2

## 2014-11-12 MED ORDER — CEFAZOLIN SODIUM-DEXTROSE 2-3 GM-% IV SOLR
2.0000 g | INTRAVENOUS | Status: AC
  Filled 2014-11-12: qty 50

## 2014-11-12 MED ORDER — MIDAZOLAM HCL 2 MG/2ML IJ SOLN
INTRAMUSCULAR | Status: AC
  Filled 2014-11-12: qty 2

## 2014-11-12 MED ORDER — SODIUM CHLORIDE 0.9 % IV BOLUS (SEPSIS)
1000.0000 mL | Freq: Once | INTRAVENOUS | Status: AC
  Administered 2014-11-12: 1000 mL via INTRAVENOUS

## 2014-11-12 MED ORDER — CEFAZOLIN SODIUM-DEXTROSE 2-3 GM-% IV SOLR
INTRAVENOUS | Status: AC
  Filled 2014-11-12: qty 50

## 2014-11-12 MED ORDER — SUCCINYLCHOLINE CHLORIDE 20 MG/ML IJ SOLN
INTRAMUSCULAR | Status: DC | PRN
  Administered 2014-11-12: 100 mg via INTRAVENOUS

## 2014-11-12 MED ORDER — MENTHOL 3 MG MT LOZG: 1.0000 | LOZENGE | OROMUCOSAL | Status: DC | PRN

## 2014-11-12 MED ORDER — DIPHENHYDRAMINE HCL 50 MG/ML IJ SOLN: 12.5000 mg | Freq: Four times a day (QID) | INTRAMUSCULAR | Status: DC | PRN

## 2014-11-12 MED ORDER — KCL IN DEXTROSE-NACL 40-5-0.45 MEQ/L-%-% IV SOLN
INTRAVENOUS | Status: DC
  Administered 2014-11-12 – 2014-11-14 (×4): via INTRAVENOUS
  Filled 2014-11-12 (×6): qty 1000

## 2014-11-12 MED ORDER — TRAMADOL HCL 50 MG PO TABS
50.0000 mg | ORAL_TABLET | Freq: Four times a day (QID) | ORAL | Status: DC | PRN
  Administered 2014-11-13 – 2014-11-16 (×6): 100 mg via ORAL
  Filled 2014-11-12 (×6): qty 2

## 2014-11-12 MED ORDER — ONDANSETRON HCL 4 MG/2ML IJ SOLN
INTRAMUSCULAR | Status: DC | PRN
  Administered 2014-11-12: 4 mg via INTRAVENOUS

## 2014-11-12 MED ORDER — CEFAZOLIN SODIUM-DEXTROSE 2-3 GM-% IV SOLR
2.0000 g | INTRAVENOUS | Status: DC
Start: 2014-11-13 — End: 2014-11-12

## 2014-11-12 MED ORDER — PANTOPRAZOLE SODIUM 40 MG PO TBEC
40.0000 mg | DELAYED_RELEASE_TABLET | Freq: Every day | ORAL | Status: DC
  Administered 2014-11-13 – 2014-11-16 (×4): 40 mg via ORAL
  Filled 2014-11-12 (×4): qty 1

## 2014-11-12 MED ORDER — CEFAZOLIN SODIUM-DEXTROSE 2-3 GM-% IV SOLR
2.0000 g | Freq: Three times a day (TID) | INTRAVENOUS | Status: DC
  Administered 2014-11-12 – 2014-11-14 (×6): 2 g via INTRAVENOUS
  Filled 2014-11-12 (×6): qty 50

## 2014-11-12 MED ORDER — ACETAMINOPHEN 10 MG/ML IV SOLN
1000.0000 mg | Freq: Once | INTRAVENOUS | Status: AC
  Administered 2014-11-12: 1000 mg via INTRAVENOUS
  Filled 2014-11-12: qty 100

## 2014-11-12 MED ORDER — INSULIN ASPART 100 UNIT/ML ~~LOC~~ SOLN
SUBCUTANEOUS | Status: AC
  Filled 2014-11-12: qty 1

## 2014-11-12 MED ORDER — LACTATED RINGERS IV SOLN
INTRAVENOUS | Status: DC
  Administered 2014-11-12 (×2): via INTRAVENOUS

## 2014-11-12 MED ORDER — POLYETHYLENE GLYCOL 3350 17 G PO PACK: 17.0000 g | PACK | Freq: Two times a day (BID) | ORAL | Status: DC | PRN

## 2014-11-12 MED ORDER — ACETAMINOPHEN 500 MG PO TABS
1000.0000 mg | ORAL_TABLET | Freq: Three times a day (TID) | ORAL | Status: DC
  Administered 2014-11-12 – 2014-11-16 (×11): 1000 mg via ORAL
  Filled 2014-11-12 (×14): qty 2

## 2014-11-12 MED ORDER — ONDANSETRON HCL 4 MG/2ML IJ SOLN: 4.0000 mg | Freq: Four times a day (QID) | INTRAMUSCULAR | Status: DC | PRN

## 2014-11-12 MED ORDER — ONDANSETRON HCL 4 MG/2ML IJ SOLN
4.0000 mg | Freq: Once | INTRAMUSCULAR | Status: AC
  Administered 2014-11-12: 4 mg via INTRAVENOUS
  Filled 2014-11-12: qty 2

## 2014-11-12 MED ORDER — INSULIN ASPART 100 UNIT/ML ~~LOC~~ SOLN
0.0000 [IU] | Freq: Three times a day (TID) | SUBCUTANEOUS | Status: DC
  Administered 2014-11-13 (×2): 7 [IU] via SUBCUTANEOUS
  Administered 2014-11-13: 4 [IU] via SUBCUTANEOUS
  Administered 2014-11-14: 7 [IU] via SUBCUTANEOUS
  Administered 2014-11-14 (×2): 4 [IU] via SUBCUTANEOUS
  Administered 2014-11-15: 3 [IU] via SUBCUTANEOUS
  Administered 2014-11-15: 4 [IU] via SUBCUTANEOUS
  Administered 2014-11-15: 3 [IU] via SUBCUTANEOUS
  Administered 2014-11-16: 4 [IU] via SUBCUTANEOUS

## 2014-11-12 MED ORDER — LACTATED RINGERS IV BOLUS (SEPSIS): 1000.0000 mL | Freq: Three times a day (TID) | INTRAVENOUS | Status: AC | PRN

## 2014-11-12 MED ORDER — LIP MEDEX EX OINT
1.0000 "application " | TOPICAL_OINTMENT | Freq: Two times a day (BID) | CUTANEOUS | Status: DC
  Administered 2014-11-13 – 2014-11-16 (×7): 1 via TOPICAL
  Filled 2014-11-12: qty 7

## 2014-11-12 MED ORDER — ESMOLOL HCL 10 MG/ML IV SOLN
INTRAVENOUS | Status: DC | PRN
  Administered 2014-11-12 (×2): 10 mg via INTRAVENOUS

## 2014-11-12 MED ORDER — ONDANSETRON HCL 4 MG/2ML IJ SOLN
INTRAMUSCULAR | Status: AC
  Filled 2014-11-12: qty 2

## 2014-11-12 MED ORDER — NICOTINE 21 MG/24HR TD PT24: 21.0000 mg | MEDICATED_PATCH | Freq: Every day | TRANSDERMAL | Status: DC

## 2014-11-12 MED ORDER — PROPOFOL 10 MG/ML IV BOLUS
INTRAVENOUS | Status: AC
  Filled 2014-11-12: qty 20

## 2014-11-12 MED ORDER — PROMETHAZINE HCL 25 MG/ML IJ SOLN: 6.2500 mg | INTRAMUSCULAR | Status: DC | PRN

## 2014-11-12 MED ORDER — FENTANYL CITRATE 0.05 MG/ML IJ SOLN
INTRAMUSCULAR | Status: AC
  Filled 2014-11-12: qty 5

## 2014-11-12 MED ORDER — PHENOL 1.4 % MT LIQD: 2.0000 | OROMUCOSAL | Status: DC | PRN

## 2014-11-12 MED ORDER — LACTATED RINGERS IV SOLN: INTRAVENOUS | Status: DC | PRN

## 2014-11-12 MED ORDER — MIDAZOLAM HCL 5 MG/5ML IJ SOLN
INTRAMUSCULAR | Status: DC | PRN
  Administered 2014-11-12 (×2): 1 mg via INTRAVENOUS

## 2014-11-12 MED ORDER — HYDROMORPHONE HCL 1 MG/ML IJ SOLN
0.5000 mg | INTRAMUSCULAR | Status: DC | PRN
  Administered 2014-11-12 – 2014-11-13 (×3): 1 mg via INTRAVENOUS
  Administered 2014-11-13 (×2): 2 mg via INTRAVENOUS
  Administered 2014-11-14 (×2): 1 mg via INTRAVENOUS
  Administered 2014-11-15 – 2014-11-16 (×5): 2 mg via INTRAVENOUS
  Filled 2014-11-12: qty 1
  Filled 2014-11-12 (×2): qty 2
  Filled 2014-11-12: qty 1
  Filled 2014-11-12 (×2): qty 2
  Filled 2014-11-12: qty 1
  Filled 2014-11-12 (×2): qty 2
  Filled 2014-11-12: qty 1
  Filled 2014-11-12 (×2): qty 2

## 2014-11-12 MED ORDER — MAGIC MOUTHWASH
15.0000 mL | Freq: Four times a day (QID) | ORAL | Status: DC | PRN
  Filled 2014-11-12: qty 15

## 2014-11-12 MED ORDER — LIDOCAINE HCL (CARDIAC) 20 MG/ML IV SOLN
INTRAVENOUS | Status: AC
  Filled 2014-11-12: qty 5

## 2014-11-12 MED ORDER — LORAZEPAM 0.5 MG PO TABS: 0.5000 mg | ORAL_TABLET | Freq: Four times a day (QID) | ORAL | Status: DC | PRN

## 2014-11-12 MED ORDER — MORPHINE SULFATE 4 MG/ML IJ SOLN
6.0000 mg | Freq: Once | INTRAMUSCULAR | Status: AC
  Administered 2014-11-12: 6 mg via INTRAVENOUS
  Filled 2014-11-12: qty 2

## 2014-11-12 MED ORDER — DIPHENHYDRAMINE HCL 12.5 MG/5ML PO ELIX: 12.5000 mg | ORAL_SOLUTION | Freq: Four times a day (QID) | ORAL | Status: DC | PRN

## 2014-11-12 MED ORDER — HEPARIN SODIUM (PORCINE) 5000 UNIT/ML IJ SOLN
5000.0000 [IU] | Freq: Three times a day (TID) | INTRAMUSCULAR | Status: DC
  Administered 2014-11-13 – 2014-11-16 (×10): 5000 [IU] via SUBCUTANEOUS
  Filled 2014-11-12 (×13): qty 1

## 2014-11-12 MED ORDER — INSULIN ASPART 100 UNIT/ML ~~LOC~~ SOLN
0.0000 [IU] | Freq: Every day | SUBCUTANEOUS | Status: DC
  Administered 2014-11-13 – 2014-11-14 (×2): 2 [IU] via SUBCUTANEOUS

## 2014-11-12 MED ORDER — BUPIVACAINE-EPINEPHRINE 0.25% -1:200000 IJ SOLN
INTRAMUSCULAR | Status: AC
  Filled 2014-11-12: qty 1

## 2014-11-12 MED ORDER — PROPOFOL 10 MG/ML IV BOLUS
INTRAVENOUS | Status: DC | PRN
  Administered 2014-11-12: 50 mg via INTRAVENOUS
  Administered 2014-11-12: 150 mg via INTRAVENOUS
  Administered 2014-11-12: 100 mg via INTRAVENOUS

## 2014-11-12 MED ORDER — INSULIN ASPART 100 UNIT/ML ~~LOC~~ SOLN
5.0000 [IU] | Freq: Once | SUBCUTANEOUS | Status: AC
  Administered 2014-11-12: 5 [IU] via SUBCUTANEOUS

## 2014-11-12 MED ORDER — LACTATED RINGERS IV SOLN: INTRAVENOUS | Status: DC

## 2014-11-12 MED ORDER — MEPERIDINE HCL 50 MG/ML IJ SOLN: 6.2500 mg | INTRAMUSCULAR | Status: DC | PRN

## 2014-11-12 MED ORDER — BISMUTH SUBSALICYLATE 262 MG/15ML PO SUSP: 30.0000 mL | Freq: Four times a day (QID) | ORAL | Status: DC | PRN

## 2014-11-12 MED ORDER — BUPIVACAINE-EPINEPHRINE 0.25% -1:200000 IJ SOLN
INTRAMUSCULAR | Status: DC | PRN
  Administered 2014-11-12: 50 mL

## 2014-11-12 MED ORDER — LIDOCAINE-PRILOCAINE 2.5-2.5 % EX CREA: 1.0000 "application " | TOPICAL_CREAM | CUTANEOUS | Status: DC | PRN

## 2014-11-12 MED ORDER — FAMOTIDINE 20 MG PO TABS
20.0000 mg | ORAL_TABLET | Freq: Two times a day (BID) | ORAL | Status: DC
  Administered 2014-11-13 – 2014-11-16 (×7): 20 mg via ORAL
  Filled 2014-11-12 (×8): qty 1

## 2014-11-12 MED ORDER — CLINDAMYCIN PHOSPHATE 600 MG/50ML IV SOLN
600.0000 mg | Freq: Once | INTRAVENOUS | Status: AC
  Administered 2014-11-12: 600 mg via INTRAVENOUS
  Filled 2014-11-12: qty 50

## 2014-11-12 MED ORDER — FLUCONAZOLE 100 MG PO TABS: 100.0000 mg | ORAL_TABLET | Freq: Every day | ORAL | Status: DC

## 2014-11-12 MED ORDER — LIDOCAINE HCL (CARDIAC) 20 MG/ML IV SOLN
INTRAVENOUS | Status: DC | PRN
  Administered 2014-11-12: 75 mg via INTRAVENOUS

## 2014-11-12 MED ORDER — DEXAMETHASONE SODIUM PHOSPHATE 10 MG/ML IJ SOLN
INTRAMUSCULAR | Status: AC
  Filled 2014-11-12: qty 1

## 2014-11-12 MED ORDER — BISACODYL 10 MG RE SUPP: 10.0000 mg | Freq: Two times a day (BID) | RECTAL | Status: DC | PRN

## 2014-11-12 MED ORDER — FENTANYL CITRATE 0.05 MG/ML IJ SOLN
25.0000 ug | INTRAMUSCULAR | Status: DC | PRN
  Administered 2014-11-12 (×3): 50 ug via INTRAVENOUS

## 2014-11-12 MED ORDER — FENTANYL CITRATE 0.05 MG/ML IJ SOLN
INTRAMUSCULAR | Status: DC | PRN
  Administered 2014-11-12: 50 ug via INTRAVENOUS
  Administered 2014-11-12 (×2): 100 ug via INTRAVENOUS

## 2014-11-12 MED ORDER — ALUM & MAG HYDROXIDE-SIMETH 200-200-20 MG/5ML PO SUSP
30.0000 mL | Freq: Four times a day (QID) | ORAL | Status: DC | PRN
  Filled 2014-11-12: qty 30

## 2014-11-12 SURGICAL SUPPLY — 35 items
BINDER BREAST XLRG (GAUZE/BANDAGES/DRESSINGS) ×2 IMPLANT
BLADE SURG 15 STRL LF DISP TIS (BLADE) ×1 IMPLANT
BLADE SURG 15 STRL SS (BLADE)
BNDG GAUZE ELAST 4 BULKY (GAUZE/BANDAGES/DRESSINGS) ×1 IMPLANT
BRIEF STRETCH FOR OB PAD LRG (UNDERPADS AND DIAPERS) ×1 IMPLANT
DECANTER SPIKE VIAL GLASS SM (MISCELLANEOUS) IMPLANT
DRAPE LAPAROSCOPIC ABDOMINAL (DRAPES) IMPLANT
DRSG PAD ABDOMINAL 8X10 ST (GAUZE/BANDAGES/DRESSINGS) ×1 IMPLANT
ELECT REM PT RETURN 9FT ADLT (ELECTROSURGICAL) ×3
ELECTRODE REM PT RTRN 9FT ADLT (ELECTROSURGICAL) ×1 IMPLANT
GAUZE IODOFORM PACK 1/2 7832 (GAUZE/BANDAGES/DRESSINGS) ×2 IMPLANT
GAUZE SPONGE 4X4 12PLY STRL (GAUZE/BANDAGES/DRESSINGS) ×2 IMPLANT
GLOVE ECLIPSE 8.0 STRL XLNG CF (GLOVE) ×3 IMPLANT
GLOVE INDICATOR 8.0 STRL GRN (GLOVE) ×3 IMPLANT
GOWN STRL REUS W/TWL XL LVL3 (GOWN DISPOSABLE) ×6 IMPLANT
IV SET HUBERPLUS 22X1 SAFETY (NEEDLE) ×1 IMPLANT
KIT BASIN OR (CUSTOM PROCEDURE TRAY) ×3 IMPLANT
NS IRRIG 1000ML POUR BTL (IV SOLUTION) ×3 IMPLANT
PACK BASIC VI WITH GOWN DISP (CUSTOM PROCEDURE TRAY) ×1 IMPLANT
PACK GENERAL/GYN (CUSTOM PROCEDURE TRAY) ×2 IMPLANT
PAD ABD 8X10 STRL (GAUZE/BANDAGES/DRESSINGS) ×2 IMPLANT
PENCIL BUTTON HOLSTER BLD 10FT (ELECTRODE) ×1 IMPLANT
SPONGE LAP 18X18 X RAY DECT (DISPOSABLE) ×1 IMPLANT
SUT MNCRL AB 4-0 PS2 18 (SUTURE) IMPLANT
SUT VIC AB 2-0 SH 27 (SUTURE)
SUT VIC AB 2-0 SH 27X BRD (SUTURE) IMPLANT
SUT VIC AB 3-0 SH 27 (SUTURE)
SUT VIC AB 3-0 SH 27XBRD (SUTURE) IMPLANT
SWAB COLLECTION DEVICE MRSA (MISCELLANEOUS) IMPLANT
SYR 20CC LL (SYRINGE) ×3 IMPLANT
SYR BULB IRRIGATION 50ML (SYRINGE) ×3 IMPLANT
TOWEL OR 17X26 10 PK STRL BLUE (TOWEL DISPOSABLE) ×3 IMPLANT
TOWEL OR NON WOVEN STRL DISP B (DISPOSABLE) ×3 IMPLANT
TUBE ANAEROBIC SPECIMEN COL (MISCELLANEOUS) IMPLANT
YANKAUER SUCT BULB TIP NO VENT (SUCTIONS) ×1 IMPLANT

## 2014-11-12 NOTE — Discharge Instructions (Signed)
WOUND CARE  It is important that the wound be kept open.   -Keeping the skin edges apart will allow the wound to gradually heal from the base upwards.   - If the skin edges of the wound close too early, a new fluid pocket can form and infection can occur. -This is the reason to pack deeper wounds with gauze or ribbon -This is why drained wounds cannot be sewed closed right away  A healthy wound should form a lining of bright red "beefy" granulating tissue that will help shrink the wound and help the edges grow new skin into it.   -A little mucus / yellow discharge is normal (the body's natural way to try and form a scab) and should be gently washed off with soap and water with daily dressing changes.  -Green or foul smelling drainage implies bacterial colonization and can slow wound healing - a short course of antibiotic ointment (3-5 days) can help it clear up.  Call the doctor if it does not improve or worsens  -Avoid use of antibiotic ointments for more than a week as they can slow wound healing over time.    -Sometimes other wound care products will be used to reduce need for dressing changes and/or help clean up dirty wounds -Sometimes the surgeon needs to debride the wound in the office to remove dead or infected tissue out of the wound so it can heal more quickly and safely.    Change the dressing at least once a day -Wash the wound with mild soap and water gently every day.  It is good to shower or bathe the wound to help it clean out. -Use clean 4x4 gauze for medium/large wounds or ribbon plain NU-gauze for smaller wounds (it does not need to be sterile, just clean) -Keep the raw wound moist with a little saline or KY (saline) gel on the gauze.  -A dry wound will take longer to heal.  -Keep the skin dry around the wound to prevent breakdown and irritation. -Pack the wound down to the base -The goal is to keep the skin apart, not overpack the wound -Use a Q-tip or blunt-tipped kabob  stick toothpick to push the gauze down to the base in narrow or deep wounds   -Cover with a clean gauze and tape -paper or Medipore tape tend to be gentle on the skin -rotate the orientation of the tape to avoid repeated stress/trauma on the skin -using an ACE or Coban wrap on wounds on arms or legs can be used instead.  Complete all antibiotics through the entire prescription to help the infection heal and prevent new places of infection   Returning the see the surgeon is helpful to follow the healing process and help the wound close as fast as possible.  [place] Office Phone Number [phone]  BREAST BIOPSY/ PARTIAL MASTECTOMY: POST OP INSTRUCTIONS  Always review your discharge instruction sheet given to you by the facility where your surgery was performed.  IF YOU HAVE DISABILITY OR FAMILY LEAVE FORMS, YOU MUST BRING THEM TO THE OFFICE FOR PROCESSING.  DO NOT GIVE THEM TO YOUR DOCTOR.  1. A prescription for pain medication may be given to you upon discharge.  Take your pain medication as prescribed, if needed.  If narcotic pain medicine is not needed, then you may take acetaminophen (Tylenol) or ibuprofen (Advil) as needed. 2. Take your usually prescribed medications unless otherwise directed 3. If you need a refill on your pain medication,  please contact your pharmacy.  They will contact our office to request authorization.  Prescriptions will not be filled after 5pm or on week-ends. 4. You should eat very light the first 24 hours after surgery, such as soup, crackers, pudding, etc.  Resume your normal diet the day after surgery. 5. Most patients will experience some swelling and bruising in the breast.  Ice packs and a good support bra will help.  Swelling and bruising can take several days to resolve.  6. It is common to experience some constipation if taking pain medication after surgery.  Increasing fluid intake and taking a stool softener will usually help or  prevent this problem from occurring.  A mild laxative (Milk of Magnesia or Miralax) should be taken according to package directions if there are no bowel movements after 48 hours. 7. Unless discharge instructions indicate otherwise, you may remove your bandages 24-48 hours after surgery, and you may shower at that time.  You may have steri-strips (small skin tapes) in place directly over the incision.  These strips should be left on the skin for 7-10 days.  If your surgeon used skin glue on the incision, you may shower in 24 hours.  The glue will flake off over the next 2-3 weeks.  Any sutures or staples will be removed at the office during your follow-up visit. 8. ACTIVITIES:  You may resume regular daily activities (gradually increasing) beginning the next day.  Wearing a good support bra or sports bra minimizes pain and swelling.  You may have sexual intercourse when it is comfortable. a. You may drive when you no longer are taking prescription pain medication, you can comfortably wear a seatbelt, and you can safely maneuver your car and apply brakes. b. RETURN TO WORK:  ______________________________________________________________________________________ 9. You should see your doctor in the office for a follow-up appointment approximately two weeks after your surgery.  Your doctors nurse will typically make your follow-up appointment when she calls you with your pathology report.  Expect your pathology report 2-3 business days after your surgery.  You may call to check if you do not hear from [place] after three days. 10. OTHER INSTRUCTIONS: _______________________________________________________________________________________________  _____________________________________________________________________________________________________________________________________ _____________________________________________________________________________________________________________________________________ _____________________________________________________________________________________________________________________________________  WHEN TO CALL YOUR DOCTOR: 1. Fever over 101.0 2. Nausea and/or vomiting. 3. Extreme swelling or bruising. 4. Continued bleeding from incision. 5. Increased pain, redness, or drainage from the incision.  The clinic staff is available to answer your questions during regular business hours.  Please dont hesitate to call and ask to speak to one of the nurses for clinical concerns.  If you have a medical emergency, go to the nearest emergency room or call 911.  A surgeon from [facility] is always on call at the hospital.  For further questions, please visit [email or web address]   Exercises Following Breast Surgery Following all surgeries on the breast or axillary nodes, whether you had radiation treatment or not, exercises may help you return to your normal activities and way of life sooner. Before beginning any exercise, talk to your caregiver about what type of exercises will be best for you. Your caregiver may recommend getting physical therapy to help you, especially if you do not see any progress in a month of exercising. Some light exercises can be done right after the surgery, but any drains and sutures will be removed before doing the extended or heavy exercises. Generally, the exercises will lessen problems following the surgery. You can usually expect to have full range of motion  of your arm back in 4 to 6 weeks.  HOME CARE INSTRUCTIONS  These exercises should be done for the first 3 to 7 days after surgery, but only with your doctor's permission.   Use your affected arm (on  the side where your surgery was) as you normally would when combing your hair, bathing, dressing and eating.  Raise your affected arm above the level of your heart for 45 minutes, 2 to 3 times a day, while lying down. Put your arm on pillows so that your hand is higher than your wrist and your elbow is a little higher than your shoulder. This will help decrease the swelling that may happen after surgery.  Exercise your affected arm while it is elevated above the level of your heart by opening and closing your hand 15 to 25 times. Then, bend and straighten your elbow. Repeat this 3 to 4 times a day. This exercise helps reduce swelling by pumping lymph fluid out of your arm.  Practice deep breathing exercises (using your diaphragm) at least 6 times each day. While lying on your back, take a slow, deep breath. Breathe in as much air as you can while trying to expand your chest and abdomen (push your belly button away from your spine). Relax and breathe out. Repeat this 4 or 5 times. This exercise will help maintain normal movement of your chest, making it easier for your lungs to expand. Continue to do deep breathing exercises from now on.  Avoid sleeping on your affected arm or on that side.  Do not lift anything over 5 pounds.  Stop exercising if you develop pain in your chest, arm or shoulder, and call your caregiver.  Let your caregiver or therapist know if your arm becomes swollen after exercising.  Exercise in front of a mirror. This way you can see yourself exercising in a correct posture and using the correct motion that is recommended.  Do not use a heating pad on your arm of the side of the surgery. GENERAL GUIDELINES FOR EXERCISE The exercises described here can be done as soon as your doctor gives you permission. Be sure to talk to your doctor before trying any of them.   You will feel some tightness in your chest and armpit after surgery. This is normal. The tightness will decrease as  you continue your exercise program.  Many women have a burning, tingling, numbness, or soreness on the back of the arm and/or chest wall. This is because the surgery irritated some of your nerve endings. Although the sensations may increase a few weeks after surgery, continue to do the exercises unless you notice unusual swelling or tenderness. (Tell your caregiver if this occurs.) Sometimes rubbing or stroking the area with your hand or a soft cloth can help make the area less sensitive.  It may be helpful to do exercises after a warm shower when muscles are warm and relaxed.  Wear comfortable, loose clothing when doing the exercises.  Do the exercises until you feel a slow stretch. Hold each stretch at the end of the motion for a count of five. It is normal to feel some pulling as you stretch the skin and muscles that have been shortened because of the surgery. Do not do bouncing or jerky-type movements when doing any of the exercises. You should not feel pain as you do the exercises, only gentle stretching.  Do 5 to 7 repetitions of each exercise. Try to do each exercise correctly. If you  have difficulty with the exercises, contact your doctor. You may need to be referred to a physical or occupational therapist.  Exercises should be done twice a day until you regain normal flexibility and strength.  Be sure to take deep breaths, in and out, as you perform each exercise.  The exercises are designed so that you begin them lying down, move to sitting, and then finish standing. EXERCISES IN LYING POSITION These exercises should be performed on a bed or on the floor while lying on your back. Keep your knees and hips bent and feet flat.  Wand Exercise This exercise helps increase the forward motion of the shoulders. You will need a broom handle, yardstick, or other similar object to perform this exercise.   Hold the wand in both hands with palms facing up.  Lift the wand up over your head (as  far as you can) using your unaffected arm to help lift the wand, until you feel a stretch in your affected arm.  Hold for five seconds.  Lower arms and repeat 5 to 7 times. Elbow Winging This exercise helps increase the mobility of the front of your chest and shoulder. It may take several weeks of regular exercise before your elbows will get close to the bed (or floor).   Clasp your hands behind your neck with your elbows pointing toward the ceiling.  Move your elbows apart and down toward the bed (or floor).  Repeat 5 to 7 times. EXERCISES IN SITTING POSITION Shoulder Blade Stretch This exercise helps increase the mobility of the shoulder blades.   Sit in a chair very close to a table.  Place the unaffected arm on the table with your elbow bent and palm down. Do not move this arm during the exercise.  Place the affected arm on the table, palm down with your elbow straight.  Without moving your trunk, slide the affected arm toward the opposite side of the table. You should feel your shoulder blade move as you do this.  Relax your arm and repeat 5 to 7 times. Shoulder Blade Squeeze This exercise also helps increase the mobility of the shoulder blade.   Facing straight ahead, sit in a chair in front of a mirror without resting on the back of the chair.  Arms should be at your sides with elbows bent.  Squeeze shoulder blades together, bringing your elbows behind you. Keep your shoulders level as you do this exercise. Do not lift your shoulders up toward your ears.  Return to the starting position and repeat 5 to 7 times. Side Bending This exercise helps increase the mobility of the trunk/body.   Clasp your hands together in front of you and lift your arms slowly over your head, straightening your arms.  When your arms are over your head, bend your trunk to the right while bending at the waist and keeping your arms overhead.  Return to the starting position and bend to the  left.  Repeat 5 to 7 times. EXERCISES IN STANDING POSITION Chest Wall Stretch This exercise helps stretch the chest wall.   Stand facing a corner with toes approximately 8 to 10 inches from the corner.  Bend your elbows and place forearms on the wall, one on each side of the corner. Your elbows should be as close to shoulder height as possible.  Keep your arms and feet in position and move your chest toward the corner. You will feel a stretch across your chest and shoulders.  Return  to starting position and repeat 5 to 7 times. Shoulder Stretch This exercise helps increase the mobility in the shoulder.   Stand facing the wall with your toes approximately 8 to 10 inches from the wall.  Place your hands on the wall. Use your fingers to "climb the wall," reaching as high as you can until you feel a stretch.  Return to starting position and repeat 5 to 7 times. THINGS TO KEEP IN MIND  Begin exercising slowly and keep going as you are able. Stop exercising and call your caregiver if you:  Get weaker, start losing your balance or start falling.  Have pain that gets worse.  Have new heaviness in your arm.  Have unusual swelling, or swelling gets worse.  Have headaches, dizziness, blurred vision, new numbness or tingling in arms or chest. It is important to exercise to keep muscles working as well as possible, but it is also important to be safe. Talk with your caregiver about realistic exercises for your condition. Then set goals for increasing your physical activity level.  Document Released: 05/09/2006 Document Revised: 02/11/2013 Document Reviewed: 12/01/2008 [facility] Patient Information 2015 [place], [place]. This information is not intended to replace advice given to you by your health care provider. Make sure you discuss any questions you have with your health care provider.  Mastitis Mastitis is inflammation of the breast tissue. It occurs most often in women who are  breastfeeding, but it can also affect other women, and even sometimes men. CAUSES  Mastitis is usually caused by a bacterial infection. Bacteria enter the breast tissue through cuts or openings in the skin. Typically, this occurs with breastfeeding because of cracked or irritated skin. Sometimes, it can occur even when there is no opening in the skin. It can be associated with plugged milk (lactiferous) ducts. Nipple piercing can also lead to mastitis. Also, some forms of breast cancer can cause mastitis. SIGNS AND SYMPTOMS   Swelling, redness, tenderness, and pain in an area of the breast.  Swelling of the glands under the arm on the same side.  Fever. If an infection is allowed to progress, a collection of pus (abscess) may develop. DIAGNOSIS  Your health care provider can usually diagnose mastitis based on your symptoms and a physical exam. Tests may be done to help confirm the diagnosis. These may include:   Removal of pus from the breast by applying pressure to the area. This pus can be examined in the lab to determine which bacteria are present. If an abscess has developed, the fluid in the abscess can be removed with a needle. This can also be used to confirm the diagnosis and determine the bacteria present. In most cases, pus will not be present.  Blood tests to determine if your body is fighting a bacterial infection.  Mammogram or ultrasound tests to rule out other problems or diseases. TREATMENT  Antibiotic medicine is used to treat a bacterial infection. Your health care provider will determine which bacteria are most likely causing the infection and will select an appropriate antibiotic. This is sometimes changed based on the results of tests performed to identify the bacteria, or if there is no response to the antibiotic selected. Antibiotics are usually given by mouth. You may also be given medicine for pain. Mastitis that occurs with breastfeeding will sometimes go away on its  own, so your health care provider may choose to wait 24 hours after first seeing you to decide whether a prescription medicine is needed. HOME  CARE INSTRUCTIONS   Only take over-the-counter or prescription medicines for pain, fever, or discomfort as directed by your health care provider.  If your health care provider prescribed an antibiotic, take the medicine as directed. Make sure you finish it even if you start to feel better.  Do not wear a tight or underwire bra. Wear a soft, supportive bra.  Increase your fluid intake, especially if you have a fever.  Women who are breastfeeding should follow these instructions:  Continue to empty the breast. Your health care provider can tell you whether this milk is safe for your infant or needs to be thrown out. You may be told to stop nursing until your health care provider thinks it is safe for your baby. Use a breast pump if you are advised to stop nursing.  Keep your nipples clean and dry.  Empty the first breast completely before going to the other breast. If your baby is not emptying your breasts completely for some reason, use a breast pump to empty your breasts.  If you go back to work, pump your breasts while at work to stay in time with your nursing schedule.  Avoid allowing your breasts to become overly filled with milk (engorged). SEEK MEDICAL CARE IF:   You have pus-like discharge from the breast.  Your symptoms do not improve with the treatment prescribed by your health care provider within 2 days. SEEK IMMEDIATE MEDICAL CARE IF:   Your pain and swelling are getting worse.  You have pain that is not controlled with medicine.  You have a red line extending from the breast toward your armpit.  You have a fever or persistent symptoms for more than 2-3 days.  Lymphedema Lymphedema is a swelling caused by the abnormal collection of lymph under the skin. The lymph is fluid from the tissues in your body that travels in the  lymphatic system. This system is part of the immune system that includes lymph nodes and vessels. The lymph vessels collect and carry the excess fluid, fats, proteins, and wastes from the tissues of the body to the bloodstream. This system also works to clean and remove bacteria and waste products from the body.  Lymphedema occurs when the lymphatic system is blocked. When the lymph vessels or lymph nodes are blocked or damaged, lymph does not drain properly. This causes abnormal build up of lymph. This leads to swelling in the arms or legs. Lymphedema cannot be cured by medicines. But the swelling can be reduced by physical methods. CAUSES  There are two types of lymphedema. Primary lymphedema is caused by the absence or abnormality of the lymph vessel at birth. It is also known as inherited lymphedema, which occurs rarely. Secondary or acquired lymphedema occurs when the lymph vessel is damaged or blocked. The causes of lymph vessel blockage are:   Skin infection like cellulites.  Infection by parasites (filariasis).  Injury.  Cancer.  Radiation therapy.  Formation of scar tissue.  Surgery. SYMPTOMS  The symptoms of lymphedema are:  Abnormal swelling of the arm or leg.  Heavy or tight feeling in your arm or leg.  Tight-fitting shoes or rings.  Redness of skin over the affected area.  Limited movement of the affected limb.  Some patients complain about sensitivity to touch and discomfort in the limb(s) affected. You may not have these symptoms immediately following injury. They usually appear within a few days or even years after injury. Inform your caregiver, if you have any of these symptoms.  Early treatment can avoid further problems.  DIAGNOSIS  First, your caregiver will inquire about any surgery you have had or medicines you are taking. He will then examine you. Your caregiver may order special imaging tests, such as:  Lymphoscintigraphy (a test in which a low dose of  radioactive substance is injected to trace the flow of lymph through the lymph vessels).  MRI (imaging tests using magnetic fields).  Computed tomography (test using special cross-sectional X-rays).  Duplex ultrasound (test using high-frequency sound waves to show the vessels and the blood flow on a screen).  Lymphangiography (special X-ray taken after injecting a contrast dye into the lymph vessel). It is now rarely done. TREATMENT  Lymphedema can be treated in different ways. Your caregiver will decide the type of treatment depending on the cause. Treatment may include:  Exercise: Special exercises will help fluid move out easily from the affected part. This should be done as per your caregiver's advice.  Manual lymph drainage: Gentle massage of the affected limb makes the fluid to move out more freely.  Compression: Compression stockings or external pump apply pressure over the affected limb. This helps the fluid to move out from the arm or leg. Bandaging can also help to move the fluid out from the affected part. Your caregiver will decide the method that suits you the best.  Medicines: Your caregiver may prescribe antibiotics, if you have infection.  Surgery: Your caregiver may advise surgery for severe lymphedema. It is reserved for special cases when the patient has difficulty moving. Your surgeon may remove excess tissue from the arm or leg. This will help to ease your movement. Physical therapy may have to be continued after surgery. HOME CARE INSTRUCTIONS  The area is very fragile and is predisposed to injury and infection.  Eat a healthy diet.  Exercise regularly as per advice.  Keep the affected area clean and dry.  Use gloves while cooking or gardening.  Protect your skin from cuts.  Use electric razor to shave the affected area.  Keep affected limb elevated.  Do not wear tight clothes, shoes, or jewelry as it may cause the tissue to be strangled.  Do not use  heat pads over the affected area.  Do not sit with cross legs.  Do not walk barefoot.  Do not carry weight on the affected arm.  Avoid having blood pressure checked on the affected limb. SEEK MEDICAL CARE IF:  You continue to have swelling in your limb. SEEK IMMEDIATE MEDICAL CARE IF:   You have high fever.  You have skin rash.  You have chills or sweats.  You have pain or redness.  You have a cut that does not heal. MAKE SURE YOU:   Understand these instructions.  Will watch your condition.  Will get help right away if you are not doing well or get worse. Document Released: 08/14/2007 Document Revised: 10/03/2012 Document Reviewed: 07/20/2009 [facility] Patient Information 2015 [place], [place]. This information is not intended to replace advice given to you by your health care provider. Make sure you discuss any questions you have with your health care provider.   You have a fever and your symptoms suddenly get worse. Document Released: 10/17/2005 Document Revised: 10/22/2013 Document Reviewed: 05/17/2013 [facility] Patient Information 2015 [place], [place]. This information is not intended to replace advice given to you by your health care provider. Make sure you discuss any questions you have with your health care provider.

## 2014-11-12 NOTE — Anesthesia Procedure Notes (Signed)
Procedure Name: Intubation Date/Time: 11/12/2014 5:07 PM Performed by: Ofilia Neas Pre-anesthesia Checklist: Patient identified, Emergency Drugs available, Suction available, Patient being monitored and Timeout performed Patient Re-evaluated:Patient Re-evaluated prior to inductionOxygen Delivery Method: Circle system utilized Preoxygenation: Pre-oxygenation with 100% oxygen Intubation Type: Rapid sequence, IV induction and Cricoid Pressure applied Laryngoscope Size: Mac and 4 Grade View: Grade I Tube type: Oral Tube size: 7.5 mm Number of attempts: 1 Airway Equipment and Method: Stylet Placement Confirmation: ETT inserted through vocal cords under direct vision,  positive ETCO2 and breath sounds checked- equal and bilateral Secured at: 21 cm Tube secured with: Tape Dental Injury: Teeth and Oropharynx as per pre-operative assessment

## 2014-11-12 NOTE — Op Note (Signed)
11/12/2014  5:42 PM  PATIENT:  Laura Matthews  49 y.o. female  Patient Care Team: [name], MD as PCP - General (Family Medicine) Stark Klein, MD as Consulting Physician (General Surgery) Chauncey Cruel, MD as Consulting Physician (Oncology) Thea Silversmith, MD as Consulting Physician (Radiation Oncology) [facility] Bunnie Pion, NP as Nurse Practitioner (Nurse Practitioner) Stark Klein, MD as Consulting Physician (General Surgery)  PRE-OPERATIVE DIAGNOSIS:  BREAST ABSCESS  POST-OPERATIVE DIAGNOSIS:    Chronic left breast edema status post lumpectomy and chemotherapy for breast cancer.  Chronic recurrent left breast seroma possible abscess.  PROCEDURE:  Procedure(s):  INCISION AND DRAINAGE ABSCESS Biopsy of lumpectomy cavity  SURGEON:  Surgeon(s): Michael Boston, MD  ASSISTANT: RN   ANESTHESIA:   local and general  EBL:     Delay start of Pharmacological VTE agent (>24hrs) due to surgical blood loss or risk of bleeding:  no  DRAINS: none   SPECIMEN:  Source of Specimen:  1.  Aspirated fluid for culture.  2.  Deep wall of chronic seroma/abscess cavity for biopsy  DISPOSITION OF SPECIMEN:  PATHOLOGY  COUNTS:  YES  PLAN OF CARE: Admit for overnight observation  PATIENT DISPOSITION:  PACU - hemodynamically stable.  INDICATION: Woman diagnosed with breast cancer status post lumpectomy Sept 3220 complicated by hematoma requiring evacuation.  She is currently In the middle of postoperative adjuvant chemotherapy.  Has had chronic breast swelling.  felt most likely related to chronic breast lymphedema.  Sent for physical therapy.  She completed that without much improvement.    Difficulty getting but in to see surgeon due to insurance requirements with the new year & transportation issues.  Worsening pain and swelling after chemotherapy 2 days ago.    Was recommended to go to the emergency room.  Concern for abscess/infection/cellulitis.  I recommended aspiration and  probable incision and drainage given prior failure with observation only and prior evacuation.  Technique, risks, benefits, alternates discussed.  Questions answered and she agreed to proceed.  She seemed happy for our group to continue surgical care.  OR FINDINGS: Patient has chronic swelling of left breast with some edema.  More firm region in upper outer quadrant.  Some murkiness to the seroma cavity 6 x 6 x 6 cm.  Sent for culture.  Chronic cavity smooth without any firm nodularity for certain.  Biopsy done of deepest chest wall region  DESCRIPTION:   Patient was marked appropriately.  Identified.  Informed consent was confirmed.  Patient underwent general C's that typically.  She was positioned supine with arms out.  Her left chest and breast were prepped and draped in sterile fashion.  Surgical timeout confirmed our plan.    I used an 18-gauge needle through the prior lumpectomy incision  to encounter a fluid pocket in the upper outer quadrant of the breast.  It was primarily serous fluid with some murkiness associated with it.  Sent that for culture.    Because she has had a persistent recurrent collection with edema associated with this, I felt aspiration alone would not be sufficient and this required open drainage.  I created a 2 cm incision through the lumpectomy site to encounter the cavity of the prior lumpectomy.  Some fibrinous material and some necrotic material found - not severe.  Debrided and irrigated.  I made some biopsies of the deep posterior wall and some of the more firm areas.   I did not feel any definite hard or ulcerated region.  Tissues were viable.  No definite nodularity.  I packed the wound with a bottle of half-inch iodoform NU Gauze to good result.  Patient still had persistent left breast edema but it did seem to soften up after this.  I am not able to locate any friends or family in the hospital discussed postoperative care.  Will follow-upwith  the patient when  she is more awake.     Adin Hector, M.D., F.A.C.S. Gastrointestinal and Minimally Invasive Surgery [facility] [address], [place], [place] [address] [phone] Main / Paging

## 2014-11-12 NOTE — ED Notes (Signed)
Pt states she was diagnosed with breast cancer June 2015, pt states now she has redness, swelling, pain and hardness to left breast. Last chemo this Monday.

## 2014-11-12 NOTE — Anesthesia Preprocedure Evaluation (Addendum)
Anesthesia Evaluation  Patient identified by MRN, date of birth, ID band Patient awake    Reviewed: Allergy & Precautions, H&P , NPO status , Patient's Chart, lab work & pertinent test results  History of Anesthesia Complications Negative for: history of anesthetic complications  Airway Mallampati: II  TM Distance: >3 FB Neck ROM: Full    Dental  (+) Missing, Dental Advisory Given   Pulmonary Current Smoker,  breath sounds clear to auscultation        Cardiovascular - anginanegative cardio ROS  Rhythm:Regular Rate:Normal     Neuro/Psych  Headaches, Depression    GI/Hepatic Neg liver ROS, GERD-  Medicated and Controlled,  Endo/Other  negative endocrine ROSdiabetes  Renal/GU negative Renal ROS     Musculoskeletal  (+) Arthritis -, Osteoarthritis,    Abdominal   Peds  Hematology  (+) anemia ,   Anesthesia Other Findings Breast cancer  Reproductive/Obstetrics                            Anesthesia Physical  Anesthesia Plan  ASA: III  Anesthesia Plan: General   Post-op Pain Management:    Induction: Intravenous  Airway Management Planned: Oral ETT  Additional Equipment:   Intra-op Plan:   Post-operative Plan:   Informed Consent: I have reviewed the patients History and Physical, chart, labs and discussed the procedure including the risks, benefits and alternatives for the proposed anesthesia with the patient or authorized representative who has indicated his/her understanding and acceptance.   Dental advisory given  Plan Discussed with: CRNA and Surgeon  Anesthesia Plan Comments:        Anesthesia Quick Evaluation

## 2014-11-12 NOTE — H&P (Addendum)
[address] [place], [place] [address] Phone: [phone] FAX: [phone]     Laura Matthews  1965-11-20 [id number]  CARE TEAM:  PCP: [name], MD  Outpatient Care Team: Patient Care Team: [name], MD as PCP - General (Family Medicine) Stark Klein, MD as Consulting Physician (General Surgery) Chauncey Cruel, MD as Consulting Physician (Oncology) Thea Silversmith, MD as Consulting Physician (Radiation Oncology) [facility] Bunnie Pion, NP as Nurse Practitioner (Nurse Practitioner) Stark Klein, MD as Consulting Physician (General Surgery)  Inpatient Treatment Team: Treatment Team: Attending Provider: Jasper Riling. Alvino Chapel, MD; Technician: Karleen Dolphin, NT; Physician Assistant: Domenic Moras, PA-C; Consulting Physician: Nolon Nations, MD; Registered Nurse: Dahlia Bailiff, RN  This patient is a 49 y.o.female who presents today for surgical evaluation at the request of Dr Alvino Chapel.   Reason for evaluation: Worsening left breast pain and swelling.  Female diagnosed with breast cancer in the summer.  Underwent excisional breast biopsy with sentinel lymph node.  Developed swelling and had to have hematoma evacuated.  Has had some chronic breast swelling.  Dr. Barry Dienes felt most likely was right to lymphedema of the breast.  Patient had mammograms in late December showing 6 cm fluid collection suspicious for abscess versus seroma.  We offered to see her that day.  She rescheduled.  Then the patient claims her Medicaid insurance insisted that she obtain new primary care physician referral through primary care physician only since "my insurance changed without me knowing."  Therefore, she is delayed coming to the office.  She is in middle of getting cycles of chemotherapy.  Last dose 2 days ago.  Complained about worsening swelling.  Her medical oncologist recommended she go to the emergency room so that surgery could see her.   Dr. Barry Dienes not aware of this  Patient denies any left arm swelling.  Notes her breast has been swollen since the last hematoma evacuation in September.  Feels more red and tender.  Denies any nipple discharge.  No definite fevers or chills.   Past Medical History  Diagnosis Date  . Mental disorder     depression-  . Arthritis   . Hot flashes   . Head ache   . Malignant neoplasm of breast (female), unspecified site   . Family history of malignant neoplasm of breast   . Depression     pt denies   . Anemia     low iron    Past Surgical History  Procedure Laterality Date  . Knee surgery  1995    right  . Foot surgery  2011    rt  . Tubal ligation    . Uterine fibroid surgery  2005    ablation  . Breast lumpectomy with sentinel lymph node biopsy Left 06/11/2014    Procedure: BREAST LUMPECTOMY WITH SENTINEL LYMPH NODE BX, POSSIBLE AXILLARY LYMPH NODE DISSECTION;  Surgeon: Stark Klein, MD;  Location: [facility];  Service: General;  Laterality: Left;  . Portacath placement N/A 07/02/2014    Procedure: INSERTION PORT-A-CATH;  Surgeon: Stark Klein, MD;  Location: [facility];  Service: General;  Laterality: N/A;  . Incision and drainage Left 07/02/2014    Procedure: Drainage left breast seroma;  Surgeon: Stark Klein, MD;  Location: [facility];  Service: General;  Laterality: Left;  . Evacuation breast hematoma Left 07/10/2014    Procedure: EVACUATION OF LEFT BREAST HEMATOMA ;  Surgeon: Stark Klein, MD;  Location: [facility];  Service: General;  Laterality: Left;    History   Social History  . Marital Status: Single    Spouse Name: N/A    Number of Children: N/A  . Years of Education: N/A   Occupational History  . Not on file.   Social History Main Topics  . Smoking status: Current Every Day Smoker -- 0.25 packs/day    Types: Cigarettes  . Smokeless tobacco: Never Used  . Alcohol Use: No     Comment: Percoset,Vicodin, Oxycotin  . Drug  Use: Yes    Special: Oxycodone, Other-see comments     Comment: hx opiate abuse  . Sexual Activity: Not Currently    Birth Control/ Protection: Abstinence   Other Topics Concern  . Not on file   Social History Narrative   Lives with her daughter, [name]    Family History  Problem Relation Age of Onset  . Breast cancer Mother 84    currently 55; TAH/BSO d/t ?cervical ca at 4  . Breast cancer Maternal Aunt     dx 57s; currently in her late 59s  . Lung cancer Father     deceased 45    Current Facility-Administered Medications  Medication Dose Route Frequency Provider Last Rate Last Dose  . [START ON 11/13/2014] ceFAZolin (ANCEF) IVPB 2 g/50 mL premix  2 g Intravenous On Call to [place], MD       Current Outpatient Prescriptions  Medication Sig Dispense Refill  . bismuth subsalicylate (PEPTO BISMOL) 262 MG/15ML suspension Take 30 mLs by mouth every 6 (six) hours as needed for indigestion (indigestion).    Marland Kitchen dexamethasone (DECADRON) 4 MG tablet TAKE 2 TABLETS DAILY ON THE DAY AFTER CHEMO THEN 2 TABLETS 2 TIMES DAILY FOR 2 DAYS WITH FOOD 30 tablet 0  . lidocaine-prilocaine (EMLA) cream Apply 1 application topically as needed. 30 g 0  . omeprazole (PRILOSEC) 40 MG capsule Take 1 capsule (40 mg total) by mouth at bedtime. 30 capsule 6  . ondansetron (ZOFRAN ODT) 8 MG disintegrating tablet Take 1 tablet (8 mg total) by mouth every 8 (eight) hours as needed for nausea or vomiting. 20 tablet 0  . Oxycodone HCl 20 MG TABS Take 1 tablet (20 mg total) by mouth every 4 (four) hours as needed (pain). 15 tablet 0  . oxyCODONE-acetaminophen (PERCOCET/ROXICET) 5-325 MG per tablet Take 1 tablet by mouth every 8 (eight) hours as needed for severe pain. 30 tablet 0  . ranitidine (ZANTAC) 150 MG tablet Take 1 tablet (150 mg total) by mouth 2 (two) times daily. (Patient taking differently: Take 150 mg by mouth at bedtime as needed for heartburn (heartburn). ) 60 tablet 0  . Alum & Mag  Hydroxide-Simeth (MAGIC MOUTHWASH) SOLN Take 5 mLs by mouth 4 (four) times daily as needed for mouth pain.    . fluconazole (DIFLUCAN) 100 MG tablet Take 1 tablet (100 mg total) by mouth daily. (Patient not taking: Reported on 11/12/2014) 15 tablet 2  . ibuprofen (ADVIL,MOTRIN) 200 MG tablet Take 1 tablet (200 mg total) by mouth every 6 (six) hours as needed for moderate pain. 30 tablet 0  . LORazepam (ATIVAN) 0.5 MG tablet Take 1 tablet (0.5 mg total) by mouth every 6 (six) hours as needed (Nausea or vomiting). 30 tablet 0  . nicotine (NICODERM CQ) 21 mg/24hr patch Place 1 patch (21 mg total) onto the skin daily. (Patient not taking: Reported on 11/12/2014) 28 patch 1   Facility-Administered Medications Ordered in  Other Encounters  Medication Dose Route Frequency Provider Last Rate Last Dose  . sodium chloride 0.9 % injection 10 mL  10 mL Intracatheter PRN Chauncey Cruel, MD   10 mL at 07/29/14 1704     Allergies  Allergen Reactions  . Norco [Hydrocodone-Acetaminophen] Itching and Nausea Only    Pt reported allergy.    ROS: Constitutional:  No fevers, chills, sweats.  Weight stable Eyes:  No vision changes, No discharge HENT:  No sore throats, nasal drainage Lymph: No neck swelling, No bruising easily Pulmonary:  No cough, productive sputum CV: No orthopnea, PND  No exertional chest/neck/shoulder/arm pain. GI: No personal nor family history of GI/colon cancer, inflammatory bowel disease, irritable bowel syndrome, allergy such as Celiac Sprue, dietary/dairy problems, colitis, ulcers nor gastritis.  No recent sick contacts/gastroenteritis.  No travel outside the country.  No changes in diet. Renal: No UTIs, No hematuria Genital:  No drainage, bleeding, masses Musculoskeletal: No severe joint pain.  Good ROM major joints Skin:  No sores or lesions.  No rashes Heme/Lymph:  No easy bleeding.  No swollen lymph nodes Neuro: No focal weakness/numbness.  No seizures Psych: No suicidal  ideation.  No hallucinations  BP 160/88 mmHg  Pulse 89  Temp(Src) 98 F (36.7 C) (Oral)  Resp 20  SpO2 100%  Physical Exam: General: Pt awake/alert/oriented x4 in no major acute distress Eyes: PERRL, normal EOM. Sclera nonicteric Neuro: CN II-XII intact w/o focal sensory/motor deficits. Lymph: No head/neck/groin lymphadenopathy Psych:  No delerium/psychosis/paranoia HENT: Normocephalic, Mucus membranes moist.  No thrush Neck: Supple, No tracheal deviation Chest: No pain.  Good respiratory excursion. Breasts.  Bilateral ptotic breasts.  Left greater than twice the size than the right.  Upper-outer incision healed.  Firmness with some tenderness.  Some mild erythema and redness as well.  However no peau d'orange.  No axillary swelling. CV:  Pulses intact.  Regular rhythm Abdomen: Soft, Nondistended.  Nontender.  No incarcerated hernias. Ext:  SCDs BLE.  No significant edema.  No cyanosis Skin: No petechiae / purpurea.  No major sores Musculoskeletal: No severe joint pain.  Good ROM major joints   Results:   Labs: Results for orders placed or performed during the hospital encounter of 11/12/14 (from the past 48 hour(s))  CBC with Differential     Status: Abnormal   Collection Time: 11/12/14  2:21 PM  Result Value Ref Range   WBC 11.1 (H) 4.0 - 10.5 K/uL   RBC 3.71 (L) 3.87 - 5.11 MIL/uL   Hemoglobin 10.8 (L) 12.0 - 15.0 g/dL   HCT 33.2 (L) 36.0 - 46.0 %   MCV 89.5 78.0 - 100.0 fL   MCH 29.1 26.0 - 34.0 pg   MCHC 32.5 30.0 - 36.0 g/dL   RDW 17.2 (H) 11.5 - 15.5 %   Platelets 313 150 - 400 K/uL   Neutrophils Relative % 90 (H) 43 - 77 %   Neutro Abs 10.0 (H) 1.7 - 7.7 K/uL   Lymphocytes Relative 8 (L) 12 - 46 %   Lymphs Abs 0.9 0.7 - 4.0 K/uL   Monocytes Relative 2 (L) 3 - 12 %   Monocytes Absolute 0.3 0.1 - 1.0 K/uL   Eosinophils Relative 0 0 - 5 %   Eosinophils Absolute 0.0 0.0 - 0.7 K/uL   Basophils Relative 0 0 - 1 %   Basophils Absolute 0.0 0.0 - 0.1 K/uL  I-stat  chem 8, ed     Status: Abnormal   Collection Time:  11/12/14  2:38 PM  Result Value Ref Range   Sodium 136 135 - 145 mmol/L   Potassium 4.5 3.5 - 5.1 mmol/L   Chloride 100 96 - 112 mEq/L   BUN 16 6 - 23 mg/dL   Creatinine, Ser 0.50 0.50 - 1.10 mg/dL   Glucose, Bld 404 (H) 70 - 99 mg/dL   Calcium, Ion 1.22 1.12 - 1.23 mmol/L   TCO2 23 0 - 100 mmol/L   Hemoglobin 12.6 12.0 - 15.0 g/dL   HCT 37.0 36.0 - 46.0 %  I-Stat CG4 Lactic Acid, ED     Status: Abnormal   Collection Time: 11/12/14  2:38 PM  Result Value Ref Range   Lactic Acid, Venous 2.55 (H) 0.5 - 2.2 mmol/L    Imaging / Studies: Mm Digital Diagnostic Unilat L  10/28/2014   CLINICAL DATA:  Patient has swelling and pain from her left breast. She underwent lumpectomy for left breast carcinoma in August 2015 and is currently undergoing chemotherapy. She reports that she is had swelling from this breast well as pain since the surgery. She also reports that aspiration has been previously attempted.  EXAM: DIGITAL DIAGNOSTIC  LEFT MAMMOGRAM WITH CAD  ULTRASOUND LEFT BREAST  COMPARISON:  Prior exams  ACR Breast Density Category b: There are scattered areas of fibroglandular density.  FINDINGS: There is diffuse skin thickening as well as thickening of the suspensory ligaments throughout the left breast. Hazy opacity is seen throughout the lumpectomy site. No discrete masses seen mammographically. The most recent prior mammogram dated 05/09/2014 showed a post biopsy clip a more discrete mass. The area diffuse increased density is new since the prior study, but there are no other postlumpectomy mammograms.  Mammographic images were processed with CAD.  On physical exam, patient's breasts is swollen with an area of masslike fullness deep to the upper outer quadrant skin incision site.  Ultrasound is performed, showing a large fluid collection deep to the surgical incision in the upper outer quadrant. This is centered at 2 o'clock, 7 cm the nipple. It  measures approximately 5.7 cm x 2.1 cm x 4.4 cm. This is a simple fluid collection with no internal blood flow or septations. There are no sonographic solid masses to suggest residual or recurrent breast carcinoma. No axillary masses or adenopathy is seen.  IMPRESSION: Benign, presumed postoperative fluid collection deep to the left breast upper outer quadrant skin incision, measuring 5.7 cm in greatest dimension. This could potentially be an infected collection given the patient's symptoms and the evidence of diffuse breast edema. Alternatively, the diffuse breast edema may be related to chemotherapy.  No evidence of residual or new breast malignancy.  RECOMMENDATION: Surgical consultation for either aspiration or incision and drainage. Patient was offered an appointment with the surgeon's office today 2:30pm, but did not want to go to that appointment. Patient will be contacted by the surgeons office for a future appointment.  I have discussed the findings and recommendations with the patient. Results were also provided in writing at the conclusion of the visit. If applicable, a reminder letter will be sent to the patient regarding the next appointment.  BI-RADS CATEGORY  2: Benign.   Electronically Signed   By: Lajean Manes M.D.   On: 10/28/2014 14:30   US Breast [place]  10/28/2014   CLINICAL DATA:  Patient has swelling and pain from her left breast. She underwent lumpectomy for left breast carcinoma in August 2015 and is currently undergoing chemotherapy.  She reports that she is had swelling from this breast well as pain since the surgery. She also reports that aspiration has been previously attempted.  EXAM: DIGITAL DIAGNOSTIC  LEFT MAMMOGRAM WITH CAD  ULTRASOUND LEFT BREAST  COMPARISON:  Prior exams  ACR Breast Density Category b: There are scattered areas of fibroglandular density.  FINDINGS: There is diffuse skin thickening as well as thickening of the suspensory ligaments throughout  the left breast. Hazy opacity is seen throughout the lumpectomy site. No discrete masses seen mammographically. The most recent prior mammogram dated 05/09/2014 showed a post biopsy clip a more discrete mass. The area diffuse increased density is new since the prior study, but there are no other postlumpectomy mammograms.  Mammographic images were processed with CAD.  On physical exam, patient's breasts is swollen with an area of masslike fullness deep to the upper outer quadrant skin incision site.  Ultrasound is performed, showing a large fluid collection deep to the surgical incision in the upper outer quadrant. This is centered at 2 o'clock, 7 cm the nipple. It measures approximately 5.7 cm x 2.1 cm x 4.4 cm. This is a simple fluid collection with no internal blood flow or septations. There are no sonographic solid masses to suggest residual or recurrent breast carcinoma. No axillary masses or adenopathy is seen.  IMPRESSION: Benign, presumed postoperative fluid collection deep to the left breast upper outer quadrant skin incision, measuring 5.7 cm in greatest dimension. This could potentially be an infected collection given the patient's symptoms and the evidence of diffuse breast edema. Alternatively, the diffuse breast edema may be related to chemotherapy.  No evidence of residual or new breast malignancy.  RECOMMENDATION: Surgical consultation for either aspiration or incision and drainage. Patient was offered an appointment with the surgeon's office today 2:30pm, but did not want to go to that appointment. Patient will be contacted by the surgeons office for a future appointment.  I have discussed the findings and recommendations with the patient. Results were also provided in writing at the conclusion of the visit. If applicable, a reminder letter will be sent to the patient regarding the next appointment.  BI-RADS CATEGORY  2: Benign.   Electronically Signed   By: Lajean Manes M.D.   On: 10/28/2014 14:30     Medications / Allergies: per chart  Antibiotics: Anti-infectives    Start     Dose/Rate Route Frequency Ordered Stop   11/13/14 0600  ceFAZolin (ANCEF) IVPB 2 g/50 mL premix     2 g100 mL/hr over 30 Minutes Intravenous On call to O.R. 11/12/14 1554 11/14/14 0559   11/12/14 1415  clindamycin (CLEOCIN) IVPB 600 mg     600 mg100 mL/hr over 30 Minutes Intravenous  Once 11/12/14 1410 11/12/14 1520      Assessment  Laura Matthews  49 y.o. female  Day of Surgery  Procedure(s): INCISION AND DRAINAGE ABSCESS  Problem List:  Principal Problem:   Abscess of left breast Active Problems:   Breast cancer of upper-outer quadrant of left female breast   Lymphedema of breast   Persistent swelling of left breast most likely limited secondary to lymphedema.  Worsening tenderness and swelling with known fluid collection 2 weeks ago.    Difficulty with compliance due to homeless state, Medicaid, change in insurance.  Plan:  Discussed with the patient.  Discussed with Dr. Barry Dienes.  Given the fact that she is immunosuppressed with recent dose of chemotherapy, concerned that infection could be worsening since she has worsening  symptoms the past few days. I  recommended examination in the operating room with aspiration of fluid and probable incision and drainage with packing.  Allow the chronic cavity to close down.  I am skeptical that aspiration alone will work since this is failed and she has chronic fluid collection is worsening.  I am a little skeptical seromal catheter drain in the breast only would be sufficient.  IV antibiotics.  Transition to oral antibiotics if improves.    Outpatient follow-up with Dr. Barry Dienes if her insurance will allow this.  Apparently she was being told by her insurance company to establish in [facility] but tells me that she is satisfied with care under Dr. Barry Dienes and would like to continue with her if possible.  Reconsider physical therapy for lymphedema of the  breast if possible.  Defer to Dr. Barry Dienes.  She is aware.  Hyperglycemia.  Sliding scale insulin.  See if medicine can see to help evaluate for probable diabetes.  Seems a little too high to expect this from steroids.  Could explain healing problems and recurrent infection/wound problems  -VTE prophylaxis- SCDs, etc  -mobilize as tolerated to help recovery    Adin Hector, M.D., F.A.C.S. Gastrointestinal and Minimally Invasive Surgery [facility] [address], [place], [place] [address] [phone] Main / Paging   11/12/2014  Note: Portions of this report may have been transcribed using voice recognition software. Every effort was made to ensure accuracy; however, inadvertent computerized transcription errors may be present.   Any transcriptional errors that result from this process are unintentional.

## 2014-11-12 NOTE — ED Notes (Signed)
Pt to OR.

## 2014-11-12 NOTE — Transfer of Care (Signed)
Immediate Anesthesia Transfer of Care Note  Patient: Laura Matthews  Procedure(s) Performed: Procedure(s): ASPIRATION AND INCISION AND DRAINAGE LEFT BREAST ABSCESS (N/A)  Patient Location: PACU  Anesthesia Type:General  Level of Consciousness: awake, alert , oriented and patient cooperative  Airway & Oxygen Therapy: Patient Spontanous Breathing and Patient connected to face mask oxygen  Post-op Assessment: Report given to PACU RN, Post -op Vital signs reviewed and stable and Patient moving all extremities  Post vital signs: Reviewed and stable  Complications: No apparent anesthesia complications

## 2014-11-12 NOTE — ED Provider Notes (Signed)
CSN: [id number]     Arrival date & time 11/12/14  1231 History   First MD Initiated Contact with Patient 11/12/14 1326     Chief Complaint  Patient presents with  . Breast Problem  . chemo card      (Consider location/radiation/quality/duration/timing/severity/associated sxs/prior Treatment) HPI   49 year old female with history of breast cancer status post lumpectomy with sentinel node biopsy back in last August who is here complaining of left breast pain. Patient reported having pain and discomfort from the left breast immediately after having her surgery. A repeat surgery to the same breast was performed a month later with drainage of some fluids and has been collected. Patient has been receiving chemotherapy but states that her left breast continues to to be more painful red with associate swelling but without drainage. Patient reports that her oncologist Dr. [name] saw her 2 days ago for her left breast pain and recommended to come to the ER for further evaluation.  Pt report his suspicion is that pt has abscess in her breast. Patient also mentioned that her pain medication, oxycodone has not provided adequate relief.  Past Medical History  Diagnosis Date  . Mental disorder     depression-  . Arthritis   . Hot flashes   . Head ache   . Malignant neoplasm of breast (female), unspecified site   . Family history of malignant neoplasm of breast   . Depression     pt denies   . Anemia     low iron   Past Surgical History  Procedure Laterality Date  . Knee surgery  1995    right  . Foot surgery  2011    rt  . Tubal ligation    . Uterine fibroid surgery  2005    ablation  . Breast lumpectomy with sentinel lymph node biopsy Left 06/11/2014    Procedure: BREAST LUMPECTOMY WITH SENTINEL LYMPH NODE BX, POSSIBLE AXILLARY LYMPH NODE DISSECTION;  Surgeon: Stark Klein, MD;  Location: [facility];  Service: General;  Laterality: Left;  . Portacath placement N/A 07/02/2014     Procedure: INSERTION PORT-A-CATH;  Surgeon: Stark Klein, MD;  Location: [facility];  Service: General;  Laterality: N/A;  . Incision and drainage Left 07/02/2014    Procedure: Drainage left breast seroma;  Surgeon: Stark Klein, MD;  Location: [facility];  Service: General;  Laterality: Left;  . Evacuation breast hematoma Left 07/10/2014    Procedure: EVACUATION OF LEFT BREAST HEMATOMA ;  Surgeon: Stark Klein, MD;  Location[facility];  Service: General;  Laterality: Left;   Family History  Problem Relation Age of Onset  . Breast cancer Mother 50    currently 81; TAH/BSO d/t ?cervical ca at 65  . Breast cancer Maternal Aunt     dx 49s; currently in her late 61s  . Lung cancer Father     deceased 14   History  Substance Use Topics  . Smoking status: Current Every Day Smoker -- 0.25 packs/day    Types: Cigarettes  . Smokeless tobacco: Never Used  . Alcohol Use: No     Comment: Percoset,Vicodin, Oxycotin   OB History    Gravida Para Term Preterm AB TAB SAB Ectopic Multiple Living   3 3 3       3      Review of Systems  All other systems reviewed and are negative.     Allergies  Norco  Home Medications   Prior to  Admission medications   Medication Sig Start Date End Date Taking? Authorizing Provider  bismuth subsalicylate (PEPTO BISMOL) 262 MG/15ML suspension Take 30 mLs by mouth every 6 (six) hours as needed for indigestion (indigestion).   Yes Historical Provider, MD  dexamethasone (DECADRON) 4 MG tablet TAKE 2 TABLETS DAILY ON THE DAY AFTER CHEMO THEN 2 TABLETS 2 TIMES DAILY FOR 2 DAYS WITH FOOD 09/30/14  Yes Chauncey Cruel, MD  lidocaine-prilocaine (EMLA) cream Apply 1 application topically as needed. 07/15/14  Yes Marcelino Duster, NP  omeprazole (PRILOSEC) 40 MG capsule Take 1 capsule (40 mg total) by mouth at bedtime. 09/02/14  Yes Chauncey Cruel, MD  ondansetron (ZOFRAN ODT) 8 MG disintegrating tablet Take 1 tablet (8 mg total) by mouth  every 8 (eight) hours as needed for nausea or vomiting. 08/26/14  Yes Chauncey Cruel, MD  Oxycodone HCl 20 MG TABS Take 1 tablet (20 mg total) by mouth every 4 (four) hours as needed (pain). 09/17/14  Yes Clayton Bibles, PA-C  oxyCODONE-acetaminophen (PERCOCET/ROXICET) 5-325 MG per tablet Take 1 tablet by mouth every 8 (eight) hours as needed for severe pain. 11/10/14  Yes Maryanna Shape, NP  ranitidine (ZANTAC) 150 MG tablet Take 1 tablet (150 mg total) by mouth 2 (two) times daily. Patient taking differently: Take 150 mg by mouth at bedtime as needed for heartburn (heartburn).  06/03/14  Yes Hope Bunnie Pion, NP  Alum & Mag Hydroxide-Simeth (MAGIC MOUTHWASH) SOLN Take 5 mLs by mouth 4 (four) times daily as needed for mouth pain.    Historical Provider, MD  fluconazole (DIFLUCAN) 100 MG tablet Take 1 tablet (100 mg total) by mouth daily. Patient not taking: Reported on 11/12/2014 09/02/14   Chauncey Cruel, MD  ibuprofen (ADVIL,MOTRIN) 200 MG tablet Take 1 tablet (200 mg total) by mouth every 6 (six) hours as needed for moderate pain. 09/09/14   Chauncey Cruel, MD  LORazepam (ATIVAN) 0.5 MG tablet Take 1 tablet (0.5 mg total) by mouth every 6 (six) hours as needed (Nausea or vomiting). 07/15/14   Chauncey Cruel, MD  nicotine (NICODERM CQ) 21 mg/24hr patch Place 1 patch (21 mg total) onto the skin daily. Patient not taking: Reported on 11/12/2014 09/02/14   Chauncey Cruel, MD   BP 160/88 mmHg  Pulse 89  Temp(Src) 98 F (36.7 C) (Oral)  Resp 20  SpO2 100% Physical Exam  Constitutional: She appears well-developed and well-nourished. No distress.  HENT:  Head: Atraumatic.  Eyes: Conjunctivae are normal.  Neck: Neck supple.  Pulmonary/Chest:  Chaperone present:  L breast: moderate erythema noted throughout L breast with induration, ttp, and warmth.  No obvious abscess appreciate.  No peau d'orange.  No bloody nipple discharge.  Abdominal: Soft. There is no tenderness.  Neurological: She is  alert.  Skin: No rash noted.  Psychiatric: She has a normal mood and affect.  Nursing note and vitals reviewed.   ED Course  Procedures (including critical care time)  2:45 PM Patient with obvious cellulitic skin changes to left breast which has been ongoing for several months. Potential of underlying abscess as well. She reported intermittent fever, no fever today. I discussed care with Dr. Alvino Chapel who has evaluated patient and agrees that this is likely an infection. I've initiated IV antibiotic including clindamycin. I have also consult [facility] and spoke with provider who will see it evaluate patient in the ER for possible abscess treatment.  3:57 PM Pt will be admitted for  further care.   Labs Review Labs Reviewed  CBC WITH DIFFERENTIAL - Abnormal; Notable for the following:    WBC 11.1 (*)    RBC 3.71 (*)    Hemoglobin 10.8 (*)    HCT 33.2 (*)    RDW 17.2 (*)    Neutrophils Relative % 90 (*)    Neutro Abs 10.0 (*)    Lymphocytes Relative 8 (*)    Monocytes Relative 2 (*)    All other components within normal limits  I-STAT CHEM 8, ED - Abnormal; Notable for the following:    Glucose, Bld 404 (*)    All other components within normal limits  I-STAT CG4 LACTIC ACID, ED - Abnormal; Notable for the following:    Lactic Acid, Venous 2.55 (*)    All other components within normal limits  CULTURE, BLOOD (ROUTINE X 2)  CULTURE, BLOOD (ROUTINE X 2)    Imaging Review No results found.   EKG Interpretation None      MDM   Final diagnoses:  Left breast abscess    BP 160/88 mmHg  Pulse 89  Temp(Src) 98 F (36.7 C) (Oral)  Resp 20  SpO2 100%  I have reviewed nursing notes and vital signs. I personally reviewed the imaging tests through PACS system  I reviewed available ER/hospitalization records thought the EMR     Domenic Moras, PA-C 11/12/14 [place]. Alvino Chapel, MD 11/12/14 1640

## 2014-11-13 ENCOUNTER — Encounter (HOSPITAL_COMMUNITY): Payer: Self-pay | Admitting: Internal Medicine

## 2014-11-13 DIAGNOSIS — E1165 Type 2 diabetes mellitus with hyperglycemia: Secondary | ICD-10-CM | POA: Diagnosis present

## 2014-11-13 DIAGNOSIS — Z803 Family history of malignant neoplasm of breast: Secondary | ICD-10-CM | POA: Diagnosis not present

## 2014-11-13 DIAGNOSIS — N644 Mastodynia: Secondary | ICD-10-CM | POA: Diagnosis present

## 2014-11-13 DIAGNOSIS — Y838 Other surgical procedures as the cause of abnormal reaction of the patient, or of later complication, without mention of misadventure at the time of the procedure: Secondary | ICD-10-CM | POA: Diagnosis present

## 2014-11-13 DIAGNOSIS — Z79899 Other long term (current) drug therapy: Secondary | ICD-10-CM | POA: Diagnosis not present

## 2014-11-13 DIAGNOSIS — F17210 Nicotine dependence, cigarettes, uncomplicated: Secondary | ICD-10-CM | POA: Diagnosis present

## 2014-11-13 DIAGNOSIS — Z885 Allergy status to narcotic agent status: Secondary | ICD-10-CM | POA: Diagnosis not present

## 2014-11-13 DIAGNOSIS — Z590 Homelessness: Secondary | ICD-10-CM | POA: Diagnosis not present

## 2014-11-13 DIAGNOSIS — E119 Type 2 diabetes mellitus without complications: Secondary | ICD-10-CM

## 2014-11-13 DIAGNOSIS — N61 Inflammatory disorders of breast: Secondary | ICD-10-CM | POA: Diagnosis present

## 2014-11-13 DIAGNOSIS — C50412 Malignant neoplasm of upper-outer quadrant of left female breast: Secondary | ICD-10-CM | POA: Diagnosis present

## 2014-11-13 DIAGNOSIS — F329 Major depressive disorder, single episode, unspecified: Secondary | ICD-10-CM | POA: Diagnosis present

## 2014-11-13 DIAGNOSIS — I890 Lymphedema, not elsewhere classified: Secondary | ICD-10-CM | POA: Diagnosis present

## 2014-11-13 DIAGNOSIS — M1990 Unspecified osteoarthritis, unspecified site: Secondary | ICD-10-CM | POA: Diagnosis present

## 2014-11-13 DIAGNOSIS — D509 Iron deficiency anemia, unspecified: Secondary | ICD-10-CM | POA: Diagnosis present

## 2014-11-13 DIAGNOSIS — R51 Headache: Secondary | ICD-10-CM | POA: Diagnosis present

## 2014-11-13 DIAGNOSIS — N6489 Other specified disorders of breast: Secondary | ICD-10-CM | POA: Diagnosis present

## 2014-11-13 LAB — GLUCOSE, CAPILLARY
GLUCOSE-CAPILLARY: 217 mg/dL — AB (ref 70–99)
Glucose-Capillary: 228 mg/dL — ABNORMAL HIGH (ref 70–99)
Glucose-Capillary: 162 mg/dL — ABNORMAL HIGH (ref 70–99)
Glucose-Capillary: 225 mg/dL — ABNORMAL HIGH (ref 70–99)

## 2014-11-13 LAB — BASIC METABOLIC PANEL
ANION GAP: 6 (ref 5–15)
BUN: 12 mg/dL (ref 6–23)
CHLORIDE: 101 meq/L (ref 96–112)
CO2: 29 mmol/L (ref 19–32)
Calcium: 8.5 mg/dL (ref 8.4–10.5)
Creatinine, Ser: 0.46 mg/dL — ABNORMAL LOW (ref 0.50–1.10)
GFR calc non Af Amer: >90 mL/min (ref >90)
GLUCOSE: 191 mg/dL — AB (ref 70–99)
POTASSIUM: 4.2 mmol/L (ref 3.5–5.1)
Sodium: 136 mmol/L (ref 135–145)

## 2014-11-13 LAB — CBC
HEMATOCRIT: 26.7 % — AB (ref 36.0–46.0)
Hemoglobin: 8.8 g/dL — ABNORMAL LOW (ref 12.0–15.0)
MCH: 29.4 pg (ref 26.0–34.0)
MCHC: 33 g/dL (ref 30.0–36.0)
MCV: 89.3 fL (ref 78.0–100.0)
PLATELETS: 222 10*3/uL (ref 150–400)
RBC: 2.99 MIL/uL — AB (ref 3.87–5.11)
RDW: 17.1 % — AB (ref 11.5–15.5)
WBC: 8.1 10*3/uL (ref 4.0–10.5)

## 2014-11-13 LAB — TSH: TSH: 3.169 u[IU]/mL (ref 0.350–4.500)

## 2014-11-13 MED ORDER — INSULIN GLARGINE 100 UNIT/ML ~~LOC~~ SOLN
10.0000 [IU] | Freq: Every day | SUBCUTANEOUS | Status: DC
  Administered 2014-11-13 – 2014-11-15 (×3): 10 [IU] via SUBCUTANEOUS
  Filled 2014-11-13 (×3): qty 0.1

## 2014-11-13 MED ORDER — LIVING WELL WITH DIABETES BOOK
Freq: Once | Status: AC
  Administered 2014-11-13: 18:00:00
  Filled 2014-11-13: qty 1

## 2014-11-13 NOTE — Progress Notes (Signed)
Inpatient Diabetes Program Recommendations  AACE/ADA: New Consensus Statement on Inpatient Glycemic Control (2013)  Target Ranges:  Prepandial:   less than 140 mg/dL      Peak postprandial:   less than 180 mg/dL (1-2 hours)      Critically ill patients:  140 - 180 mg/dL   Reason for Visit: New-onset DM  Diabetes history: None Outpatient Diabetes medications: N/A Current orders for Inpatient glycemic control: Lantus 10 units QHS, Novolog resistant tidwc and hs  49 year old woman who was diagnosed with breast cancer 05/09/2014, admitted for I & D of L breast abscess. Pt c/o polyuria, polydipsia x 2 weeks. She also has been on Decadron with chemotherapy. No family hx DM.  Results for ZLATA, ALCAIDE (MRN [id number]) as of 11/13/2014 15:39  Ref. Range 11/12/2014 16:45 11/12/2014 18:01 11/12/2014 22:01 11/13/2014 07:41 11/13/2014 12:05  Glucose-Capillary Latest Range: 70-99 mg/dL 272 (H) 214 (H) 162 (H) 228 (H) 162 (H)   Results for KIMYA, MCCAHILL (MRN [id number]) as of 11/13/2014 15:39  Ref. Range 11/12/2014 14:38 11/13/2014 10:10  Glucose Latest Range: 70-99 mg/dL 404 (H) 191 (H)   HgbA1C pending. Pt states she eats fairly healthy diet. Has gained about 12 pounds since starting chemo. Has been drinking Gatorade and various juices. Discussed diabetes diagnosis, glucose monitoring, how diet and exercise affect blood sugars, and importance of f/u with PCP for glucose management. Pt states she will need PCP. Verbalized feeling overwelmed with cancer and diabetes diagnosis. If pt to go home on insulin, prefers insulin pen. To start Lantus 10 units QHS. Blood sugars much improved. Interested in OP Diabetes Education. Will order same. Living Well With Diabetes book and encouraged pt to view diabetes videos on pt ed channel. Discussed above with RN.  Will f/u in am. Thank you. Lorenda Peck, RD, LDN, CDE Inpatient Diabetes Coordinator [phone]

## 2014-11-13 NOTE — Progress Notes (Signed)
UR completed 

## 2014-11-13 NOTE — Evaluation (Signed)
Physical Therapy Evaluation Patient Details Name: Laura Matthews MRN: [id number] DOB: November 18, 1965 Today's Date: 11/13/2014   History of Present Illness  49 y.o. female with h/o breast cancer admitted with abscess of L breast. s/p I&D 11/12/14.   Clinical Impression  **Pt is independent with bed mobility, transfers, and walking, she walked 300' without assistive device, no LOB. From PT standpoint she is safe to DC home. She stated she fatigues quickly. Discussed energy conservation techniques and provided handout on this. REcommended shower stool, pt plans to buy privately.  HR 80, SaO2 99% on RA with walking. NO further acute PT needs, PT signing off.  *    Follow Up Recommendations No PT follow up    Equipment Recommendations  Other (comment) (recommended shower stool for energy conservation, pt plans to buy one privately)    Recommendations for Other Services       Precautions / Restrictions Precautions Precautions: None Restrictions Weight Bearing Restrictions: No      Mobility  Bed Mobility Overal bed mobility: Independent                Transfers Overall transfer level: Independent                  Ambulation/Gait Ambulation/Gait assistance: Independent Ambulation Distance (Feet): 300 Feet Assistive device: None Gait Pattern/deviations: WFL(Within Functional Limits)     General Gait Details: steady, no LOB  Stairs            Wheelchair Mobility    Modified Rankin (Stroke Patients Only)       Balance Overall balance assessment: Independent                                           Pertinent Vitals/Pain Pain Assessment: No/denies pain    Home Living Family/patient expects to be discharged to:: Private residence Living Arrangements: Alone (lives alone, but may go to daughter's home) Available Help at Discharge: Family;Available PRN/intermittently Type of Home: House       Home Layout: Two level Home Equipment:  None      Prior Function Level of Independence: Independent               Hand Dominance        Extremity/Trunk Assessment   Upper Extremity Assessment: Overall WFL for tasks assessed           Lower Extremity Assessment: Overall WFL for tasks assessed      Cervical / Trunk Assessment: Normal  Communication   Communication: No difficulties  Cognition Arousal/Alertness: Awake/alert Behavior During Therapy: WFL for tasks assessed/performed Overall Cognitive Status: Within Functional Limits for tasks assessed                      General Comments      Exercises        Assessment/Plan    PT Assessment Patent does not need any further PT services  PT Diagnosis     PT Problem List    PT Treatment Interventions     PT Goals (Current goals can be found in the Care Plan section) Acute Rehab PT Goals PT Goal Formulation: All assessment and education complete, DC therapy    Frequency     Barriers to discharge        Co-evaluation  End of Session   Activity Tolerance: Patient tolerated treatment well Patient left: in bed Nurse Communication: Mobility status    Functional Assessment Tool Used: clinical judgement Functional Limitation: Mobility: Walking and moving around Mobility: Walking and Moving Around Current Status [phone]): 0 percent impaired, limited or restricted Mobility: Walking and Moving Around Goal Status [phone]): 0 percent impaired, limited or restricted Mobility: Walking and Moving Around Discharge Status [phone]): 0 percent impaired, limited or restricted    Time: 1000-1013 PT Time Calculation (min) (ACUTE ONLY): 13 min   Charges:   PT Evaluation $Initial PT Evaluation Tier I: 1 Procedure PT Treatments $Gait Training: 8-22 mins   PT G Codes:   PT G-Codes **NOT FOR INPATIENT CLASS** Functional Assessment Tool Used: clinical judgement Functional Limitation: Mobility: Walking and moving around Mobility:  Walking and Moving Around Current Status (N1700): 0 percent impaired, limited or restricted Mobility: Walking and Moving Around Goal Status ([id number]): 0 percent impaired, limited or restricted Mobility: Walking and Moving Around Discharge Status [phone]): 0 percent impaired, limited or restricted    Philomena Doheny 11/13/2014, 10:18 AM [phone]

## 2014-11-13 NOTE — Consult Note (Cosign Needed)
Consult Note:   Laura Matthews OIN:[id number] DOB: 06/25/1966 DOA: 11/12/2014 PCP: [name], MD  Requesting physician: Dr. Michael Boston   Reason for consultation: Newly diagnosed diabetes.    History of Present Illness:   Laura Matthews is a 49 year old woman who was diagnosed with breast cancer 05/09/2014. She subsequently underwent excisional breast biopsy with sentinel lymph node mapping. Her postoperative course was complicated by progressive swelling requiring evacuation of the hematoma. She has had some chronic breast swelling since that time. The swelling was felt to be secondary to lymphedema. In December, the patient had a mammogram which showed a 6 cm fluid collection suspicious for abscess versus seroma. There was some delay before she could have this reevaluated, but she has been under the care of an oncologist getting chemotherapy with Taxol, last cycle given 11/10/14. She also has been on Decadron with chemotherapy. She was admitted by Dr. Johney Maine 11/12/14 and underwent incision and drainage of the left breast. Cultures were sent. She is on Ancef. Blood work, done on admission, revealed a serum glucose of 404. CBGs have been 162-228. Hemoglobin A1c is pending.  The patient reports excessive thirst and urination for about 2 weeks.  She is not sure if she has lost weight.  She first took decadron back in September with her first chemo.    Allergies:     Allergies  Allergen Reactions  . Hydrocodone Itching and Nausea Only    Past Medical History:     Past Medical History  Diagnosis Date  . Mental disorder     depression-  . Arthritis   . Hot flashes   . Head ache   . Malignant neoplasm of breast (female), unspecified site   . Family history of malignant neoplasm of breast   . Depression     pt denies   . Anemia     low iron    Past Surgical History:   Past Surgical History  Procedure Laterality Date  . Knee surgery  1995    right  . Foot surgery   2011    rt  . Tubal ligation    . Uterine fibroid surgery  2005    ablation  . Breast lumpectomy with sentinel lymph node biopsy Left 06/11/2014    Procedure: BREAST LUMPECTOMY WITH SENTINEL LYMPH NODE BX, POSSIBLE AXILLARY LYMPH NODE DISSECTION;  Surgeon: Stark Klein, MD;  Location: [facility];  Service: General;  Laterality: Left;  . Portacath placement N/A 07/02/2014    Procedure: INSERTION PORT-A-CATH;  Surgeon: Stark Klein, MD;  Location: [facility];  Service: General;  Laterality: N/A;  . Incision and drainage Left 07/02/2014    Procedure: Drainage left breast seroma;  Surgeon: Stark Klein, MD;  Location: [facility];  Service: General;  Laterality: Left;  . Evacuation breast hematoma Left 07/10/2014    Procedure: EVACUATION OF LEFT BREAST HEMATOMA ;  Surgeon: Stark Klein, MD;  Location: [facility];  Service: General;  Laterality: Left;    Current Medications:   Scheduled Meds: . acetaminophen  1,000 mg Oral TID  .  ceFAZolin (ANCEF) IV  2 g Intravenous Q8H  .  ceFAZolin (ANCEF) IV  2 g Intravenous STAT  . famotidine  20 mg Oral BID  . fluconazole  100 mg Oral Daily  . heparin  5,000 Units Subcutaneous 3 times per day  . insulin aspart  0-20 Units Subcutaneous TID WC  . insulin aspart  0-5 Units Subcutaneous QHS  .  lip balm  1 application Topical BID  . nicotine  21 mg Transdermal Daily  . pantoprazole  40 mg Oral Daily   Continuous Infusions: . dextrose 5 % and 0.45 % NaCl with KCl 40 mEq/L 100 mL/hr at 11/12/14 2315   PRN Meds:.alum & mag hydroxide-simeth, bisacodyl, bismuth subsalicylate, diphenhydrAMINE **OR** diphenhydrAMINE, HYDROmorphone (DILAUDID) injection, lactated ringers, lidocaine-prilocaine, LORazepam, magic mouthwash, menthol-cetylpyridinium, ondansetron, phenol, polyethylene glycol, promethazine, traMADol  Social History:    reports that she has been smoking Cigarettes.  She has been smoking about 0.25 packs per day. She  has never used smokeless tobacco. She reports that she uses illicit drugs (Oxycodone and Other-see comments). She reports that she does not drink alcohol.  Family History:   Family History  Problem Relation Age of Onset  . Breast cancer Mother 1    currently 76; TAH/BSO d/t ?cervical ca at 97  . Breast cancer Maternal Aunt     dx 54s; currently in her late 87s  . Lung cancer Father     deceased 56    Review of Systems:   Constitutional: No fever, no chills;  Appetite normal; No weight loss, no weight gain, + fatigue.  HEENT: + blurry vision, no diplopia, no pharyngitis, no dysphagia CV: No chest pain, no palpitations, no PND, no orthopnea, no edema.  Resp: +SOB, + cough, no pleuritic pain. GI: + nausea, no vomiting, no diarrhea, no melena, no hematochezia, no constipation, no abdominal pain.  GU: No dysuria, no hematuria, + frequency, no urgency. MSK: + myalgias, + arthralgias.  Neuro:  + headache, no focal neurological deficits, no history of seizures.  Psych: No depression, no anxiety.  Endo: + heat intolerance, no cold intolerance, + polyuria, + polydipsia  Skin: No rashes, no skin lesions.  Heme: No easy bruising.  Travel history: No recent travel.  Physical Exam:    Filed Vitals:   11/12/14 2015 11/12/14 2100 11/13/14 0230 11/13/14 0600  BP: 166/91  144/79 124/57  Pulse: 65  75 81  Temp: 98.1 F (36.7 C)  97.8 F (36.6 C) 98.9 F (37.2 C)  TempSrc: Oral  Oral Oral  Resp: 18  18 18   Height:  5\' 10"  (1.778 m)    Weight:  81.149 kg (178 lb 14.4 oz)    SpO2: 100%  100% 100%     Intake/Output Summary (Last 24 hours) at 11/13/14 0816 Last data filed at 11/13/14 0600  Gross per 24 hour  Intake   2325 ml  Output    300 ml  Net   2025 ml    General: Alert, awake, oriented x3, in no acute distress. HEENT: No bruits, no goiter. Heart: Regular rate and rhythm, without murmurs, rubs, gallops. Lungs: Clear to auscultation bilaterally. Abdomen: Soft, nontender, nondistended,  positive bowel sounds. Extremities: No clubbing cyanosis or edema with positive pedal pulses. Neuro: Grossly intact, nonfocal. Psych: Mood and affect normal.  Data Review:   Labs:  CBC:    Component Value Date/Time   WBC 8.1 11/13/2014 0538   WBC 6.6 11/10/2014 1408   HGB 8.8* 11/13/2014 0538   HGB 10.4* 11/10/2014 1408   HCT 26.7* 11/13/2014 0538   HCT 32.9* 11/10/2014 1408   PLT 222 11/13/2014 0538   PLT 294 11/10/2014 1408   MCV 89.3 11/13/2014 0538   MCV 90.3 11/10/2014 1408   NEUTROABS 10.0* 11/12/2014 1421   NEUTROABS 4.7 11/10/2014 1408   LYMPHSABS 0.9 11/12/2014 1421   LYMPHSABS 1.2 11/10/2014 1408   MONOABS 0.3  11/12/2014 1421   MONOABS 0.5 11/10/2014 1408   EOSABS 0.0 11/12/2014 1421   EOSABS 0.1 11/10/2014 1408   BASOSABS 0.0 11/12/2014 1421   BASOSABS 0.0 11/10/2014 8250    Basic Metabolic Panel:    Component Value Date/Time   NA 136 11/12/2014 1438   NA 138 10/27/2014 1403   K 4.5 11/12/2014 1438   K 3.8 10/27/2014 1403   CL 100 11/12/2014 1438   CO2 30* 10/27/2014 1403   CO2 26 09/17/2014 0700   BUN 16 11/12/2014 1438   BUN 14.7 10/27/2014 1403   CREATININE 0.50 11/12/2014 1438   CREATININE 0.7 10/27/2014 1403   GLUCOSE 404* 11/12/2014 1438   GLUCOSE 201* 10/27/2014 1403   CALCIUM 9.2 10/27/2014 1403   CALCIUM 9.2 09/17/2014 0700   CBG (last 3)   Recent Labs  11/12/14 1801 11/12/14 2201 11/13/14 0741  GLUCAP 214* 162* 228*     Radiological Exams: No results found.  Assessment and Plan:   Principal Problem:   Abscess of left breast  Management per surgery.  Active Problems:   Breast cancer of upper-outer quadrant of left female breast  F/U Oncology.    Lymphedema of breast  Stable.    DM (diabetes mellitus)  Hemoglobin A1c still not back.  DM coordinator/dietician consultations for teaching.  Continue SSI resistant scale, add 10 units of Lantus Q HS.  CBGs 162-228.  Thank you for this consultation.  We will follow  the patient with you.  Time Spent on Consult: 1 hour.  Dayle Mcnerney 11/13/2014, 8:16 AM

## 2014-11-13 NOTE — Progress Notes (Signed)
1 Day Post-Op  Subjective: She seems better, minimal drainage on the dressing.  I will pull out the iodoform and repack later today.  Objective: Vital signs in last 24 hours: Temp:  [97.8 F (36.6 C)-98.9 F (37.2 C)] 98.9 F (37.2 C) (01/14 0600) Pulse Rate:  [65-89] 81 (01/14 0600) Resp:  [12-20] 18 (01/14 0600) BP: (124-166)/(57-91) 124/57 mmHg (01/14 0600) SpO2:  [98 %-100 %] 100 % (01/14 0600) Weight:  [81.149 kg (178 lb 14.4 oz)] 81.149 kg (178 lb 14.4 oz) (01/13 2100) Last BM Date: 11/13/14 120 PO Diet:  Carb mod/heart healthy Afebrile VSS Labs show glucose still up H/H is down Rechecking labs Intake/Output from previous day: 01/13 0701 - 01/14 0700 In: 2325 [I.V.:1925; IV Piggyback:400] Out: 300 [Urine:300] Intake/Output this shift: Total I/O In: 120 [P.O.:120] Out: -   General appearance: alert, cooperative and no distress Breast dressing looks fine, I will pull the packing out later and see how she does.  Lab Results:   Recent Labs  11/12/14 1421 11/12/14 1438 11/13/14 0538  WBC 11.1*  --  8.1  HGB 10.8* 12.6 8.8*  HCT 33.2* 37.0 26.7*  PLT 313  --  222    BMET  Recent Labs  11/12/14 1438  NA 136  K 4.5  CL 100  GLUCOSE 404*  BUN 16  CREATININE 0.50   PT/INR No results for input(s): LABPROT, INR in the last 72 hours.  No results for input(s): AST, ALT, ALKPHOS, BILITOT, PROT, ALBUMIN in the last 168 hours.   Lipase     Component Value Date/Time   LIPASE 35 06/03/2014 1326     Studies/Results: No results found.  Medications: . acetaminophen  1,000 mg Oral TID  .  ceFAZolin (ANCEF) IV  2 g Intravenous Q8H  .  ceFAZolin (ANCEF) IV  2 g Intravenous STAT  . famotidine  20 mg Oral BID  . heparin  5,000 Units Subcutaneous 3 times per day  . insulin aspart  0-20 Units Subcutaneous TID WC  . insulin aspart  0-5 Units Subcutaneous QHS  . lip balm  1 application Topical BID  . pantoprazole  40 mg Oral Daily   . dextrose 5 % and 0.45 %  NaCl with KCl 40 mEq/L 100 mL/hr at 11/12/14 2315   Prior to Admission medications   Medication Sig Start Date End Date Taking? Authorizing Provider  bismuth subsalicylate (PEPTO BISMOL) 262 MG/15ML suspension Take 30 mLs by mouth every 6 (six) hours as needed for indigestion (indigestion).   Yes Historical Provider, MD  dexamethasone (DECADRON) 4 MG tablet TAKE 2 TABLETS DAILY ON THE DAY AFTER CHEMO THEN 2 TABLETS 2 TIMES DAILY FOR 2 DAYS WITH FOOD 09/30/14  Yes Chauncey Cruel, MD  lidocaine-prilocaine (EMLA) cream Apply 1 application topically as needed. 07/15/14  Yes Marcelino Duster, NP  omeprazole (PRILOSEC) 40 MG capsule Take 1 capsule (40 mg total) by mouth at bedtime. 09/02/14  Yes Chauncey Cruel, MD  ondansetron (ZOFRAN ODT) 8 MG disintegrating tablet Take 1 tablet (8 mg total) by mouth every 8 (eight) hours as needed for nausea or vomiting. 08/26/14  Yes Chauncey Cruel, MD  ranitidine (ZANTAC) 150 MG tablet Take 1 tablet (150 mg total) by mouth 2 (two) times daily. Patient taking differently: Take 150 mg by mouth at bedtime as needed for heartburn (heartburn).  06/03/14  Yes Hope Bunnie Pion, NP  Alum & Mag Hydroxide-Simeth (MAGIC MOUTHWASH) SOLN Take 5 mLs by mouth 4 (four) times  daily as needed for mouth pain.    Historical Provider, MD  fluconazole (DIFLUCAN) 100 MG tablet Take 1 tablet (100 mg total) by mouth daily. Patient not taking: Reported on 11/12/2014 09/02/14   Chauncey Cruel, MD  ibuprofen (ADVIL,MOTRIN) 200 MG tablet Take 1 tablet (200 mg total) by mouth every 6 (six) hours as needed for moderate pain. 09/09/14   Chauncey Cruel, MD  LORazepam (ATIVAN) 0.5 MG tablet Take 1 tablet (0.5 mg total) by mouth every 6 (six) hours as needed (Nausea or vomiting). 07/15/14   Chauncey Cruel, MD  nicotine (NICODERM CQ) 21 mg/24hr patch Place 1 patch (21 mg total) onto the skin daily. Patient not taking: Reported on 11/12/2014 09/02/14   Chauncey Cruel, MD      Assessment/Plan Abscess of left breas Breast cancer of upper-outer quadrant of left female breast  Lymphedema of breast Tobacco use Hx of depression  Plan:  I will change the dressing, check for cultures tomorrow and we will decide on discharge after we see show she does.  I changed the dressing and she has allot of packing in the breast.  I replaced most of it and will do dressing changes here.  Keep her on antibiotics for now, and check culture in AM. Ask Dr. [name] to see and help him decide on her ChemoRx.    LOS: 1 day    Whitley Strycharz 11/13/2014

## 2014-11-13 NOTE — Anesthesia Postprocedure Evaluation (Signed)
  Anesthesia Post-op Note  Patient: Laura Matthews  Procedure(s) Performed: Procedure(s) (LRB): ASPIRATION AND INCISION AND DRAINAGE LEFT BREAST ABSCESS (N/A)  Patient Location: PACU  Anesthesia Type: General  Level of Consciousness: awake and alert   Airway and Oxygen Therapy: Patient Spontanous Breathing  Post-op Pain: mild  Post-op Assessment: Post-op Vital signs reviewed, Patient's Cardiovascular Status Stable, Respiratory Function Stable, Patent Airway and No signs of Nausea or vomiting  Last Vitals:  Filed Vitals:   11/13/14 0230  BP: 144/79  Pulse: 75  Temp: 36.6 C  Resp: 18    Post-op Vital Signs: stable   Complications: No apparent anesthesia complications

## 2014-11-14 ENCOUNTER — Other Ambulatory Visit: Payer: Self-pay | Admitting: Nurse Practitioner

## 2014-11-14 LAB — GLUCOSE, CAPILLARY
GLUCOSE-CAPILLARY: 181 mg/dL — AB (ref 70–99)
GLUCOSE-CAPILLARY: 224 mg/dL — AB (ref 70–99)
GLUCOSE-CAPILLARY: 225 mg/dL — AB (ref 70–99)
Glucose-Capillary: 169 mg/dL — ABNORMAL HIGH (ref 70–99)

## 2014-11-14 LAB — HEMOGLOBIN A1C
HEMOGLOBIN A1C: 8.8 % — AB (ref <5.7)
Hgb A1c MFr Bld: 8.7 % — ABNORMAL HIGH (ref <5.7)
MEAN PLASMA GLUCOSE: 206 mg/dL — AB (ref <117)
MEAN PLASMA GLUCOSE: 203 mg/dL — AB (ref <117)

## 2014-11-14 MED ORDER — METFORMIN HCL ER 500 MG PO TB24
500.0000 mg | ORAL_TABLET | Freq: Two times a day (BID) | ORAL | Status: DC
  Administered 2014-11-14 – 2014-11-16 (×4): 500 mg via ORAL
  Filled 2014-11-14 (×9): qty 1

## 2014-11-14 MED ORDER — SODIUM CHLORIDE 0.9 % IJ SOLN
10.0000 mL | INTRAMUSCULAR | Status: DC | PRN
  Administered 2014-11-16: 10 mL
  Filled 2014-11-14: qty 40

## 2014-11-14 MED ORDER — SACCHAROMYCES BOULARDII 250 MG PO CAPS
250.0000 mg | ORAL_CAPSULE | Freq: Two times a day (BID) | ORAL | Status: DC
  Administered 2014-11-14 – 2014-11-16 (×5): 250 mg via ORAL
  Filled 2014-11-14 (×6): qty 1

## 2014-11-14 MED ORDER — SODIUM CHLORIDE 0.9 % IJ SOLN: 10.0000 mL | Freq: Two times a day (BID) | INTRAMUSCULAR | Status: DC

## 2014-11-14 MED ORDER — AMOXICILLIN-POT CLAVULANATE 875-125 MG PO TABS
1.0000 | ORAL_TABLET | Freq: Two times a day (BID) | ORAL | Status: DC
  Administered 2014-11-14 – 2014-11-16 (×5): 1 via ORAL
  Filled 2014-11-14 (×6): qty 1

## 2014-11-14 NOTE — Progress Notes (Signed)
2 Days Post-Op  Subjective: Some left breast pain.  Objective: Vital signs in last 24 hours: Temp:  [98 F (36.7 C)-98.1 F (36.7 C)] 98 F (36.7 C) (01/15 0600) Pulse Rate:  [80-106] 81 (01/15 0600) Resp:  [18] 18 (01/15 0600) BP: (117-157)/(64-82) 157/82 mmHg (01/15 0600) SpO2:  [98 %-100 %] 100 % (01/15 0600) Last BM Date: 11/13/14  Intake/Output from previous day: 01/14 0701 - 01/15 0700 In: 3340 [P.O.:840; I.V.:2400; IV Piggyback:100] Out: 800 [Urine:800] Intake/Output this shift:    PE: General- In NAD Left breast-wound clean-5 cm deep, some erythema present  Lab Results:   Recent Labs  11/12/14 1421 11/12/14 1438 11/13/14 0538  WBC 11.1*  --  8.1  HGB 10.8* 12.6 8.8*  HCT 33.2* 37.0 26.7*  PLT 313  --  222   BMET  Recent Labs  11/12/14 1438 11/13/14 1010  NA 136 136  K 4.5 4.2  CL 100 101  CO2  --  29  GLUCOSE 404* 191*  BUN 16 12  CREATININE 0.50 0.46*  CALCIUM  --  8.5   PT/INR No results for input(s): LABPROT, INR in the last 72 hours. Comprehensive Metabolic Panel:    Component Value Date/Time   NA 136 11/13/2014 1010   NA 136 11/12/2014 1438   NA 138 10/27/2014 1403   NA 141 10/20/2014 1102   K 4.2 11/13/2014 1010   K 4.5 11/12/2014 1438   K 3.8 10/27/2014 1403   K 3.7 10/20/2014 1102   CL 101 11/13/2014 1010   CL 100 11/12/2014 1438   CO2 29 11/13/2014 1010   CO2 30* 10/27/2014 1403   CO2 31* 10/20/2014 1102   CO2 26 09/17/2014 0700   BUN 12 11/13/2014 1010   BUN 16 11/12/2014 1438   BUN 14.7 10/27/2014 1403   BUN 7.6 10/20/2014 1102   CREATININE 0.46* 11/13/2014 1010   CREATININE 0.50 11/12/2014 1438   CREATININE 0.7 10/27/2014 1403   CREATININE 0.6 10/20/2014 1102   GLUCOSE 191* 11/13/2014 1010   GLUCOSE 404* 11/12/2014 1438   GLUCOSE 201* 10/27/2014 1403   GLUCOSE 184* 10/20/2014 1102   CALCIUM 8.5 11/13/2014 1010   CALCIUM 9.2 10/27/2014 1403   CALCIUM 9.4 10/20/2014 1102   CALCIUM 9.2 09/17/2014 0700   AST 15  10/27/2014 1403   AST 20 10/20/2014 1102   AST 11 09/17/2014 0700   AST 21 06/03/2014 1326   ALT 24 10/27/2014 1403   ALT 27 10/20/2014 1102   ALT 20 09/17/2014 0700   ALT 21 06/03/2014 1326   ALKPHOS 78 10/27/2014 1403   ALKPHOS 72 10/20/2014 1102   ALKPHOS 131* 09/17/2014 0700   ALKPHOS 75 06/03/2014 1326   BILITOT 0.22 10/27/2014 1403   BILITOT 0.31 10/20/2014 1102   BILITOT <0.2* 09/17/2014 0700   BILITOT 0.5 06/03/2014 1326   PROT 6.9 10/27/2014 1403   PROT 6.6 10/20/2014 1102   PROT 6.5 09/17/2014 0700   PROT 8.6* 06/03/2014 1326   ALBUMIN 3.8 10/27/2014 1403   ALBUMIN 3.3* 10/20/2014 1102   ALBUMIN 3.2* 09/17/2014 0700   ALBUMIN 4.7 06/03/2014 1326     Studies/Results: No results found.  Anti-infectives: Anti-infectives    Start     Dose/Rate Route Frequency Ordered Stop   11/13/14 0600  ceFAZolin (ANCEF) IVPB 2 g/50 mL premix  Status:  Discontinued     2 g100 mL/hr over 30 Minutes Intravenous On call to O.R. 11/12/14 1554 11/12/14 1847   11/13/14 0000  ceFAZolin (ANCEF)  IVPB 2 g/50 mL premix     2 g100 mL/hr over 30 Minutes Intravenous Every 8 hours 11/12/14 1624     11/12/14 1700  ceFAZolin (ANCEF) IVPB 2 g/50 mL premix     2 g100 mL/hr over 30 Minutes Intravenous STAT 11/12/14 1659 11/13/14 1700   11/12/14 1630  fluconazole (DIFLUCAN) tablet 100 mg  Status:  Discontinued     100 mg Oral Daily 11/12/14 1625 11/13/14 0832   11/12/14 1415  clindamycin (CLEOCIN) IVPB 600 mg     600 mg100 mL/hr over 30 Minutes Intravenous  Once 11/12/14 1410 11/12/14 1520      Assessment Principal Problem:   Abscess of left breast s/p I & D 11/12/14-wound clean; on IV abxs Active Problems:   Breast cancer of upper-outer quadrant of left female breast-on chemotherapy   Newly diagnosed diabetes-Internal Medicine managing    LOS: 2 days   Plan: Dressing change education.  Home when diabetes regimen and teaching done.  Need to hold off on chemotherapy.   HHRN.   Chauntay Paszkiewicz J 11/14/2014

## 2014-11-14 NOTE — Progress Notes (Signed)
Patient educated on Diabetic Diet by DI and RD. She is breast cancer patient at the [facility] and with New-onset DM.  DI and RD provided Plate Method handout and discussed food groups focusing on vegetables and protein as well as pairing carbohydrate foods with protein.  Pt had many questions about her diet and how to change it.  DI and RD provided alternatives to things she normally eats or drinks and educated on lessening the portion sizes. Also discussed the importance of eating 3 meals a day and not skipping meals.  Pt received examples of good meals and ways to modify how she is currently eating. Pt expressed concern about managing DM diagnosis along with undergoing cancer treatments. Encouraged pt to see outpatient oncology RD if she would like further assistance with her diet.   Expect good compliance  BMI: 25.7 Diet: Heart Healthy/Carb Modified (Pt is consuming 75-100% of meals)  Elmer Picker MS Dietetic Intern Pager Number [phone]

## 2014-11-14 NOTE — Care Management Note (Signed)
    Page 1 of 1   11/14/2014     12:38:09 PM CARE MANAGEMENT NOTE 11/14/2014  Patient:  Laura Matthews, Laura Matthews   Account Number:  [id number]  Date Initiated:  11/14/2014  Documentation initiated by:  Sunday Spillers  Subjective/Objective Assessment:   49 yo female admitted with breast abscess. PTA lived at home with son.     Action/Plan:   Home when stable   Anticipated DC Date:  11/17/2014   Anticipated DC Plan:  [place]  CM consult      [facility]  HOME HEALTH   Choice offered to / List presented to:  C-1 Patient        [place] arranged  HH-1 RN  [place].   Status of service:  Completed, signed off Medicare Important Message given?   (If response is "NO", the following Medicare IM given date fields will be blank) Date Medicare IM given:   Medicare IM given by:   Date Additional Medicare IM given:   Additional Medicare IM given by:    Discharge Disposition:  [place]  Per UR Regulation:  Reviewed for med. necessity/level of care/duration of stay  If discussed at [place] of Stay Meetings, dates discussed:    Comments:  11-14-14 Sunday Spillers RN CM 1200 Spoke with patient at bedside, no preference for [facility] agency, "just pick one". Contacted [facility], patient will d/c to son's home until her apt is ready. She will get address and call me. Left card for contact information.

## 2014-11-14 NOTE — Progress Notes (Signed)
Progress Note   GEM CONKLE NUU:[id number] DOB: 1966-04-09 DOA: 11/12/2014 PCP: [name], MD   Brief Narrative:   Laura Matthews is an 49 y.o. female with PMH of breast cancer, admitted by the surgical service for treatment of a breast abscess with incision and drainage who we are asked to consult on for newly diagnosed diabetes.  Assessment/Plan:   Principal Problem:  Abscess of left breast  Management per surgery.  Active Problems:  Breast cancer of upper-outer quadrant of left female breast  F/U Oncology.   Lymphedema of breast  Stable.   DM (diabetes mellitus)  Hemoglobin A1c 8.7 corresponding to a mean plasma glucose of 203.  DM coordinator/dietician consultations for teaching.  Patient taught how to self administer insulin.  Continue SSI resistant scale, 10 units of Lantus Q HS. CBGs 162-228.  Start Metformin 500 mg BID.  Thank you for this consultation. We will follow the patient with you.   Subjective:    Laura Matthews   Objective:    Filed Vitals:   11/13/14 1014 11/13/14 1334 11/13/14 2200 11/14/14 0600  BP: 137/73 120/64 117/75 157/82  Pulse: 80 106 84 81  Temp: 98.1 F (36.7 C) 98.1 F (36.7 C) 98 F (36.7 C) 98 F (36.7 C)  TempSrc: Oral Oral Oral Oral  Resp: 18 18 18 18   Height:      Weight:      SpO2: 99% 98% 98% 100%    Intake/Output Summary (Last 24 hours) at 11/14/14 0804 Last data filed at 11/14/14 0600  Gross per 24 hour  Intake   3170 ml  Output    800 ml  Net   2370 ml    Exam: Gen:  NAD Cardiovascular:  RRR, No M/R/G Respiratory:  Lungs CTAB Gastrointestinal:  Abdomen soft, NT/ND, + BS Extremities:  No C/E/C   Data Reviewed:    Labs: Basic Metabolic Panel:  Recent Labs Lab 11/12/14 1438 11/13/14 1010  NA 136 136  K 4.5 4.2  CL 100 101  CO2  --  29  GLUCOSE 404* 191*  BUN 16 12  CREATININE 0.50 0.46*  CALCIUM  --  8.5   GFR Estimated Creatinine Clearance: 95 mL/min (by  C-G formula based on Cr of 0.46).  CBC:  Recent Labs Lab 11/10/14 1408 11/12/14 1421 11/12/14 1438 11/13/14 0538  WBC 6.6 11.1*  --  8.1  NEUTROABS 4.7 10.0*  --   --   HGB 10.4* 10.8* 12.6 8.8*  HCT 32.9* 33.2* 37.0 26.7*  MCV 90.3 89.5  --  89.3  PLT 294 313  --  222   CBG:  Recent Labs Lab 11/13/14 0741 11/13/14 1205 11/13/14 1749 11/13/14 2237 11/14/14 0723  GLUCAP 228* 162* 217* 225* 181*   Hgb A1c:  Recent Labs  11/13/14 0538 11/13/14 1000  HGBA1C 8.8* 8.7*   Thyroid function studies:  Recent Labs  11/13/14 1010  TSH 3.169   Sepsis Labs:  Recent Labs Lab 11/10/14 1408 11/12/14 1421 11/12/14 1438 11/13/14 0538  WBC 6.6 11.1*  --  8.1  LATICACIDVEN  --   --  2.55*  --    Microbiology Recent Results (from the past 240 hour(s))  Blood culture (routine x 2)     Status: None (Preliminary result)   Collection Time: 11/12/14  2:22 PM  Result Value Ref Range Status   Specimen Description BLOOD RIGHT HAND  Final   Special Requests BOTTLES DRAWN AEROBIC AND ANAEROBIC [place]  Final   Culture   Final           BLOOD CULTURE RECEIVED NO GROWTH TO DATE CULTURE WILL BE HELD FOR 5 DAYS BEFORE ISSUING A FINAL NEGATIVE REPORT Performed at [facility]    Report Status PENDING  Incomplete  Blood culture (routine x 2)     Status: None (Preliminary result)   Collection Time: 11/12/14  2:26 PM  Result Value Ref Range Status   Specimen Description BLOOD RIGHT ARM  Final   Special Requests BOTTLES DRAWN AEROBIC AND ANAEROBIC 5CC EACH  Final   Culture   Final           BLOOD CULTURE RECEIVED NO GROWTH TO DATE CULTURE WILL BE HELD FOR 5 DAYS BEFORE ISSUING A FINAL NEGATIVE REPORT Performed at [facility]    Report Status PENDING  Incomplete  Anaerobic culture     Status: None (Preliminary result)   Collection Time: 11/12/14  5:57 PM  Result Value Ref Range Status   Specimen Description BREAST LEFT CAVTY  Final   Special Requests NONE   Final   Gram Stain   Final    RARE WBC PRESENT,BOTH PMN AND MONONUCLEAR NO ORGANISMS SEEN Performed at [facility]    Culture   Final    NO ANAEROBES ISOLATED; CULTURE IN PROGRESS FOR 5 DAYS Performed at [facility]    Report Status PENDING  Incomplete  Body fluid culture     Status: None (Preliminary result)   Collection Time: 11/12/14  5:57 PM  Result Value Ref Range Status   Specimen Description BREAST LEFT CAVITY  Final   Special Requests NONE  Final   Gram Stain   Final    RARE WBC PRESENT,BOTH PMN AND MONONUCLEAR NO ORGANISMS SEEN Performed at [facility]    Culture   Final    NO GROWTH 1 DAY Performed at [facility]    Report Status PENDING  Incomplete     Medications:   . acetaminophen  1,000 mg Oral TID  .  ceFAZolin (ANCEF) IV  2 g Intravenous Q8H  . famotidine  20 mg Oral BID  . heparin  5,000 Units Subcutaneous 3 times per day  . insulin aspart  0-20 Units Subcutaneous TID WC  . insulin aspart  0-5 Units Subcutaneous QHS  . insulin glargine  10 Units Subcutaneous QHS  . lip balm  1 application Topical BID  . pantoprazole  40 mg Oral Daily   Continuous Infusions: . dextrose 5 % and 0.45 % NaCl with KCl 40 mEq/L 100 mL/hr at 11/14/14 0655    Time spent: 25 minutes.   LOS: 2 days   RAMA,CHRISTINA  [facility] Pager [phone]. If unable to reach me by pager, please call my cell phone at [phone].  *Please refer to [email or web address], password [email or web address] to get updated schedule on who will round on this patient, as hospitalists switch teams weekly. If 7PM-7AM, please contact night-coverage at [email or web address], password [email or web address] for any overnight needs.  11/14/2014, 8:04 AM

## 2014-11-14 NOTE — Progress Notes (Signed)
Results for LOREENA, VALERI (MRN [id number]) as of 11/14/2014 13:20  Ref. Range 11/13/2014 17:49 11/13/2014 22:37 11/14/2014 07:23 11/14/2014 11:31  Glucose-Capillary  Latest Range: 70-99 mg/dL 217 (H) 225 (H) 181 (H) 169 (H)   Blood sugars much improved. Taught insulin pen administration and pt was able to return demonstration. Discussed glucose monitoring,diet, and f/u with PCP.  Will need prescription for insulin pen, pen needles and glucose meter and supplies.  Pt tearful and states she just doesn't know if she can do "all of this" along with wound care. Reassured her and encouragement given.  RN notified. Recommend discharging on very simple regimen, as pt appears to be overwhelmed with diagnosis of DM, along with cancer.  Thank you. Lorenda Peck, RD, LDN, CDE Inpatient Diabetes Coordinator [phone]

## 2014-11-15 LAB — BODY FLUID CULTURE: Culture: NO GROWTH

## 2014-11-15 LAB — GLUCOSE, CAPILLARY
GLUCOSE-CAPILLARY: 135 mg/dL — AB (ref 70–99)
GLUCOSE-CAPILLARY: 189 mg/dL — AB (ref 70–99)
Glucose-Capillary: 172 mg/dL — ABNORMAL HIGH (ref 70–99)
Glucose-Capillary: 128 mg/dL — ABNORMAL HIGH (ref 70–99)

## 2014-11-15 MED ORDER — BLOOD GLUCOSE METER KIT: PACK | Status: DC

## 2014-11-15 MED ORDER — METFORMIN HCL ER 500 MG PO TB24: 500.0000 mg | ORAL_TABLET | Freq: Two times a day (BID) | ORAL | Status: DC

## 2014-11-15 MED ORDER — GLIPIZIDE 5 MG PO TABS: 5.0000 mg | ORAL_TABLET | Freq: Two times a day (BID) | ORAL | Status: DC

## 2014-11-15 MED ORDER — GLIPIZIDE 5 MG PO TABS
5.0000 mg | ORAL_TABLET | Freq: Two times a day (BID) | ORAL | Status: DC
  Administered 2014-11-15 – 2014-11-16 (×3): 5 mg via ORAL
  Filled 2014-11-15 (×5): qty 1

## 2014-11-15 MED ORDER — INSULIN GLARGINE 100 UNIT/ML SOLOSTAR PEN: 15.0000 [IU] | PEN_INJECTOR | Freq: Every day | SUBCUTANEOUS | Status: DC

## 2014-11-15 NOTE — Progress Notes (Addendum)
Progress Note   IVAH GIRARDOT ZLD:[id number] DOB: Oct 15, 1966 DOA: 11/12/2014 PCP: [name], MD   Brief Narrative:   Laura Matthews is an 49 y.o. female with PMH of breast cancer, admitted by the surgical service for treatment of a breast abscess with incision and drainage who we are asked to consult on for newly diagnosed diabetes.  Assessment/Plan:   Principal Problem:  Abscess of left breast  Management per surgery.  Active Problems:  Breast cancer of upper-outer quadrant of left female breast  F/U Oncology.   Lymphedema of breast  Stable.   DM (diabetes mellitus)  Hemoglobin A1c 8.7 corresponding to a mean plasma glucose of 203.  CBGs [id number].  DM coordinator/dietician counseled patient.  Patient taught how to self administer insulin.  We'll need to go home with 15 units of Lantus Q HS (Solostar pen ordered),  Metformin 500 mg BID and glipizide 5 mg BID with close outpatient follow up and outpatient DM education.  (Added to D/C med-rec, orders placed).  Ordered a home glucose meter as well.    Thank you for this consultation. We will follow the patient with you.   Subjective:   Laura Matthews feels a bit overwhelmed but otherwise is without significant complaint.  Some breast discomfort, but otherwise now new complaints.  Objective:    Filed Vitals:   11/14/14 0600 11/14/14 1400 11/14/14 2105 11/15/14 0601  BP: 157/82 161/90 149/96 134/72  Pulse: 81 79 83 72  Temp: 98 F (36.7 C) 99.4 F (37.4 C) 98.5 F (36.9 C) 98 F (36.7 C)  TempSrc: Oral Oral Oral Oral  Resp: 18 18 18 18   Height:      Weight:      SpO2: 100% 100% 99% 100%    Intake/Output Summary (Last 24 hours) at 11/15/14 0809 Last data filed at 11/14/14 1758  Gross per 24 hour  Intake    820 ml  Output      0 ml  Net    820 ml    Exam: Gen:  NAD Cardiovascular:  RRR, No M/R/G Respiratory:  Lungs CTAB Gastrointestinal:  Abdomen soft, NT/ND, + BS Extremities:  No  C/E/C   Data Reviewed:    Labs: Basic Metabolic Panel:  Recent Labs Lab 11/12/14 1438 11/13/14 1010  NA 136 136  K 4.5 4.2  CL 100 101  CO2  --  29  GLUCOSE 404* 191*  BUN 16 12  CREATININE 0.50 0.46*  CALCIUM  --  8.5   GFR Estimated Creatinine Clearance: 95 mL/min (by C-G formula based on Cr of 0.46).  CBC:  Recent Labs Lab 11/10/14 1408 11/12/14 1421 11/12/14 1438 11/13/14 0538  WBC 6.6 11.1*  --  8.1  NEUTROABS 4.7 10.0*  --   --   HGB 10.4* 10.8* 12.6 8.8*  HCT 32.9* 33.2* 37.0 26.7*  MCV 90.3 89.5  --  89.3  PLT 294 313  --  222   CBG:  Recent Labs Lab 11/14/14 0723 11/14/14 1131 11/14/14 1654 11/14/14 2149 11/15/14 0738  GLUCAP 181* 169* 224* 225* 172*   Hgb A1c:  Recent Labs  11/13/14 0538 11/13/14 1000  HGBA1C 8.8* 8.7*   Thyroid function studies:  Recent Labs  11/13/14 1010  TSH 3.169   Sepsis Labs:  Recent Labs Lab 11/10/14 1408 11/12/14 1421 11/12/14 1438 11/13/14 0538  WBC 6.6 11.1*  --  8.1  LATICACIDVEN  --   --  2.55*  --  Microbiology Recent Results (from the past 240 hour(s))  Blood culture (routine x 2)     Status: None (Preliminary result)   Collection Time: 11/12/14  2:22 PM  Result Value Ref Range Status   Specimen Description BLOOD RIGHT HAND  Final   Special Requests BOTTLES DRAWN AEROBIC AND ANAEROBIC 5CC EACH  Final   Culture   Final           BLOOD CULTURE RECEIVED NO GROWTH TO DATE CULTURE WILL BE HELD FOR 5 DAYS BEFORE ISSUING A FINAL NEGATIVE REPORT Performed at [facility]    Report Status PENDING  Incomplete  Blood culture (routine x 2)     Status: None (Preliminary result)   Collection Time: 11/12/14  2:26 PM  Result Value Ref Range Status   Specimen Description BLOOD RIGHT ARM  Final   Special Requests BOTTLES DRAWN AEROBIC AND ANAEROBIC 5CC EACH  Final   Culture   Final           BLOOD CULTURE RECEIVED NO GROWTH TO DATE CULTURE WILL BE HELD FOR 5 DAYS BEFORE ISSUING A FINAL  NEGATIVE REPORT Performed at [facility]    Report Status PENDING  Incomplete  Anaerobic culture     Status: None (Preliminary result)   Collection Time: 11/12/14  5:57 PM  Result Value Ref Range Status   Specimen Description BREAST LEFT CAVTY  Final   Special Requests NONE  Final   Gram Stain   Final    RARE WBC PRESENT,BOTH PMN AND MONONUCLEAR NO ORGANISMS SEEN Performed at [facility]    Culture   Final    NO ANAEROBES ISOLATED; CULTURE IN PROGRESS FOR 5 DAYS Performed at [facility]    Report Status PENDING  Incomplete  Body fluid culture     Status: None (Preliminary result)   Collection Time: 11/12/14  5:57 PM  Result Value Ref Range Status   Specimen Description BREAST LEFT CAVITY  Final   Special Requests NONE  Final   Gram Stain   Final    RARE WBC PRESENT,BOTH PMN AND MONONUCLEAR NO ORGANISMS SEEN Performed at [facility]    Culture   Final    NO GROWTH 2 DAYS Performed at [facility]    Report Status PENDING  Incomplete     Medications:   . acetaminophen  1,000 mg Oral TID  . amoxicillin-clavulanate  1 tablet Oral Q12H  . famotidine  20 mg Oral BID  . heparin  5,000 Units Subcutaneous 3 times per day  . insulin aspart  0-20 Units Subcutaneous TID WC  . insulin aspart  0-5 Units Subcutaneous QHS  . insulin glargine  10 Units Subcutaneous QHS  . lip balm  1 application Topical BID  . metFORMIN  500 mg Oral BID WC  . pantoprazole  40 mg Oral Daily  . saccharomyces boulardii  250 mg Oral BID  . sodium chloride  10-40 mL Intracatheter Q12H   Continuous Infusions:    Time spent: 25 minutes.   LOS: 3 days   [facility] Pager [phone]. If unable to reach me by pager, please call my cell phone at [phone].  *Please refer to [email or web address], password [email or web address] to get updated schedule on who will round on this patient, as hospitalists switch teams weekly. If 7PM-7AM, please contact night-coverage  at [email or web address], password [email or web address] for any overnight needs.  11/15/2014, 8:09 AM

## 2014-11-15 NOTE — Progress Notes (Signed)
Patient ID: Laura Matthews, female   DOB: [birth year], 49 y.o.   MRN: [id number] [facility] Progress Note:   3 Days Post-Op  Subjective: Mental status is clear.   Objective: Vital signs in last 24 hours: Temp:  [98 F (36.7 C)-99.4 F (37.4 C)] 98 F (36.7 C) (01/16 0601) Pulse Rate:  [72-83] 72 (01/16 0601) Resp:  [18] 18 (01/16 0601) BP: (134-161)/(72-96) 134/72 mmHg (01/16 0601) SpO2:  [99 %-100 %] 100 % (01/16 0601)  Intake/Output from previous day: 01/15 0701 - 01/16 0700 In: 820 [P.O.:720; IV Piggyback:100] Out: -  Intake/Output this shift:    Physical Exam: Work of breathing is normal.  Packing tape removed and wound looks clean.  Dressed with 4x4 and not repacked.    Lab Results:  Results for orders placed or performed during the hospital encounter of 11/12/14 (from the past 48 hour(s))  Hemoglobin A1c     Status: Abnormal   Collection Time: 11/13/14 10:00 AM  Result Value Ref Range   Hgb A1c MFr Bld 8.7 (H) <5.7 %    Comment: (NOTE)                                                                       According to the ADA Clinical Practice Recommendations for 2011, when HbA1c is used as a screening test:  >=6.5%   Diagnostic of Diabetes Mellitus           (if abnormal result is confirmed) 5.7-6.4%   Increased risk of developing Diabetes Mellitus References:Diagnosis and Classification of Diabetes Mellitus,Diabetes OZYY,4825,00(BBCWU 1):S62-S69 and Standards of Medical Care in         Diabetes - 2011,Diabetes GQBV,6945,03 (Suppl 1):S11-S61.    Mean Plasma Glucose 203 (H) <117 mg/dL    Comment: Performed at [place] metabolic panel     Status: Abnormal   Collection Time: 11/13/14 10:10 AM  Result Value Ref Range   Sodium 136 135 - 145 mmol/L    Comment: Please note change in reference range.   Potassium 4.2 3.5 - 5.1 mmol/L    Comment: Please note change in reference range.   Chloride 101 96 - 112 mEq/L   CO2 29 19 - 32 mmol/L    Glucose, Bld 191 (H) 70 - 99 mg/dL   BUN 12 6 - 23 mg/dL   Creatinine, Ser 0.46 (L) 0.50 - 1.10 mg/dL   Calcium 8.5 8.4 - 10.5 mg/dL   GFR calc non Af Amer >90 >90 mL/min   GFR calc Af Amer >90 >90 mL/min    Comment: (NOTE) The eGFR has been calculated using the CKD EPI equation. This calculation has not been validated in all clinical situations. eGFR's persistently <90 mL/min signify possible Chronic Kidney Disease.    Anion gap 6 5 - 15  TSH     Status: None   Collection Time: 11/13/14 10:10 AM  Result Value Ref Range   TSH 3.169 0.350 - 4.500 uIU/mL    Comment: Performed at [facility]  Glucose, capillary     Status: Abnormal   Collection Time: 11/13/14 12:05 PM  Result Value Ref Range   Glucose-Capillary 162 (H) 70 - 99 mg/dL  Glucose, capillary  Status: Abnormal   Collection Time: 11/13/14  5:49 PM  Result Value Ref Range   Glucose-Capillary 217 (H) 70 - 99 mg/dL  Glucose, capillary     Status: Abnormal   Collection Time: 11/13/14 10:37 PM  Result Value Ref Range   Glucose-Capillary 225 (H) 70 - 99 mg/dL  Glucose, capillary     Status: Abnormal   Collection Time: 11/14/14  7:23 AM  Result Value Ref Range   Glucose-Capillary 181 (H) 70 - 99 mg/dL  Glucose, capillary     Status: Abnormal   Collection Time: 11/14/14 11:31 AM  Result Value Ref Range   Glucose-Capillary 169 (H) 70 - 99 mg/dL  Glucose, capillary     Status: Abnormal   Collection Time: 11/14/14  4:54 PM  Result Value Ref Range   Glucose-Capillary 224 (H) 70 - 99 mg/dL  Glucose, capillary     Status: Abnormal   Collection Time: 11/14/14  9:49 PM  Result Value Ref Range   Glucose-Capillary 225 (H) 70 - 99 mg/dL  Glucose, capillary     Status: Abnormal   Collection Time: 11/15/14  7:38 AM  Result Value Ref Range   Glucose-Capillary 172 (H) 70 - 99 mg/dL    Radiology/Results: No results found.  Anti-infectives: Anti-infectives    Start     Dose/Rate Route Frequency Ordered Stop    11/14/14 1300  amoxicillin-clavulanate (AUGMENTIN) 875-125 MG per tablet 1 tablet     1 tablet Oral Every 12 hours 11/14/14 1146     11/13/14 0600  ceFAZolin (ANCEF) IVPB 2 g/50 mL premix  Status:  Discontinued     2 g100 mL/hr over 30 Minutes Intravenous On call to O.R. 11/12/14 1554 11/12/14 1847   11/13/14 0000  ceFAZolin (ANCEF) IVPB 2 g/50 mL premix  Status:  Discontinued     2 g100 mL/hr over 30 Minutes Intravenous Every 8 hours 11/12/14 1624 11/14/14 1146   11/12/14 1700  ceFAZolin (ANCEF) IVPB 2 g/50 mL premix     2 g100 mL/hr over 30 Minutes Intravenous STAT 11/12/14 1659 11/13/14 1700   11/12/14 1630  fluconazole (DIFLUCAN) tablet 100 mg  Status:  Discontinued     100 mg Oral Daily 11/12/14 1625 11/13/14 0832   11/12/14 1415  clindamycin (CLEOCIN) IVPB 600 mg     600 mg100 mL/hr over 30 Minutes Intravenous  Once 11/12/14 1410 11/12/14 1520      Assessment/Plan: Problem List: Patient Active Problem List   Diagnosis Date Noted  . Abscess of left breast 11/12/2014  . Newly diagnosed diabetes 11/12/2014  . Neutropenia 08/05/2014  . Lymphedema of breast 08/05/2014  . Breast cancer metastasized to axillary lymph node 06/11/2014  . Family history of malignant neoplasm of breast   . Breast cancer of upper-outer quadrant of left female breast 05/19/2014  . Opiate dependence 05/01/2012    Class: Acute  . Homeless 05/01/2012    Class: Acute    Doing well.  Hopeful discharge tomorrow.   3 Days Post-Op    LOS: 3 days   Matt B. Hassell Done, MD, [facility] [phone] beeper [phone]  11/15/2014 9:15 AM

## 2014-11-15 NOTE — Care Management (Signed)
CARE MANAGEMENT NOTE 11/15/2014  Patient:  Laura Matthews, Laura Matthews   Account Number:  [id number]  Date Initiated:  11/14/2014  Documentation initiated by:  Laura Matthews  Subjective/Objective Assessment:   49 yo female admitted with breast abscess. PTA lived at home with son.     Action/Plan:   Home when stable   Anticipated DC Date:  11/17/2014   Anticipated DC Plan:  [place]  CM consult      [facility] Choice  HOME HEALTH   Choice offered to / List presented to:  C-1 Patient        [place] arranged  HH-1 RN  [place].   Status of service:  Completed, signed off Medicare Important Message given?   (If response is "NO", the following Medicare IM given date fields will be blank) Date Medicare IM given:   Medicare IM given by:   Date Additional Medicare IM given:   Additional Medicare IM given by:    Discharge Disposition:  [place]  Per UR Regulation:  Reviewed for med. necessity/level of care/duration of stay  If discussed at [place] of Stay Meetings, dates discussed:    Comments:  11/15/14 - 1220 - CM spoke with patient. She has decided to stay with her mother for one week following discharge. She has the keys to her new apartment but will not stay there yet. Provided the address of her mother's home, [address] [place], [place]. Provided Laura Matthews at [facility] with the address. Laura Sheffield RN BSN CCM [phone]  11-14-14 Laura Spillers RN CM 1200 Spoke with patient at bedside, no preference for [facility] agency, "just pick one". Contacted [facility], patient will d/c to son's home until her apt is ready. She will get address and call me. Left card for contact information.

## 2014-11-16 LAB — GLUCOSE, CAPILLARY: Glucose-Capillary: 151 mg/dL — ABNORMAL HIGH (ref 70–99)

## 2014-11-16 MED ORDER — HEPARIN SOD (PORK) LOCK FLUSH 100 UNIT/ML IV SOLN
500.0000 [IU] | INTRAVENOUS | Status: AC | PRN
Start: 2014-11-16 — End: 2014-11-16
  Administered 2014-11-16: 500 [IU]

## 2014-11-16 MED ORDER — OXYCODONE-ACETAMINOPHEN 7.5-325 MG PO TABS: 1.0000 | ORAL_TABLET | ORAL | Status: DC | PRN

## 2014-11-16 NOTE — Discharge Summary (Signed)
Reviewed discharge instructions with pt including follow-up appointments, medications, incision care, and precautions.  Pt verbalized understanding of all instructions.  Pt being d/c into care of her mother.

## 2014-11-16 NOTE — Discharge Summary (Signed)
Physician Discharge Summary  Patient ID: Laura Matthews MRN: [id number] DOB/AGE: 12/17/1965 49 y.o.  Admit date: 11/12/2014 Discharge date: 11/16/2014  Admission Diagnoses:  Infected seroma of the left breast  Discharge Diagnoses:  Same post drainage  Principal Problem:   Abscess of left breast Active Problems:   Breast cancer of upper-outer quadrant of left female breast   Lymphedema of breast   Newly diagnosed diabetes   Surgery:  Incision, drainage and packing of left breast abscess  Discharged Condition: improved  Hospital Course:   Had surgery on Friday;  Wick removed on Saturday.  Breast without pain and doing much better on Sunday.  Patient requested higher dose Percocet at discharge and ready to go home.    Consults: none  Significant Diagnostic Studies: none    Discharge Exam: Blood pressure 125/78, pulse 84, temperature 98.5 F (36.9 C), temperature source Oral, resp. rate 18, height 5' 10" (1.778 m), weight 178 lb 14.4 oz (81.149 kg), SpO2 100 %. Dressing applied to left breast drainage site.   Disposition: 01-Home or Self Care  Discharge Instructions    Ambulatory referral to Nutrition and Diabetic Education    Complete by:  As directed      Call MD for:  extreme fatigue    Complete by:  As directed      Call MD for:  hives    Complete by:  As directed      Call MD for:  persistant nausea and vomiting    Complete by:  As directed      Call MD for:  redness, tenderness, or signs of infection (pain, swelling, redness, odor or green/yellow discharge around incision site)    Complete by:  As directed      Call MD for:  severe uncontrolled pain    Complete by:  As directed      Call MD for:    Complete by:  As directed   Temperature > 101.5F     Change dressing (specify)    Complete by:  As directed   Dressing change: 2 times per day using gauze pads and tape.     Consult diabetes OP education    Complete by:  As directed      Diet - low sodium heart  healthy    Complete by:  As directed      Diet - low sodium heart healthy    Complete by:  As directed      Diet Carb Modified    Complete by:  As directed      Discharge instructions    Complete by:  As directed   Please see discharge instruction sheets.  Also refer to handout given an office.  Please call our office if you have any questions or concerns [phone]     Discharge instructions    Complete by:  As directed   It is VERY IMPORTANT that you follow up with a PCP on a regular basis.  Check your blood glucoses before each meal and at bedtime and maintain a log of your readings.  Bring this log with you when you follow up with your PCP so that he or she can adjust your insulin or oral medications at your follow up visit.     Discharge instructions    Complete by:  As directed   May shower but keep 4x4 dressing over drain site     Discharge wound care:    Complete by:  As directed     You have an open wound that requires packing, please see wound care instructions.  In general, remove all dressings, wash wound with soap and water and then replace with saline moistened gauze.  Do the dressing change at least every day.  Please call our office [phone] if you have further questions.     Driving Restrictions    Complete by:  As directed   No driving until off narcotics and can safely swerve away without pain during an emergency     Increase activity slowly    Complete by:  As directed   Walk an hour a day.  Use 20-30 minute walks.  When you can walk 30 minutes without difficulty, increase to low impact/moderate activities such as biking, jogging, swimming, sexual activity..  Eventually can increase to unrestricted activity when not feeling pain.  If you feel pain: STOP!Marland Kitchen   Let pain protect you from overdoing it.  Use ice/heat/over-the-counter pain medications to help minimize his soreness.  Use pain prescriptions as needed to remain active.  It is better to take extra pain medications  and be more active than to stay bedridden to avoid all pain medications.     Increase activity slowly    Complete by:  As directed      Lifting restrictions    Complete by:  As directed   Avoid heavy lifting initially.  Do not push through pain.  You have no specific weight limit.  Coughing and sneezing or four more stressful to your incision than any lifting you will do. Pain will protect you from injury.  Therefore, avoid intense activity until off all narcotic pain medications.  Coughing and sneezing or four more stressful to your incision than any lifting he will do.     May shower / Bathe    Complete by:  As directed      May walk up steps    Complete by:  As directed      Sexual Activity Restrictions    Complete by:  As directed   Sexual activity as tolerated.  Do not push through pain.  Pain will protect you from injury.     Walk with assistance    Complete by:  As directed   Walk over an hour a day.  May use a walker/cane/companion to help with balance and stamina.            Medication List    TAKE these medications        bismuth subsalicylate 301 SW/10XN suspension  Commonly known as:  PEPTO BISMOL  Take 30 mLs by mouth every 6 (six) hours as needed for indigestion (indigestion).     blood glucose meter kit and supplies  Dispense based on patient and insurance preference. Use up to four times daily as directed. (FOR ICD-9 250.00, 250.01).     dexamethasone 4 MG tablet  Commonly known as:  DECADRON  TAKE 2 TABLETS DAILY ON THE DAY AFTER CHEMO THEN 2 TABLETS 2 TIMES DAILY FOR 2 DAYS WITH FOOD     fluconazole 100 MG tablet  Commonly known as:  DIFLUCAN  Take 1 tablet (100 mg total) by mouth daily.     glipiZIDE 5 MG tablet  Commonly known as:  GLUCOTROL  Take 1 tablet (5 mg total) by mouth 2 (two) times daily before a meal.     ibuprofen 200 MG tablet  Commonly known as:  ADVIL,MOTRIN  Take 1 tablet (200 mg total) by mouth every 6 (six) hours  as needed for  moderate pain.     Insulin Glargine 100 UNIT/ML Solostar Pen  Commonly known as:  LANTUS  Inject 15 Units into the skin daily at 10 pm.     lidocaine-prilocaine cream  Commonly known as:  EMLA  Apply 1 application topically as needed.     LORazepam 0.5 MG tablet  Commonly known as:  ATIVAN  Take 1 tablet (0.5 mg total) by mouth every 6 (six) hours as needed (Nausea or vomiting).     magic mouthwash Soln  Take 5 mLs by mouth 4 (four) times daily as needed for mouth pain.     metFORMIN 500 MG 24 hr tablet  Commonly known as:  GLUCOPHAGE-XR  Take 1 tablet (500 mg total) by mouth 2 (two) times daily with a meal.     nicotine 21 mg/24hr patch  Commonly known as:  NICODERM CQ  Place 1 patch (21 mg total) onto the skin daily.     omeprazole 40 MG capsule  Commonly known as:  PRILOSEC  Take 1 capsule (40 mg total) by mouth at bedtime.     ondansetron 8 MG disintegrating tablet  Commonly known as:  ZOFRAN ODT  Take 1 tablet (8 mg total) by mouth every 8 (eight) hours as needed for nausea or vomiting.     oxyCODONE-acetaminophen 7.5-325 MG per tablet  Commonly known as:  PERCOCET  Take 1 tablet by mouth every 4 (four) hours as needed for pain.     ranitidine 150 MG tablet  Commonly known as:  ZANTAC  Take 1 tablet (150 mg total) by mouth 2 (two) times daily.           Follow-up Information    Follow up with BYERLY,FAERA, MD On 11/20/2014.   Specialty:  General Surgery   Why:  Be at the office for an appointment with Dr. Byerly at 2:00 PM your appointment is at 2:30 PM   Contact information:   [address] [place] [address] [phone]       Signed: , B 11/16/2014, 10:41 AM    

## 2014-11-16 NOTE — Progress Notes (Signed)
Progress Note   [name] FBX:[id number] DOB: April 02, 1966 DOA: 11/12/2014 PCP: [name], MD   Brief Narrative:   Laura Matthews is an 49 y.o. female with PMH of breast cancer, admitted by the surgical service for treatment of a breast abscess with incision and drainage who we are asked to consult on for newly diagnosed diabetes.  Assessment/Plan:   Principal Problem:  Abscess of left breast  Management per surgery.  Active Problems:  Breast cancer of upper-outer quadrant of left female breast  F/U Oncology.   Lymphedema of breast  Stable.   DM (diabetes mellitus)  Hemoglobin A1c 8.7 corresponding to a mean plasma glucose of 203.    DM coordinator/dietician counseled patient.  Patient taught how to self administer insulin.  We'll need to go home with 15 units of Lantus Q HS (Solostar pen ordered),  Metformin 500 mg BID and glipizide 5 mg BID with close outpatient follow up and outpatient DM education.  (Added to D/C med-rec, orders placed).  Ordered a home glucose meter as well.  CBGs 128-189 over past 24 hours.  Thank you for this consultation. We will follow the patient with you.   Subjective:   Laura Matthews feels well, answered her questions about diabetes care and follow up.  Objective:    Filed Vitals:   11/15/14 0601 11/15/14 1400 11/15/14 2100 11/16/14 0622  BP: 134/72 144/80 148/90 125/78  Pulse: 72 78 81 84  Temp: 98 F (36.7 C) 98.1 F (36.7 C) 98 F (36.7 C) 98.5 F (36.9 C)  TempSrc: Oral Oral Oral Oral  Resp: 18 18 18 18   Height:      Weight:      SpO2: 100% 100% 100% 100%    Intake/Output Summary (Last 24 hours) at 11/16/14 9191 Last data filed at 11/16/14 0500  Gross per 24 hour  Intake    480 ml  Output      0 ml  Net    480 ml    Exam: Gen:  NAD Cardiovascular:  RRR, No M/R/G Respiratory:  Lungs CTAB Gastrointestinal:  Abdomen soft, NT/ND, + BS Extremities:  No C/E/C   Data Reviewed:     Labs: Basic Metabolic Panel:  Recent Labs Lab 11/12/14 1438 11/13/14 1010  NA 136 136  K 4.5 4.2  CL 100 101  CO2  --  29  GLUCOSE 404* 191*  BUN 16 12  CREATININE 0.50 0.46*  CALCIUM  --  8.5   GFR Estimated Creatinine Clearance: 95 mL/min (by C-G formula based on Cr of 0.46).  CBC:  Recent Labs Lab 11/10/14 1408 11/12/14 1421 11/12/14 1438 11/13/14 0538  WBC 6.6 11.1*  --  8.1  NEUTROABS 4.7 10.0*  --   --   HGB 10.4* 10.8* 12.6 8.8*  HCT 32.9* 33.2* 37.0 26.7*  MCV 90.3 89.5  --  89.3  PLT 294 313  --  222   CBG:  Recent Labs Lab 11/15/14 0738 11/15/14 1200 11/15/14 1804 11/15/14 2111 11/16/14 0749  GLUCAP 172* 135* 128* 189* 151*   Hgb A1c:  Recent Labs  11/13/14 1000  HGBA1C 8.7*   Thyroid function studies:  Recent Labs  11/13/14 1010  TSH 3.169   Sepsis Labs:  Recent Labs Lab 11/10/14 1408 11/12/14 1421 11/12/14 1438 11/13/14 0538  WBC 6.6 11.1*  --  8.1  LATICACIDVEN  --   --  2.55*  --    Microbiology Recent Results (from the past 240  hour(s))  Blood culture (routine x 2)     Status: None (Preliminary result)   Collection Time: 11/12/14  2:22 PM  Result Value Ref Range Status   Specimen Description BLOOD RIGHT HAND  Final   Special Requests BOTTLES DRAWN AEROBIC AND ANAEROBIC 5CC EACH  Final   Culture   Final           BLOOD CULTURE RECEIVED NO GROWTH TO DATE CULTURE WILL BE HELD FOR 5 DAYS BEFORE ISSUING A FINAL NEGATIVE REPORT Performed at [facility]    Report Status PENDING  Incomplete  Blood culture (routine x 2)     Status: None (Preliminary result)   Collection Time: 11/12/14  2:26 PM  Result Value Ref Range Status   Specimen Description BLOOD RIGHT ARM  Final   Special Requests BOTTLES DRAWN AEROBIC AND ANAEROBIC 5CC EACH  Final   Culture   Final           BLOOD CULTURE RECEIVED NO GROWTH TO DATE CULTURE WILL BE HELD FOR 5 DAYS BEFORE ISSUING A FINAL NEGATIVE REPORT Performed at [facility]    Report Status PENDING  Incomplete  Anaerobic culture     Status: None (Preliminary result)   Collection Time: 11/12/14  5:57 PM  Result Value Ref Range Status   Specimen Description BREAST LEFT CAVTY  Final   Special Requests NONE  Final   Gram Stain   Final    RARE WBC PRESENT,BOTH PMN AND MONONUCLEAR NO ORGANISMS SEEN Performed at [facility]    Culture   Final    NO ANAEROBES ISOLATED; CULTURE IN PROGRESS FOR 5 DAYS Performed at [facility]    Report Status PENDING  Incomplete  Body fluid culture     Status: None   Collection Time: 11/12/14  5:57 PM  Result Value Ref Range Status   Specimen Description BREAST LEFT CAVITY  Final   Special Requests NONE  Final   Gram Stain   Final    RARE WBC PRESENT,BOTH PMN AND MONONUCLEAR NO ORGANISMS SEEN Performed at [facility]    Culture   Final    NO GROWTH 3 DAYS Performed at [facility]    Report Status 11/15/2014 FINAL  Final     Medications:   . acetaminophen  1,000 mg Oral TID  . amoxicillin-clavulanate  1 tablet Oral Q12H  . famotidine  20 mg Oral BID  . glipiZIDE  5 mg Oral BID AC  . heparin  5,000 Units Subcutaneous 3 times per day  . insulin aspart  0-20 Units Subcutaneous TID WC  . insulin aspart  0-5 Units Subcutaneous QHS  . insulin glargine  10 Units Subcutaneous QHS  . lip balm  1 application Topical BID  . metFORMIN  500 mg Oral BID WC  . pantoprazole  40 mg Oral Daily  . saccharomyces boulardii  250 mg Oral BID  . sodium chloride  10-40 mL Intracatheter Q12H   Continuous Infusions:    Time spent: 25 minutes.   LOS: 4 days   [facility] Pager [phone]. If unable to reach me by pager, please call my cell phone at [phone].  *Please refer to [email or web address], password [email or web address] to get updated schedule on who will round on this patient, as hospitalists switch teams weekly. If 7PM-7AM, please contact night-coverage at [email or web address], password  [email or web address] for any overnight needs.  11/16/2014, 8:21 AM

## 2014-11-17 ENCOUNTER — Other Ambulatory Visit: Payer: Self-pay

## 2014-11-17 ENCOUNTER — Ambulatory Visit: Payer: Self-pay

## 2014-11-17 ENCOUNTER — Ambulatory Visit: Payer: Self-pay | Admitting: Nurse Practitioner

## 2014-11-17 LAB — ANAEROBIC CULTURE

## 2014-11-18 LAB — CULTURE, BLOOD (ROUTINE X 2)
Culture: NO GROWTH
Culture: NO GROWTH

## 2014-11-24 ENCOUNTER — Ambulatory Visit: Payer: Self-pay

## 2014-11-24 ENCOUNTER — Other Ambulatory Visit: Payer: Self-pay

## 2014-11-24 ENCOUNTER — Ambulatory Visit: Payer: Self-pay | Admitting: Oncology

## 2014-11-25 ENCOUNTER — Telehealth: Payer: Self-pay | Admitting: *Deleted

## 2014-11-25 NOTE — Telephone Encounter (Signed)
This RN attempted to contact pt per no show appointment 1/25 and communication with mid level per concern pt was scheduled to see. Noted recent admission for " infected seroma ".  This RN received VM stating pt has not set up VM at this time.  Additional attempts will be made to follow up with pt.  This RN called [facility] to follow up per post d/c appointment with Dr Barry Dienes.  Per [facility] pt was scheduled for 11/20/2014 " and pt cancelled the appointment ".  This note will be forwarded to the breast navigators for communication of situation per [facility] pt.

## 2014-12-01 ENCOUNTER — Ambulatory Visit: Payer: Self-pay

## 2014-12-01 ENCOUNTER — Other Ambulatory Visit: Payer: Self-pay

## 2014-12-01 ENCOUNTER — Ambulatory Visit: Payer: Self-pay | Admitting: Oncology

## 2014-12-02 ENCOUNTER — Ambulatory Visit: Payer: Self-pay

## 2014-12-02 ENCOUNTER — Telehealth: Payer: Self-pay | Admitting: *Deleted

## 2014-12-02 NOTE — Telephone Encounter (Signed)
Spoke to pt concerning missed appts for f/u and chemo. Pt relate she had surgery 2 wks ago and is waiting for surgeon to release her back to start chemo again. Pt is scheduled to see Dr. Brantley Stage on 2/3 at 3:15. Confirmed appt. Informed pt to keep her appt at [facility] for f/u and chemo on 12/08/14. Received verbal understanding.

## 2014-12-04 ENCOUNTER — Encounter: Payer: Self-pay | Admitting: *Deleted

## 2014-12-04 ENCOUNTER — Telehealth: Payer: Self-pay | Admitting: Oncology

## 2014-12-04 NOTE — Telephone Encounter (Signed)
Patient confirmed appointment canceled for 02/08 to 02/22. Confirm appointment for 02/22

## 2014-12-04 NOTE — Progress Notes (Signed)
Called pt to discuss appt with Dr. Brantley Stage on 12/03/14. Pt relate she was started on anti-biotic and not to start treatment for at least 2 wks. Verified this from Dr. Josetta Huddle note.  Scheduled and confirmed f/u appt with Susanne Borders on 12/22/14 with lab and chemo at 1245. Pt denies further needs. Encourage pt to call with questions or concerns. Received verbal understanding. Contact information given.

## 2014-12-08 ENCOUNTER — Other Ambulatory Visit: Payer: Self-pay

## 2014-12-08 ENCOUNTER — Ambulatory Visit: Payer: Self-pay | Admitting: Physician Assistant

## 2014-12-08 ENCOUNTER — Ambulatory Visit: Payer: Self-pay

## 2014-12-09 ENCOUNTER — Ambulatory Visit: Payer: Self-pay

## 2014-12-15 ENCOUNTER — Ambulatory Visit: Payer: Self-pay

## 2014-12-16 ENCOUNTER — Ambulatory Visit: Payer: Self-pay

## 2014-12-22 ENCOUNTER — Encounter: Payer: Self-pay | Admitting: Nurse Practitioner

## 2014-12-22 ENCOUNTER — Other Ambulatory Visit: Payer: Self-pay | Admitting: Oncology

## 2014-12-22 ENCOUNTER — Telehealth: Payer: Self-pay | Admitting: Nurse Practitioner

## 2014-12-22 ENCOUNTER — Ambulatory Visit (HOSPITAL_BASED_OUTPATIENT_CLINIC_OR_DEPARTMENT_OTHER): Payer: Medicaid Other

## 2014-12-22 ENCOUNTER — Ambulatory Visit (HOSPITAL_BASED_OUTPATIENT_CLINIC_OR_DEPARTMENT_OTHER): Payer: Medicaid Other | Admitting: Nurse Practitioner

## 2014-12-22 ENCOUNTER — Encounter: Payer: Self-pay | Admitting: *Deleted

## 2014-12-22 ENCOUNTER — Other Ambulatory Visit (HOSPITAL_BASED_OUTPATIENT_CLINIC_OR_DEPARTMENT_OTHER): Payer: Medicaid Other

## 2014-12-22 ENCOUNTER — Other Ambulatory Visit: Payer: Medicaid Other

## 2014-12-22 ENCOUNTER — Other Ambulatory Visit: Payer: Self-pay | Admitting: *Deleted

## 2014-12-22 VITALS — BP 136/73 | HR 83 | Temp 98.4°F | Resp 18 | Wt 187.3 lb

## 2014-12-22 DIAGNOSIS — C50812 Malignant neoplasm of overlapping sites of left female breast: Secondary | ICD-10-CM

## 2014-12-22 DIAGNOSIS — C50912 Malignant neoplasm of unspecified site of left female breast: Secondary | ICD-10-CM

## 2014-12-22 DIAGNOSIS — C773 Secondary and unspecified malignant neoplasm of axilla and upper limb lymph nodes: Secondary | ICD-10-CM

## 2014-12-22 DIAGNOSIS — C50412 Malignant neoplasm of upper-outer quadrant of left female breast: Secondary | ICD-10-CM

## 2014-12-22 DIAGNOSIS — Z5111 Encounter for antineoplastic chemotherapy: Secondary | ICD-10-CM

## 2014-12-22 DIAGNOSIS — Z170 Estrogen receptor positive status [ER+]: Secondary | ICD-10-CM

## 2014-12-22 DIAGNOSIS — C50911 Malignant neoplasm of unspecified site of right female breast: Secondary | ICD-10-CM

## 2014-12-22 DIAGNOSIS — Z720 Tobacco use: Secondary | ICD-10-CM

## 2014-12-22 LAB — CBC WITH DIFFERENTIAL/PLATELET
BASO%: 0.3 % (ref 0.0–2.0)
BASOS ABS: 0 10*3/uL (ref 0.0–0.1)
EOS ABS: 0.2 10*3/uL (ref 0.0–0.5)
EOS%: 2.8 % (ref 0.0–7.0)
HEMATOCRIT: 33.3 % — AB (ref 34.8–46.6)
HEMOGLOBIN: 10.6 g/dL — AB (ref 11.6–15.9)
LYMPH#: 2 10*3/uL (ref 0.9–3.3)
LYMPH%: 26.4 % (ref 14.0–49.7)
MCH: 29.1 pg (ref 25.1–34.0)
MCHC: 31.8 g/dL (ref 31.5–36.0)
MCV: 91.5 fL (ref 79.5–101.0)
MONO#: 0.5 10*3/uL (ref 0.1–0.9)
MONO%: 7.1 % (ref 0.0–14.0)
NEUT%: 63.4 % (ref 38.4–76.8)
NEUTROS ABS: 4.8 10*3/uL (ref 1.5–6.5)
Platelets: 340 10*3/uL (ref 145–400)
RBC: 3.64 10*6/uL — AB (ref 3.70–5.45)
RDW: 14.3 % (ref 11.2–14.5)
WBC: 7.5 10*3/uL (ref 3.9–10.3)

## 2014-12-22 LAB — COMPREHENSIVE METABOLIC PANEL (CC13)
ALT: 33 U/L (ref 0–55)
AST: 28 U/L (ref 5–34)
Albumin: 3.9 g/dL (ref 3.5–5.0)
Alkaline Phosphatase: 72 U/L (ref 40–150)
Anion Gap: 8 mEq/L (ref 3–11)
BUN: 5.1 mg/dL — ABNORMAL LOW (ref 7.0–26.0)
CO2: 30 meq/L — AB (ref 22–29)
Calcium: 9.4 mg/dL (ref 8.4–10.4)
Chloride: 100 mEq/L (ref 98–109)
Creatinine: 0.7 mg/dL (ref 0.6–1.1)
EGFR: >90 mL/min/{1.73_m2} (ref >90)
Glucose: 162 mg/dl — ABNORMAL HIGH (ref 70–140)
Potassium: 4.1 mEq/L (ref 3.5–5.1)
SODIUM: 138 meq/L (ref 136–145)
Total Bilirubin: <0.2 mg/dL (ref 0.20–1.20)
Total Protein: 6.9 g/dL (ref 6.4–8.3)

## 2014-12-22 MED ORDER — ONDANSETRON 8 MG PO TBDP: 8.0000 mg | ORAL_TABLET | Freq: Three times a day (TID) | ORAL | Status: DC | PRN

## 2014-12-22 MED ORDER — OXYCODONE-ACETAMINOPHEN 7.5-325 MG PO TABS: 1.0000 | ORAL_TABLET | ORAL | Status: DC | PRN

## 2014-12-22 MED ORDER — ONDANSETRON 8 MG/NS 50 ML IVPB
INTRAVENOUS | Status: AC
  Filled 2014-12-22: qty 8

## 2014-12-22 MED ORDER — FAMOTIDINE IN NACL 20-0.9 MG/50ML-% IV SOLN
20.0000 mg | Freq: Once | INTRAVENOUS | Status: AC
  Administered 2014-12-22: 20 mg via INTRAVENOUS

## 2014-12-22 MED ORDER — SODIUM CHLORIDE 0.9 % IV SOLN
Freq: Once | INTRAVENOUS | Status: AC
  Administered 2014-12-22: 16:00:00 via INTRAVENOUS

## 2014-12-22 MED ORDER — PACLITAXEL CHEMO INJECTION 300 MG/50ML
80.0000 mg/m2 | Freq: Once | INTRAVENOUS | Status: AC
  Administered 2014-12-22: 162 mg via INTRAVENOUS
  Filled 2014-12-22: qty 27

## 2014-12-22 MED ORDER — SODIUM CHLORIDE 0.9 % IJ SOLN
10.0000 mL | INTRAMUSCULAR | Status: DC | PRN
  Administered 2014-12-22: 10 mL
  Filled 2014-12-22: qty 10

## 2014-12-22 MED ORDER — DEXAMETHASONE SODIUM PHOSPHATE 10 MG/ML IJ SOLN
INTRAMUSCULAR | Status: AC
  Filled 2014-12-22: qty 1

## 2014-12-22 MED ORDER — DEXAMETHASONE SODIUM PHOSPHATE 10 MG/ML IJ SOLN
4.0000 mg | Freq: Once | INTRAMUSCULAR | Status: AC
  Administered 2014-12-22: 4 mg via INTRAVENOUS

## 2014-12-22 MED ORDER — HEPARIN SOD (PORK) LOCK FLUSH 100 UNIT/ML IV SOLN
500.0000 [IU] | Freq: Once | INTRAVENOUS | Status: AC | PRN
  Administered 2014-12-22: 500 [IU]
  Filled 2014-12-22: qty 5

## 2014-12-22 MED ORDER — ONDANSETRON 8 MG/50ML IVPB (CHCC)
8.0000 mg | Freq: Once | INTRAVENOUS | Status: AC
  Administered 2014-12-22: 8 mg via INTRAVENOUS

## 2014-12-22 MED ORDER — DIPHENHYDRAMINE HCL 50 MG/ML IJ SOLN
25.0000 mg | Freq: Once | INTRAMUSCULAR | Status: AC
  Administered 2014-12-22: 25 mg via INTRAVENOUS

## 2014-12-22 MED ORDER — DIPHENHYDRAMINE HCL 50 MG/ML IJ SOLN
INTRAMUSCULAR | Status: AC
  Filled 2014-12-22: qty 1

## 2014-12-22 MED ORDER — FAMOTIDINE IN NACL 20-0.9 MG/50ML-% IV SOLN
INTRAVENOUS | Status: AC
  Filled 2014-12-22: qty 50

## 2014-12-22 NOTE — Progress Notes (Unsigned)
Documenting that I gave pt a total of 10 Bus passes today after appt.

## 2014-12-22 NOTE — Patient Instructions (Addendum)
[facility] Discharge Instructions for Patients Receiving Chemotherapy  Today you received the following chemotherapy agents: Taxol.  To help prevent nausea and vomiting after your treatment, we encourage you to take your nausea medication as prescribed.   If you develop nausea and vomiting that is not controlled by your nausea medication, call the clinic.   BELOW ARE SYMPTOMS THAT SHOULD BE REPORTED IMMEDIATELY:  *FEVER GREATER THAN 100.5 F  *CHILLS WITH OR WITHOUT FEVER  NAUSEA AND VOMITING THAT IS NOT CONTROLLED WITH YOUR NAUSEA MEDICATION  *UNUSUAL SHORTNESS OF BREATH  *UNUSUAL BRUISING OR BLEEDING  TENDERNESS IN MOUTH AND THROAT WITH OR WITHOUT PRESENCE OF ULCERS  *URINARY PROBLEMS  *BOWEL PROBLEMS  UNUSUAL RASH Items with * indicate a potential emergency and should be followed up as soon as possible.  Feel free to call the clinic you have any questions or concerns. The clinic phone number is [phone].    

## 2014-12-22 NOTE — Telephone Encounter (Signed)
per pof to sch pt appt-gave pt copy of sch °

## 2014-12-22 NOTE — Progress Notes (Signed)
[place]  [phone] Fax:([phone]) [phone]     ID: Laura Matthews DOB: 01-18-1966  MR#: [id number]  DHR#:[id number]  Patient Care Team: [name], MD as PCP - General (Family Medicine) Stark Klein, MD as Consulting Physician (General Surgery) Chauncey Cruel, MD as Consulting Physician (Oncology) Thea Silversmith, MD as Consulting Physician (Radiation Oncology) [facility] Bunnie Pion, NP as Nurse Practitioner (Nurse Practitioner)  CHIEF COMPLAINT: Stage III left breast cancer  CURRENT TREATMENT:adjuvant chemotherapy  BREAST CANCER HISTORY: From the original intake note:  The patient herself palpated a mass in her left breast and brought it to the attention of [facility] NP 04/22/2014. She palpated a large nontender mass at the 4:00 position as well as a small tender mass in the 6:00 position of the left breast. The patient was set up for imaging at the breast Center where on 05/05/2014 she underwent bilateral diagnostic mammography and left breast ultrasonography. The mammogram showed an irregular mass in the outer left breast with no other suspicious findings. This was firm and fixed at the 3:00 position approximately 10 cm from the nipple. Ultrasound showed a hypoechoic irregular solid mass in this area, measuring 1.8 cm. Ultrasound of the left axilla showed 2 suspicious level I lymph nodes, the largest measuring 1 cm, with a thickened pole. A smaller lymph node measured 0.6 cm but had no visible fatty hilum.  On 05/09/2014 the patient underwent biopsy of the left breast mass (she refused biopsy of the left axilla) which showed ([id number]) and invasive ductal carcinoma, grade 2, estrogen receptor positive at 100%, with strong staining intensity, progesterone receptor 80% positive, with weak staining intensity, with an MIB-1 of 80%, and no HER-2 amplification.  On 05/16/2014 the patient underwent bilateral breast MRI. This showed a 1.7 cm irregular enhancing  mass at the 3:00 position of the left breast associated with the biopsy cleft. There were 3 level I left axillary lymph nodes with thickened cortices. The largest measured 1.3 cm. There were no other findings of concern.  Her subsequent history is as detailed below.  INTERVAL HISTORY: Laura Matthews returns for follow up of her breast cancer. Since her last visit, she was admitted to the hospital with an infected seroma of the left breast. The breast was drained on 11/12/14. Treatment has been held since early January to allow for proper recovery.  She is due for week 5 of paclitaxel today. She has also been diagnosed with diabetes since our last visit. She takes lantus nightlight and is on glipizide and metformin daily. She is transitioning well with this condition. She has a home health nurse who comes to the house to check on her progress and blood sugar.  REVIEW OF SYSTEMS:  Laura Matthews denies fevers, chills, nausea, vomiting, or changes in bowel or bladder habits. She has no shortness of breath, chest pain, palpitations, or fatigue. She denies mouth sores, rashes, or peripheral neuropathy symptoms. Her left upper arm has mild lymphedema. Her left breast is obviously swollen when compared with the right, but not as discolored as before. A detailed review of systems was otherwise negative.   PAST MEDICAL HISTORY: Past Medical History  Diagnosis Date  . Mental disorder     depression-  . Arthritis   . Hot flashes   . Head ache   . Malignant neoplasm of breast (female), unspecified site   . Family history of malignant neoplasm of breast   . Depression     pt denies   . Anemia  low iron  . Newly diagnosed diabetes 11/12/2014    PAST SURGICAL HISTORY: Past Surgical History  Procedure Laterality Date  . Knee surgery  1995    right  . Foot surgery  2011    rt  . Tubal ligation    . Uterine fibroid surgery  2005    ablation  . Breast lumpectomy with sentinel lymph node biopsy Left 06/11/2014     Procedure: BREAST LUMPECTOMY WITH SENTINEL LYMPH NODE BX, POSSIBLE AXILLARY LYMPH NODE DISSECTION;  Surgeon: Stark Klein, MD;  Location: [place];  Service: General;  Laterality: Left;  . Portacath placement N/A 07/02/2014    Procedure: INSERTION PORT-A-CATH;  Surgeon: Stark Klein, MD;  Location: [place];  Service: General;  Laterality: N/A;  . Incision and drainage Left 07/02/2014    Procedure: Drainage left breast seroma;  Surgeon: Stark Klein, MD;  Location: [place];  Service: General;  Laterality: Left;  . Evacuation breast hematoma Left 07/10/2014    Procedure: EVACUATION OF LEFT BREAST HEMATOMA ;  Surgeon: Stark Klein, MD;  Location: [place];  Service: General;  Laterality: Left;  . Incision and drainage abscess N/A 11/12/2014    Procedure: ASPIRATION AND INCISION AND DRAINAGE LEFT BREAST ABSCESS;  Surgeon: Michael Boston, MD;  Location: [facility];  Service: General;  Laterality: N/A;    FAMILY HISTORY Family History  Problem Relation Age of Onset  . Breast cancer Mother 42    currently 1; TAH/BSO d/t ?cervical ca at 29  . Breast cancer Maternal Aunt     dx 21s; currently in her late 75s  . Lung cancer Father     deceased 25  . Diabetes Neg Hx    the patient's father died at the age of 96 from lung cancer. The patient's mother was diagnosed with breast cancer at the age of 33. She survives. The patient had 2 brothers, and 2 sisters. One brother may have a diagnosis of "stomach cancer"  GYNECOLOGIC HISTORY:  No LMP recorded. Patient has had an ablation. Menarche age 59 which is also when the patient first had a child. She is GX P3. She stopped having periods after laser ablation in 2005.  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 She has lived with her daughter Laura Matthews and her 4 children who are 6, 8, 5, and 4. 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 More recently the patient is living  in a shelter. The patient's son Laura Matthews is a fast food cook in [place] and the patient's daughter Laura Matthews works as a Scientist, water quality, also in [place]. The patient has 7 grandchildren. She is not a church attender    ADVANCED DIRECTIVES: Not in place; this was discussed with the patient on her first visit, 05/21/2014, and she indicated she plans to name her daughter Laura Matthews as her healthcare power of attorney   HEALTH MAINTENANCE: History  Substance Use Topics  . Smoking status: Current Every Day Smoker -- 0.25 packs/day    Types: Cigarettes  . Smokeless tobacco: Never Used  . Alcohol Use: No     Comment: Percoset,Vicodin, Oxycotin     Colonoscopy:  PAP:  Bone density:  Lipid panel:  Allergies  Allergen Reactions  . Hydrocodone Itching and Nausea Only    Current Outpatient Prescriptions  Medication Sig Dispense Refill  . blood glucose meter kit and supplies Dispense based on patient and insurance preference. Use up to four times  daily as directed. (FOR ICD-9 250.00, 250.01). 1 each 0  . glipiZIDE (GLUCOTROL) 5 MG tablet Take 1 tablet (5 mg total) by mouth 2 (two) times daily before a meal. 60 tablet 3  . Insulin Glargine (LANTUS) 100 UNIT/ML Solostar Pen Inject 15 Units into the skin daily at 10 pm. 15 mL 11  . lidocaine-prilocaine (EMLA) cream Apply 1 application topically as needed. 30 g 0  . LORazepam (ATIVAN) 0.5 MG tablet Take 1 tablet (0.5 mg total) by mouth every 6 (six) hours as needed (Nausea or vomiting). 30 tablet 0  . metFORMIN (GLUCOPHAGE-XR) 500 MG 24 hr tablet Take 1 tablet (500 mg total) by mouth 2 (two) times daily with a meal. 60 tablet 3  . omeprazole (PRILOSEC) 40 MG capsule Take 1 capsule (40 mg total) by mouth at bedtime. 30 capsule 6  . ranitidine (ZANTAC) 150 MG tablet Take 1 tablet (150 mg total) by mouth 2 (two) times daily. (Patient taking differently: Take 150 mg by mouth at bedtime as needed for heartburn (heartburn). ) 60 tablet 0  . Alum & Mag  Hydroxide-Simeth (MAGIC MOUTHWASH) SOLN Take 5 mLs by mouth 4 (four) times daily as needed for mouth pain.    Marland Kitchen bismuth subsalicylate (PEPTO BISMOL) 262 MG/15ML suspension Take 30 mLs by mouth every 6 (six) hours as needed for indigestion (indigestion).    Marland Kitchen dexamethasone (DECADRON) 4 MG tablet TAKE 2 TABLETS DAILY ON THE DAY AFTER CHEMO THEN 2 TABLETS 2 TIMES DAILY FOR 2 DAYS WITH FOOD 30 tablet 0  . fluconazole (DIFLUCAN) 100 MG tablet Take 1 tablet (100 mg total) by mouth daily. (Patient not taking: Reported on 11/12/2014) 15 tablet 2  . ibuprofen (ADVIL,MOTRIN) 200 MG tablet Take 1 tablet (200 mg total) by mouth every 6 (six) hours as needed for moderate pain. (Patient not taking: Reported on 12/22/2014) 30 tablet 0  . nicotine (NICODERM CQ) 21 mg/24hr patch Place 1 patch (21 mg total) onto the skin daily. (Patient not taking: Reported on 11/12/2014) 28 patch 1  . ondansetron (ZOFRAN ODT) 8 MG disintegrating tablet Take 1 tablet (8 mg total) by mouth every 8 (eight) hours as needed for nausea or vomiting. 20 tablet 0  . oxyCODONE-acetaminophen (PERCOCET) 7.5-325 MG per tablet Take 1 tablet by mouth every 4 (four) hours as needed for pain. 30 tablet 0   No current facility-administered medications for this visit.   Facility-Administered Medications Ordered in Other Visits  Medication Dose Route Frequency Provider Last Rate Last Dose  . sodium chloride 0.9 % injection 10 mL  10 mL Intracatheter PRN Chauncey Cruel, MD   10 mL at 07/29/14 1704    OBJECTIVE: Middle-aged [place] American woman who appears stated age 34 Vitals:   12/22/14 1328  BP: 136/73  Pulse: 83  Temp: 98.4 F (36.9 C)  Resp: 18     Body mass index is 26.87 kg/(m^2).    ECOG FS:1 - Symptomatic but completely ambulatory  Skin: warm, dry  HEENT: sclerae anicteric, conjunctivae pink, oropharynx clear. No thrush or mucositis.  Lymph Nodes: No cervical or supraclavicular lymphadenopathy  Lungs: clear to auscultation  bilaterally, no rales, wheezes, or rhonci  Heart: regular rate and rhythm  Abdomen: round, soft, non tender, positive bowel sounds  Musculoskeletal: No focal spinal tenderness, trace lymphedema to left upper extremity Neuro: non focal, well oriented, positive affect  Breast: left breast larger than right, mildly tender with palpation, erythema resolving.   LAB RESULTS:  CMP  Component Value Date/Time   NA 136 11/13/2014 1010   NA 138 10/27/2014 1403   K 4.2 11/13/2014 1010   K 3.8 10/27/2014 1403   CL 101 11/13/2014 1010   CO2 29 11/13/2014 1010   CO2 30* 10/27/2014 1403   GLUCOSE 191* 11/13/2014 1010   GLUCOSE 201* 10/27/2014 1403   BUN 12 11/13/2014 1010   BUN 14.7 10/27/2014 1403   CREATININE 0.46* 11/13/2014 1010   CREATININE 0.7 10/27/2014 1403   CALCIUM 8.5 11/13/2014 1010   CALCIUM 9.2 10/27/2014 1403   PROT 6.9 10/27/2014 1403   PROT 6.5 09/17/2014 0700   ALBUMIN 3.8 10/27/2014 1403   ALBUMIN 3.2* 09/17/2014 0700   AST 15 10/27/2014 1403   AST 11 09/17/2014 0700   ALT 24 10/27/2014 1403   ALT 20 09/17/2014 0700   ALKPHOS 78 10/27/2014 1403   ALKPHOS 131* 09/17/2014 0700   BILITOT 0.22 10/27/2014 1403   BILITOT <0.2* 09/17/2014 0700   GFRNONAA >90 11/13/2014 1010   GFRAA >90 11/13/2014 1010    I No results found for: SPEP  Lab Results  Component Value Date   WBC 7.5 12/22/2014   NEUTROABS 4.8 12/22/2014   HGB 10.6* 12/22/2014   HCT 33.3* 12/22/2014   MCV 91.5 12/22/2014   PLT 340 12/22/2014      Chemistry      Component Value Date/Time   NA 136 11/13/2014 1010   NA 138 10/27/2014 1403   K 4.2 11/13/2014 1010   K 3.8 10/27/2014 1403   CL 101 11/13/2014 1010   CO2 29 11/13/2014 1010   CO2 30* 10/27/2014 1403   BUN 12 11/13/2014 1010   BUN 14.7 10/27/2014 1403   CREATININE 0.46* 11/13/2014 1010   CREATININE 0.7 10/27/2014 1403      Component Value Date/Time   CALCIUM 8.5 11/13/2014 1010   CALCIUM 9.2 10/27/2014 1403   ALKPHOS 78  10/27/2014 1403   ALKPHOS 131* 09/17/2014 0700   AST 15 10/27/2014 1403   AST 11 09/17/2014 0700   ALT 24 10/27/2014 1403   ALT 20 09/17/2014 0700   BILITOT 0.22 10/27/2014 1403   BILITOT <0.2* 09/17/2014 0700       No results found for: LABCA2  No components found for: LABCA125  No results for input(s): INR in the last 168 hours.  Urinalysis    Component Value Date/Time   COLORURINE YELLOW 06/03/2014 1050   APPEARANCEUR CLEAR 06/03/2014 1050   LABSPEC >1.030* 06/03/2014 1050   PHURINE 6.0 06/03/2014 1050   GLUCOSEU NEGATIVE 06/03/2014 1050   HGBUR SMALL* 06/03/2014 1050   BILIRUBINUR SMALL* 06/03/2014 1050   KETONESUR NEGATIVE 06/03/2014 1050   PROTEINUR NEGATIVE 06/03/2014 1050   UROBILINOGEN 0.2 06/03/2014 1050   NITRITE NEGATIVE 06/03/2014 1050   LEUKOCYTESUR TRACE* 06/03/2014 1050    STUDIES: No results found.   ASSESSMENT: 49 y.o. [place] woman status post left breast upper outer quadrant biopsy 05/09/2014 for a clinicalT1c N1, stage IIA invasive ductal carcinoma, grade 2, estrogen and progesterone receptor positive, HER-2 nonamplified, with an MIB-1 of 80%  (1) patient met with genetics counselor 05/29/2014 but decided against genetics testing   (2) tobacco abuse. The patient has been strongly urged to discontinue smoking. She was prescribed a nicotine patch 09/02/2014  (3) status post left lumpectomy and axillary lymph node dissection 06/11/2014 for a pT1c pN3, stage IIIC invasive ductal carcinoma, grade 3, with repeat HER-2 negative  (4) adjuvant chemotherapy to consist of doxorubicin and cyclophosphamide in dose dense  fashion x4, with Neulasta support, started 07/29/2014, to be followed by paclitaxel weekly x12  (5) adjuvant radiation to follow chemotherapy  (6) anti-estrogens for 5-10 years to follow radiation  PLAN: Kayti is in good spirits today. The CBC was reviewed in detail and was entirely stable. The CMET was not yet available to review. She  will proceed with cycle 5 of paclitaxel today as planned.   I suggested she go back to wearing a compression bra to minimize the swelling to her left breast. I refilled her percocet during this visit.   Laxmi will return next week for labs, a follow up visit, and cycle 6 of treatment. She understands and agrees with this plan. She knows the goal of treatment in her case is cure. She has been encouraged to call with any issues that might arise before her next visit here.   Laura Duster, NP 12/22/2014

## 2014-12-22 NOTE — Progress Notes (Signed)
Met with pt to access needs. Pt relate she is doing well. She does have concerns about her left breast swelling.  Pt relate that no fluid was found during last surgery 2 wks ago. Informed pt to discuss concerns with Ezra Sites, NP. Gave pt positive reinforcement for making appt. Encourage pt to call with needs. Received verbal understanding.

## 2014-12-23 ENCOUNTER — Other Ambulatory Visit: Payer: Medicaid Other

## 2014-12-29 ENCOUNTER — Encounter: Payer: Self-pay | Admitting: Nurse Practitioner

## 2014-12-29 ENCOUNTER — Other Ambulatory Visit (HOSPITAL_BASED_OUTPATIENT_CLINIC_OR_DEPARTMENT_OTHER): Payer: Medicaid Other

## 2014-12-29 ENCOUNTER — Other Ambulatory Visit: Payer: Self-pay | Admitting: Nurse Practitioner

## 2014-12-29 ENCOUNTER — Telehealth: Payer: Self-pay | Admitting: *Deleted

## 2014-12-29 ENCOUNTER — Other Ambulatory Visit: Payer: Self-pay | Admitting: *Deleted

## 2014-12-29 ENCOUNTER — Ambulatory Visit (HOSPITAL_BASED_OUTPATIENT_CLINIC_OR_DEPARTMENT_OTHER): Payer: Medicaid Other | Admitting: Nurse Practitioner

## 2014-12-29 ENCOUNTER — Ambulatory Visit (HOSPITAL_BASED_OUTPATIENT_CLINIC_OR_DEPARTMENT_OTHER): Payer: Medicaid Other

## 2014-12-29 ENCOUNTER — Telehealth: Payer: Self-pay | Admitting: Nurse Practitioner

## 2014-12-29 VITALS — BP 147/73 | HR 81 | Temp 98.3°F | Resp 18 | Ht 70.0 in | Wt 186.7 lb

## 2014-12-29 DIAGNOSIS — C50412 Malignant neoplasm of upper-outer quadrant of left female breast: Secondary | ICD-10-CM

## 2014-12-29 DIAGNOSIS — C773 Secondary and unspecified malignant neoplasm of axilla and upper limb lymph nodes: Secondary | ICD-10-CM

## 2014-12-29 DIAGNOSIS — R110 Nausea: Secondary | ICD-10-CM

## 2014-12-29 DIAGNOSIS — Z5111 Encounter for antineoplastic chemotherapy: Secondary | ICD-10-CM

## 2014-12-29 DIAGNOSIS — E119 Type 2 diabetes mellitus without complications: Secondary | ICD-10-CM

## 2014-12-29 DIAGNOSIS — C50812 Malignant neoplasm of overlapping sites of left female breast: Secondary | ICD-10-CM

## 2014-12-29 DIAGNOSIS — C50912 Malignant neoplasm of unspecified site of left female breast: Secondary | ICD-10-CM

## 2014-12-29 DIAGNOSIS — C50911 Malignant neoplasm of unspecified site of right female breast: Secondary | ICD-10-CM

## 2014-12-29 DIAGNOSIS — I890 Lymphedema, not elsewhere classified: Secondary | ICD-10-CM

## 2014-12-29 LAB — CBC WITH DIFFERENTIAL/PLATELET
BASO%: 0.6 % (ref 0.0–2.0)
Basophils Absolute: 0.1 10*3/uL (ref 0.0–0.1)
EOS%: 1.7 % (ref 0.0–7.0)
Eosinophils Absolute: 0.2 10*3/uL (ref 0.0–0.5)
HEMATOCRIT: 39.2 % (ref 34.8–46.6)
HGB: 12.5 g/dL (ref 11.6–15.9)
LYMPH%: 20.7 % (ref 14.0–49.7)
MCH: 28.6 pg (ref 25.1–34.0)
MCHC: 32 g/dL (ref 31.5–36.0)
MCV: 89.5 fL (ref 79.5–101.0)
MONO#: 0.4 10*3/uL (ref 0.1–0.9)
MONO%: 4.3 % (ref 0.0–14.0)
NEUT%: 72.7 % (ref 38.4–76.8)
NEUTROS ABS: 6.4 10*3/uL (ref 1.5–6.5)
Platelets: 372 10*3/uL (ref 145–400)
RBC: 4.38 10*6/uL (ref 3.70–5.45)
RDW: 15.9 % — ABNORMAL HIGH (ref 11.2–14.5)
WBC: 8.8 10*3/uL (ref 3.9–10.3)
lymph#: 1.8 10*3/uL (ref 0.9–3.3)

## 2014-12-29 LAB — COMPREHENSIVE METABOLIC PANEL (CC13)
ALT: 28 U/L (ref 0–55)
ANION GAP: 8 meq/L (ref 3–11)
AST: 24 U/L (ref 5–34)
Albumin: 4.1 g/dL (ref 3.5–5.0)
Alkaline Phosphatase: 61 U/L (ref 40–150)
BILIRUBIN TOTAL: 0.23 mg/dL (ref 0.20–1.20)
BUN: 10.2 mg/dL (ref 7.0–26.0)
CHLORIDE: 103 meq/L (ref 98–109)
CO2: 27 mEq/L (ref 22–29)
Calcium: 9.5 mg/dL (ref 8.4–10.4)
Creatinine: 0.7 mg/dL (ref 0.6–1.1)
Glucose: 169 mg/dl — ABNORMAL HIGH (ref 70–140)
POTASSIUM: 4.4 meq/L (ref 3.5–5.1)
Sodium: 138 mEq/L (ref 136–145)
TOTAL PROTEIN: 7.1 g/dL (ref 6.4–8.3)

## 2014-12-29 MED ORDER — OMEPRAZOLE 40 MG PO CPDR: 40.0000 mg | DELAYED_RELEASE_CAPSULE | Freq: Every day | ORAL | Status: DC

## 2014-12-29 MED ORDER — PROCHLORPERAZINE MALEATE 10 MG PO TABS: 10.0000 mg | ORAL_TABLET | Freq: Four times a day (QID) | ORAL | Status: DC | PRN

## 2014-12-29 MED ORDER — DEXAMETHASONE SODIUM PHOSPHATE 10 MG/ML IJ SOLN
INTRAMUSCULAR | Status: AC
  Filled 2014-12-29: qty 1

## 2014-12-29 MED ORDER — DIPHENHYDRAMINE HCL 50 MG/ML IJ SOLN
25.0000 mg | Freq: Once | INTRAMUSCULAR | Status: AC
  Administered 2014-12-29: 25 mg via INTRAVENOUS

## 2014-12-29 MED ORDER — PACLITAXEL CHEMO INJECTION 300 MG/50ML
80.0000 mg/m2 | Freq: Once | INTRAVENOUS | Status: AC
  Administered 2014-12-29: 162 mg via INTRAVENOUS
  Filled 2014-12-29: qty 27

## 2014-12-29 MED ORDER — DEXAMETHASONE SODIUM PHOSPHATE 10 MG/ML IJ SOLN
4.0000 mg | Freq: Once | INTRAMUSCULAR | Status: AC
  Administered 2014-12-29: 4 mg via INTRAVENOUS

## 2014-12-29 MED ORDER — ONDANSETRON 8 MG/NS 50 ML IVPB
INTRAVENOUS | Status: AC
  Filled 2014-12-29: qty 8

## 2014-12-29 MED ORDER — SODIUM CHLORIDE 0.9 % IV SOLN
Freq: Once | INTRAVENOUS | Status: AC
  Administered 2014-12-29: 14:00:00 via INTRAVENOUS

## 2014-12-29 MED ORDER — DIPHENHYDRAMINE HCL 50 MG/ML IJ SOLN
INTRAMUSCULAR | Status: AC
  Filled 2014-12-29: qty 1

## 2014-12-29 MED ORDER — SODIUM CHLORIDE 0.9 % IJ SOLN
10.0000 mL | INTRAMUSCULAR | Status: DC | PRN
  Administered 2014-12-29: 10 mL
  Filled 2014-12-29: qty 10

## 2014-12-29 MED ORDER — ONDANSETRON 8 MG/50ML IVPB (CHCC)
8.0000 mg | Freq: Once | INTRAVENOUS | Status: AC
  Administered 2014-12-29: 8 mg via INTRAVENOUS

## 2014-12-29 MED ORDER — FAMOTIDINE IN NACL 20-0.9 MG/50ML-% IV SOLN
20.0000 mg | Freq: Once | INTRAVENOUS | Status: AC
  Administered 2014-12-29: 20 mg via INTRAVENOUS

## 2014-12-29 MED ORDER — FAMOTIDINE IN NACL 20-0.9 MG/50ML-% IV SOLN
INTRAVENOUS | Status: AC
  Filled 2014-12-29: qty 50

## 2014-12-29 MED ORDER — HEPARIN SOD (PORK) LOCK FLUSH 100 UNIT/ML IV SOLN
500.0000 [IU] | Freq: Once | INTRAVENOUS | Status: AC | PRN
  Administered 2014-12-29: 500 [IU]
  Filled 2014-12-29: qty 5

## 2014-12-29 NOTE — Progress Notes (Signed)
Laura Matthews  [phone] [phone]     ID: Laura Matthews DOB: 08/08/1966  MR#: [id number]  URK#:[id number]  Patient Care Team: [name], MD as PCP - General (Family Medicine) Stark Klein, MD as Consulting Physician (General Surgery) Chauncey Cruel, MD as Consulting Physician (Oncology) Thea Silversmith, MD as Consulting Physician (Radiation Oncology) [facility] Bunnie Pion, NP as Nurse Practitioner (Nurse Practitioner)  CHIEF COMPLAINT: Stage III left breast cancer  CURRENT TREATMENT:adjuvant chemotherapy  BREAST CANCER HISTORY: From the original intake note:  The patient herself palpated a mass in her left breast and brought it to the attention of [employer] NP 04/22/2014. She palpated a large nontender mass at the 4:00 position as well as a small tender mass in the 6:00 position of the left breast. The patient was set up for imaging at the breast Center where on 05/05/2014 she underwent bilateral diagnostic mammography and left breast ultrasonography. The mammogram showed an irregular mass in the outer left breast with no other suspicious findings. This was firm and fixed at the 3:00 position approximately 10 cm from the nipple. Ultrasound showed a hypoechoic irregular solid mass in this area, measuring 1.8 cm. Ultrasound of the left axilla showed 2 suspicious level I lymph nodes, the largest measuring 1 cm, with a thickened pole. A smaller lymph node measured 0.6 cm but had no visible fatty hilum.  On 05/09/2014 the patient underwent biopsy of the left breast mass (she refused biopsy of the left axilla) which showed ([id number]) and invasive ductal carcinoma, grade 2, estrogen receptor positive at 100%, with strong staining intensity, progesterone receptor 80% positive, with weak staining intensity, with an MIB-1 of 80%, and no HER-2 amplification.  On 05/16/2014 the patient underwent bilateral breast MRI. This showed a 1.7 cm irregular enhancing  mass at the 3:00 position of the left breast associated with the biopsy cleft. There were 3 level I left axillary lymph nodes with thickened cortices. The largest measured 1.3 cm. There were no other findings of concern.  Her subsequent history is as detailed below.  INTERVAL HISTORY: Laura Matthews returns for follow up of her breast cancer. Today she is due for cycle 6 of paclitaxel. She has been wearing her compression bra from surgery, but the ymphedema issues to her left breast continue. She has already used up almost all of the percocet prescribed last week for her pain. Admittedly, she has not been using the home compression device to her left arm, given to her by physical therapy and this might help some.   REVIEW OF SYSTEMS:  [name] denies fevers or chills. She has been nauseous this past week and didn't have any zofran so she has been taking dexamethasone pills instead. She described urinary frequency, and knows this is a sign of uncontrolled blood sugars, but she insists they are fine when the home health aide comes to check it. This week she has experienced blurred vision as well.  She has no shortness of breath, chest pain, palpitations, or fatigue. She denies mouth sores, rashes, or peripheral neuropathy symptoms. A detailed review of systems was otherwise negative.   PAST MEDICAL HISTORY: Past Medical History  Diagnosis Date  . Mental disorder     depression-  . Arthritis   . Hot flashes   . Head ache   . Malignant neoplasm of breast (female), unspecified site   . Family history of malignant neoplasm of breast   . Depression     pt denies   .  Anemia     low iron  . Newly diagnosed diabetes 11/12/2014    PAST SURGICAL HISTORY: Past Surgical History  Procedure Laterality Date  . Knee surgery  1995    right  . Foot surgery  2011    rt  . Tubal ligation    . Uterine fibroid surgery  2005    ablation  . Breast lumpectomy with sentinel lymph node biopsy Left 06/11/2014     Procedure: BREAST LUMPECTOMY WITH SENTINEL LYMPH NODE BX, POSSIBLE AXILLARY LYMPH NODE DISSECTION;  Surgeon: Stark Klein, MD;  Location: [place];  Service: General;  Laterality: Left;  . Portacath placement N/A 07/02/2014    Procedure: INSERTION PORT-A-CATH;  Surgeon: Stark Klein, MD;  Location: [place];  Service: General;  Laterality: N/A;  . Incision and drainage Left 07/02/2014    Procedure: Drainage left breast seroma;  Surgeon: Stark Klein, MD;  Location: [place];  Service: General;  Laterality: Left;  . Evacuation breast hematoma Left 07/10/2014    Procedure: EVACUATION OF LEFT BREAST HEMATOMA ;  Surgeon: Stark Klein, MD;  Location: [place];  Service: General;  Laterality: Left;  . Incision and drainage abscess N/A 11/12/2014    Procedure: ASPIRATION AND INCISION AND DRAINAGE LEFT BREAST ABSCESS;  Surgeon: Michael Boston, MD;  Location: [facility];  Service: General;  Laterality: N/A;    FAMILY HISTORY Family History  Problem Relation Age of Onset  . Breast cancer Mother 67    currently 62; TAH/BSO d/t ?cervical ca at 75  . Breast cancer Maternal Aunt     dx 41s; currently in her late 3s  . Lung cancer Father     deceased 4  . Diabetes Neg Hx    the patient's father died at the age of 60 from lung cancer. The patient's mother was diagnosed with breast cancer at the age of 48. She survives. The patient had 2 brothers, and 2 sisters. One brother may have a diagnosis of "stomach cancer"  GYNECOLOGIC HISTORY:  No LMP recorded. Patient has had an ablation. Menarche age 35 which is also when the patient first had a child. She is GX P3. She stopped having periods after laser ablation in 2005.  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 She has lived with her daughter [name] and her 4 children who are 76, 8, 5, and 4. 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 More recently the patient is living  in a shelter. The patient's son Laura Matthews is a fast food cook in [place] and the patient's daughter Laura Matthews works as a Scientist, water quality, also in [place]. The patient has 7 grandchildren. She is not a church attender    ADVANCED DIRECTIVES: Not in place; this was discussed with the patient on her first visit, 05/21/2014, and she indicated she plans to name her daughter [name] as her healthcare power of attorney   HEALTH MAINTENANCE: History  Substance Use Topics  . Smoking status: Current Every Day Smoker -- 0.25 packs/day    Types: Cigarettes  . Smokeless tobacco: Never Used  . Alcohol Use: No     Comment: Percoset,Vicodin, Oxycotin     Colonoscopy:  PAP:  Bone density:  Lipid panel:  Allergies  Allergen Reactions  . Hydrocodone Itching and Nausea Only    Current Outpatient Prescriptions  Medication Sig Dispense Refill  . blood glucose meter kit and supplies Dispense based on patient and insurance preference.  Use up to four times daily as directed. (FOR ICD-9 250.00, 250.01). 1 each 0  . dexamethasone (DECADRON) 4 MG tablet TAKE 2 TABLETS DAILY ON THE DAY AFTER CHEMO THEN 2 TABLETS 2 TIMES DAILY FOR 2 DAYS WITH FOOD 30 tablet 0  . glipiZIDE (GLUCOTROL) 5 MG tablet Take 1 tablet (5 mg total) by mouth 2 (two) times daily before a meal. 60 tablet 3  . Insulin Glargine (LANTUS) 100 UNIT/ML Solostar Pen Inject 15 Units into the skin daily at 10 pm. 15 mL 11  . lidocaine-prilocaine (EMLA) cream Apply 1 application topically as needed. 30 g 0  . metFORMIN (GLUCOPHAGE-XR) 500 MG 24 hr tablet Take 1 tablet (500 mg total) by mouth 2 (two) times daily with a meal. 60 tablet 3  . ondansetron (ZOFRAN ODT) 8 MG disintegrating tablet Take 1 tablet (8 mg total) by mouth every 8 (eight) hours as needed for nausea or vomiting. 20 tablet 0  . oxyCODONE-acetaminophen (PERCOCET) 7.5-325 MG per tablet Take 1 tablet by mouth every 4 (four) hours as needed for pain. 30 tablet 0  . Alum & Mag Hydroxide-Simeth  (MAGIC MOUTHWASH) SOLN Take 5 mLs by mouth 4 (four) times daily as needed for mouth pain.    Marland Kitchen bismuth subsalicylate (PEPTO BISMOL) 262 MG/15ML suspension Take 30 mLs by mouth every 6 (six) hours as needed for indigestion (indigestion).    . fluconazole (DIFLUCAN) 100 MG tablet Take 1 tablet (100 mg total) by mouth daily. (Patient not taking: Reported on 11/12/2014) 15 tablet 2  . ibuprofen (ADVIL,MOTRIN) 200 MG tablet Take 1 tablet (200 mg total) by mouth every 6 (six) hours as needed for moderate pain. (Patient not taking: Reported on 12/22/2014) 30 tablet 0  . LORazepam (ATIVAN) 0.5 MG tablet Take 1 tablet (0.5 mg total) by mouth every 6 (six) hours as needed (Nausea or vomiting). (Patient not taking: Reported on 12/29/2014) 30 tablet 0  . nicotine (NICODERM CQ) 21 mg/24hr patch Place 1 patch (21 mg total) onto the skin daily. (Patient not taking: Reported on 11/12/2014) 28 patch 1  . omeprazole (PRILOSEC) 40 MG capsule Take 1 capsule (40 mg total) by mouth at bedtime. 30 capsule 4  . prochlorperazine (COMPAZINE) 10 MG tablet Take 1 tablet (10 mg total) by mouth every 6 (six) hours as needed for nausea or vomiting. 30 tablet 1  . ranitidine (ZANTAC) 150 MG tablet Take 1 tablet (150 mg total) by mouth 2 (two) times daily. (Patient not taking: Reported on 12/29/2014) 60 tablet 0   No current facility-administered medications for this visit.   Facility-Administered Medications Ordered in Other Visits  Medication Dose Route Frequency Provider Last Rate Last Dose  . sodium chloride 0.9 % injection 10 mL  10 mL Intracatheter PRN Chauncey Cruel, MD   10 mL at 07/29/14 1704    OBJECTIVE: Middle-aged [place] American woman who appears stated age 49 Vitals:   12/29/14 1258  BP: 147/73  Pulse: 81  Temp: 98.3 F (36.8 C)  Resp: 18     Body mass index is 26.79 kg/(m^2).    ECOG FS:1 - Symptomatic but completely ambulatory  Skin: warm, dry  HEENT: sclerae anicteric, conjunctivae pink, oropharynx  clear. No thrush or mucositis.  Lymph Nodes: No cervical or supraclavicular lymphadenopathy  Lungs: clear to auscultation bilaterally, no rales, wheezes, or rhonci  Heart: regular rate and rhythm  Abdomen: round, soft, non tender, positive bowel sounds  Musculoskeletal: No focal spinal tenderness, trace lymphedema to left upper extremity  Neuro: non focal, well oriented, positive affect  Breast: deferred  LAB RESULTS:  CMP     Component Value Date/Time   NA 138 12/29/2014 1158   NA 136 11/13/2014 1010   K 4.4 12/29/2014 1158   K 4.2 11/13/2014 1010   CL 101 11/13/2014 1010   CO2 27 12/29/2014 1158   CO2 29 11/13/2014 1010   GLUCOSE 169* 12/29/2014 1158   GLUCOSE 191* 11/13/2014 1010   BUN 10.2 12/29/2014 1158   BUN 12 11/13/2014 1010   CREATININE 0.7 12/29/2014 1158   CREATININE 0.46* 11/13/2014 1010   CALCIUM 9.5 12/29/2014 1158   CALCIUM 8.5 11/13/2014 1010   PROT 7.1 12/29/2014 1158   PROT 6.5 09/17/2014 0700   ALBUMIN 4.1 12/29/2014 1158   ALBUMIN 3.2* 09/17/2014 0700   AST 24 12/29/2014 1158   AST 11 09/17/2014 0700   ALT 28 12/29/2014 1158   ALT 20 09/17/2014 0700   ALKPHOS 61 12/29/2014 1158   ALKPHOS 131* 09/17/2014 0700   BILITOT 0.23 12/29/2014 1158   BILITOT <0.2* 09/17/2014 0700   GFRNONAA >90 11/13/2014 1010   GFRAA >90 11/13/2014 1010    I No results found for: SPEP  Lab Results  Component Value Date   WBC 8.8 12/29/2014   NEUTROABS 6.4 12/29/2014   HGB 12.5 12/29/2014   HCT 39.2 12/29/2014   MCV 89.5 12/29/2014   PLT 372 12/29/2014      Chemistry      Component Value Date/Time   NA 138 12/29/2014 1158   NA 136 11/13/2014 1010   K 4.4 12/29/2014 1158   K 4.2 11/13/2014 1010   CL 101 11/13/2014 1010   CO2 27 12/29/2014 1158   CO2 29 11/13/2014 1010   BUN 10.2 12/29/2014 1158   BUN 12 11/13/2014 1010   CREATININE 0.7 12/29/2014 1158   CREATININE 0.46* 11/13/2014 1010      Component Value Date/Time   CALCIUM 9.5 12/29/2014 1158    CALCIUM 8.5 11/13/2014 1010   ALKPHOS 61 12/29/2014 1158   ALKPHOS 131* 09/17/2014 0700   AST 24 12/29/2014 1158   AST 11 09/17/2014 0700   ALT 28 12/29/2014 1158   ALT 20 09/17/2014 0700   BILITOT 0.23 12/29/2014 1158   BILITOT <0.2* 09/17/2014 0700       No results found for: LABCA2  No components found for: LABCA125  No results for input(s): INR in the last 168 hours.  Urinalysis    Component Value Date/Time   COLORURINE YELLOW 06/03/2014 1050   APPEARANCEUR CLEAR 06/03/2014 1050   LABSPEC >1.030* 06/03/2014 1050   PHURINE 6.0 06/03/2014 1050   GLUCOSEU NEGATIVE 06/03/2014 1050   HGBUR SMALL* 06/03/2014 1050   BILIRUBINUR SMALL* 06/03/2014 1050   KETONESUR NEGATIVE 06/03/2014 1050   PROTEINUR NEGATIVE 06/03/2014 1050   UROBILINOGEN 0.2 06/03/2014 1050   NITRITE NEGATIVE 06/03/2014 1050   LEUKOCYTESUR TRACE* 06/03/2014 1050    STUDIES: No results found.   ASSESSMENT: 49 y.o. [place] woman status post left breast upper outer quadrant biopsy 05/09/2014 for a clinicalT1c N1, stage IIA invasive ductal carcinoma, grade 2, estrogen and progesterone receptor positive, HER-2 nonamplified, with an MIB-1 of 80%  (1) patient met with genetics counselor 05/29/2014 but decided against genetics testing   (2) tobacco abuse. The patient has been strongly urged to discontinue smoking. She was prescribed a nicotine patch 09/02/2014  (3) status post left lumpectomy and axillary lymph node dissection 06/11/2014 for a pT1c pN3, stage IIIC invasive ductal carcinoma,  grade 3, with repeat HER-2 negative  (4) adjuvant chemotherapy to consist of doxorubicin and cyclophosphamide in dose dense fashion x4, with Neulasta support, started 07/29/2014, to be followed by paclitaxel weekly x12  (5) adjuvant radiation to follow chemotherapy  (6) anti-estrogens for 5-10 years to follow radiation  PLAN: Rhanda is doing well today. The labs were reviewed in detail and were stable. She will  proceed with cycle 6 of paclitaxel as planned today.   There has been some confusion regarding her antiemetic schedule. She continued to take dexamethasone instead of the combination of zofran and compazine for her nausea. This, along with the fact that steroids would aggravate her diabetes, is like the reason for the blurred vision and uncontrolled sugars as of late. She has been educated to stop this med and use the previously mentioned antiemetics.   For pain, we reviewed the use of her compression machine at home for her left lymphedema issues. This may help more than anything for her pain. She understands that we will not be prescribing her percocet more frequently than every 2 weeks.   Judithe will return next week for labs and cycle 7 of treatment. She understands and agrees with this plan. She knows the goal of treatment in her case is cure. She has been encouraged to call with any issues that might arise before her next visit here.    Marcelino Duster, NP 12/29/2014

## 2014-12-29 NOTE — Telephone Encounter (Signed)
per pof ot sch pt appt-sent MW email to sch pt trmt-gave pt copy of sch-

## 2014-12-29 NOTE — Telephone Encounter (Signed)
Per staff message and POF I have scheduled appts. Advised scheduler of appts. JMW  

## 2014-12-29 NOTE — Patient Instructions (Signed)
[facility] Discharge Instructions for Patients Receiving Chemotherapy  Today you received the following chemotherapy agents: Taxol.  To help prevent nausea and vomiting after your treatment, we encourage you to take your nausea medication as prescribed.   If you develop nausea and vomiting that is not controlled by your nausea medication, call the clinic.   BELOW ARE SYMPTOMS THAT SHOULD BE REPORTED IMMEDIATELY:  *FEVER GREATER THAN 100.5 F  *CHILLS WITH OR WITHOUT FEVER  NAUSEA AND VOMITING THAT IS NOT CONTROLLED WITH YOUR NAUSEA MEDICATION  *UNUSUAL SHORTNESS OF BREATH  *UNUSUAL BRUISING OR BLEEDING  TENDERNESS IN MOUTH AND THROAT WITH OR WITHOUT PRESENCE OF ULCERS  *URINARY PROBLEMS  *BOWEL PROBLEMS  UNUSUAL RASH Items with * indicate a potential emergency and should be followed up as soon as possible.  Feel free to call the clinic you have any questions or concerns. The clinic phone number is [phone].    

## 2015-01-01 ENCOUNTER — Emergency Department (HOSPITAL_COMMUNITY)
Admission: EM | Admit: 2015-01-01 | Discharge: 2015-01-01 | Disposition: A | Discharge status: Home / Self Care | Payer: Medicaid Other | Attending: Emergency Medicine | Admitting: Emergency Medicine

## 2015-01-01 ENCOUNTER — Encounter (HOSPITAL_COMMUNITY): Payer: Self-pay | Admitting: *Deleted

## 2015-01-01 DIAGNOSIS — Z794 Long term (current) use of insulin: Secondary | ICD-10-CM | POA: Insufficient documentation

## 2015-01-01 DIAGNOSIS — Z79899 Other long term (current) drug therapy: Secondary | ICD-10-CM | POA: Diagnosis not present

## 2015-01-01 DIAGNOSIS — Z7952 Long term (current) use of systemic steroids: Secondary | ICD-10-CM | POA: Diagnosis not present

## 2015-01-01 DIAGNOSIS — Z862 Personal history of diseases of the blood and blood-forming organs and certain disorders involving the immune mechanism: Secondary | ICD-10-CM | POA: Diagnosis not present

## 2015-01-01 DIAGNOSIS — E119 Type 2 diabetes mellitus without complications: Secondary | ICD-10-CM | POA: Insufficient documentation

## 2015-01-01 DIAGNOSIS — Z8659 Personal history of other mental and behavioral disorders: Secondary | ICD-10-CM | POA: Diagnosis not present

## 2015-01-01 DIAGNOSIS — Z8742 Personal history of other diseases of the female genital tract: Secondary | ICD-10-CM | POA: Diagnosis not present

## 2015-01-01 DIAGNOSIS — M1990 Unspecified osteoarthritis, unspecified site: Secondary | ICD-10-CM | POA: Insufficient documentation

## 2015-01-01 DIAGNOSIS — N644 Mastodynia: Secondary | ICD-10-CM | POA: Insufficient documentation

## 2015-01-01 DIAGNOSIS — Z720 Tobacco use: Secondary | ICD-10-CM | POA: Diagnosis not present

## 2015-01-01 DIAGNOSIS — Z853 Personal history of malignant neoplasm of breast: Secondary | ICD-10-CM | POA: Diagnosis not present

## 2015-01-01 MED ORDER — HYDROMORPHONE HCL 1 MG/ML IJ SOLN: 2.0000 mg | Freq: Once | INTRAMUSCULAR | Status: DC

## 2015-01-01 MED ORDER — HYDROMORPHONE HCL 1 MG/ML IJ SOLN
2.0000 mg | Freq: Once | INTRAMUSCULAR | Status: AC
  Administered 2015-01-01: 2 mg via INTRAMUSCULAR
  Filled 2015-01-01: qty 2

## 2015-01-01 MED ORDER — LIDOCAINE 5 % EX PTCH: 1.0000 | MEDICATED_PATCH | CUTANEOUS | Status: DC

## 2015-01-01 MED ORDER — ONDANSETRON 4 MG PO TBDP
8.0000 mg | ORAL_TABLET | Freq: Once | ORAL | Status: AC
  Administered 2015-01-01: 8 mg via ORAL
  Filled 2015-01-01: qty 2

## 2015-01-01 MED ORDER — NAPROXEN 500 MG PO TBEC: 500.0000 mg | DELAYED_RELEASE_TABLET | Freq: Two times a day (BID) | ORAL | Status: DC

## 2015-01-01 NOTE — ED Notes (Signed)
Pt has had three surgeries on her breast and now they are telling her she has lymph edema to left arm.  PT states left breast pain and is on chemo.  Pt was on pain medication oxycodone but is out.

## 2015-01-01 NOTE — Discharge Instructions (Signed)
Chronic Pain Discharge Instructions  Emergency care providers appreciate that many patients coming to [place] are in severe pain and we wish to address their pain in the safest, most responsible manner.  It is important to recognize however, that the proper treatment of chronic pain differs from that of the pain of injuries and acute illnesses.  Our goal is to provide quality, safe, personalized care and we thank you for giving [place] the opportunity to serve you. The use of narcotics and related agents for chronic pain syndromes may lead to additional physical and psychological problems.  Nearly as many people die from prescription narcotics each year as die from car crashes.  Additionally, this risk is increased if such prescriptions are obtained from a variety of sources.  Therefore, only your primary care physician or a pain management specialist is able to safely treat such syndromes with narcotic medications long-term.    Documentation revealing such prescriptions have been sought from multiple sources may prohibit [place] from providing a refill or different narcotic medication.  Your name may be checked first through the [place].  This database is a record of controlled substance medication prescriptions that the patient has received.  This has been established by [facility] in an effort to eliminate the dangerous, and often life threatening, practice of obtaining multiple prescriptions from different medical providers.   If you have a chronic pain syndrome (i.e. chronic headaches, recurrent back or neck pain, dental pain, abdominal or pelvis pain without a specific diagnosis, or neuropathic pain such as fibromyalgia) or recurrent visits for the same condition without an acute diagnosis, you may be treated with non-narcotics and other non-addictive medicines.  Allergic reactions or negative side effects that may be reported by a patient to such medications will not  typically lead to the use of a narcotic analgesic or other controlled substance as an alternative.   Patients managing chronic pain with a personal physician should have provisions in place for breakthrough pain.  If you are in crisis, you should call your physician.  If your physician directs you to the emergency department, please have the doctor call and speak to our attending physician concerning your care.   When patients come to the Emergency Department (ED) with acute medical conditions in which the Emergency Department physician feels appropriate to prescribe narcotic or sedating pain medication, the physician will prescribe these in very limited quantities.  The amount of these medications will last only until you can see your primary care physician in his/her office.  Any patient who returns to the ED seeking refills should expect only non-narcotic pain medications.   In the event of an acute medical condition exists and the emergency physician feels it is necessary that the patient be given a narcotic or sedating medication -  a responsible adult driver should be present in the room prior to the medication being given by the nurse.   Prescriptions for narcotic or sedating medications that have been lost, stolen or expired will not be refilled in the Emergency Department.    Patients who have chronic pain may receive non-narcotic prescriptions until seen by their primary care physician.  It is every patients personal responsibility to maintain active prescriptions with his or her primary care physician or specialist.  Breast Tenderness Breast tenderness is a common problem for women of all ages. Breast tenderness may cause mild discomfort to severe pain. It has a variety of causes. Your health care provider will find  out the likely cause of your breast tenderness by examining your breasts, asking you about symptoms, and ordering some tests. Breast tenderness usually does not mean you have  breast cancer. HOME CARE INSTRUCTIONS  Breast tenderness often can be handled at home. You can try:  Getting fitted for a new bra that provides more support, especially during exercise.  Wearing a more supportive bra or sports bra while sleeping when your breasts are very tender.  If you have a breast injury, apply ice to the area:  Put ice in a plastic bag.  Place a towel between your skin and the bag.  Leave the ice on for 20 minutes, 2-3 times a day.  If your breasts are too full of milk as a result of breastfeeding, try:  Expressing milk either by hand or with a breast pump.  Applying a warm compress to the breasts for relief.  Taking over-the-counter pain relievers, if approved by your health care provider.  Taking other medicines that your health care provider prescribes. These may include antibiotic medicines or birth control pills. Over the long term, your breast tenderness might be eased if you:  Cut down on caffeine.  Reduce the amount of fat in your diet. Keep a log of the days and times when your breasts are most tender. This will help you and your health care provider find the cause of the tenderness and how to relieve it. Also, learn how to do breast exams at home. This will help you notice if you have an unusual growth or lump that could cause tenderness. SEEK MEDICAL CARE IF:   Any part of your breast is hard, red, and hot to the touch. This could be a sign of infection.  Fluid is coming out of your nipples (and you are not breastfeeding). Especially watch for blood or pus.  You have a fever as well as breast tenderness.  You have a new or painful lump in your breast that remains after your menstrual period ends.  You have tried to take care of the pain at home, but it has not gone away.  Your breast pain is getting worse, or the pain is making it hard to do the things you usually do during your day. Document Released: 09/29/2008 Document Revised:  06/19/2013 Document Reviewed: 05/16/2013 [facility] Patient Information 2015 [place], [place]. This information is not intended to replace advice given to you by your health care provider. Make sure you discuss any questions you have with your health care provider.

## 2015-01-01 NOTE — ED Provider Notes (Signed)
CSN: [id number]     Arrival date & time 01/01/15  1526 History  This chart was scribed for non-physician practitioner, Margarita Mail PA,  working with No att. providers found, by Eustaquio Maize, ED Scribe. This patient was seen in room TR10C/TR10C and the patient's care was started at 4:55 PM.     Chief Complaint  Patient presents with  . Breast Pain   The history is provided by the patient. No language interpreter was used.     HPI Comments: Laura Matthews is a 49 y.o. female with history of breast cancer s/p lumpectomy who presents to the Emergency Department complaining of left breast pain that began approximately 2 months ago. Pt reports that she had first lumpectomy surgery in August 2015. 2 weeks after the surgery, pt could not get the swelling in her breast to go down. After the second surgery, pt had an MRI done and was told she had an abscess in her left breast. She had the abscess drained 11/16/2014. She claims that the pain has not gone away since her surgery. Pt has been told she has chronic lymph edema to left arm after the surgery and that not much can be done about it. She was on Oxycodone for her pain but states that she is out of her prescription. Pt is currently undergoing chemotherapy for her cancer. She denies fever, chills, or any other symptoms.   On 12/17/14 (approximately 15 days ago) pt was prescribed a 20 day supply of 20 mg Oxycodone tablets. 8 days later on 12/25/14, she was prescribed another 5 day supply of Oxycodone 7.5 mg. She admits to taking more than the recommended dose of pain medication. She denies selling medication.   Past Medical History  Diagnosis Date  . Mental disorder     depression-  . Arthritis   . Hot flashes   . Head ache   . Malignant neoplasm of breast (female), unspecified site   . Family history of malignant neoplasm of breast   . Depression     pt denies   . Anemia     low iron  . Newly diagnosed diabetes 11/12/2014   Past Surgical  History  Procedure Laterality Date  . Knee surgery  1995    right  . Foot surgery  2011    rt  . Tubal ligation    . Uterine fibroid surgery  2005    ablation  . Breast lumpectomy with sentinel lymph node biopsy Left 06/11/2014    Procedure: BREAST LUMPECTOMY WITH SENTINEL LYMPH NODE BX, POSSIBLE AXILLARY LYMPH NODE DISSECTION;  Surgeon: Stark Klein, MD;  Location: [facility];  Service: General;  Laterality: Left;  . Portacath placement N/A 07/02/2014    Procedure: INSERTION PORT-A-CATH;  Surgeon: Stark Klein, MD;  Location: [facility];  Service: General;  Laterality: N/A;  . Incision and drainage Left 07/02/2014    Procedure: Drainage left breast seroma;  Surgeon: Stark Klein, MD;  Location: [facility];  Service: General;  Laterality: Left;  . Evacuation breast hematoma Left 07/10/2014    Procedure: EVACUATION OF LEFT BREAST HEMATOMA ;  Surgeon: Stark Klein, MD;  Location: [facility];  Service: General;  Laterality: Left;  . Incision and drainage abscess N/A 11/12/2014    Procedure: ASPIRATION AND INCISION AND DRAINAGE LEFT BREAST ABSCESS;  Surgeon: Michael Boston, MD;  Location: [facility];  Service: General;  Laterality: N/A;   Family History  Problem Relation Age of Onset  .  Breast cancer Mother 11    currently 48; TAH/BSO d/t ?cervical ca at 15  . Breast cancer Maternal Aunt     dx 39s; currently in her late 60s  . Lung cancer Father     deceased 79  . Diabetes Neg Hx    History  Substance Use Topics  . Smoking status: Current Every Day Smoker -- 0.25 packs/day    Types: Cigarettes  . Smokeless tobacco: Never Used  . Alcohol Use: No     Comment: Percoset,Vicodin, Oxycotin   OB History    Gravida Para Term Preterm AB TAB SAB Ectopic Multiple Living   '3 3 3       3     ' Review of Systems  A complete 10 system review of systems was obtained and all systems are negative except as noted in the HPI and PMH.    Allergies   Hydrocodone  Home Medications   Prior to Admission medications   Medication Sig Start Date End Date Taking? Authorizing Provider  Alum & Mag Hydroxide-Simeth (MAGIC MOUTHWASH) SOLN Take 5 mLs by mouth 4 (four) times daily as needed for mouth pain.    Historical Provider, MD  bismuth subsalicylate (PEPTO BISMOL) 262 MG/15ML suspension Take 30 mLs by mouth every 6 (six) hours as needed for indigestion (indigestion).    Historical Provider, MD  blood glucose meter kit and supplies Dispense based on patient and insurance preference. Use up to four times daily as directed. (FOR ICD-9 250.00, 250.01). 11/15/14   Venetia Maxon Rama, MD  dexamethasone (DECADRON) 4 MG tablet TAKE 2 TABLETS DAILY ON THE DAY AFTER CHEMO THEN 2 TABLETS 2 TIMES DAILY FOR 2 DAYS WITH FOOD 09/30/14   Chauncey Cruel, MD  fluconazole (DIFLUCAN) 100 MG tablet Take 1 tablet (100 mg total) by mouth daily. Patient not taking: Reported on 11/12/2014 09/02/14   Chauncey Cruel, MD  glipiZIDE (GLUCOTROL) 5 MG tablet Take 1 tablet (5 mg total) by mouth 2 (two) times daily before a meal. 11/15/14   Venetia Maxon Rama, MD  ibuprofen (ADVIL,MOTRIN) 200 MG tablet Take 1 tablet (200 mg total) by mouth every 6 (six) hours as needed for moderate pain. Patient not taking: Reported on 12/22/2014 09/09/14   Chauncey Cruel, MD  Insulin Glargine (LANTUS) 100 UNIT/ML Solostar Pen Inject 15 Units into the skin daily at 10 pm. 11/15/14   Venetia Maxon Rama, MD  lidocaine (LIDODERM) 5 % Place 1 patch onto the skin daily. Remove & Discard patch within 12 hours or as directed by MD 01/01/15   Margarita Mail, PA-C  lidocaine-prilocaine (EMLA) cream Apply 1 application topically as needed. 07/15/14   Marcelino Duster, NP  LORazepam (ATIVAN) 0.5 MG tablet Take 1 tablet (0.5 mg total) by mouth every 6 (six) hours as needed (Nausea or vomiting). Patient not taking: Reported on 12/29/2014 07/15/14   Chauncey Cruel, MD  metFORMIN (GLUCOPHAGE-XR) 500 MG 24 hr tablet  Take 1 tablet (500 mg total) by mouth 2 (two) times daily with a meal. 11/15/14   Venetia Maxon Rama, MD  naproxen (EC NAPROSYN) 500 MG EC tablet Take 1 tablet (500 mg total) by mouth 2 (two) times daily with a meal. 01/01/15   Margarita Mail, PA-C  nicotine (NICODERM CQ) 21 mg/24hr patch Place 1 patch (21 mg total) onto the skin daily. Patient not taking: Reported on 11/12/2014 09/02/14   Chauncey Cruel, MD  omeprazole (PRILOSEC) 40 MG capsule Take 1 capsule (40 mg total)  by mouth at bedtime. 12/29/14   Marcelino Duster, NP  ondansetron (ZOFRAN ODT) 8 MG disintegrating tablet Take 1 tablet (8 mg total) by mouth every 8 (eight) hours as needed for nausea or vomiting. 12/22/14   Marcelino Duster, NP  oxyCODONE-acetaminophen (PERCOCET) 7.5-325 MG per tablet Take 1 tablet by mouth every 4 (four) hours as needed for pain. 12/22/14   Marcelino Duster, NP  prochlorperazine (COMPAZINE) 10 MG tablet Take 1 tablet (10 mg total) by mouth every 6 (six) hours as needed for nausea or vomiting. 12/29/14   Marcelino Duster, NP  ranitidine (ZANTAC) 150 MG tablet Take 1 tablet (150 mg total) by mouth 2 (two) times daily. Patient not taking: Reported on 12/29/2014 06/03/14   Ashley Murrain, NP   Triage Vitals: BP 136/81 mmHg  Pulse 101  Temp(Src) 98.2 F (36.8 C)  Resp 18  SpO2 98%   Physical Exam  Constitutional: She is oriented to person, place, and time. She appears well-developed and well-nourished. No distress.  HENT:  Head: Normocephalic and atraumatic.  Eyes: Conjunctivae and EOM are normal.  Neck: Neck supple. No tracheal deviation present.  Cardiovascular: Normal rate.   Pulmonary/Chest: Effort normal. No respiratory distress.  Musculoskeletal: Normal range of motion.  Neurological: She is alert and oriented to person, place, and time.  Skin: Skin is warm and dry.  Well healed surgical scar at 2 o'clock of left breast. Skin changes to breast tissue peau d'orange. No signs of infection  Psychiatric: She  has a normal mood and affect. Her behavior is normal.  Nursing note and vitals reviewed.   ED Course  Procedures (including critical care time) DIAGNOSTIC STUDIES: Oxygen Saturation is 98% on RA, normal by my interpretation.    COORDINATION OF CARE: 5:00 PM-Discussed treatment plan which includes follow up with primary care physician, Dr. Alyson Ingles, and pain medication in ED with pt at bedside and pt agreed to plan. Prescribed Lidoderm patches and Naproxen.     Labs Review Labs Reviewed - No data to display  Imaging Review No results found.   EKG Interpretation None      MDM   Final diagnoses:  Breast pain     Patient with chronic left breast pain and lymphedema. I discussed the fact that she should be using 1 doctor for her pain management and definitely was not getting her medications early. I discussed that this flags here in our system for misuse. I also discussed the fact that overuse of narcotics can lead to overdose and complications including death. The patient will not receive narcotic pain medications today. For that reason, as she should have enough medications to last for the next 5 days and then some. The patient expresses her understanding of the situation and he thinks before explaining it to her. She will follow-up with Dr. Alyson Ingles on Monday. Discharged with naproxen and Lidoderm. She will receive a shot of Dilaudid for acute pain here.   Patient with significant reduction of her pain. She appears safe for discharge at this time. No signs of infection of the breast tissue. Skin changes suggest chronic lymphedema I personally performed the services described in this documentation, which was scribed in my presence. The recorded information has been reviewed and is accurate.        Margarita Mail, PA-C 01/01/15 1818  Blanchie Dessert, MD 01/01/15 [phone]

## 2015-01-05 ENCOUNTER — Encounter: Payer: Self-pay | Admitting: Oncology

## 2015-01-05 ENCOUNTER — Other Ambulatory Visit: Payer: Self-pay | Admitting: *Deleted

## 2015-01-05 ENCOUNTER — Ambulatory Visit (HOSPITAL_BASED_OUTPATIENT_CLINIC_OR_DEPARTMENT_OTHER): Payer: Medicaid Other | Admitting: Oncology

## 2015-01-05 ENCOUNTER — Other Ambulatory Visit (HOSPITAL_BASED_OUTPATIENT_CLINIC_OR_DEPARTMENT_OTHER): Payer: Medicaid Other

## 2015-01-05 ENCOUNTER — Encounter: Payer: Self-pay | Admitting: Emergency Medicine

## 2015-01-05 ENCOUNTER — Other Ambulatory Visit: Payer: Self-pay

## 2015-01-05 ENCOUNTER — Ambulatory Visit: Payer: Medicaid Other

## 2015-01-05 ENCOUNTER — Ambulatory Visit: Payer: Self-pay | Admitting: Oncology

## 2015-01-05 VITALS — BP 151/79 | HR 93 | Temp 98.7°F | Wt 183.8 lb

## 2015-01-05 DIAGNOSIS — C773 Secondary and unspecified malignant neoplasm of axilla and upper limb lymph nodes: Secondary | ICD-10-CM

## 2015-01-05 DIAGNOSIS — C50412 Malignant neoplasm of upper-outer quadrant of left female breast: Secondary | ICD-10-CM

## 2015-01-05 DIAGNOSIS — I890 Lymphedema, not elsewhere classified: Secondary | ICD-10-CM

## 2015-01-05 DIAGNOSIS — Z720 Tobacco use: Secondary | ICD-10-CM

## 2015-01-05 DIAGNOSIS — C50912 Malignant neoplasm of unspecified site of left female breast: Secondary | ICD-10-CM

## 2015-01-05 DIAGNOSIS — Z170 Estrogen receptor positive status [ER+]: Secondary | ICD-10-CM

## 2015-01-05 DIAGNOSIS — G893 Neoplasm related pain (acute) (chronic): Secondary | ICD-10-CM

## 2015-01-05 LAB — CBC WITH DIFFERENTIAL/PLATELET
BASO%: 0.3 % (ref 0.0–2.0)
Basophils Absolute: 0 10*3/uL (ref 0.0–0.1)
EOS ABS: 0.1 10*3/uL (ref 0.0–0.5)
EOS%: 1 % (ref 0.0–7.0)
HCT: 36.1 % (ref 34.8–46.6)
HGB: 11.9 g/dL (ref 11.6–15.9)
LYMPH%: 23.2 % (ref 14.0–49.7)
MCH: 29.5 pg (ref 25.1–34.0)
MCHC: 33 g/dL (ref 31.5–36.0)
MCV: 89.6 fL (ref 79.5–101.0)
MONO#: 0.3 10*3/uL (ref 0.1–0.9)
MONO%: 4.7 % (ref 0.0–14.0)
NEUT#: 4.8 10*3/uL (ref 1.5–6.5)
NEUT%: 70.8 % (ref 38.4–76.8)
PLATELETS: 277 10*3/uL (ref 145–400)
RBC: 4.03 10*6/uL (ref 3.70–5.45)
RDW: 14.8 % — ABNORMAL HIGH (ref 11.2–14.5)
WBC: 6.8 10*3/uL (ref 3.9–10.3)
lymph#: 1.6 10*3/uL (ref 0.9–3.3)

## 2015-01-05 MED ORDER — OXYCODONE-ACETAMINOPHEN 7.5-325 MG PO TABS: 1.0000 | ORAL_TABLET | ORAL | Status: DC | PRN

## 2015-01-05 NOTE — Progress Notes (Signed)
Patient scheduled for chemo at 1400. Due to unavailable chair at that time there was a delay in getting the patient back to the treatment room. Patient came into the treatment room at approximately 1430 voicing her concerns about why she hasn't been called for her treatment yet. At this same time a patient was being discharged and the chair was in the process of being cleaned. The patient was then noted to leave the cancer center on her own will.

## 2015-01-05 NOTE — Progress Notes (Signed)
[place]  Telephone:([phone]) [phone] Fax:([phone]) [phone]     ID: Laura Matthews DOB: Mar 28, 1966  MR#: [id number]  UXL#:[id number]  Patient Care Team: [name], MD as PCP - General (Family Medicine) Stark Klein, MD as Consulting Physician (General Surgery) Chauncey Cruel, MD as Consulting Physician (Oncology) Thea Silversmith, MD as Consulting Physician (Radiation Oncology) [facility] Bunnie Pion, NP as Nurse Practitioner (Nurse Practitioner)  CHIEF COMPLAINT: Stage III left breast cancer  CURRENT TREATMENT:adjuvant chemotherapy  BREAST CANCER HISTORY: From the original intake note:  The patient herself palpated a mass in her left breast and brought it to the attention of [facility] NP 04/22/2014. She palpated a large nontender mass at the 4:00 position as well as a small tender mass in the 6:00 position of the left breast. The patient was set up for imaging at the breast Center where on 05/05/2014 she underwent bilateral diagnostic mammography and left breast ultrasonography. The mammogram showed an irregular mass in the outer left breast with no other suspicious findings. This was firm and fixed at the 3:00 position approximately 10 cm from the nipple. Ultrasound showed a hypoechoic irregular solid mass in this area, measuring 1.8 cm. Ultrasound of the left axilla showed 2 suspicious level I lymph nodes, the largest measuring 1 cm, with a thickened pole. A smaller lymph node measured 0.6 cm but had no visible fatty hilum.  On 05/09/2014 the patient underwent biopsy of the left breast mass (she refused biopsy of the left axilla) which showed ([id number]) and invasive ductal carcinoma, grade 2, estrogen receptor positive at 100%, with strong staining intensity, progesterone receptor 80% positive, with weak staining intensity, with an MIB-1 of 80%, and no HER-2 amplification.  On 05/16/2014 the patient underwent bilateral breast MRI. This showed a 1.7 cm irregular enhancing  mass at the 3:00 position of the left breast associated with the biopsy cleft. There were 3 level I left axillary lymph nodes with thickened cortices. The largest measured 1.3 cm. There were no other findings of concern.  Her subsequent history is as detailed below.  INTERVAL HISTORY: Laura Matthews returns for follow up of her breast cancer. Today she is due for cycle 7 of paclitaxel. She has been wearing her compression bra from surgery, but the ymphedema issues to her left breast continue. She is out of Percocet. She was seen in the ER several days ago for pain, but was told that time that she could not get a refill of her Percocet. She was instead given a shot of Dilaudid which helped her pain for about 2 days. She is due for her refill today. Indicates that she takes her Percocet about 1 tablet every 4 hours as needed for pain.   REVIEW OF SYSTEMS:  Laura Matthews denies fevers or chills. She described urinary frequency, and knows this is a sign of uncontrolled blood sugars, but she insists they are fine when the home health aide comes to check it. This week she has experienced blurred vision as well.  She has no shortness of breath, chest pain, palpitations, or fatigue. Denies nausea and vomiting. She denies mouth sores, rashes, or peripheral neuropathy symptoms. A detailed review of systems was otherwise negative.   PAST MEDICAL HISTORY: Past Medical History  Diagnosis Date  . Mental disorder     depression-  . Arthritis   . Hot flashes   . Head ache   . Malignant neoplasm of breast (female), unspecified site   . Family history of malignant neoplasm of  breast   . Depression     pt denies   . Anemia     low iron  . Newly diagnosed diabetes 11/12/2014    PAST SURGICAL HISTORY: Past Surgical History  Procedure Laterality Date  . Knee surgery  1995    right  . Foot surgery  2011    rt  . Tubal ligation    . Uterine fibroid surgery  2005    ablation  . Breast lumpectomy with sentinel lymph node  biopsy Left 06/11/2014    Procedure: BREAST LUMPECTOMY WITH SENTINEL LYMPH NODE BX, POSSIBLE AXILLARY LYMPH NODE DISSECTION;  Surgeon: Stark Klein, MD;  Location: [place];  Service: General;  Laterality: Left;  . Portacath placement N/A 07/02/2014    Procedure: INSERTION PORT-A-CATH;  Surgeon: Stark Klein, MD;  Location: [place];  Service: General;  Laterality: N/A;  . Incision and drainage Left 07/02/2014    Procedure: Drainage left breast seroma;  Surgeon: Stark Klein, MD;  Location: [place];  Service: General;  Laterality: Left;  . Evacuation breast hematoma Left 07/10/2014    Procedure: EVACUATION OF LEFT BREAST HEMATOMA ;  Surgeon: Stark Klein, MD;  Location: [place];  Service: General;  Laterality: Left;  . Incision and drainage abscess N/A 11/12/2014    Procedure: ASPIRATION AND INCISION AND DRAINAGE LEFT BREAST ABSCESS;  Surgeon: Michael Boston, MD;  Location: [facility];  Service: General;  Laterality: N/A;    FAMILY HISTORY Family History  Problem Relation Age of Onset  . Breast cancer Mother 30    currently 53; TAH/BSO d/t ?cervical ca at 14  . Breast cancer Maternal Aunt     dx 83s; currently in her late 67s  . Lung cancer Father     deceased 30  . Diabetes Neg Hx    the patient's father died at the age of 29 from lung cancer. The patient's mother was diagnosed with breast cancer at the age of 28. She survives. The patient had 2 brothers, and 2 sisters. One brother may have a diagnosis of "stomach cancer"  GYNECOLOGIC HISTORY:  No LMP recorded. Patient has had an ablation. Menarche age 52 which is also when the patient first had a child. She is GX P3. She stopped having periods after laser ablation in 2005.  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 She has lived with her daughter Laura Matthews and her 4 children who are 3, 8, 5, and 4. 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 More  recently the patient is living in a shelter. The patient's son Laura Matthews is a fast food cook in [place] and the patient's daughter Laura Matthews works as a Scientist, water quality, also in [place]. The patient has 7 grandchildren. She is not a church attender    ADVANCED DIRECTIVES: Not in place; this was discussed with the patient on her first visit, 05/21/2014, and she indicated she plans to name her daughter Laura Matthews as her healthcare power of attorney   HEALTH MAINTENANCE: History  Substance Use Topics  . Smoking status: Current Every Day Smoker -- 0.25 packs/day    Types: Cigarettes  . Smokeless tobacco: Never Used  . Alcohol Use: No     Comment: Percoset,Vicodin, Oxycotin     Colonoscopy:  PAP:  Bone density:  Lipid panel:  Allergies  Allergen Reactions  . Hydrocodone Itching and Nausea Only    Current Outpatient Prescriptions  Medication Sig Dispense Refill  .  ALPRAZolam (XANAX) 1 MG tablet   3  . Alum & Mag Hydroxide-Simeth (MAGIC MOUTHWASH) SOLN Take 5 mLs by mouth 4 (four) times daily as needed for mouth pain.    Marland Kitchen bismuth subsalicylate (PEPTO BISMOL) 262 MG/15ML suspension Take 30 mLs by mouth every 6 (six) hours as needed for indigestion (indigestion).    . blood glucose meter kit and supplies Dispense based on patient and insurance preference. Use up to four times daily as directed. (FOR ICD-9 250.00, 250.01). 1 each 0  . dexamethasone (DECADRON) 4 MG tablet TAKE 2 TABLETS DAILY ON THE DAY AFTER CHEMO THEN 2 TABLETS 2 TIMES DAILY FOR 2 DAYS WITH FOOD 30 tablet 0  . glipiZIDE (GLUCOTROL) 5 MG tablet Take 1 tablet (5 mg total) by mouth 2 (two) times daily before a meal. 60 tablet 3  . ibuprofen (ADVIL,MOTRIN) 200 MG tablet Take 1 tablet (200 mg total) by mouth every 6 (six) hours as needed for moderate pain. 30 tablet 0  . Insulin Glargine (LANTUS) 100 UNIT/ML Solostar Pen Inject 15 Units into the skin daily at 10 pm. 15 mL 11  . lidocaine (LIDODERM) 5 % Place 1 patch onto the skin daily.  Remove & Discard patch within 12 hours or as directed by MD 30 patch 0  . lidocaine-prilocaine (EMLA) cream Apply 1 application topically as needed. 30 g 0  . metFORMIN (GLUCOPHAGE-XR) 500 MG 24 hr tablet Take 1 tablet (500 mg total) by mouth 2 (two) times daily with a meal. 60 tablet 3  . naproxen (EC NAPROSYN) 500 MG EC tablet Take 1 tablet (500 mg total) by mouth 2 (two) times daily with a meal. 30 tablet 0  . nicotine (NICODERM CQ) 21 mg/24hr patch Place 1 patch (21 mg total) onto the skin daily. 28 patch 1  . omeprazole (PRILOSEC) 40 MG capsule Take 1 capsule (40 mg total) by mouth at bedtime. 30 capsule 4  . ondansetron (ZOFRAN ODT) 8 MG disintegrating tablet Take 1 tablet (8 mg total) by mouth every 8 (eight) hours as needed for nausea or vomiting. 20 tablet 0  . oxyCODONE-acetaminophen (PERCOCET) 7.5-325 MG per tablet Take 1 tablet by mouth every 4 (four) hours as needed for pain. 40 tablet 0  . prochlorperazine (COMPAZINE) 10 MG tablet Take 1 tablet (10 mg total) by mouth every 6 (six) hours as needed for nausea or vomiting. 30 tablet 1  . ranitidine (ZANTAC) 150 MG tablet Take 1 tablet (150 mg total) by mouth 2 (two) times daily. 60 tablet 0   No current facility-administered medications for this visit.   Facility-Administered Medications Ordered in Other Visits  Medication Dose Route Frequency Provider Last Rate Last Dose  . sodium chloride 0.9 % injection 10 mL  10 mL Intracatheter PRN Chauncey Cruel, MD   10 mL at 07/29/14 1704    OBJECTIVE: Middle-aged [place] American woman who appears stated age 7 Vitals:   01/05/15 1320  BP: 151/79  Pulse: 93  Temp: 98.7 F (37.1 C)     Body mass index is 26.37 kg/(m^2).    ECOG FS:1 - Symptomatic but completely ambulatory  Skin: warm, dry  HEENT: sclerae anicteric, conjunctivae pink, oropharynx clear. No thrush or mucositis.  Lymph Nodes: No cervical or supraclavicular lymphadenopathy  Lungs: clear to auscultation bilaterally,  no rales, wheezes, or rhonci  Heart: regular rate and rhythm  Abdomen: round, soft, non tender, positive bowel sounds  Musculoskeletal: No focal spinal tenderness, trace lymphedema to left upper extremity Neuro:  non focal, well oriented, positive affect  Breast: deferred  LAB RESULTS:  CMP     Component Value Date/Time   NA 138 12/29/2014 1158   NA 136 11/13/2014 1010   K 4.4 12/29/2014 1158   K 4.2 11/13/2014 1010   CL 101 11/13/2014 1010   CO2 27 12/29/2014 1158   CO2 29 11/13/2014 1010   GLUCOSE 169* 12/29/2014 1158   GLUCOSE 191* 11/13/2014 1010   BUN 10.2 12/29/2014 1158   BUN 12 11/13/2014 1010   CREATININE 0.7 12/29/2014 1158   CREATININE 0.46* 11/13/2014 1010   CALCIUM 9.5 12/29/2014 1158   CALCIUM 8.5 11/13/2014 1010   PROT 7.1 12/29/2014 1158   PROT 6.5 09/17/2014 0700   ALBUMIN 4.1 12/29/2014 1158   ALBUMIN 3.2* 09/17/2014 0700   AST 24 12/29/2014 1158   AST 11 09/17/2014 0700   ALT 28 12/29/2014 1158   ALT 20 09/17/2014 0700   ALKPHOS 61 12/29/2014 1158   ALKPHOS 131* 09/17/2014 0700   BILITOT 0.23 12/29/2014 1158   BILITOT <0.2* 09/17/2014 0700   GFRNONAA >90 11/13/2014 1010   GFRAA >90 11/13/2014 1010    I No results found for: SPEP  Lab Results  Component Value Date   WBC 6.8 01/05/2015   NEUTROABS 4.8 01/05/2015   HGB 11.9 01/05/2015   HCT 36.1 01/05/2015   MCV 89.6 01/05/2015   PLT 277 01/05/2015      Chemistry      Component Value Date/Time   NA 138 12/29/2014 1158   NA 136 11/13/2014 1010   K 4.4 12/29/2014 1158   K 4.2 11/13/2014 1010   CL 101 11/13/2014 1010   CO2 27 12/29/2014 1158   CO2 29 11/13/2014 1010   BUN 10.2 12/29/2014 1158   BUN 12 11/13/2014 1010   CREATININE 0.7 12/29/2014 1158   CREATININE 0.46* 11/13/2014 1010      Component Value Date/Time   CALCIUM 9.5 12/29/2014 1158   CALCIUM 8.5 11/13/2014 1010   ALKPHOS 61 12/29/2014 1158   ALKPHOS 131* 09/17/2014 0700   AST 24 12/29/2014 1158   AST 11 09/17/2014  0700   ALT 28 12/29/2014 1158   ALT 20 09/17/2014 0700   BILITOT 0.23 12/29/2014 1158   BILITOT <0.2* 09/17/2014 0700       No results found for: LABCA2  No components found for: LABCA125  No results for input(s): INR in the last 168 hours.  Urinalysis    Component Value Date/Time   COLORURINE YELLOW 06/03/2014 1050   APPEARANCEUR CLEAR 06/03/2014 1050   LABSPEC >1.030* 06/03/2014 1050   PHURINE 6.0 06/03/2014 1050   GLUCOSEU NEGATIVE 06/03/2014 1050   HGBUR SMALL* 06/03/2014 1050   BILIRUBINUR SMALL* 06/03/2014 1050   KETONESUR NEGATIVE 06/03/2014 1050   PROTEINUR NEGATIVE 06/03/2014 1050   UROBILINOGEN 0.2 06/03/2014 1050   NITRITE NEGATIVE 06/03/2014 1050   LEUKOCYTESUR TRACE* 06/03/2014 1050    STUDIES: No results found.   ASSESSMENT: 49 y.o. [place] woman status post left breast upper outer quadrant biopsy 05/09/2014 for a clinicalT1c N1, stage IIA invasive ductal carcinoma, grade 2, estrogen and progesterone receptor positive, HER-2 nonamplified, with an MIB-1 of 80%  (1) patient met with genetics counselor 05/29/2014 but decided against genetics testing   (2) tobacco abuse. The patient has been strongly urged to discontinue smoking. She was prescribed a nicotine patch 09/02/2014  (3) status post left lumpectomy and axillary lymph node dissection 06/11/2014 for a pT1c pN3, stage IIIC invasive ductal carcinoma, grade  3, with repeat HER-2 negative  (4) adjuvant chemotherapy to consist of doxorubicin and cyclophosphamide in dose dense fashion x4, with Neulasta support, started 07/29/2014, to be followed by paclitaxel weekly x12  (5) adjuvant radiation to follow chemotherapy  (6) anti-estrogens for 5-10 years to follow radiation  PLAN: Kamron is doing well today. The labs were reviewed in detail and were stable. She will proceed with cycle 7 of paclitaxel as planned today.   For pain, we reviewed the use of her compression machine at home for her left  lymphedema issues. This may help more than anything for her pain. We have also discussed resuming the exercise that she was taught for her lymphedema by the physical therapist. I have refilled her Percocet today. She does not gets any more frequently than once every 2 weeks.  Shanayah will return next week for labs and cycle 8 of treatment. She understands and agrees with this plan. She knows the goal of treatment in her case is cure. She has been encouraged to call with any issues that might arise before her next visit here.    Mikey Bussing, NP 01/05/2015

## 2015-01-12 ENCOUNTER — Ambulatory Visit: Payer: Self-pay

## 2015-01-12 ENCOUNTER — Other Ambulatory Visit: Payer: Self-pay

## 2015-01-12 ENCOUNTER — Ambulatory Visit: Payer: Self-pay | Admitting: Nurse Practitioner

## 2015-01-12 ENCOUNTER — Telehealth: Payer: Self-pay | Admitting: *Deleted

## 2015-01-12 NOTE — Telephone Encounter (Signed)
Called pt to inquire about appt for today. Pt did not show for appt today. No answer but left a VM and told pt to call this nurse back so I can r/s her appt later this week. Message to be forwarded to Gentry Fitz, NP.

## 2015-01-19 ENCOUNTER — Ambulatory Visit: Payer: Self-pay

## 2015-01-19 ENCOUNTER — Other Ambulatory Visit: Payer: Self-pay

## 2015-01-19 ENCOUNTER — Ambulatory Visit: Payer: Self-pay | Admitting: Oncology

## 2015-01-26 ENCOUNTER — Ambulatory Visit (HOSPITAL_BASED_OUTPATIENT_CLINIC_OR_DEPARTMENT_OTHER): Payer: Medicaid Other | Admitting: Nurse Practitioner

## 2015-01-26 ENCOUNTER — Other Ambulatory Visit: Payer: Self-pay | Admitting: *Deleted

## 2015-01-26 ENCOUNTER — Other Ambulatory Visit (HOSPITAL_BASED_OUTPATIENT_CLINIC_OR_DEPARTMENT_OTHER): Payer: Medicaid Other

## 2015-01-26 ENCOUNTER — Encounter: Payer: Self-pay | Admitting: Nurse Practitioner

## 2015-01-26 ENCOUNTER — Ambulatory Visit (HOSPITAL_BASED_OUTPATIENT_CLINIC_OR_DEPARTMENT_OTHER): Payer: Medicaid Other

## 2015-01-26 VITALS — BP 144/87 | HR 114 | Temp 99.3°F | Resp 18 | Ht 70.0 in | Wt 180.2 lb

## 2015-01-26 DIAGNOSIS — C773 Secondary and unspecified malignant neoplasm of axilla and upper limb lymph nodes: Secondary | ICD-10-CM

## 2015-01-26 DIAGNOSIS — C50911 Malignant neoplasm of unspecified site of right female breast: Secondary | ICD-10-CM

## 2015-01-26 DIAGNOSIS — Z170 Estrogen receptor positive status [ER+]: Secondary | ICD-10-CM

## 2015-01-26 DIAGNOSIS — C50412 Malignant neoplasm of upper-outer quadrant of left female breast: Secondary | ICD-10-CM

## 2015-01-26 DIAGNOSIS — Z5111 Encounter for antineoplastic chemotherapy: Secondary | ICD-10-CM

## 2015-01-26 DIAGNOSIS — F32A Depression, unspecified: Secondary | ICD-10-CM

## 2015-01-26 DIAGNOSIS — F329 Major depressive disorder, single episode, unspecified: Secondary | ICD-10-CM | POA: Insufficient documentation

## 2015-01-26 DIAGNOSIS — I890 Lymphedema, not elsewhere classified: Secondary | ICD-10-CM

## 2015-01-26 DIAGNOSIS — C50812 Malignant neoplasm of overlapping sites of left female breast: Secondary | ICD-10-CM | POA: Diagnosis not present

## 2015-01-26 DIAGNOSIS — C50912 Malignant neoplasm of unspecified site of left female breast: Secondary | ICD-10-CM

## 2015-01-26 LAB — CBC WITH DIFFERENTIAL/PLATELET
BASO%: 0.3 % (ref 0.0–2.0)
Basophils Absolute: 0 10*3/uL (ref 0.0–0.1)
EOS%: 0.6 % (ref 0.0–7.0)
Eosinophils Absolute: 0.1 10*3/uL (ref 0.0–0.5)
HEMATOCRIT: 37.3 % (ref 34.8–46.6)
HGB: 12.6 g/dL (ref 11.6–15.9)
LYMPH#: 1.5 10*3/uL (ref 0.9–3.3)
LYMPH%: 13.7 % — ABNORMAL LOW (ref 14.0–49.7)
MCH: 29 pg (ref 25.1–34.0)
MCHC: 33.8 g/dL (ref 31.5–36.0)
MCV: 85.9 fL (ref 79.5–101.0)
MONO#: 0.5 10*3/uL (ref 0.1–0.9)
MONO%: 4.7 % (ref 0.0–14.0)
NEUT#: 8.9 10*3/uL — ABNORMAL HIGH (ref 1.5–6.5)
NEUT%: 80.7 % — AB (ref 38.4–76.8)
Platelets: 337 10*3/uL (ref 145–400)
RBC: 4.34 10*6/uL (ref 3.70–5.45)
RDW: 14.7 % — ABNORMAL HIGH (ref 11.2–14.5)
WBC: 11 10*3/uL — ABNORMAL HIGH (ref 3.9–10.3)

## 2015-01-26 LAB — COMPREHENSIVE METABOLIC PANEL (CC13)
ALBUMIN: 4 g/dL (ref 3.5–5.0)
ALT: 29 U/L (ref 0–55)
ANION GAP: 15 meq/L — AB (ref 3–11)
AST: 18 U/L (ref 5–34)
Alkaline Phosphatase: 76 U/L (ref 40–150)
BILIRUBIN TOTAL: 0.22 mg/dL (ref 0.20–1.20)
BUN: 6.3 mg/dL — AB (ref 7.0–26.0)
CO2: 23 mEq/L (ref 22–29)
Calcium: 9.8 mg/dL (ref 8.4–10.4)
Chloride: 103 mEq/L (ref 98–109)
Creatinine: 0.7 mg/dL (ref 0.6–1.1)
EGFR: >90 mL/min/{1.73_m2} (ref >90)
Glucose: 201 mg/dl — ABNORMAL HIGH (ref 70–140)
Potassium: 3.7 mEq/L (ref 3.5–5.1)
Sodium: 141 mEq/L (ref 136–145)
TOTAL PROTEIN: 7.5 g/dL (ref 6.4–8.3)

## 2015-01-26 MED ORDER — HEPARIN SOD (PORK) LOCK FLUSH 100 UNIT/ML IV SOLN
500.0000 [IU] | Freq: Once | INTRAVENOUS | Status: AC | PRN
  Administered 2015-01-26: 500 [IU]
  Filled 2015-01-26: qty 5

## 2015-01-26 MED ORDER — OXYCODONE-ACETAMINOPHEN 7.5-325 MG PO TABS: 1.0000 | ORAL_TABLET | ORAL | Status: DC | PRN

## 2015-01-26 MED ORDER — DIPHENHYDRAMINE HCL 50 MG/ML IJ SOLN
INTRAMUSCULAR | Status: AC
  Filled 2015-01-26: qty 1

## 2015-01-26 MED ORDER — SODIUM CHLORIDE 0.9 % IV SOLN
Freq: Once | INTRAVENOUS | Status: AC
  Administered 2015-01-26: 15:00:00 via INTRAVENOUS

## 2015-01-26 MED ORDER — SODIUM CHLORIDE 0.9 % IJ SOLN
10.0000 mL | INTRAMUSCULAR | Status: DC | PRN
  Administered 2015-01-26: 10 mL
  Filled 2015-01-26: qty 10

## 2015-01-26 MED ORDER — DIPHENHYDRAMINE HCL 50 MG/ML IJ SOLN
25.0000 mg | Freq: Once | INTRAMUSCULAR | Status: AC
  Administered 2015-01-26: 25 mg via INTRAVENOUS

## 2015-01-26 MED ORDER — PACLITAXEL CHEMO INJECTION 300 MG/50ML
80.0000 mg/m2 | Freq: Once | INTRAVENOUS | Status: AC
  Administered 2015-01-26: 162 mg via INTRAVENOUS
  Filled 2015-01-26: qty 27

## 2015-01-26 MED ORDER — FAMOTIDINE IN NACL 20-0.9 MG/50ML-% IV SOLN
INTRAVENOUS | Status: AC
  Filled 2015-01-26: qty 50

## 2015-01-26 MED ORDER — FAMOTIDINE IN NACL 20-0.9 MG/50ML-% IV SOLN
20.0000 mg | Freq: Once | INTRAVENOUS | Status: AC
  Administered 2015-01-26: 20 mg via INTRAVENOUS

## 2015-01-26 MED ORDER — SODIUM CHLORIDE 0.9 % IV SOLN
Freq: Once | INTRAVENOUS | Status: AC
  Administered 2015-01-26: 15:00:00 via INTRAVENOUS
  Filled 2015-01-26: qty 4

## 2015-01-26 NOTE — Progress Notes (Signed)
[place]  [phone]) [phone] Fax:(336) [phone]     ID: [name] DOB: 1966/10/31  MR#: [id number]  FGB#:[id number]  Patient Care Team: [name], MD as PCP - General (Family Medicine) Stark Klein, MD as Consulting Physician (General Surgery) Chauncey Cruel, MD as Consulting Physician (Oncology) Thea Silversmith, MD as Consulting Physician (Radiation Oncology) [facility] Bunnie Pion, NP as Nurse Practitioner (Nurse Practitioner)  CHIEF COMPLAINT: Stage III left breast cancer  CURRENT TREATMENT:adjuvant chemotherapy  BREAST CANCER HISTORY: From the original intake note:  The patient herself palpated a mass in her left breast and brought it to the attention of [facility] NP 04/22/2014. She palpated a large nontender mass at the 4:00 position as well as a small tender mass in the 6:00 position of the left breast. The patient was set up for imaging at the breast Center where on 05/05/2014 she underwent bilateral diagnostic mammography and left breast ultrasonography. The mammogram showed an irregular mass in the outer left breast with no other suspicious findings. This was firm and fixed at the 3:00 position approximately 10 cm from the nipple. Ultrasound showed a hypoechoic irregular solid mass in this area, measuring 1.8 cm. Ultrasound of the left axilla showed 2 suspicious level I lymph nodes, the largest measuring 1 cm, with a thickened pole. A smaller lymph node measured 0.6 cm but had no visible fatty hilum.  On 05/09/2014 the patient underwent biopsy of the left breast mass (she refused biopsy of the left axilla) which showed ([id number]) and invasive ductal carcinoma, grade 2, estrogen receptor positive at 100%, with strong staining intensity, progesterone receptor 80% positive, with weak staining intensity, with an MIB-1 of 80%, and no HER-2 amplification.  On 05/16/2014 the patient underwent bilateral breast MRI. This showed a 1.7 cm irregular enhancing  mass at the 3:00 position of the left breast associated with the biopsy cleft. There were 3 level I left axillary lymph nodes with thickened cortices. The largest measured 1.3 cm. There were no other findings of concern.  Her subsequent history is as detailed below.  INTERVAL HISTORY: Jalia returns for follow up of her breast cancer. Today she is due for cycle 7 of paclitaxel. This dose was originally planned on 3/7, but the patient left the [place] before being called back for treatment because she was upset with the delay. She skipped her appointments on 3/14 and 3/21 because she was so overcome with emotions. She has cried every day this month, and feels like when she is not at the [facility] momentarily, her diagnosis does not exist.   REVIEW OF SYSTEMS:  Lexxie denies fevers, chills ,nausea, vomiting or changes in bowel or bladder habits. She has no shortness of breath, chest pain, palpitations, or fatigue. She denies mouth sores, rashes, or peripheral neuropathy symptoms. She endorses anxiety and depression since her cancer diagnosis, and has been "on edge" ever since. Her children are supportive, but obviously can't understand everything she is going through. A detailed review of systems was otherwise negative.   PAST MEDICAL HISTORY: Past Medical History  Diagnosis Date  . Mental disorder     depression-  . Arthritis   . Hot flashes   . Head ache   . Malignant neoplasm of breast (female), unspecified site   . Family history of malignant neoplasm of breast   . Depression     pt denies   . Anemia     low iron  . Newly diagnosed diabetes 11/12/2014  PAST SURGICAL HISTORY: Past Surgical History  Procedure Laterality Date  . Knee surgery  1995    right  . Foot surgery  2011    rt  . Tubal ligation    . Uterine fibroid surgery  2005    ablation  . Breast lumpectomy with sentinel lymph node biopsy Left 06/11/2014    Procedure: BREAST LUMPECTOMY WITH SENTINEL LYMPH NODE  BX, POSSIBLE AXILLARY LYMPH NODE DISSECTION;  Surgeon: Stark Klein, MD;  Location: [place] [facility];  Service: General;  Laterality: Left;  . Portacath placement N/A 07/02/2014    Procedure: INSERTION PORT-A-CATH;  Surgeon: Stark Klein, MD;  Location: [place];  Service: General;  Laterality: N/A;  . Incision and drainage Left 07/02/2014    Procedure: Drainage left breast seroma;  Surgeon: Stark Klein, MD;  Location: [place];  Service: General;  Laterality: Left;  . Evacuation breast hematoma Left 07/10/2014    Procedure: EVACUATION OF LEFT BREAST HEMATOMA ;  Surgeon: Stark Klein, MD;  Location: [place];  Service: General;  Laterality: Left;  . Incision and drainage abscess N/A 11/12/2014    Procedure: ASPIRATION AND INCISION AND DRAINAGE LEFT BREAST ABSCESS;  Surgeon: Michael Boston, MD;  Location: [facility];  Service: General;  Laterality: N/A;    FAMILY HISTORY Family History  Problem Relation Age of Onset  . Breast cancer Mother 10    currently 39; TAH/BSO d/t ?cervical ca at 13  . Breast cancer Maternal Aunt     dx 19s; currently in her late 29s  . Lung cancer Father     deceased 45  . Diabetes Neg Hx    the patient's father died at the age of 51 from lung cancer. The patient's mother was diagnosed with breast cancer at the age of 108. She survives. The patient had 2 brothers, and 2 sisters. One brother may have a diagnosis of "stomach cancer"  GYNECOLOGIC HISTORY:  No LMP recorded. Patient has had an ablation. Menarche age 36 which is also when the patient first had a child. She is GX P3. She stopped having periods after laser ablation in 2005.  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 She has lived with her daughter [name] and her 4 children who are 75, 8, 5, and 4. 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 More recently the patient is living in a shelter. The patient's son [name] is a fast food  cook in [place] and the patient's daughter [name] works as a Scientist, water quality, also in [place]. The patient has 7 grandchildren. She is not a church attender    ADVANCED DIRECTIVES: Not in place; this was discussed with the patient on her first visit, 05/21/2014, and she indicated she plans to name her daughter [name] as her healthcare power of attorney   HEALTH MAINTENANCE: History  Substance Use Topics  . Smoking status: Current Every Day Smoker -- 0.25 packs/day    Types: Cigarettes  . Smokeless tobacco: Never Used  . Alcohol Use: No     Comment: Percoset,Vicodin, Oxycotin     Colonoscopy:  PAP:  Bone density:  Lipid panel:  Allergies  Allergen Reactions  . Hydrocodone Itching and Nausea Only    Current Outpatient Prescriptions  Medication Sig Dispense Refill  . blood glucose meter kit and supplies Dispense based on patient and insurance preference. Use up to four times daily as directed. (FOR ICD-9 250.00, 250.01). 1 each 0  .  dexamethasone (DECADRON) 4 MG tablet TAKE 2 TABLETS DAILY ON THE DAY AFTER CHEMO THEN 2 TABLETS 2 TIMES DAILY FOR 2 DAYS WITH FOOD 30 tablet 0  . glipiZIDE (GLUCOTROL) 5 MG tablet Take 1 tablet (5 mg total) by mouth 2 (two) times daily before a meal. 60 tablet 3  . ibuprofen (ADVIL,MOTRIN) 200 MG tablet Take 1 tablet (200 mg total) by mouth every 6 (six) hours as needed for moderate pain. 30 tablet 0  . Insulin Glargine (LANTUS) 100 UNIT/ML Solostar Pen Inject 15 Units into the skin daily at 10 pm. 15 mL 11  . lidocaine (LIDODERM) 5 % Place 1 patch onto the skin daily. Remove & Discard patch within 12 hours or as directed by MD 30 patch 0  . lidocaine-prilocaine (EMLA) cream Apply 1 application topically as needed. 30 g 0  . metFORMIN (GLUCOPHAGE-XR) 500 MG 24 hr tablet Take 1 tablet (500 mg total) by mouth 2 (two) times daily with a meal. 60 tablet 3  . naproxen (EC NAPROSYN) 500 MG EC tablet Take 1 tablet (500 mg total) by mouth 2 (two) times daily with a  meal. 30 tablet 0  . omeprazole (PRILOSEC) 40 MG capsule Take 1 capsule (40 mg total) by mouth at bedtime. 30 capsule 4  . ranitidine (ZANTAC) 150 MG tablet Take 1 tablet (150 mg total) by mouth 2 (two) times daily. 60 tablet 0  . ALPRAZolam (XANAX) 1 MG tablet   3  . Alum & Mag Hydroxide-Simeth (MAGIC MOUTHWASH) SOLN Take 5 mLs by mouth 4 (four) times daily as needed for mouth pain.    Marland Kitchen bismuth subsalicylate (PEPTO BISMOL) 262 MG/15ML suspension Take 30 mLs by mouth every 6 (six) hours as needed for indigestion (indigestion).    . nicotine (NICODERM CQ) 21 mg/24hr patch Place 1 patch (21 mg total) onto the skin daily. (Patient not taking: Reported on 01/26/2015) 28 patch 1  . ondansetron (ZOFRAN ODT) 8 MG disintegrating tablet Take 1 tablet (8 mg total) by mouth every 8 (eight) hours as needed for nausea or vomiting. (Patient not taking: Reported on 01/26/2015) 20 tablet 0  . oxyCODONE-acetaminophen (PERCOCET) 7.5-325 MG per tablet Take 1 tablet by mouth every 4 (four) hours as needed for pain. 40 tablet 0  . prochlorperazine (COMPAZINE) 10 MG tablet Take 1 tablet (10 mg total) by mouth every 6 (six) hours as needed for nausea or vomiting. (Patient not taking: Reported on 01/26/2015) 30 tablet 1   No current facility-administered medications for this visit.   Facility-Administered Medications Ordered in Other Visits  Medication Dose Route Frequency Provider Last Rate Last Dose  . sodium chloride 0.9 % injection 10 mL  10 mL Intracatheter PRN Chauncey Cruel, MD   10 mL at 07/29/14 1704    OBJECTIVE: Middle-aged [place] American woman who appears stated age 36 Vitals:   01/26/15 1320  BP: 144/87  Pulse: 114  Temp: 99.3 F (37.4 C)  Resp: 18     Body mass index is 25.86 kg/(m^2).    ECOG FS:1 - Symptomatic but completely ambulatory  Sclerae unicteric, pupils equal and reactive Oropharynx clear and moist-- no thrush No cervical or supraclavicular adenopathy Lungs no rales or  rhonchi Heart regular rate and rhythm Abd soft, nontender, positive bowel sounds MSK no focal spinal tenderness, trace edema to left upper arm Neuro: nonfocal, well oriented, appropriate affect Breasts: deferred  LAB RESULTS:  CMP     Component Value Date/Time   NA 141 01/26/2015 1308  NA 136 11/13/2014 1010   K 3.7 01/26/2015 1308   K 4.2 11/13/2014 1010   CL 101 11/13/2014 1010   CO2 23 01/26/2015 1308   CO2 29 11/13/2014 1010   GLUCOSE 201* 01/26/2015 1308   GLUCOSE 191* 11/13/2014 1010   BUN 6.3* 01/26/2015 1308   BUN 12 11/13/2014 1010   CREATININE 0.7 01/26/2015 1308   CREATININE 0.46* 11/13/2014 1010   CALCIUM 9.8 01/26/2015 1308   CALCIUM 8.5 11/13/2014 1010   PROT 7.5 01/26/2015 1308   PROT 6.5 09/17/2014 0700   ALBUMIN 4.0 01/26/2015 1308   ALBUMIN 3.2* 09/17/2014 0700   AST 18 01/26/2015 1308   AST 11 09/17/2014 0700   ALT 29 01/26/2015 1308   ALT 20 09/17/2014 0700   ALKPHOS 76 01/26/2015 1308   ALKPHOS 131* 09/17/2014 0700   BILITOT 0.22 01/26/2015 1308   BILITOT <0.2* 09/17/2014 0700   GFRNONAA >90 11/13/2014 1010   GFRAA >90 11/13/2014 1010    I No results found for: SPEP  Lab Results  Component Value Date   WBC 11.0* 01/26/2015   NEUTROABS 8.9* 01/26/2015   HGB 12.6 01/26/2015   HCT 37.3 01/26/2015   MCV 85.9 01/26/2015   PLT 337 01/26/2015      Chemistry      Component Value Date/Time   NA 141 01/26/2015 1308   NA 136 11/13/2014 1010   K 3.7 01/26/2015 1308   K 4.2 11/13/2014 1010   CL 101 11/13/2014 1010   CO2 23 01/26/2015 1308   CO2 29 11/13/2014 1010   BUN 6.3* 01/26/2015 1308   BUN 12 11/13/2014 1010   CREATININE 0.7 01/26/2015 1308   CREATININE 0.46* 11/13/2014 1010      Component Value Date/Time   CALCIUM 9.8 01/26/2015 1308   CALCIUM 8.5 11/13/2014 1010   ALKPHOS 76 01/26/2015 1308   ALKPHOS 131* 09/17/2014 0700   AST 18 01/26/2015 1308   AST 11 09/17/2014 0700   ALT 29 01/26/2015 1308   ALT 20 09/17/2014 0700    BILITOT 0.22 01/26/2015 1308   BILITOT <0.2* 09/17/2014 0700       No results found for: LABCA2  No components found for: INOMV672  No results for input(s): INR in the last 168 hours.  Urinalysis    Component Value Date/Time   COLORURINE YELLOW 06/03/2014 1050   APPEARANCEUR CLEAR 06/03/2014 1050   LABSPEC >1.030* 06/03/2014 1050   PHURINE 6.0 06/03/2014 1050   GLUCOSEU NEGATIVE 06/03/2014 1050   HGBUR SMALL* 06/03/2014 1050   BILIRUBINUR SMALL* 06/03/2014 1050   KETONESUR NEGATIVE 06/03/2014 1050   PROTEINUR NEGATIVE 06/03/2014 1050   UROBILINOGEN 0.2 06/03/2014 1050   NITRITE NEGATIVE 06/03/2014 1050   LEUKOCYTESUR TRACE* 06/03/2014 1050    STUDIES: No results found.  ASSESSMENT: 49 y.o. [place] woman status post left breast upper outer quadrant biopsy 05/09/2014 for a clinicalT1c N1, stage IIA invasive ductal carcinoma, grade 2, estrogen and progesterone receptor positive, HER-2 nonamplified, with an MIB-1 of 80%  (1) patient met with genetics counselor 05/29/2014 but decided against genetics testing   (2) tobacco abuse. The patient has been strongly urged to discontinue smoking. She was prescribed a nicotine patch 09/02/2014  (3) status post left lumpectomy and axillary lymph node dissection 06/11/2014 for a pT1c pN3, stage IIIC invasive ductal carcinoma, grade 3, with repeat HER-2 negative  (4) adjuvant chemotherapy to consist of doxorubicin and cyclophosphamide in dose dense fashion x4, with Neulasta support, started 07/29/2014, to be followed by paclitaxel  weekly x12  (5) adjuvant radiation to follow chemotherapy  (6) anti-estrogens for 5-10 years to follow radiation  PLAN: Elinda and I discussed her struggles. She has been overcome with emotion these past 2 weeks, but since her pep talks with her children, she has a renewed commitment to her treatments. The labs were reviewed in detail and were stable. She will proceed with cycle 7 of paclitaxel as planned  today.   I have refilled her percocet today, and given her the contact information to our physical therapist, Inez Catalina, to get a quick review of how to use her compression machine. I am also contacting the [facility] program to link her with a breast cancer mentor.   Ayriel will return next week for labs and cycle 8 of treatment. She understands and agrees with this plan. She knows the goal of treatment in her case is cure. She has been encouraged to call with any issues that might arise before her next visit here.   Total time spent in this appointment was 25 minutes, with greater than 50% of the time spent face to face with the patient.    Laurie Panda, NP 01/26/2015

## 2015-01-26 NOTE — Patient Instructions (Signed)
[facility] Discharge Instructions for Patients Receiving Chemotherapy  Today you received the following chemotherapy agents: Taxol.  To help prevent nausea and vomiting after your treatment, we encourage you to take your nausea medication: Compazine. Take one every 6 hours as needed.   If you develop nausea and vomiting that is not controlled by your nausea medication, call the clinic.   BELOW ARE SYMPTOMS THAT SHOULD BE REPORTED IMMEDIATELY:  *FEVER GREATER THAN 100.5 F  *CHILLS WITH OR WITHOUT FEVER  NAUSEA AND VOMITING THAT IS NOT CONTROLLED WITH YOUR NAUSEA MEDICATION  *UNUSUAL SHORTNESS OF BREATH  *UNUSUAL BRUISING OR BLEEDING  TENDERNESS IN MOUTH AND THROAT WITH OR WITHOUT PRESENCE OF ULCERS  *URINARY PROBLEMS  *BOWEL PROBLEMS  UNUSUAL RASH Items with * indicate a potential emergency and should be followed up as soon as possible.  Feel free to call the clinic should you have any questions or concerns. The clinic phone number is [phone].  Please show the [facility] at check-in to the Emergency Department and triage nurse.

## 2015-01-27 ENCOUNTER — Telehealth: Payer: Self-pay | Admitting: Nurse Practitioner

## 2015-01-27 NOTE — Telephone Encounter (Signed)
Appointments made and patient will get a new avs 4/4

## 2015-01-30 ENCOUNTER — Other Ambulatory Visit: Payer: Self-pay | Admitting: *Deleted

## 2015-01-30 DIAGNOSIS — C50412 Malignant neoplasm of upper-outer quadrant of left female breast: Secondary | ICD-10-CM

## 2015-02-02 ENCOUNTER — Ambulatory Visit (HOSPITAL_BASED_OUTPATIENT_CLINIC_OR_DEPARTMENT_OTHER): Payer: Medicaid Other | Admitting: Nurse Practitioner

## 2015-02-02 ENCOUNTER — Encounter: Payer: Self-pay | Admitting: Nurse Practitioner

## 2015-02-02 ENCOUNTER — Other Ambulatory Visit (HOSPITAL_BASED_OUTPATIENT_CLINIC_OR_DEPARTMENT_OTHER): Payer: Medicaid Other

## 2015-02-02 ENCOUNTER — Ambulatory Visit (HOSPITAL_BASED_OUTPATIENT_CLINIC_OR_DEPARTMENT_OTHER): Payer: Medicaid Other

## 2015-02-02 VITALS — BP 149/87 | HR 87 | Temp 98.1°F | Resp 20 | Wt 182.8 lb

## 2015-02-02 DIAGNOSIS — C50911 Malignant neoplasm of unspecified site of right female breast: Secondary | ICD-10-CM

## 2015-02-02 DIAGNOSIS — C50412 Malignant neoplasm of upper-outer quadrant of left female breast: Secondary | ICD-10-CM

## 2015-02-02 DIAGNOSIS — C773 Secondary and unspecified malignant neoplasm of axilla and upper limb lymph nodes: Secondary | ICD-10-CM | POA: Diagnosis not present

## 2015-02-02 DIAGNOSIS — Z801 Family history of malignant neoplasm of trachea, bronchus and lung: Secondary | ICD-10-CM

## 2015-02-02 DIAGNOSIS — N644 Mastodynia: Secondary | ICD-10-CM

## 2015-02-02 DIAGNOSIS — Z170 Estrogen receptor positive status [ER+]: Secondary | ICD-10-CM | POA: Diagnosis not present

## 2015-02-02 DIAGNOSIS — C50812 Malignant neoplasm of overlapping sites of left female breast: Secondary | ICD-10-CM

## 2015-02-02 DIAGNOSIS — R079 Chest pain, unspecified: Secondary | ICD-10-CM | POA: Insufficient documentation

## 2015-02-02 DIAGNOSIS — Z803 Family history of malignant neoplasm of breast: Secondary | ICD-10-CM

## 2015-02-02 DIAGNOSIS — Z5111 Encounter for antineoplastic chemotherapy: Secondary | ICD-10-CM

## 2015-02-02 DIAGNOSIS — F418 Other specified anxiety disorders: Secondary | ICD-10-CM | POA: Diagnosis not present

## 2015-02-02 DIAGNOSIS — Z720 Tobacco use: Secondary | ICD-10-CM

## 2015-02-02 LAB — CBC WITH DIFFERENTIAL/PLATELET
BASO%: 0.9 % (ref 0.0–2.0)
Basophils Absolute: 0.1 10*3/uL (ref 0.0–0.1)
EOS%: 2.5 % (ref 0.0–7.0)
Eosinophils Absolute: 0.2 10*3/uL (ref 0.0–0.5)
HEMATOCRIT: 36.7 % (ref 34.8–46.6)
HGB: 11.7 g/dL (ref 11.6–15.9)
LYMPH%: 24 % (ref 14.0–49.7)
MCH: 27.8 pg (ref 25.1–34.0)
MCHC: 31.9 g/dL (ref 31.5–36.0)
MCV: 87.2 fL (ref 79.5–101.0)
MONO#: 0.4 10*3/uL (ref 0.1–0.9)
MONO%: 6 % (ref 0.0–14.0)
NEUT#: 4.9 10*3/uL (ref 1.5–6.5)
NEUT%: 66.6 % (ref 38.4–76.8)
Platelets: 371 10*3/uL (ref 145–400)
RBC: 4.21 10*6/uL (ref 3.70–5.45)
RDW: 15.5 % — ABNORMAL HIGH (ref 11.2–14.5)
WBC: 7.3 10*3/uL (ref 3.9–10.3)
lymph#: 1.8 10*3/uL (ref 0.9–3.3)

## 2015-02-02 LAB — COMPREHENSIVE METABOLIC PANEL (CC13)
ALT: 19 U/L (ref 0–55)
AST: 16 U/L (ref 5–34)
Albumin: 4.1 g/dL (ref 3.5–5.0)
Alkaline Phosphatase: 70 U/L (ref 40–150)
Anion Gap: 10 mEq/L (ref 3–11)
BILIRUBIN TOTAL: 0.27 mg/dL (ref 0.20–1.20)
BUN: 8.8 mg/dL (ref 7.0–26.0)
CO2: 28 mEq/L (ref 22–29)
Calcium: 9.3 mg/dL (ref 8.4–10.4)
Chloride: 101 mEq/L (ref 98–109)
Creatinine: 0.7 mg/dL (ref 0.6–1.1)
Glucose: 138 mg/dl (ref 70–140)
Potassium: 4.3 mEq/L (ref 3.5–5.1)
Sodium: 140 mEq/L (ref 136–145)
Total Protein: 7.3 g/dL (ref 6.4–8.3)

## 2015-02-02 MED ORDER — SODIUM CHLORIDE 0.9 % IJ SOLN
10.0000 mL | INTRAMUSCULAR | Status: DC | PRN
  Administered 2015-02-02: 10 mL
  Filled 2015-02-02: qty 10

## 2015-02-02 MED ORDER — HEPARIN SOD (PORK) LOCK FLUSH 100 UNIT/ML IV SOLN
500.0000 [IU] | Freq: Once | INTRAVENOUS | Status: AC | PRN
  Administered 2015-02-02: 500 [IU]
  Filled 2015-02-02: qty 5

## 2015-02-02 MED ORDER — FAMOTIDINE IN NACL 20-0.9 MG/50ML-% IV SOLN
20.0000 mg | Freq: Once | INTRAVENOUS | Status: AC
  Administered 2015-02-02: 20 mg via INTRAVENOUS

## 2015-02-02 MED ORDER — DEXTROSE 5 % IV SOLN
80.0000 mg/m2 | Freq: Once | INTRAVENOUS | Status: AC
  Administered 2015-02-02: 162 mg via INTRAVENOUS
  Filled 2015-02-02: qty 27

## 2015-02-02 MED ORDER — FAMOTIDINE IN NACL 20-0.9 MG/50ML-% IV SOLN
INTRAVENOUS | Status: AC
  Filled 2015-02-02: qty 50

## 2015-02-02 MED ORDER — DIPHENHYDRAMINE HCL 50 MG/ML IJ SOLN
25.0000 mg | Freq: Once | INTRAMUSCULAR | Status: AC
  Administered 2015-02-02: 25 mg via INTRAVENOUS

## 2015-02-02 MED ORDER — SODIUM CHLORIDE 0.9 % IV SOLN
Freq: Once | INTRAVENOUS | Status: AC
  Administered 2015-02-02: 15:00:00 via INTRAVENOUS
  Filled 2015-02-02: qty 4

## 2015-02-02 MED ORDER — SODIUM CHLORIDE 0.9 % IV SOLN
Freq: Once | INTRAVENOUS | Status: AC
  Administered 2015-02-02: 15:00:00 via INTRAVENOUS

## 2015-02-02 MED ORDER — DIPHENHYDRAMINE HCL 50 MG/ML IJ SOLN
INTRAMUSCULAR | Status: AC
  Filled 2015-02-02: qty 1

## 2015-02-02 NOTE — Patient Instructions (Signed)
[facility] Discharge Instructions for Patients Receiving Chemotherapy  Today you received the following chemotherapy agents:  Taxol  To help prevent nausea and vomiting after your treatment, we encourage you to take your nausea medication as prescribed.   If you develop nausea and vomiting that is not controlled by your nausea medication, call the clinic.   BELOW ARE SYMPTOMS THAT SHOULD BE REPORTED IMMEDIATELY:  *FEVER GREATER THAN 100.5 F  *CHILLS WITH OR WITHOUT FEVER  NAUSEA AND VOMITING THAT IS NOT CONTROLLED WITH YOUR NAUSEA MEDICATION  *UNUSUAL SHORTNESS OF BREATH  *UNUSUAL BRUISING OR BLEEDING  TENDERNESS IN MOUTH AND THROAT WITH OR WITHOUT PRESENCE OF ULCERS  *URINARY PROBLEMS  *BOWEL PROBLEMS  UNUSUAL RASH Items with * indicate a potential emergency and should be followed up as soon as possible.  Feel free to call the clinic you have any questions or concerns. The clinic phone number is [phone].  Please show the CHEMO ALERT CARD at check-in to the Emergency Department and triage nurse.   

## 2015-02-02 NOTE — Progress Notes (Signed)
Pt received 10 bus passes on 02/02/2015.

## 2015-02-02 NOTE — Progress Notes (Signed)
[place]  [phone]) [phone] Fax:(336) [phone]     ID: Laura Matthews DOB: [birth year]  MR#: [id number]  MVE#:[id number]  Patient Care Team: [name], MD as PCP - General (Family Medicine) Stark Klein, MD as Consulting Physician (General Surgery) Chauncey Cruel, MD as Consulting Physician (Oncology) Thea Silversmith, MD as Consulting Physician (Radiation Oncology) [facility] Bunnie Pion, NP as Nurse Practitioner (Nurse Practitioner)  CHIEF COMPLAINT: Stage III left breast cancer  CURRENT TREATMENT:adjuvant chemotherapy  BREAST CANCER HISTORY: From the original intake note:  The patient herself palpated a mass in her left breast and brought it to the attention of [facility] NP 04/22/2014. She palpated a large nontender mass at the 4:00 position as well as a small tender mass in the 6:00 position of the left breast. The patient was set up for imaging at the breast Center where on 05/05/2014 she underwent bilateral diagnostic mammography and left breast ultrasonography. The mammogram showed an irregular mass in the outer left breast with no other suspicious findings. This was firm and fixed at the 3:00 position approximately 10 cm from the nipple. Ultrasound showed a hypoechoic irregular solid mass in this area, measuring 1.8 cm. Ultrasound of the left axilla showed 2 suspicious level I lymph nodes, the largest measuring 1 cm, with a thickened pole. A smaller lymph node measured 0.6 cm but had no visible fatty hilum.  On 05/09/2014 the patient underwent biopsy of the left breast mass (she refused biopsy of the left axilla) which showed ([id number]) and invasive ductal carcinoma, grade 2, estrogen receptor positive at 100%, with strong staining intensity, progesterone receptor 80% positive, with weak staining intensity, with an MIB-1 of 80%, and no HER-2 amplification.  On 05/16/2014 the patient underwent bilateral breast MRI. This showed a 1.7 cm irregular enhancing  mass at the 3:00 position of the left breast associated with the biopsy cleft. There were 3 level I left axillary lymph nodes with thickened cortices. The largest measured 1.3 cm. There were no other findings of concern.  Her subsequent history is as detailed below.  INTERVAL HISTORY: Simranjit returns for follow up of her breast cancer. Today she is due for cycle 8 of paclitaxel. The interval history is generally unremarkable, but she says she feels "unlike herself" today, possibly just fatigued since her first treatment last week in almost a month.   REVIEW OF SYSTEMS:  Quana denies fevers, chills ,nausea, vomiting or changes in bowel or bladder habits. She has no shortness of breath, chest pain or palpitations. She denies mouth sores, rashes, or peripheral neuropathy symptoms. She endorses anxiety and depression since her cancer diagnosis, and has been "on edge" ever since. Her children are supportive, but obviously can't understand everything she is going through. She has tenderness to her left breast and a new shooting pain. A detailed review of systems was otherwise negative.   PAST MEDICAL HISTORY: Past Medical History  Diagnosis Date  . Mental disorder     depression-  . Arthritis   . Hot flashes   . Head ache   . Malignant neoplasm of breast (female), unspecified site   . Family history of malignant neoplasm of breast   . Depression     pt denies   . Anemia     low iron  . Newly diagnosed diabetes 11/12/2014    PAST SURGICAL HISTORY: Past Surgical History  Procedure Laterality Date  . Knee surgery  1995    right  . Foot surgery  2011  rt  . Tubal ligation    . Uterine fibroid surgery  2005    ablation  . Breast lumpectomy with sentinel lymph node biopsy Left 06/11/2014    Procedure: BREAST LUMPECTOMY WITH SENTINEL LYMPH NODE BX, POSSIBLE AXILLARY LYMPH NODE DISSECTION;  Surgeon: Stark Klein, MD;  Location: [place];  Service: General;  Laterality: Left;    . Portacath placement N/A 07/02/2014    Procedure: INSERTION PORT-A-CATH;  Surgeon: Stark Klein, MD;  Location: [place];  Service: General;  Laterality: N/A;  . Incision and drainage Left 07/02/2014    Procedure: Drainage left breast seroma;  Surgeon: Stark Klein, MD;  Location: [place];  Service: General;  Laterality: Left;  . Evacuation breast hematoma Left 07/10/2014    Procedure: EVACUATION OF LEFT BREAST HEMATOMA ;  Surgeon: Stark Klein, MD;  Location: [place];  Service: General;  Laterality: Left;  . Incision and drainage abscess N/A 11/12/2014    Procedure: ASPIRATION AND INCISION AND DRAINAGE LEFT BREAST ABSCESS;  Surgeon: Michael Boston, MD;  Location: [facility];  Service: General;  Laterality: N/A;    FAMILY HISTORY Family History  Problem Relation Age of Onset  . Breast cancer Mother 6    currently 15; TAH/BSO d/t ?cervical ca at 93  . Breast cancer Maternal Aunt     dx 27s; currently in her late 41s  . Lung cancer Father     deceased 54  . Diabetes Neg Hx    the patient's father died at the age of 68 from lung cancer. The patient's mother was diagnosed with breast cancer at the age of 33. She survives. The patient had 2 brothers, and 2 sisters. One brother may have a diagnosis of "stomach cancer"  GYNECOLOGIC HISTORY:  No LMP recorded. Patient has had an ablation. Menarche age 49 which is also when the patient first had a child. She is GX P3. She stopped having periods after laser ablation in 2005.  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 She has lived with her daughter [name] and her 4 children who are 58, 8, 5, and 4. 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 More recently the patient is living in a shelter. The patient's son [name] is a fast food cook in [place] and the patient's daughter [name] works as a Scientist, water quality, also in [place]. The patient has 7 grandchildren. She is not a church  attender    ADVANCED DIRECTIVES: Not in place; this was discussed with the patient on her first visit, 05/21/2014, and she indicated she plans to name her daughter [name] as her healthcare power of attorney   HEALTH MAINTENANCE: History  Substance Use Topics  . Smoking status: Current Every Day Smoker -- 0.25 packs/day    Types: Cigarettes  . Smokeless tobacco: Never Used  . Alcohol Use: No     Comment: Percoset,Vicodin, Oxycotin     Colonoscopy:  PAP:  Bone density:  Lipid panel:  Allergies  Allergen Reactions  . Hydrocodone Itching and Nausea Only    Current Outpatient Prescriptions  Medication Sig Dispense Refill  . ALPRAZolam (XANAX) 1 MG tablet   3  . blood glucose meter kit and supplies Dispense based on patient and insurance preference. Use up to four times daily as directed. (FOR ICD-9 250.00, 250.01). 1 each 0  . dexamethasone (DECADRON) 4 MG tablet TAKE 2 TABLETS DAILY ON THE DAY AFTER CHEMO THEN 2 TABLETS  2 TIMES DAILY FOR 2 DAYS WITH FOOD 30 tablet 0  . glipiZIDE (GLUCOTROL) 5 MG tablet Take 1 tablet (5 mg total) by mouth 2 (two) times daily before a meal. 60 tablet 3  . Insulin Glargine (LANTUS) 100 UNIT/ML Solostar Pen Inject 15 Units into the skin daily at 10 pm. 15 mL 11  . lidocaine-prilocaine (EMLA) cream Apply 1 application topically as needed. 30 g 0  . metFORMIN (GLUCOPHAGE-XR) 500 MG 24 hr tablet Take 1 tablet (500 mg total) by mouth 2 (two) times daily with a meal. 60 tablet 3  . naproxen (EC NAPROSYN) 500 MG EC tablet Take 1 tablet (500 mg total) by mouth 2 (two) times daily with a meal. 30 tablet 0  . omeprazole (PRILOSEC) 40 MG capsule Take 1 capsule (40 mg total) by mouth at bedtime. 30 capsule 4  . oxyCODONE-acetaminophen (PERCOCET) 7.5-325 MG per tablet Take 1 tablet by mouth every 4 (four) hours as needed for pain. 40 tablet 0  . ranitidine (ZANTAC) 150 MG tablet Take 1 tablet (150 mg total) by mouth 2 (two) times daily. 60 tablet 0  . Alum & Mag  Hydroxide-Simeth (MAGIC MOUTHWASH) SOLN Take 5 mLs by mouth 4 (four) times daily as needed for mouth pain.    Marland Kitchen bismuth subsalicylate (PEPTO BISMOL) 262 MG/15ML suspension Take 30 mLs by mouth every 6 (six) hours as needed for indigestion (indigestion).    Marland Kitchen ibuprofen (ADVIL,MOTRIN) 200 MG tablet Take 1 tablet (200 mg total) by mouth every 6 (six) hours as needed for moderate pain. (Patient not taking: Reported on 02/02/2015) 30 tablet 0  . lidocaine (LIDODERM) 5 % Place 1 patch onto the skin daily. Remove & Discard patch within 12 hours or as directed by MD (Patient not taking: Reported on 02/02/2015) 30 patch 0  . nicotine (NICODERM CQ) 21 mg/24hr patch Place 1 patch (21 mg total) onto the skin daily. (Patient not taking: Reported on 01/26/2015) 28 patch 1  . ondansetron (ZOFRAN ODT) 8 MG disintegrating tablet Take 1 tablet (8 mg total) by mouth every 8 (eight) hours as needed for nausea or vomiting. (Patient not taking: Reported on 01/26/2015) 20 tablet 0  . prochlorperazine (COMPAZINE) 10 MG tablet Take 1 tablet (10 mg total) by mouth every 6 (six) hours as needed for nausea or vomiting. (Patient not taking: Reported on 01/26/2015) 30 tablet 1   No current facility-administered medications for this visit.   Facility-Administered Medications Ordered in Other Visits  Medication Dose Route Frequency Provider Last Rate Last Dose  . 0.9 %  sodium chloride infusion   Intravenous Once Chauncey Cruel, MD      . diphenhydrAMINE (BENADRYL) injection 25 mg  25 mg Intravenous Once Chauncey Cruel, MD      . famotidine (PEPCID) IVPB 20 mg  20 mg Intravenous Once Chauncey Cruel, MD      . heparin lock flush 100 unit/mL  500 Units Intracatheter Once PRN Chauncey Cruel, MD      . ondansetron (ZOFRAN) 8 mg, dexamethasone (DECADRON) 4 mg in sodium chloride 0.9 % 50 mL IVPB   Intravenous Once Laurie Panda, NP      . PACLitaxel (TAXOL) 162 mg in dextrose 5 % 250 mL chemo infusion (</= 24m/m2)  80 mg/m2  (Treatment Plan Actual) Intravenous Once GChauncey Cruel MD      . sodium chloride 0.9 % injection 10 mL  10 mL Intracatheter PRN GChauncey Cruel MD   10 mL  at 07/29/14 1704  . sodium chloride 0.9 % injection 10 mL  10 mL Intracatheter PRN Chauncey Cruel, MD        OBJECTIVE: Middle-aged [place] American woman who appears stated age 23 Vitals:   02/02/15 1346  BP: 149/87  Pulse: 87  Temp: 98.1 F (36.7 C)  Resp: 20     Body mass index is 26.23 kg/(m^2).    ECOG FS:1 - Symptomatic but completely ambulatory  Skin: warm, dry  HEENT: sclerae anicteric, conjunctivae pink, oropharynx clear. No thrush or mucositis.  Lymph Nodes: No cervical or supraclavicular lymphadenopathy  Lungs: clear to auscultation bilaterally, no rales, wheezes, or rhonci  Heart: regular rate and rhythm  Abdomen: round, soft, non tender, positive bowel sounds  Musculoskeletal: No focal spinal tenderness, no peripheral edema  Neuro: non focal, well oriented, positive affect  Breasts: left breast status post lumpectomy. Palpable scar tissue along incision line. Tenderness throughout breast. Left axilla benign. Right breast unremarkable.  LAB RESULTS:  CMP     Component Value Date/Time   NA 140 02/02/2015 1406   NA 136 11/13/2014 1010   K 4.3 02/02/2015 1406   K 4.2 11/13/2014 1010   CL 101 11/13/2014 1010   CO2 28 02/02/2015 1406   CO2 29 11/13/2014 1010   GLUCOSE 138 02/02/2015 1406   GLUCOSE 191* 11/13/2014 1010   BUN 8.8 02/02/2015 1406   BUN 12 11/13/2014 1010   CREATININE 0.7 02/02/2015 1406   CREATININE 0.46* 11/13/2014 1010   CALCIUM 9.3 02/02/2015 1406   CALCIUM 8.5 11/13/2014 1010   PROT 7.3 02/02/2015 1406   PROT 6.5 09/17/2014 0700   ALBUMIN 4.1 02/02/2015 1406   ALBUMIN 3.2* 09/17/2014 0700   AST 16 02/02/2015 1406   AST 11 09/17/2014 0700   ALT 19 02/02/2015 1406   ALT 20 09/17/2014 0700   ALKPHOS 70 02/02/2015 1406   ALKPHOS 131* 09/17/2014 0700   BILITOT 0.27 02/02/2015  1406   BILITOT <0.2* 09/17/2014 0700   GFRNONAA >90 11/13/2014 1010   GFRAA >90 11/13/2014 1010    I No results found for: SPEP  Lab Results  Component Value Date   WBC 7.3 02/02/2015   NEUTROABS 4.9 02/02/2015   HGB 11.7 02/02/2015   HCT 36.7 02/02/2015   MCV 87.2 02/02/2015   PLT 371 02/02/2015      Chemistry      Component Value Date/Time   NA 140 02/02/2015 1406   NA 136 11/13/2014 1010   K 4.3 02/02/2015 1406   K 4.2 11/13/2014 1010   CL 101 11/13/2014 1010   CO2 28 02/02/2015 1406   CO2 29 11/13/2014 1010   BUN 8.8 02/02/2015 1406   BUN 12 11/13/2014 1010   CREATININE 0.7 02/02/2015 1406   CREATININE 0.46* 11/13/2014 1010      Component Value Date/Time   CALCIUM 9.3 02/02/2015 1406   CALCIUM 8.5 11/13/2014 1010   ALKPHOS 70 02/02/2015 1406   ALKPHOS 131* 09/17/2014 0700   AST 16 02/02/2015 1406   AST 11 09/17/2014 0700   ALT 19 02/02/2015 1406   ALT 20 09/17/2014 0700   BILITOT 0.27 02/02/2015 1406   BILITOT <0.2* 09/17/2014 0700       No results found for: LABCA2  No components found for: FEOFH219  No results for input(s): INR in the last 168 hours.  Urinalysis    Component Value Date/Time   COLORURINE YELLOW 06/03/2014 1050   APPEARANCEUR CLEAR 06/03/2014 1050   LABSPEC >1.030*  06/03/2014 1050   PHURINE 6.0 06/03/2014 1050   GLUCOSEU NEGATIVE 06/03/2014 1050   HGBUR SMALL* 06/03/2014 1050   BILIRUBINUR SMALL* 06/03/2014 1050   KETONESUR NEGATIVE 06/03/2014 1050   PROTEINUR NEGATIVE 06/03/2014 1050   UROBILINOGEN 0.2 06/03/2014 1050   NITRITE NEGATIVE 06/03/2014 1050   LEUKOCYTESUR TRACE* 06/03/2014 1050    STUDIES: No results found.  ASSESSMENT: 49 y.o. [place] woman status post left breast upper outer quadrant biopsy 05/09/2014 for a clinicalT1c N1, stage IIA invasive ductal carcinoma, grade 2, estrogen and progesterone receptor positive, HER-2 nonamplified, with an MIB-1 of 80%  (1) patient met with genetics counselor  05/29/2014 but decided against genetics testing   (2) tobacco abuse. The patient has been strongly urged to discontinue smoking. She was prescribed a nicotine patch 09/02/2014  (3) status post left lumpectomy and axillary lymph node dissection 06/11/2014 for a pT1c pN3, stage IIIC invasive ductal carcinoma, grade 3, with repeat HER-2 negative  (4) adjuvant chemotherapy to consist of doxorubicin and cyclophosphamide in dose dense fashion x4, with Neulasta support, started 07/29/2014, to be followed by paclitaxel weekly x12  (5) adjuvant radiation to follow chemotherapy  (6) anti-estrogens for 5-10 years to follow radiation  PLAN: Rashidah is doing well today. Because of scheduling conflicts the labs were not able to be drawn prior to this visit, I am sending her back to the phlebotomist now. The nurse will of course review the results before starting chemotherapy, and will alert me with any questions or concerns.   We discussed the lidocaine patches that were given to her previously by an ED physician. I educated her about proper timing and placement. She did need any refills on her percocet at this time. I explained to her that shooting pains in the surgical breast is typically normal, and may never go away.   Penda will return in 1 week for labs and cycle 9 of treatment. She understands and agrees with this plan. She knows the goal of treatment in her case is cure. She has been encouraged to call with any issues that might arise before her next visit here.    Laurie Panda, NP 02/02/2015

## 2015-02-09 ENCOUNTER — Other Ambulatory Visit (HOSPITAL_BASED_OUTPATIENT_CLINIC_OR_DEPARTMENT_OTHER): Payer: Medicaid Other

## 2015-02-09 ENCOUNTER — Encounter: Payer: Self-pay | Admitting: Nurse Practitioner

## 2015-02-09 ENCOUNTER — Other Ambulatory Visit: Payer: Self-pay | Admitting: *Deleted

## 2015-02-09 ENCOUNTER — Telehealth: Payer: Self-pay | Admitting: *Deleted

## 2015-02-09 ENCOUNTER — Ambulatory Visit (HOSPITAL_BASED_OUTPATIENT_CLINIC_OR_DEPARTMENT_OTHER): Payer: Medicaid Other | Admitting: Nurse Practitioner

## 2015-02-09 ENCOUNTER — Ambulatory Visit (HOSPITAL_BASED_OUTPATIENT_CLINIC_OR_DEPARTMENT_OTHER): Payer: Medicaid Other

## 2015-02-09 VITALS — BP 156/92 | HR 110 | Temp 98.6°F | Resp 18 | Ht 70.0 in | Wt 180.4 lb

## 2015-02-09 VITALS — BP 141/80 | HR 79

## 2015-02-09 DIAGNOSIS — C773 Secondary and unspecified malignant neoplasm of axilla and upper limb lymph nodes: Secondary | ICD-10-CM | POA: Diagnosis not present

## 2015-02-09 DIAGNOSIS — Z5111 Encounter for antineoplastic chemotherapy: Secondary | ICD-10-CM

## 2015-02-09 DIAGNOSIS — C50412 Malignant neoplasm of upper-outer quadrant of left female breast: Secondary | ICD-10-CM

## 2015-02-09 DIAGNOSIS — N644 Mastodynia: Secondary | ICD-10-CM

## 2015-02-09 DIAGNOSIS — I890 Lymphedema, not elsewhere classified: Secondary | ICD-10-CM

## 2015-02-09 DIAGNOSIS — C50911 Malignant neoplasm of unspecified site of right female breast: Secondary | ICD-10-CM

## 2015-02-09 DIAGNOSIS — Z95828 Presence of other vascular implants and grafts: Secondary | ICD-10-CM

## 2015-02-09 DIAGNOSIS — C50812 Malignant neoplasm of overlapping sites of left female breast: Secondary | ICD-10-CM | POA: Diagnosis present

## 2015-02-09 DIAGNOSIS — C50912 Malignant neoplasm of unspecified site of left female breast: Secondary | ICD-10-CM

## 2015-02-09 LAB — CBC WITH DIFFERENTIAL/PLATELET
BASO%: 0.4 % (ref 0.0–2.0)
Basophils Absolute: 0 10*3/uL (ref 0.0–0.1)
EOS%: 1.9 % (ref 0.0–7.0)
Eosinophils Absolute: 0.1 10*3/uL (ref 0.0–0.5)
HCT: 36.2 % (ref 34.8–46.6)
HGB: 12 g/dL (ref 11.6–15.9)
LYMPH%: 23.5 % (ref 14.0–49.7)
MCH: 28.9 pg (ref 25.1–34.0)
MCHC: 33.1 g/dL (ref 31.5–36.0)
MCV: 87.2 fL (ref 79.5–101.0)
MONO#: 0.4 10*3/uL (ref 0.1–0.9)
MONO%: 5.5 % (ref 0.0–14.0)
NEUT#: 4.7 10*3/uL (ref 1.5–6.5)
NEUT%: 68.7 % (ref 38.4–76.8)
Platelets: 385 10*3/uL (ref 145–400)
RBC: 4.15 10*6/uL (ref 3.70–5.45)
RDW: 15 % — ABNORMAL HIGH (ref 11.2–14.5)
WBC: 6.8 10*3/uL (ref 3.9–10.3)
lymph#: 1.6 10*3/uL (ref 0.9–3.3)

## 2015-02-09 LAB — COMPREHENSIVE METABOLIC PANEL (CC13)
ALT: 24 U/L (ref 0–55)
AST: 20 U/L (ref 5–34)
Albumin: 4.2 g/dL (ref 3.5–5.0)
Alkaline Phosphatase: 64 U/L (ref 40–150)
Anion Gap: 13 meq/L — ABNORMAL HIGH (ref 3–11)
BUN: 9.5 mg/dL (ref 7.0–26.0)
CO2: 26 meq/L (ref 22–29)
Calcium: 9.5 mg/dL (ref 8.4–10.4)
Chloride: 106 meq/L (ref 98–109)
Creatinine: 0.7 mg/dL (ref 0.6–1.1)
EGFR: >90 mL/min/{1.73_m2}
Glucose: 173 mg/dL — ABNORMAL HIGH (ref 70–140)
Potassium: 3.9 meq/L (ref 3.5–5.1)
Sodium: 145 meq/L (ref 136–145)
Total Bilirubin: 0.29 mg/dL (ref 0.20–1.20)
Total Protein: 7.5 g/dL (ref 6.4–8.3)

## 2015-02-09 MED ORDER — PACLITAXEL CHEMO INJECTION 300 MG/50ML
80.0000 mg/m2 | Freq: Once | INTRAVENOUS | Status: AC
  Administered 2015-02-09: 162 mg via INTRAVENOUS
  Filled 2015-02-09: qty 27

## 2015-02-09 MED ORDER — DIPHENHYDRAMINE HCL 50 MG/ML IJ SOLN
25.0000 mg | Freq: Once | INTRAMUSCULAR | Status: AC
  Administered 2015-02-09: 25 mg via INTRAVENOUS

## 2015-02-09 MED ORDER — DIPHENHYDRAMINE HCL 50 MG/ML IJ SOLN
INTRAMUSCULAR | Status: AC
  Filled 2015-02-09: qty 1

## 2015-02-09 MED ORDER — SODIUM CHLORIDE 0.9 % IJ SOLN
10.0000 mL | INTRAMUSCULAR | Status: DC | PRN
  Administered 2015-02-09: 10 mL
  Filled 2015-02-09: qty 10

## 2015-02-09 MED ORDER — FAMOTIDINE IN NACL 20-0.9 MG/50ML-% IV SOLN
20.0000 mg | Freq: Once | INTRAVENOUS | Status: AC
  Administered 2015-02-09: 20 mg via INTRAVENOUS

## 2015-02-09 MED ORDER — OXYCODONE-ACETAMINOPHEN 7.5-325 MG PO TABS: 1.0000 | ORAL_TABLET | ORAL | Status: DC | PRN

## 2015-02-09 MED ORDER — SODIUM CHLORIDE 0.9 % IV SOLN
Freq: Once | INTRAVENOUS | Status: AC
  Administered 2015-02-09: 15:00:00 via INTRAVENOUS

## 2015-02-09 MED ORDER — SODIUM CHLORIDE 0.9 % IV SOLN
Freq: Once | INTRAVENOUS | Status: AC
  Administered 2015-02-09: 16:00:00 via INTRAVENOUS
  Filled 2015-02-09: qty 4

## 2015-02-09 MED ORDER — SODIUM CHLORIDE 0.9 % IJ SOLN
10.0000 mL | INTRAMUSCULAR | Status: DC | PRN
  Administered 2015-02-09: 10 mL via INTRAVENOUS
  Filled 2015-02-09: qty 10

## 2015-02-09 MED ORDER — FAMOTIDINE IN NACL 20-0.9 MG/50ML-% IV SOLN
INTRAVENOUS | Status: AC
  Filled 2015-02-09: qty 50

## 2015-02-09 MED ORDER — HEPARIN SOD (PORK) LOCK FLUSH 100 UNIT/ML IV SOLN
500.0000 [IU] | Freq: Once | INTRAVENOUS | Status: AC | PRN
  Administered 2015-02-09: 500 [IU]
  Filled 2015-02-09: qty 5

## 2015-02-09 NOTE — Telephone Encounter (Signed)
Per staff message and POF I have scheduled appts. Advised scheduler of appts. JMW  

## 2015-02-09 NOTE — Progress Notes (Signed)
[place]  [phone] [phone]) [phone]     ID: Laura Matthews DOB: 1966-01-20  MR#: [id number]  LSL#:[id number]  Patient Care Team: Laura Hey, MD as PCP - General (Family Medicine) Laura Klein, MD as Consulting Physician (General Surgery) Laura Cruel, MD as Consulting Physician (Oncology) Laura Silversmith, MD as Consulting Physician (Radiation Oncology) [facility] Laura Pion, NP as Nurse Practitioner (Nurse Practitioner)  CHIEF COMPLAINT: Stage III left breast cancer  CURRENT TREATMENT:adjuvant chemotherapy  BREAST CANCER HISTORY: From the original intake note:  The patient herself palpated a mass in her left breast and brought it to the attention of [facility] NP 04/22/2014. She palpated a large nontender mass at the 4:00 position as well as a small tender mass in the 6:00 position of the left breast. The patient was set up for imaging at the breast Center where on 05/05/2014 she underwent bilateral diagnostic mammography and left breast ultrasonography. The mammogram showed an irregular mass in the outer left breast with no other suspicious findings. This was firm and fixed at the 3:00 position approximately 10 cm from the nipple. Ultrasound showed a hypoechoic irregular solid mass in this area, measuring 1.8 cm. Ultrasound of the left axilla showed 2 suspicious level I lymph nodes, the largest measuring 1 cm, with a thickened pole. A smaller lymph node measured 0.6 cm but had no visible fatty hilum.  On 05/09/2014 the patient underwent biopsy of the left breast mass (she refused biopsy of the left axilla) which showed ([id number]) and invasive ductal carcinoma, grade 2, estrogen receptor positive at 100%, with strong staining intensity, progesterone receptor 80% positive, with weak staining intensity, with an MIB-1 of 80%, and no HER-2 amplification.  On 05/16/2014 the patient underwent bilateral breast MRI. This showed a 1.7 cm irregular enhancing  mass at the 3:00 position of the left breast associated with the biopsy cleft. There were 3 level I left axillary lymph nodes with thickened cortices. The largest measured 1.3 cm. There were no other findings of concern.  Her subsequent history is as detailed below.  INTERVAL HISTORY: Laura Matthews returns for follow up of her breast cancer. Today she is due for cycle 9 of paclitaxel. This past week she was more nauseous than usual, and she even threw up once, despite using compazine. She believes a fair bit of this was reflux as this calmed some with omeprazole and pepto bismol. Also, she has been eating more fried fatty foods from fast food restaurants because she doesn't always have the energy to cook. Her blood pressure is elevated today, even manually, because she is going through some strain with her son. He has been living with her and keeping her place disorderly and not contributing to the finances.  REVIEW OF SYSTEMS:  Laura Matthews denies fevers, chills, or changes in bowel or bladder habits. She has no shortness of breath, chest pain or palpitations. She denies mouth sores, rashes, or peripheral neuropathy symptoms. She endorses anxiety and depression since her cancer diagnosis, and has been "on edge" ever since. She has persistent tenderness and mild swelling to her left breast.  A detailed review of systems was otherwise negative.   PAST MEDICAL HISTORY: Past Medical History  Diagnosis Date  . Mental disorder     depression-  . Arthritis   . Hot flashes   . Head ache   . Malignant neoplasm of breast (female), unspecified site   . Family history of malignant neoplasm of breast   . Depression  pt denies   . Anemia     low iron  . Newly diagnosed diabetes 11/12/2014    PAST SURGICAL HISTORY: Past Surgical History  Procedure Laterality Date  . Knee surgery  1995    right  . Foot surgery  2011    rt  . Tubal ligation    . Uterine fibroid surgery  2005    ablation  . Breast lumpectomy  with sentinel lymph node biopsy Left 06/11/2014    Procedure: BREAST LUMPECTOMY WITH SENTINEL LYMPH NODE BX, POSSIBLE AXILLARY LYMPH NODE DISSECTION;  Surgeon: Laura Klein, MD;  Location: [place];  Service: General;  Laterality: Left;  . Portacath placement N/A 07/02/2014    Procedure: INSERTION PORT-A-CATH;  Surgeon: Laura Klein, MD;  Location: [place];  Service: General;  Laterality: N/A;  . Incision and drainage Left 07/02/2014    Procedure: Drainage left breast seroma;  Surgeon: Laura Klein, MD;  Location: [place];  Service: General;  Laterality: Left;  . Evacuation breast hematoma Left 07/10/2014    Procedure: EVACUATION OF LEFT BREAST HEMATOMA ;  Surgeon: Laura Klein, MD;  Location: [place];  Service: General;  Laterality: Left;  . Incision and drainage abscess N/A 11/12/2014    Procedure: ASPIRATION AND INCISION AND DRAINAGE LEFT BREAST ABSCESS;  Surgeon: Michael Boston, MD;  Location: [facility];  Service: General;  Laterality: N/A;    FAMILY HISTORY Family History  Problem Relation Age of Onset  . Breast cancer Mother 50    currently 3; TAH/BSO d/t ?cervical ca at 59  . Breast cancer Maternal Aunt     dx 41s; currently in her late 20s  . Lung cancer Father     deceased 49  . Diabetes Neg Hx    the patient's father died at the age of 35 from lung cancer. The patient's mother was diagnosed with breast cancer at the age of 46. She survives. The patient had 2 brothers, and 2 sisters. One brother may have a diagnosis of "stomach cancer"  GYNECOLOGIC HISTORY:  No LMP recorded. Patient has had an ablation. Menarche age 71 which is also when the patient first had a child. She is GX P3. She stopped having periods after laser ablation in 2005.  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 She has lived with her daughter Laura Matthews and her 4 children who are 31, 8, 5, and 4. 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 More recently the patient is living in a shelter. The patient's son Laura Matthews is a fast food cook in [place] and the patient's daughter Laura Matthews works as a Scientist, water quality, also in [place]. The patient has 7 grandchildren. She is not a church attender    ADVANCED DIRECTIVES: Not in place; this was discussed with the patient on her first visit, 05/21/2014, and she indicated she plans to name her daughter Laura Matthews as her healthcare power of attorney   HEALTH MAINTENANCE: History  Substance Use Topics  . Smoking status: Current Every Day Smoker -- 0.25 packs/day    Types: Cigarettes  . Smokeless tobacco: Never Used  . Alcohol Use: No     Comment: Percoset,Vicodin, Oxycotin     Colonoscopy:  PAP:  Bone density:  Lipid panel:  Allergies  Allergen Reactions  . Hydrocodone Itching and Nausea Only    Current Outpatient Prescriptions  Medication Sig Dispense Refill  . ALPRAZolam (XANAX) 1 MG tablet   3  .  Alum & Mag Hydroxide-Simeth (MAGIC MOUTHWASH) SOLN Take 5 mLs by mouth 4 (four) times daily as needed for mouth pain.    Marland Kitchen bismuth subsalicylate (PEPTO BISMOL) 262 MG/15ML suspension Take 30 mLs by mouth every 6 (six) hours as needed for indigestion (indigestion).    . blood glucose meter kit and supplies Dispense based on patient and insurance preference. Use up to four times daily as directed. (FOR ICD-9 250.00, 250.01). 1 each 0  . dexamethasone (DECADRON) 4 MG tablet TAKE 2 TABLETS DAILY ON THE DAY AFTER CHEMO THEN 2 TABLETS 2 TIMES DAILY FOR 2 DAYS WITH FOOD 30 tablet 0  . glipiZIDE (GLUCOTROL) 5 MG tablet Take 1 tablet (5 mg total) by mouth 2 (two) times daily before a meal. 60 tablet 3  . Insulin Glargine (LANTUS) 100 UNIT/ML Solostar Pen Inject 15 Units into the skin daily at 10 pm. 15 mL 11  . lidocaine-prilocaine (EMLA) cream Apply 1 application topically as needed. 30 g 0  . metFORMIN (GLUCOPHAGE-XR) 500 MG 24 hr tablet Take 1 tablet (500 mg total) by mouth 2 (two)  times daily with a meal. 60 tablet 3  . naproxen (EC NAPROSYN) 500 MG EC tablet Take 1 tablet (500 mg total) by mouth 2 (two) times daily with a meal. 30 tablet 0  . omeprazole (PRILOSEC) 40 MG capsule Take 1 capsule (40 mg total) by mouth at bedtime. 30 capsule 4  . ondansetron (ZOFRAN ODT) 8 MG disintegrating tablet Take 1 tablet (8 mg total) by mouth every 8 (eight) hours as needed for nausea or vomiting. 20 tablet 0  . ranitidine (ZANTAC) 150 MG tablet Take 1 tablet (150 mg total) by mouth 2 (two) times daily. 60 tablet 0  . ibuprofen (ADVIL,MOTRIN) 200 MG tablet Take 1 tablet (200 mg total) by mouth every 6 (six) hours as needed for moderate pain. (Patient not taking: Reported on 02/02/2015) 30 tablet 0  . lidocaine (LIDODERM) 5 % Place 1 patch onto the skin daily. Remove & Discard patch within 12 hours or as directed by MD (Patient not taking: Reported on 02/02/2015) 30 patch 0  . nicotine (NICODERM CQ) 21 mg/24hr patch Place 1 patch (21 mg total) onto the skin daily. (Patient not taking: Reported on 01/26/2015) 28 patch 1  . oxyCODONE-acetaminophen (PERCOCET) 7.5-325 MG per tablet Take 1 tablet by mouth every 4 (four) hours as needed for pain. 40 tablet 0  . prochlorperazine (COMPAZINE) 10 MG tablet Take 1 tablet (10 mg total) by mouth every 6 (six) hours as needed for nausea or vomiting. (Patient not taking: Reported on 01/26/2015) 30 tablet 1   No current facility-administered medications for this visit.   Facility-Administered Medications Ordered in Other Visits  Medication Dose Route Frequency Provider Last Rate Last Dose  . sodium chloride 0.9 % injection 10 mL  10 mL Intracatheter PRN Laura Cruel, MD   10 mL at 07/29/14 1704    OBJECTIVE: Middle-aged [place] American woman who appears stated age 49 Vitals:   02/09/15 1329  BP: 147/96  Pulse: 110  Temp: 98.6 F (37 C)  Resp: 18     Body mass index is 25.88 kg/(m^2).    ECOG FS:1 - Symptomatic but completely  ambulatory  Sclerae unicteric, pupils equal and reactive Oropharynx clear and moist-- no thrush No cervical or supraclavicular adenopathy Lungs no rales or rhonchi Heart regular rate and rhythm Abd soft, nontender, positive bowel sounds MSK no focal spinal tenderness, no upper extremity lymphedema Neuro: nonfocal, well  oriented, appropriate affect Breasts: deferred  LAB RESULTS:  CMP     Component Value Date/Time   NA 145 02/09/2015 1305   NA 136 11/13/2014 1010   K 3.9 02/09/2015 1305   K 4.2 11/13/2014 1010   CL 101 11/13/2014 1010   CO2 26 02/09/2015 1305   CO2 29 11/13/2014 1010   GLUCOSE 173* 02/09/2015 1305   GLUCOSE 191* 11/13/2014 1010   BUN 9.5 02/09/2015 1305   BUN 12 11/13/2014 1010   CREATININE 0.7 02/09/2015 1305   CREATININE 0.46* 11/13/2014 1010   CALCIUM 9.5 02/09/2015 1305   CALCIUM 8.5 11/13/2014 1010   PROT 7.5 02/09/2015 1305   PROT 6.5 09/17/2014 0700   ALBUMIN 4.2 02/09/2015 1305   ALBUMIN 3.2* 09/17/2014 0700   AST 20 02/09/2015 1305   AST 11 09/17/2014 0700   ALT 24 02/09/2015 1305   ALT 20 09/17/2014 0700   ALKPHOS 64 02/09/2015 1305   ALKPHOS 131* 09/17/2014 0700   BILITOT 0.29 02/09/2015 1305   BILITOT <0.2* 09/17/2014 0700   GFRNONAA >90 11/13/2014 1010   GFRAA >90 11/13/2014 1010    I No results found for: SPEP  Lab Results  Component Value Date   WBC 6.8 02/09/2015   NEUTROABS 4.7 02/09/2015   HGB 12.0 02/09/2015   HCT 36.2 02/09/2015   MCV 87.2 02/09/2015   PLT 385 02/09/2015      Chemistry      Component Value Date/Time   NA 145 02/09/2015 1305   NA 136 11/13/2014 1010   K 3.9 02/09/2015 1305   K 4.2 11/13/2014 1010   CL 101 11/13/2014 1010   CO2 26 02/09/2015 1305   CO2 29 11/13/2014 1010   BUN 9.5 02/09/2015 1305   BUN 12 11/13/2014 1010   CREATININE 0.7 02/09/2015 1305   CREATININE 0.46* 11/13/2014 1010      Component Value Date/Time   CALCIUM 9.5 02/09/2015 1305   CALCIUM 8.5 11/13/2014 1010   ALKPHOS  64 02/09/2015 1305   ALKPHOS 131* 09/17/2014 0700   AST 20 02/09/2015 1305   AST 11 09/17/2014 0700   ALT 24 02/09/2015 1305   ALT 20 09/17/2014 0700   BILITOT 0.29 02/09/2015 1305   BILITOT <0.2* 09/17/2014 0700       No results found for: LABCA2  No components found for: [id number]  No results for input(s): INR in the last 168 hours.  Urinalysis    Component Value Date/Time   COLORURINE YELLOW 06/03/2014 1050   APPEARANCEUR CLEAR 06/03/2014 1050   LABSPEC >1.030* 06/03/2014 1050   PHURINE 6.0 06/03/2014 1050   GLUCOSEU NEGATIVE 06/03/2014 1050   HGBUR SMALL* 06/03/2014 1050   BILIRUBINUR SMALL* 06/03/2014 1050   KETONESUR NEGATIVE 06/03/2014 1050   PROTEINUR NEGATIVE 06/03/2014 1050   UROBILINOGEN 0.2 06/03/2014 1050   NITRITE NEGATIVE 06/03/2014 1050   LEUKOCYTESUR TRACE* 06/03/2014 1050    STUDIES: No results found.  ASSESSMENT: 49 y.o. [place] woman status post left breast upper outer quadrant biopsy 05/09/2014 for a clinicalT1c N1, stage IIA invasive ductal carcinoma, grade 2, estrogen and progesterone receptor positive, HER-2 nonamplified, with an MIB-1 of 80%  (1) patient met with genetics counselor 05/29/2014 but decided against genetics testing   (2) tobacco abuse. The patient has been strongly urged to discontinue smoking. She was prescribed a nicotine patch 09/02/2014  (3) status post left lumpectomy and axillary lymph node dissection 06/11/2014 for a pT1c pN3, stage IIIC invasive ductal carcinoma, grade 3, with repeat HER-2 negative  (  4) adjuvant chemotherapy to consist of doxorubicin and cyclophosphamide in dose dense fashion x4, with Neulasta support, started 07/29/2014, to be followed by paclitaxel weekly x12  (5) adjuvant radiation to follow chemotherapy  (6) anti-estrogens for 5-10 years to follow radiation  PLAN: Laura Matthews is feeling better at this time. The labs were reviewed in detail and were entirely stable. She will proceed with cycle 9 of  paclitaxel as planned today.   I have refilled her prescription of vicodin for her chronic left breast pain. At her next visit we will talk about moving away from narcotics for pain if possible.  Laura Matthews will return in 1 week for labs and cycle 10 of treatment. She understands and agrees with this plan. She knows the goal of treatment in her case is cure. She has been encouraged to call with any issues that might arise before her next visit here.   Laura Panda, NP 02/09/2015

## 2015-02-09 NOTE — Patient Instructions (Signed)
[facility] Discharge Instructions for Patients Receiving Chemotherapy  Today you received the following chemotherapy agents: Taxol  To help prevent nausea and vomiting after your treatment, we encourage you to take your nausea medication as prescribed by your physician.   If you develop nausea and vomiting that is not controlled by your nausea medication, call the clinic.   BELOW ARE SYMPTOMS THAT SHOULD BE REPORTED IMMEDIATELY:  *FEVER GREATER THAN 100.5 F  *CHILLS WITH OR WITHOUT FEVER  NAUSEA AND VOMITING THAT IS NOT CONTROLLED WITH YOUR NAUSEA MEDICATION  *UNUSUAL SHORTNESS OF BREATH  *UNUSUAL BRUISING OR BLEEDING  TENDERNESS IN MOUTH AND THROAT WITH OR WITHOUT PRESENCE OF ULCERS  *URINARY PROBLEMS  *BOWEL PROBLEMS  UNUSUAL RASH Items with * indicate a potential emergency and should be followed up as soon as possible.  Feel free to call the clinic you have any questions or concerns. The clinic phone number is [phone].  Please show the CHEMO ALERT CARD at check-in to the Emergency Department and triage nurse.   

## 2015-02-16 ENCOUNTER — Ambulatory Visit (HOSPITAL_BASED_OUTPATIENT_CLINIC_OR_DEPARTMENT_OTHER): Payer: Medicaid Other

## 2015-02-16 ENCOUNTER — Other Ambulatory Visit (HOSPITAL_BASED_OUTPATIENT_CLINIC_OR_DEPARTMENT_OTHER): Payer: Medicaid Other

## 2015-02-16 ENCOUNTER — Ambulatory Visit (HOSPITAL_BASED_OUTPATIENT_CLINIC_OR_DEPARTMENT_OTHER): Payer: Medicaid Other | Admitting: Oncology

## 2015-02-16 ENCOUNTER — Encounter: Payer: Self-pay | Admitting: Oncology

## 2015-02-16 VITALS — BP 133/72 | HR 101 | Temp 98.6°F | Resp 18 | Ht 70.0 in | Wt 186.4 lb

## 2015-02-16 DIAGNOSIS — Z801 Family history of malignant neoplasm of trachea, bronchus and lung: Secondary | ICD-10-CM | POA: Diagnosis not present

## 2015-02-16 DIAGNOSIS — F418 Other specified anxiety disorders: Secondary | ICD-10-CM | POA: Diagnosis not present

## 2015-02-16 DIAGNOSIS — Z720 Tobacco use: Secondary | ICD-10-CM | POA: Diagnosis not present

## 2015-02-16 DIAGNOSIS — C773 Secondary and unspecified malignant neoplasm of axilla and upper limb lymph nodes: Secondary | ICD-10-CM

## 2015-02-16 DIAGNOSIS — C50812 Malignant neoplasm of overlapping sites of left female breast: Secondary | ICD-10-CM

## 2015-02-16 DIAGNOSIS — C50412 Malignant neoplasm of upper-outer quadrant of left female breast: Secondary | ICD-10-CM

## 2015-02-16 DIAGNOSIS — Z5111 Encounter for antineoplastic chemotherapy: Secondary | ICD-10-CM

## 2015-02-16 DIAGNOSIS — Z803 Family history of malignant neoplasm of breast: Secondary | ICD-10-CM

## 2015-02-16 DIAGNOSIS — C50912 Malignant neoplasm of unspecified site of left female breast: Secondary | ICD-10-CM

## 2015-02-16 DIAGNOSIS — C50911 Malignant neoplasm of unspecified site of right female breast: Secondary | ICD-10-CM

## 2015-02-16 LAB — CBC WITH DIFFERENTIAL/PLATELET
BASO%: 0.8 % (ref 0.0–2.0)
BASOS ABS: 0 10*3/uL (ref 0.0–0.1)
EOS%: 2.6 % (ref 0.0–7.0)
Eosinophils Absolute: 0.1 10*3/uL (ref 0.0–0.5)
HEMATOCRIT: 35.3 % (ref 34.8–46.6)
HGB: 11.5 g/dL — ABNORMAL LOW (ref 11.6–15.9)
LYMPH#: 1.7 10*3/uL (ref 0.9–3.3)
LYMPH%: 29.3 % (ref 14.0–49.7)
MCH: 29 pg (ref 25.1–34.0)
MCHC: 32.6 g/dL (ref 31.5–36.0)
MCV: 88.9 fL (ref 79.5–101.0)
MONO#: 0.4 10*3/uL (ref 0.1–0.9)
MONO%: 7.7 % (ref 0.0–14.0)
NEUT#: 3.4 10*3/uL (ref 1.5–6.5)
NEUT%: 59.6 % (ref 38.4–76.8)
Platelets: 424 10*3/uL — ABNORMAL HIGH (ref 145–400)
RBC: 3.97 10*6/uL (ref 3.70–5.45)
RDW: 16 % — ABNORMAL HIGH (ref 11.2–14.5)
WBC: 5.7 10*3/uL (ref 3.9–10.3)

## 2015-02-16 MED ORDER — FAMOTIDINE IN NACL 20-0.9 MG/50ML-% IV SOLN
INTRAVENOUS | Status: AC
  Filled 2015-02-16: qty 50

## 2015-02-16 MED ORDER — SODIUM CHLORIDE 0.9 % IV SOLN
Freq: Once | INTRAVENOUS | Status: AC
  Administered 2015-02-16: 16:00:00 via INTRAVENOUS
  Filled 2015-02-16: qty 4

## 2015-02-16 MED ORDER — FAMOTIDINE IN NACL 20-0.9 MG/50ML-% IV SOLN
20.0000 mg | Freq: Once | INTRAVENOUS | Status: AC
  Administered 2015-02-16: 20 mg via INTRAVENOUS

## 2015-02-16 MED ORDER — PACLITAXEL CHEMO INJECTION 300 MG/50ML
80.0000 mg/m2 | Freq: Once | INTRAVENOUS | Status: AC
  Administered 2015-02-16: 162 mg via INTRAVENOUS
  Filled 2015-02-16: qty 27

## 2015-02-16 MED ORDER — SODIUM CHLORIDE 0.9 % IJ SOLN
10.0000 mL | INTRAMUSCULAR | Status: DC | PRN
  Administered 2015-02-16: 10 mL
  Filled 2015-02-16: qty 10

## 2015-02-16 MED ORDER — HEPARIN SOD (PORK) LOCK FLUSH 100 UNIT/ML IV SOLN
500.0000 [IU] | Freq: Once | INTRAVENOUS | Status: AC | PRN
  Administered 2015-02-16: 500 [IU]
  Filled 2015-02-16: qty 5

## 2015-02-16 MED ORDER — SODIUM CHLORIDE 0.9 % IV SOLN
Freq: Once | INTRAVENOUS | Status: AC
  Administered 2015-02-16: 15:00:00 via INTRAVENOUS

## 2015-02-16 MED ORDER — DIPHENHYDRAMINE HCL 50 MG/ML IJ SOLN
INTRAMUSCULAR | Status: AC
  Filled 2015-02-16: qty 1

## 2015-02-16 MED ORDER — DIPHENHYDRAMINE HCL 50 MG/ML IJ SOLN
25.0000 mg | Freq: Once | INTRAMUSCULAR | Status: AC
  Administered 2015-02-16: 25 mg via INTRAVENOUS

## 2015-02-16 NOTE — Progress Notes (Signed)
[facility]  Telephone:(336) [phone] Fax:(336) [phone]     ID: [name] DOB: 24-May-1966  MR#: [id number]  GDJ#:[id number]  Patient Care Team: [name], MD as PCP - General (Family Medicine) Stark Klein, MD as Consulting Physician (General Surgery) Chauncey Cruel, MD as Consulting Physician (Oncology) Thea Silversmith, MD as Consulting Physician (Radiation Oncology) [facility] Bunnie Pion, NP as Nurse Practitioner (Nurse Practitioner)  CHIEF COMPLAINT: Stage III left breast cancer  CURRENT TREATMENT:adjuvant chemotherapy  BREAST CANCER HISTORY: From the original intake note:  The patient herself palpated a mass in her left breast and brought it to the attention of [facility] NP 04/22/2014. She palpated a large nontender mass at the 4:00 position as well as a small tender mass in the 6:00 position of the left breast. The patient was set up for imaging at the breast Center where on 05/05/2014 she underwent bilateral diagnostic mammography and left breast ultrasonography. The mammogram showed an irregular mass in the outer left breast with no other suspicious findings. This was firm and fixed at the 3:00 position approximately 10 cm from the nipple. Ultrasound showed a hypoechoic irregular solid mass in this area, measuring 1.8 cm. Ultrasound of the left axilla showed 2 suspicious level I lymph nodes, the largest measuring 1 cm, with a thickened pole. A smaller lymph node measured 0.6 cm but had no visible fatty hilum.  On 05/09/2014 the patient underwent biopsy of the left breast mass (she refused biopsy of the left axilla) which showed ([id number]) and invasive ductal carcinoma, grade 2, estrogen receptor positive at 100%, with strong staining intensity, progesterone receptor 80% positive, with weak staining intensity, with an MIB-1 of 80%, and no HER-2 amplification.  On 05/16/2014 the patient underwent bilateral breast MRI. This showed a 1.7 cm irregular enhancing  mass at the 3:00 position of the left breast associated with the biopsy cleft. There were 3 level I left axillary lymph nodes with thickened cortices. The largest measured 1.3 cm. There were no other findings of concern.  Her subsequent history is as detailed below.  INTERVAL HISTORY: Tannisha returns for follow up of her breast cancer. Today she is due for cycle 10 of paclitaxel. She has had a good week this past week with no nausea or vomiting. Still taking omeprazole and pepto bismol as needed for heartburn. Feels that her breast tenderness is improving.   REVIEW OF SYSTEMS:  Glennette denies fevers, chills, or changes in bowel or bladder habits. She has no shortness of breath, chest pain or palpitations. She denies mouth sores, rashes, or peripheral neuropathy symptoms. She endorses anxiety and depression since her cancer diagnosis, and has been "on edge" ever since. She has persistent tenderness and mild swelling to her left breast, but this is improving.  A detailed review of systems was otherwise negative.   PAST MEDICAL HISTORY: Past Medical History  Diagnosis Date  . Mental disorder     depression-  . Arthritis   . Hot flashes   . Head ache   . Malignant neoplasm of breast (female), unspecified site   . Family history of malignant neoplasm of breast   . Depression     pt denies   . Anemia     low iron  . Newly diagnosed diabetes 11/12/2014    PAST SURGICAL HISTORY: Past Surgical History  Procedure Laterality Date  . Knee surgery  1995    right  . Foot surgery  2011    rt  . Tubal  ligation    . Uterine fibroid surgery  2005    ablation  . Breast lumpectomy with sentinel lymph node biopsy Left 06/11/2014    Procedure: BREAST LUMPECTOMY WITH SENTINEL LYMPH NODE BX, POSSIBLE AXILLARY LYMPH NODE DISSECTION;  Surgeon: Stark Klein, MD;  Location: [facility];  Service: General;  Laterality: Left;  . Portacath placement N/A 07/02/2014    Procedure: INSERTION PORT-A-CATH;   Surgeon: Stark Klein, MD;  Location: [facility];  Service: General;  Laterality: N/A;  . Incision and drainage Left 07/02/2014    Procedure: Drainage left breast seroma;  Surgeon: Stark Klein, MD;  Location: [facility];  Service: General;  Laterality: Left;  . Evacuation breast hematoma Left 07/10/2014    Procedure: EVACUATION OF LEFT BREAST HEMATOMA ;  Surgeon: Stark Klein, MD;  Location: [facility];  Service: General;  Laterality: Left;  . Incision and drainage abscess N/A 11/12/2014    Procedure: ASPIRATION AND INCISION AND DRAINAGE LEFT BREAST ABSCESS;  Surgeon: Michael Boston, MD;  Location: [facility];  Service: General;  Laterality: N/A;    FAMILY HISTORY Family History  Problem Relation Age of Onset  . Breast cancer Mother 22    currently 37; TAH/BSO d/t ?cervical ca at 43  . Breast cancer Maternal Aunt     dx 35s; currently in her late 4s  . Lung cancer Father     deceased 19  . Diabetes Neg Hx    the patient's father died at the age of 3 from lung cancer. The patient's mother was diagnosed with breast cancer at the age of 25. She survives. The patient had 2 brothers, and 2 sisters. One brother may have a diagnosis of "stomach cancer"  GYNECOLOGIC HISTORY:  No LMP recorded. Patient has had an ablation. Menarche age 42 which is also when the patient first had a child. She is GX P3. She stopped having periods after laser ablation in 2005.  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 She has lived with her daughter [name] and her 4 children who are 99, 8, 5, and 4. 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 More recently the patient is living in a shelter. The patient's son [name] is a fast food cook in [place] and the patient's daughter [name] works as a Scientist, water quality, also in [place]. The patient has 7 grandchildren. She is not a church attender    ADVANCED DIRECTIVES: Not in place; this was discussed with the  patient on her first visit, 05/21/2014, and she indicated she plans to name her daughter [name] as her healthcare power of attorney   HEALTH MAINTENANCE: History  Substance Use Topics  . Smoking status: Current Every Day Smoker -- 0.25 packs/day    Types: Cigarettes  . Smokeless tobacco: Never Used  . Alcohol Use: No     Comment: Percoset,Vicodin, Oxycotin     Colonoscopy:  PAP:  Bone density:  Lipid panel:  Allergies  Allergen Reactions  . Hydrocodone Itching and Nausea Only    Current Outpatient Prescriptions  Medication Sig Dispense Refill  . ALPRAZolam (XANAX) 1 MG tablet   3  . Alum & Mag Hydroxide-Simeth (MAGIC MOUTHWASH) SOLN Take 5 mLs by mouth 4 (four) times daily as needed for mouth pain.    Marland Kitchen bismuth subsalicylate (PEPTO BISMOL) 262 MG/15ML suspension Take 30 mLs by mouth every 6 (six) hours as needed for indigestion (indigestion).    . blood glucose  meter kit and supplies Dispense based on patient and insurance preference. Use up to four times daily as directed. (FOR ICD-9 250.00, 250.01). 1 each 0  . dexamethasone (DECADRON) 4 MG tablet TAKE 2 TABLETS DAILY ON THE DAY AFTER CHEMO THEN 2 TABLETS 2 TIMES DAILY FOR 2 DAYS WITH FOOD 30 tablet 0  . glipiZIDE (GLUCOTROL) 5 MG tablet Take 1 tablet (5 mg total) by mouth 2 (two) times daily before a meal. 60 tablet 3  . ibuprofen (ADVIL,MOTRIN) 200 MG tablet Take 1 tablet (200 mg total) by mouth every 6 (six) hours as needed for moderate pain. (Patient not taking: Reported on 02/02/2015) 30 tablet 0  . Insulin Glargine (LANTUS) 100 UNIT/ML Solostar Pen Inject 15 Units into the skin daily at 10 pm. 15 mL 11  . lidocaine (LIDODERM) 5 % Place 1 patch onto the skin daily. Remove & Discard patch within 12 hours or as directed by MD (Patient not taking: Reported on 02/02/2015) 30 patch 0  . lidocaine-prilocaine (EMLA) cream Apply 1 application topically as needed. 30 g 0  . metFORMIN (GLUCOPHAGE-XR) 500 MG 24 hr tablet Take 1 tablet  (500 mg total) by mouth 2 (two) times daily with a meal. 60 tablet 3  . naproxen (EC NAPROSYN) 500 MG EC tablet Take 1 tablet (500 mg total) by mouth 2 (two) times daily with a meal. 30 tablet 0  . nicotine (NICODERM CQ) 21 mg/24hr patch Place 1 patch (21 mg total) onto the skin daily. (Patient not taking: Reported on 01/26/2015) 28 patch 1  . omeprazole (PRILOSEC) 40 MG capsule Take 1 capsule (40 mg total) by mouth at bedtime. 30 capsule 4  . ondansetron (ZOFRAN ODT) 8 MG disintegrating tablet Take 1 tablet (8 mg total) by mouth every 8 (eight) hours as needed for nausea or vomiting. 20 tablet 0  . oxyCODONE-acetaminophen (PERCOCET) 7.5-325 MG per tablet Take 1 tablet by mouth every 4 (four) hours as needed for pain. 40 tablet 0  . prochlorperazine (COMPAZINE) 10 MG tablet Take 1 tablet (10 mg total) by mouth every 6 (six) hours as needed for nausea or vomiting. (Patient not taking: Reported on 01/26/2015) 30 tablet 1  . ranitidine (ZANTAC) 150 MG tablet Take 1 tablet (150 mg total) by mouth 2 (two) times daily. 60 tablet 0   No current facility-administered medications for this visit.   Facility-Administered Medications Ordered in Other Visits  Medication Dose Route Frequency Provider Last Rate Last Dose  . heparin lock flush 100 unit/mL  500 Units Intracatheter Once PRN Chauncey Cruel, MD      . PACLitaxel (TAXOL) 162 mg in dextrose 5 % 250 mL chemo infusion (</= 46m/m2)  80 mg/m2 (Treatment Plan Actual) Intravenous Once GChauncey Cruel MD 277 mL/hr at 02/16/15 1604 162 mg at 02/16/15 1604  . sodium chloride 0.9 % injection 10 mL  10 mL Intracatheter PRN GChauncey Cruel MD   10 mL at 07/29/14 1704  . sodium chloride 0.9 % injection 10 mL  10 mL Intracatheter PRN GChauncey Cruel MD        OBJECTIVE: Middle-aged [place] woman who appears stated age F32Vitals:   02/16/15 1455  BP: 133/72  Pulse: 101  Temp: 98.6 F (37 C)  Resp: 18     Body mass index is 26.75  kg/(m^2).    ECOG FS:1 - Symptomatic but completely ambulatory  Sclerae unicteric, pupils equal and reactive Oropharynx clear and moist-- no thrush No cervical or supraclavicular  adenopathy Lungs no rales or rhonchi Heart regular rate and rhythm Abd soft, nontender, positive bowel sounds MSK no focal spinal tenderness, no upper extremity lymphedema Neuro: nonfocal, well oriented, appropriate affect Breasts: deferred  LAB RESULTS:  CMP     Component Value Date/Time   NA 145 02/09/2015 1305   NA 136 11/13/2014 1010   K 3.9 02/09/2015 1305   K 4.2 11/13/2014 1010   CL 101 11/13/2014 1010   CO2 26 02/09/2015 1305   CO2 29 11/13/2014 1010   GLUCOSE 173* 02/09/2015 1305   GLUCOSE 191* 11/13/2014 1010   BUN 9.5 02/09/2015 1305   BUN 12 11/13/2014 1010   CREATININE 0.7 02/09/2015 1305   CREATININE 0.46* 11/13/2014 1010   CALCIUM 9.5 02/09/2015 1305   CALCIUM 8.5 11/13/2014 1010   PROT 7.5 02/09/2015 1305   PROT 6.5 09/17/2014 0700   ALBUMIN 4.2 02/09/2015 1305   ALBUMIN 3.2* 09/17/2014 0700   AST 20 02/09/2015 1305   AST 11 09/17/2014 0700   ALT 24 02/09/2015 1305   ALT 20 09/17/2014 0700   ALKPHOS 64 02/09/2015 1305   ALKPHOS 131* 09/17/2014 0700   BILITOT 0.29 02/09/2015 1305   BILITOT <0.2* 09/17/2014 0700   GFRNONAA >90 11/13/2014 1010   GFRAA >90 11/13/2014 1010    I No results found for: SPEP  Lab Results  Component Value Date   WBC 5.7 02/16/2015   NEUTROABS 3.4 02/16/2015   HGB 11.5* 02/16/2015   HCT 35.3 02/16/2015   MCV 88.9 02/16/2015   PLT 424* 02/16/2015      Chemistry      Component Value Date/Time   NA 145 02/09/2015 1305   NA 136 11/13/2014 1010   K 3.9 02/09/2015 1305   K 4.2 11/13/2014 1010   CL 101 11/13/2014 1010   CO2 26 02/09/2015 1305   CO2 29 11/13/2014 1010   BUN 9.5 02/09/2015 1305   BUN 12 11/13/2014 1010   CREATININE 0.7 02/09/2015 1305   CREATININE 0.46* 11/13/2014 1010      Component Value Date/Time   CALCIUM 9.5  02/09/2015 1305   CALCIUM 8.5 11/13/2014 1010   ALKPHOS 64 02/09/2015 1305   ALKPHOS 131* 09/17/2014 0700   AST 20 02/09/2015 1305   AST 11 09/17/2014 0700   ALT 24 02/09/2015 1305   ALT 20 09/17/2014 0700   BILITOT 0.29 02/09/2015 1305   BILITOT <0.2* 09/17/2014 0700       No results found for: LABCA2  No components found for: GQBVQ945  No results for input(s): INR in the last 168 hours.  Urinalysis    Component Value Date/Time   COLORURINE YELLOW 06/03/2014 1050   APPEARANCEUR CLEAR 06/03/2014 1050   LABSPEC >1.030* 06/03/2014 1050   PHURINE 6.0 06/03/2014 1050   GLUCOSEU NEGATIVE 06/03/2014 1050   HGBUR SMALL* 06/03/2014 1050   BILIRUBINUR SMALL* 06/03/2014 1050   KETONESUR NEGATIVE 06/03/2014 1050   PROTEINUR NEGATIVE 06/03/2014 1050   UROBILINOGEN 0.2 06/03/2014 1050   NITRITE NEGATIVE 06/03/2014 1050   LEUKOCYTESUR TRACE* 06/03/2014 1050    STUDIES: No results found.  ASSESSMENT: 49 y.o. [place] woman status post left breast upper outer quadrant biopsy 05/09/2014 for a clinicalT1c N1, stage IIA invasive ductal carcinoma, grade 2, estrogen and progesterone receptor positive, HER-2 nonamplified, with an MIB-1 of 80%  (1) patient met with genetics counselor 05/29/2014 but decided against genetics testing   (2) tobacco abuse. The patient has been strongly urged to discontinue smoking. She was prescribed a nicotine  patch 09/02/2014  (3) status post left lumpectomy and axillary lymph node dissection 06/11/2014 for a pT1c pN3, stage IIIC invasive ductal carcinoma, grade 3, with repeat HER-2 negative  (4) adjuvant chemotherapy to consist of doxorubicin and cyclophosphamide in dose dense fashion x4, with Neulasta support, started 07/29/2014, to be followed by paclitaxel weekly x12  (5) adjuvant radiation to follow chemotherapy  (6) anti-estrogens for 5-10 years to follow radiation  PLAN: Stepheny is feeling well today labs were reviewed in detail and were entirely  stable. She will proceed with cycle 10 of paclitaxel as planned today.   Jakaylee will return in 1 week for labs and cycle 11 of treatment. She understands and agrees with this plan. She knows the goal of treatment in her case is cure. She has been encouraged to call with any issues that might arise before her next visit here.   Mikey Bussing, NP 02/16/2015

## 2015-02-16 NOTE — Patient Instructions (Addendum)
[facility] Discharge Instructions for Patients Receiving Chemotherapy  Today you received the following chemotherapy agents: Taxol.   To help prevent nausea and vomiting after your treatment, we encourage you to take your nausea medication as directed.    If you develop nausea and vomiting that is not controlled by your nausea medication, call the clinic.   BELOW ARE SYMPTOMS THAT SHOULD BE REPORTED IMMEDIATELY:  *FEVER GREATER THAN 100.5 F  *CHILLS WITH OR WITHOUT FEVER  NAUSEA AND VOMITING THAT IS NOT CONTROLLED WITH YOUR NAUSEA MEDICATION  *UNUSUAL SHORTNESS OF BREATH  *UNUSUAL BRUISING OR BLEEDING  TENDERNESS IN MOUTH AND THROAT WITH OR WITHOUT PRESENCE OF ULCERS  *URINARY PROBLEMS  *BOWEL PROBLEMS  UNUSUAL RASH Items with * indicate a potential emergency and should be followed up as soon as possible.  Feel free to call the clinic you have any questions or concerns. The clinic phone number is [phone].  Please show the [facility] at check-in to the Emergency Department and triage nurse.  Paclitaxel injection What is this medicine? PACLITAXEL (PAK li TAX el) is a chemotherapy drug. It targets fast dividing cells, like cancer cells, and causes these cells to die. This medicine is used to treat ovarian cancer, breast cancer, and other cancers. This medicine may be used for other purposes; ask your health care provider or pharmacist if you have questions. COMMON BRAND NAME(S): Onxol, Taxol What should I tell my health care provider before I take this medicine? They need to know if you have any of these conditions: -blood disorders -irregular heartbeat -infection (especially a virus infection such as chickenpox, cold sores, or herpes) -liver disease -previous or ongoing radiation therapy -an unusual or allergic reaction to paclitaxel, alcohol, polyoxyethylated castor oil, other chemotherapy agents, other medicines, foods, dyes, or  preservatives -pregnant or trying to get pregnant -breast-feeding How should I use this medicine? This drug is given as an infusion into a vein. It is administered in a hospital or clinic by a specially trained health care professional. Talk to your pediatrician regarding the use of this medicine in children. Special care may be needed. Overdosage: If you think you have taken too much of this medicine contact a poison control center or emergency room at once. NOTE: This medicine is only for you. Do not share this medicine with others. What if I miss a dose? It is important not to miss your dose. Call your doctor or health care professional if you are unable to keep an appointment. What may interact with this medicine? Do not take this medicine with any of the following medications: -disulfiram -metronidazole This medicine may also interact with the following medications: -cyclosporine -diazepam -ketoconazole -medicines to increase blood counts like filgrastim, pegfilgrastim, sargramostim -other chemotherapy drugs like cisplatin, doxorubicin, epirubicin, etoposide, teniposide, vincristine -quinidine -testosterone -vaccines -verapamil Talk to your doctor or health care professional before taking any of these medicines: -acetaminophen -aspirin -ibuprofen -ketoprofen -naproxen This list may not describe all possible interactions. Give your health care provider a list of all the medicines, herbs, non-prescription drugs, or dietary supplements you use. Also tell them if you smoke, drink alcohol, or use illegal drugs. Some items may interact with your medicine. What should I watch for while using this medicine? Your condition will be monitored carefully while you are receiving this medicine. You will need important blood work done while you are taking this medicine. This drug may make you feel generally unwell. This is not uncommon, as chemotherapy can  affect healthy cells as well as cancer  cells. Report any side effects. Continue your course of treatment even though you feel ill unless your doctor tells you to stop. In some cases, you may be given additional medicines to help with side effects. Follow all directions for their use. Call your doctor or health care professional for advice if you get a fever, chills or sore throat, or other symptoms of a cold or flu. Do not treat yourself. This drug decreases your body's ability to fight infections. Try to avoid being around people who are sick. This medicine may increase your risk to bruise or bleed. Call your doctor or health care professional if you notice any unusual bleeding. Be careful brushing and flossing your teeth or using a toothpick because you may get an infection or bleed more easily. If you have any dental work done, tell your dentist you are receiving this medicine. Avoid taking products that contain aspirin, acetaminophen, ibuprofen, naproxen, or ketoprofen unless instructed by your doctor. These medicines may hide a fever. Do not become pregnant while taking this medicine. Women should inform their doctor if they wish to become pregnant or think they might be pregnant. There is a potential for serious side effects to an unborn child. Talk to your health care professional or pharmacist for more information. Do not breast-feed an infant while taking this medicine. Men are advised not to father a child while receiving this medicine. What side effects may I notice from receiving this medicine? Side effects that you should report to your doctor or health care professional as soon as possible: -allergic reactions like skin rash, itching or hives, swelling of the face, lips, or tongue -low blood counts - This drug may decrease the number of white blood cells, red blood cells and platelets. You may be at increased risk for infections and bleeding. -signs of infection - fever or chills, cough, sore throat, pain or difficulty passing  urine -signs of decreased platelets or bleeding - bruising, pinpoint red spots on the skin, black, tarry stools, nosebleeds -signs of decreased red blood cells - unusually weak or tired, fainting spells, lightheadedness -breathing problems -chest pain -high or low blood pressure -mouth sores -nausea and vomiting -pain, swelling, redness or irritation at the injection site -pain, tingling, numbness in the hands or feet -slow or irregular heartbeat -swelling of the ankle, feet, hands Side effects that usually do not require medical attention (report to your doctor or health care professional if they continue or are bothersome): -bone pain -complete hair loss including hair on your head, underarms, pubic hair, eyebrows, and eyelashes -changes in the color of fingernails -diarrhea -loosening of the fingernails -loss of appetite -muscle or joint pain -red flush to skin -sweating This list may not describe all possible side effects. Call your doctor for medical advice about side effects. You may report side effects to FDA at 1-800-FDA-1088. Where should I keep my medicine? This drug is given in a hospital or clinic and will not be stored at home. NOTE: This sheet is a summary. It may not cover all possible information. If you have questions about this medicine, talk to your doctor, pharmacist, or health care provider.  2015, Elsevier/Gold Standard. (2012-12-10 16:41:21)

## 2015-02-23 ENCOUNTER — Other Ambulatory Visit: Payer: Self-pay | Admitting: Nurse Practitioner

## 2015-02-23 ENCOUNTER — Telehealth: Payer: Self-pay | Admitting: Nurse Practitioner

## 2015-02-23 ENCOUNTER — Telehealth: Payer: Self-pay | Admitting: *Deleted

## 2015-02-23 ENCOUNTER — Ambulatory Visit: Payer: Self-pay | Admitting: Nurse Practitioner

## 2015-02-23 ENCOUNTER — Ambulatory Visit: Payer: Self-pay

## 2015-02-23 ENCOUNTER — Other Ambulatory Visit: Payer: Self-pay

## 2015-02-23 NOTE — Telephone Encounter (Signed)
per pof to sch pt appt-sent email to MW to sch trmt-pt to get updated sch on 03/02/15

## 2015-02-23 NOTE — Telephone Encounter (Signed)
Called pt to inform her I was unable to reschedule the appts she missed on today. Pt will return next week on 5/2 for labs, appt with Mikey Bussing, NP and tx. Pt was fine with that and verbalized understanding. No further concerns. Message forwarded to Gentry Fitz, NP.

## 2015-02-23 NOTE — Telephone Encounter (Signed)
Called pt to inquire why she did not come to appts today. Pt said she fell this weekend and scraped her right elbow. Pt said she is sore and her back is hurting. Pt said she call us at [phone] but call did not go through to let us know she would not be here today. We will try to r/s this appt. Message to be forwarded to [facility].

## 2015-03-02 ENCOUNTER — Ambulatory Visit (HOSPITAL_COMMUNITY)
Admission: RE | Admit: 2015-03-02 | Discharge: 2015-03-02 | Disposition: A | Discharge status: Home / Self Care | Payer: Medicaid Other | Source: Ambulatory Visit | Attending: Oncology | Admitting: Oncology

## 2015-03-02 ENCOUNTER — Encounter: Payer: Self-pay | Admitting: Oncology

## 2015-03-02 ENCOUNTER — Other Ambulatory Visit: Payer: Self-pay | Admitting: Oncology

## 2015-03-02 ENCOUNTER — Other Ambulatory Visit (HOSPITAL_BASED_OUTPATIENT_CLINIC_OR_DEPARTMENT_OTHER): Payer: Medicaid Other

## 2015-03-02 ENCOUNTER — Ambulatory Visit (HOSPITAL_BASED_OUTPATIENT_CLINIC_OR_DEPARTMENT_OTHER): Payer: Medicaid Other

## 2015-03-02 ENCOUNTER — Ambulatory Visit (HOSPITAL_BASED_OUTPATIENT_CLINIC_OR_DEPARTMENT_OTHER): Payer: Medicaid Other | Admitting: Oncology

## 2015-03-02 VITALS — BP 142/81 | HR 85 | Temp 98.1°F | Resp 18 | Ht 70.0 in | Wt 184.1 lb

## 2015-03-02 DIAGNOSIS — Z720 Tobacco use: Secondary | ICD-10-CM | POA: Diagnosis not present

## 2015-03-02 DIAGNOSIS — R21 Rash and other nonspecific skin eruption: Secondary | ICD-10-CM

## 2015-03-02 DIAGNOSIS — F418 Other specified anxiety disorders: Secondary | ICD-10-CM | POA: Diagnosis not present

## 2015-03-02 DIAGNOSIS — C773 Secondary and unspecified malignant neoplasm of axilla and upper limb lymph nodes: Secondary | ICD-10-CM

## 2015-03-02 DIAGNOSIS — C50912 Malignant neoplasm of unspecified site of left female breast: Secondary | ICD-10-CM

## 2015-03-02 DIAGNOSIS — C50412 Malignant neoplasm of upper-outer quadrant of left female breast: Secondary | ICD-10-CM

## 2015-03-02 DIAGNOSIS — Z5111 Encounter for antineoplastic chemotherapy: Secondary | ICD-10-CM

## 2015-03-02 DIAGNOSIS — C50812 Malignant neoplasm of overlapping sites of left female breast: Secondary | ICD-10-CM

## 2015-03-02 DIAGNOSIS — N644 Mastodynia: Secondary | ICD-10-CM | POA: Diagnosis not present

## 2015-03-02 DIAGNOSIS — C50911 Malignant neoplasm of unspecified site of right female breast: Secondary | ICD-10-CM

## 2015-03-02 LAB — COMPREHENSIVE METABOLIC PANEL (CC13)
ALT: 25 U/L (ref 0–55)
ANION GAP: 11 meq/L (ref 3–11)
AST: 18 U/L (ref 5–34)
Albumin: 4.1 g/dL (ref 3.5–5.0)
Alkaline Phosphatase: 68 U/L (ref 40–150)
BUN: 10.4 mg/dL (ref 7.0–26.0)
CO2: 27 mEq/L (ref 22–29)
CREATININE: 0.6 mg/dL (ref 0.6–1.1)
Calcium: 9.4 mg/dL (ref 8.4–10.4)
Chloride: 102 mEq/L (ref 98–109)
EGFR: >90 mL/min/{1.73_m2} (ref >90)
Glucose: 91 mg/dl (ref 70–140)
Potassium: 4.1 mEq/L (ref 3.5–5.1)
Sodium: 141 mEq/L (ref 136–145)
Total Bilirubin: 0.26 mg/dL (ref 0.20–1.20)
Total Protein: 7.3 g/dL (ref 6.4–8.3)

## 2015-03-02 LAB — CBC WITH DIFFERENTIAL/PLATELET
BASO%: 0.6 % (ref 0.0–2.0)
Basophils Absolute: 0 10*3/uL (ref 0.0–0.1)
EOS%: 1.7 % (ref 0.0–7.0)
Eosinophils Absolute: 0.1 10*3/uL (ref 0.0–0.5)
HCT: 34.7 % — ABNORMAL LOW (ref 34.8–46.6)
HEMOGLOBIN: 11.3 g/dL — AB (ref 11.6–15.9)
LYMPH#: 1.9 10*3/uL (ref 0.9–3.3)
LYMPH%: 25.8 % (ref 14.0–49.7)
MCH: 29 pg (ref 25.1–34.0)
MCHC: 32.7 g/dL (ref 31.5–36.0)
MCV: 88.8 fL (ref 79.5–101.0)
MONO#: 0.7 10*3/uL (ref 0.1–0.9)
MONO%: 8.8 % (ref 0.0–14.0)
NEUT%: 63.1 % (ref 38.4–76.8)
NEUTROS ABS: 4.7 10*3/uL (ref 1.5–6.5)
PLATELETS: 347 10*3/uL (ref 145–400)
RBC: 3.91 10*6/uL (ref 3.70–5.45)
RDW: 16.3 % — AB (ref 11.2–14.5)
WBC: 7.5 10*3/uL (ref 3.9–10.3)

## 2015-03-02 MED ORDER — CLINDAMYCIN PHOSPHATE 1 % EX SOLN: Freq: Two times a day (BID) | CUTANEOUS | Status: DC

## 2015-03-02 MED ORDER — SODIUM CHLORIDE 0.9 % IV SOLN
Freq: Once | INTRAVENOUS | Status: AC
  Administered 2015-03-02: 16:00:00 via INTRAVENOUS
  Filled 2015-03-02: qty 4

## 2015-03-02 MED ORDER — DIPHENHYDRAMINE HCL 50 MG/ML IJ SOLN
INTRAMUSCULAR | Status: AC
  Filled 2015-03-02: qty 1

## 2015-03-02 MED ORDER — HEPARIN SOD (PORK) LOCK FLUSH 100 UNIT/ML IV SOLN
500.0000 [IU] | Freq: Once | INTRAVENOUS | Status: AC | PRN
  Administered 2015-03-02: 500 [IU]
  Filled 2015-03-02: qty 5

## 2015-03-02 MED ORDER — FAMOTIDINE IN NACL 20-0.9 MG/50ML-% IV SOLN
INTRAVENOUS | Status: AC
  Filled 2015-03-02: qty 50

## 2015-03-02 MED ORDER — PACLITAXEL CHEMO INJECTION 300 MG/50ML
80.0000 mg/m2 | Freq: Once | INTRAVENOUS | Status: AC
  Administered 2015-03-02: 162 mg via INTRAVENOUS
  Filled 2015-03-02: qty 27

## 2015-03-02 MED ORDER — SODIUM CHLORIDE 0.9 % IV SOLN
250.0000 mL | Freq: Once | INTRAVENOUS | Status: AC
  Administered 2015-03-02: 250 mL via INTRAVENOUS

## 2015-03-02 MED ORDER — DIPHENHYDRAMINE HCL 50 MG/ML IJ SOLN
25.0000 mg | Freq: Once | INTRAMUSCULAR | Status: AC
  Administered 2015-03-02: 25 mg via INTRAVENOUS

## 2015-03-02 MED ORDER — MUPIROCIN CALCIUM 2 % EX CREA
1.0000 | TOPICAL_CREAM | Freq: Two times a day (BID) | CUTANEOUS | Status: DC
Start: 2015-03-02 — End: 2015-07-01

## 2015-03-02 MED ORDER — FAMOTIDINE IN NACL 20-0.9 MG/50ML-% IV SOLN
20.0000 mg | Freq: Once | INTRAVENOUS | Status: AC
  Administered 2015-03-02: 20 mg via INTRAVENOUS

## 2015-03-02 MED ORDER — OXYCODONE-ACETAMINOPHEN 7.5-325 MG PO TABS: 1.0000 | ORAL_TABLET | ORAL | Status: DC | PRN

## 2015-03-02 MED ORDER — SODIUM CHLORIDE 0.9 % IJ SOLN
10.0000 mL | INTRAMUSCULAR | Status: DC | PRN
  Administered 2015-03-02: 10 mL
  Filled 2015-03-02: qty 10

## 2015-03-02 NOTE — Progress Notes (Signed)
[place]  [phone] [phone]     ID: Laura Matthews DOB: Sep 20, 1966  MR#: [id number]  JYN#:[id number]  Patient Care Team: [name], MD as PCP - General (Family Medicine) Stark Klein, MD as Consulting Physician (General Surgery) Chauncey Cruel, MD as Consulting Physician (Oncology) Thea Silversmith, MD as Consulting Physician (Radiation Oncology) [facility] Bunnie Pion, NP as Nurse Practitioner (Nurse Practitioner)  CHIEF COMPLAINT: Stage III left breast cancer  CURRENT TREATMENT:adjuvant chemotherapy  BREAST CANCER HISTORY: From the original intake note:  The patient herself palpated a mass in her left breast and brought it to the attention of [facility] NP 04/22/2014. She palpated a large nontender mass at the 4:00 position as well as a small tender mass in the 6:00 position of the left breast. The patient was set up for imaging at the breast Center where on 05/05/2014 she underwent bilateral diagnostic mammography and left breast ultrasonography. The mammogram showed an irregular mass in the outer left breast with no other suspicious findings. This was firm and fixed at the 3:00 position approximately 10 cm from the nipple. Ultrasound showed a hypoechoic irregular solid mass in this area, measuring 1.8 cm. Ultrasound of the left axilla showed 2 suspicious level I lymph nodes, the largest measuring 1 cm, with a thickened pole. A smaller lymph node measured 0.6 cm but had no visible fatty hilum.  On 05/09/2014 the patient underwent biopsy of the left breast mass (she refused biopsy of the left axilla) which showed ([id number]) and invasive ductal carcinoma, grade 2, estrogen receptor positive at 100%, with strong staining intensity, progesterone receptor 80% positive, with weak staining intensity, with an MIB-1 of 80%, and no HER-2 amplification.  On 05/16/2014 the patient underwent bilateral breast MRI. This showed a 1.7 cm irregular enhancing  mass at the 3:00 position of the left breast associated with the biopsy cleft. There were 3 level I left axillary lymph nodes with thickened cortices. The largest measured 1.3 cm. There were no other findings of concern.  Her subsequent history is as detailed below.  INTERVAL HISTORY: Laura Matthews returns for follow up of her breast cancer. Today she is due for cycle 11 of paclitaxel. She missed her appointment with [place] last week, she states that she fell and scraped her arm and was not feeling well. She reports a rash to her left abdomen and left back that has been there about 2 weeks. She is applying hydrocortisone on the area this is not getting any better. She has had a good week this past week with no nausea or vomiting. Feels that her breast tenderness is improving. She would like a refill on her Percocet today.  REVIEW OF SYSTEMS:  Laura Matthews denies fevers, chills, or changes in bowel or bladder habits. She has no shortness of breath, chest pain or palpitations. She denies mouth sores, rashes, or peripheral neuropathy symptoms. She endorses anxiety and depression since her cancer diagnosis, and has been "on edge" ever since. She has persistent tenderness and mild swelling to her left breast, but this is improving.  A detailed review of systems was otherwise negative.   PAST MEDICAL HISTORY: Past Medical History  Diagnosis Date  . Mental disorder     depression-  . Arthritis   . Hot flashes   . Head ache   . Malignant neoplasm of breast (female), unspecified site   . Family history of malignant neoplasm of breast   . Depression     pt denies   .  Anemia     low iron  . Newly diagnosed diabetes 11/12/2014    PAST SURGICAL HISTORY: Past Surgical History  Procedure Laterality Date  . Knee surgery  1995    right  . Foot surgery  2011    rt  . Tubal ligation    . Uterine fibroid surgery  2005    ablation  . Breast lumpectomy with sentinel lymph node biopsy Left 06/11/2014    Procedure: BREAST  LUMPECTOMY WITH SENTINEL LYMPH NODE BX, POSSIBLE AXILLARY LYMPH NODE DISSECTION;  Surgeon: Stark Klein, MD;  Location: [place];  Service: General;  Laterality: Left;  . Portacath placement N/A 07/02/2014    Procedure: INSERTION PORT-A-CATH;  Surgeon: Stark Klein, MD;  Location: [place];  Service: General;  Laterality: N/A;  . Incision and drainage Left 07/02/2014    Procedure: Drainage left breast seroma;  Surgeon: Stark Klein, MD;  Location: [place];  Service: General;  Laterality: Left;  . Evacuation breast hematoma Left 07/10/2014    Procedure: EVACUATION OF LEFT BREAST HEMATOMA ;  Surgeon: Stark Klein, MD;  Location: [place];  Service: General;  Laterality: Left;  . Incision and drainage abscess N/A 11/12/2014    Procedure: ASPIRATION AND INCISION AND DRAINAGE LEFT BREAST ABSCESS;  Surgeon: Michael Boston, MD;  Location: [facility];  Service: General;  Laterality: N/A;    FAMILY HISTORY Family History  Problem Relation Age of Onset  . Breast cancer Mother 13    currently 58; TAH/BSO d/t ?cervical ca at 14  . Breast cancer Maternal Aunt     dx 33s; currently in her late 2s  . Lung cancer Father     deceased 35  . Diabetes Neg Hx    the patient's father died at the age of 72 from lung cancer. The patient's mother was diagnosed with breast cancer at the age of 26. She survives. The patient had 2 brothers, and 2 sisters. One brother may have a diagnosis of "stomach cancer"  GYNECOLOGIC HISTORY:  No LMP recorded. Patient has had an ablation. Menarche age 49 which is also when the patient first had a child. She is GX P3. She stopped having periods after laser ablation in 2005.  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 She has lived with her daughter Laura Matthews and her 4 children who are 32, 8, 5, and 4. 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 More recently the patient is living in a shelter. The  patient's son Laura Matthews is a fast food cook in [place] and the patient's daughter Laura Matthews works as a Scientist, water quality, also in [place]. The patient has 7 grandchildren. She is not a church attender    ADVANCED DIRECTIVES: Not in place; this was discussed with the patient on her first visit, 05/21/2014, and she indicated she plans to name her daughter Laura Matthews as her healthcare power of attorney   HEALTH MAINTENANCE: History  Substance Use Topics  . Smoking status: Current Every Day Smoker -- 0.25 packs/day    Types: Cigarettes  . Smokeless tobacco: Never Used  . Alcohol Use: No     Comment: Percoset,Vicodin, Oxycotin     Colonoscopy:  PAP:  Bone density:  Lipid panel:  Allergies  Allergen Reactions  . Hydrocodone Itching and Nausea Only    Current Outpatient Prescriptions  Medication Sig Dispense Refill  . ALPRAZolam (XANAX) 1 MG tablet   3  . Alum & Mag  Hydroxide-Simeth (MAGIC MOUTHWASH) SOLN Take 5 mLs by mouth 4 (four) times daily as needed for mouth pain.    Marland Kitchen bismuth subsalicylate (PEPTO BISMOL) 262 MG/15ML suspension Take 30 mLs by mouth every 6 (six) hours as needed for indigestion (indigestion).    . blood glucose meter kit and supplies Dispense based on patient and insurance preference. Use up to four times daily as directed. (FOR ICD-9 250.00, 250.01). 1 each 0  . dexamethasone (DECADRON) 4 MG tablet TAKE 2 TABLETS DAILY ON THE DAY AFTER CHEMO THEN 2 TABLETS 2 TIMES DAILY FOR 2 DAYS WITH FOOD 30 tablet 0  . glipiZIDE (GLUCOTROL) 5 MG tablet Take 1 tablet (5 mg total) by mouth 2 (two) times daily before a meal. 60 tablet 3  . ibuprofen (ADVIL,MOTRIN) 200 MG tablet Take 1 tablet (200 mg total) by mouth every 6 (six) hours as needed for moderate pain. 30 tablet 0  . Insulin Glargine (LANTUS) 100 UNIT/ML Solostar Pen Inject 15 Units into the skin daily at 10 pm. 15 mL 11  . lidocaine (LIDODERM) 5 % Place 1 patch onto the skin daily. Remove & Discard patch within 12 hours or as  directed by MD 30 patch 0  . metFORMIN (GLUCOPHAGE-XR) 500 MG 24 hr tablet Take 1 tablet (500 mg total) by mouth 2 (two) times daily with a meal. 60 tablet 3  . naproxen (EC NAPROSYN) 500 MG EC tablet Take 1 tablet (500 mg total) by mouth 2 (two) times daily with a meal. 30 tablet 0  . nicotine (NICODERM CQ) 21 mg/24hr patch Place 1 patch (21 mg total) onto the skin daily. 28 patch 1  . omeprazole (PRILOSEC) 40 MG capsule Take 1 capsule (40 mg total) by mouth at bedtime. 30 capsule 4  . ondansetron (ZOFRAN ODT) 8 MG disintegrating tablet Take 1 tablet (8 mg total) by mouth every 8 (eight) hours as needed for nausea or vomiting. 20 tablet 0  . oxyCODONE-acetaminophen (PERCOCET) 7.5-325 MG per tablet Take 1 tablet by mouth every 4 (four) hours as needed. 40 tablet 0  . prochlorperazine (COMPAZINE) 10 MG tablet Take 1 tablet (10 mg total) by mouth every 6 (six) hours as needed for nausea or vomiting. 30 tablet 1  . ranitidine (ZANTAC) 150 MG tablet Take 1 tablet (150 mg total) by mouth 2 (two) times daily. 60 tablet 0  . clindamycin (CLEOCIN-T) 1 % external solution Apply topically 2 (two) times daily. 30 mL 0  . lidocaine-prilocaine (EMLA) cream Apply 1 application topically as needed. 30 g 0  . mupirocin cream (BACTROBAN) 2 % Apply 1 application topically 2 (two) times daily. 15 g 0   No current facility-administered medications for this visit.   Facility-Administered Medications Ordered in Other Visits  Medication Dose Route Frequency Provider Last Rate Last Dose  . 0.9 %  sodium chloride infusion  250 mL Intravenous Once Chauncey Cruel, MD      . heparin lock flush 100 unit/mL  500 Units Intracatheter Once PRN Chauncey Cruel, MD      . PACLitaxel (TAXOL) 162 mg in dextrose 5 % 250 mL chemo infusion (</= 78m/m2)  80 mg/m2 (Treatment Plan Actual) Intravenous Once GChauncey Cruel MD 277 mL/hr at 03/02/15 1607 162 mg at 03/02/15 1607  . sodium chloride 0.9 % injection 10 mL  10 mL  Intracatheter PRN GChauncey Cruel MD   10 mL at 07/29/14 1704  . sodium chloride 0.9 % injection 10 mL  10 mL Intracatheter  PRN Chauncey Cruel, MD        OBJECTIVE: Middle-aged [place] American woman who appears stated age 49 Vitals:   03/02/15 1337  BP: 142/81  Pulse: 85  Temp: 98.1 F (36.7 C)  Resp: 18     Body mass index is 26.42 kg/(m^2).    ECOG FS:1 - Symptomatic but completely ambulatory  Sclerae anicteric, pupils equal and reactive Oropharynx clear and moist-- no thrush No cervical or supraclavicular adenopathy Lungs no rales or rhonchi Heart regular rate and rhythm Abd soft, nontender, positive bowel sounds MSK no focal spinal tenderness, no upper extremity lymphedema Neuro: nonfocal, well oriented, appropriate affect Breasts: deferred Skin: She has raised pustules or to her left abdomen and left back. The drainage is noted. These lesions are consistent with folliculitis.  LAB RESULTS:  CMP     Component Value Date/Time   NA 141 03/02/2015 1316   NA 136 11/13/2014 1010   K 4.1 03/02/2015 1316   K 4.2 11/13/2014 1010   CL 101 11/13/2014 1010   CO2 27 03/02/2015 1316   CO2 29 11/13/2014 1010   GLUCOSE 91 03/02/2015 1316   GLUCOSE 191* 11/13/2014 1010   BUN 10.4 03/02/2015 1316   BUN 12 11/13/2014 1010   CREATININE 0.6 03/02/2015 1316   CREATININE 0.46* 11/13/2014 1010   CALCIUM 9.4 03/02/2015 1316   CALCIUM 8.5 11/13/2014 1010   PROT 7.3 03/02/2015 1316   PROT 6.5 09/17/2014 0700   ALBUMIN 4.1 03/02/2015 1316   ALBUMIN 3.2* 09/17/2014 0700   AST 18 03/02/2015 1316   AST 11 09/17/2014 0700   ALT 25 03/02/2015 1316   ALT 20 09/17/2014 0700   ALKPHOS 68 03/02/2015 1316   ALKPHOS 131* 09/17/2014 0700   BILITOT 0.26 03/02/2015 1316   BILITOT <0.2* 09/17/2014 0700   GFRNONAA >90 11/13/2014 1010   GFRAA >90 11/13/2014 1010    I No results found for: SPEP  Lab Results  Component Value Date   WBC 7.5 03/02/2015   NEUTROABS 4.7 03/02/2015    HGB 11.3* 03/02/2015   HCT 34.7* 03/02/2015   MCV 88.8 03/02/2015   PLT 347 03/02/2015      Chemistry      Component Value Date/Time   NA 141 03/02/2015 1316   NA 136 11/13/2014 1010   K 4.1 03/02/2015 1316   K 4.2 11/13/2014 1010   CL 101 11/13/2014 1010   CO2 27 03/02/2015 1316   CO2 29 11/13/2014 1010   BUN 10.4 03/02/2015 1316   BUN 12 11/13/2014 1010   CREATININE 0.6 03/02/2015 1316   CREATININE 0.46* 11/13/2014 1010      Component Value Date/Time   CALCIUM 9.4 03/02/2015 1316   CALCIUM 8.5 11/13/2014 1010   ALKPHOS 68 03/02/2015 1316   ALKPHOS 131* 09/17/2014 0700   AST 18 03/02/2015 1316   AST 11 09/17/2014 0700   ALT 25 03/02/2015 1316   ALT 20 09/17/2014 0700   BILITOT 0.26 03/02/2015 1316   BILITOT <0.2* 09/17/2014 0700       No results found for: LABCA2  No components found for: LABCA125  No results for input(s): INR in the last 168 hours.  Urinalysis    Component Value Date/Time   COLORURINE YELLOW 06/03/2014 1050   APPEARANCEUR CLEAR 06/03/2014 1050   LABSPEC >1.030* 06/03/2014 1050   PHURINE 6.0 06/03/2014 1050   GLUCOSEU NEGATIVE 06/03/2014 1050   HGBUR SMALL* 06/03/2014 1050   BILIRUBINUR SMALL* 06/03/2014 [place] 06/03/2014  [place] 06/03/2014 1050   UROBILINOGEN 0.2 06/03/2014 1050   NITRITE NEGATIVE 06/03/2014 1050   LEUKOCYTESUR TRACE* 06/03/2014 1050    STUDIES: No results found.  ASSESSMENT: 49 y.o. [place] woman status post left breast upper outer quadrant biopsy 05/09/2014 for a clinicalT1c N1, stage IIA invasive ductal carcinoma, grade 2, estrogen and progesterone receptor positive, HER-2 nonamplified, with an MIB-1 of 80%  (1) patient met with genetics counselor 05/29/2014 but decided against genetics testing   (2) tobacco abuse. The patient has been strongly urged to discontinue smoking. She was prescribed a nicotine patch 09/02/2014  (3) status post left lumpectomy and axillary lymph  node dissection 06/11/2014 for a pT1c pN3, stage IIIC invasive ductal carcinoma, grade 3, with repeat HER-2 negative  (4) adjuvant chemotherapy to consist of doxorubicin and cyclophosphamide in dose dense fashion x4, with Neulasta support, started 07/29/2014, to be followed by paclitaxel weekly x12  (5) adjuvant radiation to follow chemotherapy  (6) anti-estrogens for 5-10 years to follow radiation  PLAN: Khristina is feeling well today labs were reviewed in detail and were entirely stable. She will proceed with cycle 11 of paclitaxel as planned today. I have refilled her Percocet for her today. For her rash I have prescribed Bactroban cream to apply twice a day and clindamycin cream to be applied twice a day to the area.  Camiyah will return in 1 week for labs and cycle 12 of treatment. She understands and agrees with this plan. She knows the goal of treatment in her case is cure. She has been encouraged to call with any issues that might arise before her next visit here.   Mikey Bussing, NP 03/02/2015

## 2015-03-07 ENCOUNTER — Emergency Department (HOSPITAL_COMMUNITY)
Admission: EM | Admit: 2015-03-07 | Discharge: 2015-03-07 | Disposition: A | Discharge status: Home / Self Care | Payer: Medicaid Other | Attending: Emergency Medicine | Admitting: Emergency Medicine

## 2015-03-07 ENCOUNTER — Encounter (HOSPITAL_COMMUNITY): Payer: Self-pay | Admitting: Emergency Medicine

## 2015-03-07 DIAGNOSIS — Z8742 Personal history of other diseases of the female genital tract: Secondary | ICD-10-CM | POA: Diagnosis not present

## 2015-03-07 DIAGNOSIS — Y998 Other external cause status: Secondary | ICD-10-CM | POA: Insufficient documentation

## 2015-03-07 DIAGNOSIS — Z794 Long term (current) use of insulin: Secondary | ICD-10-CM | POA: Diagnosis not present

## 2015-03-07 DIAGNOSIS — E119 Type 2 diabetes mellitus without complications: Secondary | ICD-10-CM | POA: Insufficient documentation

## 2015-03-07 DIAGNOSIS — Z8659 Personal history of other mental and behavioral disorders: Secondary | ICD-10-CM | POA: Insufficient documentation

## 2015-03-07 DIAGNOSIS — Y9389 Activity, other specified: Secondary | ICD-10-CM | POA: Diagnosis not present

## 2015-03-07 DIAGNOSIS — S4991XA Unspecified injury of right shoulder and upper arm, initial encounter: Secondary | ICD-10-CM | POA: Diagnosis not present

## 2015-03-07 DIAGNOSIS — Z853 Personal history of malignant neoplasm of breast: Secondary | ICD-10-CM | POA: Insufficient documentation

## 2015-03-07 DIAGNOSIS — Z8739 Personal history of other diseases of the musculoskeletal system and connective tissue: Secondary | ICD-10-CM | POA: Insufficient documentation

## 2015-03-07 DIAGNOSIS — Y92410 Unspecified street and highway as the place of occurrence of the external cause: Secondary | ICD-10-CM | POA: Insufficient documentation

## 2015-03-07 DIAGNOSIS — Z720 Tobacco use: Secondary | ICD-10-CM | POA: Insufficient documentation

## 2015-03-07 DIAGNOSIS — Z79899 Other long term (current) drug therapy: Secondary | ICD-10-CM | POA: Insufficient documentation

## 2015-03-07 DIAGNOSIS — S4992XA Unspecified injury of left shoulder and upper arm, initial encounter: Secondary | ICD-10-CM | POA: Insufficient documentation

## 2015-03-07 DIAGNOSIS — S199XXA Unspecified injury of neck, initial encounter: Secondary | ICD-10-CM | POA: Diagnosis present

## 2015-03-07 DIAGNOSIS — Z791 Long term (current) use of non-steroidal anti-inflammatories (NSAID): Secondary | ICD-10-CM | POA: Diagnosis not present

## 2015-03-07 DIAGNOSIS — Z862 Personal history of diseases of the blood and blood-forming organs and certain disorders involving the immune mechanism: Secondary | ICD-10-CM | POA: Insufficient documentation

## 2015-03-07 DIAGNOSIS — S24109A Unspecified injury at unspecified level of thoracic spinal cord, initial encounter: Secondary | ICD-10-CM | POA: Insufficient documentation

## 2015-03-07 MED ORDER — OXYCODONE-ACETAMINOPHEN 5-325 MG PO TABS: 2.0000 | ORAL_TABLET | Freq: Four times a day (QID) | ORAL | Status: DC | PRN
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
## 2015-03-09 ENCOUNTER — Encounter: Payer: Self-pay | Admitting: Nurse Practitioner

## 2015-03-09 ENCOUNTER — Ambulatory Visit (HOSPITAL_BASED_OUTPATIENT_CLINIC_OR_DEPARTMENT_OTHER): Payer: Medicaid Other

## 2015-03-09 ENCOUNTER — Ambulatory Visit (HOSPITAL_BASED_OUTPATIENT_CLINIC_OR_DEPARTMENT_OTHER): Payer: Medicaid Other | Admitting: Nurse Practitioner

## 2015-03-09 ENCOUNTER — Other Ambulatory Visit (HOSPITAL_BASED_OUTPATIENT_CLINIC_OR_DEPARTMENT_OTHER): Payer: Medicaid Other

## 2015-03-09 VITALS — BP 146/83 | HR 95 | Temp 98.0°F | Resp 18 | Ht 70.0 in | Wt 183.4 lb

## 2015-03-09 DIAGNOSIS — C773 Secondary and unspecified malignant neoplasm of axilla and upper limb lymph nodes: Secondary | ICD-10-CM | POA: Diagnosis not present

## 2015-03-09 DIAGNOSIS — C50412 Malignant neoplasm of upper-outer quadrant of left female breast: Secondary | ICD-10-CM

## 2015-03-09 DIAGNOSIS — Z5111 Encounter for antineoplastic chemotherapy: Secondary | ICD-10-CM | POA: Diagnosis present

## 2015-03-09 DIAGNOSIS — C50912 Malignant neoplasm of unspecified site of left female breast: Secondary | ICD-10-CM

## 2015-03-09 DIAGNOSIS — Z170 Estrogen receptor positive status [ER+]: Secondary | ICD-10-CM | POA: Diagnosis not present

## 2015-03-09 DIAGNOSIS — C50911 Malignant neoplasm of unspecified site of right female breast: Secondary | ICD-10-CM

## 2015-03-09 DIAGNOSIS — Z720 Tobacco use: Secondary | ICD-10-CM | POA: Diagnosis not present

## 2015-03-09 LAB — CBC WITH DIFFERENTIAL/PLATELET
BASO%: 0.6 % (ref 0.0–2.0)
Basophils Absolute: 0.1 10*3/uL (ref 0.0–0.1)
EOS ABS: 0.2 10*3/uL (ref 0.0–0.5)
EOS%: 2.6 % (ref 0.0–7.0)
HCT: 33.7 % — ABNORMAL LOW (ref 34.8–46.6)
HGB: 11.5 g/dL — ABNORMAL LOW (ref 11.6–15.9)
LYMPH#: 2.3 10*3/uL (ref 0.9–3.3)
LYMPH%: 28.2 % (ref 14.0–49.7)
MCH: 30.5 pg (ref 25.1–34.0)
MCHC: 34.1 g/dL (ref 31.5–36.0)
MCV: 89.4 fL (ref 79.5–101.0)
MONO#: 0.6 10*3/uL (ref 0.1–0.9)
MONO%: 7 % (ref 0.0–14.0)
NEUT#: 5 10*3/uL (ref 1.5–6.5)
NEUT%: 61.6 % (ref 38.4–76.8)
PLATELETS: 334 10*3/uL (ref 145–400)
RBC: 3.77 10*6/uL (ref 3.70–5.45)
RDW: 15.5 % — ABNORMAL HIGH (ref 11.2–14.5)
WBC: 8.1 10*3/uL (ref 3.9–10.3)

## 2015-03-09 MED ORDER — SODIUM CHLORIDE 0.9 % IV SOLN
Freq: Once | INTRAVENOUS | Status: AC
  Administered 2015-03-09: 16:00:00 via INTRAVENOUS

## 2015-03-09 MED ORDER — FAMOTIDINE IN NACL 20-0.9 MG/50ML-% IV SOLN
INTRAVENOUS | Status: AC
  Filled 2015-03-09: qty 50

## 2015-03-09 MED ORDER — DIPHENHYDRAMINE HCL 50 MG/ML IJ SOLN
25.0000 mg | Freq: Once | INTRAMUSCULAR | Status: AC
  Administered 2015-03-09: 25 mg via INTRAVENOUS

## 2015-03-09 MED ORDER — DIPHENHYDRAMINE HCL 50 MG/ML IJ SOLN
INTRAMUSCULAR | Status: AC
  Filled 2015-03-09: qty 1

## 2015-03-09 MED ORDER — PACLITAXEL CHEMO INJECTION 300 MG/50ML
80.0000 mg/m2 | Freq: Once | INTRAVENOUS | Status: AC
  Administered 2015-03-09: 162 mg via INTRAVENOUS
  Filled 2015-03-09: qty 27

## 2015-03-09 MED ORDER — SODIUM CHLORIDE 0.9 % IJ SOLN
10.0000 mL | INTRAMUSCULAR | Status: DC | PRN
  Administered 2015-03-09: 10 mL
  Filled 2015-03-09: qty 10

## 2015-03-09 MED ORDER — HEPARIN SOD (PORK) LOCK FLUSH 100 UNIT/ML IV SOLN
500.0000 [IU] | Freq: Once | INTRAVENOUS | Status: AC | PRN
  Administered 2015-03-09: 500 [IU]
  Filled 2015-03-09: qty 5

## 2015-03-09 MED ORDER — FAMOTIDINE IN NACL 20-0.9 MG/50ML-% IV SOLN
20.0000 mg | Freq: Once | INTRAVENOUS | Status: AC
  Administered 2015-03-09: 20 mg via INTRAVENOUS

## 2015-03-09 MED ORDER — SODIUM CHLORIDE 0.9 % IV SOLN
Freq: Once | INTRAVENOUS | Status: AC
  Administered 2015-03-09: 16:00:00 via INTRAVENOUS
  Filled 2015-03-09: qty 4
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
## 2015-03-09 NOTE — Patient Instructions (Signed)
[facility] Discharge Instructions for Patients Receiving Chemotherapy  Today you received the following chemotherapy agents Taxol.  To help prevent nausea and vomiting after your treatment, we encourage you to take your nausea medication as directed.    If you develop nausea and vomiting that is not controlled by your nausea medication, call the clinic.   BELOW ARE SYMPTOMS THAT SHOULD BE REPORTED IMMEDIATELY:  *FEVER GREATER THAN 100.5 F  *CHILLS WITH OR WITHOUT FEVER  NAUSEA AND VOMITING THAT IS NOT CONTROLLED WITH YOUR NAUSEA MEDICATION  *UNUSUAL SHORTNESS OF BREATH  *UNUSUAL BRUISING OR BLEEDING  TENDERNESS IN MOUTH AND THROAT WITH OR WITHOUT PRESENCE OF ULCERS  *URINARY PROBLEMS  *BOWEL PROBLEMS  UNUSUAL RASH Items with * indicate a potential emergency and should be followed up as soon as possible.  Feel free to call the clinic you have any questions or concerns. The clinic phone number is [phone].    

## 2015-03-10 ENCOUNTER — Other Ambulatory Visit: Payer: Self-pay | Admitting: General Surgery

## 2015-03-16 NOTE — Progress Notes (Signed)
Location of Breast Cancer:Left Left Breast.Grade 2 invasive ductal carcinoma  Histology per Pathology Report:06/11/14 Diagnosis 1. Lymph node, sentinel, biopsy, Left axillary #1 - METASTATIC CARCINOMA IN ONE LYMPH NODE (1/1). 2. Lymph node, sentinel, biopsy, Left axillary #2 - ONE BENIGN LYMPH NODE (0/1). 1 of 4 FINAL for SEHER, SCHLAGEL ([id number]) Diagnosis(continued) 3. Lymph node, sentinel, biopsy, Left axillary #3 - METASTATIC CARCINOMA IN ONE LYMPH NODE (1/1). 4. Lymph node, sentinel, biopsy, Left axillary #4 - MICROMETASTATIC CARCINOMA IN ONE LYMPH NODE (1/1). 5. Lymph node, sentinel, biopsy, Left axillary #5 - METASTATIC CARCINOMA IN ONE LYMPH NODE (1/1). 6. Breast, partial mastectomy, Left - INVASIVE DUCTAL CARCINOMA, 1.8 CM. - DUCTAL CARCINOMA IN SITU. - CLOSEST MARGIN INFERIOR AT LESS THAN 0.1 CM. - LYMPHATIC VASCULAR INVASION PRESENT. 7. Lymph nodes, regional resection, Axillary left content - METASTATIC CARCINOMA IN EIGHT OF TWENTY-TWO LYMPH NODES (8/22). Microscopic Comment  Receptor Status: ER(+), PR (+), Her2-neu (-)  Did patient present with symptoms (if so, please note symptoms) or was this found on screening mammography?:Patient palpated mass May 2015.Presented to emergency department for breast pain and found to have mass on mammogram and ultrasound.  Past/Anticipated interventions by surgeon, if any:06/11/14:Panel 1: Left BREAST LUMPECTOMY WITH SENTINEL LYMPH NODE BX, POSSIBLE AXILLARY LYMPH NODE DISSECTION with Stark Klein, MD Aspiration of left hematoma 11/12/2014  Past/Anticipated interventions by medical oncology, if any: Chemotherapy completed on 03/09/15.  Lymphedema issues, if any: Breast edema  Pain issues, if any: No   SAFETY ISSUES:  Prior radiation? No  Pacemaker/ICD? No  Possible current pregnancy?No.Had ablation in 2005.  Is the patient on methotrexate?No  Current Complaints / other details:Single.Menarche age.Three children.First live  birth age 27.LMP in 2005.No HRT.  Allergies:Norco History of depression  Former smoker 1 ppd.No alcohol     Arlyss Repress, RN 03/16/2015,2:58 PM  BP 120/68 mmHg  Pulse 99  Temp(Src) 98.8 F (37.1 C)  Wt 186 lb 3.2 oz (84.46 kg)

## 2015-03-18 NOTE — Progress Notes (Signed)
   Department of Radiation Oncology  Phone:  [phone] Fax:        [phone]   Name: Laura Matthews MRN: [id number]  DOB: 03-25-1966  Date: 03/19/2015  Follow Up Visit Note  Diagnosis: Breast cancer of upper-outer quadrant of left female breast   Staging form: Breast, AJCC 7th Edition     Clinical: Stage IA (T1c, N0, cM0) - Unsigned       Staging comments: Staged at breast conference 7.22.15      Pathologic: Stage IIIC (T1c, N3, cM0) - Unsigned  Interval History: Laura Matthews presents today for routine followup. Since I last met the patient, she has undergone a left lumpectomy, sentinal node biopsy, and axillary node dissection on 06/11/14. She was found to have a 1.8cm invasive ductal carcinoma with lymphovascular invasion and 12/27 positive lymph nodes. This was ER/PR positive and HER2 negative. She completed adjuvant chemotherapy and is ready to begin begin radiation. Her last dose of chemotherapy was on 03/09/15.  The pt states that she feels like she needs a break, but will want to start radiation witin the next month. The pt is worried about the possibility of fatigue during radiation. The pt states that she already has lymphedema and has been seen by physical therapy  Physical Exam:  Filed Vitals:   03/19/15 1001  BP: 120/68  Pulse: 99  Temp: 98.8 F (37.1 C)  Weight: 186 lb 3.2 oz (84.46 kg)   Pleasant female in no distress.   IMPRESSION: Laura Matthews is a 49 y.o. female T1cN3 invasive ductal carcinoma of the left breast  PLAN: I spoke to the patient today regarding her diagnosis and options for treatment. We discussed the equivalence in terms of survival and local failure between mastectomy and breast conservation. We discussed the role of radiation in decreasing local failures in patients who undergo lumpectomy. We discussed the process of simulation and the placement tattoos. We discussed 4-6 weeks of treatment as an outpatient. We discussed the possibility of asymptomatic lung  damage. We discussed the low likelihood of secondary malignancies. We discussed the possible side effects including but not limited to skin redness, fatigue, permanent skin darkening, lymphedema and breast swelling.  Because she has great than 25% of positive lymph nodes, I will treat her supraclavicuar fossa as well as her axilla. We discussed this may increase her risk of lymphedema. She was seen by physical therapy and has reached her limit of visits due to Medicaid. She signed a consent form and this was placed in the patient's chart. CT sim will be scheduled on June 7 at 1PM with radiation treatment to start in mid-June.  This document serves as a record of services personally performed by Thea Silversmith, MD. It was created on her behalf by Darcus Austin, a trained medical scribe. The creation of this record is based on the scribe's personal observations and the provider's statements to them. This document has been checked and approved by the attending provider.   Thea Silversmith, MD

## 2015-03-19 ENCOUNTER — Ambulatory Visit
Admission: RE | Admit: 2015-03-19 | Discharge: 2015-03-19 | Disposition: A | Discharge status: Home / Self Care | Payer: Medicaid Other | Source: Ambulatory Visit | Attending: Radiation Oncology | Admitting: Radiation Oncology

## 2015-03-19 VITALS — BP 120/68 | HR 99 | Temp 98.8°F | Wt 186.2 lb

## 2015-03-19 DIAGNOSIS — C50412 Malignant neoplasm of upper-outer quadrant of left female breast: Secondary | ICD-10-CM

## 2015-03-19 DIAGNOSIS — C50912 Malignant neoplasm of unspecified site of left female breast: Secondary | ICD-10-CM | POA: Diagnosis present

## 2015-03-19 DIAGNOSIS — Z510 Encounter for antineoplastic radiation therapy: Secondary | ICD-10-CM | POA: Diagnosis present

## 2015-03-27 ENCOUNTER — Other Ambulatory Visit: Payer: Self-pay | Admitting: Oncology

## 2015-03-27 ENCOUNTER — Other Ambulatory Visit: Payer: Self-pay | Admitting: General Surgery

## 2015-03-27 DIAGNOSIS — C50912 Malignant neoplasm of unspecified site of left female breast: Secondary | ICD-10-CM

## 2015-04-07 ENCOUNTER — Ambulatory Visit
Admission: RE | Admit: 2015-04-07 | Discharge: 2015-04-07 | Disposition: A | Discharge status: Home / Self Care | Payer: Medicaid Other | Source: Ambulatory Visit | Attending: Radiation Oncology | Admitting: Radiation Oncology

## 2015-04-07 ENCOUNTER — Ambulatory Visit: Payer: Medicaid Other | Admitting: Radiation Oncology

## 2015-04-08 ENCOUNTER — Other Ambulatory Visit: Payer: Self-pay | Admitting: General Surgery

## 2015-04-09 ENCOUNTER — Ambulatory Visit: Payer: Medicaid Other | Admitting: Radiation Oncology

## 2015-04-09 ENCOUNTER — Encounter: Payer: Self-pay | Admitting: Oncology

## 2015-04-09 ENCOUNTER — Other Ambulatory Visit: Payer: Self-pay

## 2015-04-10 ENCOUNTER — Telehealth: Payer: Self-pay | Admitting: Oncology

## 2015-04-10 NOTE — Telephone Encounter (Signed)
Confirmed appointment for August.

## 2015-04-14 ENCOUNTER — Ambulatory Visit
Admission: RE | Admit: 2015-04-14 | Discharge: 2015-04-14 | Disposition: A | Discharge status: Home / Self Care | Payer: Medicaid Other | Source: Ambulatory Visit | Attending: Radiation Oncology | Admitting: Radiation Oncology

## 2015-04-14 DIAGNOSIS — Z510 Encounter for antineoplastic radiation therapy: Secondary | ICD-10-CM | POA: Diagnosis not present

## 2015-04-14 DIAGNOSIS — C50412 Malignant neoplasm of upper-outer quadrant of left female breast: Secondary | ICD-10-CM

## 2015-04-14 NOTE — Progress Notes (Signed)
Name: Laura Matthews   MRN: [id number]  Date:  04/14/2015  DOB: 01-21-1966  Status:outpatient   DIAGNOSIS: Left Breast cancer.  CONSENT VERIFIED: yes SET UP: Patient is setup supine  IMMOBILIZATION:  The following immobilization was used:Custom Moldable Pillow, breast board.  NARRATIVE: [name] was brought to the [facility].  Identity was confirmed.  All relevant records and images related to the planned course of therapy were reviewed.  Then, the patient was positioned in a stable reproducible clinical set-up for radiation therapy.  Wires were placed to delineate the clinical extent of breast tissue. A wire was placed on the scar as well.  CT images were obtained.  An isocenter was placed. Skin markings were placed.  The position of the heart was then analyzed.  Due to the proximity of the heart to the chest wall, I felt she would benefit from deep inspiration breath hold for cardiac sparing.  She was then coached and rescanned in the breath hold position.  Acceptable cardiac sparing was achieved. The CT images were loaded into the planning software where the target and avoidance structures were contoured.  The radiation prescription was entered and confirmed. The patient was discharged in stable condition and tolerated simulation well.    TREATMENT PLANNING NOTE/3D Simulation Note Treatment planning then occurred. I have requested : MLC's, isodose plan, basic dose calculation  3D simulation was performed.  I personally supervised and oversaw the construction of 5 medically necessary complex treatment devices in the form of MLCs which will be used for beam modification purposes and to protect critical structures including the heart and lung as well as the immobilization devices which will be used to ensure reproducible set up.  I have requested a dose volume histogram of the heart lung and tumor cavity.    RESPIRATORY MOTION MANAGEMENT SIMULATION - Deep Inspiration Breath  Hold  NARRATIVE:  In order to account for effect of respiratory motion on target structures and other organs in the planning and delivery of radiotherapy, this patient underwent respiratory motion management simulation.  To accomplish this, when the patient was brought to the CT simulation planning suite, a bellows was placed on the her abdomen.  Wave forms of the patient's breathing were obtained. Coaching was performed and practice sessions initiated to monitor her ability to obtain and maintain deep inspiration breath hold.  The CT images were loaded into the planning software and fused with her free breathing images by physics.  Acceptable cardiac sparing was achieved through the use of deep inspiration breath hold.  Planning will be performed on her breath hold scan

## 2015-04-17 DIAGNOSIS — Z510 Encounter for antineoplastic radiation therapy: Secondary | ICD-10-CM | POA: Diagnosis not present

## 2015-04-20 DIAGNOSIS — Z510 Encounter for antineoplastic radiation therapy: Secondary | ICD-10-CM | POA: Diagnosis not present

## 2015-04-21 ENCOUNTER — Encounter: Payer: Self-pay | Admitting: Radiation Oncology

## 2015-04-21 ENCOUNTER — Ambulatory Visit
Admission: RE | Admit: 2015-04-21 | Discharge: 2015-04-21 | Disposition: A | Discharge status: Home / Self Care | Payer: Medicaid Other | Source: Ambulatory Visit | Attending: Radiation Oncology | Admitting: Radiation Oncology

## 2015-04-21 ENCOUNTER — Other Ambulatory Visit: Payer: Self-pay | Admitting: Nurse Practitioner

## 2015-04-21 DIAGNOSIS — Z510 Encounter for antineoplastic radiation therapy: Secondary | ICD-10-CM | POA: Diagnosis not present

## 2015-04-21 NOTE — Progress Notes (Signed)
Patient was given a 31 day bus pass on 04/20/2015 as well as a gas card from the [place] transportation grant on 04/21/2015

## 2015-04-22 ENCOUNTER — Ambulatory Visit
Admission: RE | Admit: 2015-04-22 | Discharge: 2015-04-22 | Disposition: A | Discharge status: Home / Self Care | Payer: Medicaid Other | Source: Ambulatory Visit | Attending: Radiation Oncology | Admitting: Radiation Oncology

## 2015-04-22 DIAGNOSIS — Z510 Encounter for antineoplastic radiation therapy: Secondary | ICD-10-CM | POA: Diagnosis not present

## 2015-04-23 ENCOUNTER — Ambulatory Visit
Admission: RE | Admit: 2015-04-23 | Discharge: 2015-04-23 | Disposition: A | Discharge status: Home / Self Care | Payer: Medicaid Other | Source: Ambulatory Visit | Attending: Radiation Oncology | Admitting: Radiation Oncology

## 2015-04-23 DIAGNOSIS — Z510 Encounter for antineoplastic radiation therapy: Secondary | ICD-10-CM | POA: Diagnosis not present

## 2015-04-24 ENCOUNTER — Ambulatory Visit
Admission: RE | Admit: 2015-04-24 | Discharge: 2015-04-24 | Disposition: A | Discharge status: Home / Self Care | Payer: Medicaid Other | Source: Ambulatory Visit | Attending: Radiation Oncology | Admitting: Radiation Oncology

## 2015-04-24 ENCOUNTER — Ambulatory Visit
Admission: RE | Admit: 2015-04-24 | Discharge: 2015-04-24 | Disposition: A | Discharge status: Home / Self Care | Payer: No Typology Code available for payment source | Source: Ambulatory Visit | Attending: Radiation Oncology | Admitting: Radiation Oncology

## 2015-04-24 DIAGNOSIS — C50412 Malignant neoplasm of upper-outer quadrant of left female breast: Secondary | ICD-10-CM

## 2015-04-24 DIAGNOSIS — Z510 Encounter for antineoplastic radiation therapy: Secondary | ICD-10-CM | POA: Diagnosis not present

## 2015-04-24 MED ORDER — RADIAPLEXRX EX GEL
Freq: Once | CUTANEOUS | Status: AC
  Administered 2015-04-24: 11:00:00 via TOPICAL

## 2015-04-24 MED ORDER — ALRA NON-METALLIC DEODORANT (RAD-ONC)
1.0000 "application " | Freq: Once | TOPICAL | Status: AC
  Administered 2015-04-24: 1 via TOPICAL

## 2015-04-24 NOTE — Progress Notes (Signed)
Pt here for patient teaching.  Pt given Radiation and You booklet, skin care instructions, Alra deodorant and Radiaplex gel. Reviewed areas of pertinence such as  . Pt able to give teach back of to pat skin, use unscented/gentle soap and drink plenty of water,apply Radiaplex bid, avoid applying anything to skin within 4 hours of treatment, avoid wearing an under wire bra and to use an electric razor if they must shave. Pt needs reinforcement and verbalizes understanding of information given and will contact nursing with any questions or concerns.

## 2015-04-27 ENCOUNTER — Ambulatory Visit
Admission: RE | Admit: 2015-04-27 | Discharge: 2015-04-27 | Disposition: A | Discharge status: Home / Self Care | Payer: Medicaid Other | Source: Ambulatory Visit | Attending: Radiation Oncology | Admitting: Radiation Oncology

## 2015-04-27 DIAGNOSIS — Z510 Encounter for antineoplastic radiation therapy: Secondary | ICD-10-CM | POA: Diagnosis not present

## 2015-04-28 ENCOUNTER — Ambulatory Visit
Admission: RE | Admit: 2015-04-28 | Discharge: 2015-04-28 | Disposition: A | Discharge status: Home / Self Care | Payer: Medicaid Other | Source: Ambulatory Visit | Attending: Radiation Oncology | Admitting: Radiation Oncology

## 2015-04-28 VITALS — BP 129/78 | HR 77 | Temp 98.8°F | Wt 180.7 lb

## 2015-04-28 DIAGNOSIS — C50412 Malignant neoplasm of upper-outer quadrant of left female breast: Secondary | ICD-10-CM | POA: Insufficient documentation

## 2015-04-28 DIAGNOSIS — Z510 Encounter for antineoplastic radiation therapy: Secondary | ICD-10-CM | POA: Diagnosis not present

## 2015-04-28 LAB — GLUCOSE, CAPILLARY: Glucose-Capillary: 122 mg/dL — ABNORMAL HIGH (ref 65–99)

## 2015-04-28 NOTE — Progress Notes (Signed)
  Radiation Oncology         [phone]   Name: Laura Matthews MRN: [id number]   Date: 04/28/2015  DOB: [birth year]   Weekly Radiation Therapy Management    ICD-9-CM ICD-10-CM   1. Breast cancer of upper-outer quadrant of left female breast 174.4 C50.412     Current Dose: 9 Gy  Planned Dose:  45 Gy + boost  Narrative The patient presents for routine under treatment assessment. Skin is fine. Patient very emotional and tearful. Cancer Care form completed and signed. Doesn't wish to speak with manager or social worker now. Patient rides bus daily for treatment and has been given cards for this. Stopped all of diabetic medications.  She has experienced a lot of stress coming to treatment daily. The patient is without complaint. Set-up films were reviewed. The chart was checked.  Physical Findings  weight is 180 lb 11.2 oz (81.965 kg). Her temperature is 98.8 F (37.1 C). Her blood pressure is 129/78 and her pulse is 77. . Weight essentially stable.  No significant changes.  Impression The patient is tolerating radiation.  Plan Continue treatment as planned. Ordered a blood sugar check for today.  Glucose 122     This document serves as a record of services personally performed by Tyler Pita, MD. It was created on his behalf by Arlyce Harman, a trained medical scribe. The creation of this record is based on the scribe's personal observations and the provider's statements to them. This document has been checked and approved by the attending provider.       Sheral Apley Tammi Klippel, M.D.

## 2015-04-28 NOTE — Progress Notes (Signed)
Per Dr. Johny Shears order check patient's blood sugar. Proved to be 122. Informed Dr. Tammi Klippel. Encouraged patient to follow up with PCP reference elevated blood sugar.

## 2015-04-28 NOTE — Progress Notes (Signed)
Weekly assessment of radiation to left breast and supraclavicular region.Completed 5 of 33 treatments.skin is fine.Patient very emotional and tearful.Cancer Care form completed and signed.Doesn't wish to speak with manager or social worker now.Informed to request to see myself for any questions or needs.Patient rids bus daily for treatment and has been given cards for this.Stopped all of diabetic medications.Has agreed to have blood work on tomorrow prior to raidation if ordered by Dr.Manning.

## 2015-04-29 ENCOUNTER — Encounter (HOSPITAL_COMMUNITY): Payer: Self-pay | Admitting: *Deleted

## 2015-04-29 ENCOUNTER — Emergency Department (HOSPITAL_COMMUNITY)
Admission: EM | Admit: 2015-04-29 | Discharge: 2015-04-29 | Disposition: A | Discharge status: Home / Self Care | Payer: Medicaid Other | Attending: Emergency Medicine | Admitting: Emergency Medicine

## 2015-04-29 ENCOUNTER — Ambulatory Visit: Payer: Medicaid Other

## 2015-04-29 DIAGNOSIS — N644 Mastodynia: Secondary | ICD-10-CM | POA: Diagnosis not present

## 2015-04-29 DIAGNOSIS — M1990 Unspecified osteoarthritis, unspecified site: Secondary | ICD-10-CM | POA: Diagnosis not present

## 2015-04-29 DIAGNOSIS — Z79899 Other long term (current) drug therapy: Secondary | ICD-10-CM | POA: Insufficient documentation

## 2015-04-29 DIAGNOSIS — G629 Polyneuropathy, unspecified: Secondary | ICD-10-CM | POA: Insufficient documentation

## 2015-04-29 DIAGNOSIS — Z862 Personal history of diseases of the blood and blood-forming organs and certain disorders involving the immune mechanism: Secondary | ICD-10-CM | POA: Insufficient documentation

## 2015-04-29 DIAGNOSIS — Z9889 Other specified postprocedural states: Secondary | ICD-10-CM | POA: Insufficient documentation

## 2015-04-29 DIAGNOSIS — F329 Major depressive disorder, single episode, unspecified: Secondary | ICD-10-CM | POA: Insufficient documentation

## 2015-04-29 DIAGNOSIS — Z853 Personal history of malignant neoplasm of breast: Secondary | ICD-10-CM | POA: Insufficient documentation

## 2015-04-29 DIAGNOSIS — R531 Weakness: Secondary | ICD-10-CM | POA: Diagnosis not present

## 2015-04-29 DIAGNOSIS — E119 Type 2 diabetes mellitus without complications: Secondary | ICD-10-CM | POA: Insufficient documentation

## 2015-04-29 LAB — BASIC METABOLIC PANEL
ANION GAP: 11 (ref 5–15)
BUN: 12 mg/dL (ref 6–20)
CALCIUM: 9.4 mg/dL (ref 8.9–10.3)
CO2: 25 mmol/L (ref 22–32)
Chloride: 104 mmol/L (ref 101–111)
Creatinine, Ser: 0.48 mg/dL (ref 0.44–1.00)
GFR calc Af Amer: >60 mL/min (ref >60)
GLUCOSE: 138 mg/dL — AB (ref 65–99)
Potassium: 3.5 mmol/L (ref 3.5–5.1)
SODIUM: 140 mmol/L (ref 135–145)

## 2015-04-29 LAB — CBC WITH DIFFERENTIAL/PLATELET
BASOS PCT: 0 % (ref 0–1)
Basophils Absolute: 0 10*3/uL (ref 0.0–0.1)
Eosinophils Absolute: 0.2 10*3/uL (ref 0.0–0.7)
Eosinophils Relative: 2 % (ref 0–5)
HEMATOCRIT: 36.3 % (ref 36.0–46.0)
HEMOGLOBIN: 12 g/dL (ref 12.0–15.0)
LYMPHS ABS: 1.6 10*3/uL (ref 0.7–4.0)
LYMPHS PCT: 19 % (ref 12–46)
MCH: 29.1 pg (ref 26.0–34.0)
MCHC: 33.1 g/dL (ref 30.0–36.0)
MCV: 88.1 fL (ref 78.0–100.0)
MONO ABS: 0.4 10*3/uL (ref 0.1–1.0)
Monocytes Relative: 5 % (ref 3–12)
NEUTROS ABS: 5.9 10*3/uL (ref 1.7–7.7)
NEUTROS PCT: 74 % (ref 43–77)
Platelets: 316 10*3/uL (ref 150–400)
RBC: 4.12 MIL/uL (ref 3.87–5.11)
RDW: 14.8 % (ref 11.5–15.5)
WBC: 8 10*3/uL (ref 4.0–10.5)

## 2015-04-29 LAB — CBG MONITORING, ED: Glucose-Capillary: 159 mg/dL — ABNORMAL HIGH (ref 65–99)

## 2015-04-29 MED ORDER — INSULIN GLARGINE 100 UNIT/ML SOLOSTAR PEN: 15.0000 [IU] | PEN_INJECTOR | Freq: Every day | SUBCUTANEOUS | Status: DC

## 2015-04-29 MED ORDER — METFORMIN HCL ER 500 MG PO TB24: 500.0000 mg | ORAL_TABLET | Freq: Two times a day (BID) | ORAL | Status: DC

## 2015-04-29 MED ORDER — GLIPIZIDE 5 MG PO TABS: 5.0000 mg | ORAL_TABLET | Freq: Two times a day (BID) | ORAL | Status: DC

## 2015-04-29 MED ORDER — OXYCODONE-ACETAMINOPHEN 5-325 MG PO TABS
2.0000 | ORAL_TABLET | Freq: Once | ORAL | Status: AC
  Administered 2015-04-29: 2 via ORAL
  Filled 2015-04-29: qty 2

## 2015-04-29 NOTE — ED Notes (Addendum)
[facility] made aware that pt is being seen in ED and will not being making her appt today. They verbalize understanding and state they will reschedule her treatment. Pt made aware of same.

## 2015-04-29 NOTE — Discharge Instructions (Signed)

## 2015-04-29 NOTE — ED Notes (Signed)
Dr. Docherty at bedside.

## 2015-04-29 NOTE — ED Notes (Signed)
Pt complains of pain in her breast and feet for the past 2 weeks. Pt states she also feels weakness and blurred vision, pt states she stopped taking her diabetes medication 2 weeks ago on her own.

## 2015-04-29 NOTE — ED Provider Notes (Signed)
CSN: [id number]     Arrival date & time 04/29/15  1214 History   First MD Initiated Contact with Patient 04/29/15 1226     Chief Complaint  Patient presents with  . Breast Pain  . Weakness     (Consider location/radiation/quality/duration/timing/severity/associated sxs/prior Treatment) Patient is a 49 y.o. female presenting with weakness. The history is provided by the patient. No language interpreter was used.  Weakness This is a new problem. The current episode started 12 to 24 hours ago. The problem occurs constantly. The problem has not changed since onset.Pertinent negatives include no chest pain, no abdominal pain, no headaches and no shortness of breath. Nothing aggravates the symptoms. Nothing relieves the symptoms. She has tried nothing for the symptoms. The treatment provided no relief.    Past Medical History  Diagnosis Date  . Mental disorder     depression-  . Arthritis   . Hot flashes   . Head ache   . Malignant neoplasm of breast (female), unspecified site   . Family history of malignant neoplasm of breast   . Depression     pt denies   . Anemia     low iron  . Newly diagnosed diabetes 11/12/2014   Past Surgical History  Procedure Laterality Date  . Knee surgery  1995    right  . Foot surgery  2011    rt  . Tubal ligation    . Uterine fibroid surgery  2005    ablation  . Breast lumpectomy with sentinel lymph node biopsy Left 06/11/2014    Procedure: BREAST LUMPECTOMY WITH SENTINEL LYMPH NODE BX, POSSIBLE AXILLARY LYMPH NODE DISSECTION;  Surgeon: Stark Klein, MD;  Location: [facility];  Service: General;  Laterality: Left;  . Portacath placement N/A 07/02/2014    Procedure: INSERTION PORT-A-CATH;  Surgeon: Stark Klein, MD;  Location: [facility];  Service: General;  Laterality: N/A;  . Incision and drainage Left 07/02/2014    Procedure: Drainage left breast seroma;  Surgeon: Stark Klein, MD;  Location: [facility];   Service: General;  Laterality: Left;  . Evacuation breast hematoma Left 07/10/2014    Procedure: EVACUATION OF LEFT BREAST HEMATOMA ;  Surgeon: Stark Klein, MD;  Location: [facility];  Service: General;  Laterality: Left;  . Incision and drainage abscess N/A 11/12/2014    Procedure: ASPIRATION AND INCISION AND DRAINAGE LEFT BREAST ABSCESS;  Surgeon: Michael Boston, MD;  Location: [facility];  Service: General;  Laterality: N/A;   Family History  Problem Relation Age of Onset  . Breast cancer Mother 53    currently 31; TAH/BSO d/t ?cervical ca at 5  . Breast cancer Maternal Aunt     dx 104s; currently in her late 27s  . Lung cancer Father     deceased 18  . Diabetes Neg Hx    History  Substance Use Topics  . Smoking status: Current Every Day Smoker -- 0.25 packs/day    Types: Cigarettes  . Smokeless tobacco: Never Used  . Alcohol Use: No     Comment: Percoset,Vicodin, Oxycotin   OB History    Gravida Para Term Preterm AB TAB SAB Ectopic Multiple Living   '3 3 3       3     '$ Review of Systems  Constitutional: Negative for fever, chills, diaphoresis, activity change, appetite change and fatigue.  HENT: Negative for congestion, facial swelling, rhinorrhea and sore throat.   Eyes: Negative for photophobia and discharge.  Respiratory:  Negative for cough, chest tightness and shortness of breath.   Cardiovascular: Negative for chest pain, palpitations and leg swelling.  Gastrointestinal: Negative for nausea, vomiting, abdominal pain and diarrhea.  Endocrine: Negative for polydipsia and polyuria.  Genitourinary: Negative for dysuria, frequency, difficulty urinating and pelvic pain.  Musculoskeletal: Negative for back pain, arthralgias, neck pain and neck stiffness.  Skin: Negative for color change and wound.  Allergic/Immunologic: Negative for immunocompromised state.  Neurological: Positive for weakness. Negative for facial asymmetry, numbness and headaches.  Hematological: Does not bruise/bleed  easily.  Psychiatric/Behavioral: Negative for confusion and agitation.      Allergies  Hydrocodone  Home Medications   Prior to Admission medications   Medication Sig Start Date End Date Taking? Authorizing Provider  ALPRAZolam Duanne Moron) 1 MG tablet Take 1 mg by mouth at bedtime as needed for sleep.  12/24/14  Yes Historical Provider, MD  bismuth subsalicylate (PEPTO BISMOL) 262 MG/15ML suspension Take 30 mLs by mouth every 6 (six) hours as needed for indigestion (indigestion).   Yes Historical Provider, MD  ibuprofen (ADVIL,MOTRIN) 200 MG tablet Take 1 tablet (200 mg total) by mouth every 6 (six) hours as needed for moderate pain. Patient taking differently: Take 200-400 mg by mouth every 6 (six) hours as needed for moderate pain.  09/09/14  Yes Chauncey Cruel, MD  clindamycin (CLEOCIN-T) 1 % external solution Apply topically 2 (two) times daily. Patient not taking: Reported on 04/28/2015 03/02/15   Maryanna Shape, NP  dexamethasone (DECADRON) 4 MG tablet TAKE 2 TABLETS DAILY ON THE DAY AFTER CHEMO THEN 2 TABLETS 2 TIMES DAILY FOR 2 DAYS WITH FOOD Patient not taking: Reported on 03/19/2015 09/30/14   Chauncey Cruel, MD  glipiZIDE (GLUCOTROL) 5 MG tablet Take 1 tablet (5 mg total) by mouth 2 (two) times daily before a meal. 04/29/15   Ernestina Patches, MD  Insulin Glargine (LANTUS) 100 UNIT/ML Solostar Pen Inject 15 Units into the skin daily at 10 pm. 04/29/15   Ernestina Patches, MD  lidocaine (LIDODERM) 5 % Place 1 patch onto the skin daily. Remove & Discard patch within 12 hours or as directed by MD Patient not taking: Reported on 04/29/2015 01/01/15   Margarita Mail, PA-C  lidocaine-prilocaine (EMLA) cream Apply 1 application topically as needed. Patient not taking: Reported on 04/29/2015 07/15/14   Laurie Panda, NP  metFORMIN (GLUCOPHAGE-XR) 500 MG 24 hr tablet Take 1 tablet (500 mg total) by mouth 2 (two) times daily with a meal. 04/29/15   Ernestina Patches, MD  mupirocin cream (BACTROBAN) 2  % Apply 1 application topically 2 (two) times daily. Patient not taking: Reported on 04/29/2015 03/02/15   Maryanna Shape, NP  naproxen (EC NAPROSYN) 500 MG EC tablet Take 1 tablet (500 mg total) by mouth 2 (two) times daily with a meal. Patient not taking: Reported on 03/09/2015 01/01/15   Margarita Mail, PA-C  nicotine (NICODERM CQ) 21 mg/24hr patch Place 1 patch (21 mg total) onto the skin daily. Patient not taking: Reported on 03/09/2015 09/02/14   Chauncey Cruel, MD  omeprazole (PRILOSEC) 40 MG capsule Take 1 capsule (40 mg total) by mouth at bedtime. Patient not taking: Reported on 04/29/2015 12/29/14   Laurie Panda, NP  ondansetron (ZOFRAN ODT) 8 MG disintegrating tablet Take 1 tablet (8 mg total) by mouth every 8 (eight) hours as needed for nausea or vomiting. Patient not taking: Reported on 03/09/2015 12/22/14   Laurie Panda, NP  prochlorperazine (COMPAZINE) 10 MG tablet Take  1 tablet (10 mg total) by mouth every 6 (six) hours as needed for nausea or vomiting. Patient not taking: Reported on 03/09/2015 12/29/14   Laurie Panda, NP  ranitidine (ZANTAC) 150 MG tablet Take 1 tablet (150 mg total) by mouth 2 (two) times daily. Patient not taking: Reported on 04/29/2015 06/03/14   Ashley Murrain, NP   BP 123/81 mmHg  Pulse 78  Temp(Src) 97.9 F (36.6 C) (Oral)  Resp 20  SpO2 97% Physical Exam  Constitutional: She is oriented to person, place, and time. She appears well-developed and well-nourished. No distress.  HENT:  Head: Normocephalic and atraumatic.  Mouth/Throat: No oropharyngeal exudate.  Eyes: Pupils are equal, round, and reactive to light.  Neck: Normal range of motion. Neck supple.  Cardiovascular: Normal rate, regular rhythm and normal heart sounds.  Exam reveals no gallop and no friction rub.   No murmur heard. Pulmonary/Chest: Effort normal and breath sounds normal. No respiratory distress. She has no wheezes. She has no rales.  Abdominal: Soft. Bowel sounds are normal.  She exhibits no distension and no mass. There is no tenderness. There is no rebound and no guarding.  Musculoskeletal: Normal range of motion. She exhibits no edema or tenderness.  Decreased sensation to the tips of toes bilaterally.   Neurological: She is alert and oriented to person, place, and time.  Skin: Skin is warm and dry.  Psychiatric: She has a normal mood and affect.    ED Course  Procedures (including critical care time) Labs Review Labs Reviewed  BASIC METABOLIC PANEL - Abnormal; Notable for the following:    Glucose, Bld 138 (*)    All other components within normal limits  CBG MONITORING, ED - Abnormal; Notable for the following:    Glucose-Capillary 159 (*)    All other components within normal limits  CBC WITH DIFFERENTIAL/PLATELET    Imaging Review No results found.   EKG Interpretation None      MDM   Final diagnoses:  Breast pain  Peripheral neuropathy  Type 2 diabetes mellitus without complication    Pt is a 49 y.o. female with Pmhx as above who presents with recurrent breast pain (since having 3 breast surgeris, generalized weakness, and burning pain in her toes bilaterally.  Pain has been present for approximately one month.  Breast pain is chronic, unchanged today. She denies fevers or chills.  She stopped taking her diabetes medication about 1 month ago, she believed her diagnosis of diabetes and from her chemotherapy which she finished in may.  On physical exam, vital signs are stable.  She is afebrile, in no acute distress.  She has lymphedema of the left breast which is chronic, there are no areas of erythema, fluctuance, no drainage from the nipple.  Current pulmonary exam is benign.  She is decreased sensation to the toes in a symmetric pattern.  Believes this could be due to either her chemotherapy or her diabetes, but is likely peripheral neuropathy.  She has a mammogram scheduled July 8 and is scheduled to take out her port. Basic labs nml. I do  not feel pt requires further ED w/u today as symptoms are chronic. Pt missed her cancer center appoint today for ED visit.    [name] evaluation in the Emergency Department is complete. It has been determined that no acute conditions requiring further emergency intervention are present at this time. The patient/guardian have been advised of the diagnosis and plan. We have discussed signs and symptoms  that warrant return to the ED, such as changes or worsening in symptoms, fever, redness, swelling.       Ernestina Patches, MD 05/01/15 1124

## 2015-04-29 NOTE — ED Notes (Signed)
Pt reports her onc MD told her her chemo drug had a steroid in it that could cause hyperglycemia so after she finished her chemo, she threw away all DM meds. Has not taken those meds in approx 2 weeks. C/o pain to breast and feet. C/o blurred vision and weakness and is concerned it is related to not taking DM meds. CBG 159 on arrival.

## 2015-04-30 ENCOUNTER — Telehealth: Payer: Self-pay

## 2015-04-30 ENCOUNTER — Ambulatory Visit
Admission: RE | Admit: 2015-04-30 | Discharge: 2015-04-30 | Disposition: A | Discharge status: Home / Self Care | Payer: Medicaid Other | Source: Ambulatory Visit | Attending: Radiation Oncology | Admitting: Radiation Oncology

## 2015-04-30 ENCOUNTER — Ambulatory Visit: Payer: Medicaid Other

## 2015-04-30 NOTE — Telephone Encounter (Signed)
Patient was seen in the emergency room on Wednesday 04/29/15 and released.No show for radiation treatment the last 2 days.Called cell phone no answer, mail box is full.I will attempt to contact again but knew there may be possibility of no show as patient expressed apprehension of continuing treatment.Hoped she would at least continue until she spoke with Dr.Wentworth next week.

## 2015-05-01 ENCOUNTER — Ambulatory Visit
Admission: RE | Admit: 2015-05-01 | Discharge: 2015-05-01 | Disposition: A | Discharge status: Home / Self Care | Payer: Medicaid Other | Source: Ambulatory Visit | Attending: Radiation Oncology | Admitting: Radiation Oncology

## 2015-05-05 ENCOUNTER — Ambulatory Visit
Admission: RE | Admit: 2015-05-05 | Discharge: 2015-05-05 | Disposition: A | Discharge status: Home / Self Care | Payer: Medicaid Other | Source: Ambulatory Visit | Attending: Radiation Oncology | Admitting: Radiation Oncology

## 2015-05-05 ENCOUNTER — Ambulatory Visit: Payer: Medicaid Other

## 2015-05-05 ENCOUNTER — Ambulatory Visit: Payer: No Typology Code available for payment source | Attending: Radiation Oncology | Admitting: Radiation Oncology

## 2015-05-06 ENCOUNTER — Other Ambulatory Visit: Payer: Self-pay | Admitting: General Surgery

## 2015-05-06 ENCOUNTER — Ambulatory Visit
Admission: RE | Admit: 2015-05-06 | Discharge: 2015-05-06 | Disposition: A | Discharge status: Home / Self Care | Payer: No Typology Code available for payment source | Source: Ambulatory Visit | Attending: Radiation Oncology | Admitting: Radiation Oncology

## 2015-05-06 ENCOUNTER — Ambulatory Visit
Admission: RE | Admit: 2015-05-06 | Discharge: 2015-05-06 | Disposition: A | Discharge status: Home / Self Care | Payer: Medicaid Other | Source: Ambulatory Visit | Attending: Radiation Oncology | Admitting: Radiation Oncology

## 2015-05-06 VITALS — BP 129/83 | HR 83 | Temp 98.9°F | Wt 181.1 lb

## 2015-05-06 DIAGNOSIS — C50412 Malignant neoplasm of upper-outer quadrant of left female breast: Secondary | ICD-10-CM

## 2015-05-06 DIAGNOSIS — R079 Chest pain, unspecified: Secondary | ICD-10-CM

## 2015-05-06 DIAGNOSIS — Z510 Encounter for antineoplastic radiation therapy: Secondary | ICD-10-CM | POA: Diagnosis not present

## 2015-05-06 NOTE — Progress Notes (Signed)
Weekly Management Note Current Dose:  10.8 Gy  Projected Dose: 61 Gy   Narrative:  The patient presents for routine under treatment assessment.  CBCT/MVCT images/Port film x-rays were reviewed.  The chart was checked. She states she has resumed her diabetic medication. She missed treatments since 6/29, missing a total of 3 treatments. She has a scheduled port removal with Dr. Barry Dienes on 8/1.   Physical Findings: Weight: 181 lb 1.6 oz (82.146 kg). Unchanged skin.   Impression:  The patient is tolerating radiation.  Plan:  Continue treatment as planned. I have called to have her port removal on 8/1 with Dr. Barry Dienes cancelled. Continue radiaplex.   This document serves as a record of services personally performed by Thea Silversmith, MD. It was created on her behalf by Arlyce Harman, a trained medical scribe. The creation of this record is based on the scribe's personal observations and the provider's statements to them. This document has been checked and approved by the attending provider.

## 2015-05-07 ENCOUNTER — Ambulatory Visit
Admission: RE | Admit: 2015-05-07 | Discharge: 2015-05-07 | Disposition: A | Discharge status: Home / Self Care | Payer: Medicaid Other | Source: Ambulatory Visit | Attending: Radiation Oncology | Admitting: Radiation Oncology

## 2015-05-07 DIAGNOSIS — Z510 Encounter for antineoplastic radiation therapy: Secondary | ICD-10-CM | POA: Diagnosis not present

## 2015-05-08 ENCOUNTER — Ambulatory Visit
Admission: RE | Admit: 2015-05-08 | Discharge: 2015-05-08 | Disposition: A | Discharge status: Home / Self Care | Payer: Medicaid Other | Source: Ambulatory Visit | Attending: Radiation Oncology | Admitting: Radiation Oncology

## 2015-05-08 DIAGNOSIS — Z510 Encounter for antineoplastic radiation therapy: Secondary | ICD-10-CM | POA: Diagnosis not present

## 2015-05-11 ENCOUNTER — Ambulatory Visit
Admission: RE | Admit: 2015-05-11 | Discharge: 2015-05-11 | Disposition: A | Discharge status: Home / Self Care | Payer: Medicaid Other | Source: Ambulatory Visit | Attending: Radiation Oncology | Admitting: Radiation Oncology

## 2015-05-11 DIAGNOSIS — Z510 Encounter for antineoplastic radiation therapy: Secondary | ICD-10-CM | POA: Diagnosis not present

## 2015-05-12 ENCOUNTER — Ambulatory Visit
Admission: RE | Admit: 2015-05-12 | Discharge: 2015-05-12 | Disposition: A | Discharge status: Home / Self Care | Payer: No Typology Code available for payment source | Source: Ambulatory Visit | Attending: Radiation Oncology | Admitting: Radiation Oncology

## 2015-05-12 ENCOUNTER — Ambulatory Visit
Admission: RE | Admit: 2015-05-12 | Discharge: 2015-05-12 | Disposition: A | Discharge status: Home / Self Care | Payer: Medicaid Other | Source: Ambulatory Visit | Attending: Radiation Oncology | Admitting: Radiation Oncology

## 2015-05-12 DIAGNOSIS — Z510 Encounter for antineoplastic radiation therapy: Secondary | ICD-10-CM | POA: Diagnosis not present

## 2015-05-12 DIAGNOSIS — C50412 Malignant neoplasm of upper-outer quadrant of left female breast: Secondary | ICD-10-CM

## 2015-05-12 NOTE — Progress Notes (Signed)
Weekly Management Note Current Dose: 18  Gy  Projected Dose: 61 Gy   Narrative:  The patient presents for routine under treatment assessment.  CBCT/MVCT images/Port film x-rays were reviewed.  The chart was checked. Tired. Some breast pain.   Physical Findings: Weight:  . Unchanged skin.   Impression:  The patient is tolerating radiation.  Plan:  Continue treatment as planned. Continue radiaplex.

## 2015-05-13 ENCOUNTER — Ambulatory Visit
Admission: RE | Admit: 2015-05-13 | Discharge: 2015-05-13 | Disposition: A | Discharge status: Home / Self Care | Payer: Medicaid Other | Source: Ambulatory Visit | Attending: Radiation Oncology | Admitting: Radiation Oncology

## 2015-05-13 DIAGNOSIS — Z510 Encounter for antineoplastic radiation therapy: Secondary | ICD-10-CM | POA: Diagnosis not present

## 2015-05-14 ENCOUNTER — Ambulatory Visit
Admission: RE | Admit: 2015-05-14 | Discharge: 2015-05-14 | Disposition: A | Discharge status: Home / Self Care | Payer: Medicaid Other | Source: Ambulatory Visit | Attending: Radiation Oncology | Admitting: Radiation Oncology

## 2015-05-14 DIAGNOSIS — Z510 Encounter for antineoplastic radiation therapy: Secondary | ICD-10-CM | POA: Diagnosis not present

## 2015-05-15 ENCOUNTER — Ambulatory Visit: Payer: Medicaid Other

## 2015-05-15 ENCOUNTER — Ambulatory Visit
Admission: RE | Admit: 2015-05-15 | Discharge: 2015-05-15 | Disposition: A | Discharge status: Home / Self Care | Payer: Medicaid Other | Source: Ambulatory Visit | Attending: Radiation Oncology | Admitting: Radiation Oncology

## 2015-05-18 ENCOUNTER — Ambulatory Visit: Payer: Medicaid Other

## 2015-05-18 ENCOUNTER — Ambulatory Visit
Admission: RE | Admit: 2015-05-18 | Discharge: 2015-05-18 | Disposition: A | Discharge status: Home / Self Care | Payer: Medicaid Other | Source: Ambulatory Visit | Attending: Radiation Oncology | Admitting: Radiation Oncology

## 2015-05-19 ENCOUNTER — Ambulatory Visit: Payer: Medicaid Other

## 2015-05-19 ENCOUNTER — Ambulatory Visit: Payer: No Typology Code available for payment source | Admitting: Radiation Oncology

## 2015-05-20 ENCOUNTER — Ambulatory Visit
Admission: RE | Admit: 2015-05-20 | Discharge: 2015-05-20 | Disposition: A | Discharge status: Home / Self Care | Payer: Medicaid Other | Source: Ambulatory Visit | Attending: Radiation Oncology | Admitting: Radiation Oncology

## 2015-05-20 ENCOUNTER — Ambulatory Visit
Admission: RE | Admit: 2015-05-20 | Discharge: 2015-05-20 | Disposition: A | Discharge status: Home / Self Care | Payer: No Typology Code available for payment source | Source: Ambulatory Visit | Attending: Radiation Oncology | Admitting: Radiation Oncology

## 2015-05-20 VITALS — BP 124/92 | HR 93 | Temp 98.6°F | Wt 176.1 lb

## 2015-05-20 DIAGNOSIS — C50412 Malignant neoplasm of upper-outer quadrant of left female breast: Secondary | ICD-10-CM

## 2015-05-20 DIAGNOSIS — Z510 Encounter for antineoplastic radiation therapy: Secondary | ICD-10-CM | POA: Diagnosis not present

## 2015-05-20 NOTE — Progress Notes (Signed)
Weekly Management Note Current Dose:  23.4 Gy  Projected Dose: 61 Gy   Narrative:  The patient presents for routine under treatment assessment. She has missed 4 treatments since last week which she states are for "family reasons.'  She says her breast is sore and requests narcotics.  She does not receive narcotics from Dr [name] any longer.  The CBCT images, MVCT images, port film x-rays were reviewed and the chart was checked.  Physical Findings: Her weight is currently stable and she is alert and oriented X3. Slightly dark and pink left breast.   Impression: Laura Matthews is a 49 year old female presenting to clinic in regards to her cancer of the left female breast. The patient is tolerating radiation.   Plan:  She was advised to continue treatment as planned and we discussed her compliance.  She did not want a social work referral. I declined narcotics at this time but encouraged her to try OTC ibuprofen or tylenol first.  She will continue administration of Radiaplex medication as needed.   This document serves as a record of services personally performed by Thea Silversmith , MD. It was created on her behalf by Lenn Cal, a trained medical scribe. The creation of this record is based on the scribe's personal observations and the provider's statements to them. This document has been checked and approved by the attending provider.   ___________________________________  Thea Silversmith, MD

## 2015-05-21 ENCOUNTER — Ambulatory Visit: Payer: Medicaid Other

## 2015-05-22 ENCOUNTER — Ambulatory Visit
Admission: RE | Admit: 2015-05-22 | Discharge: 2015-05-22 | Disposition: A | Discharge status: Home / Self Care | Payer: Medicaid Other | Source: Ambulatory Visit | Attending: Radiation Oncology | Admitting: Radiation Oncology

## 2015-05-22 DIAGNOSIS — Z510 Encounter for antineoplastic radiation therapy: Secondary | ICD-10-CM | POA: Diagnosis not present

## 2015-05-25 ENCOUNTER — Ambulatory Visit
Admission: RE | Admit: 2015-05-25 | Discharge: 2015-05-25 | Disposition: A | Discharge status: Home / Self Care | Payer: Medicaid Other | Source: Ambulatory Visit | Attending: Radiation Oncology | Admitting: Radiation Oncology

## 2015-05-25 DIAGNOSIS — Z510 Encounter for antineoplastic radiation therapy: Secondary | ICD-10-CM | POA: Diagnosis not present

## 2015-05-26 ENCOUNTER — Ambulatory Visit
Admission: RE | Admit: 2015-05-26 | Discharge: 2015-05-26 | Disposition: A | Discharge status: Home / Self Care | Payer: No Typology Code available for payment source | Source: Ambulatory Visit | Attending: Radiation Oncology | Admitting: Radiation Oncology

## 2015-05-26 ENCOUNTER — Other Ambulatory Visit: Payer: Self-pay | Admitting: Nurse Practitioner

## 2015-05-26 ENCOUNTER — Encounter: Payer: Self-pay | Admitting: Radiation Oncology

## 2015-05-26 ENCOUNTER — Ambulatory Visit
Admission: RE | Admit: 2015-05-26 | Discharge: 2015-05-26 | Disposition: A | Discharge status: Home / Self Care | Payer: Medicaid Other | Source: Ambulatory Visit | Attending: Radiation Oncology | Admitting: Radiation Oncology

## 2015-05-26 ENCOUNTER — Ambulatory Visit: Payer: Medicaid Other | Admitting: Radiation Oncology

## 2015-05-26 VITALS — BP 131/76 | HR 88 | Temp 97.8°F | Resp 20 | Wt 179.7 lb

## 2015-05-26 DIAGNOSIS — C50412 Malignant neoplasm of upper-outer quadrant of left female breast: Secondary | ICD-10-CM

## 2015-05-26 DIAGNOSIS — Z510 Encounter for antineoplastic radiation therapy: Secondary | ICD-10-CM | POA: Diagnosis not present

## 2015-05-26 NOTE — Progress Notes (Signed)
  Radiation Oncology         [phone] ________________________________  Name: Laura Matthews MRN: [id number]  Date: 05/26/2015  DOB: Jun 25, 1966  Weekly Radiation Therapy Management  T1cN3 invasive ductal carcinoma of the left breast  Current Dose: 28.8 Gy     Planned Dose:  61 Gy  Narrative . . . . . . . . The patient presents for routine under treatment assessment.                                   The patient is with some breast pain, "Mostly at night, when I go to bed, I can just deal with it", gave pillow to see if that helps.                                 Set-up films were reviewed.                                 The chart was checked. Physical Findings. . .  weight is 179 lb 11.2 oz (81.511 kg). Her oral temperature is 97.8 F (36.6 C). Her blood pressure is 131/76 and her pulse is 88. Her respiration is 20. . Weight essentially stable.  No significant changes. Darkening and swelling of the breast. No skin breakdown. Lungs are clear. Heart has regular rhythm and rate. Impression . . . . . . . The patient is tolerating radiation. Plan . . . . . . . . . . . . Continue treatment as planned.  This document serves as a record of services personally performed by Gery Pray, MD. It was created on his behalf by Arlyce Harman, a trained medical scribe. The creation of this record is based on the scribe's personal observations and the provider's statements to them. This document has been checked and approved by the attending provider. ________________________________   Blair Promise, PhD, MD

## 2015-05-26 NOTE — Progress Notes (Addendum)
Weekly rad txs left breast, 15 completed rad txs, tanning, under axilla has peeling, dry, swollen, uses only rad iaplex gel bid, Ibuprofen/tylenol didn't hel;p breast pain,"Mostly at night, when I go to bed,  I can just deal with it"gave pillow to see if that helps, also gave 1 box(%total) gel pads, and 2 tube mesh tops, gave instructions of use, , keep them in refrig only not freezer, use prn, verbbal understanding teach back method 2:15 PM BP 131/76 mmHg  Pulse 88  Temp(Src) 97.8 F (36.6 C) (Oral)  Resp 20  Wt 179 lb 11.2 oz (81.511 kg)  Wt Readings from Last 3 Encounters:  05/26/15 179 lb 11.2 oz (81.511 kg)  05/20/15 176 lb 1.6 oz (79.878 kg)  05/06/15 181 lb 1.6 oz (82.146 kg)

## 2015-05-27 ENCOUNTER — Ambulatory Visit
Admission: RE | Admit: 2015-05-27 | Discharge: 2015-05-27 | Disposition: A | Discharge status: Home / Self Care | Payer: Medicaid Other | Source: Ambulatory Visit | Attending: Radiation Oncology | Admitting: Radiation Oncology

## 2015-05-27 DIAGNOSIS — Z510 Encounter for antineoplastic radiation therapy: Secondary | ICD-10-CM | POA: Diagnosis not present

## 2015-05-28 ENCOUNTER — Ambulatory Visit
Admission: RE | Admit: 2015-05-28 | Discharge: 2015-05-28 | Disposition: A | Discharge status: Home / Self Care | Payer: Medicaid Other | Source: Ambulatory Visit | Attending: Radiation Oncology | Admitting: Radiation Oncology

## 2015-05-28 ENCOUNTER — Ambulatory Visit: Payer: Medicaid Other

## 2015-05-28 DIAGNOSIS — Z510 Encounter for antineoplastic radiation therapy: Secondary | ICD-10-CM | POA: Diagnosis not present

## 2015-05-29 ENCOUNTER — Ambulatory Visit
Admission: RE | Admit: 2015-05-29 | Discharge: 2015-05-29 | Disposition: A | Discharge status: Home / Self Care | Payer: Medicaid Other | Source: Ambulatory Visit | Attending: Radiation Oncology | Admitting: Radiation Oncology

## 2015-05-29 ENCOUNTER — Ambulatory Visit: Payer: Medicaid Other

## 2015-05-29 DIAGNOSIS — Z510 Encounter for antineoplastic radiation therapy: Secondary | ICD-10-CM | POA: Diagnosis not present

## 2015-05-31 ENCOUNTER — Ambulatory Visit: Payer: Medicaid Other

## 2015-06-01 ENCOUNTER — Ambulatory Visit: Payer: Medicaid Other

## 2015-06-01 ENCOUNTER — Ambulatory Visit
Admission: RE | Admit: 2015-06-01 | Discharge: 2015-06-01 | Disposition: A | Discharge status: Home / Self Care | Payer: Medicaid Other | Source: Ambulatory Visit | Attending: Radiation Oncology | Admitting: Radiation Oncology

## 2015-06-02 ENCOUNTER — Ambulatory Visit: Payer: Medicaid Other

## 2015-06-02 ENCOUNTER — Ambulatory Visit
Admission: RE | Admit: 2015-06-02 | Discharge: 2015-06-02 | Disposition: A | Discharge status: Home / Self Care | Payer: Medicaid Other | Source: Ambulatory Visit | Attending: Radiation Oncology | Admitting: Radiation Oncology

## 2015-06-02 ENCOUNTER — Ambulatory Visit: Payer: Medicaid Other | Admitting: Radiation Oncology

## 2015-06-02 ENCOUNTER — Ambulatory Visit
Admission: RE | Admit: 2015-06-02 | Discharge: 2015-06-02 | Disposition: A | Discharge status: Home / Self Care | Payer: No Typology Code available for payment source | Source: Ambulatory Visit | Attending: Radiation Oncology | Admitting: Radiation Oncology

## 2015-06-02 VITALS — BP 118/79 | HR 75 | Temp 98.9°F | Wt 179.5 lb

## 2015-06-02 DIAGNOSIS — C50412 Malignant neoplasm of upper-outer quadrant of left female breast: Secondary | ICD-10-CM

## 2015-06-02 DIAGNOSIS — Z510 Encounter for antineoplastic radiation therapy: Secondary | ICD-10-CM | POA: Diagnosis not present

## 2015-06-02 NOTE — Progress Notes (Signed)
Weekly Management Note Current Dose:  34.2 Gy  Projected Dose: 61 Gy   Narrative:  The patient presents for routine under treatment assessment. The CBCT images, MVCT images, port film x-rays were reviewed and the chart was checked. Doing well. Still has left breast pain, but it is stable. Reports soreness under the left breast with an area of dry desquamation.  Physical Findings: Her weight is currently stable and she is alert and oriented X3. Left inframammary fold dry desquamation. Filed Vitals:   06/02/15 1426  BP: 118/79  Pulse: 75  Temp: 98.9 F (37.2 C)  Weight: 179 lb 8 oz (81.421 kg)   Impression: Laura Matthews is a 49 year old female presenting to clinic in regards to her cancer of the left female breast. The patient is tolerating radiation.   Plan:  She was advised to continue treatment as planned.  She will continue administration of Radiaplex medication as needed. If the area in the inframammary fold does not improve within a week, genetian violet would be prescribed.  This document serves as a record of services personally performed by Thea Silversmith, MD. It was created on her behalf by Darcus Austin, a trained medical scribe. The creation of this record is based on the scribe's personal observations and the provider's statements to them. This document has been checked and approved by the attending provider.   ___________________________________  Thea Silversmith, MD

## 2015-06-02 NOTE — Progress Notes (Signed)
Weekly radiation treatment to left breast.Completed 19 of 33 treatments.Has dry peel of left mammary fold which is healing.denies pain.Continued fatigue.sad affect.Continue application of radiaplex.

## 2015-06-03 ENCOUNTER — Ambulatory Visit: Payer: Medicaid Other

## 2015-06-03 ENCOUNTER — Ambulatory Visit
Admission: RE | Admit: 2015-06-03 | Discharge: 2015-06-03 | Disposition: A | Discharge status: Home / Self Care | Payer: Medicaid Other | Source: Ambulatory Visit | Attending: Radiation Oncology | Admitting: Radiation Oncology

## 2015-06-03 DIAGNOSIS — Z510 Encounter for antineoplastic radiation therapy: Secondary | ICD-10-CM | POA: Diagnosis not present

## 2015-06-04 ENCOUNTER — Ambulatory Visit
Admission: RE | Admit: 2015-06-04 | Discharge: 2015-06-04 | Disposition: A | Discharge status: Home / Self Care | Payer: Medicaid Other | Source: Ambulatory Visit | Attending: Radiation Oncology | Admitting: Radiation Oncology

## 2015-06-04 ENCOUNTER — Ambulatory Visit: Payer: Medicaid Other

## 2015-06-04 ENCOUNTER — Ambulatory Visit: Payer: Medicaid Other | Admitting: Radiation Oncology

## 2015-06-04 DIAGNOSIS — Z510 Encounter for antineoplastic radiation therapy: Secondary | ICD-10-CM | POA: Diagnosis not present

## 2015-06-05 ENCOUNTER — Ambulatory Visit: Payer: Medicaid Other

## 2015-06-05 ENCOUNTER — Other Ambulatory Visit: Payer: Self-pay | Admitting: Radiation Oncology

## 2015-06-05 ENCOUNTER — Ambulatory Visit: Payer: Medicaid Other | Admitting: Radiation Oncology

## 2015-06-06 ENCOUNTER — Ambulatory Visit: Payer: Medicaid Other

## 2015-06-08 ENCOUNTER — Ambulatory Visit: Payer: Medicaid Other

## 2015-06-08 ENCOUNTER — Ambulatory Visit
Admission: RE | Admit: 2015-06-08 | Discharge: 2015-06-08 | Disposition: A | Discharge status: Home / Self Care | Payer: Medicaid Other | Source: Ambulatory Visit | Attending: Radiation Oncology | Admitting: Radiation Oncology

## 2015-06-08 ENCOUNTER — Ambulatory Visit: Admission: RE | Admit: 2015-06-08 | Payer: Medicaid Other | Source: Ambulatory Visit | Admitting: Radiation Oncology

## 2015-06-08 DIAGNOSIS — Z510 Encounter for antineoplastic radiation therapy: Secondary | ICD-10-CM | POA: Diagnosis not present

## 2015-06-09 ENCOUNTER — Ambulatory Visit
Admission: RE | Admit: 2015-06-09 | Discharge: 2015-06-09 | Disposition: A | Discharge status: Home / Self Care | Payer: Medicaid Other | Source: Ambulatory Visit | Attending: Radiation Oncology | Admitting: Radiation Oncology

## 2015-06-09 ENCOUNTER — Ambulatory Visit: Payer: Medicaid Other

## 2015-06-09 ENCOUNTER — Ambulatory Visit
Admission: RE | Admit: 2015-06-09 | Discharge: 2015-06-09 | Disposition: A | Discharge status: Home / Self Care | Payer: No Typology Code available for payment source | Source: Ambulatory Visit | Attending: Radiation Oncology | Admitting: Radiation Oncology

## 2015-06-09 ENCOUNTER — Ambulatory Visit: Admission: RE | Admit: 2015-06-09 | Payer: Medicaid Other | Source: Ambulatory Visit | Admitting: Radiation Oncology

## 2015-06-09 ENCOUNTER — Encounter: Payer: Self-pay | Admitting: Radiation Oncology

## 2015-06-09 VITALS — BP 134/83 | HR 84 | Resp 16 | Wt 179.0 lb

## 2015-06-09 DIAGNOSIS — C50412 Malignant neoplasm of upper-outer quadrant of left female breast: Secondary | ICD-10-CM

## 2015-06-09 DIAGNOSIS — Z510 Encounter for antineoplastic radiation therapy: Secondary | ICD-10-CM | POA: Diagnosis not present

## 2015-06-09 NOTE — Progress Notes (Signed)
Weekly Management Note Current Dose:  43.2 Gy  Projected Dose: 61 Gy   Narrative:  The patient presents for routine under treatment assessment.  CBCT/MVCT images/Port film x-rays were reviewed.  The chart was checked. Same irritation under inframammary fold. Using radiaplex. Dry skin on back is new.   Physical Findings: Weight: 179 lb (81.194 kg). Unchanged. Dry desquamation in inframammary fold and axilla. Dry hyperpigmented skin on back.   Impression:  The patient is tolerating radiation.  Plan:  Continue treatment as planned. Discussed compliance. Continue radiaplex.

## 2015-06-09 NOTE — Progress Notes (Signed)
Weight and vitals stable. Denies pain. Reports fatigue. Hyperpigmentation of left breast and left upper back noted. Dry desquamation noted left mammary fold. Reports using radiaplex bid.   BP 134/83 mmHg  Pulse 84  Resp 16  Wt 179 lb (81.194 kg) Wt Readings from Last 3 Encounters:  06/09/15 179 lb (81.194 kg)  06/02/15 179 lb 8 oz (81.421 kg)  05/26/15 179 lb 11.2 oz (81.511 kg)

## 2015-06-10 ENCOUNTER — Ambulatory Visit
Admission: RE | Admit: 2015-06-10 | Discharge: 2015-06-10 | Disposition: A | Discharge status: Home / Self Care | Payer: Medicaid Other | Source: Ambulatory Visit | Attending: Radiation Oncology | Admitting: Radiation Oncology

## 2015-06-10 ENCOUNTER — Ambulatory Visit: Payer: Medicaid Other

## 2015-06-10 ENCOUNTER — Ambulatory Visit: Admission: RE | Admit: 2015-06-10 | Payer: Medicaid Other | Source: Ambulatory Visit | Admitting: Radiation Oncology

## 2015-06-10 DIAGNOSIS — Z510 Encounter for antineoplastic radiation therapy: Secondary | ICD-10-CM | POA: Diagnosis not present

## 2015-06-11 ENCOUNTER — Ambulatory Visit: Payer: Medicaid Other

## 2015-06-11 ENCOUNTER — Ambulatory Visit
Admission: RE | Admit: 2015-06-11 | Discharge: 2015-06-11 | Disposition: A | Discharge status: Home / Self Care | Payer: Medicaid Other | Source: Ambulatory Visit | Attending: Radiation Oncology | Admitting: Radiation Oncology

## 2015-06-11 DIAGNOSIS — Z510 Encounter for antineoplastic radiation therapy: Secondary | ICD-10-CM | POA: Diagnosis not present

## 2015-06-12 ENCOUNTER — Ambulatory Visit: Payer: Medicaid Other

## 2015-06-12 ENCOUNTER — Ambulatory Visit
Admission: RE | Admit: 2015-06-12 | Discharge: 2015-06-12 | Disposition: A | Discharge status: Home / Self Care | Payer: Medicaid Other | Source: Ambulatory Visit | Attending: Radiation Oncology | Admitting: Radiation Oncology

## 2015-06-12 DIAGNOSIS — Z510 Encounter for antineoplastic radiation therapy: Secondary | ICD-10-CM | POA: Diagnosis not present

## 2015-06-15 ENCOUNTER — Ambulatory Visit
Admission: RE | Admit: 2015-06-15 | Discharge: 2015-06-15 | Disposition: A | Discharge status: Home / Self Care | Payer: Medicaid Other | Source: Ambulatory Visit | Attending: Radiation Oncology | Admitting: Radiation Oncology

## 2015-06-15 ENCOUNTER — Ambulatory Visit: Payer: Medicaid Other

## 2015-06-15 DIAGNOSIS — C50412 Malignant neoplasm of upper-outer quadrant of left female breast: Secondary | ICD-10-CM | POA: Diagnosis present

## 2015-06-15 DIAGNOSIS — Z510 Encounter for antineoplastic radiation therapy: Secondary | ICD-10-CM | POA: Diagnosis present

## 2015-06-16 ENCOUNTER — Ambulatory Visit: Payer: Medicaid Other

## 2015-06-16 ENCOUNTER — Ambulatory Visit
Admission: RE | Admit: 2015-06-16 | Discharge: 2015-06-16 | Disposition: A | Discharge status: Home / Self Care | Payer: Medicaid Other | Source: Ambulatory Visit | Attending: Radiation Oncology | Admitting: Radiation Oncology

## 2015-06-16 ENCOUNTER — Encounter: Payer: Self-pay | Admitting: Radiation Oncology

## 2015-06-16 ENCOUNTER — Ambulatory Visit
Admission: RE | Admit: 2015-06-16 | Discharge: 2015-06-16 | Disposition: A | Discharge status: Home / Self Care | Payer: No Typology Code available for payment source | Source: Ambulatory Visit | Attending: Radiation Oncology | Admitting: Radiation Oncology

## 2015-06-16 VITALS — BP 126/84 | HR 85 | Temp 98.5°F | Resp 20 | Wt 183.4 lb

## 2015-06-16 DIAGNOSIS — C50412 Malignant neoplasm of upper-outer quadrant of left female breast: Secondary | ICD-10-CM

## 2015-06-16 DIAGNOSIS — Z510 Encounter for antineoplastic radiation therapy: Secondary | ICD-10-CM | POA: Diagnosis not present

## 2015-06-16 NOTE — Progress Notes (Signed)
Weekly Management Note Current Dose:  52 Gy  Projected Dose: 60 Gy   Narrative:  The patient presents for routine under treatment assessment.  CBCT/MVCT images/Port film x-rays were reviewed.  The chart was checked. Doing well. Sore breast. Has trial tomorrow.  Physical Findings: Weight: 183 lb 6.4 oz (83.19 kg). Unchanged. Dark skin. Healing moist desquamation in the inframammary fold.  Impression:  The patient is tolerating radiation.  Plan:  Continue treatment as planned.

## 2015-06-16 NOTE — Progress Notes (Signed)
Weekly rad txs left breast 29/33 completed, hyperpigmentation on breast,, dry ness peeling on back and under inframmary fold, no pain, small nausea at times, takes pepto bismul for this,  Using radiaplex and biafine  BP 126/84 mmHg  Pulse 85  Temp(Src) 98.5 F (36.9 C) (Oral)  Resp 20  Wt 183 lb 6.4 oz (83.19 kg) 2:17 PM Wt Readings from Last 3 Encounters:  06/09/15 179 lb (81.194 kg)  06/02/15 179 lb 8 oz (81.421 kg)  05/26/15 179 lb 11.2 oz (81.511 kg)

## 2015-06-17 ENCOUNTER — Ambulatory Visit: Payer: Medicaid Other

## 2015-06-17 ENCOUNTER — Ambulatory Visit
Admission: RE | Admit: 2015-06-17 | Discharge: 2015-06-17 | Disposition: A | Discharge status: Home / Self Care | Payer: Medicaid Other | Source: Ambulatory Visit | Attending: Radiation Oncology | Admitting: Radiation Oncology

## 2015-06-17 DIAGNOSIS — Z510 Encounter for antineoplastic radiation therapy: Secondary | ICD-10-CM | POA: Diagnosis not present

## 2015-06-18 ENCOUNTER — Ambulatory Visit: Payer: Medicaid Other

## 2015-06-19 ENCOUNTER — Ambulatory Visit: Payer: Medicaid Other

## 2015-06-19 ENCOUNTER — Ambulatory Visit
Admission: RE | Admit: 2015-06-19 | Discharge: 2015-06-19 | Disposition: A | Discharge status: Home / Self Care | Payer: Medicaid Other | Source: Ambulatory Visit | Attending: Radiation Oncology | Admitting: Radiation Oncology

## 2015-06-19 DIAGNOSIS — Z510 Encounter for antineoplastic radiation therapy: Secondary | ICD-10-CM | POA: Diagnosis not present

## 2015-06-22 ENCOUNTER — Ambulatory Visit: Payer: Medicaid Other

## 2015-06-22 ENCOUNTER — Ambulatory Visit
Admission: RE | Admit: 2015-06-22 | Discharge: 2015-06-22 | Disposition: A | Discharge status: Home / Self Care | Payer: Medicaid Other | Source: Ambulatory Visit | Attending: Radiation Oncology | Admitting: Radiation Oncology

## 2015-06-22 DIAGNOSIS — Z510 Encounter for antineoplastic radiation therapy: Secondary | ICD-10-CM | POA: Diagnosis not present

## 2015-06-23 ENCOUNTER — Ambulatory Visit
Admission: RE | Admit: 2015-06-23 | Discharge: 2015-06-23 | Disposition: A | Discharge status: Home / Self Care | Payer: Medicaid Other | Source: Ambulatory Visit | Attending: Radiation Oncology | Admitting: Radiation Oncology

## 2015-06-23 ENCOUNTER — Other Ambulatory Visit: Payer: Self-pay | Admitting: *Deleted

## 2015-06-23 ENCOUNTER — Ambulatory Visit
Admission: RE | Admit: 2015-06-23 | Discharge: 2015-06-23 | Disposition: A | Discharge status: Home / Self Care | Payer: No Typology Code available for payment source | Source: Ambulatory Visit | Attending: Radiation Oncology | Admitting: Radiation Oncology

## 2015-06-23 VITALS — BP 150/86 | HR 93 | Temp 98.7°F | Wt 180.4 lb

## 2015-06-23 DIAGNOSIS — C50412 Malignant neoplasm of upper-outer quadrant of left female breast: Secondary | ICD-10-CM

## 2015-06-23 DIAGNOSIS — C50919 Malignant neoplasm of unspecified site of unspecified female breast: Secondary | ICD-10-CM

## 2015-06-23 DIAGNOSIS — C773 Secondary and unspecified malignant neoplasm of axilla and upper limb lymph nodes: Secondary | ICD-10-CM

## 2015-06-23 DIAGNOSIS — Z510 Encounter for antineoplastic radiation therapy: Secondary | ICD-10-CM | POA: Diagnosis not present

## 2015-06-23 NOTE — Progress Notes (Signed)
  Radiation Oncology         [phone] ________________________________  Name: Laura Matthews MRN: [id number]  Date: 06/23/2015  DOB: [birth year]  End of Treatment Note  Diagnosis:  Breast cancer of upper-outer quadrant of left female breast   Staging form: Breast, AJCC 7th Edition     Clinical: Stage IA (T1c, N0, cM0) - Unsigned       Staging comments: Staged at breast conference 7.22.15      Pathologic: Stage IIIC (T1c, N3, cM0) - Unsigned    Indication for treatment:  Curative      Radiation treatment dates:   04/22/15-06/23/15  Site/dose:   Left breast/ 45 Gy at 1.8 Gy per fraction x 25 fractions.  Left supraclavicular fossa and axilla/ 45 Gy at 1.8 Gy per fraction x 25 fractions Left breast boost/ 16 Gy at 2 Gy per fraction x 8 fractions  Beams/energy:  Opposed tangents with reduced fields / 6 and  MV photons RAO/LPO 6 and 10 MV photons Enface electrons / 15 MeV  Narrative: The patient tolerated radiation treatment relatively well.   She initially missed several treatments and had a prolonged treatment course.  Towards the end of her treatments she decided that she wanted to "buckley down" and finish her treatments and she did.  She had moist desquamation in the inframammary fold.  She had skin darkening as expected.   Plan: The patient has completed radiation treatment. The patient will return to radiation oncology clinic for routine followup in one month. I advised them to call or return sooner if they have any questions or concerns related to their recovery or treatment. She has follow up scheduled with medical oncology soon.   ------------------------------------------------  Thea Silversmith, MD

## 2015-06-23 NOTE — Progress Notes (Signed)
Completes 33 of 33 treatments to left breast and supraclavicular region.

## 2015-06-24 ENCOUNTER — Other Ambulatory Visit: Payer: Self-pay

## 2015-06-26 ENCOUNTER — Ambulatory Visit: Payer: Medicaid Other

## 2015-06-30 ENCOUNTER — Telehealth: Payer: Self-pay | Admitting: *Deleted

## 2015-06-30 NOTE — Telephone Encounter (Signed)
Called to remind pt about her appt on tomorrow at [facility] with Dr. [name]. Pt confirmed appt for tomorrow and said she will be here for 3:00p appt. Pt verbalized understanding. No further concerns. Message to be fwd to Gentry Fitz, NP.

## 2015-07-01 ENCOUNTER — Encounter: Payer: Self-pay | Admitting: Oncology

## 2015-07-01 ENCOUNTER — Telehealth: Payer: Self-pay | Admitting: Oncology

## 2015-07-01 ENCOUNTER — Ambulatory Visit (HOSPITAL_BASED_OUTPATIENT_CLINIC_OR_DEPARTMENT_OTHER): Payer: Medicaid Other | Admitting: Oncology

## 2015-07-01 ENCOUNTER — Other Ambulatory Visit: Payer: Self-pay | Admitting: Nurse Practitioner

## 2015-07-01 ENCOUNTER — Ambulatory Visit (HOSPITAL_BASED_OUTPATIENT_CLINIC_OR_DEPARTMENT_OTHER): Payer: Medicaid Other

## 2015-07-01 VITALS — BP 151/77 | HR 97 | Temp 98.8°F | Resp 18 | Ht 70.0 in | Wt 178.8 lb

## 2015-07-01 DIAGNOSIS — N951 Menopausal and female climacteric states: Secondary | ICD-10-CM

## 2015-07-01 DIAGNOSIS — C50812 Malignant neoplasm of overlapping sites of left female breast: Secondary | ICD-10-CM

## 2015-07-01 DIAGNOSIS — E119 Type 2 diabetes mellitus without complications: Secondary | ICD-10-CM

## 2015-07-01 DIAGNOSIS — C773 Secondary and unspecified malignant neoplasm of axilla and upper limb lymph nodes: Secondary | ICD-10-CM | POA: Diagnosis not present

## 2015-07-01 DIAGNOSIS — C50412 Malignant neoplasm of upper-outer quadrant of left female breast: Secondary | ICD-10-CM

## 2015-07-01 MED ORDER — GABAPENTIN 300 MG PO CAPS: 300.0000 mg | ORAL_CAPSULE | Freq: Every day | ORAL | Status: DC

## 2015-07-01 MED ORDER — TAMOXIFEN CITRATE 20 MG PO TABS: 20.0000 mg | ORAL_TABLET | Freq: Every day | ORAL | Status: AC

## 2015-07-01 NOTE — Telephone Encounter (Signed)
Gave avs & calendar for November.Marland Kitchen

## 2015-07-01 NOTE — Progress Notes (Signed)
[place]  [phone] [phone]     ID: Laura Matthews DOB: 12/25/1965  MR#: [id number]  NLZ#:[id number]  Patient Care Team: [name], MD as PCP - General (Family Medicine) Stark Klein, MD as Consulting Physician (General Surgery) Chauncey Cruel, MD as Consulting Physician (Oncology) Thea Silversmith, MD as Consulting Physician (Radiation Oncology) [facility] Bunnie Pion, NP as Nurse Practitioner (Nurse Practitioner)  CHIEF COMPLAINT: Stage III left breast cancer  CURRENT TREATMENT:adjuvant chemotherapy  BREAST CANCER HISTORY: From the original intake note:  The patient herself palpated a mass in her left breast and brought it to the attention of [facility] NP 04/22/2014. She palpated a large nontender mass at the 4:00 position as well as a small tender mass in the 6:00 position of the left breast. The patient was set up for imaging at the breast Center where on 05/05/2014 she underwent bilateral diagnostic mammography and left breast ultrasonography. The mammogram showed an irregular mass in the outer left breast with no other suspicious findings. This was firm and fixed at the 3:00 position approximately 10 cm from the nipple. Ultrasound showed a hypoechoic irregular solid mass in this area, measuring 1.8 cm. Ultrasound of the left axilla showed 2 suspicious level I lymph nodes, the largest measuring 1 cm, with a thickened pole. A smaller lymph node measured 0.6 cm but had no visible fatty hilum.  On 05/09/2014 the patient underwent biopsy of the left breast mass (she refused biopsy of the left axilla) which showed ([id number]) and invasive ductal carcinoma, grade 2, estrogen receptor positive at 100%, with strong staining intensity, progesterone receptor 80% positive, with weak staining intensity, with an MIB-1 of 80%, and no HER-2 amplification.  On 05/16/2014 the patient underwent bilateral breast MRI. This showed a 1.7 cm irregular enhancing  mass at the 3:00 position of the left breast associated with the biopsy cleft. There were 3 level I left axillary lymph nodes with thickened cortices. The largest measured 1.3 cm. There were no other findings of concern.  Her subsequent history is as detailed below.  INTERVAL HISTORY: Laura Matthews returns for follow up of her breast cancer. Since her last visit here she completed her radiation treatments. She had significant problems with desquamation, soreness in the breast, and also soreness on the skin of the back, which also peeled. She also has pain in the left shoulder, which she thinks may not be due to the radiation but to the earlier surgery. She has limited range of motion in the left upper extremity. She had some fatigue but not marked with the radiation treatments. She is now ready to consider anti-estrogens  REVIEW OF SYSTEMS:  Laura Matthews has taken herself off all medications, including her diabetic medications. She "does not claim" the diabetes. She may be right in the sense that possibly it was steroid-induced and therefore may have resolved. She has nausea sometimes, but no vomiting. There have been no unusual headaches, visual changes, dizziness, or gait imbalance. There is not been any change in bowel or bladder habits. A detailed review of systems today was otherwise stable  PAST MEDICAL HISTORY: Past Medical History  Diagnosis Date  . Mental disorder     depression-  . Arthritis   . Hot flashes   . Head ache   . Malignant neoplasm of breast (female), unspecified site   . Family history of malignant neoplasm of breast   . Depression     pt denies   . Anemia  low iron  . Newly diagnosed diabetes 11/12/2014    PAST SURGICAL HISTORY: Past Surgical History  Procedure Laterality Date  . Knee surgery  1995    right  . Foot surgery  2011    rt  . Tubal ligation    . Uterine fibroid surgery  2005    ablation  . Breast lumpectomy with sentinel lymph node biopsy Left 06/11/2014     Procedure: BREAST LUMPECTOMY WITH SENTINEL LYMPH NODE BX, POSSIBLE AXILLARY LYMPH NODE DISSECTION;  Surgeon: Stark Klein, MD;  Location: [place];  Service: General;  Laterality: Left;  . Portacath placement N/A 07/02/2014    Procedure: INSERTION PORT-A-CATH;  Surgeon: Stark Klein, MD;  Location: [place];  Service: General;  Laterality: N/A;  . Incision and drainage Left 07/02/2014    Procedure: Drainage left breast seroma;  Surgeon: Stark Klein, MD;  Location: [place];  Service: General;  Laterality: Left;  . Evacuation breast hematoma Left 07/10/2014    Procedure: EVACUATION OF LEFT BREAST HEMATOMA ;  Surgeon: Stark Klein, MD;  Location: [place];  Service: General;  Laterality: Left;  . Incision and drainage abscess N/A 11/12/2014    Procedure: ASPIRATION AND INCISION AND DRAINAGE LEFT BREAST ABSCESS;  Surgeon: Michael Boston, MD;  Location: [facility];  Service: General;  Laterality: N/A;    FAMILY HISTORY Family History  Problem Relation Age of Onset  . Breast cancer Mother 69    currently 68; TAH/BSO d/t ?cervical ca at 64  . Breast cancer Maternal Aunt     dx 50s; currently in her late 72s  . Lung cancer Father     deceased 80  . Diabetes Neg Hx    the patient's father died at the age of 31 from lung cancer. The patient's mother was diagnosed with breast cancer at the age of 67. She survives. The patient had 2 brothers, and 2 sisters. One brother may have a diagnosis of "stomach cancer"  GYNECOLOGIC HISTORY:  No LMP recorded. Patient has had an ablation. Menarche age 44 which is also when the patient first had a child. She is GX P3. She stopped having periods after laser ablation in 2005.  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 She has lived with her daughter Hal Matthews and her 4 children who are 37, 8, 5, and 4. 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 Currently the  patient is living iwith [employer] who works as a Dietitian in [place]. The patient's daughter Laura Matthews works as a Scientist, water quality, also in [place]. The patient has 7 grandchildren. She is not a church attender    ADVANCED DIRECTIVES: Not in place; this was discussed with the patient on her first visit, 05/21/2014, and she indicated she plans to name her daughter Hal Matthews as her healthcare power of attorney   HEALTH MAINTENANCE: Social History  Substance Use Topics  . Smoking status: Current Every Day Smoker -- 0.25 packs/day    Types: Cigarettes  . Smokeless tobacco: Never Used  . Alcohol Use: No     Comment: Percoset,Vicodin, Oxycotin     Colonoscopy:  PAP:  Bone density:  Lipid panel:  Allergies  Allergen Reactions  . Hydrocodone Itching and Nausea Only    Current Outpatient Prescriptions  Medication Sig Dispense Refill  . gabapentin (NEURONTIN) 300 MG capsule Take 1 capsule (300 mg total) by mouth at bedtime. 90 capsule 4  .  tamoxifen (NOLVADEX) 20 MG tablet Take 1 tablet (20 mg total) by mouth daily. 90 tablet 12   No current facility-administered medications for this visit.    OBJECTIVE: Middle-aged [place] American woman in no acute distress Filed Vitals:   07/01/15 1500  BP: 151/77  Pulse: 97  Temp: 98.8 F (37.1 C)  Resp: 18     Body mass index is 25.66 kg/(m^2).    ECOG FS:1 - Symptomatic but completely ambulatory  Sclerae unicteric, EOMs intact Oropharynx and moist No cervical or supraclavicular adenopathy Lungs no rales or rhonchi Heart regular rate and rhythm Abd soft, nontender, positive bowel sounds MSK no focal spinal tenderness, no upper extremity lymphedema; reduced range of motion left upper extremity Neuro: nonfocal, well oriented, appropriate affect Breasts: The right breast is unremarkable. The left breast is status post lumpectomy and radiation. There is some skin edema and significant hyperpigmentation. Skin: There is some dry  desquamation still in the left upper back, at the site of radiation beam exit   LAB RESULTS:  CMP     Component Value Date/Time   NA 140 04/29/2015 1403   NA 141 03/02/2015 1316   K 3.5 04/29/2015 1403   K 4.1 03/02/2015 1316   CL 104 04/29/2015 1403   CO2 25 04/29/2015 1403   CO2 27 03/02/2015 1316   GLUCOSE 138* 04/29/2015 1403   GLUCOSE 91 03/02/2015 1316   BUN 12 04/29/2015 1403   BUN 10.4 03/02/2015 1316   CREATININE 0.48 04/29/2015 1403   CREATININE 0.6 03/02/2015 1316   CALCIUM 9.4 04/29/2015 1403   CALCIUM 9.4 03/02/2015 1316   PROT 7.3 03/02/2015 1316   PROT 6.5 09/17/2014 0700   ALBUMIN 4.1 03/02/2015 1316   ALBUMIN 3.2* 09/17/2014 0700   AST 18 03/02/2015 1316   AST 11 09/17/2014 0700   ALT 25 03/02/2015 1316   ALT 20 09/17/2014 0700   ALKPHOS 68 03/02/2015 1316   ALKPHOS 131* 09/17/2014 0700   BILITOT 0.26 03/02/2015 1316   BILITOT <0.2* 09/17/2014 0700   GFRNONAA >60 04/29/2015 1403   GFRAA >60 04/29/2015 1403    I No results found for: SPEP  Lab Results  Component Value Date   WBC 8.0 04/29/2015   NEUTROABS 5.9 04/29/2015   HGB 12.0 04/29/2015   HCT 36.3 04/29/2015   MCV 88.1 04/29/2015   PLT 316 04/29/2015      Chemistry      Component Value Date/Time   NA 140 04/29/2015 1403   NA 141 03/02/2015 1316   K 3.5 04/29/2015 1403   K 4.1 03/02/2015 1316   CL 104 04/29/2015 1403   CO2 25 04/29/2015 1403   CO2 27 03/02/2015 1316   BUN 12 04/29/2015 1403   BUN 10.4 03/02/2015 1316   CREATININE 0.48 04/29/2015 1403   CREATININE 0.6 03/02/2015 1316      Component Value Date/Time   CALCIUM 9.4 04/29/2015 1403   CALCIUM 9.4 03/02/2015 1316   ALKPHOS 68 03/02/2015 1316   ALKPHOS 131* 09/17/2014 0700   AST 18 03/02/2015 1316   AST 11 09/17/2014 0700   ALT 25 03/02/2015 1316   ALT 20 09/17/2014 0700   BILITOT 0.26 03/02/2015 1316   BILITOT <0.2* 09/17/2014 0700       No results found for: LABCA2  No components found for:  LABCA125  No results for input(s): INR in the last 168 hours.  Urinalysis    Component Value Date/Time   COLORURINE YELLOW 06/03/2014 1050   APPEARANCEUR  CLEAR 06/03/2014 1050   LABSPEC >1.030* 06/03/2014 1050   PHURINE 6.0 06/03/2014 1050   GLUCOSEU NEGATIVE 06/03/2014 1050   HGBUR SMALL* 06/03/2014 1050   BILIRUBINUR SMALL* 06/03/2014 1050   KETONESUR NEGATIVE 06/03/2014 1050   PROTEINUR NEGATIVE 06/03/2014 1050   UROBILINOGEN 0.2 06/03/2014 1050   NITRITE NEGATIVE 06/03/2014 1050   LEUKOCYTESUR TRACE* 06/03/2014 1050    STUDIES: No results found.  ASSESSMENT: 49 y.o. [place] woman status post left breast upper outer quadrant biopsy 05/09/2014 for a clinicalT1c N1, stage IIA invasive ductal carcinoma, grade 2, estrogen and progesterone receptor positive, HER-2 nonamplified, with an MIB-1 of 80%  (1) patient met with genetics counselor 05/29/2014 but decided against genetics testing   (2) tobacco abuse. The patient has been strongly urged to discontinue smoking. She was prescribed a nicotine patch 09/02/2014  (3) status post left lumpectomy and axillary lymph node dissection 06/11/2014 for a pT1c pN3, stage IIIC invasive ductal carcinoma, grade 3, with repeat HER-2 negative  (4) adjuvant chemotherapy consisting of doxorubicin and cyclophosphamide in dose dense fashion x4, completed 09/09/2014, followed by paclitaxel weekly x12 completed 03/09/2015  (5) adjuvant radiation 04/22/15-06/23/15 Left breast/ 45 Gy at 1.8 Gy per fraction x 25 fractions.  Left supraclavicular fossa and axilla/ 45 Gy at 1.8 Gy per fraction x 25 fractions Left breast boost/ 16 Gy at 2 Gy per fraction x 8 fractions  (6) tamoxifen started 07/01/2015  PLAN: Yonna has completed her local treatment and is now ready to start anti-estrogens. She still has your ovaries in place, but she had curettage some years ago so we do not know whether her ovaries are still functioning or not. Accordingly she will  receive tamoxifen for at least the next 2 years.  Today we discussed the possible toxicities, side effects and complications of tamoxifen. She currently has Medicaid so she should have no problem obtaining that medication.  She tells me she already has hot flashes and they particularly bother her at night. Accordingly I also put in a prescription for gabapentin 300 mg to take at bedtime.  She has stopped all her medications included the diabetes medications. We are going to obtain a hemoglobin A1c today. If elevated we will call her and ask her to resume her Glucotrol.  She is very much wanting to have her port removed ASAP.  Tye Maryland will see me again in 3 months. If she is doing well on the tamoxifen we will start broadening the follow-up interval then at her request. Chauncey Cruel, MD 07/01/2015

## 2015-07-02 ENCOUNTER — Other Ambulatory Visit: Payer: Self-pay | Admitting: General Surgery

## 2015-07-02 LAB — HEMOGLOBIN A1C
HEMOGLOBIN A1C: 8.5 % — AB (ref <5.7)
MEAN PLASMA GLUCOSE: 197 mg/dL — AB (ref <117)

## 2015-07-16 ENCOUNTER — Telehealth: Payer: Self-pay | Admitting: *Deleted

## 2015-07-16 ENCOUNTER — Other Ambulatory Visit: Payer: Self-pay | Admitting: General Surgery

## 2015-07-16 ENCOUNTER — Encounter (HOSPITAL_BASED_OUTPATIENT_CLINIC_OR_DEPARTMENT_OTHER): Payer: Self-pay | Admitting: *Deleted

## 2015-07-16 NOTE — Telephone Encounter (Signed)
Patient called saying she has not heard anything from surgical center re:PAC removal.  Spoke with patient.  They were waiting until radiation was finished to remove PAC.  She has been finished about a month.  Let her know to contact Dr. Marlowe Aschoff office [phone] and let them know she has completed radiation.  She appreciated the call back.

## 2015-07-22 ENCOUNTER — Ambulatory Visit (HOSPITAL_BASED_OUTPATIENT_CLINIC_OR_DEPARTMENT_OTHER)
Admission: RE | Admit: 2015-07-22 | Discharge: 2015-07-22 | Disposition: A | Discharge status: Home / Self Care | Payer: Medicaid Other | Source: Ambulatory Visit | Attending: General Surgery | Admitting: General Surgery

## 2015-07-22 ENCOUNTER — Encounter (HOSPITAL_BASED_OUTPATIENT_CLINIC_OR_DEPARTMENT_OTHER): Payer: Self-pay | Admitting: Certified Registered"

## 2015-07-22 ENCOUNTER — Encounter (HOSPITAL_BASED_OUTPATIENT_CLINIC_OR_DEPARTMENT_OTHER)
Admission: RE | Disposition: A | Discharge status: Home / Self Care | Payer: Self-pay | Source: Ambulatory Visit | Attending: General Surgery

## 2015-07-22 ENCOUNTER — Ambulatory Visit (HOSPITAL_BASED_OUTPATIENT_CLINIC_OR_DEPARTMENT_OTHER): Payer: Medicaid Other | Admitting: Certified Registered"

## 2015-07-22 DIAGNOSIS — Z853 Personal history of malignant neoplasm of breast: Secondary | ICD-10-CM | POA: Insufficient documentation

## 2015-07-22 DIAGNOSIS — Z885 Allergy status to narcotic agent status: Secondary | ICD-10-CM | POA: Insufficient documentation

## 2015-07-22 DIAGNOSIS — Z803 Family history of malignant neoplasm of breast: Secondary | ICD-10-CM | POA: Insufficient documentation

## 2015-07-22 DIAGNOSIS — F329 Major depressive disorder, single episode, unspecified: Secondary | ICD-10-CM | POA: Insufficient documentation

## 2015-07-22 DIAGNOSIS — E119 Type 2 diabetes mellitus without complications: Secondary | ICD-10-CM | POA: Diagnosis not present

## 2015-07-22 DIAGNOSIS — Z452 Encounter for adjustment and management of vascular access device: Secondary | ICD-10-CM | POA: Insufficient documentation

## 2015-07-22 DIAGNOSIS — Z170 Estrogen receptor positive status [ER+]: Secondary | ICD-10-CM | POA: Insufficient documentation

## 2015-07-22 DIAGNOSIS — I890 Lymphedema, not elsewhere classified: Secondary | ICD-10-CM | POA: Diagnosis not present

## 2015-07-22 DIAGNOSIS — M1990 Unspecified osteoarthritis, unspecified site: Secondary | ICD-10-CM | POA: Insufficient documentation

## 2015-07-22 DIAGNOSIS — Z801 Family history of malignant neoplasm of trachea, bronchus and lung: Secondary | ICD-10-CM | POA: Diagnosis not present

## 2015-07-22 DIAGNOSIS — F17210 Nicotine dependence, cigarettes, uncomplicated: Secondary | ICD-10-CM | POA: Diagnosis not present

## 2015-07-22 HISTORY — PX: PORT-A-CATH REMOVAL: SHX5289

## 2015-07-22 HISTORY — DX: Lymphedema, not elsewhere classified: I89.0

## 2015-07-22 LAB — GLUCOSE, CAPILLARY
GLUCOSE-CAPILLARY: 126 mg/dL — AB (ref 65–99)
Glucose-Capillary: 205 mg/dL — ABNORMAL HIGH (ref 65–99)

## 2015-07-22 SURGERY — REMOVAL PORT-A-CATH
Anesthesia: General | Site: Chest | Laterality: Left

## 2015-07-22 MED ORDER — GLYCOPYRROLATE 0.2 MG/ML IJ SOLN: 0.2000 mg | Freq: Once | INTRAMUSCULAR | Status: DC | PRN

## 2015-07-22 MED ORDER — FENTANYL CITRATE (PF) 100 MCG/2ML IJ SOLN
INTRAMUSCULAR | Status: AC
  Filled 2015-07-22: qty 4

## 2015-07-22 MED ORDER — CEFAZOLIN SODIUM-DEXTROSE 2-3 GM-% IV SOLR
2.0000 g | INTRAVENOUS | Status: AC
  Administered 2015-07-22: 2 g via INTRAVENOUS

## 2015-07-22 MED ORDER — DEXAMETHASONE SODIUM PHOSPHATE 4 MG/ML IJ SOLN
INTRAMUSCULAR | Status: DC | PRN
  Administered 2015-07-22: 4 mg via INTRAVENOUS

## 2015-07-22 MED ORDER — LIDOCAINE HCL (CARDIAC) 20 MG/ML IV SOLN
INTRAVENOUS | Status: DC | PRN
  Administered 2015-07-22: 80 mg via INTRAVENOUS

## 2015-07-22 MED ORDER — HYDROMORPHONE HCL 1 MG/ML IJ SOLN: 0.2500 mg | INTRAMUSCULAR | Status: DC | PRN

## 2015-07-22 MED ORDER — SODIUM CHLORIDE 0.9 % IJ SOLN: 3.0000 mL | Freq: Two times a day (BID) | INTRAMUSCULAR | Status: DC

## 2015-07-22 MED ORDER — OXYCODONE HCL 5 MG PO TABS: 5.0000 mg | ORAL_TABLET | ORAL | Status: DC | PRN

## 2015-07-22 MED ORDER — DEXAMETHASONE SODIUM PHOSPHATE 10 MG/ML IJ SOLN
INTRAMUSCULAR | Status: AC
  Filled 2015-07-22: qty 1

## 2015-07-22 MED ORDER — CEFAZOLIN SODIUM-DEXTROSE 2-3 GM-% IV SOLR
INTRAVENOUS | Status: AC
  Filled 2015-07-22: qty 50

## 2015-07-22 MED ORDER — ACETAMINOPHEN 325 MG PO TABS
650.0000 mg | ORAL_TABLET | ORAL | Status: DC | PRN
Start: 2015-07-22 — End: 2015-07-22

## 2015-07-22 MED ORDER — ONDANSETRON HCL 4 MG/2ML IJ SOLN
INTRAMUSCULAR | Status: DC | PRN
  Administered 2015-07-22: 4 mg via INTRAVENOUS

## 2015-07-22 MED ORDER — LACTATED RINGERS IV SOLN
INTRAVENOUS | Status: DC
  Administered 2015-07-22 (×3): via INTRAVENOUS

## 2015-07-22 MED ORDER — MIDAZOLAM HCL 2 MG/2ML IJ SOLN
1.0000 mg | INTRAMUSCULAR | Status: DC | PRN
  Administered 2015-07-22: 2 mg via INTRAVENOUS

## 2015-07-22 MED ORDER — LACTATED RINGERS IV SOLN: INTRAVENOUS | Status: DC

## 2015-07-22 MED ORDER — LIDOCAINE HCL (CARDIAC) 20 MG/ML IV SOLN
INTRAVENOUS | Status: AC
  Filled 2015-07-22: qty 5

## 2015-07-22 MED ORDER — SCOPOLAMINE 1 MG/3DAYS TD PT72: 1.0000 | MEDICATED_PATCH | Freq: Once | TRANSDERMAL | Status: DC | PRN

## 2015-07-22 MED ORDER — ACETAMINOPHEN 650 MG RE SUPP: 650.0000 mg | RECTAL | Status: DC | PRN

## 2015-07-22 MED ORDER — BUPIVACAINE-EPINEPHRINE 0.5% -1:200000 IJ SOLN
INTRAMUSCULAR | Status: DC | PRN
  Administered 2015-07-22: 10 mL

## 2015-07-22 MED ORDER — MIDAZOLAM HCL 2 MG/2ML IJ SOLN
INTRAMUSCULAR | Status: AC
  Filled 2015-07-22: qty 4

## 2015-07-22 MED ORDER — OXYCODONE HCL 5 MG PO TABS: 5.0000 mg | ORAL_TABLET | Freq: Four times a day (QID) | ORAL | Status: DC | PRN

## 2015-07-22 MED ORDER — PROMETHAZINE HCL 25 MG/ML IJ SOLN: 6.2500 mg | INTRAMUSCULAR | Status: DC | PRN

## 2015-07-22 MED ORDER — LIDOCAINE-EPINEPHRINE (PF) 1 %-1:200000 IJ SOLN
INTRAMUSCULAR | Status: DC | PRN
  Administered 2015-07-22: 10 mL

## 2015-07-22 MED ORDER — SODIUM CHLORIDE 0.9 % IJ SOLN: 3.0000 mL | INTRAMUSCULAR | Status: DC | PRN

## 2015-07-22 MED ORDER — SODIUM CHLORIDE 0.9 % IV SOLN: 250.0000 mL | INTRAVENOUS | Status: DC | PRN

## 2015-07-22 MED ORDER — ONDANSETRON HCL 4 MG/2ML IJ SOLN
INTRAMUSCULAR | Status: AC
  Filled 2015-07-22: qty 2

## 2015-07-22 MED ORDER — MEPERIDINE HCL 25 MG/ML IJ SOLN: 6.2500 mg | INTRAMUSCULAR | Status: DC | PRN

## 2015-07-22 MED ORDER — FENTANYL CITRATE (PF) 100 MCG/2ML IJ SOLN
50.0000 ug | INTRAMUSCULAR | Status: DC | PRN
  Administered 2015-07-22: 100 ug via INTRAVENOUS

## 2015-07-22 MED ORDER — PROPOFOL 10 MG/ML IV BOLUS
INTRAVENOUS | Status: DC | PRN
  Administered 2015-07-22: 40 mg via INTRAVENOUS
  Administered 2015-07-22: 200 mg via INTRAVENOUS

## 2015-07-22 MED ORDER — PROPOFOL 10 MG/ML IV BOLUS
INTRAVENOUS | Status: AC
  Filled 2015-07-22: qty 20

## 2015-07-22 SURGICAL SUPPLY — 36 items
ADH SKN CLS APL DERMABOND .7 (GAUZE/BANDAGES/DRESSINGS) ×1
BLADE HEX COATED 2.75 (ELECTRODE) ×3 IMPLANT
BLADE SURG 15 STRL LF DISP TIS (BLADE) ×1 IMPLANT
BLADE SURG 15 STRL SS (BLADE) ×3
CANISTER SUCT 1200ML W/VALVE (MISCELLANEOUS) IMPLANT
CHLORAPREP W/TINT 26ML (MISCELLANEOUS) ×3 IMPLANT
COVER BACK TABLE 60X90IN (DRAPES) ×3 IMPLANT
COVER MAYO STAND STRL (DRAPES) ×3 IMPLANT
DECANTER SPIKE VIAL GLASS SM (MISCELLANEOUS) IMPLANT
DERMABOND ADVANCED (GAUZE/BANDAGES/DRESSINGS) ×2
DERMABOND ADVANCED .7 DNX12 (GAUZE/BANDAGES/DRESSINGS) IMPLANT
DRAPE LAPAROTOMY 100X72 PEDS (DRAPES) ×3 IMPLANT
DRAPE UTILITY XL STRL (DRAPES) ×3 IMPLANT
ELECT REM PT RETURN 9FT ADLT (ELECTROSURGICAL) ×3
ELECTRODE REM PT RTRN 9FT ADLT (ELECTROSURGICAL) ×1 IMPLANT
GLOVE BIO SURGEON STRL SZ 6 (GLOVE) ×3 IMPLANT
GLOVE BIOGEL PI IND STRL 6.5 (GLOVE) ×1 IMPLANT
GLOVE BIOGEL PI INDICATOR 6.5 (GLOVE) ×2
GOWN STRL REUS W/ TWL LRG LVL3 (GOWN DISPOSABLE) ×1 IMPLANT
GOWN STRL REUS W/TWL 2XL LVL3 (GOWN DISPOSABLE) ×3 IMPLANT
GOWN STRL REUS W/TWL LRG LVL3 (GOWN DISPOSABLE) ×3
LIQUID BAND (GAUZE/BANDAGES/DRESSINGS) ×3 IMPLANT
NDL HYPO 25X1 1.5 SAFETY (NEEDLE) ×1 IMPLANT
NEEDLE HYPO 25X1 1.5 SAFETY (NEEDLE) ×3 IMPLANT
NS IRRIG 1000ML POUR BTL (IV SOLUTION) IMPLANT
PACK BASIN DAY SURGERY FS (CUSTOM PROCEDURE TRAY) ×3 IMPLANT
PENCIL BUTTON HOLSTER BLD 10FT (ELECTRODE) ×3 IMPLANT
SUT MNCRL AB 4-0 PS2 18 (SUTURE) ×3 IMPLANT
SUT VIC AB 3-0 SH 27 (SUTURE) ×3
SUT VIC AB 3-0 SH 27X BRD (SUTURE) ×1 IMPLANT
SYR CONTROL 10ML LL (SYRINGE) ×3 IMPLANT
TOWEL OR 17X24 6PK STRL BLUE (TOWEL DISPOSABLE) ×3 IMPLANT
TOWEL OR NON WOVEN STRL DISP B (DISPOSABLE) ×3 IMPLANT
TUBE CONNECTING 20'X1/4 (TUBING)
TUBE CONNECTING 20X1/4 (TUBING) IMPLANT
YANKAUER SUCT BULB TIP NO VENT (SUCTIONS) IMPLANT

## 2015-07-22 NOTE — Anesthesia Preprocedure Evaluation (Addendum)
Anesthesia Evaluation  Patient identified by MRN, date of birth, ID band Patient awake    Reviewed: Allergy & Precautions, NPO status , Patient's Chart, lab work & pertinent test results  Airway Mallampati: I  TM Distance: >3 FB Neck ROM: Full    Dental  (+) Teeth Intact, Dental Advisory Given   Pulmonary Current Smoker,    breath sounds clear to auscultation       Cardiovascular negative cardio ROS   Rhythm:Regular Rate:Normal     Neuro/Psych  Headaches, Depression    GI/Hepatic negative GI ROS, Neg liver ROS,   Endo/Other  neg diabetes  Renal/GU negative Renal ROS  negative genitourinary   Musculoskeletal  (+) Arthritis ,   Abdominal   Peds negative pediatric ROS (+)  Hematology   Anesthesia Other Findings   Reproductive/Obstetrics negative OB ROS                            Lab Results  Component Value Date   WBC 8.0 04/29/2015   HGB 12.0 04/29/2015   HCT 36.3 04/29/2015   MCV 88.1 04/29/2015   PLT 316 04/29/2015   Lab Results  Component Value Date   CREATININE 0.48 04/29/2015   BUN 12 04/29/2015   NA 140 04/29/2015   K 3.5 04/29/2015   CL 104 04/29/2015   CO2 25 04/29/2015   No results found for: INR, PROTIME   Anesthesia Physical Anesthesia Plan  ASA: II  Anesthesia Plan: General   Post-op Pain Management:    Induction: Intravenous  Airway Management Planned: LMA  Additional Equipment:   Intra-op Plan:   Post-operative Plan: Extubation in OR  Informed Consent: I have reviewed the patients History and Physical, chart, labs and discussed the procedure including the risks, benefits and alternatives for the proposed anesthesia with the patient or authorized representative who has indicated his/her understanding and acceptance.   Dental advisory given  Plan Discussed with: CRNA, Anesthesiologist and Surgeon  Anesthesia Plan Comments:        Anesthesia  Quick Evaluation

## 2015-07-22 NOTE — Anesthesia Procedure Notes (Signed)
Procedure Name: LMA Insertion Date/Time: 07/22/2015 1:32 PM Performed by: Baxter Flattery Pre-anesthesia Checklist: Patient identified, Emergency Drugs available, Suction available, Patient being monitored and Timeout performed Patient Re-evaluated:Patient Re-evaluated prior to inductionOxygen Delivery Method: Circle System Utilized Preoxygenation: Pre-oxygenation with 100% oxygen Intubation Type: IV induction Ventilation: Mask ventilation without difficulty LMA: LMA inserted LMA Size: 4.0 Number of attempts: 1 Airway Equipment and Method: Bite block Placement Confirmation: positive ETCO2 and breath sounds checked- equal and bilateral Tube secured with: Tape Dental Injury: Teeth and Oropharynx as per pre-operative assessment

## 2015-07-22 NOTE — H&P (Signed)
Laura Matthews is an 49 y.o. female.   Chief Complaint: left breast cancer HPI:  Pt is a 48 yo F s/p left lumpectomy and ALND for IIIC breast cancer.  This was ER/PR positive, Her2 negative.  She received adjuvant chemotherapy.  She had very severe left breast lymphedema that has had to be treated with manual lymph drainage and flexitouch assist.  She is done with chemo and is ready for port removal.    Past Medical History  Diagnosis Date  . Mental disorder     depression-  . Arthritis   . Hot flashes   . Head ache   . Family history of malignant neoplasm of breast   . Depression     pt denies   . Anemia     low iron  . Newly diagnosed diabetes 11/12/2014    chemo increasing blood glucose; not on meds now  . Malignant neoplasm of breast (female), unspecified site     left breast  . Lymphedema of breast     left    Past Surgical History  Procedure Laterality Date  . Knee surgery  1995    right  . Foot surgery  2011    rt  . Tubal ligation    . Uterine fibroid surgery  2005    ablation  . Breast lumpectomy with sentinel lymph node biopsy Left 06/11/2014    Procedure: BREAST LUMPECTOMY WITH SENTINEL LYMPH NODE BX, POSSIBLE AXILLARY LYMPH NODE DISSECTION;  Surgeon: Stark Klein, MD;  Location: [place];  Service: General;  Laterality: Left;  . Portacath placement N/A 07/02/2014    Procedure: INSERTION PORT-A-CATH;  Surgeon: Stark Klein, MD;  Location: [place];  Service: General;  Laterality: N/A;  . Incision and drainage Left 07/02/2014    Procedure: Drainage left breast seroma;  Surgeon: Stark Klein, MD;  Location: [place];  Service: General;  Laterality: Left;  . Evacuation breast hematoma Left 07/10/2014    Procedure: EVACUATION OF LEFT BREAST HEMATOMA ;  Surgeon: Stark Klein, MD;  Location: [place];  Service: General;  Laterality: Left;  . Incision and drainage abscess N/A 11/12/2014    Procedure: ASPIRATION AND INCISION AND  DRAINAGE LEFT BREAST ABSCESS;  Surgeon: Michael Boston, MD;  Location: [facility];  Service: General;  Laterality: N/A;    Family History  Problem Relation Age of Onset  . Breast cancer Mother 49    currently 40; TAH/BSO d/t ?cervical ca at 43  . Breast cancer Maternal Aunt     dx 54s; currently in her late 44s  . Lung cancer Father     deceased 85  . Diabetes Neg Hx    Social History:  reports that she has been smoking Cigarettes.  She has a 5 pack-year smoking history. She has never used smokeless tobacco. She reports that she uses illicit drugs (Oxycodone and Other-see comments). She reports that she does not drink alcohol.  Allergies:  Allergies  Allergen Reactions  . Hydrocodone Itching and Nausea Only    Medications Prior to Admission  Medication Sig Dispense Refill  . gabapentin (NEURONTIN) 300 MG capsule Take 1 capsule (300 mg total) by mouth at bedtime. 90 capsule 4  . Oxycodone HCl 20 MG TABS Take 1 tablet by mouth 2 (two) times daily. Takes for lymphedema in Lt arm    . tamoxifen (NOLVADEX) 20 MG tablet Take 1 tablet (20 mg total) by mouth daily. 90 tablet 12    Results  for orders placed or performed during the hospital encounter of 07/22/15 (from the past 48 hour(s))  Glucose, capillary     Status: Abnormal   Collection Time: 07/22/15 12:25 PM  Result Value Ref Range   Glucose-Capillary 205 (H) 65 - 99 mg/dL   No results found.  Review of Systems  All other systems reviewed and are negative.   Blood pressure 141/96, pulse 88, temperature 98.3 F (36.8 C), temperature source Oral, resp. rate 16, height _0  (1.778 m), weight 79.153 kg (174 lb 8 oz), SpO2 99 %. Physical Exam  Constitutional: She is oriented to person, place, and time. She appears well-developed and well-nourished. No distress.  HENT:  Head: Normocephalic and atraumatic.  Eyes: Pupils are equal, round, and reactive to light. No scleral icterus.  Neck: Neck supple.  Cardiovascular: Normal rate.    Respiratory: Effort normal. No respiratory distress.  Neurological: She is alert and oriented to person, place, and time.  Skin: Skin is warm and dry. She is not diaphoretic.  Psychiatric: She has a normal mood and affect. Her behavior is normal. Judgment and thought content normal.     Assessment/Plan Left breast cancer, completed therapy.  Will remove port. Reviewed risks/benefits as well as post op recovery and restrictions.  She wishes to proceed.    BYERLY,FAERA 07/22/2015, 1:09 PM

## 2015-07-22 NOTE — Anesthesia Postprocedure Evaluation (Signed)
  Anesthesia Post-op Note  Patient: Laura Matthews  Procedure(s) Performed: Procedure(s): REMOVAL PORT-A-CATH (Left)  Patient Location: PACU  Anesthesia Type: General   Level of Consciousness: awake, alert  and oriented  Airway and Oxygen Therapy: Patient Spontanous Breathing  Post-op Pain: mild  Post-op Assessment: Post-op Vital signs reviewed  Post-op Vital Signs: Reviewed  Last Vitals:  Filed Vitals:   07/22/15 1445  BP: 151/87  Pulse: 74  Temp:   Resp: 11    Complications: No apparent anesthesia complications

## 2015-07-22 NOTE — Transfer of Care (Signed)
Immediate Anesthesia Transfer of Care Note  Patient: Laura Matthews  Procedure(s) Performed: Procedure(s): REMOVAL PORT-A-CATH (Left)  Patient Location: PACU  Anesthesia Type:General  Level of Consciousness: awake, alert  and oriented  Airway & Oxygen Therapy: Patient Spontanous Breathing and Patient connected to face mask oxygen  Post-op Assessment: Report given to RN, Post -op Vital signs reviewed and stable and Patient moving all extremities  Post vital signs: Reviewed and stable  Last Vitals:  Filed Vitals:   07/22/15 1409  BP:   Pulse: 84  Temp:   Resp: 11    Complications: No apparent anesthesia complications

## 2015-07-22 NOTE — Discharge Instructions (Addendum)
[place] Office Phone Number [phone]   POST OP INSTRUCTIONS  Always review your discharge instruction sheet given to you by the facility where your surgery was performed.  IF YOU HAVE DISABILITY OR FAMILY LEAVE FORMS, YOU MUST BRING THEM TO THE OFFICE FOR PROCESSING.  DO NOT GIVE THEM TO YOUR DOCTOR.  1. A prescription for pain medication may be given to you upon discharge.  Take your pain medication as prescribed, if needed.  If narcotic pain medicine is not needed, then you may take acetaminophen (Tylenol) or ibuprofen (Advil) as needed. 2. Take your usually prescribed medications unless otherwise directed 3. If you need a refill on your pain medication, please contact your pharmacy.  They will contact our office to request authorization.  Prescriptions will not be filled after 5pm or on week-ends. 4. You should eat very light the first 24 hours after surgery, such as soup, crackers, pudding, etc.  Resume your normal diet the day after surgery 5. It is common to experience some constipation if taking pain medication after surgery.  Increasing fluid intake and taking a stool softener will usually help or prevent this problem from occurring.  A mild laxative (Milk of Magnesia or Miralax) should be taken according to package directions if there are no bowel movements after 48 hours. 6. You may shower in 48 hours.  The surgical glue will flake off in 2-3 weeks.   7. ACTIVITIES:  No strenuous activity or heavy lifting for 1 week.   a. You may drive when you no longer are taking prescription pain medication, you can comfortably wear a seatbelt, and you can safely maneuver your car and apply brakes. b. RETURN TO WORK:  __________n/a_______________ Dennis Bast should see your doctor in the office for a follow-up appointment approximately three-four weeks after your surgery.    WHEN TO CALL YOUR DOCTOR: 1. Fever over 101.0 2. Nausea and/or vomiting. 3. Extreme swelling or  bruising. 4. Continued bleeding from incision. 5. Increased pain, redness, or drainage from the incision.  The clinic staff is available to answer your questions during regular business hours.  Please dont hesitate to call and ask to speak to one of the nurses for clinical concerns.  If you have a medical emergency, go to the nearest emergency room or call 911.  A surgeon from [facility] is always on call at the hospital.  For further questions, please visit [email or web address]     Post Anesthesia Home Care Instructions  Activity: Get plenty of rest for the remainder of the day. A responsible adult should stay with you for 24 hours following the procedure.  For the next 24 hours, DO NOT: -Drive a car -Paediatric nurse -Drink alcoholic beverages -Take any medication unless instructed by your physician -Make any legal decisions or sign important papers.  Meals: Start with liquid foods such as gelatin or soup. Progress to regular foods as tolerated. Avoid greasy, spicy, heavy foods. If nausea and/or vomiting occur, drink only clear liquids until the nausea and/or vomiting subsides. Call your physician if vomiting continues.  Special Instructions/Symptoms: Your throat may feel dry or sore from the anesthesia or the breathing tube placed in your throat during surgery. If this causes discomfort, gargle with warm salt water. The discomfort should disappear within 24 hours.  If you had a scopolamine patch placed behind your ear for the management of post- operative nausea and/or vomiting:  1. The medication in the patch is effective for 72 hours, after which it should  be removed.  Wrap patch in a tissue and discard in the trash. Wash hands thoroughly with soap and water. 2. You may remove the patch earlier than 72 hours if you experience unpleasant side effects which may include dry mouth, dizziness or visual disturbances. 3. Avoid touching the patch. Wash your hands  with soap and water after contact with the patch.

## 2015-07-22 NOTE — Op Note (Signed)
  PRE-OPERATIVE DIAGNOSIS:  un-needed Port-A-Cath for left breast cancer  POST-OPERATIVE DIAGNOSIS:  Same   PROCEDURE:  Procedure(s):  REMOVAL PORT-A-CATH  SURGEON:  Surgeon(s):  Stark Klein, MD  ANESTHESIA:   General + local  EBL:   Minimal  SPECIMEN:  None  Complications : none known  Procedure:   Pt was  identified in the holding area and taken to the operating room where she was placed supine on the operating room table.  General anesthesia was induced via LMA.  The left upper chest was prepped and draped.  The prior incision was anesthetized with local anesthetic.  The incision was opened with a #15 blade.  The subcutaneous tissue was divided with the cautery.  The port was identified and the capsule opened.  The four 2-0 prolene sutures were removed.  The port was then removed and pressure held on the tract.  The catheter appeared intact without evidence of breakage.  The wound was inspected for hemostasis, which was achieved with cautery.  The wound was closed with 3-0 vicryl deep dermal interrupted sutures and 4-0 Monocryl running subcuticular suture.  The wound was cleaned, dried, and dressed with dermabond.  The patient was awakened from anesthesia and taken to the PACU in stable condition.  Needle, sponge, and instrument counts are correct.

## 2015-07-23 ENCOUNTER — Encounter (HOSPITAL_BASED_OUTPATIENT_CLINIC_OR_DEPARTMENT_OTHER): Payer: Self-pay | Admitting: General Surgery

## 2015-07-30 ENCOUNTER — Ambulatory Visit
Admission: RE | Admit: 2015-07-30 | Payer: No Typology Code available for payment source | Source: Ambulatory Visit | Admitting: Radiation Oncology

## 2015-09-22 ENCOUNTER — Ambulatory Visit: Payer: Self-pay | Admitting: Oncology

## 2015-09-22 ENCOUNTER — Other Ambulatory Visit: Payer: Self-pay

## 2016-01-06 ENCOUNTER — Ambulatory Visit
Admission: RE | Admit: 2016-01-06 | Discharge: 2016-01-06 | Disposition: A | Discharge status: Home / Self Care | Payer: Medicaid Other | Source: Ambulatory Visit | Attending: Oncology | Admitting: Oncology

## 2016-01-06 DIAGNOSIS — C50412 Malignant neoplasm of upper-outer quadrant of left female breast: Secondary | ICD-10-CM

## 2016-01-12 ENCOUNTER — Other Ambulatory Visit: Payer: Self-pay | Admitting: Radiation Oncology

## 2016-01-12 DIAGNOSIS — C50412 Malignant neoplasm of upper-outer quadrant of left female breast: Secondary | ICD-10-CM

## 2016-01-13 ENCOUNTER — Telehealth: Payer: Self-pay | Admitting: *Deleted

## 2016-01-13 ENCOUNTER — Emergency Department (HOSPITAL_COMMUNITY)
Admission: EM | Admit: 2016-01-13 | Discharge: 2016-01-13 | Disposition: A | Discharge status: Home / Self Care | Payer: Medicaid Other | Attending: Emergency Medicine | Admitting: Emergency Medicine

## 2016-01-13 ENCOUNTER — Encounter (HOSPITAL_COMMUNITY): Payer: Self-pay | Admitting: *Deleted

## 2016-01-13 DIAGNOSIS — F17210 Nicotine dependence, cigarettes, uncomplicated: Secondary | ICD-10-CM | POA: Diagnosis not present

## 2016-01-13 DIAGNOSIS — Z8739 Personal history of other diseases of the musculoskeletal system and connective tissue: Secondary | ICD-10-CM | POA: Insufficient documentation

## 2016-01-13 DIAGNOSIS — F419 Anxiety disorder, unspecified: Secondary | ICD-10-CM | POA: Diagnosis not present

## 2016-01-13 DIAGNOSIS — R55 Syncope and collapse: Secondary | ICD-10-CM | POA: Insufficient documentation

## 2016-01-13 DIAGNOSIS — E119 Type 2 diabetes mellitus without complications: Secondary | ICD-10-CM | POA: Insufficient documentation

## 2016-01-13 DIAGNOSIS — D649 Anemia, unspecified: Secondary | ICD-10-CM | POA: Insufficient documentation

## 2016-01-13 DIAGNOSIS — Z79899 Other long term (current) drug therapy: Secondary | ICD-10-CM | POA: Diagnosis not present

## 2016-01-13 DIAGNOSIS — F329 Major depressive disorder, single episode, unspecified: Secondary | ICD-10-CM | POA: Diagnosis not present

## 2016-01-13 DIAGNOSIS — Z7984 Long term (current) use of oral hypoglycemic drugs: Secondary | ICD-10-CM | POA: Diagnosis not present

## 2016-01-13 DIAGNOSIS — Z86018 Personal history of other benign neoplasm: Secondary | ICD-10-CM | POA: Insufficient documentation

## 2016-01-13 DIAGNOSIS — Z853 Personal history of malignant neoplasm of breast: Secondary | ICD-10-CM | POA: Diagnosis not present

## 2016-01-13 LAB — CBC
HEMATOCRIT: 39 % (ref 36.0–46.0)
HEMOGLOBIN: 13 g/dL (ref 12.0–15.0)
MCH: 28.6 pg (ref 26.0–34.0)
MCHC: 33.3 g/dL (ref 30.0–36.0)
MCV: 85.9 fL (ref 78.0–100.0)
PLATELETS: 319 10*3/uL (ref 150–400)
RBC: 4.54 MIL/uL (ref 3.87–5.11)
RDW: 15.1 % (ref 11.5–15.5)
WBC: 15.6 10*3/uL — AB (ref 4.0–10.5)

## 2016-01-13 LAB — BASIC METABOLIC PANEL
Anion gap: 12 (ref 5–15)
BUN: 20 mg/dL (ref 6–20)
CHLORIDE: 101 mmol/L (ref 101–111)
CO2: 27 mmol/L (ref 22–32)
CREATININE: 0.55 mg/dL (ref 0.44–1.00)
Calcium: 10 mg/dL (ref 8.9–10.3)
GFR calc non Af Amer: >60 mL/min (ref >60)
Glucose, Bld: 186 mg/dL — ABNORMAL HIGH (ref 65–99)
POTASSIUM: 4.4 mmol/L (ref 3.5–5.1)
Sodium: 140 mmol/L (ref 135–145)

## 2016-01-13 LAB — CBG MONITORING, ED: Glucose-Capillary: 185 mg/dL — ABNORMAL HIGH (ref 65–99)

## 2016-01-13 MED ORDER — SODIUM CHLORIDE 0.9 % IV BOLUS (SEPSIS)
500.0000 mL | Freq: Once | INTRAVENOUS | Status: AC
  Administered 2016-01-13: 500 mL via INTRAVENOUS

## 2016-01-13 NOTE — ED Provider Notes (Signed)
CSN: [id number]     Arrival date & time 01/13/16  1639 History   First MD Initiated Contact with Patient 01/13/16 1728     Chief Complaint  Patient presents with  . Loss of Consciousness     (Consider location/radiation/quality/duration/timing/severity/associated sxs/prior Treatment) HPI   Laura Matthews is a 50 y.o. female brought in today for evaluation of an episode of syncope. Patient was walking in her kitchen, when she felt dizzy, and apparently fell. Her son found her unconscious on the floor. It is felt that this was a very brief event. Patient remembers being groggy when she awoke but was able to get up, and walk and had no injuries from the fall. She has not had previous episodes of syncope. She states that she has been under a lot of stress recently because of the possibility of breast cancer returning. She has been through chemotherapy and radiation treatment, finishing about 5 months ago. She had a follow-up mammogram last week that showed some areas of concern, which will be evaluated with repeat imaging, in several months time. She is being followed at the [facility]. She denies fever, chills, nausea, vomiting, cough, shortness of breath or chest pain. There is no evident shaking seen prior to the event, or prolonged postictal phase. There are no other known modifying factors.   Past Medical History  Diagnosis Date  . Mental disorder     depression-  . Arthritis   . Hot flashes   . Head ache   . Family history of malignant neoplasm of breast   . Depression     pt denies   . Anemia     low iron  . Newly diagnosed diabetes 11/12/2014    chemo increasing blood glucose; not on meds now  . Malignant neoplasm of breast (female), unspecified site     left breast  . Lymphedema of breast     left  . Radiation 04/22/15-06/23/15    Left Breast   Past Surgical History  Procedure Laterality Date  . Knee surgery  1995    right  . Foot surgery  2011    rt  .  Tubal ligation    . Uterine fibroid surgery  2005    ablation  . Breast lumpectomy with sentinel lymph node biopsy Left 06/11/2014    Procedure: BREAST LUMPECTOMY WITH SENTINEL LYMPH NODE BX, POSSIBLE AXILLARY LYMPH NODE DISSECTION;  Surgeon: Stark Klein, MD;  Location: [place];  Service: General;  Laterality: Left;  . Portacath placement N/A 07/02/2014    Procedure: INSERTION PORT-A-CATH;  Surgeon: Stark Klein, MD;  Location: [place];  Service: General;  Laterality: N/A;  . Incision and drainage Left 07/02/2014    Procedure: Drainage left breast seroma;  Surgeon: Stark Klein, MD;  Location: [place];  Service: General;  Laterality: Left;  . Evacuation breast hematoma Left 07/10/2014    Procedure: EVACUATION OF LEFT BREAST HEMATOMA ;  Surgeon: Stark Klein, MD;  Location: [place];  Service: General;  Laterality: Left;  . Incision and drainage abscess N/A 11/12/2014    Procedure: ASPIRATION AND INCISION AND DRAINAGE LEFT BREAST ABSCESS;  Surgeon: Michael Boston, MD;  Location: [facility];  Service: General;  Laterality: N/A;  . Port-a-cath removal Left 07/22/2015    Procedure: REMOVAL PORT-A-CATH;  Surgeon: Stark Klein, MD;  Location: [place];  Service: General;  Laterality: Left;   Family History  Problem Relation Age of Onset  .  Breast cancer Mother 1    currently 42; TAH/BSO d/t ?cervical ca at 22  . Breast cancer Maternal Aunt     dx 75s; currently in her late 70s  . Lung cancer Father     deceased 78  . Diabetes Neg Hx    Social History  Substance Use Topics  . Smoking status: Current Every Day Smoker -- 0.25 packs/day for 20 years    Types: Cigarettes  . Smokeless tobacco: Never Used  . Alcohol Use: No     Comment: Percoset,Vicodin, Oxycotin   OB History    Gravida Para Term Preterm AB TAB SAB Ectopic Multiple Living   '3 3 3       3     '$ Review of Systems  All other systems reviewed and are  negative.     Allergies  Hydrocodone  Home Medications   Prior to Admission medications   Medication Sig Start Date End Date Taking? Authorizing Provider  ALPRAZolam Duanne Moron) 1 MG tablet Take 1 tablet by mouth three times a day as needed for anxiety 12/28/15  Yes Historical Provider, MD  gabapentin (NEURONTIN) 300 MG capsule Take 1 capsule (300 mg total) by mouth at bedtime. 07/01/15  Yes Chauncey Cruel, MD  glipiZIDE (GLUCOTROL XL) 5 MG 24 hr tablet TAKE ONE TABLET BY MOUTH TWICE DAILY 11/24/15  Yes Historical Provider, MD  oxycodone (ROXICODONE) 30 MG immediate release tablet Take 1 tablet by mouth four times a day as needed May take additional tablet daily if needed for severe pain. 12/28/15  Yes Historical Provider, MD  promethazine (PHENERGAN) 25 MG tablet TAKE ONE TABLET BY MOUTH FOUR TIMES DAILY AS NEEDED FOR NAUSEA 11/24/15  Yes Historical Provider, MD  tamoxifen (NOLVADEX) 20 MG tablet Take 20 mg by mouth daily. 11/24/15  Yes Historical Provider, MD  oxyCODONE (OXY IR/ROXICODONE) 5 MG immediate release tablet Take 1-2 tablets (5-10 mg total) by mouth every 6 (six) hours as needed for moderate pain, severe pain or breakthrough pain. Patient not taking: Reported on 01/13/2016 07/22/15   Stark Klein, MD   BP 143/86 mmHg  Pulse 111  Temp(Src) 97.6 F (36.4 C) (Oral)  Resp 23  SpO2 92% Physical Exam  Constitutional: She is oriented to person, place, and time. She appears well-developed and well-nourished. No distress.  HENT:  Head: Normocephalic and atraumatic.  Right Ear: External ear normal.  Left Ear: External ear normal.  No injuries to head or scalp.  Eyes: Conjunctivae and EOM are normal. Pupils are equal, round, and reactive to light.  Neck: Normal range of motion and phonation normal. Neck supple.  Cardiovascular: Normal rate, regular rhythm and normal heart sounds.   Pulmonary/Chest: Effort normal and breath sounds normal. She exhibits no bony tenderness.  Abdominal:  Soft. There is no tenderness.  Musculoskeletal: Normal range of motion.  Neurological: She is alert and oriented to person, place, and time. No cranial nerve deficit or sensory deficit. She exhibits normal muscle tone. Coordination normal.  No dysarthria and aphasia or nystagmus.  Skin: Skin is warm, dry and intact.  Psychiatric: Her behavior is normal. Judgment and thought content normal.  Tearful, when discussing history of breast cancer.  Nursing note and vitals reviewed.   ED Course  Procedures (including critical care time)  Medications  sodium chloride 0.9 % bolus 500 mL (500 mLs Intravenous New Bag/Given 01/13/16 1812)    Patient Vitals for the past 24 hrs:  BP Temp Temp src Pulse Resp SpO2  01/13/16 1900  143/86 mmHg - - - 23 -  01/13/16 1654 131/78 mmHg 97.6 F (36.4 C) Oral 111 20 92 %    8:11 PM Reevaluation with update and discussion. After initial assessment and treatment, an updated evaluation reveals She is comfortable. She has been able to tolerate oral liquids and ambulate easily. Findings discussed with the patient, all questions answered. Taj Nevins L     Labs Review Labs Reviewed  BASIC METABOLIC PANEL - Abnormal; Notable for the following:    Glucose, Bld 186 (*)    All other components within normal limits  CBC - Abnormal; Notable for the following:    WBC 15.6 (*)    All other components within normal limits  CBG MONITORING, ED - Abnormal; Notable for the following:    Glucose-Capillary 185 (*)    All other components within normal limits  URINALYSIS, ROUTINE W REFLEX MICROSCOPIC (NOT AT [facility])    Imaging Review No results found. I have personally reviewed and evaluated these images and lab results as part of my medical decision-making.   EKG Interpretation   Date/Time:  Wednesday January 13 2016 16:52:51 EDT Ventricular Rate:  111 PR Interval:  126 QRS Duration: 91 QT Interval:  344 QTC Calculation: 467 R Axis:   68 Text Interpretation:   Sinus tachycardia Probable left atrial enlargement  since last tracing no significant change Confirmed by Eulis Foster  MD, Vira Agar  ([id number]) on 01/13/2016 6:46:52 PM           MDM   Final diagnoses:  Syncope, unspecified syncope type  Anxiety    Syncope without clear cause. No injuries. Underlying anxiety related to concern of her breast cancer which is not currently active. Is under supervised care. Doubt ACS, PE or pneumonia.  Nursing Notes Reviewed/ Care Coordinated Applicable Imaging Reviewed Interpretation of Laboratory Data incorporated into ED treatment  The patient appears reasonably screened and/or stabilized for discharge and I doubt any other medical condition or other [facility] requiring further screening, evaluation, or treatment in the ED at this time prior to discharge.  Plan: Home Medications- usual; Home Treatments- rest; return here if the recommended treatment, does not improve the symptoms; Recommended follow up- PCP prn     Daleen Bo, MD 01/13/16 2012

## 2016-01-13 NOTE — ED Notes (Signed)
Bed: YI01 Expected date:  Expected time:  Means of arrival:  Comments: Ems-syncope, breast ca

## 2016-01-13 NOTE — Discharge Instructions (Signed)
Get plenty of rest and drink a lot of fluids. Consider seeing a therapist to discuss stress, stress management and coping skills.   Syncope Syncope is a medical term for fainting or passing out. This means you lose consciousness and drop to the ground. People are generally unconscious for less than 5 minutes. You may have some muscle twitches for up to 15 seconds before waking up and returning to normal. Syncope occurs more often in older adults, but it can happen to anyone. While most causes of syncope are not dangerous, syncope can be a sign of a serious medical problem. It is important to seek medical care.  CAUSES  Syncope is caused by a sudden drop in blood flow to the brain. The specific cause is often not determined. Factors that can bring on syncope include:  Taking medicines that lower blood pressure.  Sudden changes in posture, such as standing up quickly.  Taking more medicine than prescribed.  Standing in one place for too long.  Seizure disorders.  Dehydration and excessive exposure to heat.  Low blood sugar (hypoglycemia).  Straining to have a bowel movement.  Heart disease, irregular heartbeat, or other circulatory problems.  Fear, emotional distress, seeing blood, or severe pain. SYMPTOMS  Right before fainting, you may:  Feel dizzy or light-headed.  Feel nauseous.  See all white or all black in your field of vision.  Have cold, clammy skin. DIAGNOSIS  Your health care provider will ask about your symptoms, perform a physical exam, and perform an electrocardiogram (ECG) to record the electrical activity of your heart. Your health care provider may also perform other heart or blood tests to determine the cause of your syncope which may include:  Transthoracic echocardiogram (TTE). During echocardiography, sound waves are used to evaluate how blood flows through your heart.  Transesophageal echocardiogram (TEE).  Cardiac monitoring. This allows your health  care provider to monitor your heart rate and rhythm in real time.  Holter monitor. This is a portable device that records your heartbeat and can help diagnose heart arrhythmias. It allows your health care provider to track your heart activity for several days, if needed.  Stress tests by exercise or by giving medicine that makes the heart beat faster. TREATMENT  In most cases, no treatment is needed. Depending on the cause of your syncope, your health care provider may recommend changing or stopping some of your medicines. HOME CARE INSTRUCTIONS  Have someone stay with you until you feel stable.  Do not drive, use machinery, or play sports until your health care provider says it is okay.  Keep all follow-up appointments as directed by your health care provider.  Lie down right away if you start feeling like you might faint. Breathe deeply and steadily. Wait until all the symptoms have passed.  Drink enough fluids to keep your urine clear or pale yellow.  If you are taking blood pressure or heart medicine, get up slowly and take several minutes to sit and then stand. This can reduce dizziness. SEEK IMMEDIATE MEDICAL CARE IF:   You have a severe headache.  You have unusual pain in the chest, abdomen, or back.  You are bleeding from your mouth or rectum, or you have black or tarry stool.  You have an irregular or very fast heartbeat.  You have pain with breathing.  You have repeated fainting or seizure-like jerking during an episode.  You faint when sitting or lying down.  You have confusion.  You have trouble walking.  You have severe weakness.  You have vision problems. If you fainted, call your local emergency services (911 in [place] Do not drive yourself to the hospital.    This information is not intended to replace advice given to you by your health care provider. Make sure you discuss any questions you have with your health care provider.   Document Released:  10/17/2005 Document Revised: 03/03/2015 Document Reviewed: 12/16/2011 Elsevier Interactive Patient Education [employer].

## 2016-01-13 NOTE — ED Notes (Addendum)
Patient was found on the floor by her son. She was unresponsive and responded to painful stimuli. Patient states she took her melatonin today but it is unclear exactly when and how much she took. Patient was hypertensive with sbp > 200. With no history. Patient is currently in remission for breast cancer, but it may be out of remission according to recent mammogram. Patient is under a lot stress at this time. Patient has not no memory of what happened right before the episode.

## 2016-01-13 NOTE — Telephone Encounter (Signed)
Called unable to leave a message voicemail full will try again tomorrow to let her know that she has a 6 month mammogram that has been ordered by Dr. Pablo Ledger.

## 2016-01-14 ENCOUNTER — Telehealth: Payer: Self-pay | Admitting: *Deleted

## 2016-01-14 NOTE — Telephone Encounter (Signed)
Spoke with Ms. Branden had her mammogram on 01-06-16.

## 2016-01-15 NOTE — Telephone Encounter (Signed)
She had her mammogram on 3/8. At that time they recommended she come back in 6 months. That order is in.  Please let her know she needs a mammogram in 6 months.  SW

## 2016-01-15 NOTE — Telephone Encounter (Signed)
Spoke with Ms. Laura Matthews I let her know was calling as Dr. Unknown Jim nurse Jarrett Ables let her know that she needs to have another mammogram in 6 months and the order has been requested by Dr. Pablo Ledger

## 2016-05-19 ENCOUNTER — Emergency Department (HOSPITAL_COMMUNITY): Payer: Medicaid Other

## 2016-05-19 ENCOUNTER — Emergency Department (HOSPITAL_COMMUNITY)
Admission: EM | Admit: 2016-05-19 | Discharge: 2016-05-20 | Disposition: A | Discharge status: Home / Self Care | Payer: Medicaid Other | Attending: Emergency Medicine | Admitting: Emergency Medicine

## 2016-05-19 ENCOUNTER — Encounter (HOSPITAL_COMMUNITY): Payer: Self-pay | Admitting: Emergency Medicine

## 2016-05-19 DIAGNOSIS — E119 Type 2 diabetes mellitus without complications: Secondary | ICD-10-CM | POA: Insufficient documentation

## 2016-05-19 DIAGNOSIS — R0789 Other chest pain: Secondary | ICD-10-CM | POA: Insufficient documentation

## 2016-05-19 DIAGNOSIS — R079 Chest pain, unspecified: Secondary | ICD-10-CM

## 2016-05-19 DIAGNOSIS — F17210 Nicotine dependence, cigarettes, uncomplicated: Secondary | ICD-10-CM | POA: Insufficient documentation

## 2016-05-19 DIAGNOSIS — R918 Other nonspecific abnormal finding of lung field: Secondary | ICD-10-CM | POA: Insufficient documentation

## 2016-05-19 DIAGNOSIS — R0602 Shortness of breath: Secondary | ICD-10-CM | POA: Insufficient documentation

## 2016-05-19 LAB — CBC WITH DIFFERENTIAL/PLATELET
Basophils Absolute: 0 10*3/uL (ref 0.0–0.1)
Basophils Relative: 0 %
EOS ABS: 0.1 10*3/uL (ref 0.0–0.7)
Eosinophils Relative: 1 %
HEMATOCRIT: 33.6 % — AB (ref 36.0–46.0)
HEMOGLOBIN: 11.3 g/dL — AB (ref 12.0–15.0)
LYMPHS ABS: 2 10*3/uL (ref 0.7–4.0)
LYMPHS PCT: 16 %
MCH: 29.2 pg (ref 26.0–34.0)
MCHC: 33.6 g/dL (ref 30.0–36.0)
MCV: 86.8 fL (ref 78.0–100.0)
MONOS PCT: 9 %
Monocytes Absolute: 1.1 10*3/uL — ABNORMAL HIGH (ref 0.1–1.0)
NEUTROS ABS: 9.4 10*3/uL — AB (ref 1.7–7.7)
NEUTROS PCT: 74 %
Platelets: 350 10*3/uL (ref 150–400)
RBC: 3.87 MIL/uL (ref 3.87–5.11)
RDW: 13.7 % (ref 11.5–15.5)
WBC: 12.6 10*3/uL — AB (ref 4.0–10.5)

## 2016-05-19 LAB — D-DIMER, QUANTITATIVE: D-Dimer, Quant: 1.41 ug/mL-FEU — ABNORMAL HIGH (ref 0.00–0.50)

## 2016-05-19 LAB — I-STAT TROPONIN, ED: Troponin i, poc: 0.02 ng/mL (ref 0.00–0.08)

## 2016-05-19 LAB — BASIC METABOLIC PANEL
Anion gap: 8 (ref 5–15)
BUN: <5 mg/dL — ABNORMAL LOW (ref 6–20)
CHLORIDE: 95 mmol/L — AB (ref 101–111)
CO2: 29 mmol/L (ref 22–32)
CREATININE: 0.59 mg/dL (ref 0.44–1.00)
Calcium: 9.6 mg/dL (ref 8.9–10.3)
GFR calc non Af Amer: >60 mL/min (ref >60)
Glucose, Bld: 172 mg/dL — ABNORMAL HIGH (ref 65–99)
POTASSIUM: 3.7 mmol/L (ref 3.5–5.1)
SODIUM: 132 mmol/L — AB (ref 135–145)

## 2016-05-19 LAB — BRAIN NATRIURETIC PEPTIDE: B NATRIURETIC PEPTIDE 5: 17.2 pg/mL (ref 0.0–100.0)

## 2016-05-19 MED ORDER — IOPAMIDOL (ISOVUE-370) INJECTION 76%
80.0000 mL | Freq: Once | INTRAVENOUS | Status: AC | PRN
  Administered 2016-05-19: 80 mL via INTRAVENOUS

## 2016-05-19 MED ORDER — HYDROMORPHONE HCL 1 MG/ML IJ SOLN
0.5000 mg | Freq: Once | INTRAMUSCULAR | Status: AC
  Administered 2016-05-19: 0.5 mg via INTRAVENOUS
  Filled 2016-05-19: qty 1

## 2016-05-19 MED ORDER — ONDANSETRON HCL 4 MG/2ML IJ SOLN
4.0000 mg | Freq: Once | INTRAMUSCULAR | Status: AC
  Administered 2016-05-19: 4 mg via INTRAVENOUS
  Filled 2016-05-19: qty 2

## 2016-05-19 NOTE — ED Provider Notes (Signed)
CSN: [id number]     Arrival date & time 05/19/16  1504 History   First MD Initiated Contact with Patient 05/19/16 2110     Chief Complaint  Patient presents with  . Cough     (Consider location/radiation/quality/duration/timing/severity/associated sxs/prior Treatment) HPI Comments: Patient complains of right rib pain. She has a history of diabetes and recent treatment for breast cancer. She completed treatments last October. She states that she's had a dry cough for about 3 weeks. She states during one of the bad coughing spells she developed severe pain in her right chest area. The pain radiates to her right mid back It's worse with coughing and worse with movement. She has associated shortness of breath particularly on lying down. She denies any leg edema. She denies any fevers. No production to the cough. No history of heart problems in the past. No calf pain. She is a current everyday smoker.  Patient is a 50 y.o. female presenting with cough.  Cough Associated symptoms: chest pain and shortness of breath   Associated symptoms: no chills, no diaphoresis, no fever, no headaches, no rash and no rhinorrhea     Past Medical History  Diagnosis Date  . Mental disorder     depression-  . Arthritis   . Hot flashes   . Head ache   . Family history of malignant neoplasm of breast   . Depression     pt denies   . Anemia     low iron  . Newly diagnosed diabetes 11/12/2014    chemo increasing blood glucose; not on meds now  . Malignant neoplasm of breast (female), unspecified site     left breast  . Lymphedema of breast     left  . Radiation 04/22/15-06/23/15    Left Breast   Past Surgical History  Procedure Laterality Date  . Knee surgery  1995    right  . Foot surgery  2011    rt  . Tubal ligation    . Uterine fibroid surgery  2005    ablation  . Breast lumpectomy with sentinel lymph node biopsy Left 06/11/2014    Procedure: BREAST LUMPECTOMY WITH SENTINEL LYMPH NODE BX,  POSSIBLE AXILLARY LYMPH NODE DISSECTION;  Surgeon: Stark Klein, MD;  Location: [facility];  Service: General;  Laterality: Left;  . Portacath placement N/A 07/02/2014    Procedure: INSERTION PORT-A-CATH;  Surgeon: Stark Klein, MD;  Location: [facility];  Service: General;  Laterality: N/A;  . Incision and drainage Left 07/02/2014    Procedure: Drainage left breast seroma;  Surgeon: Stark Klein, MD;  Location: [facility];  Service: General;  Laterality: Left;  . Evacuation breast hematoma Left 07/10/2014    Procedure: EVACUATION OF LEFT BREAST HEMATOMA ;  Surgeon: Stark Klein, MD;  Location: [facility];  Service: General;  Laterality: Left;  . Incision and drainage abscess N/A 11/12/2014    Procedure: ASPIRATION AND INCISION AND DRAINAGE LEFT BREAST ABSCESS;  Surgeon: Michael Boston, MD;  Location: [facility];  Service: General;  Laterality: N/A;  . Port-a-cath removal Left 07/22/2015    Procedure: REMOVAL PORT-A-CATH;  Surgeon: Stark Klein, MD;  Location: [place] [facility];  Service: General;  Laterality: Left;   Family History  Problem Relation Age of Onset  . Breast cancer Mother 28    currently 82; TAH/BSO d/t ?cervical ca at 82  . Breast cancer Maternal Aunt     dx 19s; currently in her late 66s  .  Lung cancer Father     deceased 52  . Diabetes Neg Hx    Social History  Substance Use Topics  . Smoking status: Current Every Day Smoker -- 0.25 packs/day for 20 years    Types: Cigarettes  . Smokeless tobacco: Never Used  . Alcohol Use: No     Comment: Percoset,Vicodin, Oxycotin   OB History    Gravida Para Term Preterm AB TAB SAB Ectopic Multiple Living   '3 3 3       3     '$ Review of Systems  Constitutional: Positive for appetite change and fatigue. Negative for fever, chills and diaphoresis.  HENT: Negative for congestion, rhinorrhea and sneezing.   Eyes: Negative.   Respiratory: Positive for cough and shortness of breath. Negative for  chest tightness.   Cardiovascular: Positive for chest pain. Negative for leg swelling.  Gastrointestinal: Positive for nausea. Negative for vomiting, abdominal pain, diarrhea and blood in stool.  Genitourinary: Negative for frequency, hematuria, flank pain and difficulty urinating.  Musculoskeletal: Negative for back pain and arthralgias.  Skin: Negative for rash.  Neurological: Negative for dizziness, speech difficulty, weakness, numbness and headaches.      Allergies  Hydrocodone  Home Medications   Prior to Admission medications   Medication Sig Start Date End Date Taking? Authorizing Provider  oxycodone (ROXICODONE) 30 MG immediate release tablet Take 1 tablet by mouth four times a day as needed May take additional tablet daily if needed for severe pain. 12/28/15  Yes Historical Provider, MD  tamoxifen (NOLVADEX) 20 MG tablet Take 20 mg by mouth daily. 11/24/15  Yes Historical Provider, MD  azithromycin (ZITHROMAX Z-PAK) 250 MG tablet 2 po day one, then 1 daily x 4 days 05/20/16   Malvin Johns, MD  gabapentin (NEURONTIN) 300 MG capsule Take 1 capsule (300 mg total) by mouth at bedtime. 07/01/15   Chauncey Cruel, MD  oxyCODONE (OXY IR/ROXICODONE) 5 MG immediate release tablet Take 1-2 tablets (5-10 mg total) by mouth every 6 (six) hours as needed for moderate pain, severe pain or breakthrough pain. Patient not taking: Reported on 01/13/2016 07/22/15   Stark Klein, MD   BP 142/73 mmHg  Pulse 97  Temp(Src) 98.8 F (37.1 C) (Oral)  Resp 15  SpO2 92% Physical Exam  Constitutional: She is oriented to person, place, and time. She appears well-developed and well-nourished.  HENT:  Head: Normocephalic and atraumatic.  Eyes: Pupils are equal, round, and reactive to light.  Neck: Normal range of motion. Neck supple.  Cardiovascular: Normal rate, regular rhythm and normal heart sounds.   Pulmonary/Chest: Effort normal and breath sounds normal. No respiratory distress. She has no wheezes.  She has no rales. She exhibits tenderness (Positive tenderness to the right mid ribs in the anterior axillary line and radiating around to the right mid back).  There is no wounds or rashes. No crepitus or deformity  Abdominal: Soft. Bowel sounds are normal. There is no tenderness. There is no rebound and no guarding.  Musculoskeletal: Normal range of motion. She exhibits no edema.  No pain along the spine  Lymphadenopathy:    She has no cervical adenopathy.  Neurological: She is alert and oriented to person, place, and time.  Skin: Skin is warm and dry. No rash noted.  Psychiatric: She has a normal mood and affect.    ED Course  Procedures (including critical care time) Labs Review Labs Reviewed  BASIC METABOLIC PANEL - Abnormal; Notable for the following:    Sodium  132 (*)    Chloride 95 (*)    Glucose, Bld 172 (*)    BUN <5 (*)    All other components within normal limits  CBC WITH DIFFERENTIAL/PLATELET - Abnormal; Notable for the following:    WBC 12.6 (*)    Hemoglobin 11.3 (*)    HCT 33.6 (*)    Neutro Abs 9.4 (*)    Monocytes Absolute 1.1 (*)    All other components within normal limits  D-DIMER, QUANTITATIVE (NOT AT [facility]) - Abnormal; Notable for the following:    D-Dimer, Quant 1.41 (*)    All other components within normal limits  BRAIN NATRIURETIC PEPTIDE  I-STAT TROPOININ, ED    Imaging Review Dg Ribs Unilateral W/chest Right  05/19/2016  CLINICAL DATA:  Cough 3 weeks with anterior right rib pain extending to back. History of left breast cancer. EXAM: RIGHT RIBS AND CHEST - 3+ VIEW COMPARISON:  09/17/2014 FINDINGS: Lungs are adequately inflated with mild prominence of the perihilar markings. No focal lobar consolidation or effusion. No pneumothorax. Cardiomediastinal silhouette is within normal. Multiple surgical clips over the left lateral chest and axilla. No definite right rib fracture. IMPRESSION: Suggestion of mild vascular congestion. No right rib fracture.  Electronically Signed   By: Marin Olp M.D.   On: 05/19/2016 15:59   Ct Angio Chest Pe W/cm &/or Wo Cm  05/20/2016  CLINICAL DATA:  Right chest pain and elevated D-dimer. EXAM: CT ANGIOGRAPHY CHEST WITH CONTRAST TECHNIQUE: Multidetector CT imaging of the chest was performed using the standard protocol during bolus administration of intravenous contrast. Multiplanar CT image reconstructions and MIPs were obtained to evaluate the vascular anatomy. CONTRAST:  80 cc Isovue 370 intravenous COMPARISON:  05/29/2014 head CT FINDINGS: Cardiovascular: Limited pulmonary artery CTA, especially beyond the segmental level. This appears secondary to transient interruption of contrast. No evidence of pulmonary embolism. Negative thoracic aorta. Normal heart size. No pericardial effusion. Mediastinum: Increased soft tissue in the right hilum without discrete measurable nodule. Lungs/Pleura: Diffuse Kerley lines consistent with mild edema. Small pleural effusion on the right without convincing nodularity. Subpleural reticulation left upper lobe is likely radiation fibrosis. There is asymmetric airway thickening on the right with thickened major and minor fissures around the right middle lobe that has nodular morphology and neighboring airspace opacity. Air trapping bilaterally. Upper abdomen: Hepatic steatosis. Mildly lobulated adrenal glands without discrete mass. Musculoskeletal: Lumpectomy site in the left breast with central fluid like density. Area measures 23 mm. Left breast is thick walled, potentially radiation related. Soft tissue nodularity, nonspecific, around left axillary clips. Patient is followed with mammography, most recently 01/06/2016. Review of the MIP images confirms the above findings. IMPRESSION: 1. Limited CTA of the pulmonary arteries. Negative for pulmonary embolism to the segmental level. 2. Mild pulmonary edema with small right pleural effusion. 3. Right-sided fissural nodularity and asymmetric hilar  soft tissue is primarily concerning for lymphangitic carcinomatosis. Atypical infection is also considered in the acute setting. 4. Left axillary dissection with unexpected soft tissue around the surgical clips. 5. Suggest PET-CT follow-up after convalescence to evaluate #3 and #4. Electronically Signed   By: Monte Fantasia M.D.   On: 05/20/2016 00:03   I have personally reviewed and evaluated these images and lab results as part of my medical decision-making.   EKG Interpretation   Date/Time:  Thursday May 19 2016 21:24:02 EDT Ventricular Rate:  89 PR Interval:    QRS Duration: 88 QT Interval:  377 QTC Calculation: 459 R Axis:  70 Text Interpretation:  Sinus rhythm Probable left atrial enlargement Low  voltage, precordial leads Borderline T abnormalities, anterior leads  similar to prior EKG Confirmed by Tristin Vandeusen  MD, Eleena Grater ([id number]) on 05/19/2016  9:34:58 PM      MDM   Final diagnoses:  Chest pain, unspecified chest pain type  Lung mass    Patient presents with right-sided chest pain. It seems any musculoskeletal in nature. There is some suggestion of pulmonary edema. Her BNP is normal. She has no hypoxia. Her pain is worse with moving and breathing and reproducible on palpation. Her d-dimer is elevated and CT chest was done. There is no evidence of pulmonary embolus. However there is some question of an atypical infection. Given the cough, I will go ahead and treat her with antibiotics. She was started on Zithromax. There is also some abnormal findings on the CT scan that warrant follow-up PET scan. I discussed these findings with the patient. She is going to call the cancer center tomorrow to arrange this. Her d-dimer was mildly elevated. She doesn't have any leg edema which would be more concerning for DVT. She has oxycodone to take at home. Her pain is markedly improved after pain medication in the ED. She was advised to return if she has any worsening symptoms.    Malvin Johns,  MD 05/20/16 [phone]

## 2016-05-19 NOTE — ED Notes (Signed)
Pt sts right sided rib pain and cough x 3 weeks; pt sts hx of breast CA

## 2016-05-20 ENCOUNTER — Telehealth: Payer: Self-pay | Admitting: *Deleted

## 2016-05-20 MED ORDER — HYDROMORPHONE HCL 1 MG/ML IJ SOLN
0.5000 mg | Freq: Once | INTRAMUSCULAR | Status: AC
  Administered 2016-05-20: 0.5 mg via INTRAVENOUS
  Filled 2016-05-20: qty 1

## 2016-05-20 MED ORDER — AZITHROMYCIN 250 MG PO TABS: ORAL_TABLET | ORAL | Status: DC

## 2016-05-20 NOTE — ED Notes (Signed)
Pt verbalized understanding of follow-up care. Ambulatory to the lobby at discharge.

## 2016-05-20 NOTE — Discharge Instructions (Signed)

## 2016-05-20 NOTE — Telephone Encounter (Signed)
Voicemail "I'm referred by the ED.  Mass found on lung.  I was seen there previously.  Please call me back to schedule an appointment [phone]."   Last [facility] visit was [phone].  [facility] 09-22-2015.  Last Mammogram 01-06-2016.

## 2016-05-24 ENCOUNTER — Other Ambulatory Visit: Payer: Self-pay | Admitting: Oncology

## 2016-05-24 ENCOUNTER — Telehealth: Payer: Self-pay | Admitting: Oncology

## 2016-05-24 ENCOUNTER — Telehealth: Payer: Self-pay | Admitting: *Deleted

## 2016-05-24 ENCOUNTER — Other Ambulatory Visit: Payer: Self-pay | Admitting: *Deleted

## 2016-05-24 DIAGNOSIS — C50412 Malignant neoplasm of upper-outer quadrant of left female breast: Secondary | ICD-10-CM

## 2016-05-24 DIAGNOSIS — C773 Secondary and unspecified malignant neoplasm of axilla and upper limb lymph nodes: Principal | ICD-10-CM

## 2016-05-24 DIAGNOSIS — C50912 Malignant neoplasm of unspecified site of left female breast: Secondary | ICD-10-CM

## 2016-05-24 NOTE — Telephone Encounter (Signed)
This RN spoke with pt per her call stating " I was called by your office and was told Dr [name] could not see me until the 33 th - but I am still having issues since I was in the ER"  Note pt was seen in the ER last week and per work up for respiratory issues had CT of chest- she was given a z pak and informed to follow up with this office.  [place] states she is having a continued cough and SOB - she is concerned that she may need further antibiotics and better cough medications then the OTC medications she is using presently.  Per above review with MD - recommendation is for pt to be seen in [facility] for her acute issues and he will follow up with further concerns at scheduled appoitment 8/11.  Above discussed with pt who stated understanding and appreciation. Scheduling request entered for appointment.

## 2016-05-24 NOTE — Telephone Encounter (Signed)
Spoke with pt to confirm 8/11 appts and labs a few days prior per GM oders

## 2016-05-25 ENCOUNTER — Other Ambulatory Visit: Payer: Self-pay | Admitting: Nurse Practitioner

## 2016-05-25 ENCOUNTER — Encounter: Payer: Self-pay | Admitting: Nurse Practitioner

## 2016-05-25 ENCOUNTER — Ambulatory Visit (HOSPITAL_BASED_OUTPATIENT_CLINIC_OR_DEPARTMENT_OTHER): Payer: Medicaid Other | Admitting: Nurse Practitioner

## 2016-05-25 VITALS — BP 142/84 | HR 76 | Temp 98.3°F | Resp 18 | Ht 70.0 in | Wt 153.8 lb

## 2016-05-25 DIAGNOSIS — C50412 Malignant neoplasm of upper-outer quadrant of left female breast: Secondary | ICD-10-CM

## 2016-05-25 DIAGNOSIS — J40 Bronchitis, not specified as acute or chronic: Secondary | ICD-10-CM | POA: Diagnosis not present

## 2016-05-25 MED ORDER — LEVOFLOXACIN 500 MG PO TABS: 500.0000 mg | ORAL_TABLET | Freq: Every day | ORAL | 0 refills | Status: DC

## 2016-05-25 MED ORDER — ALBUTEROL SULFATE HFA 108 (90 BASE) MCG/ACT IN AERS: 1.0000 | INHALATION_SPRAY | Freq: Four times a day (QID) | RESPIRATORY_TRACT | 2 refills | Status: DC | PRN

## 2016-05-25 MED ORDER — OXYCODONE-ACETAMINOPHEN 10-325 MG PO TABS: ORAL_TABLET | ORAL | 0 refills | Status: DC

## 2016-05-25 NOTE — Assessment & Plan Note (Signed)
Patient continues to take tamoxifen oral therapy as directed.  She recently went to the emergency department for complaint of URI/bronchitis symptoms; and underwent a CT angiogram of the chest at that time.  See further notes for details of today's visit.  Patient is scheduled to return for labs and follow-up visit with Dr. [name] on 06/10/2016.

## 2016-05-25 NOTE — Assessment & Plan Note (Signed)
Patient was seen in the emergency department on 05/19/2016 for URI/bronchitis symptoms.  She underwent a CT angiogram of the chest at that time; which revealed:  IMPRESSION: 1. Limited CTA of the pulmonary arteries. Negative for pulmonary embolism to the segmental level. 2. Mild pulmonary edema with small right pleural effusion. 3. Right-sided fissural nodularity and asymmetric hilar soft tissue is primarily concerning for lymphangitic carcinomatosis. Atypical infection is also considered in the acute setting. 4. Left axillary dissection with unexpected soft tissue around the surgical clips. 5. Suggest PET-CT follow-up after convalescence to evaluate #3 and #4.  Patient was given a Z-Pak and sent home.  Patient states that she continues with a congested cough that is sometimes productive of some green secretions.  She states that the cough keeps her awake at night.  She continues to feel mildly short of breath; but denies any recent fevers or chills.  Exam today reveals breath sounds essentially clear bilaterally; with only a dry cough.  In no obvious shortness of breath.  Vital signs were stable; with O2 sat 97% on room air and temperature 98.3.  Patient will be prescribed Levaquin antibiotics and an albuterol inhaler to see if this helps.  She was also advised to try over-the-counter cough syrup if needed.  Patient also requested and was given a refill of her oxycodone today.  Patient is scheduled for follow-up with both labs and a visit with Dr. [name] on 05/10/2016.  Patient was advised to call/return or go directly to the emergency department for any worsening symptoms whatsoever.

## 2016-05-25 NOTE — Progress Notes (Addendum)
SYMPTOM MANAGEMENT CLINIC    Chief Complaint: Bronchitis  HPI:  Laura Matthews 50 y.o. female diagnosed with breast cancer.  Currently undergoing tamoxifen oral therapy.  Patient persisted the cancer Center today with bronchitis symptoms.  She was seen in the emergency department on 05/19/2016 and underwent a CT angiogram of the chest at that time.  She was prescribed Z-Pak; which she has completed.    No history exists.    Review of Systems  Respiratory: Positive for cough, sputum production and shortness of breath. Negative for hemoptysis and wheezing.   All other systems reviewed and are negative.   Past Medical History:  Diagnosis Date  . Anemia    low iron  . Arthritis   . Depression    pt denies   . Family history of malignant neoplasm of breast   . Head ache   . Hot flashes   . Lymphedema of breast    left  . Malignant neoplasm of breast (female), unspecified site    left breast  . Mental disorder    depression-  . Newly diagnosed diabetes 11/12/2014   chemo increasing blood glucose; not on meds now  . Radiation 04/22/15-06/23/15   Left Breast    Past Surgical History:  Procedure Laterality Date  . BREAST LUMPECTOMY WITH SENTINEL LYMPH NODE BIOPSY Left 06/11/2014   Procedure: BREAST LUMPECTOMY WITH SENTINEL LYMPH NODE BX, POSSIBLE AXILLARY LYMPH NODE DISSECTION;  Surgeon: Stark Klein, MD;  Location: [facility];  Service: General;  Laterality: Left;  . EVACUATION BREAST HEMATOMA Left 07/10/2014   Procedure: EVACUATION OF LEFT BREAST HEMATOMA ;  Surgeon: Stark Klein, MD;  Location: [facility];  Service: General;  Laterality: Left;  . FOOT SURGERY  2011   rt  . INCISION AND DRAINAGE Left 07/02/2014   Procedure: Drainage left breast seroma;  Surgeon: Stark Klein, MD;  Location: [facility];  Service: General;  Laterality: Left;  . INCISION AND DRAINAGE ABSCESS N/A 11/12/2014   Procedure: ASPIRATION AND INCISION AND DRAINAGE LEFT BREAST  ABSCESS;  Surgeon: Michael Boston, MD;  Location: [facility];  Service: General;  Laterality: N/A;  . [place]   right  . PORT-A-CATH REMOVAL Left 07/22/2015   Procedure: REMOVAL PORT-A-CATH;  Surgeon: Stark Klein, MD;  Location: [facility];  Service: General;  Laterality: Left;  . PORTACATH PLACEMENT N/A 07/02/2014   Procedure: INSERTION PORT-A-CATH;  Surgeon: Stark Klein, MD;  Location: [facility];  Service: General;  Laterality: N/A;  . TUBAL LIGATION    . UTERINE FIBROID SURGERY  2005   ablation    has Opiate dependence ([place]); Homeless; Breast cancer of upper-outer quadrant of left female breast ([place]); Family history of malignant neoplasm of breast; Breast cancer metastasized to axillary lymph node ([place]); Neutropenia ([place]); Lymphedema of breast; Abscess of left breast; Newly diagnosed diabetes ([place]); Depression; Breast pain; and Bronchitis on her problem list.    is allergic to hydrocodone.    Medication List       Accurate as of 05/25/16  3:32 PM. Always use your most recent med list.          albuterol 108 (90 Base) MCG/ACT inhaler Commonly known as:  PROVENTIL HFA;VENTOLIN HFA Inhale 1-2 puffs into the lungs every 6 (six) hours as needed for wheezing or shortness of breath.   ALPRAZolam 1 MG tablet Commonly known as:  XANAX Take 1 tablet by mouth three times a day as directed  gabapentin 300 MG capsule Commonly known as:  NEURONTIN Take 1 capsule (300 mg total) by mouth at bedtime.   glipiZIDE 5 MG 24 hr tablet Commonly known as:  GLUCOTROL XL TAKE ONE TABLET BY MOUTH TWICE DAILY AS DIRECTED   levofloxacin 500 MG tablet Commonly known as:  LEVAQUIN Take 1 tablet (500 mg total) by mouth daily.   oxyCODONE-acetaminophen 10-325 MG tablet Commonly known as:  PERCOCET Take 1 tablet by mouth four times a day as needed for pain   tamoxifen 20 MG tablet Commonly known as:  NOLVADEX Take 20 mg by mouth daily.        PHYSICAL  EXAMINATION  Oncology Vitals 05/25/2016 05/20/2016  Height 178 cm -  Weight 69.763 kg -  Weight (lbs) 153 lbs 13 oz -  BMI (kg/m2) 22.07 kg/m2 -  Temp 98.3 -  Pulse 76 96  Resp 18 18  SpO2 97 97  BSA (m2) 1.86 m2 -  Some encounter information is confidential and restricted. Go to Review Flowsheets activity to see all data.   BP Readings from Last 2 Encounters:  05/25/16 (!) 142/84  05/20/16 153/79    Physical Exam  Constitutional: She is oriented to person, place, and time and well-developed, well-nourished, and in no distress.  HENT:  Head: Normocephalic and atraumatic.  Mouth/Throat: Oropharynx is clear and moist.  Eyes: Conjunctivae and EOM are normal. Pupils are equal, round, and reactive to light. Right eye exhibits no discharge. Left eye exhibits no discharge. No scleral icterus.  Neck: Normal range of motion. Neck supple. No JVD present. No tracheal deviation present. No thyromegaly present.  Cardiovascular: Normal rate, regular rhythm, normal heart sounds and intact distal pulses.   Pulmonary/Chest: Effort normal and breath sounds normal. No respiratory distress. She has no wheezes. She has no rales. She exhibits no tenderness.  Dry cough noted on exam.  Abdominal: Soft. Bowel sounds are normal. She exhibits no distension and no mass. There is no tenderness. There is no rebound and no guarding.  Musculoskeletal: Normal range of motion. She exhibits no edema or tenderness.  Lymphadenopathy:    She has no cervical adenopathy.  Neurological: She is alert and oriented to person, place, and time. Gait normal.  Skin: Skin is warm and dry. No rash noted. No erythema. No pallor.  Psychiatric: Affect normal.  Nursing note and vitals reviewed.   LABORATORY DATA:. No visits with results within 3 Day(s) from this visit.  Latest known visit with results is:  Admission on 05/19/2016, Discharged on 05/20/2016  Component Date Value Ref Range Status  . Sodium 05/19/2016 132* 135 -  145 mmol/L Final  . Potassium 05/19/2016 3.7  3.5 - 5.1 mmol/L Final  . Chloride 05/19/2016 95* 101 - 111 mmol/L Final  . CO2 05/19/2016 29  22 - 32 mmol/L Final  . Glucose, Bld 05/19/2016 172* 65 - 99 mg/dL Final  . BUN 05/19/2016 <5* 6 - 20 mg/dL Final  . Creatinine, Ser 05/19/2016 0.59  0.44 - 1.00 mg/dL Final  . Calcium 05/19/2016 9.6  8.9 - 10.3 mg/dL Final  . GFR calc non Af Amer 05/19/2016 >60  >60 mL/min Final  . GFR calc Af Amer 05/19/2016 >60  >60 mL/min Final   Comment: (NOTE) The eGFR has been calculated using the CKD EPI equation. This calculation has not been validated in all clinical situations. eGFR's persistently <60 mL/min signify possible Chronic Kidney Disease.   . Anion gap 05/19/2016 8  5 - 15 Final  .  WBC 05/19/2016 12.6* 4.0 - 10.5 K/uL Final  . RBC 05/19/2016 3.87  3.87 - 5.11 MIL/uL Final  . Hemoglobin 05/19/2016 11.3* 12.0 - 15.0 g/dL Final  . HCT 05/19/2016 33.6* 36.0 - 46.0 % Final  . MCV 05/19/2016 86.8  78.0 - 100.0 fL Final  . MCH 05/19/2016 29.2  26.0 - 34.0 pg Final  . MCHC 05/19/2016 33.6  30.0 - 36.0 g/dL Final  . RDW 05/19/2016 13.7  11.5 - 15.5 % Final  . Platelets 05/19/2016 350  150 - 400 K/uL Final  . Neutrophils Relative % 05/19/2016 74  % Final  . Neutro Abs 05/19/2016 9.4* 1.7 - 7.7 K/uL Final  . Lymphocytes Relative 05/19/2016 16  % Final  . Lymphs Abs 05/19/2016 2.0  0.7 - 4.0 K/uL Final  . Monocytes Relative 05/19/2016 9  % Final  . Monocytes Absolute 05/19/2016 1.1* 0.1 - 1.0 K/uL Final  . Eosinophils Relative 05/19/2016 1  % Final  . Eosinophils Absolute 05/19/2016 0.1  0.0 - 0.7 K/uL Final  . Basophils Relative 05/19/2016 0  % Final  . Basophils Absolute 05/19/2016 0.0  0.0 - 0.1 K/uL Final  . B Natriuretic Peptide 05/19/2016 17.2  0.0 - 100.0 pg/mL Final  . D-Dimer, Quant 05/19/2016 1.41* 0.00 - 0.50 ug/mL-FEU Final   Comment: (NOTE) At the manufacturer cut-off of 0.50 ug/mL FEU, this assay has been documented to exclude PE  with a sensitivity and negative predictive value of 97 to 99%.  At this time, this assay has not been approved by the FDA to exclude DVT/VTE. Results should be correlated with clinical presentation.   . Troponin i, poc 05/19/2016 0.02  0.00 - 0.08 ng/mL Final  . Comment 3 05/19/2016          Final   Comment: Due to the release kinetics of cTnI, a negative result within the first hours of the onset of symptoms does not rule out myocardial infarction with certainty. If myocardial infarction is still suspected, repeat the test at appropriate intervals.     RADIOGRAPHIC STUDIES: No results found.  ASSESSMENT/PLAN:    Bronchitis Patient was seen in the emergency department on 05/19/2016 for URI/bronchitis symptoms.  She underwent a CT angiogram of the chest at that time; which revealed:  IMPRESSION: 1. Limited CTA of the pulmonary arteries. Negative for pulmonary embolism to the segmental level. 2. Mild pulmonary edema with small right pleural effusion. 3. Right-sided fissural nodularity and asymmetric hilar soft tissue is primarily concerning for lymphangitic carcinomatosis. Atypical infection is also considered in the acute setting. 4. Left axillary dissection with unexpected soft tissue around the surgical clips. 5. Suggest PET-CT follow-up after convalescence to evaluate #3 and #4.  Patient was given a Z-Pak and sent home.  Patient states that she continues with a congested cough that is sometimes productive of some green secretions.  She states that the cough keeps her awake at night.  She continues to feel mildly short of breath; but denies any recent fevers or chills.  Exam today reveals breath sounds essentially clear bilaterally; with only a dry cough.  In no obvious shortness of breath.  Vital signs were stable; with O2 sat 97% on room air and temperature 98.3.  Patient will be prescribed Levaquin antibiotics and an albuterol inhaler to see if this helps.  She was also  advised to try over-the-counter cough syrup if needed.  Patient also requested and was given a refill of her oxycodone today.  Patient is scheduled for follow-up  with both labs and a visit with Dr. [name] on 05/10/2016.  Patient was advised to call/return or go directly to the emergency department for any worsening symptoms whatsoever.  Breast cancer of upper-outer quadrant of left female breast [facility]) Patient continues to take tamoxifen oral therapy as directed.  She recently went to the emergency department for complaint of URI/bronchitis symptoms; and underwent a CT angiogram of the chest at that time.  See further notes for details of today's visit.  Patient is scheduled to return for labs and follow-up visit with Dr. [name] on 06/10/2016.   Patient stated understanding of all instructions; and was in agreement with this plan of care. The patient knows to call the clinic with any problems, questions or concerns.   Total time spent with patient was 25 minutes;  with greater than 75 percent of that time spent in face to face counseling regarding patient's symptoms,  and coordination of care and follow up.  Disclaimer:This dictation was prepared with Dragon/digital dictation along with [employer]. Any transcriptional errors that result from this process are unintentional.  Drue Second, NP 05/25/2016    Addendum: Have requested that scheduling cancel patient's labs scheduled for 06/08/2016; and to instead have labs drawn on 06/10/2016 just prior to her follow-up visit with Dr. [name] on that same day at 1 PM.

## 2016-05-31 ENCOUNTER — Telehealth: Payer: Self-pay | Admitting: *Deleted

## 2016-05-31 NOTE — Telephone Encounter (Signed)
TC to patient to follow up to [facility] visit on 05/25/16. Currently being treated for bronchitis. Pt states she is still coughing but feels somewhat better. Taking antibiotics and inhaler as prescribed. Pt has appt with Dr. [name] on 06/10/16 @ 1pm. Pt voices understanding of upcoming appts and that she can call back with any change/worsening of symptoms.

## 2016-06-02 ENCOUNTER — Other Ambulatory Visit: Payer: Self-pay | Admitting: Oncology

## 2016-06-02 DIAGNOSIS — N63 Unspecified lump in unspecified breast: Secondary | ICD-10-CM

## 2016-06-02 DIAGNOSIS — R921 Mammographic calcification found on diagnostic imaging of breast: Secondary | ICD-10-CM

## 2016-06-06 ENCOUNTER — Inpatient Hospital Stay (HOSPITAL_COMMUNITY)
Admission: EM | Admit: 2016-06-06 | Discharge: 2016-06-12 | DRG: 180 | Disposition: A | Discharge status: Home / Self Care | Payer: Medicaid Other | Attending: Internal Medicine | Admitting: Internal Medicine

## 2016-06-06 ENCOUNTER — Emergency Department (HOSPITAL_COMMUNITY): Payer: Medicaid Other

## 2016-06-06 ENCOUNTER — Encounter (HOSPITAL_COMMUNITY): Payer: Self-pay | Admitting: Emergency Medicine

## 2016-06-06 DIAGNOSIS — R0602 Shortness of breath: Secondary | ICD-10-CM

## 2016-06-06 DIAGNOSIS — Z87891 Personal history of nicotine dependence: Secondary | ICD-10-CM

## 2016-06-06 DIAGNOSIS — J90 Pleural effusion, not elsewhere classified: Secondary | ICD-10-CM

## 2016-06-06 DIAGNOSIS — C50412 Malignant neoplasm of upper-outer quadrant of left female breast: Secondary | ICD-10-CM | POA: Diagnosis present

## 2016-06-06 DIAGNOSIS — Z803 Family history of malignant neoplasm of breast: Secondary | ICD-10-CM

## 2016-06-06 DIAGNOSIS — D649 Anemia, unspecified: Secondary | ICD-10-CM | POA: Diagnosis present

## 2016-06-06 DIAGNOSIS — Z170 Estrogen receptor positive status [ER+]: Secondary | ICD-10-CM

## 2016-06-06 DIAGNOSIS — Z833 Family history of diabetes mellitus: Secondary | ICD-10-CM

## 2016-06-06 DIAGNOSIS — E440 Moderate protein-calorie malnutrition: Secondary | ICD-10-CM | POA: Diagnosis present

## 2016-06-06 DIAGNOSIS — R0902 Hypoxemia: Secondary | ICD-10-CM

## 2016-06-06 DIAGNOSIS — J910 Malignant pleural effusion: Secondary | ICD-10-CM | POA: Diagnosis present

## 2016-06-06 DIAGNOSIS — C3490 Malignant neoplasm of unspecified part of unspecified bronchus or lung: Secondary | ICD-10-CM

## 2016-06-06 DIAGNOSIS — Z7984 Long term (current) use of oral hypoglycemic drugs: Secondary | ICD-10-CM

## 2016-06-06 DIAGNOSIS — E119 Type 2 diabetes mellitus without complications: Secondary | ICD-10-CM

## 2016-06-06 DIAGNOSIS — C50919 Malignant neoplasm of unspecified site of unspecified female breast: Secondary | ICD-10-CM | POA: Diagnosis present

## 2016-06-06 DIAGNOSIS — J9601 Acute respiratory failure with hypoxia: Secondary | ICD-10-CM | POA: Diagnosis present

## 2016-06-06 DIAGNOSIS — C7801 Secondary malignant neoplasm of right lung: Principal | ICD-10-CM | POA: Diagnosis present

## 2016-06-06 DIAGNOSIS — E860 Dehydration: Secondary | ICD-10-CM | POA: Diagnosis present

## 2016-06-06 DIAGNOSIS — R059 Cough, unspecified: Secondary | ICD-10-CM

## 2016-06-06 DIAGNOSIS — R079 Chest pain, unspecified: Secondary | ICD-10-CM | POA: Diagnosis present

## 2016-06-06 DIAGNOSIS — F419 Anxiety disorder, unspecified: Secondary | ICD-10-CM | POA: Diagnosis present

## 2016-06-06 DIAGNOSIS — Z8049 Family history of malignant neoplasm of other genital organs: Secondary | ICD-10-CM

## 2016-06-06 DIAGNOSIS — Z9889 Other specified postprocedural states: Secondary | ICD-10-CM

## 2016-06-06 DIAGNOSIS — R918 Other nonspecific abnormal finding of lung field: Secondary | ICD-10-CM

## 2016-06-06 DIAGNOSIS — Z923 Personal history of irradiation: Secondary | ICD-10-CM

## 2016-06-06 DIAGNOSIS — Z801 Family history of malignant neoplasm of trachea, bronchus and lung: Secondary | ICD-10-CM

## 2016-06-06 DIAGNOSIS — R05 Cough: Secondary | ICD-10-CM

## 2016-06-06 DIAGNOSIS — M549 Dorsalgia, unspecified: Secondary | ICD-10-CM | POA: Diagnosis present

## 2016-06-06 DIAGNOSIS — Z9221 Personal history of antineoplastic chemotherapy: Secondary | ICD-10-CM

## 2016-06-06 DIAGNOSIS — Z885 Allergy status to narcotic agent status: Secondary | ICD-10-CM

## 2016-06-06 DIAGNOSIS — C7931 Secondary malignant neoplasm of brain: Secondary | ICD-10-CM

## 2016-06-06 DIAGNOSIS — C773 Secondary and unspecified malignant neoplasm of axilla and upper limb lymph nodes: Secondary | ICD-10-CM

## 2016-06-06 DIAGNOSIS — C7800 Secondary malignant neoplasm of unspecified lung: Secondary | ICD-10-CM

## 2016-06-06 DIAGNOSIS — E43 Unspecified severe protein-calorie malnutrition: Secondary | ICD-10-CM | POA: Insufficient documentation

## 2016-06-06 DIAGNOSIS — Z79810 Long term (current) use of selective estrogen receptor modulators (SERMs): Secondary | ICD-10-CM

## 2016-06-06 DIAGNOSIS — C7951 Secondary malignant neoplasm of bone: Secondary | ICD-10-CM | POA: Diagnosis present

## 2016-06-06 MED ORDER — ALBUTEROL SULFATE (2.5 MG/3ML) 0.083% IN NEBU
5.0000 mg | INHALATION_SOLUTION | Freq: Once | RESPIRATORY_TRACT | Status: AC
  Administered 2016-06-06: 5 mg via RESPIRATORY_TRACT
  Filled 2016-06-06: qty 6

## 2016-06-06 NOTE — ED Notes (Signed)
Bed: XN23 Expected date:  Expected time:  Means of arrival:  Comments: 75 F cough/CA pt

## 2016-06-06 NOTE — ED Triage Notes (Signed)
V/s on arrival ems 162/99, pulse 108, rr 16, 98 percent on room air.

## 2016-06-06 NOTE — ED Triage Notes (Signed)
Pt comes ed via ems, c/o of non productive cough attacks, sob on assertion. Pt has a recent dx Of lung cancer two weeks ago. Pt has has un coming apt with her cancer doctor on the 11th. Pt has been taking robitussin  With little relief. Pt complains of generalized pain around back and chest from cough. Pt comes from home, [address], [place]. Family on the way.

## 2016-06-06 NOTE — ED Provider Notes (Addendum)
[facility] DEPT Provider Note   CSN: [id number] Arrival date & time: 06/06/16  2141  By signing my name below, I, Gwenlyn Fudge, attest that this documentation has been prepared under the direction and in the presence of Junius Creamer, NP. Electronically Signed: Gwenlyn Fudge, ED Scribe. 06/06/16. 12:05 AM.  First MD Initiated Contact with Patient 06/06/16 2352    History   Chief Complaint Chief Complaint  Patient presents with  . Cough  . Shortness of Breath    on assertion    The history is provided by the patient. No language interpreter was used.    HPI Comments: Laura Matthews is a 50 y.o. female with PMHx of Breast Cancer who presents to the Emergency Department complaining of persistent, non-productive coughing attacks onset 3 weeks ago. Pt reports associated nausea, fever, decreased appetite,  weight loss (21 lbs) and generalized pain around chest and back. She also reports that she has shortness of breath on exertion. She states her symptoms are made worse when laying down. Pt does not use breathing treatments at home. Pt was recently diagnosed with Lung Cancer 2 weeks ago and has an upcoming appointment with her Oncologist on 06/10/16. She states she has mostly been eating yogurt, but has struggled to eat more solid foods but unable to tolerate due to nausea and decreased appetite. Pt has been taking Robitussin with little relief. She was given Oxycodone with no relief to pain. Pt was given an antibiotic and an inhaler that had no relief to symptoms. Pt's last chemo treatment for her Breast cancer was in 08/2015.   Past Medical History:  Diagnosis Date  . Anemia    low iron  . Arthritis   . Depression    pt denies   . Family history of malignant neoplasm of breast   . Head ache   . Hot flashes   . Lymphedema of breast    left  . Malignant neoplasm of breast (female), unspecified site    left breast  . Mental disorder    depression-  . Newly diagnosed diabetes  11/12/2014   chemo increasing blood glucose; not on meds now  . Radiation 04/22/15-06/23/15   Left Breast    Patient Active Problem List   Diagnosis Date Noted  . Cancer, metastatic to bone ([place]) 06/10/2016  . Brain metastasis ([place])   . Protein-calorie malnutrition, severe 06/09/2016  . Pleural effusion   . S/P thoracentesis   . Acute respiratory failure with hypoxia ([place]) 06/07/2016  . Back pain 06/07/2016  . Protein-calorie malnutrition, moderate ([place]) 06/07/2016  . Cough   . Hypoxia   . Lung metastases ([place])   . Shortness of breath   . Bronchitis 05/25/2016  . Chest pain 02/02/2015  . Depression 01/26/2015  . Abscess of left breast 11/12/2014  . Diabetes mellitus without complication ([place]) 97/35/3299  . Neutropenia ([place]) 08/05/2014  . Lymphedema of breast 08/05/2014  . Breast cancer metastasized to axillary lymph node ([place]) 06/11/2014  . Family history of malignant neoplasm of breast   . Breast cancer of upper-outer quadrant of left female breast ([place]) 05/19/2014  . Opiate dependence ([place]) 05/01/2012    Class: Acute  . Homeless 05/01/2012    Class: Acute    Past Surgical History:  Procedure Laterality Date  . BREAST LUMPECTOMY WITH SENTINEL LYMPH NODE BIOPSY Left 06/11/2014   Procedure: BREAST LUMPECTOMY WITH SENTINEL LYMPH NODE BX, POSSIBLE AXILLARY LYMPH NODE DISSECTION;  Surgeon: Stark Klein, MD;  Location: [facility];  Service: General;  Laterality: Left;  . EVACUATION BREAST HEMATOMA Left 07/10/2014   Procedure: EVACUATION OF LEFT BREAST HEMATOMA ;  Surgeon: Stark Klein, MD;  Location: [facility];  Service: General;  Laterality: Left;  . FOOT SURGERY  2011   rt  . INCISION AND DRAINAGE Left 07/02/2014   Procedure: Drainage left breast seroma;  Surgeon: Stark Klein, MD;  Location: [facility];  Service: General;  Laterality: Left;  . INCISION AND DRAINAGE ABSCESS N/A 11/12/2014   Procedure: ASPIRATION AND INCISION AND DRAINAGE LEFT BREAST  ABSCESS;  Surgeon: Michael Boston, MD;  Location: [facility];  Service: General;  Laterality: N/A;  . [place]   right  . PORT-A-CATH REMOVAL Left 07/22/2015   Procedure: REMOVAL PORT-A-CATH;  Surgeon: Stark Klein, MD;  Location: [facility];  Service: General;  Laterality: Left;  . PORTACATH PLACEMENT N/A 07/02/2014   Procedure: INSERTION PORT-A-CATH;  Surgeon: Stark Klein, MD;  Location: [facility];  Service: General;  Laterality: N/A;  . TUBAL LIGATION    . UTERINE FIBROID SURGERY  2005   ablation    OB History    Gravida Para Term Preterm AB Living   '3 3 3     3   '$ SAB TAB Ectopic Multiple Live Births           2       Home Medications    Prior to Admission medications   Medication Sig Start Date End Date Taking? Authorizing Provider  albuterol (PROVENTIL HFA;VENTOLIN HFA) 108 (90 Base) MCG/ACT inhaler Inhale 2 puffs into the lungs every 6 (six) hours as needed for wheezing or shortness of breath. 07/25/16   Chauncey Cruel, MD  dexamethasone (DECADRON) 4 MG tablet Take 1 tablet (4 mg total) by mouth every 8 (eight) hours. 07/15/16   Chauncey Cruel, MD  feeding supplement, GLUCERNA SHAKE, (GLUCERNA SHAKE) LIQD Take 237 mLs by mouth 3 (three) times daily between meals. 07/15/16   Chauncey Cruel, MD  glipiZIDE (GLUCOTROL XL) 5 MG 24 hr tablet Take 2 tablets (10 mg total) by mouth daily with breakfast. 07/25/16   Chauncey Cruel, MD  letrozole [facility]) 2.5 MG tablet Take 1 tablet (2.5 mg total) by mouth daily. 07/25/16   Chauncey Cruel, MD  LORazepam (ATIVAN) 0.5 MG tablet Take 1 tablet (0.5 mg total) by mouth every 8 (eight) hours as needed for anxiety. 07/15/16   Chauncey Cruel, MD  methadone (DOLOPHINE) 10 MG tablet Take 1 tablet (10 mg total) by mouth every 8 (eight) hours. 07/25/16   Chauncey Cruel, MD  mirtazapine (REMERON) 30 MG tablet Take 1 tablet (30 mg total) by mouth at bedtime. 07/25/16   Chauncey Cruel, MD  ondansetron  (ZOFRAN ODT) 4 MG disintegrating tablet Take 1 tablet (4 mg total) by mouth every 8 (eight) hours as needed for nausea or vomiting. 07/25/16   Chauncey Cruel, MD  palbociclib [facility]) 125 MG capsule Take 1 capsule (125 mg total) by mouth daily with breakfast. Take whole with food. 07/15/16   Chauncey Cruel, MD  prochlorperazine (COMPAZINE) 10 MG tablet Take 1 tablet (10 mg total) by mouth every 6 (six) hours as needed for nausea or vomiting. 07/25/16   Chauncey Cruel, MD    Family History Family History  Problem Relation Age of Onset  . Breast cancer Mother 16    currently 27; TAH/BSO d/t ?cervical ca at 20  . Lung cancer  Father     deceased 32  . Breast cancer Maternal Aunt     dx 73s; currently in her late 60s  . Diabetes Neg Hx     Social History Social History  Substance Use Topics  . Smoking status: Former Smoker    Packs/day: 0.25    Years: 20.00    Types: Cigarettes  . Smokeless tobacco: Never Used  . Alcohol use No     Comment: Percoset,Vicodin, Oxycotin     Allergies   Hydrocodone   Review of Systems Review of Systems  Constitutional: Positive for appetite change, chills, fatigue, fever and unexpected weight change.  HENT: Negative for trouble swallowing.   Respiratory: Positive for cough and shortness of breath. Negative for wheezing.   Cardiovascular: Negative for chest pain.  Gastrointestinal: Positive for nausea. Negative for abdominal pain.  Musculoskeletal: Positive for back pain.  Psychiatric/Behavioral: The patient is nervous/anxious.   All other systems reviewed and are negative.    Physical Exam Updated Vital Signs BP (!) 148/73 (BP Location: Right Arm)   Pulse 74   Temp 97.9 F (36.6 C) (Oral)   Resp 20   Ht '5\' 10"'$  (1.778 m)   Wt 72.1 kg   SpO2 96%   BMI 22.81 kg/m   Physical Exam  Constitutional: She appears well-developed and well-nourished.  HENT:  Head: Normocephalic.  Eyes: Pupils are equal, round, and reactive to light.    Cardiovascular: Normal rate.   Pulmonary/Chest: Effort normal.  Neurological: She is alert.  Skin: Skin is warm.  Psychiatric: She has a normal mood and affect.  Nursing note and vitals reviewed.    ED Treatments / Results  DIAGNOSTIC STUDIES: Oxygen Saturation is 91% on [place] '@2'$  L/m, low by my interpretation.    COORDINATION OF CARE: 12:02 AM Discussed treatment plan with pt at bedside which includes iv fluids, labs antiemetic pain control and pt agreed to plan.  Labs (all labs ordered are listed, but only abnormal results are displayed) Labs Reviewed  CBC WITH DIFFERENTIAL/PLATELET - Abnormal; Notable for the following:       Result Value   WBC 10.6 (*)    RBC 3.84 (*)    Hemoglobin 11.0 (*)    HCT 33.2 (*)    Platelets 446 (*)    Neutro Abs 8.1 (*)    All other components within normal limits  GLUCOSE, CAPILLARY - Abnormal; Notable for the following:    Glucose-Capillary 139 (*)    All other components within normal limits  GLUCOSE, CAPILLARY - Abnormal; Notable for the following:    Glucose-Capillary 206 (*)    All other components within normal limits  BASIC METABOLIC PANEL - Abnormal; Notable for the following:    Glucose, Bld 125 (*)    Creatinine, Ser 0.36 (*)    Calcium 8.3 (*)    All other components within normal limits  GLUCOSE, CAPILLARY - Abnormal; Notable for the following:    Glucose-Capillary 121 (*)    All other components within normal limits  BASIC METABOLIC PANEL - Abnormal; Notable for the following:    Glucose, Bld 146 (*)    Creatinine, Ser 0.40 (*)    All other components within normal limits  CANCER ANTIGEN 27.29 - Abnormal; Notable for the following:    CA 27.29 1,356.9 (*)    All other components within normal limits  CEA - Abnormal; Notable for the following:    CEA 8.3 (*)    All other components within normal limits  GLUCOSE, CAPILLARY - Abnormal; Notable for the following:    Glucose-Capillary 136 (*)    All other components within  normal limits  GLUCOSE, CAPILLARY - Abnormal; Notable for the following:    Glucose-Capillary 185 (*)    All other components within normal limits  GLUCOSE, CAPILLARY - Abnormal; Notable for the following:    Glucose-Capillary 251 (*)    All other components within normal limits  LACTATE DEHYDROGENASE, BODY FLUID - Abnormal; Notable for the following:    LD, Fluid 180 (*)    All other components within normal limits  BODY FLUID CELL COUNT WITH DIFFERENTIAL - Abnormal; Notable for the following:    Appearance, Fluid CLEAR (*)    Monocyte-Macrophage-Serous Fluid 8 (*)    All other components within normal limits  GLUCOSE, CAPILLARY - Abnormal; Notable for the following:    Glucose-Capillary 125 (*)    All other components within normal limits  GLUCOSE, CAPILLARY - Abnormal; Notable for the following:    Glucose-Capillary 203 (*)    All other components within normal limits  GLUCOSE, CAPILLARY - Abnormal; Notable for the following:    Glucose-Capillary 142 (*)    All other components within normal limits  GLUCOSE, CAPILLARY - Abnormal; Notable for the following:    Glucose-Capillary 213 (*)    All other components within normal limits  GLUCOSE, CAPILLARY - Abnormal; Notable for the following:    Glucose-Capillary 216 (*)    All other components within normal limits  GLUCOSE, CAPILLARY - Abnormal; Notable for the following:    Glucose-Capillary 173 (*)    All other components within normal limits  GLUCOSE, CAPILLARY - Abnormal; Notable for the following:    Glucose-Capillary 266 (*)    All other components within normal limits  GLUCOSE, CAPILLARY - Abnormal; Notable for the following:    Glucose-Capillary 252 (*)    All other components within normal limits  GLUCOSE, CAPILLARY - Abnormal; Notable for the following:    Glucose-Capillary 218 (*)    All other components within normal limits  GLUCOSE, CAPILLARY - Abnormal; Notable for the following:    Glucose-Capillary 201 (*)     All other components within normal limits  GLUCOSE, CAPILLARY - Abnormal; Notable for the following:    Glucose-Capillary 138 (*)    All other components within normal limits  GLUCOSE, CAPILLARY - Abnormal; Notable for the following:    Glucose-Capillary 213 (*)    All other components within normal limits  GLUCOSE, CAPILLARY - Abnormal; Notable for the following:    Glucose-Capillary 148 (*)    All other components within normal limits  GLUCOSE, CAPILLARY - Abnormal; Notable for the following:    Glucose-Capillary 243 (*)    All other components within normal limits  GLUCOSE, CAPILLARY - Abnormal; Notable for the following:    Glucose-Capillary 175 (*)    All other components within normal limits  GLUCOSE, CAPILLARY - Abnormal; Notable for the following:    Glucose-Capillary 231 (*)    All other components within normal limits  I-STAT CHEM 8, ED - Abnormal; Notable for the following:    Chloride 98 (*)    Glucose, Bld 146 (*)    Hemoglobin 11.9 (*)    HCT 35.0 (*)    All other components within normal limits  BODY FLUID CULTURE  PHOSPHORUS  MAGNESIUM  PROCALCITONIN  APTT  PROTIME-INR  CANCER ANTIGEN 19-9  GLUCOSE, SEROUS FLUID  PROTEIN, BODY FLUID  CHOLESTEROL, BODY FLUID  LACTATE DEHYDROGENASE  PROTEIN,  TOTAL  CHOLESTEROL, TOTAL  PH, BODY FLUID  RHEUMATOID FACTORS, FLUID  ADENOSIDE DEAMINASE, PLEURAL FL  CYTOLOGY - NON PAP    EKG  EKG Interpretation  Date/Time:  Monday June 06 2016 21:49:24 EDT Ventricular Rate:  99 PR Interval:    QRS Duration: 79 QT Interval:  355 QTC Calculation: 456 R Axis:   66 Text Interpretation:  Sinus rhythm Normal ECG When compared with ECG of 05/19/2016, No significant change was found Confirmed by [facility]  MD, DAVID ([id number]) on 06/07/2016 12:41:38 AM       Radiology No results found.  Procedures Procedures (including critical care time)  Medications Ordered in ED Medications  HYDROmorphone (DILAUDID) injection 1 mg (1 mg  Intravenous Given 06/07/16 1404)  ondansetron (ZOFRAN) injection 4 mg (not administered)  ketorolac (TORADOL) 30 MG/ML injection 30 mg (30 mg Intravenous Given 06/09/16 1746)  albuterol (PROVENTIL) (2.5 MG/3ML) 0.083% nebulizer solution 5 mg (5 mg Nebulization Given 06/06/16 2153)  HYDROmorphone (DILAUDID) injection 1 mg (1 mg Intravenous Given 06/07/16 0110)  ondansetron (ZOFRAN) injection 4 mg (4 mg Intravenous Given 06/07/16 0111)  0.9 %  sodium chloride infusion ( Intravenous New Bag/Given 06/07/16 0349)  0.9 %  sodium chloride infusion ( Intravenous New Bag/Given 06/07/16 0412)  iopamidol (ISOVUE-370) 76 % injection 100 mL (100 mLs Intravenous Contrast Given 06/07/16 0358)  furosemide (LASIX) tablet 20 mg (20 mg Oral Given 06/08/16 0900)  ketorolac (TORADOL) 30 MG/ML injection (  Duplicate 11/04/38 0867)  LORazepam (ATIVAN) tablet 1 mg (1 mg Oral Given 06/09/16 1928)  gadobenate dimeglumine (MULTIHANCE) injection 15 mL (15 mLs Intravenous Contrast Given 06/09/16 2007)  HYDROmorphone (DILAUDID) injection 1 mg (1 mg Intravenous Given 06/10/16 0930)     Initial Impression / Assessment and Plan / ED Course  I have reviewed the triage vital signs and the nursing notes.  Pertinent labs & imaging results that were available during my care of the patient were reviewed by me and considered in my medical decision making (see chart for details).  Clinical Course   Patient's chest x-ray is unchanged.  Labs are within normal parameters, but patient is visibly distressed.  I did ambulate her without oxygen.  She desats to 74% and becomes tachycardic. She cannot lay flat due to the pain in her back despite medication and positioning cough has not really responded to albuterol or Phenergan with codeine Patient has benefited hospital hospice requesting a repeat CT angiogram, although she had one July 20 he is requesting a repeat.   Final Clinical Impressions(s) / ED Diagnoses   Final diagnoses:  Hypoxia  Shortness of  breath  Cough  Malignant neoplasm of lung, unspecified laterality, unspecified part of lung ([place])    New Prescriptions Discharge Medication List as of 06/12/2016  2:12 PM    START taking these medications   Details  benzonatate (TESSALON) 200 MG capsule Take 1 capsule (200 mg total) by mouth 3 (three) times daily., Starting Sun 06/12/2016, Normal    chlorpheniramine-HYDROcodone (TUSSIONEX) 10-8 MG/5ML SUER Take 5 mLs by mouth every 12 (twelve) hours as needed for cough., Starting Sun 06/12/2016, Normal    dexamethasone (DECADRON) 4 MG tablet Take 1 tablet (4 mg total) by mouth every 8 (eight) hours., Starting Sun 06/12/2016, Print    feeding supplement, GLUCERNA SHAKE, (GLUCERNA SHAKE) LIQD Take 237 mLs by mouth 3 (three) times daily between meals., Starting Sun 06/12/2016, Normal    letrozole (FEMARA) 2.5 MG tablet Take 1 tablet (2.5 mg total) by mouth daily., Starting  Sun 06/12/2016, Normal    LORazepam (ATIVAN) 0.5 MG tablet Take 1 tablet (0.5 mg total) by mouth every 8 (eight) hours as needed for anxiety., Starting Sun 06/12/2016, Print    ondansetron (ZOFRAN ODT) 4 MG disintegrating tablet Take 1 tablet (4 mg total) by mouth every 8 (eight) hours as needed for nausea or vomiting., Starting Sun 06/12/2016, Print    oxyCODONE (OXYCONTIN) 60 MG 12 hr tablet Take 60 mg by mouth every 12 (twelve) hours., Starting Sun 06/12/2016, Print    oxyCODONE 10 MG TABS Take 1 tablet (10 mg total) by mouth every 4 (four) hours as needed for moderate pain., Starting Sun 06/12/2016, Print    oxyCODONE-acetaminophen (PERCOCET/ROXICET) 5-325 MG tablet Take 1-2 tablets by mouth every 6 (six) hours as needed for moderate pain., Starting Sun 06/12/2016, Print    polyethylene glycol (MIRALAX / GLYCOLAX) packet Take 17 g by mouth 2 (two) times daily., Starting Sun 06/12/2016, Normal       I personally performed the services described in this documentation, which was scribed in my presence. The recorded information  has been reviewed and is accurate.    Junius Creamer, NP 06/07/16 0034    Junius Creamer, NP 06/07/16 7425    Junius Creamer, NP 95/63/87 [id number]    David Glick, MD 32/95/18 8416    Junius Creamer, NP 60/63/01 [id number]    Delora Fuel, MD 93/23/55 [id number]

## 2016-06-07 ENCOUNTER — Encounter (HOSPITAL_COMMUNITY): Payer: Self-pay

## 2016-06-07 ENCOUNTER — Observation Stay (HOSPITAL_COMMUNITY): Payer: Medicaid Other

## 2016-06-07 DIAGNOSIS — C3490 Malignant neoplasm of unspecified part of unspecified bronchus or lung: Secondary | ICD-10-CM

## 2016-06-07 DIAGNOSIS — Z801 Family history of malignant neoplasm of trachea, bronchus and lung: Secondary | ICD-10-CM

## 2016-06-07 DIAGNOSIS — I891 Lymphangitis: Secondary | ICD-10-CM | POA: Diagnosis not present

## 2016-06-07 DIAGNOSIS — R52 Pain, unspecified: Secondary | ICD-10-CM | POA: Diagnosis not present

## 2016-06-07 DIAGNOSIS — M549 Dorsalgia, unspecified: Secondary | ICD-10-CM | POA: Diagnosis present

## 2016-06-07 DIAGNOSIS — R0902 Hypoxemia: Secondary | ICD-10-CM

## 2016-06-07 DIAGNOSIS — C50412 Malignant neoplasm of upper-outer quadrant of left female breast: Secondary | ICD-10-CM | POA: Diagnosis not present

## 2016-06-07 DIAGNOSIS — J9601 Acute respiratory failure with hypoxia: Secondary | ICD-10-CM | POA: Diagnosis not present

## 2016-06-07 DIAGNOSIS — C50912 Malignant neoplasm of unspecified site of left female breast: Secondary | ICD-10-CM | POA: Diagnosis not present

## 2016-06-07 DIAGNOSIS — M899 Disorder of bone, unspecified: Secondary | ICD-10-CM | POA: Diagnosis not present

## 2016-06-07 DIAGNOSIS — R079 Chest pain, unspecified: Secondary | ICD-10-CM

## 2016-06-07 DIAGNOSIS — C7931 Secondary malignant neoplasm of brain: Secondary | ICD-10-CM | POA: Diagnosis not present

## 2016-06-07 DIAGNOSIS — R059 Cough, unspecified: Secondary | ICD-10-CM | POA: Insufficient documentation

## 2016-06-07 DIAGNOSIS — C50919 Malignant neoplasm of unspecified site of unspecified female breast: Secondary | ICD-10-CM | POA: Diagnosis not present

## 2016-06-07 DIAGNOSIS — E119 Type 2 diabetes mellitus without complications: Secondary | ICD-10-CM | POA: Diagnosis not present

## 2016-06-07 DIAGNOSIS — C7800 Secondary malignant neoplasm of unspecified lung: Secondary | ICD-10-CM

## 2016-06-07 DIAGNOSIS — C773 Secondary and unspecified malignant neoplasm of axilla and upper limb lymph nodes: Secondary | ICD-10-CM

## 2016-06-07 DIAGNOSIS — R0602 Shortness of breath: Secondary | ICD-10-CM | POA: Diagnosis not present

## 2016-06-07 DIAGNOSIS — R05 Cough: Secondary | ICD-10-CM | POA: Insufficient documentation

## 2016-06-07 DIAGNOSIS — Z803 Family history of malignant neoplasm of breast: Secondary | ICD-10-CM

## 2016-06-07 DIAGNOSIS — E440 Moderate protein-calorie malnutrition: Secondary | ICD-10-CM | POA: Diagnosis present

## 2016-06-07 DIAGNOSIS — M545 Low back pain: Secondary | ICD-10-CM | POA: Diagnosis not present

## 2016-06-07 LAB — I-STAT CHEM 8, ED
BUN: 12 mg/dL (ref 6–20)
CALCIUM ION: 1.13 mmol/L (ref 1.13–1.30)
CHLORIDE: 98 mmol/L — AB (ref 101–111)
Creatinine, Ser: 0.7 mg/dL (ref 0.44–1.00)
Glucose, Bld: 146 mg/dL — ABNORMAL HIGH (ref 65–99)
HCT: 35 % — ABNORMAL LOW (ref 36.0–46.0)
Hemoglobin: 11.9 g/dL — ABNORMAL LOW (ref 12.0–15.0)
POTASSIUM: 3.9 mmol/L (ref 3.5–5.1)
SODIUM: 138 mmol/L (ref 135–145)
TCO2: 31 mmol/L (ref 0–100)

## 2016-06-07 LAB — BASIC METABOLIC PANEL
ANION GAP: 5 (ref 5–15)
BUN: 10 mg/dL (ref 6–20)
CALCIUM: 8.3 mg/dL — AB (ref 8.9–10.3)
CHLORIDE: 105 mmol/L (ref 101–111)
CO2: 29 mmol/L (ref 22–32)
Creatinine, Ser: 0.36 mg/dL — ABNORMAL LOW (ref 0.44–1.00)
GFR calc Af Amer: >60 mL/min (ref >60)
GFR calc non Af Amer: >60 mL/min (ref >60)
GLUCOSE: 125 mg/dL — AB (ref 65–99)
Potassium: 4 mmol/L (ref 3.5–5.1)
Sodium: 139 mmol/L (ref 135–145)

## 2016-06-07 LAB — CBC WITH DIFFERENTIAL/PLATELET
BASOS ABS: 0 10*3/uL (ref 0.0–0.1)
Basophils Relative: 0 %
EOS ABS: 0.1 10*3/uL (ref 0.0–0.7)
Eosinophils Relative: 1 %
HCT: 33.2 % — ABNORMAL LOW (ref 36.0–46.0)
HEMOGLOBIN: 11 g/dL — AB (ref 12.0–15.0)
LYMPHS PCT: 15 %
Lymphs Abs: 1.6 10*3/uL (ref 0.7–4.0)
MCH: 28.6 pg (ref 26.0–34.0)
MCHC: 33.1 g/dL (ref 30.0–36.0)
MCV: 86.5 fL (ref 78.0–100.0)
Monocytes Absolute: 0.9 10*3/uL (ref 0.1–1.0)
Monocytes Relative: 8 %
NEUTROS PCT: 76 %
Neutro Abs: 8.1 10*3/uL — ABNORMAL HIGH (ref 1.7–7.7)
Platelets: 446 10*3/uL — ABNORMAL HIGH (ref 150–400)
RBC: 3.84 MIL/uL — AB (ref 3.87–5.11)
RDW: 13.7 % (ref 11.5–15.5)
WBC: 10.6 10*3/uL — AB (ref 4.0–10.5)

## 2016-06-07 LAB — PROTIME-INR
INR: 1.07
PROTHROMBIN TIME: 14 s (ref 11.4–15.2)

## 2016-06-07 LAB — APTT: APTT: 31 s (ref 24–36)

## 2016-06-07 LAB — PROCALCITONIN: PROCALCITONIN: 0.44 ng/mL

## 2016-06-07 LAB — GLUCOSE, CAPILLARY
GLUCOSE-CAPILLARY: 139 mg/dL — AB (ref 65–99)
Glucose-Capillary: 206 mg/dL — ABNORMAL HIGH (ref 65–99)
Glucose-Capillary: 121 mg/dL — ABNORMAL HIGH (ref 65–99)

## 2016-06-07 MED ORDER — IOPAMIDOL (ISOVUE-370) INJECTION 76%
100.0000 mL | Freq: Once | INTRAVENOUS | Status: AC | PRN
  Administered 2016-06-07: 100 mL via INTRAVENOUS

## 2016-06-07 MED ORDER — BENZONATATE 100 MG PO CAPS
200.0000 mg | ORAL_CAPSULE | Freq: Three times a day (TID) | ORAL | Status: DC
  Administered 2016-06-07 – 2016-06-12 (×16): 200 mg via ORAL
  Filled 2016-06-07 (×16): qty 2

## 2016-06-07 MED ORDER — ADULT MULTIVITAMIN W/MINERALS CH
1.0000 | ORAL_TABLET | Freq: Every day | ORAL | Status: DC
  Administered 2016-06-07 – 2016-06-12 (×6): 1 via ORAL
  Filled 2016-06-07 (×6): qty 1

## 2016-06-07 MED ORDER — ALBUTEROL SULFATE (2.5 MG/3ML) 0.083% IN NEBU
5.0000 mg | INHALATION_SOLUTION | RESPIRATORY_TRACT | Status: DC | PRN
Start: 2016-06-07 — End: 2016-06-12

## 2016-06-07 MED ORDER — SODIUM CHLORIDE 0.9% FLUSH
3.0000 mL | Freq: Two times a day (BID) | INTRAVENOUS | Status: DC
  Administered 2016-06-08 – 2016-06-11 (×6): 3 mL via INTRAVENOUS

## 2016-06-07 MED ORDER — ONDANSETRON HCL 4 MG/2ML IJ SOLN: 4.0000 mg | Freq: Three times a day (TID) | INTRAMUSCULAR | Status: AC | PRN

## 2016-06-07 MED ORDER — OXYCODONE-ACETAMINOPHEN 5-325 MG PO TABS
2.0000 | ORAL_TABLET | Freq: Four times a day (QID) | ORAL | Status: DC | PRN
  Administered 2016-06-07 (×2): 2 via ORAL
  Filled 2016-06-07 (×2): qty 2

## 2016-06-07 MED ORDER — TAMOXIFEN CITRATE 10 MG PO TABS
20.0000 mg | ORAL_TABLET | Freq: Every day | ORAL | Status: DC
  Administered 2016-06-07 – 2016-06-08 (×2): 20 mg via ORAL
  Filled 2016-06-07 (×6): qty 2

## 2016-06-07 MED ORDER — PROMETHAZINE-CODEINE 6.25-10 MG/5ML PO SYRP
5.0000 mL | ORAL_SOLUTION | Freq: Four times a day (QID) | ORAL | Status: DC | PRN
  Administered 2016-06-07 (×2): 5 mL via ORAL
  Filled 2016-06-07 (×2): qty 5

## 2016-06-07 MED ORDER — IPRATROPIUM BROMIDE 0.02 % IN SOLN: 0.5000 mg | Freq: Four times a day (QID) | RESPIRATORY_TRACT | Status: DC

## 2016-06-07 MED ORDER — FUROSEMIDE 20 MG PO TABS
20.0000 mg | ORAL_TABLET | Freq: Two times a day (BID) | ORAL | Status: AC
Start: 2016-06-07 — End: 2016-06-08
  Administered 2016-06-07 – 2016-06-08 (×2): 20 mg via ORAL
  Filled 2016-06-07 (×2): qty 1

## 2016-06-07 MED ORDER — INSULIN ASPART 100 UNIT/ML ~~LOC~~ SOLN
0.0000 [IU] | Freq: Three times a day (TID) | SUBCUTANEOUS | Status: DC
  Administered 2016-06-07: 3 [IU] via SUBCUTANEOUS
  Administered 2016-06-07 (×2): 1 [IU] via SUBCUTANEOUS
  Administered 2016-06-08: 5 [IU] via SUBCUTANEOUS
  Administered 2016-06-08 (×2): 1 [IU] via SUBCUTANEOUS
  Administered 2016-06-09 (×2): 3 [IU] via SUBCUTANEOUS
  Administered 2016-06-09: 1 [IU] via SUBCUTANEOUS
  Administered 2016-06-10 – 2016-06-11 (×4): 3 [IU] via SUBCUTANEOUS
  Administered 2016-06-11 (×2): 1 [IU] via SUBCUTANEOUS
  Administered 2016-06-12: 2 [IU] via SUBCUTANEOUS

## 2016-06-07 MED ORDER — SODIUM CHLORIDE 0.9 % IV SOLN
Freq: Once | INTRAVENOUS | Status: AC
  Administered 2016-06-07: 04:00:00 via INTRAVENOUS

## 2016-06-07 MED ORDER — OXYCODONE-ACETAMINOPHEN 10-325 MG PO TABS
2.0000 | ORAL_TABLET | Freq: Four times a day (QID) | ORAL | Status: DC | PRN
Start: 2016-06-07 — End: 2016-06-07

## 2016-06-07 MED ORDER — SODIUM CHLORIDE 0.9 % IV SOLN
INTRAVENOUS | Status: AC
  Administered 2016-06-07: 04:00:00 via INTRAVENOUS

## 2016-06-07 MED ORDER — HYDROMORPHONE HCL 1 MG/ML IJ SOLN
1.0000 mg | INTRAMUSCULAR | Status: DC | PRN
  Administered 2016-06-07: 1 mg via INTRAVENOUS
  Filled 2016-06-07: qty 1

## 2016-06-07 MED ORDER — HYDROMORPHONE HCL 1 MG/ML IJ SOLN
1.0000 mg | Freq: Once | INTRAMUSCULAR | Status: AC
  Administered 2016-06-07: 1 mg via INTRAVENOUS
  Filled 2016-06-07: qty 1

## 2016-06-07 MED ORDER — IPRATROPIUM BROMIDE 0.02 % IN SOLN: 0.5000 mg | Freq: Four times a day (QID) | RESPIRATORY_TRACT | Status: DC | PRN

## 2016-06-07 MED ORDER — ENSURE ENLIVE PO LIQD
237.0000 mL | Freq: Two times a day (BID) | ORAL | Status: DC
  Administered 2016-06-07 – 2016-06-09 (×4): 237 mL via ORAL

## 2016-06-07 MED ORDER — ENOXAPARIN SODIUM 40 MG/0.4ML ~~LOC~~ SOLN
40.0000 mg | SUBCUTANEOUS | Status: DC
  Administered 2016-06-07 – 2016-06-12 (×6): 40 mg via SUBCUTANEOUS
  Filled 2016-06-07 (×6): qty 0.4

## 2016-06-07 MED ORDER — HYDROMORPHONE HCL 1 MG/ML IJ SOLN
1.0000 mg | INTRAMUSCULAR | Status: AC | PRN
  Administered 2016-06-07 (×2): 1 mg via INTRAVENOUS
  Filled 2016-06-07 (×2): qty 1

## 2016-06-07 MED ORDER — OXYCODONE HCL 5 MG PO TABS
10.0000 mg | ORAL_TABLET | ORAL | Status: DC | PRN
  Administered 2016-06-08 – 2016-06-11 (×6): 10 mg via ORAL
  Filled 2016-06-07 (×7): qty 2

## 2016-06-07 MED ORDER — HYDROCOD POLST-CPM POLST ER 10-8 MG/5ML PO SUER
5.0000 mL | Freq: Two times a day (BID) | ORAL | Status: DC
  Administered 2016-06-07 – 2016-06-12 (×11): 5 mL via ORAL
  Filled 2016-06-07 (×11): qty 5

## 2016-06-07 MED ORDER — OXYCODONE-ACETAMINOPHEN 5-325 MG PO TABS
2.0000 | ORAL_TABLET | Freq: Four times a day (QID) | ORAL | Status: DC | PRN
  Administered 2016-06-08 – 2016-06-12 (×12): 2 via ORAL
  Filled 2016-06-07 (×12): qty 2

## 2016-06-07 MED ORDER — ACETAMINOPHEN 325 MG PO TABS: 650.0000 mg | ORAL_TABLET | Freq: Four times a day (QID) | ORAL | Status: DC | PRN

## 2016-06-07 MED ORDER — ONDANSETRON HCL 4 MG/2ML IJ SOLN
4.0000 mg | Freq: Once | INTRAMUSCULAR | Status: AC
  Administered 2016-06-07: 4 mg via INTRAVENOUS
  Filled 2016-06-07: qty 2

## 2016-06-07 MED ORDER — OXYCODONE HCL 5 MG PO TABS
10.0000 mg | ORAL_TABLET | Freq: Four times a day (QID) | ORAL | Status: DC | PRN
  Administered 2016-06-07 (×2): 10 mg via ORAL
  Filled 2016-06-07 (×2): qty 2

## 2016-06-07 MED ORDER — LORAZEPAM 2 MG/ML IJ SOLN
1.0000 mg | Freq: Four times a day (QID) | INTRAMUSCULAR | Status: DC | PRN
Start: 2016-06-07 — End: 2016-06-12
  Administered 2016-06-07 – 2016-06-12 (×10): 1 mg via INTRAVENOUS
  Administered 2016-06-12: 08:00:00 via INTRAVENOUS
  Filled 2016-06-07 (×10): qty 1

## 2016-06-07 MED ORDER — ACETAMINOPHEN 650 MG RE SUPP: 650.0000 mg | Freq: Four times a day (QID) | RECTAL | Status: DC | PRN

## 2016-06-07 MED ORDER — INSULIN ASPART 100 UNIT/ML ~~LOC~~ SOLN
0.0000 [IU] | Freq: Every day | SUBCUTANEOUS | Status: DC
  Administered 2016-06-08 – 2016-06-11 (×3): 2 [IU] via SUBCUTANEOUS

## 2016-06-07 NOTE — Care Management Note (Signed)
Case Management Note  Patient Details  Name: LEO WEYANDT MRN: [id number] Date of Birth: 11/30/1965  Subjective/Objective: 50 y/o f admitted w/Acute resp failure. Hx: Breast Ca.From home, has pcp,pharmacy. On 02 in hospital-if home 02 needed can arrange w/qualifying 02 sats documented in progress note, home 02 order. Provided patient w/HHC agency list-await choice-recc HHRN-med mgmnt-await HHRN,[id number].                   Action/Plan:d/c home w/HHC.   Expected Discharge Date:                  [place] Plan:  [place]:     Discharge planning Services  CM Consult  Post Acute Care Choice:    Choice offered to:  Patient  DME Arranged:    DME Agency:     [facility] [place]    [place]     Status of Service:  In process, will continue to follow  If discussed at Long Length of Stay Meetings, dates discussed:    Additional Comments:  MInnocence, Schlotzhauer RN 06/07/2016, 3:55 PM

## 2016-06-07 NOTE — H&P (Signed)
History and Physical    Laura Matthews OFB:[id number] DOB: Jan 04, 1966 DOA: 06/06/2016  Referring MD/NP/PA:   PCP: [name], MD   Patient coming from:  The patient is coming from home.  At baseline, pt is independent for most of ADL.      Chief Complaint: SOB, cough, CP and back pain  HPI: Laura Matthews is a 50 y.o. 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  Patient reports that she has worsening shortness of breath and dry cough today. She also has chest pain and back pain. The chest pain is located in the frontal chest, constant, 5 out of 10 in severity, nonradiating. It is pleuritic, and is aggravated by coughing and deep breath. No tenderness over calf areas. No fever of chills.  She also has severe back pain, from upper back to lower back. The lower lower back pain is mainly located right paraspinal muscle area. The upper back pain involves whole upper back between shoulders. The pain is constant, 10 out of 10 in severity, nonradiating. It is not aggravated or relieved by any normal factors. Patient does not have incontinence of bowel movement and urination. No numbness or tingling in extremities.  Patient has nausea, and vomited 4 days ago, but no vomiting today. No abdominal pain or diarrhea. She states that she has poor appetite and decreased oral intake. Lost 28 pounds recently. Patient denies symptoms of UTI or rashes.     ED Course: pt was found to have oxygen desaturation to 95% on room air, WBC 10.6, temperature 91.1, heart rate is in 90s, electrolytes and renal function okay. CXR showed basilar opacity and possible interstitial edema. Pt is placed on tele bed for obs.  CTA of chest  showed 1. No evidence of pulmonary embolus. 2. Small bilateral pleural effusions noted. 3. Vague soft tissue density tracking about the right hilum, measuring approximately 3.7 x 3.7 x 2.0 cm, with mild mass effect on the right-sided pulmonary arteries and right-sided bronchioles. This raises concern for malignancy. Underlying right-sided pleural nodularity could reflect lymphangitic carcinomatosis, as previously noted. 4. Interstitial prominence, with bibasilar airspace opacities, concerning for pulmonary edema, though underlying pneumonia cannot be excluded. 5. Trace pericardial fluid may remain within normal limits, though malignant pericardial effusion cannot be excluded. Vague soft tissue density tracks about the subcarinal region and azygoesophageal recess, concerning for extension of malignancy. 6. Diffuse skin thickening and irregularity along the left breast. Would correlate for any evidence of inflammatory breast cancer. Underlying postoperative change and small postoperative fluid collection measuring 2.8 cm, with retraction of the left nipple. 7. Numerous lytic lesions throughout the visualized thoracic and upper lumbar spine, concerning for metastatic disease. Small sclerotic focus also noted at T5  Review of Systems:   General: no fevers, chills, no changes in body weight, has poor appetite, has fatigue HEENT: no blurry vision, hearing changes or sore throat Pulm: has dyspnea, coughing, no wheezing CV: has chest pain, no palpitations Abd: has nausea, no vomiting, abdominal pain, diarrhea, constipation GU: no dysuria, burning on urination, increased urinary frequency, hematuria  Ext: no leg edema Neuro: no unilateral weakness, numbness, or tingling, no vision change or hearing loss Skin: no rash MSK: has back pain. Heme: No easy bruising.  Travel history:  No recent long distant travel.  Allergy:  Allergies  Allergen Reactions  . Hydrocodone Itching and Nausea Only    Past  Medical History:  Diagnosis Date  . Anemia    low iron  . Arthritis   . Depression    pt denies   . Family history of malignant neoplasm of breast   . Head ache   . Hot flashes   . Lymphedema of breast    left  . Malignant neoplasm of breast (female), unspecified site    left breast  . Mental disorder    depression-  . Newly diagnosed diabetes 11/12/2014   chemo increasing blood glucose; not on meds now  . Radiation 04/22/15-06/23/15   Left Breast    Past Surgical History:  Procedure Laterality Date  . BREAST LUMPECTOMY WITH SENTINEL LYMPH NODE BIOPSY Left 06/11/2014   Procedure: BREAST LUMPECTOMY WITH SENTINEL LYMPH NODE BX, POSSIBLE AXILLARY LYMPH NODE DISSECTION;  Surgeon: Stark Klein, MD;  Location: [place];  Service: General;  Laterality: Left;  . EVACUATION BREAST HEMATOMA Left 07/10/2014   Procedure: EVACUATION OF LEFT BREAST HEMATOMA ;  Surgeon: Stark Klein, MD;  Location: [place];  Service: General;  Laterality: Left;  . FOOT SURGERY  2011   rt  . INCISION AND DRAINAGE Left 07/02/2014   Procedure: Drainage left breast seroma;  Surgeon: Stark Klein, MD;  Location: [facility];  Service: General;  Laterality: Left;  . INCISION AND DRAINAGE ABSCESS N/A 11/12/2014   Procedure: ASPIRATION AND INCISION AND DRAINAGE LEFT BREAST ABSCESS;  Surgeon: Michael Boston, MD;  Location: [facility];  Service: General;  Laterality: N/A;  . [place]   right  . PORT-A-CATH REMOVAL Left 07/22/2015   Procedure: REMOVAL PORT-A-CATH;  Surgeon: Stark Klein, MD;  Location: [place];  Service: General;  Laterality: Left;  . PORTACATH PLACEMENT N/A 07/02/2014   Procedure: INSERTION PORT-A-CATH;  Surgeon: Stark Klein, MD;  Location: [place];  Service: General;  Laterality: N/A;  . TUBAL LIGATION    . UTERINE FIBROID SURGERY  2005   ablation    Social History:  reports that she has quit smoking. Her smoking use included Cigarettes.  She has a 5.00 pack-year smoking history. She has never used smokeless tobacco. She reports that she uses drugs, including Oxycodone and Other-see comments. She reports that she does not drink alcohol.  Family History:  Family History  Problem Relation Age of Onset  . Breast cancer Mother 54    currently 68; TAH/BSO d/t ?cervical ca at 1  . Lung cancer Father     deceased 33  . Breast cancer Maternal Aunt     dx 41s; currently in her late 71s  . Diabetes Neg Hx      Prior to Admission medications   Medication Sig Start Date End Date Taking? Authorizing Provider  albuterol (PROVENTIL HFA;VENTOLIN HFA) 108 (90 Base) MCG/ACT inhaler Inhale 1-2 puffs into the lungs every 6 (six) hours as needed for wheezing or shortness of breath. 05/25/16  Yes Susanne Borders, NP  brompheniramine-pseudoephedrine-DM 30-2-10 MG/5ML syrup Take 30 mLs by mouth 4 (four) times daily as needed (cough).   Yes Historical Provider, MD  glipiZIDE (GLUCOTROL XL) 5 MG 24 hr tablet TAKE ONE TABLET BY MOUTH TWICE DAILY AS DIRECTED 04/14/16  Yes Historical Provider, MD  oxyCODONE-acetaminophen (PERCOCET) 10-325 MG tablet Take 1 tablet by mouth four times a day as needed for pain 05/25/16  Yes  Susanne Borders, NP  tamoxifen (NOLVADEX) 20 MG tablet Take 20 mg by mouth daily. 11/24/15  Yes Historical Provider, MD  gabapentin (NEURONTIN) 300 MG capsule Take 1 capsule (300 mg total) by mouth at bedtime. Patient not taking: Reported on 06/06/2016 07/01/15   Chauncey Cruel, MD  levofloxacin (LEVAQUIN) 500 MG tablet Take 1 tablet (500 mg total) by mouth daily. Patient not taking: Reported on 06/06/2016 05/25/16   Susanne Borders, NP    Physical Exam: Vitals:   06/06/16 2144 06/07/16 0202 06/07/16 0351 06/07/16 0431  BP: 131/97 153/95 145/91 (!) 164/92  Pulse: 99 92 110 92  Resp: '18 23 20 18  '$ Temp: 99.1 F (37.3 C) 98.6 F (37 C) 98.6 F (37 C) 98.5 F (36.9 C)  TempSrc: Oral Oral Oral Oral  SpO2: 91% 98% 98% 93%  Weight: 69.9  kg (154 lb)   72.1 kg (158 lb 15.2 oz)  Height: '5\' 10"'$  (1.778 m)   '5\' 10"'$  (1.778 m)   General: Not in acute distress HEENT:       Eyes: PERRL, EOMI, no scleral icterus.       ENT: No discharge from the ears and nose, no pharynx injection, no tonsillar enlargement.        Neck: No JVD, no bruit, no mass felt. Heme: No neck lymph node enlargement. Cardiac: S1/S2, RRR, No murmurs, No gallops or rubs. Pulm: has decreased air movement on the left side. No rales, wheezing, rhonchi or rubs. Abd: Soft, nondistended, nontender, no rebound pain, no organomegaly, BS present. GU: No hematuria Ext: No pitting leg edema bilaterally. 2+DP/PT pulse bilaterally. Musculoskeletal: diffused tenderness in whole spine Skin: No rashes.  Neuro: Alert, oriented X3, cranial nerves II-XII grossly intact, moves all extremities normally.  Psych: Patient is not psychotic, no suicidal or hemocidal ideation.  Labs on Admission: I have personally reviewed following labs and imaging studies  CBC:  Recent Labs Lab 06/07/16 0100 06/07/16 0105  WBC 10.6*  --   NEUTROABS 8.1*  --   HGB 11.0* 11.9*  HCT 33.2* 35.0*  MCV 86.5  --   PLT 446*  --    Basic Metabolic Panel:  Recent Labs Lab 06/07/16 0105  NA 138  K 3.9  CL 98*  GLUCOSE 146*  BUN 12  CREATININE 0.70   GFR: Estimated Creatinine Clearance: 93 mL/min (by C-G formula based on SCr of 0.8 mg/dL). Liver Function Tests: No results for input(s): AST, ALT, ALKPHOS, BILITOT, PROT, ALBUMIN in the last 168 hours. No results for input(s): LIPASE, AMYLASE in the last 168 hours. No results for input(s): AMMONIA in the last 168 hours. Coagulation Profile: No results for input(s): INR, PROTIME in the last 168 hours. Cardiac Enzymes: No results for input(s): CKTOTAL, CKMB, CKMBINDEX, TROPONINI in the last 168 hours. BNP (last 3 results) No results for input(s): PROBNP in the last 8760 hours. HbA1C: No results for input(s): HGBA1C in the last 72  hours. CBG: No results for input(s): GLUCAP in the last 168 hours. Lipid Profile: No results for input(s): CHOL, HDL, LDLCALC, TRIG, CHOLHDL, LDLDIRECT in the last 72 hours. Thyroid Function Tests: No results for input(s): TSH, T4TOTAL, FREET4, T3FREE, THYROIDAB in the last 72 hours. Anemia Panel: No results for input(s): VITAMINB12, FOLATE, FERRITIN, TIBC, IRON, RETICCTPCT in the last 72 hours. Urine analysis:    Component Value Date/Time   COLORURINE YELLOW 06/03/2014 1050   APPEARANCEUR CLEAR 06/03/2014 1050   LABSPEC >1.030 (H) 06/03/2014 1050   PHURINE 6.0  06/03/2014 1050   GLUCOSEU NEGATIVE 06/03/2014 1050   HGBUR SMALL (A) 06/03/2014 1050   BILIRUBINUR SMALL (A) 06/03/2014 1050   KETONESUR NEGATIVE 06/03/2014 1050   PROTEINUR NEGATIVE 06/03/2014 1050   UROBILINOGEN 0.2 06/03/2014 1050   NITRITE NEGATIVE 06/03/2014 1050   LEUKOCYTESUR TRACE (A) 06/03/2014 1050   Sepsis Labs: '@LABRCNTIP'$ (procalcitonin:4,lacticidven:4) )No results found for this or any previous visit (from the past 240 hour(s)).   Radiological Exams on Admission: Dg Chest 2 View  Result Date: 06/06/2016 CLINICAL DATA:  Acute onset of shortness of breath. Initial encounter. EXAM: CHEST  2 VIEW COMPARISON:  Chest radiograph and CTA of the chest performed 05/19/2016 FINDINGS: The lungs are well-aerated. Vascular congestion is noted. Mild bibasilar opacities may reflect minimal interstitial edema. There is no evidence of pleural effusion or pneumothorax. The heart is mildly enlarged. No acute osseous abnormalities are seen. Scattered clips are seen at the left axilla. IMPRESSION: Vascular congestion and mild cardiomegaly. Mild bibasilar opacities may reflect minimal interstitial edema. Electronically Signed   By: Garald Balding M.D.   On: 06/06/2016 23:13   Ct Angio Chest Pe W And/or Wo Contrast  Result Date: 06/07/2016 CLINICAL DATA:  Subacute onset of nonproductive cough, fever and nausea. 21 pounds of weight loss.  Generalized back and chest pain. Shortness of breath with exertion. Mild leukocytosis. Initial encounter. EXAM: CT ANGIOGRAPHY CHEST WITH CONTRAST TECHNIQUE: Multidetector CT imaging of the chest was performed using the standard protocol during bolus administration of intravenous contrast. Multiplanar CT image reconstructions and MIPs were obtained to evaluate the vascular anatomy. CONTRAST:  100 mL of Isovue 300 IV contrast COMPARISON:  CTA of the chest performed 05/19/2016, and chest radiograph performed 06/06/2016 FINDINGS: There is no evidence of pulmonary embolus. Small bilateral pleural effusions are noted. Note is made of vague soft tissue density tracking about the right hilum, measuring approximately 3.7 x 3.7 x 2.0 cm, with mild mass effect on the right-sided pulmonary arteries and right-sided bronchioles. This raises concern for malignancy. Interstitial prominence is noted, with bibasilar airspace opacities, raising concern for pulmonary edema, though underlying pneumonia cannot be excluded. Underlying fissural nodularity on the right side could reflect lymphangitic carcinomatosis, as previously noted. Peripheral scarring is noted. No pneumothorax is identified. Trace pericardial fluid is noted. This may be within normal limits, though malignant pericardial effusion cannot be excluded. No definite mediastinal lymphadenopathy is seen. Vague soft tissue density tracks about the subcarinal region and azygoesophageal recess. The great vessels are grossly unremarkable in appearance. No axillary lymphadenopathy is seen. The visualized portions of the thyroid gland are unremarkable in appearance. There is diffuse skin thickening and irregularity along the left breast. Would correlate for any evidence of inflammatory breast cancer. Postoperative change and a small postoperative fluid collection measuring 2.8 cm are noted underlying the left nipple, with retraction of the left nipple. The visualized portions of  the liver and spleen are unremarkable. Numerous lytic lesions are noted throughout the visualized thoracic and upper lumbar spine, concerning for metastatic disease. A small sclerotic focus is also noted at T5. Review of the MIP images confirms the above findings. IMPRESSION: 1. No evidence of pulmonary embolus. 2. Small bilateral pleural effusions noted. 3. Vague soft tissue density tracking about the right hilum, measuring approximately 3.7 x 3.7 x 2.0 cm, with mild mass effect on the right-sided pulmonary arteries and right-sided bronchioles. This raises concern for malignancy. Underlying right-sided pleural nodularity could reflect lymphangitic carcinomatosis, as previously noted. 4. Interstitial prominence, with bibasilar airspace opacities, concerning  for pulmonary edema, though underlying pneumonia cannot be excluded. 5. Trace pericardial fluid may remain within normal limits, though malignant pericardial effusion cannot be excluded. Vague soft tissue density tracks about the subcarinal region and azygoesophageal recess, concerning for extension of malignancy. 6. Diffuse skin thickening and irregularity along the left breast. Would correlate for any evidence of inflammatory breast cancer. Underlying postoperative change and small postoperative fluid collection measuring 2.8 cm, with retraction of the left nipple. 7. Numerous lytic lesions throughout the visualized thoracic and upper lumbar spine, concerning for metastatic disease. Small sclerotic focus also noted at T5. Electronically Signed   By: Garald Balding M.D.   On: 06/07/2016 04:34     EKG: Independently reviewed.  Sinus rhythm, QTC 456, no ischemic change. Assessment/Plan Principal Problem:   Acute respiratory failure with hypoxia (HCC) Active Problems:   Breast cancer of upper-outer quadrant of left female breast (HCC)   Breast cancer metastasized to axillary lymph node (HCC)   Diabetes mellitus without complication (HCC)   Chest pain    Back pain   Protein-calorie malnutrition, moderate (HCC)   Acute respiratory failure with hypoxia: this is most likely due to possible lung malignancy and lymphangitic carcinomatosis as evidenced by CTA of chest. No PE on CTA. No fever, less likely to have PNA. -place on tele bed for obs -Atrovent nebs and prn albuterol nebs -Phenergan/codeine syrup for cough -prn Dilaudid and Percocet for pain -please call Dr. [name] for consultation in AM.  Breast cancer of upper-outer quadrant of left female breast: s/p of L lumpectomy, chemotherapy and XRT. Followed by Dr. [name]. Currently on tamoxifen. -Follow-up with Dr. [name]  DM-II: Last A1c 8.5, poorly controled. Patient is taking glipizide at home -SSI  Protein-calorie malnutrition, moderate:  -consult to nutrition.  Anemia: Hemoglobin stable, 11.0 on admission. -Follow-up by CBC  DVT ppx: SQ Lovenox Code Status: Full code Family Communication: None at bed side.   Disposition Plan:  Anticipate discharge back to previous home environment Consults called:  none Admission status: Obs / tele   Date of Service 06/07/2016    Ivor Costa [facility] Pager [phone]  If 7PM-7AM, please contact night-coverage [email or web address] Password [email or web address] 06/07/2016, 7:10 AM

## 2016-06-07 NOTE — Progress Notes (Signed)
Initial Nutrition Assessment  DOCUMENTATION CODES:   Not applicable  INTERVENTION:  - Continue Ensure Enlive BID, each supplement provides 350 kcal and 20 grams of protein - Will order daily multivitamin with minerals. - Encourage PO intakes of meals and supplements.  - RD will follow-up 8/10.  NUTRITION DIAGNOSIS:   Increased nutrient needs related to catabolic illness, cancer and cancer related treatments as evidenced by estimated needs.  GOAL:   Patient will meet greater than or equal to 90% of their needs  MONITOR:   PO intake, Supplement acceptance, Weight trends, Labs, I & O's  REASON FOR ASSESSMENT:   Malnutrition Screening Tool, Consult Assessment of nutrition requirement/status  ASSESSMENT:   50 y.o. 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  Pt seen for MST and consult. BMI indicates normal weight. No intakes documented since admission. Per chart review, pt with coughing spells and SOB PTA. She also indicated N/V x4 days PTA with decreased appetite, poor PO intakes and recent 28 lb weight loss. Pt was recently dx with lung cancer, per chart review.  Pt currently having coughing spell and has requested coughing medication from RN; unable to talk with pt at this time. RD will be out of the hospital the rest of the day and will follow-up 8/10 to obtain all information from pt at that time.   Unable to complete physical assessment at this time but will complete at follow-up on 8/10. Based on pt's report of 28 lb weight loss in comparison to CBW, she has lost 15% body weight in unknown time frame. Per chart review, this reported weight loss is consistent with weight trends since 06/16/15  at which time pt weighed 183 lbs. A 15% body weight loss in 1 year is not significant. Also per weight hx review, pt has gained 5 lbs in the past 13 days; will continue to monitor weight trends.  Medications reviewed. Labs reviewed. IVF: NS @ 125 mL/hr.    Diet Order:  Diet Carb Modified Fluid consistency: Thin; Room service appropriate? Yes  Skin:  Reviewed, no issues  Last BM:  PTA  Height:   Ht Readings from Last 1 Encounters:  06/07/16 '5\' 10"'$  (1.778 m)    Weight:   Wt Readings from Last 1 Encounters:  06/07/16 158 lb 15.2 oz (72.1 kg)    Ideal Body Weight:  68.18 kg  BMI:  Body mass index is 22.81 kg/m.  Estimated Nutritional Needs:   Kcal:  2160-2380 (30-33 kcal/kg)  Protein:  90-105 grams  Fluid:  >/= 2 L/day  EDUCATION NEEDS:   No education needs identified at this time    Jarome Matin, MS, RD, LDN Inpatient Clinical Dietitian Pager # [phone] After hours/weekend pager # [phone]

## 2016-06-07 NOTE — Progress Notes (Addendum)
PROGRESS NOTE    Laura Matthews  WYO:[id number] DOB: [birth year] DOA: 06/06/2016  PCP: [name], MD   Brief Narrative:  50 y/o with DM 2, breast cancer s/p treatment  With chemo, surgery and radiation who is being monitored by Dr [name] who has had a cough for a few weeks now and was treated as outpt with Levaquin but did not improve. Cough is severe and on the most part non-productive worse with taking deep breaths. Recent CT of the chest suggested lymphogenic spread of cancer with mets in the visualized spine.  Also has had back pain and as mentioned, has mets in her spine.   Subjective: Very upset about her ongoing cough, cannot sleep, chest and back hurt a great deal. Has not had any relief so far with medications.   Assessment & Plan:   Principal Problem:   Acute respiratory failure with hypoxia  - likely due to metastatic cancer although underlying infection cannot be completely occluded- has not responded to Levaquin - cont O2 - Tussionex and Tessalon for cough - have contacted oncology and pulmonary  Active Problems:   Breast cancer of upper-outer quadrant of left female breast   - as above, oncology to consult  Dehydration - cont slow IVF   Back pain with metastasis - Dilaudid and Percocet    Diabetes mellitus without complication  - sliding scale insulin    Protein-calorie malnutrition, moderate  - supplements ordered  DVT prophylaxis: Lovenox Code Status: Full Family Communication:  Disposition Plan: to be determined Consultants:   Oncology  Pulmonary  Procedures:  None  Antimicrobials:  Anti-infectives    None       Objective: Vitals:   06/07/16 0202 06/07/16 0351 06/07/16 0431 06/07/16 1320  BP: 153/95 145/91 (!) 164/92 (!) 157/95  Pulse: 92 110 92 99  Resp: '23 20 18 20  '$ Temp: 98.6 F (37 C) 98.6 F (37 C) 98.5 F (36.9 C) 98.8 F (37.1 C)  TempSrc: Oral Oral Oral Oral  SpO2: 98% 98% 93% 95%  Weight:   72.1 kg (158 lb 15.2  oz)   Height:   '5\' 10"'$  (1.778 m)     Intake/Output Summary (Last 24 hours) at 06/07/16 1414 Last data filed at 06/07/16 1301  Gross per 24 hour  Intake              480 ml  Output                0 ml  Net              480 ml   Filed Weights   06/06/16 2144 06/07/16 0431  Weight: 69.9 kg (154 lb) 72.1 kg (158 lb 15.2 oz)    Examination: General exam: Appears uncomfortable with cough and pain HEENT: PERRLA, oral mucosa moist, no sclera icterus or thrush Respiratory system: decreased breath sounds-  Respiratory effort normal- significant cough Cardiovascular system: S1 & S2 heard, RRR.  No murmurs  Gastrointestinal system: Abdomen soft, non-tender, nondistended. Normal bowel sound. No organomegaly Central nervous system: Alert and oriented. No focal neurological deficits. Extremities: No cyanosis, clubbing or edema Skin: No rashes or ulcers Psychiatry:  Mood & affect appropriate.     Data Reviewed: I have personally reviewed following labs and imaging studies  CBC:  Recent Labs Lab 06/07/16 0100 06/07/16 0105  WBC 10.6*  --   NEUTROABS 8.1*  --   HGB 11.0* 11.9*  HCT 33.2* 35.0*  MCV 86.5  --  PLT 446*  --    Basic Metabolic Panel:  Recent Labs Lab 06/07/16 0105  NA 138  K 3.9  CL 98*  GLUCOSE 146*  BUN 12  CREATININE 0.70   GFR: Estimated Creatinine Clearance: 93 mL/min (by C-G formula based on SCr of 0.8 mg/dL). Liver Function Tests: No results for input(s): AST, ALT, ALKPHOS, BILITOT, PROT, ALBUMIN in the last 168 hours. No results for input(s): LIPASE, AMYLASE in the last 168 hours. No results for input(s): AMMONIA in the last 168 hours. Coagulation Profile: No results for input(s): INR, PROTIME in the last 168 hours. Cardiac Enzymes: No results for input(s): CKTOTAL, CKMB, CKMBINDEX, TROPONINI in the last 168 hours. BNP (last 3 results) No results for input(s): PROBNP in the last 8760 hours. HbA1C: No results for input(s): HGBA1C in the last 72  hours. CBG:  Recent Labs Lab 06/07/16 0737 06/07/16 1144  GLUCAP 139* 206*   Lipid Profile: No results for input(s): CHOL, HDL, LDLCALC, TRIG, CHOLHDL, LDLDIRECT in the last 72 hours. Thyroid Function Tests: No results for input(s): TSH, T4TOTAL, FREET4, T3FREE, THYROIDAB in the last 72 hours. Anemia Panel: No results for input(s): VITAMINB12, FOLATE, FERRITIN, TIBC, IRON, RETICCTPCT in the last 72 hours. Urine analysis:    Component Value Date/Time   COLORURINE YELLOW 06/03/2014 1050   APPEARANCEUR CLEAR 06/03/2014 1050   LABSPEC >1.030 (H) 06/03/2014 1050   PHURINE 6.0 06/03/2014 1050   GLUCOSEU NEGATIVE 06/03/2014 1050   HGBUR SMALL (A) 06/03/2014 1050   BILIRUBINUR SMALL (A) 06/03/2014 1050   KETONESUR NEGATIVE 06/03/2014 1050   PROTEINUR NEGATIVE 06/03/2014 1050   UROBILINOGEN 0.2 06/03/2014 1050   NITRITE NEGATIVE 06/03/2014 1050   LEUKOCYTESUR TRACE (A) 06/03/2014 1050   Sepsis Labs: '@LABRCNTIP'$ (procalcitonin:4,lacticidven:4) )No results found for this or any previous visit (from the past 240 hour(s)).       Radiology Studies: Dg Chest 2 View  Result Date: 06/06/2016 CLINICAL DATA:  Acute onset of shortness of breath. Initial encounter. EXAM: CHEST  2 VIEW COMPARISON:  Chest radiograph and CTA of the chest performed 05/19/2016 FINDINGS: The lungs are well-aerated. Vascular congestion is noted. Mild bibasilar opacities may reflect minimal interstitial edema. There is no evidence of pleural effusion or pneumothorax. The heart is mildly enlarged. No acute osseous abnormalities are seen. Scattered clips are seen at the left axilla. IMPRESSION: Vascular congestion and mild cardiomegaly. Mild bibasilar opacities may reflect minimal interstitial edema. Electronically Signed   By: Garald Balding M.D.   On: 06/06/2016 23:13   Ct Angio Chest Pe W And/or Wo Contrast  Result Date: 06/07/2016 CLINICAL DATA:  Subacute onset of nonproductive cough, fever and nausea. 21 pounds of  weight loss. Generalized back and chest pain. Shortness of breath with exertion. Mild leukocytosis. Initial encounter. EXAM: CT ANGIOGRAPHY CHEST WITH CONTRAST TECHNIQUE: Multidetector CT imaging of the chest was performed using the standard protocol during bolus administration of intravenous contrast. Multiplanar CT image reconstructions and MIPs were obtained to evaluate the vascular anatomy. CONTRAST:  100 mL of Isovue 300 IV contrast COMPARISON:  CTA of the chest performed 05/19/2016, and chest radiograph performed 06/06/2016 FINDINGS: There is no evidence of pulmonary embolus. Small bilateral pleural effusions are noted. Note is made of vague soft tissue density tracking about the right hilum, measuring approximately 3.7 x 3.7 x 2.0 cm, with mild mass effect on the right-sided pulmonary arteries and right-sided bronchioles. This raises concern for malignancy. Interstitial prominence is noted, with bibasilar airspace opacities, raising concern for pulmonary edema, though  underlying pneumonia cannot be excluded. Underlying fissural nodularity on the right side could reflect lymphangitic carcinomatosis, as previously noted. Peripheral scarring is noted. No pneumothorax is identified. Trace pericardial fluid is noted. This may be within normal limits, though malignant pericardial effusion cannot be excluded. No definite mediastinal lymphadenopathy is seen. Vague soft tissue density tracks about the subcarinal region and azygoesophageal recess. The great vessels are grossly unremarkable in appearance. No axillary lymphadenopathy is seen. The visualized portions of the thyroid gland are unremarkable in appearance. There is diffuse skin thickening and irregularity along the left breast. Would correlate for any evidence of inflammatory breast cancer. Postoperative change and a small postoperative fluid collection measuring 2.8 cm are noted underlying the left nipple, with retraction of the left nipple. The visualized  portions of the liver and spleen are unremarkable. Numerous lytic lesions are noted throughout the visualized thoracic and upper lumbar spine, concerning for metastatic disease. A small sclerotic focus is also noted at T5. Review of the MIP images confirms the above findings. IMPRESSION: 1. No evidence of pulmonary embolus. 2. Small bilateral pleural effusions noted. 3. Vague soft tissue density tracking about the right hilum, measuring approximately 3.7 x 3.7 x 2.0 cm, with mild mass effect on the right-sided pulmonary arteries and right-sided bronchioles. This raises concern for malignancy. Underlying right-sided pleural nodularity could reflect lymphangitic carcinomatosis, as previously noted. 4. Interstitial prominence, with bibasilar airspace opacities, concerning for pulmonary edema, though underlying pneumonia cannot be excluded. 5. Trace pericardial fluid may remain within normal limits, though malignant pericardial effusion cannot be excluded. Vague soft tissue density tracks about the subcarinal region and azygoesophageal recess, concerning for extension of malignancy. 6. Diffuse skin thickening and irregularity along the left breast. Would correlate for any evidence of inflammatory breast cancer. Underlying postoperative change and small postoperative fluid collection measuring 2.8 cm, with retraction of the left nipple. 7. Numerous lytic lesions throughout the visualized thoracic and upper lumbar spine, concerning for metastatic disease. Small sclerotic focus also noted at T5. Electronically Signed   By: Garald Balding M.D.   On: 06/07/2016 04:34      Scheduled Meds: . sodium chloride   Intravenous STAT  . benzonatate  200 mg Oral TID  . chlorpheniramine-HYDROcodone  5 mL Oral Q12H  . enoxaparin (LOVENOX) injection  40 mg Subcutaneous Q24H  . feeding supplement (ENSURE ENLIVE)  237 mL Oral BID BM  . insulin aspart  0-5 Units Subcutaneous QHS  . insulin aspart  0-9 Units Subcutaneous TID WC    . multivitamin with minerals  1 tablet Oral Daily  . sodium chloride flush  3 mL Intravenous Q12H  . tamoxifen  20 mg Oral Daily   Continuous Infusions:    LOS: 0 days    Time spent in minutes: 41    [place], MD [facility] Pager: [email or web address] Password [email or web address] 06/07/2016, 2:14 PM

## 2016-06-07 NOTE — Consult Note (Signed)
[place]  [phone] [phone]     ID: [name] DOB: 1966-01-27  MR#: [id number]  XQJ#:[id number]  Patient Care Team: [name], MD as PCP - General (Family Medicine) Stark Klein, MD as Consulting Physician (General Surgery) Chauncey Cruel, MD as Consulting Physician (Oncology) Thea Silversmith, MD as Consulting Physician (Radiation Oncology) [facility] Bunnie Pion, NP as Nurse Practitioner (Nurse Practitioner) PCP: [name], MD Chauncey Cruel, MD GYN: SU:  OTHER MD:  CHIEF COMPLAINT: cough, uncontrolled pain  CURRENT TREATMENT: workup for likely stage IV cancer  INTERVAL HISTORY: Kniyah's history of breast cancer is detailed below, but in brief: she was diagnosed with stage III, estrogen receptor positive disease July 2015, underwent surgery, adjuvant chemotherapy and radiation. I last saw her 06/30/2016 at which time she was started on tamoxifen. She did not show for appointments in Sept and Nov but did have diagnostic mammography 01/06/2016 showing post-op and post-XRT changes (including skin thickening). There was a 0.4 cm area of calcifications felt to be probably benign and she was set up for 48-monthfollow-up.  More recently she presented to the ED 01/13/2016 after an episode of syncope. She recovered quickly and no specific cause was ascertained. On 05/19/2016 she again presented to the ED complaining of rib pain and cough. CXR and R rib films were negative. However CT/angio of the chest the next day, while showing no PE, raised suspicion for possible pumonary edema, infection, or carcinomatosis. She was started on zithromax and called our office--an appointment was made for 06/10/2016 (after I returned to town) but the patient was worked in to see out NP 05/25/2016. Levaquin was added and an inhaler and her pain medication was refilled.On 05/31/2016 the patient was called on follow-up and reported improvement in her symptoms,  but on 06/06/2016 she again presented to the ED with c/o worsening cough, N/V and uncontrolled pain. CXR sugested vascular congestion but repeat CT angio of the chest 06/06/2016 showed in addition to numerous lytic bone lesions (not previously noted) small bilateral effusions, a 3.7 cm R hilar density, R sided pleural nodularity and interstitial prominence. Pulmonary has been consulted and they plan [place] in [place]   REVIEW OF SYSTEMS: KSumayahis distraught tonight. She understands she may have "lung cancer" and that this may take her life. She is bitter that this was not found sooner. She c/o significant pain despite her current meds. Denies constipation. Cough persists and she c/o pleurisy on the Right. No hemoptysis. Niece in room  BREAST CANCER HISTORY: From the original intake note:  The patient herself palpated a mass in her left breast and [facility] She palpated a large nontender mass at the 4:00 position as well as a small tender mass in the 6:00 position of the left breast. The patient was set up for imaging at the breast Center where on 05/05/2014 she underwent bilateral diagnostic mammography and left breast ultrasonography. The mammogram showed an irregular mass in the outer left breast with no other suspicious findings. This was firm and fixed at the 3:00 position approximately 10 cm from the nipple. Ultrasound showed a hypoechoic irregular solid mass in this area, measuring 1.8 cm. Ultrasound of the left axilla showed 2 suspicious level I lymph nodes, the largest measuring 1 cm, with a thickened pole. A smaller lymph node measured 0.6 cm but had no visible fatty hilum.  On 05/09/2014 the patient underwent biopsy of the left  breast mass (she refused biopsy of the left axilla) which showed ([id number]) and invasive ductal carcinoma, grade 2, estrogen receptor positive at 100%, with strong staining intensity, progesterone  receptor 80% positive, with weak staining intensity, with an MIB-1 of 80%, and no HER-2 amplification.  On 05/16/2014 the patient underwent bilateral breast MRI. This showed a 1.7 cm irregular enhancing mass at the 3:00 position of the left breast associated with the biopsy cleft. There were 3 level I left axillary lymph nodes with thickened cortices. The largest measured 1.3 cm. There were no other findings of concern.  Her subsequent history is as detailed below   PAST MEDICAL HISTORY: Past Medical History:  Diagnosis Date  . Anemia    low iron  . Arthritis   . Depression    pt denies   . Family history of malignant neoplasm of breast   . Head ache   . Hot flashes   . Lymphedema of breast    left  . Malignant neoplasm of breast (female), unspecified site    left breast  . Mental disorder    depression-  . Newly diagnosed diabetes 11/12/2014   chemo increasing blood glucose; not on meds now  . Radiation 04/22/15-06/23/15   Left Breast    PAST SURGICAL HISTORY: Past Surgical History:  Procedure Laterality Date  . BREAST LUMPECTOMY WITH SENTINEL LYMPH NODE BIOPSY Left 06/11/2014   Procedure: BREAST LUMPECTOMY WITH SENTINEL LYMPH NODE BX, POSSIBLE AXILLARY LYMPH NODE DISSECTION;  Surgeon: Stark Klein, MD;  Location: [place];  Service: General;  Laterality: Left;  . EVACUATION BREAST HEMATOMA Left 07/10/2014   Procedure: EVACUATION OF LEFT BREAST HEMATOMA ;  Surgeon: Stark Klein, MD;  Location: [place];  Service: General;  Laterality: Left;  . FOOT SURGERY  2011   rt  . INCISION AND DRAINAGE Left 07/02/2014   Procedure: Drainage left breast seroma;  Surgeon: Stark Klein, MD;  Location: [place];  Service: General;  Laterality: Left;  . INCISION AND DRAINAGE ABSCESS N/A 11/12/2014   Procedure: ASPIRATION AND INCISION AND DRAINAGE LEFT BREAST ABSCESS;  Surgeon: Michael Boston, MD;  Location: [facility];  Service: General;  Laterality: N/A;  . [place]   right  . PORT-A-CATH REMOVAL Left 07/22/2015   Procedure: REMOVAL PORT-A-CATH;  Surgeon: Stark Klein, MD;  Location: [place];  Service: General;  Laterality: Left;  . PORTACATH PLACEMENT N/A 07/02/2014   Procedure: INSERTION PORT-A-CATH;  Surgeon: Stark Klein, MD;  Location: [place];  Service: General;  Laterality: N/A;  . TUBAL LIGATION    . UTERINE FIBROID SURGERY  2005   ablation    FAMILY HISTORY Family History  Problem Relation Age of Onset  . Breast cancer Mother 28    currently 71; TAH/BSO d/t ?cervical ca at 27  . Lung cancer Father     deceased 82  . Breast cancer Maternal Aunt     dx 7s; currently in her late 26s  . Diabetes Neg Hx     the patient's father died at the age of 48 from lung cancer. The patient's mother was diagnosed with breast cancer at the age of 48. She survives. The patient had 2 brothers, and 2 sisters. One brother may have a diagnosis of "stomach cancer"  GYNECOLOGIC HISTORY:  No LMP recorded. Patient has had an ablation. Menarche age 43 which is also when the patient first had a child. She is GX P3. She stopped  having periods after laser ablation in 2005.  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 She has lived with her daughter [name] and her 4 children who are 20, 8, 5, and 4. 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 Currently the patient is living iwith [employer] who works as a Dietitian in [place]. The patient's daughter [name] works as a Scientist, water quality, also in [place]. The patient has 7 grandchildren. She is not a church attender    ADVANCED DIRECTIVES: not in place   HEALTH MAINTENANCE: Social History  Substance Use Topics  . Smoking status: Former Smoker    Packs/day: 0.25    Years: 20.00    Types: Cigarettes  . Smokeless tobacco: Never Used  . Alcohol use No     Comment: Percoset,Vicodin, Oxycotin     Allergies  Allergen  Reactions  . Hydrocodone Itching and Nausea Only    Current Facility-Administered Medications  Medication Dose Route Frequency Provider Last Rate Last Dose  . acetaminophen (TYLENOL) tablet 650 mg  650 mg Oral Q6H PRN Ivor Costa, MD       Or  . acetaminophen (TYLENOL) suppository 650 mg  650 mg Rectal Q6H PRN Ivor Costa, MD      . albuterol (PROVENTIL) (2.5 MG/3ML) 0.083% nebulizer solution 5 mg  5 mg Nebulization Q4H PRN Ivor Costa, MD      . benzonatate (TESSALON) capsule 200 mg  200 mg Oral TID Debbe Odea, MD   200 mg at 06/07/16 1738  . chlorpheniramine-HYDROcodone (TUSSIONEX) 10-8 MG/5ML suspension 5 mL  5 mL Oral Q12H Saima Rizwan, MD   5 mL at 06/07/16 1226  . enoxaparin (LOVENOX) injection 40 mg  40 mg Subcutaneous Q24H Ivor Costa, MD   40 mg at 06/07/16 0931  . feeding supplement (ENSURE ENLIVE) (ENSURE ENLIVE) liquid 237 mL  237 mL Oral BID BM Saima Rizwan, MD   237 mL at 06/07/16 1400  . furosemide (LASIX) tablet 20 mg  20 mg Oral BID Raylene Miyamoto, MD   20 mg at 06/07/16 1738  . insulin aspart (novoLOG) injection 0-5 Units  0-5 Units Subcutaneous QHS Ivor Costa, MD      . insulin aspart (novoLOG) injection 0-9 Units  0-9 Units Subcutaneous TID WC Ivor Costa, MD   1 Units at 06/07/16 1738  . ipratropium (ATROVENT) nebulizer solution 0.5 mg  0.5 mg Nebulization Q6H PRN Debbe Odea, MD      . LORazepam (ATIVAN) injection 1 mg  1 mg Intravenous Q6H PRN Debbe Odea, MD   1 mg at 06/07/16 1533  . multivitamin with minerals tablet 1 tablet  1 tablet Oral Daily Debbe Odea, MD   1 tablet at 06/07/16 1227  . oxyCODONE-acetaminophen (PERCOCET/ROXICET) 5-325 MG per tablet 2 tablet  2 tablet Oral Q6H PRN Ivor Costa, MD   2 tablet at 06/07/16 1933   And  . oxyCODONE (Oxy IR/ROXICODONE) immediate release tablet 10 mg  10 mg Oral Q6H PRN Ivor Costa, MD   10 mg at 06/07/16 1933  . sodium chloride flush (NS) 0.9 % injection 3 mL  3 mL Intravenous Q12H Ivor Costa, MD      . tamoxifen (NOLVADEX)  tablet 20 mg  20 mg Oral Daily Ivor Costa, MD   20 mg at 06/07/16 1694    OBJECTIVE: middler aged African American woman examined in bed Vitals:   06/07/16 0431 06/07/16 1320  BP: (!) 164/92 (!) 157/95  Pulse: 92 99  Resp: 18 20  Temp: 98.5 F (36.9 C) 98.8 F (37.1 C)     Body mass index is 22.81 kg/m.    Sclerae unicteric, EOMs intact No cervical or supraclavicular adenopathy Lungs no rales or rhonchi, auscultated anterolaterally Heart regular rate and rhythm Abd soft, nontender, positive bowel sounds MSK no upper extremity lymphedema Neuro: nonfocal, well oriented, depressed and angry affect Breasts: right breast is unremarkable. The left breast as s/p lumpectomy and radiation. There is skin thickening and hyperpigmentation, c/w history.     LAB RESULTS:  CMP     Component Value Date/Time   NA 139 06/07/2016 1543   NA 141 03/02/2015 1316   K 4.0 06/07/2016 1543   K 4.1 03/02/2015 1316   CL 105 06/07/2016 1543   CO2 29 06/07/2016 1543   CO2 27 03/02/2015 1316   GLUCOSE 125 (H) 06/07/2016 1543   GLUCOSE 91 03/02/2015 1316   BUN 10 06/07/2016 1543   BUN 10.4 03/02/2015 1316   CREATININE 0.36 (L) 06/07/2016 1543   CREATININE 0.6 03/02/2015 1316   CALCIUM 8.3 (L) 06/07/2016 1543   CALCIUM 9.4 03/02/2015 1316   PROT 7.3 03/02/2015 1316   ALBUMIN 4.1 03/02/2015 1316   AST 18 03/02/2015 1316   ALT 25 03/02/2015 1316   ALKPHOS 68 03/02/2015 1316   BILITOT 0.26 03/02/2015 1316   GFRNONAA >60 06/07/2016 1543   GFRAA >60 06/07/2016 1543    INo results found for: SPEP, UPEP  Lab Results  Component Value Date   WBC 10.6 (H) 06/07/2016   NEUTROABS 8.1 (H) 06/07/2016   HGB 11.9 (L) 06/07/2016   HCT 35.0 (L) 06/07/2016   MCV 86.5 06/07/2016   PLT 446 (H) 06/07/2016    '@LASTCHEMISTRY' @  No results found for: LABCA2  No components found for: LABCA125   Recent Labs Lab 06/07/16 1732  INR 1.07    Urinalysis    Component Value Date/Time   COLORURINE  YELLOW 06/03/2014 1050   APPEARANCEUR CLEAR 06/03/2014 1050   LABSPEC >1.030 (H) 06/03/2014 1050   PHURINE 6.0 06/03/2014 1050   GLUCOSEU NEGATIVE 06/03/2014 1050   HGBUR SMALL (A) 06/03/2014 1050   BILIRUBINUR SMALL (A) 06/03/2014 1050   KETONESUR NEGATIVE 06/03/2014 1050   PROTEINUR NEGATIVE 06/03/2014 1050   UROBILINOGEN 0.2 06/03/2014 1050   NITRITE NEGATIVE 06/03/2014 1050   LEUKOCYTESUR TRACE (A) 06/03/2014 1050     STUDIES: Dg Chest 2 View  Result Date: 06/06/2016 CLINICAL DATA:  Acute onset of shortness of breath. Initial encounter. EXAM: CHEST  2 VIEW COMPARISON:  Chest radiograph and CTA of the chest performed 05/19/2016 FINDINGS: The lungs are well-aerated. Vascular congestion is noted. Mild bibasilar opacities may reflect minimal interstitial edema. There is no evidence of pleural effusion or pneumothorax. The heart is mildly enlarged. No acute osseous abnormalities are seen. Scattered clips are seen at the left axilla. IMPRESSION: Vascular congestion and mild cardiomegaly. Mild bibasilar opacities may reflect minimal interstitial edema. Electronically Signed   By: Garald Balding M.D.   On: 06/06/2016 23:13   Dg Ribs Unilateral W/chest Right  Result Date: 05/19/2016 CLINICAL DATA:  Cough 3 weeks with anterior right rib pain extending to back. History of left breast cancer. EXAM: RIGHT RIBS AND CHEST - 3+ VIEW COMPARISON:  09/17/2014 FINDINGS: Lungs are adequately inflated with mild prominence of the perihilar markings. No focal lobar consolidation or effusion. No pneumothorax. Cardiomediastinal silhouette is within normal. Multiple surgical clips over the left lateral chest and axilla.  No definite right rib fracture. IMPRESSION: Suggestion of mild vascular congestion. No right rib fracture. Electronically Signed   By: Marin Olp M.D.   On: 05/19/2016 15:59   Ct Angio Chest Pe W And/or Wo Contrast  Result Date: 06/07/2016 CLINICAL DATA:  Subacute onset of nonproductive cough,  fever and nausea. 21 pounds of weight loss. Generalized back and chest pain. Shortness of breath with exertion. Mild leukocytosis. Initial encounter. EXAM: CT ANGIOGRAPHY CHEST WITH CONTRAST TECHNIQUE: Multidetector CT imaging of the chest was performed using the standard protocol during bolus administration of intravenous contrast. Multiplanar CT image reconstructions and MIPs were obtained to evaluate the vascular anatomy. CONTRAST:  100 mL of Isovue 300 IV contrast COMPARISON:  CTA of the chest performed 05/19/2016, and chest radiograph performed 06/06/2016 FINDINGS: There is no evidence of pulmonary embolus. Small bilateral pleural effusions are noted. Note is made of vague soft tissue density tracking about the right hilum, measuring approximately 3.7 x 3.7 x 2.0 cm, with mild mass effect on the right-sided pulmonary arteries and right-sided bronchioles. This raises concern for malignancy. Interstitial prominence is noted, with bibasilar airspace opacities, raising concern for pulmonary edema, though underlying pneumonia cannot be excluded. Underlying fissural nodularity on the right side could reflect lymphangitic carcinomatosis, as previously noted. Peripheral scarring is noted. No pneumothorax is identified. Trace pericardial fluid is noted. This may be within normal limits, though malignant pericardial effusion cannot be excluded. No definite mediastinal lymphadenopathy is seen. Vague soft tissue density tracks about the subcarinal region and azygoesophageal recess. The great vessels are grossly unremarkable in appearance. No axillary lymphadenopathy is seen. The visualized portions of the thyroid gland are unremarkable in appearance. There is diffuse skin thickening and irregularity along the left breast. Would correlate for any evidence of inflammatory breast cancer. Postoperative change and a small postoperative fluid collection measuring 2.8 cm are noted underlying the left nipple, with retraction of  the left nipple. The visualized portions of the liver and spleen are unremarkable. Numerous lytic lesions are noted throughout the visualized thoracic and upper lumbar spine, concerning for metastatic disease. A small sclerotic focus is also noted at T5. Review of the MIP images confirms the above findings. IMPRESSION: 1. No evidence of pulmonary embolus. 2. Small bilateral pleural effusions noted. 3. Vague soft tissue density tracking about the right hilum, measuring approximately 3.7 x 3.7 x 2.0 cm, with mild mass effect on the right-sided pulmonary arteries and right-sided bronchioles. This raises concern for malignancy. Underlying right-sided pleural nodularity could reflect lymphangitic carcinomatosis, as previously noted. 4. Interstitial prominence, with bibasilar airspace opacities, concerning for pulmonary edema, though underlying pneumonia cannot be excluded. 5. Trace pericardial fluid may remain within normal limits, though malignant pericardial effusion cannot be excluded. Vague soft tissue density tracks about the subcarinal region and azygoesophageal recess, concerning for extension of malignancy. 6. Diffuse skin thickening and irregularity along the left breast. Would correlate for any evidence of inflammatory breast cancer. Underlying postoperative change and small postoperative fluid collection measuring 2.8 cm, with retraction of the left nipple. 7. Numerous lytic lesions throughout the visualized thoracic and upper lumbar spine, concerning for metastatic disease. Small sclerotic focus also noted at T5. Electronically Signed   By: Garald Balding M.D.   On: 06/07/2016 04:34   Ct Angio Chest Pe W/cm &/or Wo Cm  Result Date: 05/20/2016 CLINICAL DATA:  Right chest pain and elevated D-dimer. EXAM: CT ANGIOGRAPHY CHEST WITH CONTRAST TECHNIQUE: Multidetector CT imaging of the chest was performed using the standard protocol  during bolus administration of intravenous contrast. Multiplanar CT image  reconstructions and MIPs were obtained to evaluate the vascular anatomy. CONTRAST:  80 cc Isovue 370 intravenous COMPARISON:  05/29/2014 head CT FINDINGS: Cardiovascular: Limited pulmonary artery CTA, especially beyond the segmental level. This appears secondary to transient interruption of contrast. No evidence of pulmonary embolism. Negative thoracic aorta. Normal heart size. No pericardial effusion. Mediastinum: Increased soft tissue in the right hilum without discrete measurable nodule. Lungs/Pleura: Diffuse Kerley lines consistent with mild edema. Small pleural effusion on the right without convincing nodularity. Subpleural reticulation left upper lobe is likely radiation fibrosis. There is asymmetric airway thickening on the right with thickened major and minor fissures around the right middle lobe that has nodular morphology and neighboring airspace opacity. Air trapping bilaterally. Upper abdomen: Hepatic steatosis. Mildly lobulated adrenal glands without discrete mass. Musculoskeletal: Lumpectomy site in the left breast with central fluid like density. Area measures 23 mm. Left breast is thick walled, potentially radiation related. Soft tissue nodularity, nonspecific, around left axillary clips. Patient is followed with mammography, most recently 01/06/2016. Review of the MIP images confirms the above findings. IMPRESSION: 1. Limited CTA of the pulmonary arteries. Negative for pulmonary embolism to the segmental level. 2. Mild pulmonary edema with small right pleural effusion. 3. Right-sided fissural nodularity and asymmetric hilar soft tissue is primarily concerning for lymphangitic carcinomatosis. Atypical infection is also considered in the acute setting. 4. Left axillary dissection with unexpected soft tissue around the surgical clips. 5. Suggest PET-CT follow-up after convalescence to evaluate #3 and #4. Electronically Signed   By: Monte Fantasia M.D.   On: 05/20/2016 00:03    ELIGIBLE FOR  AVAILABLE RESEARCH PROTOCOL: no  ASSESSMENT: 50 y.o. [place] woman status post left breast upper outer quadrant biopsy 05/09/2014 for a clinicalT1c N1, stage IIA invasive ductal carcinoma, grade 2, estrogen and progesterone receptor positive, HER-2 nonamplified, with an MIB-1 of 80%  (1) patient met with genetics counselor 05/29/2014 but decided against genetics testing   (2) tobacco abuse. The patient has been strongly urged to discontinue smoking. She was prescribed a nicotine patch 09/02/2014  (3) status post left lumpectomy and axillary lymph node dissection 06/11/2014 for a pT1c pN3, stage IIIC invasive ductal carcinoma, grade 3, with repeat HER-2 negative  (4) adjuvant chemotherapy consisting of doxorubicin and cyclophosphamide in dose dense fashion x4, completed 09/09/2014, followed by paclitaxel weekly x12 completed 03/09/2015  (5) adjuvant radiation 04/22/15-06/23/15 Left breast/ 45 Gy at 1.8 Gy per fraction x 25 fractions.  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 The bone lesions would be typical, the lung presentation less so--in my experience lymphangitic carcinomatosis is usually a late development, not the presenting stage IV finding. Accordingly the first step is to obtain a definitive diagnosis of the lung problem, and pulmonary is already planning thoracentesis, if feasible. If that does not yield a diagnosis bronchoscopy likely will follow.  If the lung findings are due to cancer there will be no need of bone biopsy.  If the diagnosis is cancer and it is breast  cancer treatment will depend on whether the tumor  is still estrogen receptor positive, and whether is had become HER-2 positive (as can occur up to 10% of cases). Acordingly we will need a breast prognostic panel from the pathology or cytology if malignancy is demonstrated.  I will add tumor markers to tomorrow's labs and will ask radiation oncology to consult w consideration of palliative treatment to painful bone areas. It will be useful to obtain a brain MRI this admission to complete her staging.  Appreciate your help to this patient! Will follow with you.   Chauncey Cruel, MD   06/07/2016 8:11 PM Medical Oncology and Hematology [facility] [address] [place], [place] [address] Tel. [phone]    Fax. [phone]

## 2016-06-07 NOTE — Consult Note (Signed)
Name: Laura Matthews MRN: [id number] DOB: 1966-01-25    ADMISSION DATE:  06/06/2016 CONSULTATION DATE:  8/8  REFERRING MD :  Dr. Wynelle Cleveland  CHIEF COMPLAINT:  Cough, back pain  HISTORY OF PRESENT ILLNESS:  50 year old female with PMH as below, which is significant for Breast Cancer stage IIIC invasive ductal carcinoma, grade 3, HER-2 negative. She is followed by Dr. [name] and has undergone left lumpectomy and axillary lymph node dissection 05/2014, adjuvant chemotherapy consisting of doxorubicin and cyclophosphamide followed by paclitaxel weekly x12 completed 03/09/2015. She then underwent radiation and was started on tamoxifen 07/01/2015, which she has been taking since that time. More recently her course has been complicated by cough, chest and back pain. She was initially seen the ED for this and thought to have bronchitis. She was diagnosed with a Z-pack. She followed up with oncology 7/26 and was still symptomatic, prescribed levaquin. She again presented to ED 8/7 with complaints of SOB, chest and back pain, and cough. Also a recent 28 pound weight loss. She underwent a CT angiogram of the chest, which demonstrated concerns for metastatic disease including soft tissue density in the R hilum. PCCM asked to see for potential biopsy.    SIGNIFICANT EVENTS    STUDIES:   CTA chest 8/7 > Vague soft tissue density tracking about the right hilum, measuring approximately 3.7 x 3.7 x 2.0 cm, with mild mass effect on the right-sided pulmonary arteries and right-sided bronchioles. Numerous lytic lesions throuhogut the thoracic and upper lumbar spine. Underlying right-sided pleural nodularity. Small pleural and trace pericardial effusions.    PAST MEDICAL HISTORY :   has a past medical history of Anemia; Arthritis; Depression; Family history of malignant neoplasm of breast; Head ache; Hot flashes; Lymphedema of breast; Malignant neoplasm of breast (female), unspecified site; Mental disorder; Newly  diagnosed diabetes (11/12/2014); and Radiation (04/22/15-06/23/15).  has a past surgical history that includes Knee surgery (1995); Foot surgery (2011); Tubal ligation; Uterine fibroid surgery (2005); Breast lumpectomy with sentinel lymph node bx (Left, 06/11/2014); Portacath placement (N/A, 07/02/2014); Incision and drainage (Left, 07/02/2014); Evacuation breast hematoma (Left, 07/10/2014); Incision and drainage abscess (N/A, 11/12/2014); and Port-a-cath removal (Left, 07/22/2015). Prior to Admission medications   Medication Sig Start Date End Date Taking? Authorizing Provider  albuterol (PROVENTIL HFA;VENTOLIN HFA) 108 (90 Base) MCG/ACT inhaler Inhale 1-2 puffs into the lungs every 6 (six) hours as needed for wheezing or shortness of breath. 05/25/16  Yes Susanne Borders, NP  brompheniramine-pseudoephedrine-DM 30-2-10 MG/5ML syrup Take 30 mLs by mouth 4 (four) times daily as needed (cough).   Yes Historical Provider, MD  glipiZIDE (GLUCOTROL XL) 5 MG 24 hr tablet TAKE ONE TABLET BY MOUTH TWICE DAILY AS DIRECTED 04/14/16  Yes Historical Provider, MD  oxyCODONE-acetaminophen (PERCOCET) 10-325 MG tablet Take 1 tablet by mouth four times a day as needed for pain 05/25/16  Yes Susanne Borders, NP  tamoxifen (NOLVADEX) 20 MG tablet Take 20 mg by mouth daily. 11/24/15  Yes Historical Provider, MD  gabapentin (NEURONTIN) 300 MG capsule Take 1 capsule (300 mg total) by mouth at bedtime. Patient not taking: Reported on 06/06/2016 07/01/15   Chauncey Cruel, MD  levofloxacin (LEVAQUIN) 500 MG tablet Take 1 tablet (500 mg total) by mouth daily. Patient not taking: Reported on 06/06/2016 05/25/16   Susanne Borders, NP   Allergies  Allergen Reactions  . Hydrocodone Itching and Nausea Only    FAMILY HISTORY:  family history includes Breast cancer in her maternal  aunt; Breast cancer (age of onset: 72) in her mother; Lung cancer in her father. SOCIAL HISTORY:  reports that she has quit smoking. Her smoking use included  Cigarettes. She has a 5.00 pack-year smoking history. She has never used smokeless tobacco. She reports that she uses drugs, including Oxycodone and Other-see comments. She reports that she does not drink alcohol.  REVIEW OF SYSTEMS:    Constitutional: POS weight loss, neg gain, night sweats, mild Fevers, chills, fatigue .  HEENT: neg headaches, Sore throat, sneezing, nasal congestion, post nasal drip, Difficulty swallowing, Tooth/dental problems, visual complaints visual changes, ear ache CV:  Pos chest pain, radiates none: ,Orthopnea, PND, swelling in lower extremities, dizziness, palpitations, syncope.  GI  No heartburn, indigestion, abdominal pain, nausea, vomiting, diarrhea, change in bowel habits, loss of appetite, bloody stools.  Resp: POS cough,non productive: , hemoptysis, dyspnea, back  pain  Skin: no rash or itching or icterus GU: no dysuria, change in color of urine, urgency or frequency. flank pain, hematuria  MS: no joint pain or swelling. decreased range of motion  Psych:non  change in mood or affect. depression or anxiety.  Neuro: non difficulty with speech, weakness, numbness, ataxia    SUBJECTIVE:   VITAL SIGNS: Temp:  [98.5 F (36.9 C)-99.1 F (37.3 C)] 98.8 F (37.1 C) (08/08 1320) Pulse Rate:  [92-110] 99 (08/08 1320) Resp:  [18-23] 20 (08/08 1320) BP: (131-164)/(91-97) 157/95 (08/08 1320) SpO2:  [91 %-98 %] 95 % (08/08 1320) Weight:  [69.9 kg (154 lb)-72.1 kg (158 lb 15.2 oz)] 72.1 kg (158 lb 15.2 oz) (08/08 0431)  PHYSICAL EXAMINATION: General:  Awake, alert, mild pain Neuro:  Nonfocal, perrl  HEENT:  jvd wnl, no nodes neck or axillary Back: no swelling, some point tenderness mild lower thoracic Cardiovascular:  s1 s2 RRR distant Lungs:  Coarse mild, mild end exp wheezing clears easily rt upper zone Abdomen:  Soft, BS wnl, no r/g Musculoskeletal:  No edema, no rash   Recent Labs Lab 06/07/16 0105  NA 138  K 3.9  CL 98*  BUN 12  CREATININE 0.70    GLUCOSE 146*    Recent Labs Lab 06/07/16 0100 06/07/16 0105  HGB 11.0* 11.9*  HCT 33.2* 35.0*  WBC 10.6*  --   PLT 446*  --    Dg Chest 2 View  Result Date: 06/06/2016 CLINICAL DATA:  Acute onset of shortness of breath. Initial encounter. EXAM: CHEST  2 VIEW COMPARISON:  Chest radiograph and CTA of the chest performed 05/19/2016 FINDINGS: The lungs are well-aerated. Vascular congestion is noted. Mild bibasilar opacities may reflect minimal interstitial edema. There is no evidence of pleural effusion or pneumothorax. The heart is mildly enlarged. No acute osseous abnormalities are seen. Scattered clips are seen at the left axilla. IMPRESSION: Vascular congestion and mild cardiomegaly. Mild bibasilar opacities may reflect minimal interstitial edema. Electronically Signed   By: Garald Balding M.D.   On: 06/06/2016 23:13   Ct Angio Chest Pe W And/or Wo Contrast  Result Date: 06/07/2016 CLINICAL DATA:  Subacute onset of nonproductive cough, fever and nausea. 21 pounds of weight loss. Generalized back and chest pain. Shortness of breath with exertion. Mild leukocytosis. Initial encounter. EXAM: CT ANGIOGRAPHY CHEST WITH CONTRAST TECHNIQUE: Multidetector CT imaging of the chest was performed using the standard protocol during bolus administration of intravenous contrast. Multiplanar CT image reconstructions and MIPs were obtained to evaluate the vascular anatomy. CONTRAST:  100 mL of Isovue 300 IV contrast COMPARISON:  CTA of the  chest performed 05/19/2016, and chest radiograph performed 06/06/2016 FINDINGS: There is no evidence of pulmonary embolus. Small bilateral pleural effusions are noted. Note is made of vague soft tissue density tracking about the right hilum, measuring approximately 3.7 x 3.7 x 2.0 cm, with mild mass effect on the right-sided pulmonary arteries and right-sided bronchioles. This raises concern for malignancy. Interstitial prominence is noted, with bibasilar airspace opacities,  raising concern for pulmonary edema, though underlying pneumonia cannot be excluded. Underlying fissural nodularity on the right side could reflect lymphangitic carcinomatosis, as previously noted. Peripheral scarring is noted. No pneumothorax is identified. Trace pericardial fluid is noted. This may be within normal limits, though malignant pericardial effusion cannot be excluded. No definite mediastinal lymphadenopathy is seen. Vague soft tissue density tracks about the subcarinal region and azygoesophageal recess. The great vessels are grossly unremarkable in appearance. No axillary lymphadenopathy is seen. The visualized portions of the thyroid gland are unremarkable in appearance. There is diffuse skin thickening and irregularity along the left breast. Would correlate for any evidence of inflammatory breast cancer. Postoperative change and a small postoperative fluid collection measuring 2.8 cm are noted underlying the left nipple, with retraction of the left nipple. The visualized portions of the liver and spleen are unremarkable. Numerous lytic lesions are noted throughout the visualized thoracic and upper lumbar spine, concerning for metastatic disease. A small sclerotic focus is also noted at T5. Review of the MIP images confirms the above findings. IMPRESSION: 1. No evidence of pulmonary embolus. 2. Small bilateral pleural effusions noted. 3. Vague soft tissue density tracking about the right hilum, measuring approximately 3.7 x 3.7 x 2.0 cm, with mild mass effect on the right-sided pulmonary arteries and right-sided bronchioles. This raises concern for malignancy. Underlying right-sided pleural nodularity could reflect lymphangitic carcinomatosis, as previously noted. 4. Interstitial prominence, with bibasilar airspace opacities, concerning for pulmonary edema, though underlying pneumonia cannot be excluded. 5. Trace pericardial fluid may remain within normal limits, though malignant pericardial effusion  cannot be excluded. Vague soft tissue density tracks about the subcarinal region and azygoesophageal recess, concerning for extension of malignancy. 6. Diffuse skin thickening and irregularity along the left breast. Would correlate for any evidence of inflammatory breast cancer. Underlying postoperative change and small postoperative fluid collection measuring 2.8 cm, with retraction of the left nipple. 7. Numerous lytic lesions throughout the visualized thoracic and upper lumbar spine, concerning for metastatic disease. Small sclerotic focus also noted at T5. Electronically Signed   By: Garald Balding M.D.   On: 06/07/2016 04:34    ASSESSMENT / PLAN:  Breast Ca now with likely metastasis on CT chest Pleural effusion, r/o malignant Mets to spine likely SOB from above R/o lymphangitic spread Unlikely infectious  CT 2 weeks ago with fissure area rt interestingly enough is gone but now thickening and worsening int changes remain Her wt loss 27 pounds, CT findings and lytic lesions are highly suspicious for reoccurrence She requires diagnostic and therapeutic attempt at effusion first, get coags now to be able to do in am  Will [place] chest in am then decide if amenable to safe thora by pccm Start lasix oral to neg 1 liter goal, get chem in am  Unlikely infection, s/p courses of atypical coverage, hold further abx, get pct x 1 Re assess echo with volume noted on CT   Hope that thora could improve SOB and symptoms, so it is needed for therapeutic but assume that yield is neg for cytology will need To discuss with oncology  why we would need repeat bx with spine lytic lesions noted after known cancer recent ( I attempted to call oncology office)  Repeat pcxr in am  If clinical c/w lymphangitic spread would start steroids in setting continued cough, SOB and lyic spine lesions (low threshold to start) Seems she will need oncology consult in house, radiation also>? For spine?  Will follow up in m a If  coughing worsening would start solumedral 40 bid  Lavon Paganini. Titus Mould, MD, [place] Pgr: [facility]  Pulmonary and [place] Pager: [phone]  06/07/2016, 4:13 PM

## 2016-06-07 NOTE — ED Notes (Signed)
Timer started

## 2016-06-08 ENCOUNTER — Observation Stay (HOSPITAL_COMMUNITY): Payer: Medicaid Other

## 2016-06-08 ENCOUNTER — Other Ambulatory Visit: Payer: Self-pay

## 2016-06-08 ENCOUNTER — Observation Stay (HOSPITAL_BASED_OUTPATIENT_CLINIC_OR_DEPARTMENT_OTHER): Payer: Medicaid Other

## 2016-06-08 ENCOUNTER — Ambulatory Visit
Admit: 2016-06-08 | Discharge: 2016-06-08 | Disposition: A | Discharge status: Home / Self Care | Payer: Medicaid Other | Source: Ambulatory Visit | Attending: Radiation Oncology | Admitting: Radiation Oncology

## 2016-06-08 DIAGNOSIS — C50919 Malignant neoplasm of unspecified site of unspecified female breast: Secondary | ICD-10-CM | POA: Diagnosis not present

## 2016-06-08 DIAGNOSIS — Z9889 Other specified postprocedural states: Secondary | ICD-10-CM

## 2016-06-08 DIAGNOSIS — Z510 Encounter for antineoplastic radiation therapy: Secondary | ICD-10-CM | POA: Insufficient documentation

## 2016-06-08 DIAGNOSIS — R918 Other nonspecific abnormal finding of lung field: Secondary | ICD-10-CM | POA: Diagnosis not present

## 2016-06-08 DIAGNOSIS — I891 Lymphangitis: Secondary | ICD-10-CM | POA: Diagnosis not present

## 2016-06-08 DIAGNOSIS — C7951 Secondary malignant neoplasm of bone: Secondary | ICD-10-CM

## 2016-06-08 DIAGNOSIS — Z8049 Family history of malignant neoplasm of other genital organs: Secondary | ICD-10-CM | POA: Diagnosis not present

## 2016-06-08 DIAGNOSIS — C3490 Malignant neoplasm of unspecified part of unspecified bronchus or lung: Secondary | ICD-10-CM | POA: Diagnosis not present

## 2016-06-08 DIAGNOSIS — M545 Low back pain, unspecified: Secondary | ICD-10-CM

## 2016-06-08 DIAGNOSIS — Z7984 Long term (current) use of oral hypoglycemic drugs: Secondary | ICD-10-CM | POA: Insufficient documentation

## 2016-06-08 DIAGNOSIS — C773 Secondary and unspecified malignant neoplasm of axilla and upper limb lymph nodes: Secondary | ICD-10-CM | POA: Diagnosis not present

## 2016-06-08 DIAGNOSIS — Z79899 Other long term (current) drug therapy: Secondary | ICD-10-CM | POA: Insufficient documentation

## 2016-06-08 DIAGNOSIS — Z801 Family history of malignant neoplasm of trachea, bronchus and lung: Secondary | ICD-10-CM | POA: Diagnosis not present

## 2016-06-08 DIAGNOSIS — Z803 Family history of malignant neoplasm of breast: Secondary | ICD-10-CM | POA: Diagnosis not present

## 2016-06-08 DIAGNOSIS — Z87891 Personal history of nicotine dependence: Secondary | ICD-10-CM | POA: Diagnosis not present

## 2016-06-08 DIAGNOSIS — C3480 Malignant neoplasm of overlapping sites of unspecified bronchus and lung: Secondary | ICD-10-CM

## 2016-06-08 DIAGNOSIS — Z885 Allergy status to narcotic agent status: Secondary | ICD-10-CM | POA: Insufficient documentation

## 2016-06-08 DIAGNOSIS — C7801 Secondary malignant neoplasm of right lung: Secondary | ICD-10-CM | POA: Diagnosis present

## 2016-06-08 DIAGNOSIS — R0600 Dyspnea, unspecified: Secondary | ICD-10-CM

## 2016-06-08 DIAGNOSIS — R52 Pain, unspecified: Secondary | ICD-10-CM | POA: Diagnosis not present

## 2016-06-08 DIAGNOSIS — Z79811 Long term (current) use of aromatase inhibitors: Secondary | ICD-10-CM | POA: Insufficient documentation

## 2016-06-08 DIAGNOSIS — C7931 Secondary malignant neoplasm of brain: Secondary | ICD-10-CM | POA: Diagnosis present

## 2016-06-08 DIAGNOSIS — R0602 Shortness of breath: Secondary | ICD-10-CM | POA: Diagnosis present

## 2016-06-08 DIAGNOSIS — E43 Unspecified severe protein-calorie malnutrition: Secondary | ICD-10-CM | POA: Diagnosis present

## 2016-06-08 DIAGNOSIS — Z923 Personal history of irradiation: Secondary | ICD-10-CM | POA: Diagnosis not present

## 2016-06-08 DIAGNOSIS — C50412 Malignant neoplasm of upper-outer quadrant of left female breast: Secondary | ICD-10-CM | POA: Diagnosis present

## 2016-06-08 DIAGNOSIS — J90 Pleural effusion, not elsewhere classified: Secondary | ICD-10-CM

## 2016-06-08 DIAGNOSIS — Z9221 Personal history of antineoplastic chemotherapy: Secondary | ICD-10-CM | POA: Diagnosis not present

## 2016-06-08 DIAGNOSIS — C7800 Secondary malignant neoplasm of unspecified lung: Secondary | ICD-10-CM | POA: Diagnosis not present

## 2016-06-08 DIAGNOSIS — Z170 Estrogen receptor positive status [ER+]: Secondary | ICD-10-CM | POA: Insufficient documentation

## 2016-06-08 DIAGNOSIS — Z79810 Long term (current) use of selective estrogen receptor modulators (SERMs): Secondary | ICD-10-CM | POA: Diagnosis not present

## 2016-06-08 DIAGNOSIS — D649 Anemia, unspecified: Secondary | ICD-10-CM | POA: Diagnosis present

## 2016-06-08 DIAGNOSIS — Z833 Family history of diabetes mellitus: Secondary | ICD-10-CM | POA: Diagnosis not present

## 2016-06-08 DIAGNOSIS — M899 Disorder of bone, unspecified: Secondary | ICD-10-CM | POA: Diagnosis not present

## 2016-06-08 DIAGNOSIS — F419 Anxiety disorder, unspecified: Secondary | ICD-10-CM | POA: Diagnosis present

## 2016-06-08 DIAGNOSIS — E119 Type 2 diabetes mellitus without complications: Secondary | ICD-10-CM | POA: Diagnosis present

## 2016-06-08 DIAGNOSIS — E860 Dehydration: Secondary | ICD-10-CM | POA: Diagnosis present

## 2016-06-08 DIAGNOSIS — J9601 Acute respiratory failure with hypoxia: Secondary | ICD-10-CM

## 2016-06-08 DIAGNOSIS — J910 Malignant pleural effusion: Secondary | ICD-10-CM | POA: Diagnosis present

## 2016-06-08 LAB — BASIC METABOLIC PANEL
ANION GAP: 8 (ref 5–15)
BUN: 8 mg/dL (ref 6–20)
CALCIUM: 8.9 mg/dL (ref 8.9–10.3)
CO2: 28 mmol/L (ref 22–32)
Chloride: 101 mmol/L (ref 101–111)
Creatinine, Ser: 0.4 mg/dL — ABNORMAL LOW (ref 0.44–1.00)
GFR calc Af Amer: >60 mL/min (ref >60)
GLUCOSE: 146 mg/dL — AB (ref 65–99)
POTASSIUM: 3.6 mmol/L (ref 3.5–5.1)
SODIUM: 137 mmol/L (ref 135–145)

## 2016-06-08 LAB — GLUCOSE, CAPILLARY
GLUCOSE-CAPILLARY: 251 mg/dL — AB (ref 65–99)
GLUCOSE-CAPILLARY: 125 mg/dL — AB (ref 65–99)
GLUCOSE-CAPILLARY: 203 mg/dL — AB (ref 65–99)
Glucose-Capillary: 185 mg/dL — ABNORMAL HIGH (ref 65–99)
Glucose-Capillary: 136 mg/dL — ABNORMAL HIGH (ref 65–99)

## 2016-06-08 LAB — LACTATE DEHYDROGENASE, PLEURAL OR PERITONEAL FLUID: LD, Fluid: 180 U/L — ABNORMAL HIGH (ref 3–23)

## 2016-06-08 LAB — BODY FLUID CELL COUNT WITH DIFFERENTIAL
LYMPHS FL: 88 %
MONOCYTE-MACROPHAGE-SEROUS FLUID: 8 % — AB (ref 50–90)
NEUTROPHIL FLUID: 4 % (ref 0–25)
Total Nucleated Cell Count, Fluid: 674 cu mm (ref 0–1000)

## 2016-06-08 LAB — ECHOCARDIOGRAM COMPLETE
HEIGHTINCHES: 70 in
Weight: 2543.23 oz

## 2016-06-08 LAB — PROTEIN, TOTAL: Total Protein: 6.7 g/dL (ref 6.5–8.1)

## 2016-06-08 LAB — MAGNESIUM: Magnesium: 1.8 mg/dL (ref 1.7–2.4)

## 2016-06-08 LAB — GLUCOSE, SEROUS FLUID: GLUCOSE FL: 170 mg/dL

## 2016-06-08 LAB — LACTATE DEHYDROGENASE: LDH: 190 U/L (ref 98–192)

## 2016-06-08 LAB — PROTEIN, BODY FLUID: Total protein, fluid: <3 g/dL

## 2016-06-08 LAB — CHOLESTEROL, TOTAL: CHOLESTEROL: 177 mg/dL (ref 0–200)

## 2016-06-08 LAB — PHOSPHORUS: PHOSPHORUS: 3.9 mg/dL (ref 2.5–4.6)

## 2016-06-08 MED ORDER — HYDROMORPHONE HCL 1 MG/ML IJ SOLN
1.0000 mg | INTRAMUSCULAR | Status: DC | PRN
  Administered 2016-06-08 – 2016-06-12 (×25): 2 mg via INTRAVENOUS
  Filled 2016-06-08 (×9): qty 2
  Filled 2016-06-08: qty 1
  Filled 2016-06-08 (×16): qty 2

## 2016-06-08 MED ORDER — ZOLPIDEM TARTRATE 5 MG PO TABS
5.0000 mg | ORAL_TABLET | Freq: Every evening | ORAL | Status: DC | PRN
  Administered 2016-06-08 – 2016-06-10 (×2): 5 mg via ORAL
  Filled 2016-06-08 (×2): qty 1

## 2016-06-08 MED ORDER — KETOROLAC TROMETHAMINE 30 MG/ML IJ SOLN
30.0000 mg | Freq: Four times a day (QID) | INTRAMUSCULAR | Status: DC
  Administered 2016-06-08 – 2016-06-09 (×5): 30 mg via INTRAVENOUS
  Filled 2016-06-08 (×4): qty 1

## 2016-06-08 MED ORDER — KETOROLAC TROMETHAMINE 30 MG/ML IJ SOLN
INTRAMUSCULAR | Status: AC
  Filled 2016-06-08: qty 1

## 2016-06-08 NOTE — Progress Notes (Signed)
Echocardiogram 2D Echocardiogram has been performed.  Joelene Millin 06/08/2016, 3:55 PM

## 2016-06-08 NOTE — Progress Notes (Signed)
PROGRESS NOTE    Laura Matthews  FTD:[id number] DOB: 1966/01/15 DOA: 06/06/2016  PCP: [name], MD   Brief Narrative:  50 y/o with DM 2, breast cancer s/p treatment  With chemo, surgery and radiation who is being monitored by Dr [name] who has had a cough for a few weeks now and was treated as outpt with Levaquin but did not improve. Cough is severe and on the most part non-productive worse with taking deep breaths. Recent CT of the chest suggested lymphogenic spread of cancer with mets in the visualized spine.  Also has had back pain and as mentioned, has mets in her spine.   Subjective: Continues to have back pain and cough. Not much improved today. Very disturbed that her cancer has recurred.   Assessment & Plan:   Principal Problem:   Acute respiratory failure with hypoxia  - likely due to metastatic cancer although underlying infection cannot be completely occluded- has not responded to Levaquin - cont O2 - Tussionex and Tessalon for cough - have contacted oncology and pulmonary - right thoracentesis done today and sent for cytology  Active Problems:   Breast cancer of upper-outer quadrant of left female breast   - as above, oncology consulting- further work up underway  Dehydration - cont slow IVF   Back pain with metastasis - Dilaudid and Percocet  Diabetes mellitus without complication  - sliding scale insulin   Protein-calorie malnutrition, moderate  - supplements ordered  DVT prophylaxis: Lovenox Code Status: Full Family Communication:  Disposition Plan: to be determined Consultants:   Oncology  Pulmonary  Procedures:  None  Antimicrobials:  Anti-infectives    None       Objective: Vitals:   06/07/16 0431 06/07/16 1320 06/07/16 2234 06/08/16 0454  BP: (!) 164/92 (!) 157/95 (!) 155/84 116/80  Pulse: 92 99 88 94  Resp: '18 20 20 19  '$ Temp: 98.5 F (36.9 C) 98.8 F (37.1 C) 98.8 F (37.1 C) 98.6 F (37 C)  TempSrc: Oral Oral Oral  Oral  SpO2: 93% 95% 93% 91%  Weight: 72.1 kg (158 lb 15.2 oz)     Height: '5\' 10"'$  (1.778 m)       Intake/Output Summary (Last 24 hours) at 06/08/16 1233 Last data filed at 06/07/16 1301  Gross per 24 hour  Intake              360 ml  Output                0 ml  Net              360 ml   Filed Weights   06/06/16 2144 06/07/16 0431  Weight: 69.9 kg (154 lb) 72.1 kg (158 lb 15.2 oz)    Examination: General exam: Appears uncomfortable with cough and pain HEENT: PERRLA, oral mucosa moist, no sclera icterus or thrush Respiratory system: decreased breath sounds-  Respiratory effort normal- significant cough Cardiovascular system: S1 & S2 heard, RRR.  No murmurs  Gastrointestinal system: Abdomen soft, non-tender, nondistended. Normal bowel sound. No organomegaly Central nervous system: Alert and oriented. No focal neurological deficits. Extremities: No cyanosis, clubbing or edema Skin: No rashes or ulcers Psychiatry:  Mood & affect appropriate.     Data Reviewed: I have personally reviewed following labs and imaging studies  CBC:  Recent Labs Lab 06/07/16 0100 06/07/16 0105  WBC 10.6*  --   NEUTROABS 8.1*  --   HGB 11.0* 11.9*  HCT 33.2* 35.0*  MCV  86.5  --   PLT 446*  --    Basic Metabolic Panel:  Recent Labs Lab 06/07/16 0105 06/07/16 1543 06/08/16 0549  NA 138 139 137  K 3.9 4.0 3.6  CL 98* 105 101  CO2  --  29 28  GLUCOSE 146* 125* 146*  BUN '12 10 8  '$ CREATININE 0.70 0.36* 0.40*  CALCIUM  --  8.3* 8.9  MG  --   --  1.8  PHOS  --   --  3.9   GFR: Estimated Creatinine Clearance: 93 mL/min (by C-G formula based on SCr of 0.8 mg/dL). Liver Function Tests: No results for input(s): AST, ALT, ALKPHOS, BILITOT, PROT, ALBUMIN in the last 168 hours. No results for input(s): LIPASE, AMYLASE in the last 168 hours. No results for input(s): AMMONIA in the last 168 hours. Coagulation Profile:  Recent Labs Lab 06/07/16 1732  INR 1.07   Cardiac Enzymes: No  results for input(s): CKTOTAL, CKMB, CKMBINDEX, TROPONINI in the last 168 hours. BNP (last 3 results) No results for input(s): PROBNP in the last 8760 hours. HbA1C: No results for input(s): HGBA1C in the last 72 hours. CBG:  Recent Labs Lab 06/07/16 1144 06/07/16 1617 06/07/16 2230 06/08/16 0734 06/08/16 1145  GLUCAP 206* 121* 185* 136* 251*   Lipid Profile: No results for input(s): CHOL, HDL, LDLCALC, TRIG, CHOLHDL, LDLDIRECT in the last 72 hours. Thyroid Function Tests: No results for input(s): TSH, T4TOTAL, FREET4, T3FREE, THYROIDAB in the last 72 hours. Anemia Panel: No results for input(s): VITAMINB12, FOLATE, FERRITIN, TIBC, IRON, RETICCTPCT in the last 72 hours. Urine analysis:    Component Value Date/Time   COLORURINE YELLOW 06/03/2014 1050   APPEARANCEUR CLEAR 06/03/2014 1050   LABSPEC >1.030 (H) 06/03/2014 1050   PHURINE 6.0 06/03/2014 1050   GLUCOSEU NEGATIVE 06/03/2014 1050   HGBUR SMALL (A) 06/03/2014 1050   BILIRUBINUR SMALL (A) 06/03/2014 1050   KETONESUR NEGATIVE 06/03/2014 1050   PROTEINUR NEGATIVE 06/03/2014 1050   UROBILINOGEN 0.2 06/03/2014 1050   NITRITE NEGATIVE 06/03/2014 1050   LEUKOCYTESUR TRACE (A) 06/03/2014 1050   Sepsis Labs: '@LABRCNTIP'$ (procalcitonin:4,lacticidven:4) )No results found for this or any previous visit (from the past 240 hour(s)).       Radiology Studies: Dg Chest 2 View  Result Date: 06/06/2016 CLINICAL DATA:  Acute onset of shortness of breath. Initial encounter. EXAM: CHEST  2 VIEW COMPARISON:  Chest radiograph and CTA of the chest performed 05/19/2016 FINDINGS: The lungs are well-aerated. Vascular congestion is noted. Mild bibasilar opacities may reflect minimal interstitial edema. There is no evidence of pleural effusion or pneumothorax. The heart is mildly enlarged. No acute osseous abnormalities are seen. Scattered clips are seen at the left axilla. IMPRESSION: Vascular congestion and mild cardiomegaly. Mild bibasilar  opacities may reflect minimal interstitial edema. Electronically Signed   By: Garald Balding M.D.   On: 06/06/2016 23:13   Ct Angio Chest Pe W And/or Wo Contrast  Result Date: 06/07/2016 CLINICAL DATA:  Subacute onset of nonproductive cough, fever and nausea. 21 pounds of weight loss. Generalized back and chest pain. Shortness of breath with exertion. Mild leukocytosis. Initial encounter. EXAM: CT ANGIOGRAPHY CHEST WITH CONTRAST TECHNIQUE: Multidetector CT imaging of the chest was performed using the standard protocol during bolus administration of intravenous contrast. Multiplanar CT image reconstructions and MIPs were obtained to evaluate the vascular anatomy. CONTRAST:  100 mL of Isovue 300 IV contrast COMPARISON:  CTA of the chest performed 05/19/2016, and chest radiograph performed 06/06/2016 FINDINGS: There is no  evidence of pulmonary embolus. Small bilateral pleural effusions are noted. Note is made of vague soft tissue density tracking about the right hilum, measuring approximately 3.7 x 3.7 x 2.0 cm, with mild mass effect on the right-sided pulmonary arteries and right-sided bronchioles. This raises concern for malignancy. Interstitial prominence is noted, with bibasilar airspace opacities, raising concern for pulmonary edema, though underlying pneumonia cannot be excluded. Underlying fissural nodularity on the right side could reflect lymphangitic carcinomatosis, as previously noted. Peripheral scarring is noted. No pneumothorax is identified. Trace pericardial fluid is noted. This may be within normal limits, though malignant pericardial effusion cannot be excluded. No definite mediastinal lymphadenopathy is seen. Vague soft tissue density tracks about the subcarinal region and azygoesophageal recess. The great vessels are grossly unremarkable in appearance. No axillary lymphadenopathy is seen. The visualized portions of the thyroid gland are unremarkable in appearance. There is diffuse skin thickening  and irregularity along the left breast. Would correlate for any evidence of inflammatory breast cancer. Postoperative change and a small postoperative fluid collection measuring 2.8 cm are noted underlying the left nipple, with retraction of the left nipple. The visualized portions of the liver and spleen are unremarkable. Numerous lytic lesions are noted throughout the visualized thoracic and upper lumbar spine, concerning for metastatic disease. A small sclerotic focus is also noted at T5. Review of the MIP images confirms the above findings. IMPRESSION: 1. No evidence of pulmonary embolus. 2. Small bilateral pleural effusions noted. 3. Vague soft tissue density tracking about the right hilum, measuring approximately 3.7 x 3.7 x 2.0 cm, with mild mass effect on the right-sided pulmonary arteries and right-sided bronchioles. This raises concern for malignancy. Underlying right-sided pleural nodularity could reflect lymphangitic carcinomatosis, as previously noted. 4. Interstitial prominence, with bibasilar airspace opacities, concerning for pulmonary edema, though underlying pneumonia cannot be excluded. 5. Trace pericardial fluid may remain within normal limits, though malignant pericardial effusion cannot be excluded. Vague soft tissue density tracks about the subcarinal region and azygoesophageal recess, concerning for extension of malignancy. 6. Diffuse skin thickening and irregularity along the left breast. Would correlate for any evidence of inflammatory breast cancer. Underlying postoperative change and small postoperative fluid collection measuring 2.8 cm, with retraction of the left nipple. 7. Numerous lytic lesions throughout the visualized thoracic and upper lumbar spine, concerning for metastatic disease. Small sclerotic focus also noted at T5. Electronically Signed   By: Garald Balding M.D.   On: 06/07/2016 04:34   Dg Chest Port 1 View  Result Date: 06/08/2016 CLINICAL DATA:  Breast cancer. EXAM:  PORTABLE CHEST 1 VIEW COMPARISON:  CT 06/07/2016 .  Chest x-ray 06/06/2016. FINDINGS: Mediastinum stable. Stable cardiomegaly. Persistent diffuse bilateral pulmonary infiltrates and/or edema. No pleural effusion or pneumothorax. Surgical clips left chest wall. IMPRESSION: 1. Persistent bilateral pulmonary infiltrates and or edema. No significant change. 2.  Stable cardiomegaly. Electronically Signed   By: Marcello Moores  Register   On: 06/08/2016 06:40      Scheduled Meds: . benzonatate  200 mg Oral TID  . chlorpheniramine-HYDROcodone  5 mL Oral Q12H  . enoxaparin (LOVENOX) injection  40 mg Subcutaneous Q24H  . feeding supplement (ENSURE ENLIVE)  237 mL Oral BID BM  . insulin aspart  0-5 Units Subcutaneous QHS  . insulin aspart  0-9 Units Subcutaneous TID WC  . ketorolac  30 mg Intravenous Q6H  . ketorolac      . multivitamin with minerals  1 tablet Oral Daily  . sodium chloride flush  3 mL Intravenous Q12H  .  tamoxifen  20 mg Oral Daily   Continuous Infusions:    LOS: 0 days    Time spent in minutes: 60    Big[place], MD [facility] Pager: [email or web address] Password [facility] 06/08/2016, 12:33 PM

## 2016-06-08 NOTE — Procedures (Signed)
Thoracentesis Procedure Note  Pre-operative Diagnosis: right effusion  Post-operative Diagnosis: same  Indications: analysis of pleural fluid AND symptom relief   Procedure Details  Consent: Informed consent was obtained. Risks of the procedure were discussed including: infection, bleeding, pain, pneumothorax.  Under sterile conditions the patient was positioned. Betadine solution and sterile drapes were utilized.  1% buffered lidocaine was used to anesthetize the  rib space which was identified via real time [place]. Fluid was obtained without any difficulties and minimal blood loss.  A dressing was applied to the wound and wound care instructions were provided.   Findings 1000 ml of clear pleural fluid was obtained. A sample was sent to Pathology for cytogenetics, flow, and cell counts, as well as for infection analysis.  Complications:  None; patient tolerated the procedure well.          Condition: stable  Plan A follow up chest x-ray was ordered. Bed Rest for 0 hours.  Erick Colace ACNP-BC [place] Pager # [phone] OR # [phone] if no answer

## 2016-06-08 NOTE — Consult Note (Signed)
Name: Laura Matthews MRN: [id number] DOB: 1966/09/27    ADMISSION DATE:  06/06/2016 CONSULTATION DATE:  8/8  REFERRING MD :  Dr. Wynelle Cleveland  CHIEF COMPLAINT:  Cough, back pain  HISTORY OF PRESENT ILLNESS:  50 year old female with PMH as below, which is significant for Breast Cancer stage IIIC invasive ductal carcinoma, grade 3, HER-2 negative. She is followed by Dr. [name] and has undergone left lumpectomy and axillary lymph node dissection 05/2014, adjuvant chemotherapy consisting of doxorubicin and cyclophosphamide followed by paclitaxel weekly x12 completed 03/09/2015. She then underwent radiation and was started on tamoxifen 07/01/2015, which she has been taking since that time. More recently her course has been complicated by cough, chest and back pain. She was initially seen the ED for this and thought to have bronchitis. She was diagnosed with a Z-pack. She followed up with oncology 7/26 and was still symptomatic, prescribed levaquin. She again presented to ED 8/7 with complaints of SOB, chest and back pain, and cough. Also a recent 28 pound weight loss. She underwent a CT angiogram of the chest, which demonstrated concerns for metastatic disease including soft tissue density in the R hilum. PCCM asked to see for potential biopsy.    REVIEW OF SYSTEMS:   Marked right chest discomfort especially w deep breath.   SUBJECTIVE:  Still hurting f VITAL SIGNS: Temp:  [98.6 F (37 C)-98.8 F (37.1 C)] 98.6 F (37 C) (08/09 0454) Pulse Rate:  [88-99] 94 (08/09 0454) Resp:  [19-20] 19 (08/09 0454) BP: (116-157)/(80-95) 116/80 (08/09 0454) SpO2:  [91 %-95 %] 91 % (08/09 0454)  PHYSICAL EXAMINATION: General:  Awake, alert, mild pain Neuro:  Nonfocal, perrl  HEENT:  jvd wnl, no nodes neck or axillary Back: no swelling, some point tenderness mild lower thoracic Cardiovascular:  s1 s2 RRR distant Lungs: decreased on right. No accessory use. Pain w/ deep breath  Abdomen:  Soft, BS wnl, no  r/g Musculoskeletal:  No edema, no rash   Recent Labs Lab 06/07/16 0105 06/07/16 1543 06/08/16 0549  NA 138 139 137  K 3.9 4.0 3.6  CL 98* 105 101  CO2  --  29 28  BUN '12 10 8  ' CREATININE 0.70 0.36* 0.40*  GLUCOSE 146* 125* 146*    Recent Labs Lab 06/07/16 0100 06/07/16 0105  HGB 11.0* 11.9*  HCT 33.2* 35.0*  WBC 10.6*  --   PLT 446*  --    Dg Chest 2 View  Result Date: 06/06/2016 CLINICAL DATA:  Acute onset of shortness of breath. Initial encounter. EXAM: CHEST  2 VIEW COMPARISON:  Chest radiograph and CTA of the chest performed 05/19/2016 FINDINGS: The lungs are well-aerated. Vascular congestion is noted. Mild bibasilar opacities may reflect minimal interstitial edema. There is no evidence of pleural effusion or pneumothorax. The heart is mildly enlarged. No acute osseous abnormalities are seen. Scattered clips are seen at the left axilla. IMPRESSION: Vascular congestion and mild cardiomegaly. Mild bibasilar opacities may reflect minimal interstitial edema. Electronically Signed   By: Garald Balding M.D.   On: 06/06/2016 23:13   Ct Angio Chest Pe W And/or Wo Contrast  Result Date: 06/07/2016 CLINICAL DATA:  Subacute onset of nonproductive cough, fever and nausea. 21 pounds of weight loss. Generalized back and chest pain. Shortness of breath with exertion. Mild leukocytosis. Initial encounter. EXAM: CT ANGIOGRAPHY CHEST WITH CONTRAST TECHNIQUE: Multidetector CT imaging of the chest was performed using the standard protocol during bolus administration of intravenous contrast. Multiplanar CT image reconstructions  and MIPs were obtained to evaluate the vascular anatomy. CONTRAST:  100 mL of Isovue 300 IV contrast COMPARISON:  CTA of the chest performed 05/19/2016, and chest radiograph performed 06/06/2016 FINDINGS: There is no evidence of pulmonary embolus. Small bilateral pleural effusions are noted. Note is made of vague soft tissue density tracking about the right hilum, measuring  approximately 3.7 x 3.7 x 2.0 cm, with mild mass effect on the right-sided pulmonary arteries and right-sided bronchioles. This raises concern for malignancy. Interstitial prominence is noted, with bibasilar airspace opacities, raising concern for pulmonary edema, though underlying pneumonia cannot be excluded. Underlying fissural nodularity on the right side could reflect lymphangitic carcinomatosis, as previously noted. Peripheral scarring is noted. No pneumothorax is identified. Trace pericardial fluid is noted. This may be within normal limits, though malignant pericardial effusion cannot be excluded. No definite mediastinal lymphadenopathy is seen. Vague soft tissue density tracks about the subcarinal region and azygoesophageal recess. The great vessels are grossly unremarkable in appearance. No axillary lymphadenopathy is seen. The visualized portions of the thyroid gland are unremarkable in appearance. There is diffuse skin thickening and irregularity along the left breast. Would correlate for any evidence of inflammatory breast cancer. Postoperative change and a small postoperative fluid collection measuring 2.8 cm are noted underlying the left nipple, with retraction of the left nipple. The visualized portions of the liver and spleen are unremarkable. Numerous lytic lesions are noted throughout the visualized thoracic and upper lumbar spine, concerning for metastatic disease. A small sclerotic focus is also noted at T5. Review of the MIP images confirms the above findings. IMPRESSION: 1. No evidence of pulmonary embolus. 2. Small bilateral pleural effusions noted. 3. Vague soft tissue density tracking about the right hilum, measuring approximately 3.7 x 3.7 x 2.0 cm, with mild mass effect on the right-sided pulmonary arteries and right-sided bronchioles. This raises concern for malignancy. Underlying right-sided pleural nodularity could reflect lymphangitic carcinomatosis, as previously noted. 4.  Interstitial prominence, with bibasilar airspace opacities, concerning for pulmonary edema, though underlying pneumonia cannot be excluded. 5. Trace pericardial fluid may remain within normal limits, though malignant pericardial effusion cannot be excluded. Vague soft tissue density tracks about the subcarinal region and azygoesophageal recess, concerning for extension of malignancy. 6. Diffuse skin thickening and irregularity along the left breast. Would correlate for any evidence of inflammatory breast cancer. Underlying postoperative change and small postoperative fluid collection measuring 2.8 cm, with retraction of the left nipple. 7. Numerous lytic lesions throughout the visualized thoracic and upper lumbar spine, concerning for metastatic disease. Small sclerotic focus also noted at T5. Electronically Signed   By: Garald Balding M.D.   On: 06/07/2016 04:34   Dg Chest Port 1 View  Result Date: 06/08/2016 CLINICAL DATA:  Breast cancer. EXAM: PORTABLE CHEST 1 VIEW COMPARISON:  CT 06/07/2016 .  Chest x-ray 06/06/2016. FINDINGS: Mediastinum stable. Stable cardiomegaly. Persistent diffuse bilateral pulmonary infiltrates and/or edema. No pleural effusion or pneumothorax. Surgical clips left chest wall. IMPRESSION: 1. Persistent bilateral pulmonary infiltrates and or edema. No significant change. 2.  Stable cardiomegaly. Electronically Signed   By: Marcello Moores  Register   On: 06/08/2016 06:40   ASSESSMENT / PLAN:  Breast Ca suspect now stage IV R>L Pleural effusion, r/o malignant Lung mass Right hilum  Possible lymphangitic spread  Probable mets to spine.  Plan F/u echo Diagnostic and therapeutic thoracentesis on right  Will d/w Dr Ashok Cordia re: need for lung bx of hilar mass vs simply bone bx  Will add scheduled toradol  Erick Colace ACNP-BC [place] [facility] Pager # [phone] OR # [phone] if no answer   06/08/2016, 11:31 AM  PCCM Attending Note: Patient seen and examined with nurse  practitioner. Please refer to his progress note which I have reviewed in detail. Patient denies any current chest pain or pressure. Dyspnea has significant improved since thoracentesis. Denies any subjective fever or chills. Mild intermittent cough.  Review of systems: Mild nausea resolved. No abdominal pain. No lymphadenopathy in her neck, groin, or axilla.  BP (!) 125/95 (BP Location: Right Arm)   Pulse 89   Temp 98.6 F (37 C) (Oral)   Resp 20   Ht '5\' 10"'  (1.778 m)   Wt 158 lb 15.2 oz (72.1 kg)   SpO2 100%   BMI 22.81 kg/m  Gen.: Laying in bed on her left side. No distress. Appears comfortable. Integument: Warm and dry. No rash on exposed skin. Pulmonary: Improved aeration right lung base. Normal work of breathing on nasal cannula oxygen. Speaking in complete sentences.  A/P:  50 y.o. female with known stage IV breast cancer and bilateral pleural effusions right greater than left. Suspect mass right hilum is metastatic breast cancer. Also suspect right pleural effusion is malignant versus paramalignant. Significant improvement in respiratory symptoms post thoracentesis. Awaiting results from pleural fluid studies obtained today. Depending upon results may need repeat thoracentesis versus lung biopsy to confirm spread of malignancy. Briefly discussed indwelling pleural catheter placement but will hold off for now depending upon reaccumulation speed of pleural effusion.  Sonia Baller Ashok Cordia, M.D. [facility] Pager:  [phone] After 3pm or if no response, call [phone] 3:42 PM 06/08/16

## 2016-06-09 ENCOUNTER — Inpatient Hospital Stay (HOSPITAL_COMMUNITY): Payer: Medicaid Other

## 2016-06-09 DIAGNOSIS — E43 Unspecified severe protein-calorie malnutrition: Secondary | ICD-10-CM | POA: Insufficient documentation

## 2016-06-09 DIAGNOSIS — J90 Pleural effusion, not elsewhere classified: Secondary | ICD-10-CM

## 2016-06-09 DIAGNOSIS — R05 Cough: Secondary | ICD-10-CM

## 2016-06-09 DIAGNOSIS — R918 Other nonspecific abnormal finding of lung field: Secondary | ICD-10-CM

## 2016-06-09 DIAGNOSIS — C50919 Malignant neoplasm of unspecified site of unspecified female breast: Secondary | ICD-10-CM

## 2016-06-09 DIAGNOSIS — Z720 Tobacco use: Secondary | ICD-10-CM

## 2016-06-09 LAB — GLUCOSE, CAPILLARY
GLUCOSE-CAPILLARY: 173 mg/dL — AB (ref 65–99)
Glucose-Capillary: 142 mg/dL — ABNORMAL HIGH (ref 65–99)
Glucose-Capillary: 213 mg/dL — ABNORMAL HIGH (ref 65–99)
Glucose-Capillary: 216 mg/dL — ABNORMAL HIGH (ref 65–99)

## 2016-06-09 LAB — CANCER ANTIGEN 27.29: CA 27.29: 1356.9 U/mL — AB (ref 0.0–38.6)

## 2016-06-09 LAB — RHEUMATOID FACTORS, FLUID: RHEUMATOID ARTHRITIS, QN/FLUID: NEGATIVE

## 2016-06-09 LAB — PH, BODY FLUID: PH, BODY FLUID: 8

## 2016-06-09 LAB — CANCER ANTIGEN 19-9: CA 19 9: 22 U/mL (ref 0–35)

## 2016-06-09 LAB — CEA: CEA: 8.3 ng/mL — AB (ref 0.0–4.7)

## 2016-06-09 MED ORDER — LORAZEPAM 1 MG PO TABS
1.0000 mg | ORAL_TABLET | ORAL | Status: AC
  Administered 2016-06-09: 1 mg via ORAL
  Filled 2016-06-09: qty 1

## 2016-06-09 MED ORDER — ENSURE ENLIVE PO LIQD
237.0000 mL | Freq: Three times a day (TID) | ORAL | Status: DC
  Administered 2016-06-09 – 2016-06-10 (×4): 237 mL via ORAL

## 2016-06-09 MED ORDER — GADOBENATE DIMEGLUMINE 529 MG/ML IV SOLN
15.0000 mL | Freq: Once | INTRAVENOUS | Status: AC | PRN
  Administered 2016-06-09: 15 mL via INTRAVENOUS

## 2016-06-09 MED ORDER — DEXAMETHASONE 4 MG PO TABS
4.0000 mg | ORAL_TABLET | Freq: Three times a day (TID) | ORAL | Status: DC
  Administered 2016-06-09 – 2016-06-12 (×9): 4 mg via ORAL
  Filled 2016-06-09 (×11): qty 1

## 2016-06-09 MED ORDER — POLYETHYLENE GLYCOL 3350 17 G PO PACK
17.0000 g | PACK | Freq: Two times a day (BID) | ORAL | Status: DC
  Administered 2016-06-09 – 2016-06-11 (×5): 17 g via ORAL
  Filled 2016-06-09 (×6): qty 1

## 2016-06-09 MED ORDER — GLIPIZIDE ER 5 MG PO TB24
5.0000 mg | ORAL_TABLET | Freq: Every day | ORAL | Status: DC
  Administered 2016-06-09 – 2016-06-10 (×2): 5 mg via ORAL
  Filled 2016-06-09 (×2): qty 1

## 2016-06-09 MED ORDER — KETOROLAC TROMETHAMINE 30 MG/ML IJ SOLN
30.0000 mg | Freq: Four times a day (QID) | INTRAMUSCULAR | Status: AC | PRN
  Administered 2016-06-09: 30 mg via INTRAVENOUS
  Filled 2016-06-09: qty 1

## 2016-06-09 MED ORDER — DICLOFENAC SODIUM 1 % TD GEL
2.0000 g | Freq: Four times a day (QID) | TRANSDERMAL | Status: DC
  Administered 2016-06-09 – 2016-06-12 (×11): 2 g via TOPICAL
  Filled 2016-06-09: qty 100

## 2016-06-09 MED ORDER — PROMETHAZINE-CODEINE 6.25-10 MG/5ML PO SYRP: 5.0000 mL | ORAL_SOLUTION | Freq: Four times a day (QID) | ORAL | Status: DC | PRN

## 2016-06-09 MED ORDER — LIP MEDEX EX OINT
TOPICAL_OINTMENT | CUTANEOUS | Status: DC | PRN
  Filled 2016-06-09: qty 7

## 2016-06-09 MED ORDER — LETROZOLE 2.5 MG PO TABS
2.5000 mg | ORAL_TABLET | Freq: Every day | ORAL | Status: DC
  Administered 2016-06-09 – 2016-06-12 (×4): 2.5 mg via ORAL
  Filled 2016-06-09 (×5): qty 1

## 2016-06-09 MED ORDER — FENTANYL 50 MCG/HR TD PT72: 50.0000 ug | MEDICATED_PATCH | TRANSDERMAL | Status: DC

## 2016-06-09 MED ORDER — FENTANYL 75 MCG/HR TD PT72: 75.0000 ug | MEDICATED_PATCH | TRANSDERMAL | Status: DC

## 2016-06-09 NOTE — Progress Notes (Signed)
Nutrition Follow-up  DOCUMENTATION CODES:   Severe malnutrition in context of acute illness/injury  INTERVENTION:  - Continue Ensure Enlive but will increase to TID. - Continue to encourage PO intakes of meals and supplements. - RD will continue to monitor for needs.  NUTRITION DIAGNOSIS:   Increased nutrient needs related to catabolic illness, cancer and cancer related treatments as evidenced by estimated needs. -ongoing  GOAL:   Patient will meet greater than or equal to 90% of their needs -unmet  MONITOR:   PO intake, Supplement acceptance, Weight trends, Labs, I & O's  ASSESSMENT:   50 y.o. 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  8/10 Per chart review, pt consumed 10% of breakfast, 0% of lunch on 8/8 and 0% of breakfast and lunch 8/9. She states that this AM she was able to take a few bites of cereal and fruit for breakfast and that this is the first time she has had solid food since admission. Pt reports that PTA she had decreased/poor appetite that began decreasing ~1 month ago. In the past 1-2 weeks she was mainly consuming 1 meal a day (breakfast) which consisted of yogurt and a banana and she was drinking Ensure during the day. Pt denies any abdominal discomfort or nausea with breakfast and states that she is beginning to feel a bit better and that appetite is better today than it has been in several days.   Pt reports that d/t poor intakes she lost ~28 lbs in the past 1 month. Pt preferred physical assessment to be done at a different time. Pt meets criteria for malnutrition based on <50% needed PO intakes for >/= 5 days and reported weight loss. Weight up since admission with previous  IVF; will continue to monitor weight trends.  Medications reviewed; 5 mg oral Glucotrol/day, sliding scale Novolog, daily multivitamin with minerals, 17 mg Miralax BID. Labs reviewed.    8/8 - No intakes documented since admission.  - Per chart review, pt with coughing spells and SOB PTA.  - She also indicated N/V x4 days PTA with decreased appetite, poor PO intakes and recent 28 lb weight loss.  - Pt was recently dx with lung cancer, per chart review. - Pt currently having coughing spell and has requested coughing medication from RN; unable to talk with pt at this time.   - Unable to complete physical assessment at this time but will complete at follow-up on 8/10.  - Based on pt's report of 28 lb weight loss in comparison to CBW, she has lost 15% body weight in unknown time frame.  - Per chart review, this reported weight loss is consistent with weight trends since 06/16/15 at which time pt weighed 183 lbs.  - A 15% body weight loss in 1 year is not significant.  - Also per weight hx review, pt has gained 5 lbs in the past 13 days; will continue to monitor weight trends.  IVF: NS @ 125 mL/hr.    Diet Order:  Diet Carb Modified Fluid consistency: Thin; Room service appropriate? Yes  Skin:  Reviewed, no issues  Last BM:  8/9  Height:   Ht Readings from Last 1 Encounters:  06/07/16 '5\' 10"'$  (1.778 m)    Weight:   Wt Readings  from Last 1 Encounters:  06/07/16 158 lb 15.2 oz (72.1 kg)    Ideal Body Weight:  68.18 kg  BMI:  Body mass index is 22.81 kg/m.  Estimated Nutritional Needs:   Kcal:  2160-2380 (30-33 kcal/kg)  Protein:  90-105 grams  Fluid:  >/= 2 L/day  EDUCATION NEEDS:   No education needs identified at this time    Jarome Matin, MS, RD, LDN Inpatient Clinical Dietitian Pager # [phone] After hours/weekend pager # [phone]

## 2016-06-09 NOTE — Progress Notes (Signed)
SATURATION QUALIFICATIONS: (This note is used to comply with regulatory documentation for home oxygen) ° °Patient Saturations on Room Air at Rest = 97% ° °Patient Saturations on Room Air while Ambulating = 94% ° °Patient Saturations on *** Liters of oxygen while Ambulating = ***% ° °Please briefly explain why patient needs home oxygen: °

## 2016-06-09 NOTE — Progress Notes (Signed)
PROGRESS NOTE    Laura Matthews  VQQ:[id number] DOB: Mar 27, 1966 DOA: 06/06/2016  PCP: [name], MD   Brief Narrative:  50 y/o with DM 2, breast cancer s/p treatment  With chemo, surgery and radiation who is being monitored by Dr [name] who has had a cough for a few weeks now and was treated as outpt with Levaquin but did not improve. Cough is severe and on the most part non-productive worse with taking deep breaths. Recent CT of the chest suggested lymphogenic spread of cancer with mets in the visualized spine.  Also has had back pain and as mentioned, has mets in her spine.   Subjective: Back pain and cough are better. Has new pain in right lower rib cage  Assessment & Plan:   Principal Problem:   Acute respiratory failure with hypoxia  - likely due to metastatic cancer although underlying infection cannot be completely occluded- has not responded to Levaquin - cont O2 - Tussionex and Tessalon for cough - have contacted oncology and pulmonary - right thoracentesis done and sent for cytology - have ordered home O2  Active Problems:   Breast cancer of upper-outer quadrant of left female breast   - as above, oncology consulting- further work up underway  Dehydration - cont slow IVF   Back pain with metastasis - Dilaudid and Percocet  >>  add Fentanyl patch 75 mcg to reduce use of PRN pain meds - add steroids - Add voltaren gel for chest wall pain - to be assessed by rad onc  Anxiety  about diagnosis- Ativan  Diabetes mellitus without complication  - sliding scale insulin- resume Glipizide as steroids being started   Protein-calorie malnutrition, moderate  - supplements ordered  DVT prophylaxis: Lovenox Code Status: Full Family Communication:  Disposition Plan: can be discharged once pain is controlled Consultants:   Oncology  Pulmonary  Procedures:  None  Antimicrobials:  Anti-infectives    None       Objective: Vitals:   06/08/16 1512  06/08/16 2153 06/09/16 0457 06/09/16 1332  BP: (!) 125/95 136/77 (!) 150/91 (!) 142/89  Pulse: 89 87 79 72  Resp: '20 20 20 18  '$ Temp: 98.6 F (37 C) 98.5 F (36.9 C) 98.4 F (36.9 C) 98.1 F (36.7 C)  TempSrc: Oral Oral Oral Oral  SpO2: 100% 94% 94% 97%  Weight:      Height:        Intake/Output Summary (Last 24 hours) at 06/09/16 1542 Last data filed at 06/09/16 1230  Gross per 24 hour  Intake              480 ml  Output                0 ml  Net              480 ml   Filed Weights   06/06/16 2144 06/07/16 0431  Weight: 69.9 kg (154 lb) 72.1 kg (158 lb 15.2 oz)    Examination: General exam: Appears comfortable HEENT: PERRLA, oral mucosa moist, no sclera icterus or thrush Respiratory system: decreased breath sounds-  Respiratory effort normal  Cardiovascular system: S1 & S2 heard, RRR.  No murmurs  Gastrointestinal system: Abdomen soft, non-tender, nondistended. Normal bowel sound. No organomegaly Central nervous system: Alert and oriented. No focal neurological deficits. Extremities: No cyanosis, clubbing or edema Skin: No rashes or ulcers Psychiatry:  Mood & affect appropriate.     Data Reviewed: I have personally reviewed following labs  and imaging studies  CBC:  Recent Labs Lab 06/07/16 0100 06/07/16 0105  WBC 10.6*  --   NEUTROABS 8.1*  --   HGB 11.0* 11.9*  HCT 33.2* 35.0*  MCV 86.5  --   PLT 446*  --    Basic Metabolic Panel:  Recent Labs Lab 06/07/16 0105 06/07/16 1543 06/08/16 0549  NA 138 139 137  K 3.9 4.0 3.6  CL 98* 105 101  CO2  --  29 28  GLUCOSE 146* 125* 146*  BUN '12 10 8  '$ CREATININE 0.70 0.36* 0.40*  CALCIUM  --  8.3* 8.9  MG  --   --  1.8  PHOS  --   --  3.9   GFR: Estimated Creatinine Clearance: 93 mL/min (by C-G formula based on SCr of 0.8 mg/dL). Liver Function Tests:  Recent Labs Lab 06/08/16 1324  PROT 6.7   No results for input(s): LIPASE, AMYLASE in the last 168 hours. No results for input(s): AMMONIA in the  last 168 hours. Coagulation Profile:  Recent Labs Lab 06/07/16 1732  INR 1.07   Cardiac Enzymes: No results for input(s): CKTOTAL, CKMB, CKMBINDEX, TROPONINI in the last 168 hours. BNP (last 3 results) No results for input(s): PROBNP in the last 8760 hours. HbA1C: No results for input(s): HGBA1C in the last 72 hours. CBG:  Recent Labs Lab 06/08/16 1145 06/08/16 1626 06/08/16 2150 06/09/16 0738 06/09/16 1147  GLUCAP 251* 125* 203* 142* 213*   Lipid Profile:  Recent Labs  06/08/16 1324  CHOL 177   Thyroid Function Tests: No results for input(s): TSH, T4TOTAL, FREET4, T3FREE, THYROIDAB in the last 72 hours. Anemia Panel: No results for input(s): VITAMINB12, FOLATE, FERRITIN, TIBC, IRON, RETICCTPCT in the last 72 hours. Urine analysis:    Component Value Date/Time   COLORURINE YELLOW 06/03/2014 1050   APPEARANCEUR CLEAR 06/03/2014 1050   LABSPEC >1.030 (H) 06/03/2014 1050   PHURINE 6.0 06/03/2014 1050   GLUCOSEU NEGATIVE 06/03/2014 1050   HGBUR SMALL (A) 06/03/2014 1050   BILIRUBINUR SMALL (A) 06/03/2014 1050   KETONESUR NEGATIVE 06/03/2014 1050   PROTEINUR NEGATIVE 06/03/2014 1050   UROBILINOGEN 0.2 06/03/2014 1050   NITRITE NEGATIVE 06/03/2014 1050   LEUKOCYTESUR TRACE (A) 06/03/2014 1050   Sepsis Labs: '@LABRCNTIP'$ (procalcitonin:4,lacticidven:4) ) Recent Results (from the past 240 hour(s))  Body fluid culture     Status: None (Preliminary result)   Collection Time: 06/08/16  1:00 PM  Result Value Ref Range Status   Specimen Description PLEURAL FLUID  Final   Special Requests NONE  Final   Gram Stain   Final    FEW WBC PRESENT,BOTH PMN AND MONONUCLEAR NO ORGANISMS SEEN    Culture   Final    NO GROWTH < 24 HOURS Performed at [facility]    Report Status PENDING  Incomplete         Radiology Studies: Dg Chest Port 1 View  Result Date: 06/09/2016 CLINICAL DATA:  Follow-up pleural effusion.  Breast cancer. EXAM: PORTABLE CHEST 1 VIEW  COMPARISON:  Chest radiograph from one day prior. FINDINGS: Surgical clips throughout the left axilla and left lateral breast are again noted. Stable cardiomediastinal silhouette with normal heart size. No pneumothorax. Stable trace bilateral pleural effusions. Hazy and linear opacities throughout both lungs are not appreciably changed. IMPRESSION: 1. No pneumothorax. 2. Stable trace bilateral pleural effusions. 3. Stable hazy and linear opacities throughout both lungs. Given the normal heart size and history of breast cancer, this finding raises concern for lymphangitic  tumor as noted on recent chest CT studies. Electronically Signed   By: Ilona Sorrel M.D.   On: 06/09/2016 12:31   Dg Chest Port 1 View  Result Date: 06/08/2016 CLINICAL DATA:  RIGHT pleural effusion post thoracentesis EXAM: PORTABLE CHEST 1 VIEW COMPARISON:  Portable exam 1310 hours compared earlier study of 01/1939 2 hours FINDINGS: Minimal enlargement of cardiac silhouette. Slight pulmonary vascular congestion with persistent pulmonary edema unchanged. Improved aeration RIGHT base. No pneumothorax following thoracentesis. Surgical clips RIGHT axilla. No acute osseous findings. IMPRESSION: No pneumothorax following RIGHT thoracentesis. Persistent pulmonary edema. Electronically Signed   By: Lavonia Dana M.D.   On: 06/08/2016 13:33   Dg Chest Port 1 View  Result Date: 06/08/2016 CLINICAL DATA:  Breast cancer. EXAM: PORTABLE CHEST 1 VIEW COMPARISON:  CT 06/07/2016 .  Chest x-ray 06/06/2016. FINDINGS: Mediastinum stable. Stable cardiomegaly. Persistent diffuse bilateral pulmonary infiltrates and/or edema. No pleural effusion or pneumothorax. Surgical clips left chest wall. IMPRESSION: 1. Persistent bilateral pulmonary infiltrates and or edema. No significant change. 2.  Stable cardiomegaly. Electronically Signed   By: Marcello Moores  Register   On: 06/08/2016 06:40      Scheduled Meds: . benzonatate  200 mg Oral TID  . chlorpheniramine-HYDROcodone   5 mL Oral Q12H  . dexamethasone  4 mg Oral Q8H  . diclofenac sodium  2 g Topical QID  . enoxaparin (LOVENOX) injection  40 mg Subcutaneous Q24H  . feeding supplement (ENSURE ENLIVE)  237 mL Oral TID BM  . [START ON 06/12/2016] fentaNYL  75 mcg Transdermal Q72H  . glipiZIDE  5 mg Oral Q breakfast  . insulin aspart  0-5 Units Subcutaneous QHS  . insulin aspart  0-9 Units Subcutaneous TID WC  . ketorolac  30 mg Intravenous Q6H  . letrozole  2.5 mg Oral Daily  . LORazepam  1 mg Oral On Call  . multivitamin with minerals  1 tablet Oral Daily  . polyethylene glycol  17 g Oral BID  . sodium chloride flush  3 mL Intravenous Q12H   Continuous Infusions:    LOS: 1 day    Time spent in minutes: 3    [place], MD [facility] Pager: [email or web address] Password [facility] 06/09/2016, 3:42 PM

## 2016-06-09 NOTE — Consult Note (Addendum)
Radiation Oncology         [phone] ________________________________  Name: Laura Matthews MRN: [id number]  Date: 06/08/16  DOB: 03-Oct-1966  TI:[id number], Gwynneth Munson, MD  No ref. provider found     REFERRING PHYSICIAN: No ref. provider found   DIAGNOSIS: The primary encounter diagnosis was Hypoxia. Diagnoses of Shortness of breath, Cough, Malignant neoplasm of lung, unspecified laterality, unspecified part of lung ([place]), Breast cancer ([place]), and S/P thoracentesis were also pertinent to this visit.   HISTORY OF PRESENT ILLNESS: Laura Matthews is a 50 y.o. female seen at the request of Dr. [name] for consideration of palliative radiotherapy to the thoracic and lumbar spine for pain relief due to metastatic breast cancer. Her treatment history has been outlined but she completed radiation for Stage IIIC, T1c, N3, cM0 ER/PR positive invasive ductacl carcinoma of the left breast. She completed chemotherapy and radiation and has been on tamoxifen since. She presented due to progressive back pain and has multiple vertebral bodies involved with sclerotic lesions of the thoracic and lumbar spine. During her work up she has also been noted to have a large right pleural effusion and hilar mass. She had a thoracentesis performed today and cytology is pending. If this is inconclusive, Dr. Ashok Cordia plans for bronchoscopy.    PREVIOUS RADIATION THERAPY: Yes  04/22/15-06/23/15: Left breast/ 45 Gy at 1.8 Gy per fraction x 25 fractions.  Left supraclavicular fossa and axilla/ 45 Gy at 1.8 Gy per fraction x 25 fractions Left breast boost/ 16 Gy at 2 Gy per fraction x 8 fractions  PAST MEDICAL HISTORY:  Past Medical History:  Diagnosis Date  . Anemia    low iron  . Arthritis   . Depression    pt denies   . Family history of malignant neoplasm of breast   . Head ache   . Hot flashes   . Lymphedema of breast    left  . Malignant neoplasm of breast (female), unspecified site    left breast  . Mental  disorder    depression-  . Newly diagnosed diabetes 11/12/2014   chemo increasing blood glucose; not on meds now  . Radiation 04/22/15-06/23/15   Left Breast       PAST SURGICAL HISTORY: Past Surgical History:  Procedure Laterality Date  . BREAST LUMPECTOMY WITH SENTINEL LYMPH NODE BIOPSY Left 06/11/2014   Procedure: BREAST LUMPECTOMY WITH SENTINEL LYMPH NODE BX, POSSIBLE AXILLARY LYMPH NODE DISSECTION;  Surgeon: Stark Klein, MD;  Location: [facility];  Service: General;  Laterality: Left;  . EVACUATION BREAST HEMATOMA Left 07/10/2014   Procedure: EVACUATION OF LEFT BREAST HEMATOMA ;  Surgeon: Stark Klein, MD;  Location: [facility];  Service: General;  Laterality: Left;  . FOOT SURGERY  2011   rt  . INCISION AND DRAINAGE Left 07/02/2014   Procedure: Drainage left breast seroma;  Surgeon: Stark Klein, MD;  Location: [facility];  Service: General;  Laterality: Left;  . INCISION AND DRAINAGE ABSCESS N/A 11/12/2014   Procedure: ASPIRATION AND INCISION AND DRAINAGE LEFT BREAST ABSCESS;  Surgeon: Michael Boston, MD;  Location: [facility];  Service: General;  Laterality: N/A;  . [place]   right  . PORT-A-CATH REMOVAL Left 07/22/2015   Procedure: REMOVAL PORT-A-CATH;  Surgeon: Stark Klein, MD;  Location: [facility];  Service: General;  Laterality: Left;  . PORTACATH PLACEMENT N/A 07/02/2014   Procedure: INSERTION PORT-A-CATH;  Surgeon: Stark Klein, MD;  Location: [facility];  Service: General;  Laterality: N/A;  . TUBAL LIGATION    . UTERINE FIBROID SURGERY  2005   ablation     FAMILY HISTORY:  Family History  Problem Relation Age of Onset  . Breast cancer Mother 68    currently 33; TAH/BSO d/t ?cervical ca at 2  . Lung cancer Father     deceased 64  . Breast cancer Maternal Aunt     dx 53s; currently in her late 41s  . Diabetes Neg Hx      SOCIAL HISTORY:  reports that she has quit smoking. Her smoking use included  Cigarettes. She has a 5.00 pack-year smoking history. She has never used smokeless tobacco. She reports that she uses drugs, including Oxycodone and Other-see comments. She reports that she does not drink alcohol.   ALLERGIES: Hydrocodone   MEDICATIONS:  Current Facility-Administered Medications  Medication Dose Route Frequency Provider Last Rate Last Dose  . acetaminophen (TYLENOL) tablet 650 mg  650 mg Oral Q6H PRN Ivor Costa, MD       Or  . acetaminophen (TYLENOL) suppository 650 mg  650 mg Rectal Q6H PRN Ivor Costa, MD      . albuterol (PROVENTIL) (2.5 MG/3ML) 0.083% nebulizer solution 5 mg  5 mg Nebulization Q4H PRN Ivor Costa, MD      . benzonatate (TESSALON) capsule 200 mg  200 mg Oral TID Debbe Odea, MD   200 mg at 06/08/16 2007  . chlorpheniramine-HYDROcodone (TUSSIONEX) 10-8 MG/5ML suspension 5 mL  5 mL Oral Q12H Debbe Odea, MD   5 mL at 06/08/16 2007  . enoxaparin (LOVENOX) injection 40 mg  40 mg Subcutaneous Q24H Ivor Costa, MD   40 mg at 06/08/16 0935  . feeding supplement (ENSURE ENLIVE) (ENSURE ENLIVE) liquid 237 mL  237 mL Oral BID BM Debbe Odea, MD   237 mL at 06/08/16 1000  . HYDROmorphone (DILAUDID) injection 1-2 mg  1-2 mg Intravenous Q3H PRN Debbe Odea, MD   2 mg at 06/09/16 0504  . insulin aspart (novoLOG) injection 0-5 Units  0-5 Units Subcutaneous QHS Ivor Costa, MD   2 Units at 06/08/16 2214  . insulin aspart (novoLOG) injection 0-9 Units  0-9 Units Subcutaneous TID WC Ivor Costa, MD   1 Units at 06/08/16 1723  . ipratropium (ATROVENT) nebulizer solution 0.5 mg  0.5 mg Nebulization Q6H PRN Debbe Odea, MD      . ketorolac (TORADOL) 30 MG/ML injection 30 mg  30 mg Intravenous Q6H Erick Colace, NP   30 mg at 06/09/16 0505  . LORazepam (ATIVAN) injection 1 mg  1 mg Intravenous Q6H PRN Debbe Odea, MD   1 mg at 06/08/16 2007  . multivitamin with minerals tablet 1 tablet  1 tablet Oral Daily Debbe Odea, MD   1 tablet at 06/08/16 0936  . oxyCODONE (Oxy  IR/ROXICODONE) immediate release tablet 10 mg  10 mg Oral Q4H PRN Debbe Odea, MD   10 mg at 06/08/16 2245   And  . oxyCODONE-acetaminophen (PERCOCET/ROXICET) 5-325 MG per tablet 2 tablet  2 tablet Oral Q6H PRN Debbe Odea, MD   2 tablet at 06/08/16 2245  . sodium chloride flush (NS) 0.9 % injection 3 mL  3 mL Intravenous Q12H Ivor Costa, MD   3 mL at 06/08/16 2007  . tamoxifen (NOLVADEX) tablet 20 mg  20 mg Oral Daily Ivor Costa, MD   20 mg at 06/08/16 0936  . zolpidem (AMBIEN) tablet 5 mg  5 mg Oral QHS PRN  Gardiner Barefoot, NP   5 mg at 06/08/16 2245     REVIEW OF SYSTEMS: On review of systems, the patient reports that she is not doing well overall. She is frustrated that she hasn't been cancer free for a year, and reports that she is in quite a lot of pain in her mid-low back. She reports that her pain is contstant. She denies any paresthesias , or episodes of loss of bowel or bladder control.  She denies any chest pain, shortness of breath, cough, fevers, chills, night sweats, unintended weight changes. She denies any abdominal pain, nausea or vomiting. She denies any new musculoskeletal or joint aches or pains. A complete review of systems is obtained and is otherwise negative.     PHYSICAL EXAM:  height is '5\' 10"'$  (1.778 m) and weight is 158 lb 15.2 oz (72.1 kg). Her oral temperature is 98.4 F (36.9 C). Her blood pressure is 150/91 (abnormal) and her pulse is 79. Her respiration is 20 and oxygen saturation is 94%.   Pain scale 7/10 In general this is a well appearing African American femalein no acute distress. She is alert and oriented x4 and appropriate throughout the examination. HEENT reveals that the patient is normocephalic, atraumatic. EOMs are intact. PERRLA. Skin is intact without any evidence of gross lesions. Cardiovascular exam reveals a regular rate and rhythm, no clicks rubs or murmurs are auscultated. Chest is clear to auscultation bilaterally. Lymphatic assessment is  performed and does not reveal any adenopathy in the cervical, supraclavicular, axillary, or inguinal chains. Abdomen has active bowel sounds in all quadrants and is intact. The abdomen is soft, non tender, non distended. Lower extremities are negative for pretibial pitting edema, deep calf tenderness, cyanosis or clubbing. The patient is able to point out that her pain begins around the mid back around T8 and extends diffusely down to her "tailbone." Pain is not reproducible with light touch.   ECOG =2  0 - Asymptomatic (Fully active, able to carry on all predisease activities without restriction)  1 - Symptomatic but completely ambulatory (Restricted in physically strenuous activity but ambulatory and able to carry out work of a light or sedentary nature. For example, light housework, office work)  2 - Symptomatic, <50% in bed during the day (Ambulatory and capable of all self care but unable to carry out any work activities. Up and about more than 50% of waking hours)  3 - Symptomatic, >50% in bed, but not bedbound (Capable of only limited self-care, confined to bed or chair 50% or more of waking hours)  4 - Bedbound (Completely disabled. Cannot carry on any self-care. Totally confined to bed or chair)  5 - Death   Eustace Pen MM, Creech RH, Tormey DC, et al. [phone]). "Toxicity and response criteria of the [facility]". [place] Oncol. 5 (6): 649-55    LABORATORY DATA:  Lab Results  Component Value Date   WBC 10.6 (H) 06/07/2016   HGB 11.9 (L) 06/07/2016   HCT 35.0 (L) 06/07/2016   MCV 86.5 06/07/2016   PLT 446 (H) 06/07/2016   Lab Results  Component Value Date   NA 137 06/08/2016   K 3.6 06/08/2016   CL 101 06/08/2016   CO2 28 06/08/2016   Lab Results  Component Value Date   ALT 25 03/02/2015   AST 18 03/02/2015   ALKPHOS 68 03/02/2015   BILITOT 0.26 03/02/2015      RADIOGRAPHY: Dg Chest 2 View  Result Date:  06/06/2016 CLINICAL DATA:  Acute onset  of shortness of breath. Initial encounter. EXAM: CHEST  2 VIEW COMPARISON:  Chest radiograph and CTA of the chest performed 05/19/2016 FINDINGS: The lungs are well-aerated. Vascular congestion is noted. Mild bibasilar opacities may reflect minimal interstitial edema. There is no evidence of pleural effusion or pneumothorax. The heart is mildly enlarged. No acute osseous abnormalities are seen. Scattered clips are seen at the left axilla. IMPRESSION: Vascular congestion and mild cardiomegaly. Mild bibasilar opacities may reflect minimal interstitial edema. Electronically Signed   By: Garald Balding M.D.   On: 06/06/2016 23:13   Dg Ribs Unilateral W/chest Right  Result Date: 05/19/2016 CLINICAL DATA:  Cough 3 weeks with anterior right rib pain extending to back. History of left breast cancer. EXAM: RIGHT RIBS AND CHEST - 3+ VIEW COMPARISON:  09/17/2014 FINDINGS: Lungs are adequately inflated with mild prominence of the perihilar markings. No focal lobar consolidation or effusion. No pneumothorax. Cardiomediastinal silhouette is within normal. Multiple surgical clips over the left lateral chest and axilla. No definite right rib fracture. IMPRESSION: Suggestion of mild vascular congestion. No right rib fracture. Electronically Signed   By: Marin Olp M.D.   On: 05/19/2016 15:59   Ct Angio Chest Pe W And/or Wo Contrast  Result Date: 06/07/2016 CLINICAL DATA:  Subacute onset of nonproductive cough, fever and nausea. 21 pounds of weight loss. Generalized back and chest pain. Shortness of breath with exertion. Mild leukocytosis. Initial encounter. EXAM: CT ANGIOGRAPHY CHEST WITH CONTRAST TECHNIQUE: Multidetector CT imaging of the chest was performed using the standard protocol during bolus administration of intravenous contrast. Multiplanar CT image reconstructions and MIPs were obtained to evaluate the vascular anatomy. CONTRAST:  100 mL of Isovue 300 IV contrast COMPARISON:  CTA of the chest performed  05/19/2016, and chest radiograph performed 06/06/2016 FINDINGS: There is no evidence of pulmonary embolus. Small bilateral pleural effusions are noted. Note is made of vague soft tissue density tracking about the right hilum, measuring approximately 3.7 x 3.7 x 2.0 cm, with mild mass effect on the right-sided pulmonary arteries and right-sided bronchioles. This raises concern for malignancy. Interstitial prominence is noted, with bibasilar airspace opacities, raising concern for pulmonary edema, though underlying pneumonia cannot be excluded. Underlying fissural nodularity on the right side could reflect lymphangitic carcinomatosis, as previously noted. Peripheral scarring is noted. No pneumothorax is identified. Trace pericardial fluid is noted. This may be within normal limits, though malignant pericardial effusion cannot be excluded. No definite mediastinal lymphadenopathy is seen. Vague soft tissue density tracks about the subcarinal region and azygoesophageal recess. The great vessels are grossly unremarkable in appearance. No axillary lymphadenopathy is seen. The visualized portions of the thyroid gland are unremarkable in appearance. There is diffuse skin thickening and irregularity along the left breast. Would correlate for any evidence of inflammatory breast cancer. Postoperative change and a small postoperative fluid collection measuring 2.8 cm are noted underlying the left nipple, with retraction of the left nipple. The visualized portions of the liver and spleen are unremarkable. Numerous lytic lesions are noted throughout the visualized thoracic and upper lumbar spine, concerning for metastatic disease. A small sclerotic focus is also noted at T5. Review of the MIP images confirms the above findings. IMPRESSION: 1. No evidence of pulmonary embolus. 2. Small bilateral pleural effusions noted. 3. Vague soft tissue density tracking about the right hilum, measuring approximately 3.7 x 3.7 x 2.0 cm, with  mild mass effect on the right-sided pulmonary arteries and right-sided bronchioles. This raises concern  for malignancy. Underlying right-sided pleural nodularity could reflect lymphangitic carcinomatosis, as previously noted. 4. Interstitial prominence, with bibasilar airspace opacities, concerning for pulmonary edema, though underlying pneumonia cannot be excluded. 5. Trace pericardial fluid may remain within normal limits, though malignant pericardial effusion cannot be excluded. Vague soft tissue density tracks about the subcarinal region and azygoesophageal recess, concerning for extension of malignancy. 6. Diffuse skin thickening and irregularity along the left breast. Would correlate for any evidence of inflammatory breast cancer. Underlying postoperative change and small postoperative fluid collection measuring 2.8 cm, with retraction of the left nipple. 7. Numerous lytic lesions throughout the visualized thoracic and upper lumbar spine, concerning for metastatic disease. Small sclerotic focus also noted at T5. Electronically Signed   By: Garald Balding M.D.   On: 06/07/2016 04:34   Ct Angio Chest Pe W/cm &/or Wo Cm  Result Date: 05/20/2016 CLINICAL DATA:  Right chest pain and elevated D-dimer. EXAM: CT ANGIOGRAPHY CHEST WITH CONTRAST TECHNIQUE: Multidetector CT imaging of the chest was performed using the standard protocol during bolus administration of intravenous contrast. Multiplanar CT image reconstructions and MIPs were obtained to evaluate the vascular anatomy. CONTRAST:  80 cc Isovue 370 intravenous COMPARISON:  05/29/2014 head CT FINDINGS: Cardiovascular: Limited pulmonary artery CTA, especially beyond the segmental level. This appears secondary to transient interruption of contrast. No evidence of pulmonary embolism. Negative thoracic aorta. Normal heart size. No pericardial effusion. Mediastinum: Increased soft tissue in the right hilum without discrete measurable nodule. Lungs/Pleura: Diffuse  Kerley lines consistent with mild edema. Small pleural effusion on the right without convincing nodularity. Subpleural reticulation left upper lobe is likely radiation fibrosis. There is asymmetric airway thickening on the right with thickened major and minor fissures around the right middle lobe that has nodular morphology and neighboring airspace opacity. Air trapping bilaterally. Upper abdomen: Hepatic steatosis. Mildly lobulated adrenal glands without discrete mass. Musculoskeletal: Lumpectomy site in the left breast with central fluid like density. Area measures 23 mm. Left breast is thick walled, potentially radiation related. Soft tissue nodularity, nonspecific, around left axillary clips. Patient is followed with mammography, most recently 01/06/2016. Review of the MIP images confirms the above findings. IMPRESSION: 1. Limited CTA of the pulmonary arteries. Negative for pulmonary embolism to the segmental level. 2. Mild pulmonary edema with small right pleural effusion. 3. Right-sided fissural nodularity and asymmetric hilar soft tissue is primarily concerning for lymphangitic carcinomatosis. Atypical infection is also considered in the acute setting. 4. Left axillary dissection with unexpected soft tissue around the surgical clips. 5. Suggest PET-CT follow-up after convalescence to evaluate #3 and #4. Electronically Signed   By: Monte Fantasia M.D.   On: 05/20/2016 00:03   Dg Chest Port 1 View  Result Date: 06/08/2016 CLINICAL DATA:  RIGHT pleural effusion post thoracentesis EXAM: PORTABLE CHEST 1 VIEW COMPARISON:  Portable exam 1310 hours compared earlier study of 01/1939 2 hours FINDINGS: Minimal enlargement of cardiac silhouette. Slight pulmonary vascular congestion with persistent pulmonary edema unchanged. Improved aeration RIGHT base. No pneumothorax following thoracentesis. Surgical clips RIGHT axilla. No acute osseous findings. IMPRESSION: No pneumothorax following RIGHT thoracentesis.  Persistent pulmonary edema. Electronically Signed   By: Lavonia Dana M.D.   On: 06/08/2016 13:33   Dg Chest Port 1 View  Result Date: 06/08/2016 CLINICAL DATA:  Breast cancer. EXAM: PORTABLE CHEST 1 VIEW COMPARISON:  CT 06/07/2016 .  Chest x-ray 06/06/2016. FINDINGS: Mediastinum stable. Stable cardiomegaly. Persistent diffuse bilateral pulmonary infiltrates and/or edema. No pleural effusion or pneumothorax. Surgical clips left chest  wall. IMPRESSION: 1. Persistent bilateral pulmonary infiltrates and or edema. No significant change. 2.  Stable cardiomegaly. Electronically Signed   By: Marcello Moores  Register   On: 06/08/2016 06:40       IMPRESSION: Recurrent stage IIIC ER positive invasice ductal carcinoma of the left breast with bony metastases to the thoracic and lumbar spine   PLAN: Dr. Lisbeth Renshaw has reviewed the patient's films and has offered 30 Gy in 10 fractions to the patient's Thoracic and Lumbar spine. We will proceed with CT simulation tomorrow and begin treatment most likely on Monday 06/13/16. The risks, benefits, short, and long term effects are detailed and she is interested in moving forward.     Carola Rhine, PAC

## 2016-06-09 NOTE — Progress Notes (Signed)
Laura Matthews   DOB:1965/12/14   KZ#:[id number]   TDD#:[id number]  Subjective: more calm; still coughing "a lot"; tolerated thoracentesis yesterday w/o compliications; pain not controlled; nephew in room   Objective: middle aged African American woman examined in bed Vitals:   06/08/16 2153 06/09/16 0457  BP: 136/77 (!) 150/91  Pulse: 87 79  Resp: 20 20  Temp: 98.5 F (36.9 C) 98.4 F (36.9 C)    Body mass index is 22.81 kg/m.  Intake/Output Summary (Last 24 hours) at 06/09/16 0811 Last data filed at 06/08/16 1513  Gross per 24 hour  Intake              240 ml  Output                0 ml  Net              240 ml     Sclerae unicteric  Lungs no rales or wheezes--auscultated anterolaterally  Heart regular rate and rhythm  Abdomen soft, +BS  Exquisite tenderness on Right rib cage laterally, below breast  Neuro nonfocal  Breast exam:   CBG (last 3)   Recent Labs  06/08/16 1626 06/08/16 2150 06/09/16 0738  GLUCAP 125* 203* 142*     Labs:  Lab Results  Component Value Date   WBC 10.6 (H) 06/07/2016   HGB 11.9 (L) 06/07/2016   HCT 35.0 (L) 06/07/2016   MCV 86.5 06/07/2016   PLT 446 (H) 06/07/2016   NEUTROABS 8.1 (H) 06/07/2016    _0 @  Urine Studies No results for input(s): UHGB, CRYS in the last 72 hours.  Invalid input(s): UACOL, UAPR, USPG, UPH, UTP, UGL, UKET, UBIL, UNIT, UROB, ULEU, UEPI, UWBC, URBC, UBAC, CAST, [place], [place]  Basic Metabolic Panel:  Recent Labs Lab 06/07/16 0105 06/07/16 1543 06/08/16 0549  NA 138 139 137  K 3.9 4.0 3.6  CL 98* 105 101  CO2  --  29 28  GLUCOSE 146* 125* 146*  BUN _1 CREATININE 0.70 0.36* 0.40*  CALCIUM  --  8.3* 8.9  MG  --   --  1.8  PHOS  --   --  3.9   GFR Estimated Creatinine Clearance: 93 mL/min (by C-G formula based on SCr of 0.8 mg/dL). Liver Function Tests:  Recent Labs Lab 06/08/16 1324  PROT 6.7   No results for input(s): LIPASE, AMYLASE in the last 168 hours. No results  for input(s): AMMONIA in the last 168 hours. Coagulation profile  Recent Labs Lab 06/07/16 1732  INR 1.07    CBC:  Recent Labs Lab 06/07/16 0100 06/07/16 0105  WBC 10.6*  --   NEUTROABS 8.1*  --   HGB 11.0* 11.9*  HCT 33.2* 35.0*  MCV 86.5  --   PLT 446*  --    Cardiac Enzymes: No results for input(s): CKTOTAL, CKMB, CKMBINDEX, TROPONINI in the last 168 hours. BNP: Invalid input(s): POCBNP CBG:  Recent Labs Lab 06/08/16 0734 06/08/16 1145 06/08/16 1626 06/08/16 2150 06/09/16 0738  GLUCAP 136* 251* 125* 203* 142*   D-Dimer No results for input(s): DDIMER in the last 72 hours. Hgb A1c No results for input(s): HGBA1C in the last 72 hours. Lipid Profile  Recent Labs  06/08/16 1324  CHOL 177   Thyroid function studies No results for input(s): TSH, T4TOTAL, T3FREE, THYROIDAB in the last 72 hours.  Invalid input(s): FREET3 Anemia work up No results for input(s): VITAMINB12, FOLATE, FERRITIN, TIBC, IRON, RETICCTPCT in the  last 72 hours. Microbiology Recent Results (from the past 240 hour(s))  Body fluid culture     Status: None (Preliminary result)   Collection Time: 06/08/16  1:00 PM  Result Value Ref Range Status   Specimen Description PLEURAL FLUID  Final   Special Requests NONE  Final   Gram Stain   Final    FEW WBC PRESENT,BOTH PMN AND MONONUCLEAR NO ORGANISMS SEEN    Culture PENDING  Incomplete   Report Status PENDING  Incomplete      Studies:  Dg Chest Port 1 View  Result Date: 06/08/2016 CLINICAL DATA:  RIGHT pleural effusion post thoracentesis EXAM: PORTABLE CHEST 1 VIEW COMPARISON:  Portable exam 1310 hours compared earlier study of 01/1939 2 hours FINDINGS: Minimal enlargement of cardiac silhouette. Slight pulmonary vascular congestion with persistent pulmonary edema unchanged. Improved aeration RIGHT base. No pneumothorax following thoracentesis. Surgical clips RIGHT axilla. No acute osseous findings. IMPRESSION: No pneumothorax following  RIGHT thoracentesis. Persistent pulmonary edema. Electronically Signed   By: Lavonia Dana M.D.   On: 06/08/2016 13:33   Dg Chest Port 1 View  Result Date: 06/08/2016 CLINICAL DATA:  Breast cancer. EXAM: PORTABLE CHEST 1 VIEW COMPARISON:  CT 06/07/2016 .  Chest x-ray 06/06/2016. FINDINGS: Mediastinum stable. Stable cardiomegaly. Persistent diffuse bilateral pulmonary infiltrates and/or edema. No pleural effusion or pneumothorax. Surgical clips left chest wall. IMPRESSION: 1. Persistent bilateral pulmonary infiltrates and or edema. No significant change. 2.  Stable cardiomegaly. Electronically Signed   By: Marcello Moores  Register   On: 06/08/2016 06:40    Assessment: 50 y.o. [place] woman status post left breast upper outer quadrant biopsy 05/09/2014 for a clinicalT1c N1, stage IIA invasive ductal carcinoma, grade 2, estrogen and progesterone receptor positive, HER-2 nonamplified, with an MIB-1 of 80%  (1) patient met with genetics counselor 05/29/2014 but decided against genetics testing   (2) tobacco abuse. The patient has been strongly urged to discontinue smoking. She was prescribed a nicotine patch 09/02/2014  (3) status post left lumpectomy and axillary lymph node dissection 06/11/2014 for a pT1c pN3, stage IIIC invasive ductal carcinoma, grade 3, with repeat HER-2 negative  (4) adjuvant chemotherapy consisting of doxorubicin and cyclophosphamide in dose dense fashion x4, completed 09/09/2014, followed by paclitaxel weekly x12 completed 03/09/2015  (5) adjuvant radiation 04/22/15-06/23/15 Left breast/ 45 Gy at 1.8 Gy per fraction x 25 fractions.  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 I am changing her from tamoxifen to letrozole-- discussed with her. Will add palbociclib as outpatient.  . I think she would benefit from a palliative care consult for pain management, but am leaving that to your discretion. Added codeine/phenergan syrup PRN for cough and brain MRI to complete her staging--wrote for ativan po on way to MRI.   Rad Onc consult in progress.  Will follow with you. Greatly appreciate your help to this patient!   Chauncey Cruel, MD 06/09/2016  8:11 AM Medical Oncology and Hematology [facility] [address] [place], [place] [address] Tel. [phone]  Fax. [phone] 

## 2016-06-09 NOTE — Progress Notes (Signed)
Name: Laura Matthews MRN: [id number] DOB: [birth year]    ADMISSION DATE:  06/06/2016 CONSULTATION DATE:  8/8  REFERRING MD :  Dr. Wynelle Cleveland  CHIEF COMPLAINT:  Cough, back pain w/ new effusion and hilar lung mass    REVIEW OF SYSTEMS:   Feels a little better  SUBJECTIVE:  Still hurting some but better.  VITAL SIGNS: Temp:  [98.4 F (36.9 C)-98.6 F (37 C)] 98.4 F (36.9 C) (08/10 0457) Pulse Rate:  [79-89] 79 (08/10 0457) Resp:  [20] 20 (08/10 0457) BP: (125-150)/(77-95) 150/91 (08/10 0457) SpO2:  [94 %-100 %] 94 % (08/10 0457)  PHYSICAL EXAMINATION: General:  Awake, alert, mild pain Neuro:  Nonfocal, perrl  HEENT:  jvd wnl, no nodes neck or axillary Back: no swelling, some point tenderness mild lower thoracic Cardiovascular:  s1 s2 RRR distant Lungs: crackles right base posterior, no accessory use  Abdomen:  Soft, BS wnl, no r/g Musculoskeletal:  No edema, no rash   Recent Labs Lab 06/07/16 0105 06/07/16 1543 06/08/16 0549  NA 138 139 137  K 3.9 4.0 3.6  CL 98* 105 101  CO2  --  29 28  BUN '12 10 8  '$ CREATININE 0.70 0.36* 0.40*  GLUCOSE 146* 125* 146*    Recent Labs Lab 06/07/16 0100 06/07/16 0105  HGB 11.0* 11.9*  HCT 33.2* 35.0*  WBC 10.6*  --   PLT 446*  --    Dg Chest Port 1 View  Result Date: 06/08/2016 CLINICAL DATA:  RIGHT pleural effusion post thoracentesis EXAM: PORTABLE CHEST 1 VIEW COMPARISON:  Portable exam 1310 hours compared earlier study of 01/1939 2 hours FINDINGS: Minimal enlargement of cardiac silhouette. Slight pulmonary vascular congestion with persistent pulmonary edema unchanged. Improved aeration RIGHT base. No pneumothorax following thoracentesis. Surgical clips RIGHT axilla. No acute osseous findings. IMPRESSION: No pneumothorax following RIGHT thoracentesis. Persistent pulmonary edema. Electronically Signed   By: Lavonia Dana M.D.   On: 06/08/2016 13:33   Dg Chest Port 1 View  Result Date: 06/08/2016 CLINICAL DATA:  Breast cancer.  EXAM: PORTABLE CHEST 1 VIEW COMPARISON:  CT 06/07/2016 .  Chest x-ray 06/06/2016. FINDINGS: Mediastinum stable. Stable cardiomegaly. Persistent diffuse bilateral pulmonary infiltrates and/or edema. No pleural effusion or pneumothorax. Surgical clips left chest wall. IMPRESSION: 1. Persistent bilateral pulmonary infiltrates and or edema. No significant change. 2.  Stable cardiomegaly. Electronically Signed   By: Marcello Moores  Register   On: 06/08/2016 06:40   Site: right  Date: 8/9  Pleural fluid Serum   LDH: 180 LDH: 190  Protein: <3 Protein: 7.3  Cholesterol: >>> Cholesterol: 177  Culture: few wbc, no orgs>>>   Color: yellow/clear   Wbc: 674   Neutrophils:  4   Lymph: 88   Ph: >>>   Rheumatoid factor: negative    Adenosine Deaminase:>>>   Glucose: 170   Cytology: >>>      Special notes:       PCXR 8/10: diffuse pulmonary markings. No obvious effusion      ASSESSMENT / PLAN:  Breast Ca suspect now stage IV R>L Pleural effusion, r/o malignant-->s/p thoracentesis 8/9 (1 liter removed)-->Exudative  Lung mass Right hilum  Possible lymphangitic spread  Probable mets to spine.  Mild systolic CM (EF 77% w/ hypokinesis of basal-midinferior & inferoseptal myocardium) Grade I diastolic dysfunction  Plan F/u cytology  scheduled toradol F/u CXR Depending on cytology will decide on bx of hilar lung mass and possibly transbronchial bx to help identify lymphangitic spread.  IM service has started  decadron-->we will see if this helps w/ infiltrates on CXR and cough   Erick Colace ACNP-BC [place] Pager # [phone] OR # [phone] if no answer   06/09/2016, 10:39 AM  PCCM Attending Note: Patient seen and examined with nurse practitioner. Please refer to his progress note which I have reviewed in detail. Dyspnea has improved but remained stable.Patient reports continued nonproductive cough. Chest discomfort unchanged. No subjective fever or chills.  Review of  systems: No rashes or abnormal bruising. No nausea or vomiting. No headache or vision changes.  BP (!) 142/89 (BP Location: Right Arm)   Pulse 72   Temp 98.1 F (36.7 C) (Oral)   Resp 18   Ht '5\' 10"'$  (1.778 m)   Wt 158 lb 15.2 oz (72.1 kg)   SpO2 97%   BMI 22.81 kg/m  Gen:  Awake. Family at bedside. No distress. Psychiatric:  A&Ox4. Mood and affect congruent. Pleasant demeanor.  CTA Chest: reviewed with patient at bedside as well as other family members. Right hilar mass as well as right pleural effusion.Lytic vertebral lesions also demonstrated to patient.  A/P:50 year old female with discordant exudative right pleural effusion and right hilar mass with possible lymphangitic spread of stage IV lung cancer. Excellent symptomatic relief with thoracentesis. Awaiting pleural fluid cytology. If cytology is negative would consider bronchoscopy with endobronchial ultrasound-guided fine-needle aspiration of right hilar mass and probable transbronchial biopsy as well.  Sonia Baller Ashok Cordia, M.D. [facility] Pager:  [phone] After 3pm or if no response, call [phone] 10:39 AM 06/09/16

## 2016-06-09 NOTE — Care Management Note (Signed)
Case Management Note  Patient Details  Name: GWENDOLYN MCLEES MRN: [id number] Date of Birth: [birth year]  Subjective/Objective:Noted home 02 ordered. no documented 02 sats. Nurse aware. [facility] dme rep Jermaine aware to await documented 02 sats for qualification, & d/c prior to bring home 02 travel tank to rm. Patient will provide choice of [place] agency tomorrow-recc HHRN.                    Action/Plan:d/c home w/HHC.   Expected Discharge Date:                  Expected Discharge Plan:  [place]  In-House Referral:     Discharge planning Services  CM Consult  Post Acute Care Choice:    Choice offered to:  Patient  DME Arranged:    DME Agency:     HH Arranged:    [place] Agency:     Status of Service:  In process, will continue to follow  If discussed at Long Length of Stay Meetings, dates discussed:    Additional Comments:  Randall, [place], RN 06/09/2016, 3:55 PM

## 2016-06-10 ENCOUNTER — Telehealth: Payer: Self-pay | Admitting: *Deleted

## 2016-06-10 ENCOUNTER — Ambulatory Visit: Payer: Self-pay | Admitting: Oncology

## 2016-06-10 ENCOUNTER — Ambulatory Visit
Admit: 2016-06-10 | Discharge: 2016-06-10 | Disposition: A | Discharge status: Home / Self Care | Payer: Medicaid Other | Attending: Radiation Oncology | Admitting: Radiation Oncology

## 2016-06-10 DIAGNOSIS — C50412 Malignant neoplasm of upper-outer quadrant of left female breast: Secondary | ICD-10-CM

## 2016-06-10 DIAGNOSIS — E43 Unspecified severe protein-calorie malnutrition: Secondary | ICD-10-CM

## 2016-06-10 DIAGNOSIS — C7800 Secondary malignant neoplasm of unspecified lung: Secondary | ICD-10-CM

## 2016-06-10 DIAGNOSIS — C7951 Secondary malignant neoplasm of bone: Secondary | ICD-10-CM | POA: Insufficient documentation

## 2016-06-10 DIAGNOSIS — M546 Pain in thoracic spine: Secondary | ICD-10-CM

## 2016-06-10 DIAGNOSIS — C7931 Secondary malignant neoplasm of brain: Secondary | ICD-10-CM

## 2016-06-10 DIAGNOSIS — Z9889 Other specified postprocedural states: Secondary | ICD-10-CM

## 2016-06-10 LAB — GLUCOSE, CAPILLARY
GLUCOSE-CAPILLARY: 252 mg/dL — AB (ref 65–99)
GLUCOSE-CAPILLARY: 218 mg/dL — AB (ref 65–99)
GLUCOSE-CAPILLARY: 201 mg/dL — AB (ref 65–99)
Glucose-Capillary: 266 mg/dL — ABNORMAL HIGH (ref 65–99)

## 2016-06-10 MED ORDER — HYDROMORPHONE HCL 1 MG/ML IJ SOLN
1.0000 mg | Freq: Once | INTRAMUSCULAR | Status: AC
  Administered 2016-06-10: 1 mg via INTRAVENOUS

## 2016-06-10 MED ORDER — METHADONE HCL 5 MG PO TABS: 5.0000 mg | ORAL_TABLET | Freq: Three times a day (TID) | ORAL | Status: DC

## 2016-06-10 MED ORDER — FENTANYL 75 MCG/HR TD PT72
75.0000 ug | MEDICATED_PATCH | TRANSDERMAL | Status: DC
  Administered 2016-06-10: 75 ug via TRANSDERMAL
  Filled 2016-06-10: qty 1

## 2016-06-10 MED ORDER — GLUCERNA SHAKE PO LIQD
237.0000 mL | Freq: Three times a day (TID) | ORAL | Status: DC
  Administered 2016-06-10 – 2016-06-12 (×3): 237 mL via ORAL
  Filled 2016-06-10 (×7): qty 237

## 2016-06-10 MED ORDER — GLIPIZIDE ER 10 MG PO TB24
10.0000 mg | ORAL_TABLET | Freq: Every day | ORAL | Status: DC
  Administered 2016-06-11 – 2016-06-12 (×2): 10 mg via ORAL
  Filled 2016-06-10 (×2): qty 1

## 2016-06-10 NOTE — Progress Notes (Signed)
PROGRESS NOTE    Laura Matthews  KVQ:[id number] DOB: [birth year] DOA: 06/06/2016  PCP: [name], MD   Brief Narrative:  50 y/o with DM 2, breast cancer s/p treatment  With chemo, surgery and radiation who is being monitored by Dr [name] who has had a cough for a few weeks now and was treated as outpt with Levaquin but did not improve. Cough is severe and on the most part non-productive worse with taking deep breaths. Recent CT of the chest suggested lymphogenic spread of cancer with mets in the visualized spine.  Also has had back pain and as mentioned, has mets in her spine.   Subjective: Quite disturbed by the findings that she has metastasis to the brain. Continues to have pain. Cough better but has not resolved.   Assessment & Plan:   Principal Problem:   Acute respiratory failure with hypoxia  - likely due to metastatic cancer although underlying infection cannot be completely occluded- has not responded to Levaquin - cont O2 - Tussionex and Tessalon for cough - have contacted oncology and pulmonary - right thoracentesis done and sent for cytology- if this is negative, she will have a biopsy done as outpt to obtain tissue diagnosis - have ordered home O2  Active Problems:   Breast cancer of upper-outer quadrant of left female breast with metastasis to spine and brain - as above, oncology consulting- further work up underway - will have radiation to back and brain mets  Back pain with metastasis to spine   - Dilaudid and Percocet  >>  added Fentanyl patch 75 mcg to reduce use of PRN pain meds - added steroids - Added voltaren gel for chest wall pain - to be assessed by rad onc  Dehydration - resolved  Anxiety In relation to diagnosis- Ativan  Diabetes mellitus without complication  - sliding scale insulin- resumed Glipizide as steroids being started - will need to increase dose as sugars elevated   Protein-calorie malnutrition, moderate  - supplements  ordered  DVT prophylaxis: Lovenox Code Status: Full Family Communication:  Disposition Plan: can be discharged once pain is controlled Consultants:   Oncology  Pulmonary  Procedures:  None  Antimicrobials:  Anti-infectives    None       Objective: Vitals:   06/09/16 1332 06/09/16 2115 06/10/16 0421 06/10/16 1429  BP: (!) 142/89 136/83 (!) 155/74 (!) 154/88  Pulse: 72 85 82 87  Resp: '18 16 18 20  '$ Temp: 98.1 F (36.7 C) 98.3 F (36.8 C) 98.4 F (36.9 C) 98.2 F (36.8 C)  TempSrc: Oral Oral Oral Oral  SpO2: 97% 93% 95% 95%  Weight:      Height:        Intake/Output Summary (Last 24 hours) at 06/10/16 1449 Last data filed at 06/10/16 1300  Gross per 24 hour  Intake              720 ml  Output                0 ml  Net              720 ml   Filed Weights   06/06/16 2144 06/07/16 0431  Weight: 69.9 kg (154 lb) 72.1 kg (158 lb 15.2 oz)    Examination: General exam: Appears comfortable HEENT: PERRLA, oral mucosa moist, no sclera icterus or thrush Respiratory system: decreased breath sounds-  Respiratory effort normal  Cardiovascular system: S1 & S2 heard, RRR.  No murmurs  Gastrointestinal  system: Abdomen soft, non-tender, nondistended. Normal bowel sound. No organomegaly Central nervous system: Alert and oriented. No focal neurological deficits. Extremities: No cyanosis, clubbing or edema Skin: No rashes or ulcers Psychiatry:  Mood & affect appropriate.     Data Reviewed: I have personally reviewed following labs and imaging studies  CBC:  Recent Labs Lab 06/07/16 0100 06/07/16 0105  WBC 10.6*  --   NEUTROABS 8.1*  --   HGB 11.0* 11.9*  HCT 33.2* 35.0*  MCV 86.5  --   PLT 446*  --    Basic Metabolic Panel:  Recent Labs Lab 06/07/16 0105 06/07/16 1543 06/08/16 0549  NA 138 139 137  K 3.9 4.0 3.6  CL 98* 105 101  CO2  --  29 28  GLUCOSE 146* 125* 146*  BUN '12 10 8  '$ CREATININE 0.70 0.36* 0.40*  CALCIUM  --  8.3* 8.9  MG  --   --  1.8    PHOS  --   --  3.9   GFR: Estimated Creatinine Clearance: 93 mL/min (by C-G formula based on SCr of 0.8 mg/dL). Liver Function Tests:  Recent Labs Lab 06/08/16 1324  PROT 6.7   No results for input(s): LIPASE, AMYLASE in the last 168 hours. No results for input(s): AMMONIA in the last 168 hours. Coagulation Profile:  Recent Labs Lab 06/07/16 1732  INR 1.07   Cardiac Enzymes: No results for input(s): CKTOTAL, CKMB, CKMBINDEX, TROPONINI in the last 168 hours. BNP (last 3 results) No results for input(s): PROBNP in the last 8760 hours. HbA1C: No results for input(s): HGBA1C in the last 72 hours. CBG:  Recent Labs Lab 06/09/16 1147 06/09/16 1601 06/09/16 2107 06/10/16 0743 06/10/16 1125  GLUCAP 213* 216* 173* 266* 252*   Lipid Profile:  Recent Labs  06/08/16 1324  CHOL 177   Thyroid Function Tests: No results for input(s): TSH, T4TOTAL, FREET4, T3FREE, THYROIDAB in the last 72 hours. Anemia Panel: No results for input(s): VITAMINB12, FOLATE, FERRITIN, TIBC, IRON, RETICCTPCT in the last 72 hours. Urine analysis:    Component Value Date/Time   COLORURINE YELLOW 06/03/2014 1050   APPEARANCEUR CLEAR 06/03/2014 1050   LABSPEC >1.030 (H) 06/03/2014 1050   PHURINE 6.0 06/03/2014 1050   GLUCOSEU NEGATIVE 06/03/2014 1050   HGBUR SMALL (A) 06/03/2014 1050   BILIRUBINUR SMALL (A) 06/03/2014 1050   KETONESUR NEGATIVE 06/03/2014 1050   PROTEINUR NEGATIVE 06/03/2014 1050   UROBILINOGEN 0.2 06/03/2014 1050   NITRITE NEGATIVE 06/03/2014 1050   LEUKOCYTESUR TRACE (A) 06/03/2014 1050   Sepsis Labs: '@LABRCNTIP'$ (procalcitonin:4,lacticidven:4) ) Recent Results (from the past 240 hour(s))  Body fluid culture     Status: None (Preliminary result)   Collection Time: 06/08/16  1:00 PM  Result Value Ref Range Status   Specimen Description PLEURAL FLUID  Final   Special Requests NONE  Final   Gram Stain   Final    FEW WBC PRESENT,BOTH PMN AND MONONUCLEAR NO ORGANISMS  SEEN    Culture   Final    NO GROWTH 2 DAYS Performed at [facility]    Report Status PENDING  Incomplete         Radiology Studies: [name] Wo Contrast  Result Date: 06/09/2016 CLINICAL DATA:  Metastatic breast cancer. EXAM: MRI HEAD WITHOUT AND WITH CONTRAST TECHNIQUE: Multiplanar, multiecho pulse sequences of the brain and surrounding structures were obtained without and with intravenous contrast. CONTRAST:  22m MULTIHANCE GADOBENATE DIMEGLUMINE 529 MG/ML IV SOLN COMPARISON:  06/02/2014 FINDINGS: No definite  acute infarct is identified. There are a few punctate foci of scattered trace diffusion signal abnormality in both cerebral hemispheres, most notably in the posterior left frontal lobe and right temporal lobe without clearly restricted diffusion. No intracranial hemorrhage, midline shift, or extra-axial fluid collection is present. The ventricles and sulci are normal in size. There are 12-15 subcentimeter enhancing lesions in the cerebellar hemispheres measuring up to 5 mm in size. Two 1 mm enhancing lesions are questioned in the right parietal lobe (series 11, images 31 and 35, not clearly confirmed on coronal or sagittal sequences). There is a 2 mm enhancing lesion more superiorly and medially in the right parietal lobe (series 11, image 39). A 7 mm enhancing lesion is present in the left frontal lobe (series 11, image 40). There is a 2 mm enhancing lesion in the anterior inferior left temporal lobe (series 12, image 15 and series 13, image 19). A 1 mm lesion is questioned in the posterior left temporal lobe adjacent to the temporal horn (series 12, image 10). Mild motion artifact on postcontrast sequences could potentially obscure additional tiny lesions. There is no significant enhancement associated with any of the lesions. Orbits are unremarkable. Paranasal sinuses and mastoid air cells are clear. Major intracranial vascular flow voids are preserved. No destructive osseous  lesion is identified. IMPRESSION: Numerous (15-20) subcentimeter enhancing lesions consistent with metastases and with greatest involvement of the cerebellum. No edema. Motion artifact may obscure additional tiny lesions. Electronically Signed   By: Logan Bores M.D.   On: 06/09/2016 20:54   Dg Chest Port 1 View  Result Date: 06/09/2016 CLINICAL DATA:  Follow-up pleural effusion.  Breast cancer. EXAM: PORTABLE CHEST 1 VIEW COMPARISON:  Chest radiograph from one day prior. FINDINGS: Surgical clips throughout the left axilla and left lateral breast are again noted. Stable cardiomediastinal silhouette with normal heart size. No pneumothorax. Stable trace bilateral pleural effusions. Hazy and linear opacities throughout both lungs are not appreciably changed. IMPRESSION: 1. No pneumothorax. 2. Stable trace bilateral pleural effusions. 3. Stable hazy and linear opacities throughout both lungs. Given the normal heart size and history of breast cancer, this finding raises concern for lymphangitic tumor as noted on recent chest CT studies. Electronically Signed   By: Ilona Sorrel M.D.   On: 06/09/2016 12:31      Scheduled Meds: . benzonatate  200 mg Oral TID  . chlorpheniramine-HYDROcodone  5 mL Oral Q12H  . dexamethasone  4 mg Oral Q8H  . diclofenac sodium  2 g Topical QID  . enoxaparin (LOVENOX) injection  40 mg Subcutaneous Q24H  . feeding supplement (ENSURE ENLIVE)  237 mL Oral TID BM  . fentaNYL  75 mcg Transdermal Q72H  . glipiZIDE  5 mg Oral Q breakfast  . insulin aspart  0-5 Units Subcutaneous QHS  . insulin aspart  0-9 Units Subcutaneous TID WC  . letrozole  2.5 mg Oral Daily  . multivitamin with minerals  1 tablet Oral Daily  . polyethylene glycol  17 g Oral BID  . sodium chloride flush  3 mL Intravenous Q12H   Continuous Infusions:    LOS: 2 days    Time spent in minutes: [place], MD [facility] Pager: [email or web address] Password [facility] 06/10/2016, 2:49 PM

## 2016-06-10 NOTE — Care Management Note (Signed)
Case Management Note  Patient Details  Name: Laura Matthews MRN: [id number] Date of Birth: 1966-07-20  Subjective/Objective: Per attending no HHC needs. No further CM needs.                   Action/Plan:d/c plan home.   Expected Discharge Date:                  Expected Discharge Plan:  Home/Self Care  In-House Referral:     Discharge planning Services  CM Consult  Post Acute Care Choice:    Choice offered to:  Patient  DME Arranged:    DME Agency:     HH Arranged:    [place] Agency:     Status of Service:  In process, will continue to follow  If discussed at Long Length of Stay Meetings, dates discussed:    Additional Comments:  [place], [place], RN 06/10/2016, 4:14 PM

## 2016-06-10 NOTE — Progress Notes (Signed)
ADDENDUM:  Cytology from pleural fluid is positive for adenocarcinoma, estrogen receptor positive. This is metastatic breast cancer, as expected. The full prognostic panel is pending.  I alerted radiation oncology to the brain MRI results-- they should be complteting the consult today. Anticipate CNS radiation over 3 weeks.  It may be a good idea to ask SW to confirm that patient's pain medications will be on her insurance's formulary--some insurers will not approve more expensive meds (eg fentanyl patches) unless there is documented failure of less expensive options.  Again, thank you for your help to this patient!

## 2016-06-10 NOTE — Progress Notes (Signed)
Name: Laura Matthews MRN: [id number] DOB: 1965/11/16    ADMISSION DATE:  06/06/2016 CONSULTATION DATE:  8/8  REFERRING MD :  Dr. Wynelle Cleveland  CHIEF COMPLAINT:  Cough, back pain w/ new effusion and hilar lung mass    REVIEW OF SYSTEMS:   Feels a little better  SUBJECTIVE:  Still hurting some  VITAL SIGNS: Temp:  [98.1 F (36.7 C)-98.4 F (36.9 C)] 98.4 F (36.9 C) (08/11 0421) Pulse Rate:  [72-85] 82 (08/11 0421) Resp:  [16-18] 18 (08/11 0421) BP: (136-155)/(74-89) 155/74 (08/11 0421) SpO2:  [93 %-97 %] 95 % (08/11 0421) Weight:  [158 lb (71.7 kg)] 158 lb (71.7 kg) (08/10 1942) Room air  PHYSICAL EXAMINATION: General:  Awake, alert, mild pain Neuro:  Nonfocal, perrl  HEENT:  jvd wnl, no nodes neck or axillary Back: no swelling, some point tenderness mild lower thoracic Cardiovascular:  s1 s2 RRR distant Lungs: crackles right base posterior, no accessory use no distress Abdomen:  Soft, BS wnl, no r/g Musculoskeletal:  No edema, no rash   Recent Labs Lab 06/07/16 0105 06/07/16 1543 06/08/16 0549  NA 138 139 137  K 3.9 4.0 3.6  CL 98* 105 101  CO2  --  29 28  BUN '12 10 8  '$ CREATININE 0.70 0.36* 0.40*  GLUCOSE 146* 125* 146*    Recent Labs Lab 06/07/16 0100 06/07/16 0105  HGB 11.0* 11.9*  HCT 33.2* 35.0*  WBC 10.6*  --   PLT 446*  --    [name] Wo Contrast  Result Date: 06/09/2016 CLINICAL DATA:  Metastatic breast cancer. EXAM: MRI HEAD WITHOUT AND WITH CONTRAST TECHNIQUE: Multiplanar, multiecho pulse sequences of the brain and surrounding structures were obtained without and with intravenous contrast. CONTRAST:  40m MULTIHANCE GADOBENATE DIMEGLUMINE 529 MG/ML IV SOLN COMPARISON:  06/02/2014 FINDINGS: No definite acute infarct is identified. There are a few punctate foci of scattered trace diffusion signal abnormality in both cerebral hemispheres, most notably in the posterior left frontal lobe and right temporal lobe without clearly restricted diffusion. No  intracranial hemorrhage, midline shift, or extra-axial fluid collection is present. The ventricles and sulci are normal in size. There are 12-15 subcentimeter enhancing lesions in the cerebellar hemispheres measuring up to 5 mm in size. Two 1 mm enhancing lesions are questioned in the right parietal lobe (series 11, images 31 and 35, not clearly confirmed on coronal or sagittal sequences). There is a 2 mm enhancing lesion more superiorly and medially in the right parietal lobe (series 11, image 39). A 7 mm enhancing lesion is present in the left frontal lobe (series 11, image 40). There is a 2 mm enhancing lesion in the anterior inferior left temporal lobe (series 12, image 15 and series 13, image 19). A 1 mm lesion is questioned in the posterior left temporal lobe adjacent to the temporal horn (series 12, image 10). Mild motion artifact on postcontrast sequences could potentially obscure additional tiny lesions. There is no significant enhancement associated with any of the lesions. Orbits are unremarkable. Paranasal sinuses and mastoid air cells are clear. Major intracranial vascular flow voids are preserved. No destructive osseous lesion is identified. IMPRESSION: Numerous (15-20) subcentimeter enhancing lesions consistent with metastases and with greatest involvement of the cerebellum. No edema. Motion artifact may obscure additional tiny lesions. Electronically Signed   By: ALogan BoresM.D.   On: 06/09/2016 20:54   Dg Chest Port 1 View  Result Date: 06/09/2016 CLINICAL DATA:  Follow-up pleural effusion.  Breast cancer.  EXAM: PORTABLE CHEST 1 VIEW COMPARISON:  Chest radiograph from one day prior. FINDINGS: Surgical clips throughout the left axilla and left lateral breast are again noted. Stable cardiomediastinal silhouette with normal heart size. No pneumothorax. Stable trace bilateral pleural effusions. Hazy and linear opacities throughout both lungs are not appreciably changed. IMPRESSION: 1. No  pneumothorax. 2. Stable trace bilateral pleural effusions. 3. Stable hazy and linear opacities throughout both lungs. Given the normal heart size and history of breast cancer, this finding raises concern for lymphangitic tumor as noted on recent chest CT studies. Electronically Signed   By: Ilona Sorrel M.D.   On: 06/09/2016 12:31   Dg Chest Port 1 View  Result Date: 06/08/2016 CLINICAL DATA:  RIGHT pleural effusion post thoracentesis EXAM: PORTABLE CHEST 1 VIEW COMPARISON:  Portable exam 1310 hours compared earlier study of 01/1939 2 hours FINDINGS: Minimal enlargement of cardiac silhouette. Slight pulmonary vascular congestion with persistent pulmonary edema unchanged. Improved aeration RIGHT base. No pneumothorax following thoracentesis. Surgical clips RIGHT axilla. No acute osseous findings. IMPRESSION: No pneumothorax following RIGHT thoracentesis. Persistent pulmonary edema. Electronically Signed   By: Lavonia Dana M.D.   On: 06/08/2016 13:33   Site: right  Date: 8/9  Pleural fluid Serum   LDH: 180 LDH: 190  Protein: <3 Protein: 7.3  Cholesterol: >>> Cholesterol: 177  Culture: few wbc, no orgs>>>   Color: yellow/clear   Wbc: 674   Neutrophils:  4   Lymph: 88   Ph: >>> 8.0   Rheumatoid factor: negative    Adenosine Deaminase:    Glucose: 170   Cytology: + metastatic adeno->C/W her breast CA (per pathology)      Special notes:       PCXR 8/10: diffuse pulmonary markings. No obvious effusion      ASSESSMENT / PLAN:  Breast Ca suspect now stage IV Malignant Pleural effusion c/w her breast CA per Path Lung mass Right hilum  Possible lymphangitic spread  Probable mets to spine.  Mild systolic CM (EF 23% w/ hypokinesis of basal-midinferior & inferoseptal myocardium) Grade I diastolic dysfunction  Plan Plan to go to radiation therapy for simulation  IM service has started decadron-->we will see if this helps w/ infiltrates on CXR and cough  If effusion re-occurs would consider  Pleur-x  PCCM will s/o  Erick Colace ACNP-BC [place] Pager # [phone] OR # [phone] if no answer   06/10/2016, 9:40 AM  PCCM Attending Note: Patient seen & examined with nurse practitioner. Please refer to his progress note which I have reviewed in detail. Patient comfortable with nephew at bedside. Continues to have ongoing pain. She reports fatigue. She is aware of final pathology from her pleural effusion. At this time I would recommend an indwelling pleural catheter if her effusion reaccumulates rapidly for palliation of symptoms. If there are any further questions please do not hesitate to contact the on-call physician. At this time PCCM will sign off.  Sonia Baller Ashok Cordia, M.D. [facility] Pulmonary & Critical Care Pager:  [phone] After 3pm or if no response, call [phone] 11:58 AM 06/10/16

## 2016-06-10 NOTE — Care Management Note (Signed)
Case Management Note  Patient Details  Name: Laura Matthews MRN: [id number] Date of Birth: 04-Dec-1965  Subjective/Objective:  Noted 02 sats doesn't qualify for home 02.Will await patient's choice for [facility] agency-would recc HHRN-disease mgmnt-await HHRN order,[id number]                  Action/Plan:d/c home.   Expected Discharge Date:                  Expected Discharge Plan:  [facility] Ref[facility]:     Discharge planning Services  CM Consult  Post Acute Care Choice:    Choice offered to:  Patient  DME Arranged:    DME Agency:     [facility]    [place] [facility] [place]     Status of Service:  In process, will continue to follow  If discussed at Long Length of Stay Meetings, dates discussed:    Additional Comments:  MBirttany, Dechellis RN 06/10/2016, 12:12 PM

## 2016-06-10 NOTE — Progress Notes (Signed)
Inpatient Diabetes Program Recommendations  AACE/ADA: New Consensus Statement on Inpatient Glycemic Control (2015)  Target Ranges:  Prepandial:   less than 140 mg/dL      Peak postprandial:   less than 180 mg/dL (1-2 hours)      Critically ill patients:  140 - 180 mg/dL   Lab Results  Component Value Date   GLUCAP 218 (H) 06/10/2016   HGBA1C 8.5 (H) 07/01/2015    Review of Glycemic Control  Results for Laura Matthews, Laura Matthews (MRN [id number]) as of 06/10/2016 17:33  Ref. Range 06/09/2016 16:01 06/09/2016 21:07 06/10/2016 07:43 06/10/2016 11:25 06/10/2016 16:41  Glucose-Capillary Latest Ref Range: 65 - 99 mg/dL 216 (H) 173 (H) 266 (H) 252 (H) 218 (H)   Blood sugars in 200s. Will likely benefit from insulin adjustment.  Inpatient Diabetes Program Recommendations:   While on steroids, increase Novolog to moderate tidwc and hs. Add Novolog 2 units tidwc for meal coverage insulin if pt eats > 50% meal.  Will continue to follow. Thank you. Lorenda Peck, RD, LDN, CDE Inpatient Diabetes Coordinator [phone]

## 2016-06-10 NOTE — Progress Notes (Signed)
Laura Matthews   DOB:20-Nov-1965   TS#:[id number]   SPQ#:[id number]  Subjective: .still coughing "a lot," still hurting; interestingly no h/a, N/V complaints; nephew in room  Objective: middle aged African American woman examined in bed Vitals:   06/09/16 2115 06/10/16 0421  BP: 136/83 (!) 155/74  Pulse: 85 82  Resp: 16 18  Temp: 98.3 F (36.8 C) 98.4 F (36.9 C)    Body mass index is 22.81 kg/m.  Intake/Output Summary (Last 24 hours) at 06/10/16 0834 Last data filed at 06/09/16 2148  Gross per 24 hour  Intake              480 ml  Output                0 ml  Net              480 ml     Sclerae unicteric  Lungs no rubs--coughing during exam  Heart regular rate and rhythm  Abdomen soft, +BS  Continuing right rib tenderness  Neuro nonfocal  Breast exam: left breast skin changes secondary to radiation  CBG (last 3)   Recent Labs  06/09/16 1601 06/09/16 2107 06/10/16 0743  GLUCAP 216* 173* 266*     Labs:  Lab Results  Component Value Date   WBC 10.6 (H) 06/07/2016   HGB 11.9 (L) 06/07/2016   HCT 35.0 (L) 06/07/2016   MCV 86.5 06/07/2016   PLT 446 (H) 06/07/2016   NEUTROABS 8.1 (H) 06/07/2016    _0 @  Urine Studies No results for input(s): UHGB, CRYS in the last 72 hours.  Invalid input(s): UACOL, UAPR, USPG, UPH, UTP, UGL, UKET, UBIL, UNIT, UROB, ULEU, UEPI, UWBC, URBC, UBAC, CAST, [employer], [place]  Basic Metabolic Panel:  Recent Labs Lab 06/07/16 0105 06/07/16 1543 06/08/16 0549  NA 138 139 137  K 3.9 4.0 3.6  CL 98* 105 101  CO2  --  29 28  GLUCOSE 146* 125* 146*  BUN _1 CREATININE 0.70 0.36* 0.40*  CALCIUM  --  8.3* 8.9  MG  --   --  1.8  PHOS  --   --  3.9   GFR Estimated Creatinine Clearance: 93 mL/min (by C-G formula based on SCr of 0.8 mg/dL). Liver Function Tests:  Recent Labs Lab 06/08/16 1324  PROT 6.7   No results for input(s): LIPASE, AMYLASE in the last 168 hours. No results for input(s): AMMONIA in the last 168  hours. Coagulation profile  Recent Labs Lab 06/07/16 1732  INR 1.07    CBC:  Recent Labs Lab 06/07/16 0100 06/07/16 0105  WBC 10.6*  --   NEUTROABS 8.1*  --   HGB 11.0* 11.9*  HCT 33.2* 35.0*  MCV 86.5  --   PLT 446*  --    Cardiac Enzymes: No results for input(s): CKTOTAL, CKMB, CKMBINDEX, TROPONINI in the last 168 hours. BNP: Invalid input(s): POCBNP CBG:  Recent Labs Lab 06/09/16 0738 06/09/16 1147 06/09/16 1601 06/09/16 2107 06/10/16 0743  GLUCAP 142* 213* 216* 173* 266*   D-Dimer No results for input(s): DDIMER in the last 72 hours. Hgb A1c No results for input(s): HGBA1C in the last 72 hours. Lipid Profile  Recent Labs  06/08/16 1324  CHOL 177   Thyroid function studies No results for input(s): TSH, T4TOTAL, T3FREE, THYROIDAB in the last 72 hours.  Invalid input(s): FREET3 Anemia work up No results for input(s): VITAMINB12, FOLATE, FERRITIN, TIBC, IRON, RETICCTPCT in the last 72 hours. Microbiology  Recent Results (from the past 240 hour(s))  Body fluid culture     Status: None (Preliminary result)   Collection Time: 06/08/16  1:00 PM  Result Value Ref Range Status   Specimen Description PLEURAL FLUID  Final   Special Requests NONE  Final   Gram Stain   Final    FEW WBC PRESENT,BOTH PMN AND MONONUCLEAR NO ORGANISMS SEEN    Culture   Final    NO GROWTH < 24 HOURS Performed at [facility]    Report Status PENDING  Incomplete      Studies:  [name] Wo Contrast  Result Date: 06/09/2016 CLINICAL DATA:  Metastatic breast cancer. EXAM: MRI HEAD WITHOUT AND WITH CONTRAST TECHNIQUE: Multiplanar, multiecho pulse sequences of the brain and surrounding structures were obtained without and with intravenous contrast. CONTRAST:  32m MULTIHANCE GADOBENATE DIMEGLUMINE 529 MG/ML IV SOLN COMPARISON:  06/02/2014 FINDINGS: No definite acute infarct is identified. There are a few punctate foci of scattered trace diffusion signal abnormality in  both cerebral hemispheres, most notably in the posterior left frontal lobe and right temporal lobe without clearly restricted diffusion. No intracranial hemorrhage, midline shift, or extra-axial fluid collection is present. The ventricles and sulci are normal in size. There are 12-15 subcentimeter enhancing lesions in the cerebellar hemispheres measuring up to 5 mm in size. Two 1 mm enhancing lesions are questioned in the right parietal lobe (series 11, images 31 and 35, not clearly confirmed on coronal or sagittal sequences). There is a 2 mm enhancing lesion more superiorly and medially in the right parietal lobe (series 11, image 39). A 7 mm enhancing lesion is present in the left frontal lobe (series 11, image 40). There is a 2 mm enhancing lesion in the anterior inferior left temporal lobe (series 12, image 15 and series 13, image 19). A 1 mm lesion is questioned in the posterior left temporal lobe adjacent to the temporal horn (series 12, image 10). Mild motion artifact on postcontrast sequences could potentially obscure additional tiny lesions. There is no significant enhancement associated with any of the lesions. Orbits are unremarkable. Paranasal sinuses and mastoid air cells are clear. Major intracranial vascular flow voids are preserved. No destructive osseous lesion is identified. IMPRESSION: Numerous (15-20) subcentimeter enhancing lesions consistent with metastases and with greatest involvement of the cerebellum. No edema. Motion artifact may obscure additional tiny lesions. Electronically Signed   By: ALogan BoresM.D.   On: 06/09/2016 20:54   Dg Chest Port 1 View  Result Date: 06/09/2016 CLINICAL DATA:  Follow-up pleural effusion.  Breast cancer. EXAM: PORTABLE CHEST 1 VIEW COMPARISON:  Chest radiograph from one day prior. FINDINGS: Surgical clips throughout the left axilla and left lateral breast are again noted. Stable cardiomediastinal silhouette with normal heart size. No pneumothorax. Stable  trace bilateral pleural effusions. Hazy and linear opacities throughout both lungs are not appreciably changed. IMPRESSION: 1. No pneumothorax. 2. Stable trace bilateral pleural effusions. 3. Stable hazy and linear opacities throughout both lungs. Given the normal heart size and history of breast cancer, this finding raises concern for lymphangitic tumor as noted on recent chest CT studies. Electronically Signed   By: JIlona SorrelM.D.   On: 06/09/2016 12:31   Dg Chest Port 1 View  Result Date: 06/08/2016 CLINICAL DATA:  RIGHT pleural effusion post thoracentesis EXAM: PORTABLE CHEST 1 VIEW COMPARISON:  Portable exam 1310 hours compared earlier study of 01/1939 2 hours FINDINGS: Minimal enlargement of cardiac silhouette. Slight pulmonary vascular congestion  with persistent pulmonary edema unchanged. Improved aeration RIGHT base. No pneumothorax following thoracentesis. Surgical clips RIGHT axilla. No acute osseous findings. IMPRESSION: No pneumothorax following RIGHT thoracentesis. Persistent pulmonary edema. Electronically Signed   By: Lavonia Dana M.D.   On: 06/08/2016 13:33    Assessment: 50 y.o. [place] woman status post left breast upper outer quadrant biopsy 05/09/2014 for a clinicalT1c N1, stage IIA invasive ductal carcinoma, grade 2, estrogen and progesterone receptor positive, HER-2 nonamplified, with an MIB-1 of 80%  (1) patient met with genetics counselor 05/29/2014 but decided against genetics testing   (2) tobacco abuse. The patient has been strongly urged to discontinue smoking. She was prescribed a nicotine patch 09/02/2014  (3) status post left lumpectomy and axillary lymph node dissection 06/11/2014 for a pT1c pN3, stage IIIC invasive ductal carcinoma, grade 3, with repeat HER-2 negative  (4) adjuvant chemotherapy consisting of doxorubicin and cyclophosphamide in dose dense fashion x4, completed 09/09/2014, followed by paclitaxel weekly x12 completed 03/09/2015  (5) adjuvant  radiation 04/22/15-06/23/15 Left breast/ 45 Gy at 1.8 Gy per fraction x 25 fractions.  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 The treatment for this is radiation. The Radiation consult is pending but hopefully will be completed today  Her pain is not yet well controlled. Once it is I can continue further evaluation and treatment as outpatient.  Sugars may be a problem on steroids. No need for tight control-- 120 - 220 would be acceptable  She will see me 8/18 at 3 PM. She will probably be undergoing radiation by then  Will follow with you. Greatly appreciate your help to this patient!   Chauncey Cruel, MD 06/10/2016  8:34 AM Medical Oncology and Hematology [facility] [address] [place], [place] [address] Tel. [phone]    Fax. [phone]

## 2016-06-10 NOTE — Telephone Encounter (Signed)
Called the floor 1419 spoke with RN , patient  to have CT sim for 90 min stating at 10:00 am, please medicate patient for pain if needed, per Pam she did medicate the patient an hour ago   8:56 AM

## 2016-06-11 ENCOUNTER — Other Ambulatory Visit: Payer: Self-pay | Admitting: Oncology

## 2016-06-11 LAB — GLUCOSE, CAPILLARY
GLUCOSE-CAPILLARY: 138 mg/dL — AB (ref 65–99)
GLUCOSE-CAPILLARY: 213 mg/dL — AB (ref 65–99)
GLUCOSE-CAPILLARY: 148 mg/dL — AB (ref 65–99)
Glucose-Capillary: 243 mg/dL — ABNORMAL HIGH (ref 65–99)

## 2016-06-11 LAB — CHOLESTEROL, BODY FLUID: CHOL FL: 53 mg/dL

## 2016-06-11 LAB — ADENOSIDE DEAMINASE, PLEURAL FL

## 2016-06-11 MED ORDER — OXYCODONE HCL ER 20 MG PO T12A
40.0000 mg | EXTENDED_RELEASE_TABLET | Freq: Two times a day (BID) | ORAL | Status: DC
  Administered 2016-06-11 (×2): 40 mg via ORAL
  Filled 2016-06-11 (×2): qty 2

## 2016-06-11 NOTE — Progress Notes (Signed)
PROGRESS NOTE    ZION LINT  ZJI:[id number] DOB: September 14, 1966 DOA: 06/06/2016  PCP: [name], MD   Brief Narrative:  50 y/o with DM 2, breast cancer s/p treatment  With chemo, surgery and radiation who is being monitored by Dr [name] who has had a cough for a few weeks now and was treated as outpt with Levaquin but did not improve. Cough is severe and on the most part non-productive worse with taking deep breaths. Recent CT of the chest suggested lymphogenic spread of cancer with mets in the visualized spine.  Also has had back pain and as mentioned, has mets in her spine.   Subjective: Quite disturbed by the findings that she has metastasis to the brain. Continues to have pain. Cough better but has not resolved.   Assessment & Plan:   Principal Problem:   Acute respiratory failure with hypoxia  - likely due to metastatic cancer although underlying infection cannot be completely occluded- has not responded to Levaquin - Tussionex and Tessalon for cough - have contacted oncology and pulmonary - right thoracentesis done and sent for cytology- cytology shows adenocarcinoma - respiratory failure has improved and she is no longer requiring O2  Active Problems:   Breast cancer of upper-outer quadrant of left female breast with metastasis to spine and brain - as above, oncology consulting- fluid cytology is positive for malignant cells - Oncology to discuss treatment plan with patient and family - will have radiation to back and brain mets  Back pain with metastasis to spine   - Dilaudid and Percocet  >>  added Fentanyl patch 75 mcg to reduce use of PRN pain meds but she states that she cannot even feel it working- d/c patch, start Oxycontin 40 mg Q 12 hrs with Oxycodone in between.  - added steroids - Added voltaren gel for chest wall pain - to be assessed by rad onc  Dehydration - resolved  Anxiety In relation to diagnosis- Ativan  Diabetes mellitus without  complication  - sliding scale insulin- resumed Glipizide as steroids being started - have increased dose as sugars elevated   Protein-calorie malnutrition, moderate  - supplements ordered  DVT prophylaxis: Lovenox Code Status: Full Family Communication:  Disposition Plan: can be discharged once pain is controlled Consultants:   Oncology  Pulmonary  Procedures:  None  Antimicrobials:  Anti-infectives    None       Objective: Vitals:   06/10/16 0421 06/10/16 1429 06/10/16 2008 06/11/16 0431  BP: (!) 155/74 (!) 154/88 131/80 (!) 144/82  Pulse: 82 87 86 69  Resp: '18 20 18 20  '$ Temp: 98.4 F (36.9 C) 98.2 F (36.8 C) 98.8 F (37.1 C) 99.4 F (37.4 C)  TempSrc: Oral Oral Oral Oral  SpO2: 95% 95% 96% 98%  Weight:      Height:        Intake/Output Summary (Last 24 hours) at 06/11/16 1245 Last data filed at 06/11/16 0945  Gross per 24 hour  Intake              480 ml  Output                0 ml  Net              480 ml   Filed Weights   06/06/16 2144 06/07/16 0431  Weight: 69.9 kg (154 lb) 72.1 kg (158 lb 15.2 oz)    Examination: General exam: Appears comfortable HEENT: PERRLA, oral mucosa moist, no  sclera icterus or thrush Respiratory system: decreased breath sounds-  Respiratory effort normal  Cardiovascular system: S1 & S2 heard, RRR.  No murmurs  Gastrointestinal system: Abdomen soft, non-tender, nondistended. Normal bowel sound. No organomegaly Central nervous system: Alert and oriented. No focal neurological deficits. Extremities: No cyanosis, clubbing or edema Skin: No rashes or ulcers Psychiatry:  Mood & affect appropriate.     Data Reviewed: I have personally reviewed following labs and imaging studies  CBC:  Recent Labs Lab 06/07/16 0100 06/07/16 0105  WBC 10.6*  --   NEUTROABS 8.1*  --   HGB 11.0* 11.9*  HCT 33.2* 35.0*  MCV 86.5  --   PLT 446*  --    Basic Metabolic Panel:  Recent Labs Lab 06/07/16 0105 06/07/16 1543  06/08/16 0549  NA 138 139 137  K 3.9 4.0 3.6  CL 98* 105 101  CO2  --  29 28  GLUCOSE 146* 125* 146*  BUN '12 10 8  '$ CREATININE 0.70 0.36* 0.40*  CALCIUM  --  8.3* 8.9  MG  --   --  1.8  PHOS  --   --  3.9   GFR: Estimated Creatinine Clearance: 93 mL/min (by C-G formula based on SCr of 0.8 mg/dL). Liver Function Tests:  Recent Labs Lab 06/08/16 1324  PROT 6.7   No results for input(s): LIPASE, AMYLASE in the last 168 hours. No results for input(s): AMMONIA in the last 168 hours. Coagulation Profile:  Recent Labs Lab 06/07/16 1732  INR 1.07   Cardiac Enzymes: No results for input(s): CKTOTAL, CKMB, CKMBINDEX, TROPONINI in the last 168 hours. BNP (last 3 results) No results for input(s): PROBNP in the last 8760 hours. HbA1C: No results for input(s): HGBA1C in the last 72 hours. CBG:  Recent Labs Lab 06/10/16 1125 06/10/16 1641 06/10/16 2111 06/11/16 0628 06/11/16 1205  GLUCAP 252* 218* 201* 138* 213*   Lipid Profile:  Recent Labs  06/08/16 1324  CHOL 177   Thyroid Function Tests: No results for input(s): TSH, T4TOTAL, FREET4, T3FREE, THYROIDAB in the last 72 hours. Anemia Panel: No results for input(s): VITAMINB12, FOLATE, FERRITIN, TIBC, IRON, RETICCTPCT in the last 72 hours. Urine analysis:    Component Value Date/Time   COLORURINE YELLOW 06/03/2014 1050   APPEARANCEUR CLEAR 06/03/2014 1050   LABSPEC >1.030 (H) 06/03/2014 1050   PHURINE 6.0 06/03/2014 1050   GLUCOSEU NEGATIVE 06/03/2014 1050   HGBUR SMALL (A) 06/03/2014 1050   BILIRUBINUR SMALL (A) 06/03/2014 1050   KETONESUR NEGATIVE 06/03/2014 1050   PROTEINUR NEGATIVE 06/03/2014 1050   UROBILINOGEN 0.2 06/03/2014 1050   NITRITE NEGATIVE 06/03/2014 1050   LEUKOCYTESUR TRACE (A) 06/03/2014 1050   Sepsis Labs: '@LABRCNTIP'$ (procalcitonin:4,lacticidven:4) ) Recent Results (from the past 240 hour(s))  Body fluid culture     Status: None (Preliminary result)   Collection Time: 06/08/16  1:00 PM   Result Value Ref Range Status   Specimen Description PLEURAL FLUID  Final   Special Requests NONE  Final   Gram Stain   Final    FEW WBC PRESENT,BOTH PMN AND MONONUCLEAR NO ORGANISMS SEEN    Culture   Final    NO GROWTH 2 DAYS Performed at [facility]    Report Status PENDING  Incomplete         Radiology Studies: [name] Wo Contrast  Result Date: 06/09/2016 CLINICAL DATA:  Metastatic breast cancer. EXAM: MRI HEAD WITHOUT AND WITH CONTRAST TECHNIQUE: Multiplanar, multiecho pulse sequences of the brain and  surrounding structures were obtained without and with intravenous contrast. CONTRAST:  77m MULTIHANCE GADOBENATE DIMEGLUMINE 529 MG/ML IV SOLN COMPARISON:  06/02/2014 FINDINGS: No definite acute infarct is identified. There are a few punctate foci of scattered trace diffusion signal abnormality in both cerebral hemispheres, most notably in the posterior left frontal lobe and right temporal lobe without clearly restricted diffusion. No intracranial hemorrhage, midline shift, or extra-axial fluid collection is present. The ventricles and sulci are normal in size. There are 12-15 subcentimeter enhancing lesions in the cerebellar hemispheres measuring up to 5 mm in size. Two 1 mm enhancing lesions are questioned in the right parietal lobe (series 11, images 31 and 35, not clearly confirmed on coronal or sagittal sequences). There is a 2 mm enhancing lesion more superiorly and medially in the right parietal lobe (series 11, image 39). A 7 mm enhancing lesion is present in the left frontal lobe (series 11, image 40). There is a 2 mm enhancing lesion in the anterior inferior left temporal lobe (series 12, image 15 and series 13, image 19). A 1 mm lesion is questioned in the posterior left temporal lobe adjacent to the temporal horn (series 12, image 10). Mild motion artifact on postcontrast sequences could potentially obscure additional tiny lesions. There is no significant enhancement  associated with any of the lesions. Orbits are unremarkable. Paranasal sinuses and mastoid air cells are clear. Major intracranial vascular flow voids are preserved. No destructive osseous lesion is identified. IMPRESSION: Numerous (15-20) subcentimeter enhancing lesions consistent with metastases and with greatest involvement of the cerebellum. No edema. Motion artifact may obscure additional tiny lesions. Electronically Signed   By: ALogan BoresM.D.   On: 06/09/2016 20:54      Scheduled Meds: . benzonatate  200 mg Oral TID  . chlorpheniramine-HYDROcodone  5 mL Oral Q12H  . dexamethasone  4 mg Oral Q8H  . diclofenac sodium  2 g Topical QID  . enoxaparin (LOVENOX) injection  40 mg Subcutaneous Q24H  . feeding supplement (GLUCERNA SHAKE)  237 mL Oral TID BM  . glipiZIDE  10 mg Oral Q breakfast  . insulin aspart  0-5 Units Subcutaneous QHS  . insulin aspart  0-9 Units Subcutaneous TID WC  . letrozole  2.5 mg Oral Daily  . multivitamin with minerals  1 tablet Oral Daily  . oxyCODONE  40 mg Oral Q12H  . polyethylene glycol  17 g Oral BID  . sodium chloride flush  3 mL Intravenous Q12H   Continuous Infusions:    LOS: 3 days    Time spent in minutes: 358   Harlow Basley, MD [facility] Pager: [email or web address] Password [email or web address]8/09/2016, 12:45 PM

## 2016-06-11 NOTE — Progress Notes (Signed)
[name]   DOB:1966/05/20   TM#:[id number]   GYB#:[id number]  Subjective: .Laura Matthews is much calmer today; she understands her breast cancer is back; she is trying to eat and ambulate so she can go home sooner; pain meds changed today and we discussed that; no family in room  Objective: middle aged African American woman examined in bed Vitals:   06/10/16 2008 06/11/16 0431  BP: 131/80 (!) 144/82  Pulse: 86 69  Resp: 18 20  Temp: 98.8 F (37.1 C) 99.4 F (37.4 C)    Body mass index is 22.81 kg/m.  Intake/Output Summary (Last 24 hours) at 06/11/16 0856 Last data filed at 06/10/16 1300  Gross per 24 hour  Intake              240 ml  Output                0 ml  Net              240 ml     Lungs no rubs--no cough apparent this AM  Heart regular rate and rhythm  Abdomen soft, +BS  Neuro nonfocal  Breast exam:deferred  CBG (last 3)   Recent Labs  06/10/16 1641 06/10/16 2111 06/11/16 0628  GLUCAP 218* 201* 138*     Labs:  Lab Results  Component Value Date   WBC 10.6 (H) 06/07/2016   HGB 11.9 (L) 06/07/2016   HCT 35.0 (L) 06/07/2016   MCV 86.5 06/07/2016   PLT 446 (H) 06/07/2016   NEUTROABS 8.1 (H) 06/07/2016    '@LASTCHEMISTRY' @  Urine Studies No results for input(s): UHGB, CRYS in the last 72 hours.  Invalid input(s): UACOL, UAPR, USPG, UPH, UTP, UGL, UKET, UBIL, UNIT, UROB, ULEU, UEPI, UWBC, URBC, UBAC, CAST, [place], [place]  Basic Metabolic Panel:  Recent Labs Lab 06/07/16 0105 06/07/16 1543 06/08/16 0549  NA 138 139 137  K 3.9 4.0 3.6  CL 98* 105 101  CO2  --  29 28  GLUCOSE 146* 125* 146*  BUN '12 10 8  ' CREATININE 0.70 0.36* 0.40*  CALCIUM  --  8.3* 8.9  MG  --   --  1.8  PHOS  --   --  3.9   GFR Estimated Creatinine Clearance: 93 mL/min (by C-G formula based on SCr of 0.8 mg/dL). Liver Function Tests:  Recent Labs Lab 06/08/16 1324  PROT 6.7   No results for input(s): LIPASE, AMYLASE in the last 168 hours. No results for input(s): AMMONIA  in the last 168 hours. Coagulation profile  Recent Labs Lab 06/07/16 1732  INR 1.07    CBC:  Recent Labs Lab 06/07/16 0100 06/07/16 0105  WBC 10.6*  --   NEUTROABS 8.1*  --   HGB 11.0* 11.9*  HCT 33.2* 35.0*  MCV 86.5  --   PLT 446*  --    Cardiac Enzymes: No results for input(s): CKTOTAL, CKMB, CKMBINDEX, TROPONINI in the last 168 hours. BNP: Invalid input(s): POCBNP CBG:  Recent Labs Lab 06/10/16 0743 06/10/16 1125 06/10/16 1641 06/10/16 2111 06/11/16 0628  GLUCAP 266* 252* 218* 201* 138*   D-Dimer No results for input(s): DDIMER in the last 72 hours. Hgb A1c No results for input(s): HGBA1C in the last 72 hours. Lipid Profile  Recent Labs  06/08/16 1324  CHOL 177   Thyroid function studies No results for input(s): TSH, T4TOTAL, T3FREE, THYROIDAB in the last 72 hours.  Invalid input(s): FREET3 Anemia work up No results for input(s): VITAMINB12, FOLATE,  FERRITIN, TIBC, IRON, RETICCTPCT in the last 72 hours. Microbiology Recent Results (from the past 240 hour(s))  Body fluid culture     Status: None (Preliminary result)   Collection Time: 06/08/16  1:00 PM  Result Value Ref Range Status   Specimen Description PLEURAL FLUID  Final   Special Requests NONE  Final   Gram Stain   Final    FEW WBC PRESENT,BOTH PMN AND MONONUCLEAR NO ORGANISMS SEEN    Culture   Final    NO GROWTH 2 DAYS Performed at [facility]    Report Status PENDING  Incomplete      Studies:  Mr Laura Matthews Wo Contrast  Result Date: 06/09/2016 CLINICAL DATA:  Metastatic breast cancer. EXAM: MRI HEAD WITHOUT AND WITH CONTRAST TECHNIQUE: Multiplanar, multiecho pulse sequences of the brain and surrounding structures were obtained without and with intravenous contrast. CONTRAST:  54m MULTIHANCE GADOBENATE DIMEGLUMINE 529 MG/ML IV SOLN COMPARISON:  06/02/2014 FINDINGS: No definite acute infarct is identified. There are a few punctate foci of scattered trace diffusion signal  abnormality in both cerebral hemispheres, most notably in the posterior left frontal lobe and right temporal lobe without clearly restricted diffusion. No intracranial hemorrhage, midline shift, or extra-axial fluid collection is present. The ventricles and sulci are normal in size. There are 12-15 subcentimeter enhancing lesions in the cerebellar hemispheres measuring up to 5 mm in size. Two 1 mm enhancing lesions are questioned in the right parietal lobe (series 11, images 31 and 35, not clearly confirmed on coronal or sagittal sequences). There is a 2 mm enhancing lesion more superiorly and medially in the right parietal lobe (series 11, image 39). A 7 mm enhancing lesion is present in the left frontal lobe (series 11, image 40). There is a 2 mm enhancing lesion in the anterior inferior left temporal lobe (series 12, image 15 and series 13, image 19). A 1 mm lesion is questioned in the posterior left temporal lobe adjacent to the temporal horn (series 12, image 10). Mild motion artifact on postcontrast sequences could potentially obscure additional tiny lesions. There is no significant enhancement associated with any of the lesions. Orbits are unremarkable. Paranasal sinuses and mastoid air cells are clear. Major intracranial vascular flow voids are preserved. No destructive osseous lesion is identified. IMPRESSION: Numerous (15-20) subcentimeter enhancing lesions consistent with metastases and with greatest involvement of the cerebellum. No edema. Motion artifact may obscure additional tiny lesions. Electronically Signed   By: ALogan BoresM.D.   On: 06/09/2016 20:54   Dg Chest Port 1 View  Result Date: 06/09/2016 CLINICAL DATA:  Follow-up pleural effusion.  Breast cancer. EXAM: PORTABLE CHEST 1 VIEW COMPARISON:  Chest radiograph from one day prior. FINDINGS: Surgical clips throughout the left axilla and left lateral breast are again noted. Stable cardiomediastinal silhouette with normal heart size. No  pneumothorax. Stable trace bilateral pleural effusions. Hazy and linear opacities throughout both lungs are not appreciably changed. IMPRESSION: 1. No pneumothorax. 2. Stable trace bilateral pleural effusions. 3. Stable hazy and linear opacities throughout both lungs. Given the normal heart size and history of breast cancer, this finding raises concern for lymphangitic tumor as noted on recent chest CT studies. Electronically Signed   By: JIlona SorrelM.D.   On: 06/09/2016 12:31    Assessment: 50y.o. [place] woman status post left breast upper outer quadrant biopsy 05/09/2014 for a clinicalT1c N1, stage IIA invasive ductal carcinoma, grade 2, estrogen and progesterone receptor positive, HER-2 nonamplified, with an MIB-1  of 80%  (1) patient met with genetics counselor 05/29/2014 but decided against genetics testing   (2) tobacco abuse. The patient has been strongly urged to discontinue smoking. She was prescribed a nicotine patch 09/02/2014  (3) status post left lumpectomy and axillary lymph node dissection 06/11/2014 for a pT1c pN3, stage IIIC invasive ductal carcinoma, grade 3, with repeat HER-2 negative  (4) adjuvant chemotherapy consisting of doxorubicin and cyclophosphamide in dose dense fashion x4, completed 09/09/2014, followed by paclitaxel weekly x12 completed 03/09/2015  (5) adjuvant radiation 04/22/15-06/23/15 Left breast/ 45 Gy at 1.8 Gy per fraction x 25 fractions.  Left supraclavicular fossa and axilla/ 45 Gy at 1.8 Gy per fraction x 25 fractions Left breast boost/ 16 Gy at 2 Gy per fraction x 8 fractions  (6) tamoxifen started 07/01/2015--discontinued 06/09/2016 with evidence of progression  METASTATIC DISEASE: involving brain, lungs and bone documented August 2017 (7) CT angio 06/06/2016 c/w lymphangitic carcinomatosis and lytic bone lesions  (a) right thoracentesis 06/08/2016-- cytology confirms adenocarcinoma, estrogen receptor positive  (b) brain MRI 06/09/2016  shows multiple metastases  (8) starting letrozole 06/09/2016; palbociclib to follow 06/17/2016  (9) radiation to CNS scheduled 06/13/2016 through 06/24/2016  (10) poorly controlled pain: starting oxycontin 40 mg BID with OXYIR for breakthrough pain 06/11/2016     Plan:  Discussed w [name] that cytology confirms we are dealing with metastatic breast cancer, and it is still estrogen receptor positive. She is now on letrozole and I will add palbociclib next week.  We discussed advanced directives and she tells me if she becomes incapacitated she would like her daughter [name] to make decisions; I will ask SW to see if we can get the appropriate documents completed and notarized today  Encouraged her to use the OXYIR instead of IV dilaudid for breakthrouogh pain so she can be discharged sooner, perhaps tomorrow if pain is well controlled on current regimen. She seems to be doing well with the current bowel prophylaxis meds.  She has a follow-up appointment with me 06/17/2016 and I have encouraged her family to come so they can all discuss the prognosis and plan and can best participate in her care.  Please let me know if I can be of further help.   w if I can be of further Susa Loffler, MD 06/11/2016  8:56 AM Medical Oncology and Hematology [facility] [place], [place] [address] Tel. [phone]    Fax. [phone]

## 2016-06-11 NOTE — Plan of Care (Signed)
Problem: Pain Managment: Goal: General experience of comfort will improve Outcome: Not Progressing Pt has not met pain goal. Provider is aware and has made adjustments to medication

## 2016-06-11 NOTE — Plan of Care (Signed)
Problem: Activity: Goal: Risk for activity intolerance will decrease Outcome: Completed/Met Date Met: 06/11/16 Pt has been out of the bed with 1 stand by assist. Pt has a steady gait. Pt seems to tolerate activity well with minimal sob

## 2016-06-12 LAB — GLUCOSE, CAPILLARY
GLUCOSE-CAPILLARY: 231 mg/dL — AB (ref 65–99)
Glucose-Capillary: 175 mg/dL — ABNORMAL HIGH (ref 65–99)

## 2016-06-12 LAB — BODY FLUID CULTURE: Culture: NO GROWTH

## 2016-06-12 MED ORDER — HYDROCOD POLST-CPM POLST ER 10-8 MG/5ML PO SUER: 5.0000 mL | Freq: Two times a day (BID) | ORAL | 0 refills | Status: DC | PRN

## 2016-06-12 MED ORDER — LETROZOLE 2.5 MG PO TABS: 2.5000 mg | ORAL_TABLET | Freq: Every day | ORAL | 0 refills | Status: DC

## 2016-06-12 MED ORDER — GLIPIZIDE ER 5 MG PO TB24: 10.0000 mg | ORAL_TABLET | Freq: Every day | ORAL | 12 refills | Status: DC

## 2016-06-12 MED ORDER — OXYCODONE HCL 10 MG PO TABS: 10.0000 mg | ORAL_TABLET | ORAL | 0 refills | Status: DC | PRN

## 2016-06-12 MED ORDER — OXYCODONE HCL ER 20 MG PO T12A
60.0000 mg | EXTENDED_RELEASE_TABLET | Freq: Two times a day (BID) | ORAL | Status: DC
  Administered 2016-06-12: 60 mg via ORAL
  Filled 2016-06-12: qty 3

## 2016-06-12 MED ORDER — POLYETHYLENE GLYCOL 3350 17 G PO PACK: 17.0000 g | PACK | Freq: Two times a day (BID) | ORAL | 0 refills | Status: DC

## 2016-06-12 MED ORDER — GLUCERNA SHAKE PO LIQD: 237.0000 mL | Freq: Three times a day (TID) | ORAL | 0 refills | Status: DC

## 2016-06-12 MED ORDER — LORAZEPAM 0.5 MG PO TABS: 0.5000 mg | ORAL_TABLET | Freq: Three times a day (TID) | ORAL | 0 refills | Status: DC | PRN

## 2016-06-12 MED ORDER — OXYCODONE-ACETAMINOPHEN 5-325 MG PO TABS: 1.0000 | ORAL_TABLET | Freq: Four times a day (QID) | ORAL | 0 refills | Status: DC | PRN

## 2016-06-12 MED ORDER — DEXAMETHASONE 4 MG PO TABS: 4.0000 mg | ORAL_TABLET | Freq: Three times a day (TID) | ORAL | 0 refills | Status: DC

## 2016-06-12 MED ORDER — OXYCODONE HCL ER 60 MG PO T12A: 60.0000 mg | EXTENDED_RELEASE_TABLET | Freq: Two times a day (BID) | ORAL | 0 refills | Status: DC

## 2016-06-12 MED ORDER — BENZONATATE 200 MG PO CAPS: 200.0000 mg | ORAL_CAPSULE | Freq: Three times a day (TID) | ORAL | 0 refills | Status: DC

## 2016-06-12 MED ORDER — ONDANSETRON 4 MG PO TBDP: 4.0000 mg | ORAL_TABLET | Freq: Three times a day (TID) | ORAL | 0 refills | Status: DC | PRN

## 2016-06-12 NOTE — Progress Notes (Signed)
Pt was A&Ox4,  ambulatoryDischarge instruction was reviewed. Pt denied questions, concerns.

## 2016-06-12 NOTE — Discharge Instructions (Signed)
Excess narcotics and Ativan can cause drowsiness- do not take more than prescribed- do not drive  Please take all your medications with you for your next visit with your Primary MD. Please request your Primary MD to go over all hospital test results at the follow up. Please ask your Primary MD to get all Hospital records sent to his/her office.  If you experience worsening of your admission symptoms, develop shortness of breath, chest pain, suicidal or homicidal thoughts or a life threatening emergency, you must seek medical attention immediately by calling 911 or calling your MD.  Laura Matthews must read the complete instructions/literature along with all the possible adverse reactions/side effects for all the medicines you take including new medications that have been prescribed to you. Take new medicines after you have completely understood and accpet all the possible adverse reactions/side effects.   Do not drive when taking pain medications or sedatives.    Do not take more than prescribed Pain, Sleep and Anxiety Medications  If you have smoked or chewed Tobacco in the last 2 yrs please stop. Stop any regular alcohol and or recreational drug use.  Wear Seat belts while driving.

## 2016-06-12 NOTE — Discharge Summary (Addendum)
Physician Discharge Summary  Laura Matthews UKG:[id number] DOB: 18-Mar-1966 DOA: 06/06/2016  PCP: [name], MD  Admit date: 06/06/2016 Discharge date: 06/12/2016  Admitted From: home alone Disposition:  Home with daughter   Recommendations for Outpatient Follow-up:  1. Has been explained in detail the excess narcotics and Ativan can cause drowsiness- she knows not to take excess of what is prescribed and not to drive   Discharge Condition:  stable   CODE STATUS:  Full code   Diet recommendation:  Diabetic diet Consultations:  Medical oncology, radiation oncology, pulmonary    Discharge Diagnoses:  Principal Problem:   Acute respiratory failure with hypoxia ([place]) Active Problems:   Breast cancer of upper-outer quadrant of left female breast ([place])   Breast cancer metastasized to axillary lymph node ([place])   Diabetes mellitus without complication (HCC)   Chest pain   Back pain   Protein-calorie malnutrition, moderate (HCC)   Hypoxia   Malignant neoplasm of lung (HCC)   Shortness of breath   S/P thoracentesis   Protein-calorie malnutrition, severe   Pleural effusion   Brain metastasis (HCC)   Brief Summary: 50 y/o with DM 2, breast cancer s/p treatment  With chemo, surgery and radiation who is being monitored by Dr [name] who has had a cough for a few weeks now and was treated as outpt with Levaquin but did not improve. Cough is severe and on the most part non-productive worse with taking deep breaths. Recent CT of the chest suggested lymphogenic spread of cancer with mets in the visualized spine.  Also has had back pain and as mentioned, has mets in her spine.   Hospital Course:  Principal Problem:   Acute respiratory failure with hypoxia and severe cough- malignant pleural effusions- lymphogenic spread of breast cancer - cough and dyspnea has not responded to Levaquin as outpt - Tussionex and Tessalon for cough - right thoracentesis done and sent for cytology which  shows adenocarcinoma - respiratory failure has improved and she is no longer requiring O2 - cough controlled with anti-tussives  Active Problems:   Breast cancer of upper-outer quadrant of left female breast with metastasis to spine, lungs and brain - as above, oncology consulting- fluid cytology is positive for malignant cells - Oncology to discuss treatment plan with patient and family as outpt - radiation oncology consulted- will have radiation to back and brain mets  Back pain with metastasis to spine   - Dilaudid and Percocet  >>  added Fentanyl patch 75 mcg to reduce use of PRN pain meds but she states that she cannot even feel it working- d/c patch, start Oxycontin 40 mg Q 12 hrs with Oxycodone in between - increased Oxycontin to 60 mg BID - added steroids -- pain much better controlled now - Added voltaren gel for chest wall pain from cough  Dehydration - resolved  Anxiety In relation to diagnosis- Ativan prescribed  Diabetes mellitus without complication  - sliding scale insulin- resumed Glipizide as steroids being started - have increased dose as sugars elevated   Protein-calorie malnutrition, moderate  - supplements ordered   Discharge Instructions  Discharge Instructions    Discharge instructions    Complete by:  As directed   Regular diet   Increase activity slowly    Complete by:  As directed       Medication List    STOP taking these medications   brompheniramine-pseudoephedrine-DM 30-2-10 MG/5ML syrup   oxyCODONE-acetaminophen 10-325 MG tablet Commonly known as:  PERCOCET Replaced  by:  oxyCODONE-acetaminophen 5-325 MG tablet   tamoxifen 20 MG tablet Commonly known as:  NOLVADEX     TAKE these medications   albuterol 108 (90 Base) MCG/ACT inhaler Commonly known as:  PROVENTIL HFA;VENTOLIN HFA Inhale 1-2 puffs into the lungs every 6 (six) hours as needed for wheezing or shortness of breath.   benzonatate 200 MG capsule Commonly known as:   TESSALON Take 1 capsule (200 mg total) by mouth 3 (three) times daily.   chlorpheniramine-HYDROcodone 10-8 MG/5ML Suer Commonly known as:  TUSSIONEX Take 5 mLs by mouth every 12 (twelve) hours as needed for cough.   dexamethasone 4 MG tablet Commonly known as:  DECADRON Take 1 tablet (4 mg total) by mouth every 8 (eight) hours.   feeding supplement (GLUCERNA SHAKE) Liqd Take 237 mLs by mouth 3 (three) times daily between meals.   glipiZIDE 5 MG 24 hr tablet Commonly known as:  GLUCOTROL XL Take 2 tablets (10 mg total) by mouth daily with breakfast. What changed:  See the new instructions.   letrozole 2.5 MG tablet Commonly known as:  FEMARA Take 1 tablet (2.5 mg total) by mouth daily.   LORazepam 0.5 MG tablet Commonly known as:  ATIVAN Take 1 tablet (0.5 mg total) by mouth every 8 (eight) hours as needed for anxiety.   ondansetron 4 MG disintegrating tablet Commonly known as:  ZOFRAN ODT Take 1 tablet (4 mg total) by mouth every 8 (eight) hours as needed for nausea or vomiting.   oxyCODONE 60 MG 12 hr tablet Commonly known as:  OXYCONTIN Take 60 mg by mouth every 12 (twelve) hours.   Oxycodone HCl 10 MG Tabs Take 1 tablet (10 mg total) by mouth every 4 (four) hours as needed for moderate pain.   oxyCODONE-acetaminophen 5-325 MG tablet Commonly known as:  PERCOCET/ROXICET Take 1-2 tablets by mouth every 6 (six) hours as needed for moderate pain. Replaces:  oxyCODONE-acetaminophen 10-325 MG tablet   polyethylene glycol packet Commonly known as:  MIRALAX / GLYCOLAX Take 17 g by mouth 2 (two) times daily.       Allergies  Allergen Reactions  . Hydrocodone Itching and Nausea Only     Procedures/Studies:  Thoracentesis - right sided  Dg Chest 2 View  Result Date: 06/06/2016 CLINICAL DATA:  Acute onset of shortness of breath. Initial encounter. EXAM: CHEST  2 VIEW COMPARISON:  Chest radiograph and CTA of the chest performed 05/19/2016 FINDINGS: The lungs are  well-aerated. Vascular congestion is noted. Mild bibasilar opacities may reflect minimal interstitial edema. There is no evidence of pleural effusion or pneumothorax. The heart is mildly enlarged. No acute osseous abnormalities are seen. Scattered clips are seen at the left axilla. IMPRESSION: Vascular congestion and mild cardiomegaly. Mild bibasilar opacities may reflect minimal interstitial edema. Electronically Signed   By: Garald Balding M.D.   On: 06/06/2016 23:13   Dg Ribs Unilateral W/chest Right  Result Date: 05/19/2016 CLINICAL DATA:  Cough 3 weeks with anterior right rib pain extending to back. History of left breast cancer. EXAM: RIGHT RIBS AND CHEST - 3+ VIEW COMPARISON:  09/17/2014 FINDINGS: Lungs are adequately inflated with mild prominence of the perihilar markings. No focal lobar consolidation or effusion. No pneumothorax. Cardiomediastinal silhouette is within normal. Multiple surgical clips over the left lateral chest and axilla. No definite right rib fracture. IMPRESSION: Suggestion of mild vascular congestion. No right rib fracture. Electronically Signed   By: Marin Olp M.D.   On: 05/19/2016 15:59   Ct Angio  Chest Pe W And/or Wo Contrast  Result Date: 06/07/2016 CLINICAL DATA:  Subacute onset of nonproductive cough, fever and nausea. 21 pounds of weight loss. Generalized back and chest pain. Shortness of breath with exertion. Mild leukocytosis. Initial encounter. EXAM: CT ANGIOGRAPHY CHEST WITH CONTRAST TECHNIQUE: Multidetector CT imaging of the chest was performed using the standard protocol during bolus administration of intravenous contrast. Multiplanar CT image reconstructions and MIPs were obtained to evaluate the vascular anatomy. CONTRAST:  100 mL of Isovue 300 IV contrast COMPARISON:  CTA of the chest performed 05/19/2016, and chest radiograph performed 06/06/2016 FINDINGS: There is no evidence of pulmonary embolus. Small bilateral pleural effusions are noted. Note is made of  vague soft tissue density tracking about the right hilum, measuring approximately 3.7 x 3.7 x 2.0 cm, with mild mass effect on the right-sided pulmonary arteries and right-sided bronchioles. This raises concern for malignancy. Interstitial prominence is noted, with bibasilar airspace opacities, raising concern for pulmonary edema, though underlying pneumonia cannot be excluded. Underlying fissural nodularity on the right side could reflect lymphangitic carcinomatosis, as previously noted. Peripheral scarring is noted. No pneumothorax is identified. Trace pericardial fluid is noted. This may be within normal limits, though malignant pericardial effusion cannot be excluded. No definite mediastinal lymphadenopathy is seen. Vague soft tissue density tracks about the subcarinal region and azygoesophageal recess. The great vessels are grossly unremarkable in appearance. No axillary lymphadenopathy is seen. The visualized portions of the thyroid gland are unremarkable in appearance. There is diffuse skin thickening and irregularity along the left breast. Would correlate for any evidence of inflammatory breast cancer. Postoperative change and a small postoperative fluid collection measuring 2.8 cm are noted underlying the left nipple, with retraction of the left nipple. The visualized portions of the liver and spleen are unremarkable. Numerous lytic lesions are noted throughout the visualized thoracic and upper lumbar spine, concerning for metastatic disease. A small sclerotic focus is also noted at T5. Review of the MIP images confirms the above findings. IMPRESSION: 1. No evidence of pulmonary embolus. 2. Small bilateral pleural effusions noted. 3. Vague soft tissue density tracking about the right hilum, measuring approximately 3.7 x 3.7 x 2.0 cm, with mild mass effect on the right-sided pulmonary arteries and right-sided bronchioles. This raises concern for malignancy. Underlying right-sided pleural nodularity could  reflect lymphangitic carcinomatosis, as previously noted. 4. Interstitial prominence, with bibasilar airspace opacities, concerning for pulmonary edema, though underlying pneumonia cannot be excluded. 5. Trace pericardial fluid may remain within normal limits, though malignant pericardial effusion cannot be excluded. Vague soft tissue density tracks about the subcarinal region and azygoesophageal recess, concerning for extension of malignancy. 6. Diffuse skin thickening and irregularity along the left breast. Would correlate for any evidence of inflammatory breast cancer. Underlying postoperative change and small postoperative fluid collection measuring 2.8 cm, with retraction of the left nipple. 7. Numerous lytic lesions throughout the visualized thoracic and upper lumbar spine, concerning for metastatic disease. Small sclerotic focus also noted at T5. Electronically Signed   By: Garald Balding M.D.   On: 06/07/2016 04:34   Ct Angio Chest Pe W/cm &/or Wo Cm  Result Date: 05/20/2016 CLINICAL DATA:  Right chest pain and elevated D-dimer. EXAM: CT ANGIOGRAPHY CHEST WITH CONTRAST TECHNIQUE: Multidetector CT imaging of the chest was performed using the standard protocol during bolus administration of intravenous contrast. Multiplanar CT image reconstructions and MIPs were obtained to evaluate the vascular anatomy. CONTRAST:  80 cc Isovue 370 intravenous COMPARISON:  05/29/2014 head CT FINDINGS: Cardiovascular:  Limited pulmonary artery CTA, especially beyond the segmental level. This appears secondary to transient interruption of contrast. No evidence of pulmonary embolism. Negative thoracic aorta. Normal heart size. No pericardial effusion. Mediastinum: Increased soft tissue in the right hilum without discrete measurable nodule. Lungs/Pleura: Diffuse Kerley lines consistent with mild edema. Small pleural effusion on the right without convincing nodularity. Subpleural reticulation left upper lobe is likely radiation  fibrosis. There is asymmetric airway thickening on the right with thickened major and minor fissures around the right middle lobe that has nodular morphology and neighboring airspace opacity. Air trapping bilaterally. Upper abdomen: Hepatic steatosis. Mildly lobulated adrenal glands without discrete mass. Musculoskeletal: Lumpectomy site in the left breast with central fluid like density. Area measures 23 mm. Left breast is thick walled, potentially radiation related. Soft tissue nodularity, nonspecific, around left axillary clips. Patient is followed with mammography, most recently 01/06/2016. Review of the MIP images confirms the above findings. IMPRESSION: 1. Limited CTA of the pulmonary arteries. Negative for pulmonary embolism to the segmental level. 2. Mild pulmonary edema with small right pleural effusion. 3. Right-sided fissural nodularity and asymmetric hilar soft tissue is primarily concerning for lymphangitic carcinomatosis. Atypical infection is also considered in the acute setting. 4. Left axillary dissection with unexpected soft tissue around the surgical clips. 5. Suggest PET-CT follow-up after convalescence to evaluate #3 and #4. Electronically Signed   By: Monte Fantasia M.D.   On: 05/20/2016 00:03   [name] TI Contrast  Result Date: 06/09/2016 CLINICAL DATA:  Metastatic breast cancer. EXAM: MRI HEAD WITHOUT AND WITH CONTRAST TECHNIQUE: Multiplanar, multiecho pulse sequences of the brain and surrounding structures were obtained without and with intravenous contrast. CONTRAST:  4m MULTIHANCE GADOBENATE DIMEGLUMINE 529 MG/ML IV SOLN COMPARISON:  06/02/2014 FINDINGS: No definite acute infarct is identified. There are a few punctate foci of scattered trace diffusion signal abnormality in both cerebral hemispheres, most notably in the posterior left frontal lobe and right temporal lobe without clearly restricted diffusion. No intracranial hemorrhage, midline shift, or extra-axial fluid  collection is present. The ventricles and sulci are normal in size. There are 12-15 subcentimeter enhancing lesions in the cerebellar hemispheres measuring up to 5 mm in size. Two 1 mm enhancing lesions are questioned in the right parietal lobe (series 11, images 31 and 35, not clearly confirmed on coronal or sagittal sequences). There is a 2 mm enhancing lesion more superiorly and medially in the right parietal lobe (series 11, image 39). A 7 mm enhancing lesion is present in the left frontal lobe (series 11, image 40). There is a 2 mm enhancing lesion in the anterior inferior left temporal lobe (series 12, image 15 and series 13, image 19). A 1 mm lesion is questioned in the posterior left temporal lobe adjacent to the temporal horn (series 12, image 10). Mild motion artifact on postcontrast sequences could potentially obscure additional tiny lesions. There is no significant enhancement associated with any of the lesions. Orbits are unremarkable. Paranasal sinuses and mastoid air cells are clear. Major intracranial vascular flow voids are preserved. No destructive osseous lesion is identified. IMPRESSION: Numerous (15-20) subcentimeter enhancing lesions consistent with metastases and with greatest involvement of the cerebellum. No edema. Motion artifact may obscure additional tiny lesions. Electronically Signed   By: ALogan BoresM.D.   On: 06/09/2016 20:54   Dg Chest Port 1 View  Result Date: 06/09/2016 CLINICAL DATA:  Follow-up pleural effusion.  Breast cancer. EXAM: PORTABLE CHEST 1 VIEW COMPARISON:  Chest radiograph from one  day prior. FINDINGS: Surgical clips throughout the left axilla and left lateral breast are again noted. Stable cardiomediastinal silhouette with normal heart size. No pneumothorax. Stable trace bilateral pleural effusions. Hazy and linear opacities throughout both lungs are not appreciably changed. IMPRESSION: 1. No pneumothorax. 2. Stable trace bilateral pleural effusions. 3. Stable  hazy and linear opacities throughout both lungs. Given the normal heart size and history of breast cancer, this finding raises concern for lymphangitic tumor as noted on recent chest CT studies. Electronically Signed   By: Ilona Sorrel M.D.   On: 06/09/2016 12:31   Dg Chest Port 1 View  Result Date: 06/08/2016 CLINICAL DATA:  RIGHT pleural effusion post thoracentesis EXAM: PORTABLE CHEST 1 VIEW COMPARISON:  Portable exam 1310 hours compared earlier study of 01/1939 2 hours FINDINGS: Minimal enlargement of cardiac silhouette. Slight pulmonary vascular congestion with persistent pulmonary edema unchanged. Improved aeration RIGHT base. No pneumothorax following thoracentesis. Surgical clips RIGHT axilla. No acute osseous findings. IMPRESSION: No pneumothorax following RIGHT thoracentesis. Persistent pulmonary edema. Electronically Signed   By: Lavonia Dana M.D.   On: 06/08/2016 13:33   Dg Chest Port 1 View  Result Date: 06/08/2016 CLINICAL DATA:  Breast cancer. EXAM: PORTABLE CHEST 1 VIEW COMPARISON:  CT 06/07/2016 .  Chest x-ray 06/06/2016. FINDINGS: Mediastinum stable. Stable cardiomegaly. Persistent diffuse bilateral pulmonary infiltrates and/or edema. No pleural effusion or pneumothorax. Surgical clips left chest wall. IMPRESSION: 1. Persistent bilateral pulmonary infiltrates and or edema. No significant change. 2.  Stable cardiomegaly. Electronically Signed   By: Marcello Moores  Register   On: 06/08/2016 06:40       Discharge Exam: Vitals:   06/11/16 2139 06/12/16 0502  BP: (!) 144/91 (!) 148/73  Pulse: 66 74  Resp: 18 20  Temp: 98 F (36.7 C) 97.9 F (36.6 C)   Vitals:   06/11/16 0431 06/11/16 1415 06/11/16 2139 06/12/16 0502  BP: (!) 144/82 (!) 141/77 (!) 144/91 (!) 148/73  Pulse: 69 80 66 74  Resp: '20 16 18 20  '$ Temp: 99.4 F (37.4 C) 98 F (36.7 C) 98 F (36.7 C) 97.9 F (36.6 C)  TempSrc: Oral Oral Oral Oral  SpO2: 98% 97% 95% 96%  Weight:      Height:        General: Pt is alert,  awake, not in acute distress Cardiovascular: RRR, S1/S2 +, no rubs, no gallops Respiratory: CTA bilaterally, no wheezing, no rhonchi Abdominal: Soft, NT, ND, bowel sounds + Extremities: no edema, no cyanosis    The results of significant diagnostics from this hospitalization (including imaging, microbiology, ancillary and laboratory) are listed below for reference.     Microbiology: Recent Results (from the past 240 hour(s))  Body fluid culture     Status: None   Collection Time: 06/08/16  1:00 PM  Result Value Ref Range Status   Specimen Description PLEURAL FLUID  Final   Special Requests NONE  Final   Gram Stain   Final    FEW WBC PRESENT,BOTH PMN AND MONONUCLEAR NO ORGANISMS SEEN    Culture   Final    NO GROWTH 3 DAYS Performed at [facility]    Report Status 06/12/2016 FINAL  Final     Labs: BNP (last 3 results)  Recent Labs  05/19/16 2134  BNP 93.2   Basic Metabolic Panel:  Recent Labs Lab 06/07/16 0105 06/07/16 1543 06/08/16 0549  NA 138 139 137  K 3.9 4.0 3.6  CL 98* 105 101  CO2  --  29 28  GLUCOSE 146* 125* 146*  BUN '12 10 8  '$ CREATININE 0.70 0.36* 0.40*  CALCIUM  --  8.3* 8.9  MG  --   --  1.8  PHOS  --   --  3.9   Liver Function Tests:  Recent Labs Lab 06/08/16 1324  PROT 6.7   No results for input(s): LIPASE, AMYLASE in the last 168 hours. No results for input(s): AMMONIA in the last 168 hours. CBC:  Recent Labs Lab 06/07/16 0100 06/07/16 0105  WBC 10.6*  --   NEUTROABS 8.1*  --   HGB 11.0* 11.9*  HCT 33.2* 35.0*  MCV 86.5  --   PLT 446*  --    Cardiac Enzymes: No results for input(s): CKTOTAL, CKMB, CKMBINDEX, TROPONINI in the last 168 hours. BNP: Invalid input(s): POCBNP CBG:  Recent Labs Lab 06/11/16 1205 06/11/16 1722 06/11/16 2122 06/12/16 0746 06/12/16 1210  GLUCAP 213* 148* 243* 175* 231*   D-Dimer No results for input(s): DDIMER in the last 72 hours. Hgb A1c No results for input(s): HGBA1C in the  last 72 hours. Lipid Profile No results for input(s): CHOL, HDL, LDLCALC, TRIG, CHOLHDL, LDLDIRECT in the last 72 hours. Thyroid function studies No results for input(s): TSH, T4TOTAL, T3FREE, THYROIDAB in the last 72 hours.  Invalid input(s): FREET3 Anemia work up No results for input(s): VITAMINB12, FOLATE, FERRITIN, TIBC, IRON, RETICCTPCT in the last 72 hours. Urinalysis    Component Value Date/Time   COLORURINE YELLOW 06/03/2014 1050   APPEARANCEUR CLEAR 06/03/2014 1050   LABSPEC >1.030 (H) 06/03/2014 1050   PHURINE 6.0 06/03/2014 1050   GLUCOSEU NEGATIVE 06/03/2014 1050   HGBUR SMALL (A) 06/03/2014 1050   BILIRUBINUR SMALL (A) 06/03/2014 1050   KETONESUR NEGATIVE 06/03/2014 1050   PROTEINUR NEGATIVE 06/03/2014 1050   UROBILINOGEN 0.2 06/03/2014 1050   NITRITE NEGATIVE 06/03/2014 1050   LEUKOCYTESUR TRACE (A) 06/03/2014 1050   Sepsis Labs Invalid input(s): PROCALCITONIN,  WBC,  LACTICIDVEN Microbiology Recent Results (from the past 240 hour(s))  Body fluid culture     Status: None   Collection Time: 06/08/16  1:00 PM  Result Value Ref Range Status   Specimen Description PLEURAL FLUID  Final   Special Requests NONE  Final   Gram Stain   Final    FEW WBC PRESENT,BOTH PMN AND MONONUCLEAR NO ORGANISMS SEEN    Culture   Final    NO GROWTH 3 DAYS Performed at [facility]    Report Status 06/12/2016 FINAL  Final     Time coordinating discharge: Over 30 minutes  SIGNED:   Debbe Odea, MD  [facility] 06/12/2016, 2:04 PM Pager   If 7PM-7AM, please contact night-coverage [email or web address] Password [email or web address]

## 2016-06-12 NOTE — Care Management Note (Signed)
Case Management Note  Patient Details  Name: Laura Matthews MRN: [id number] Date of Birth: 1965-12-16  Subjective/Objective:     Breast cancer               Action/Plan: Discharge Planning: AVS reviewed:  NCM spoke to pt and [facility] RN was not order or needed at this time. Pt states her dtr will be able to assist her at home as needed. Has Medicaid. She can afford her medications at home.   PCP- [name] MD   Expected Discharge Date:  06/12/2016            Expected Discharge Plan:  Home/Self Care  In-House Referral:  NA  Discharge planning Services  CM Consult  Post Acute Care Choice:  NA Choice offered to:  Patient  DME Arranged:  N/A DME Agency:  NA  HH Arranged:  NA HH Agency:  NA  Status of Service:  Completed, signed off  If discussed at [place] of Stay Meetings, dates discussed:    Additional Comments:  Erenest Rasher, RN 06/12/2016, 12:42 PM

## 2016-06-13 ENCOUNTER — Ambulatory Visit
Admission: RE | Admit: 2016-06-13 | Discharge: 2016-06-13 | Disposition: A | Discharge status: Home / Self Care | Payer: Medicaid Other | Source: Ambulatory Visit | Attending: Radiation Oncology | Admitting: Radiation Oncology

## 2016-06-13 DIAGNOSIS — C50412 Malignant neoplasm of upper-outer quadrant of left female breast: Secondary | ICD-10-CM | POA: Diagnosis not present

## 2016-06-13 DIAGNOSIS — Z170 Estrogen receptor positive status [ER+]: Secondary | ICD-10-CM | POA: Diagnosis not present

## 2016-06-13 DIAGNOSIS — Z7984 Long term (current) use of oral hypoglycemic drugs: Secondary | ICD-10-CM | POA: Diagnosis not present

## 2016-06-13 DIAGNOSIS — Z79899 Other long term (current) drug therapy: Secondary | ICD-10-CM | POA: Diagnosis not present

## 2016-06-13 DIAGNOSIS — Z79811 Long term (current) use of aromatase inhibitors: Secondary | ICD-10-CM | POA: Diagnosis not present

## 2016-06-13 DIAGNOSIS — Z885 Allergy status to narcotic agent status: Secondary | ICD-10-CM | POA: Diagnosis not present

## 2016-06-13 DIAGNOSIS — Z510 Encounter for antineoplastic radiation therapy: Secondary | ICD-10-CM | POA: Diagnosis present

## 2016-06-14 ENCOUNTER — Ambulatory Visit: Admission: RE | Admit: 2016-06-14 | Payer: No Typology Code available for payment source | Source: Ambulatory Visit

## 2016-06-14 ENCOUNTER — Other Ambulatory Visit: Payer: Self-pay | Admitting: Oncology

## 2016-06-14 ENCOUNTER — Telehealth: Payer: Self-pay | Admitting: *Deleted

## 2016-06-14 ENCOUNTER — Ambulatory Visit: Payer: Medicaid Other

## 2016-06-14 NOTE — Telephone Encounter (Signed)
Called patient home [phone], spoke with Laura Matthews, she apologized but her daughter's husbands car broke down is cancelling today will be here tomorrow, please send to nursing after wards for patient education, Thayer Headings

## 2016-06-15 ENCOUNTER — Telehealth: Payer: Self-pay | Admitting: Oncology

## 2016-06-15 ENCOUNTER — Ambulatory Visit
Admission: RE | Admit: 2016-06-15 | Discharge: 2016-06-15 | Disposition: A | Discharge status: Home / Self Care | Payer: Medicaid Other | Source: Ambulatory Visit | Attending: Radiation Oncology | Admitting: Radiation Oncology

## 2016-06-15 DIAGNOSIS — C7931 Secondary malignant neoplasm of brain: Secondary | ICD-10-CM

## 2016-06-15 DIAGNOSIS — Z510 Encounter for antineoplastic radiation therapy: Secondary | ICD-10-CM | POA: Diagnosis not present

## 2016-06-15 MED ORDER — SONAFINE EX EMUL
1.0000 "application " | Freq: Two times a day (BID) | CUTANEOUS | Status: DC
  Administered 2016-06-15: 1 via TOPICAL

## 2016-06-15 NOTE — Telephone Encounter (Signed)
lvm to inform pt of 8/18 appt at 1130 per GM

## 2016-06-15 NOTE — Progress Notes (Signed)
Pt education done, sonafine cream, my business card, radiation therapy and you book given to the patient, skin irritation, pain, fatigue, bladder changes, nausea, diarrhea, low fiber diet and imodium prn for diarrhea, discussed with the patient, may need to eat smaller meals, 10 txs total t-sppine and brain, use sonafine to brain and spine daily,  Drink plenty fluids, eat more protein in diet,verbal understanding, teach nback 1:09 PM

## 2016-06-16 ENCOUNTER — Ambulatory Visit
Admission: RE | Admit: 2016-06-16 | Discharge: 2016-06-16 | Disposition: A | Discharge status: Home / Self Care | Payer: Medicaid Other | Source: Ambulatory Visit | Attending: Radiation Oncology | Admitting: Radiation Oncology

## 2016-06-16 DIAGNOSIS — Z510 Encounter for antineoplastic radiation therapy: Secondary | ICD-10-CM | POA: Diagnosis not present

## 2016-06-17 ENCOUNTER — Telehealth: Payer: Self-pay | Admitting: Pharmacist

## 2016-06-17 ENCOUNTER — Ambulatory Visit
Admission: RE | Admit: 2016-06-17 | Discharge: 2016-06-17 | Disposition: A | Discharge status: Home / Self Care | Payer: Medicaid Other | Source: Ambulatory Visit | Attending: Radiation Oncology | Admitting: Radiation Oncology

## 2016-06-17 ENCOUNTER — Telehealth: Payer: Self-pay | Admitting: Oncology

## 2016-06-17 ENCOUNTER — Ambulatory Visit (HOSPITAL_BASED_OUTPATIENT_CLINIC_OR_DEPARTMENT_OTHER): Payer: Medicaid Other | Admitting: Oncology

## 2016-06-17 ENCOUNTER — Encounter: Payer: Self-pay | Admitting: Radiation Oncology

## 2016-06-17 VITALS — BP 139/73 | HR 96 | Temp 98.4°F | Resp 20

## 2016-06-17 VITALS — BP 114/63 | HR 82 | Temp 98.4°F | Resp 18 | Ht 70.0 in | Wt 156.4 lb

## 2016-06-17 DIAGNOSIS — C7800 Secondary malignant neoplasm of unspecified lung: Secondary | ICD-10-CM

## 2016-06-17 DIAGNOSIS — C7931 Secondary malignant neoplasm of brain: Secondary | ICD-10-CM | POA: Diagnosis not present

## 2016-06-17 DIAGNOSIS — C3490 Malignant neoplasm of unspecified part of unspecified bronchus or lung: Secondary | ICD-10-CM

## 2016-06-17 DIAGNOSIS — C50412 Malignant neoplasm of upper-outer quadrant of left female breast: Secondary | ICD-10-CM | POA: Diagnosis not present

## 2016-06-17 DIAGNOSIS — C7951 Secondary malignant neoplasm of bone: Secondary | ICD-10-CM

## 2016-06-17 DIAGNOSIS — R52 Pain, unspecified: Secondary | ICD-10-CM

## 2016-06-17 DIAGNOSIS — Z510 Encounter for antineoplastic radiation therapy: Secondary | ICD-10-CM | POA: Diagnosis not present

## 2016-06-17 MED ORDER — PALBOCICLIB 125 MG PO CAPS: 125.0000 mg | ORAL_CAPSULE | Freq: Every day | ORAL | 6 refills | Status: DC

## 2016-06-17 MED ORDER — NAPROXEN SODIUM 220 MG PO TABS: 440.0000 mg | ORAL_TABLET | Freq: Three times a day (TID) | ORAL | 3 refills | Status: DC

## 2016-06-17 MED ORDER — MIRTAZAPINE 30 MG PO TABS: 30.0000 mg | ORAL_TABLET | Freq: Every day | ORAL | 3 refills | Status: DC

## 2016-06-17 MED ORDER — OXYCODONE HCL 10 MG PO TABS: 10.0000 mg | ORAL_TABLET | ORAL | 0 refills | Status: DC | PRN

## 2016-06-17 MED ORDER — OXYCODONE HCL ER 60 MG PO T12A: 180.0000 mg | EXTENDED_RELEASE_TABLET | Freq: Two times a day (BID) | ORAL | 0 refills | Status: DC

## 2016-06-17 NOTE — Progress Notes (Signed)
[facility]  [phone] [phone]     ID: Laura Matthews DOB: 08-04-1966  MR#: [id number]  GBT#:[id number]  Patient Care Team: [name], MD as PCP - General (Family Medicine) Stark Klein, MD as Consulting Physician (General Surgery) Chauncey Cruel, MD as Consulting Physician (Oncology) Thea Silversmith, MD as Consulting Physician (Radiation Oncology) [facility] Bunnie Pion, NP as Nurse Practitioner (Nurse Practitioner)  CHIEF COMPLAINT: Stage III left breast cancer  CURRENT TREATMENT: completing palliative brain irradiation; letrozole, palbociclib  BREAST CANCER HISTORY: From the original intake note:  The patient herself palpated a mass in her left breast and brought it to the attention of [facility] NP 04/22/2014. She palpated a large nontender mass at the 4:00 position as well as a small tender mass in the 6:00 position of the left breast. The patient was set up for imaging at the breast Center where on 05/05/2014 she underwent bilateral diagnostic mammography and left breast ultrasonography. The mammogram showed an irregular mass in the outer left breast with no other suspicious findings. This was firm and fixed at the 3:00 position approximately 10 cm from the nipple. Ultrasound showed a hypoechoic irregular solid mass in this area, measuring 1.8 cm. Ultrasound of the left axilla showed 2 suspicious level I lymph nodes, the largest measuring 1 cm, with a thickened pole. A smaller lymph node measured 0.6 cm but had no visible fatty hilum.  On 05/09/2014 the patient underwent biopsy of the left breast mass (she refused biopsy of the left axilla) which showed ([id number]) and invasive ductal carcinoma, grade 2, estrogen receptor positive at 100%, with strong staining intensity, progesterone receptor 80% positive, with weak staining intensity, with an MIB-1 of 80%, and no HER-2 amplification.  On 05/16/2014 the patient underwent bilateral breast MRI.  This showed a 1.7 cm irregular enhancing mass at the 3:00 position of the left breast associated with the biopsy cleft. There were 3 level I left axillary lymph nodes with thickened cortices. The largest measured 1.3 cm. There were no other findings of concern.  Her treatment for stage III disease is detailed below.  METASTATIC DISEASE:  Laura Matthews presented to the emergency room 06/06/2016 complaining of shortness of breath and pain after an episode of syncope. CT angiogram of the chest raises suspicion for possible edema, infection, or carcinomatosis. There were also numerous lytic lung lesions. Right pleural fluid cytology collected 06/08/2016 showed ([id number]) adenocarcinoma, estrogen receptor 90% positive, progesterone receptor negative, HER-2 not amplified with a signals ratio of 1.09 and number per cell 1.85. Brain MRI was obtained 06/09/2016 and showed between 15 and 20 enhancing lesions consistent with metastatic disease, chiefly involving the cerebellum.  Her subsequent history is detailed below  INTERVAL HISTORY: Laura Matthews returns today for follow-up of her now metastatic breast cancer. She was discharged from [facility] 06/12/2016, and has been receiving whole brain irradiation under Dr. Lisbeth Renshaw. She is tolerating this well, and will complete those treatments 06/27/2016. While in the hospital also she was taken off tamoxifen and started on letrozole she has had no side effects from that medication so far  REVIEW OF SYSTEMS:  Laura Matthews is currently staying at her daughter's house, but is looking for to going back to her own place. She recently hit her left shoulder accidentally and that is hurting. She is using diclofenac cream 1% with some good effect. She says her appetite is down. She has pain "everywhere". She is taking OxyContin 60 mg 5 times a day and occasional  OxyIR, without relief. She is not constipated. She sleeps poorly. She is very fatigued. She has a dry cough. She is very short  particularly when walking upstairs. She denies nausea or vomiting and has had no further episodes of syncope. She feels very forgetful, anxious, and depressed, but denies any suicidal ideation. She says her blood sugars are "okay". Note that she is currently on dexamethasone. A detailed review of systems today was otherwise noncontributory  PAST MEDICAL HISTORY: Past Medical History:  Diagnosis Date  . Anemia    low iron  . Arthritis   . Depression    pt denies   . Family history of malignant neoplasm of breast   . Head ache   . Hot flashes   . Lymphedema of breast    left  . Malignant neoplasm of breast (female), unspecified site    left breast  . Mental disorder    depression-  . Newly diagnosed diabetes 11/12/2014   chemo increasing blood glucose; not on meds now  . Radiation 04/22/15-06/23/15   Left Breast    PAST SURGICAL HISTORY: Past Surgical History:  Procedure Laterality Date  . BREAST LUMPECTOMY WITH SENTINEL LYMPH NODE BIOPSY Left 06/11/2014   Procedure: BREAST LUMPECTOMY WITH SENTINEL LYMPH NODE BX, POSSIBLE AXILLARY LYMPH NODE DISSECTION;  Surgeon: Stark Klein, MD;  Location: [place];  Service: General;  Laterality: Left;  . EVACUATION BREAST HEMATOMA Left 07/10/2014   Procedure: EVACUATION OF LEFT BREAST HEMATOMA ;  Surgeon: Stark Klein, MD;  Location: [place];  Service: General;  Laterality: Left;  . FOOT SURGERY  2011   rt  . INCISION AND DRAINAGE Left 07/02/2014   Procedure: Drainage left breast seroma;  Surgeon: Stark Klein, MD;  Location: [facility];  Service: General;  Laterality: Left;  . INCISION AND DRAINAGE ABSCESS N/A 11/12/2014   Procedure: ASPIRATION AND INCISION AND DRAINAGE LEFT BREAST ABSCESS;  Surgeon: Laura Boston, MD;  Location: [facility];  Service: General;  Laterality: N/A;  . [place]   right  . PORT-A-CATH REMOVAL Left 07/22/2015   Procedure: REMOVAL PORT-A-CATH;  Surgeon: Stark Klein, MD;  Location: [place];  Service: General;  Laterality: Left;  . PORTACATH PLACEMENT N/A 07/02/2014   Procedure: INSERTION PORT-A-CATH;  Surgeon: Stark Klein, MD;  Location: [facility];  Service: General;  Laterality: N/A;  . TUBAL LIGATION    . UTERINE FIBROID SURGERY  2005   ablation    FAMILY HISTORY Family History  Problem Relation Age of Onset  . Breast cancer Mother 51    currently 53; TAH/BSO d/t ?cervical ca at 70  . Lung cancer Father     deceased 26  . Breast cancer Maternal Aunt     dx 71s; currently in her late 74s  . Diabetes Neg Hx    the patient's father died at the age of 72 from lung cancer. The patient's mother was diagnosed with breast cancer at the age of 75. She survives. The patient had 2 brothers, and 2 sisters. One brother may have a diagnosis of "stomach cancer"  GYNECOLOGIC HISTORY:  No LMP recorded. Patient has had an ablation. Menarche age 74 which is also when the patient first had a child. She is GX P3. She stopped having periods after laser ablation in 2005.  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 She has lived with her daughter Laura Matthews and her  4 children who are 11, 8, 5, and 4. 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 Currently the patient is living iwith Laura Matthews who works as a Dietitian in [place]. The patient's daughter Laura Matthews works as a Scientist, water quality, also in [place]. The patient has 7 grandchildren. She is not a church attender    ADVANCED DIRECTIVES: she has named her daughter Laura Matthews. Laura Matthews as her healthcare power of attorney   HEALTH MAINTENANCE: Social History  Substance Use Topics  . Smoking status: Former Smoker    Packs/day: 0.25    Years: 20.00    Types: Cigarettes  . Smokeless tobacco: Never Used  . Alcohol use No     Comment: Percoset,Vicodin, Oxycotin     Colonoscopy:  PAP:  Bone density:  Lipid panel:  Allergies    Allergen Reactions  . Hydrocodone Itching and Nausea Only    Current Outpatient Prescriptions  Medication Sig Dispense Refill  . albuterol (PROVENTIL HFA;VENTOLIN HFA) 108 (90 Base) MCG/ACT inhaler Inhale 1-2 puffs into the lungs every 6 (six) hours as needed for wheezing or shortness of breath. 1 Inhaler 2  . benzonatate (TESSALON) 200 MG capsule Take 1 capsule (200 mg total) by mouth 3 (three) times daily. 20 capsule 0  . chlorpheniramine-HYDROcodone (TUSSIONEX) 10-8 MG/5ML SUER Take 5 mLs by mouth every 12 (twelve) hours as needed for cough. 140 mL 0  . dexamethasone (DECADRON) 4 MG tablet Take 1 tablet (4 mg total) by mouth every 8 (eight) hours. 120 tablet 0  . feeding supplement, GLUCERNA SHAKE, (GLUCERNA SHAKE) LIQD Take 237 mLs by mouth 3 (three) times daily between meals. 90 Can 0  . glipiZIDE (GLUCOTROL XL) 5 MG 24 hr tablet Take 2 tablets (10 mg total) by mouth daily with breakfast. 120 tablet 12  . letrozole (FEMARA) 2.5 MG tablet Take 1 tablet (2.5 mg total) by mouth daily. 30 tablet 0  . LORazepam (ATIVAN) 0.5 MG tablet Take 1 tablet (0.5 mg total) by mouth every 8 (eight) hours as needed for anxiety. 30 tablet 0  . ondansetron (ZOFRAN ODT) 4 MG disintegrating tablet Take 1 tablet (4 mg total) by mouth every 8 (eight) hours as needed for nausea or vomiting. 30 tablet 0  . oxyCODONE (OXYCONTIN) 60 MG 12 hr tablet Take 60 mg by mouth every 12 (twelve) hours. 60 tablet 0  . oxyCODONE 10 MG TABS Take 1 tablet (10 mg total) by mouth every 4 (four) hours as needed for moderate pain. 30 tablet 0  . oxyCODONE-acetaminophen (PERCOCET/ROXICET) 5-325 MG tablet Take 1-2 tablets by mouth every 6 (six) hours as needed for moderate pain. 30 tablet 0  . polyethylene glycol (MIRALAX / GLYCOLAX) packet Take 17 g by mouth 2 (two) times daily. 14 each 0   No current facility-administered medications for this visit.     OBJECTIVE: Middle-aged [place] American woman  Who appears stated age 69:    06/17/16 1214  BP: 114/63  Pulse: 82  Resp: 18  Temp: 98.4 F (36.9 C)     Body mass index is 22.44 kg/m.    ECOG FS:1 - Symptomatic but completely ambulatory  Sclerae unicteric, pupils round and equal, EOMs intact Oropharynx clear and moist-- no thrush or other lesions No cervical or supraclavicular adenopathy Lungs no rales or rhonchi Heart regular rate and rhythm Abd soft, nontender, positive bowel sounds MSK no upper extremity lymphedema Neuro: nonfocal, well oriented, tearful affect Breasts: Deferred    LAB RESULTS:  CMP  Component Value Date/Time   NA 137 06/08/2016 0549   NA 141 03/02/2015 1316   K 3.6 06/08/2016 0549   K 4.1 03/02/2015 1316   CL 101 06/08/2016 0549   CO2 28 06/08/2016 0549   CO2 27 03/02/2015 1316   GLUCOSE 146 (H) 06/08/2016 0549   GLUCOSE 91 03/02/2015 1316   BUN 8 06/08/2016 0549   BUN 10.4 03/02/2015 1316   CREATININE 0.40 (L) 06/08/2016 0549   CREATININE 0.6 03/02/2015 1316   CALCIUM 8.9 06/08/2016 0549   CALCIUM 9.4 03/02/2015 1316   PROT 6.7 06/08/2016 1324   PROT 7.3 03/02/2015 1316   ALBUMIN 4.1 03/02/2015 1316   AST 18 03/02/2015 1316   ALT 25 03/02/2015 1316   ALKPHOS 68 03/02/2015 1316   BILITOT 0.26 03/02/2015 1316   GFRNONAA >60 06/08/2016 0549   GFRAA >60 06/08/2016 0549    I No results found for: SPEP  Lab Results  Component Value Date   WBC 10.6 (H) 06/07/2016   NEUTROABS 8.1 (H) 06/07/2016   HGB 11.9 (L) 06/07/2016   HCT 35.0 (L) 06/07/2016   MCV 86.5 06/07/2016   PLT 446 (H) 06/07/2016      Chemistry      Component Value Date/Time   NA 137 06/08/2016 0549   NA 141 03/02/2015 1316   K 3.6 06/08/2016 0549   K 4.1 03/02/2015 1316   CL 101 06/08/2016 0549   CO2 28 06/08/2016 0549   CO2 27 03/02/2015 1316   BUN 8 06/08/2016 0549   BUN 10.4 03/02/2015 1316   CREATININE 0.40 (L) 06/08/2016 0549   CREATININE 0.6 03/02/2015 1316      Component Value Date/Time   CALCIUM 8.9 06/08/2016 0549    CALCIUM 9.4 03/02/2015 1316   ALKPHOS 68 03/02/2015 1316   AST 18 03/02/2015 1316   ALT 25 03/02/2015 1316   BILITOT 0.26 03/02/2015 1316       Lab Results  Component Value Date   LABCA2 1,356.9 (H) 06/08/2016    No components found for: [id number]  No results for input(s): INR in the last 168 hours.  Urinalysis    Component Value Date/Time   COLORURINE YELLOW 06/03/2014 1050   APPEARANCEUR CLEAR 06/03/2014 1050   LABSPEC >1.030 (H) 06/03/2014 1050   PHURINE 6.0 06/03/2014 1050   GLUCOSEU NEGATIVE 06/03/2014 1050   HGBUR SMALL (A) 06/03/2014 1050   BILIRUBINUR SMALL (A) 06/03/2014 1050   KETONESUR NEGATIVE 06/03/2014 1050   PROTEINUR NEGATIVE 06/03/2014 1050   UROBILINOGEN 0.2 06/03/2014 1050   NITRITE NEGATIVE 06/03/2014 1050   LEUKOCYTESUR TRACE (A) 06/03/2014 1050    STUDIES: Dg Chest 2 View  Result Date: 06/06/2016 CLINICAL DATA:  Acute onset of shortness of breath. Initial encounter. EXAM: CHEST  2 VIEW COMPARISON:  Chest radiograph and CTA of the chest performed 05/19/2016 FINDINGS: The lungs are well-aerated. Vascular congestion is noted. Mild bibasilar opacities may reflect minimal interstitial edema. There is no evidence of pleural effusion or pneumothorax. The heart is mildly enlarged. No acute osseous abnormalities are seen. Scattered clips are seen at the left axilla. IMPRESSION: Vascular congestion and mild cardiomegaly. Mild bibasilar opacities may reflect minimal interstitial edema. Electronically Signed   By: Garald Balding M.D.   On: 06/06/2016 23:13   Dg Ribs Unilateral W/chest Right  Result Date: 05/19/2016 CLINICAL DATA:  Cough 3 weeks with anterior right rib pain extending to back. History of left breast cancer. EXAM: RIGHT RIBS AND CHEST - 3+ VIEW COMPARISON:  09/17/2014  FINDINGS: Lungs are adequately inflated with mild prominence of the perihilar markings. No focal lobar consolidation or effusion. No pneumothorax. Cardiomediastinal silhouette is within  normal. Multiple surgical clips over the left lateral chest and axilla. No definite right rib fracture. IMPRESSION: Suggestion of mild vascular congestion. No right rib fracture. Electronically Signed   By: Marin Olp M.D.   On: 05/19/2016 15:59   Ct Angio Chest Pe W And/or Wo Contrast  Result Date: 06/07/2016 CLINICAL DATA:  Subacute onset of nonproductive cough, fever and nausea. 21 pounds of weight loss. Generalized back and chest pain. Shortness of breath with exertion. Mild leukocytosis. Initial encounter. EXAM: CT ANGIOGRAPHY CHEST WITH CONTRAST TECHNIQUE: Multidetector CT imaging of the chest was performed using the standard protocol during bolus administration of intravenous contrast. Multiplanar CT image reconstructions and MIPs were obtained to evaluate the vascular anatomy. CONTRAST:  100 mL of Isovue 300 IV contrast COMPARISON:  CTA of the chest performed 05/19/2016, and chest radiograph performed 06/06/2016 FINDINGS: There is no evidence of pulmonary embolus. Small bilateral pleural effusions are noted. Note is made of vague soft tissue density tracking about the right hilum, measuring approximately 3.7 x 3.7 x 2.0 cm, with mild mass effect on the right-sided pulmonary arteries and right-sided bronchioles. This raises concern for malignancy. Interstitial prominence is noted, with bibasilar airspace opacities, raising concern for pulmonary edema, though underlying pneumonia cannot be excluded. Underlying fissural nodularity on the right side could reflect lymphangitic carcinomatosis, as previously noted. Peripheral scarring is noted. No pneumothorax is identified. Trace pericardial fluid is noted. This may be within normal limits, though malignant pericardial effusion cannot be excluded. No definite mediastinal lymphadenopathy is seen. Vague soft tissue density tracks about the subcarinal region and azygoesophageal recess. The great vessels are grossly unremarkable in appearance. No axillary  lymphadenopathy is seen. The visualized portions of the thyroid gland are unremarkable in appearance. There is diffuse skin thickening and irregularity along the left breast. Would correlate for any evidence of inflammatory breast cancer. Postoperative change and a small postoperative fluid collection measuring 2.8 cm are noted underlying the left nipple, with retraction of the left nipple. The visualized portions of the liver and spleen are unremarkable. Numerous lytic lesions are noted throughout the visualized thoracic and upper lumbar spine, concerning for metastatic disease. A small sclerotic focus is also noted at T5. Review of the MIP images confirms the above findings. IMPRESSION: 1. No evidence of pulmonary embolus. 2. Small bilateral pleural effusions noted. 3. Vague soft tissue density tracking about the right hilum, measuring approximately 3.7 x 3.7 x 2.0 cm, with mild mass effect on the right-sided pulmonary arteries and right-sided bronchioles. This raises concern for malignancy. Underlying right-sided pleural nodularity could reflect lymphangitic carcinomatosis, as previously noted. 4. Interstitial prominence, with bibasilar airspace opacities, concerning for pulmonary edema, though underlying pneumonia cannot be excluded. 5. Trace pericardial fluid may remain within normal limits, though malignant pericardial effusion cannot be excluded. Vague soft tissue density tracks about the subcarinal region and azygoesophageal recess, concerning for extension of malignancy. 6. Diffuse skin thickening and irregularity along the left breast. Would correlate for any evidence of inflammatory breast cancer. Underlying postoperative change and small postoperative fluid collection measuring 2.8 cm, with retraction of the left nipple. 7. Numerous lytic lesions throughout the visualized thoracic and upper lumbar spine, concerning for metastatic disease. Small sclerotic focus also noted at T5. Electronically Signed    By: Garald Balding M.D.   On: 06/07/2016 04:34   Ct Angio Chest Pe  W/cm &/or Wo Cm  Result Date: 05/20/2016 CLINICAL DATA:  Right chest pain and elevated D-dimer. EXAM: CT ANGIOGRAPHY CHEST WITH CONTRAST TECHNIQUE: Multidetector CT imaging of the chest was performed using the standard protocol during bolus administration of intravenous contrast. Multiplanar CT image reconstructions and MIPs were obtained to evaluate the vascular anatomy. CONTRAST:  80 cc Isovue 370 intravenous COMPARISON:  05/29/2014 head CT FINDINGS: Cardiovascular: Limited pulmonary artery CTA, especially beyond the segmental level. This appears secondary to transient interruption of contrast. No evidence of pulmonary embolism. Negative thoracic aorta. Normal heart size. No pericardial effusion. Mediastinum: Increased soft tissue in the right hilum without discrete measurable nodule. Lungs/Pleura: Diffuse Kerley lines consistent with mild edema. Small pleural effusion on the right without convincing nodularity. Subpleural reticulation left upper lobe is likely radiation fibrosis. There is asymmetric airway thickening on the right with thickened major and minor fissures around the right middle lobe that has nodular morphology and neighboring airspace opacity. Air trapping bilaterally. Upper abdomen: Hepatic steatosis. Mildly lobulated adrenal glands without discrete mass. Musculoskeletal: Lumpectomy site in the left breast with central fluid like density. Area measures 23 mm. Left breast is thick walled, potentially radiation related. Soft tissue nodularity, nonspecific, around left axillary clips. Patient is followed with mammography, most recently 01/06/2016. Review of the MIP images confirms the above findings. IMPRESSION: 1. Limited CTA of the pulmonary arteries. Negative for pulmonary embolism to the segmental level. 2. Mild pulmonary edema with small right pleural effusion. 3. Right-sided fissural nodularity and asymmetric hilar soft  tissue is primarily concerning for lymphangitic carcinomatosis. Atypical infection is also considered in the acute setting. 4. Left axillary dissection with unexpected soft tissue around the surgical clips. 5. Suggest PET-CT follow-up after convalescence to evaluate #3 and #4. Electronically Signed   By: Monte Fantasia M.D.   On: 05/20/2016 00:03   [name] DU Contrast  Result Date: 06/09/2016 CLINICAL DATA:  Metastatic breast cancer. EXAM: MRI HEAD WITHOUT AND WITH CONTRAST TECHNIQUE: Multiplanar, multiecho pulse sequences of the brain and surrounding structures were obtained without and with intravenous contrast. CONTRAST:  66m MULTIHANCE GADOBENATE DIMEGLUMINE 529 MG/ML IV SOLN COMPARISON:  06/02/2014 FINDINGS: No definite acute infarct is identified. There are a few punctate foci of scattered trace diffusion signal abnormality in both cerebral hemispheres, most notably in the posterior left frontal lobe and right temporal lobe without clearly restricted diffusion. No intracranial hemorrhage, midline shift, or extra-axial fluid collection is present. The ventricles and sulci are normal in size. There are 12-15 subcentimeter enhancing lesions in the cerebellar hemispheres measuring up to 5 mm in size. Two 1 mm enhancing lesions are questioned in the right parietal lobe (series 11, images 31 and 35, not clearly confirmed on coronal or sagittal sequences). There is a 2 mm enhancing lesion more superiorly and medially in the right parietal lobe (series 11, image 39). A 7 mm enhancing lesion is present in the left frontal lobe (series 11, image 40). There is a 2 mm enhancing lesion in the anterior inferior left temporal lobe (series 12, image 15 and series 13, image 19). A 1 mm lesion is questioned in the posterior left temporal lobe adjacent to the temporal horn (series 12, image 10). Mild motion artifact on postcontrast sequences could potentially obscure additional tiny lesions. There is no significant  enhancement associated with any of the lesions. Orbits are unremarkable. Paranasal sinuses and mastoid air cells are clear. Major intracranial vascular flow voids are preserved. No destructive osseous lesion is identified. IMPRESSION:  Numerous (15-20) subcentimeter enhancing lesions consistent with metastases and with greatest involvement of the cerebellum. No edema. Motion artifact may obscure additional tiny lesions. Electronically Signed   By: Logan Bores M.D.   On: 06/09/2016 20:54   Dg Chest Port 1 View  Result Date: 06/09/2016 CLINICAL DATA:  Follow-up pleural effusion.  Breast cancer. EXAM: PORTABLE CHEST 1 VIEW COMPARISON:  Chest radiograph from one day prior. FINDINGS: Surgical clips throughout the left axilla and left lateral breast are again noted. Stable cardiomediastinal silhouette with normal heart size. No pneumothorax. Stable trace bilateral pleural effusions. Hazy and linear opacities throughout both lungs are not appreciably changed. IMPRESSION: 1. No pneumothorax. 2. Stable trace bilateral pleural effusions. 3. Stable hazy and linear opacities throughout both lungs. Given the normal heart size and history of breast cancer, this finding raises concern for lymphangitic tumor as noted on recent chest CT studies. Electronically Signed   By: Ilona Sorrel M.D.   On: 06/09/2016 12:31   Dg Chest Port 1 View  Result Date: 06/08/2016 CLINICAL DATA:  RIGHT pleural effusion post thoracentesis EXAM: PORTABLE CHEST 1 VIEW COMPARISON:  Portable exam 1310 hours compared earlier study of 01/1939 2 hours FINDINGS: Minimal enlargement of cardiac silhouette. Slight pulmonary vascular congestion with persistent pulmonary edema unchanged. Improved aeration RIGHT base. No pneumothorax following thoracentesis. Surgical clips RIGHT axilla. No acute osseous findings. IMPRESSION: No pneumothorax following RIGHT thoracentesis. Persistent pulmonary edema. Electronically Signed   By: Lavonia Dana M.D.   On: 06/08/2016  13:33   Dg Chest Port 1 View  Result Date: 06/08/2016 CLINICAL DATA:  Breast cancer. EXAM: PORTABLE CHEST 1 VIEW COMPARISON:  CT 06/07/2016 .  Chest x-ray 06/06/2016. FINDINGS: Mediastinum stable. Stable cardiomegaly. Persistent diffuse bilateral pulmonary infiltrates and/or edema. No pleural effusion or pneumothorax. Surgical clips left chest wall. IMPRESSION: 1. Persistent bilateral pulmonary infiltrates and or edema. No significant change. 2.  Stable cardiomegaly. Electronically Signed   By: Marcello Moores  Register   On: 06/08/2016 06:40    ASSESSMENT: 50 y.o. [place] woman status post left breast upper outer quadrant biopsy 05/09/2014 for a clinicalT1c N1, stage IIA invasive ductal carcinoma, grade 2, estrogen and progesterone receptor positive, HER-2 nonamplified, with an MIB-1 of 80%  (1) patient met with genetics counselor 05/29/2014 but decided against genetics testing   (2) tobacco abuse. The patient has been strongly urged to discontinue smoking. She was prescribed a nicotine patch 09/02/2014  (3) status post left lumpectomy and axillary lymph node dissection 06/11/2014 for a pT1c pN3, stage IIIC invasive ductal carcinoma, grade 3, with repeat HER-2 negative  (4) adjuvant chemotherapy consisting of doxorubicin and cyclophosphamide in dose dense fashion x4, completed 09/09/2014, followed by paclitaxel weekly x12 completed 03/09/2015  (5) adjuvant radiation 04/22/15-06/23/15 Left breast/ 45 Gy at 1.8 Gy per fraction x 25 fractions.  Left supraclavicular fossa and axilla/ 45 Gy at 1.8 Gy per fraction x 25 fractions Left breast boost/ 16 Gy at 2 Gy per fraction x 8 fractions  (6) tamoxifen started 07/01/2015, discontinued 06/09/2016 with evidence of disease progression  METASTATIC DISEASE: involving brain, lungs and bone documented August 2017 (7) CT angio 06/06/2016 c/w lymphangitic carcinomatosis and lytic bone lesions                 (a) right thoracentesis 06/08/2016-- cytology  confirms adenocarcinoma, estrogen receptor positive                 (b) brain MRI 06/09/2016 shows multiple metastases  (8) starting letrozole 06/09/2016; palbociclib  added08/18/2017  (9) radiation to CNS and spine scheduled 06/13/2016 through 06/27/2016  (10) poorly controlled pain:   (a) adjusted to 180 mg oxycontin BID with OxyIR PRN as of 06/17/2016  (11) denosumab/Xgeva to start September 2017.   PLAN: Kaliann is very distraught because of her overall situation, of course, and also because she was told in the emergency room that she should simply go straight to hospice. Today we discussed the fact that stage IV breast cancer is not curable but it is treatable. She understands that stage IV breast cancer is not curable with our current knowledge base. The goal of treatment is control. The strategy of treatment is to do only the minimum necessary to control the growth of the tumor so that the patient can have as normal a life as possible. There is no survival advantage in treating aggressively if treating less aggressively results in tumor control. With this strategy stage IV breast cancer in many cases can function as a "chronic illness": something that cannot be quite gotten rid of but can be controlled for an indefinite period of time  There are always 3 questions to go over and so we reviewed those today. The first question is do we treat or not. In some cases the patient's overall situation is so discouraging that palliative/comfort care alone is appropriate. If the decision is made to treat, then the next question is whether anti-estrogens or chemotherapy is more appropriate. Once that decision is made than the third question is: Which agent or combination of agents in particular should be used?  Laura Matthews is very clear that she does desire to eat metastases. Her functional status is good and she would be able to tolerate any of the modalities available. As far as her brain metastases is  concerned, chemotherapy does not cross the blood-brain barrier. On the other hand anti-estrogens can have effects on brain metastases bite priming doses cells of estrogen. Accordingly the plan is for her to complete her radiation treatments, and then intensify her antiestrogen therapy. She is already on letrozole. We will add palbociclib and I discussed the possible toxicities, side effects and complications with her today. I went ahead and placed the prescription but we will not start that medication on till she is closer to completing her radiation treatments, so as not to interfere with that therapy through the possible drop in counts.  She will benefit from denosumab/Xgeva, and we will start that also within the next 2 weeks.  We then concentrated on pain control issues. She is taking her pain medicine irregularly. I wrote down for her that she will take 3 tablets of the long-acting OxyContin 60 mg in the morning and in the evening and at no other time. If she has pain that is not controlled by that dose she will take OxyIR. I asked her to keep a written record of when she takes which medication and to bring me the bottles at the next visit here, which will be next week.  Laura Matthews tells me she has named her daughter as Corporate treasurer power of attorney. It would be helpful if she could come with the patient at the next visit or 2 to review the overall situation. I am hopeful we will be able to obtain control of this disease within measures just outlined, but particularly if we cannot obtain control of the central nervous system disease then a hospice referral would be appropriate. Chauncey Cruel, MD 06/17/16

## 2016-06-17 NOTE — Progress Notes (Addendum)
Weekly rad txs  Whole brain and t-spine,3/10 completed,  no appetite , taking decadron 4 mg tid, patient confused had pills thought decadron was for nausea, re-emphasized to take steroid 3x day, patient says appetite not good, taking her pain meds, had a head ache a few days ago, just saw Dr. [name] , here before treatment, has sonafine , no skin changes 1:24 PM BP 139/73 (BP Location: Right Arm, Patient Position: Sitting, Cuff Size: Normal)   Pulse 96   Temp 98.4 F (36.9 C) (Oral)   Resp 20   Wt Readings from Last 3 Encounters:  06/17/16 156 lb 6.4 oz (70.9 kg)  06/07/16 158 lb 15.2 oz (72.1 kg)  06/09/16 158 lb (71.7 kg)

## 2016-06-17 NOTE — Telephone Encounter (Signed)
Oral Chemotherapy Pharmacist Encounter  Received notification from [place] that the pt's went through and her copay will be $3. The medication should be ready for the patient to pick up Monday. [facility] will call patient when medication is ready. Called patient and relayed this information to her.  At time of call patient was busy and did not want patient education. Will attempt to call patient next week after she has picked up medication.  Thank you,  Nuala Alpha, PharmD [facility] [phone]

## 2016-06-17 NOTE — Progress Notes (Signed)
  Radiation Oncology         [phone] ________________________________  Name: AZHANE ECKART MRN: [id number]  Date: 06/10/2016  DOB: [birth year]  SIMULATION AND TREATMENT PLANNING NOTE  DIAGNOSIS:     ICD-9-CM ICD-10-CM   1. Breast cancer of upper-outer quadrant of left female breast ([place]) 174.4 C50.412      Site:   1.  Whole brain radiation treatment 2.  Treatment to approximately T10-L5 spine  NARRATIVE:  The patient was brought to the [facility].  Identity was confirmed.  All relevant records and images related to the planned course of therapy were reviewed.   Written consent to proceed with treatment was confirmed which was freely given after reviewing the details related to the planned course of therapy had been reviewed with the patient.  Then, the patient was set-up in a stable reproducible  supine position for radiation therapy.  CT images were obtained.  Surface markings were placed.    Medically necessary complex treatment device(s) for immobilization:  1.  Customized vac lock bag, 2.  Customized thermoplastic head cast.   The CT images were loaded into the planning software.  Then the target and avoidance structures were contoured.  Treatment planning then occurred.  The radiation prescription was entered and confirmed.  A total of 4 complex treatment devices were fabricated which relate to the designed radiation treatment fields. Each of these customized fields/ complex treatment devices will be used on a daily basis during the radiation course.  A reduced field technique will be utilized to treat the brain using an additional 2 fields. Therefore a total of 6 customized treatment fields will be used on a daily basis. I have requested : 3-D conformal technique. The target volume in addition to the spinal cord, left kidney, right kidney will be evaluated as part of the 3-D conformal treatment planning process.Marland Kitchen   PLAN:  The patient will receive 30 Gy in 10  fractions to each of these 2 separate target regions.  ________________________________   Jodelle Gross, MD, PhD

## 2016-06-17 NOTE — Progress Notes (Signed)
Department of Radiation Oncology  Phone:  [phone] Fax:        [phone]  Weekly Treatment Note    Name: Laura Matthews Date: 06/17/2016 MRN: [id number] DOB: Oct 08, 1966   Diagnosis:     ICD-9-CM ICD-10-CM   1. Breast cancer of upper-outer quadrant of left female breast ([place]) 174.4 C50.412      Current dose: 12 Gy  Current fraction: 4   MEDICATIONS: Current Outpatient Prescriptions  Medication Sig Dispense Refill  . benzonatate (TESSALON) 200 MG capsule Take 1 capsule (200 mg total) by mouth 3 (three) times daily. 20 capsule 0  . chlorpheniramine-HYDROcodone (TUSSIONEX) 10-8 MG/5ML SUER Take 5 mLs by mouth every 12 (twelve) hours as needed for cough. 140 mL 0  . dexamethasone (DECADRON) 4 MG tablet Take 1 tablet (4 mg total) by mouth every 8 (eight) hours. 120 tablet 0  . feeding supplement, GLUCERNA SHAKE, (GLUCERNA SHAKE) LIQD Take 237 mLs by mouth 3 (three) times daily between meals. 90 Can 0  . glipiZIDE (GLUCOTROL XL) 5 MG 24 hr tablet Take 2 tablets (10 mg total) by mouth daily with breakfast. 120 tablet 12  . letrozole (FEMARA) 2.5 MG tablet Take 1 tablet (2.5 mg total) by mouth daily. 30 tablet 0  . LORazepam (ATIVAN) 0.5 MG tablet Take 1 tablet (0.5 mg total) by mouth every 8 (eight) hours as needed for anxiety. 30 tablet 0  . mirtazapine (REMERON) 30 MG tablet Take 1 tablet (30 mg total) by mouth at bedtime. 30 tablet 3  . naproxen sodium (ALEVE) 220 MG tablet Take 2 tablets (440 mg total) by mouth 3 (three) times daily with meals. 180 tablet 3  . ondansetron (ZOFRAN ODT) 4 MG disintegrating tablet Take 1 tablet (4 mg total) by mouth every 8 (eight) hours as needed for nausea or vomiting. 30 tablet 0  . oxyCODONE (OXYCONTIN) 60 MG 12 hr tablet Take 180 mg by mouth every 12 (twelve) hours. 84 each 0  . Oxycodone HCl 10 MG TABS Take 1-2 tablets (10-20 mg total) by mouth every 2 (two) hours as needed. 80 tablet 0  . palbociclib (IBRANCE) 125 MG capsule Take 1  capsule (125 mg total) by mouth daily with breakfast. Take whole with food. 21 capsule 6  . polyethylene glycol (MIRALAX / GLYCOLAX) packet Take 17 g by mouth 2 (two) times daily. 14 each 0   No current facility-administered medications for this encounter.      ALLERGIES: Hydrocodone   LABORATORY DATA:  Lab Results  Component Value Date   WBC 10.6 (H) 06/07/2016   HGB 11.9 (L) 06/07/2016   HCT 35.0 (L) 06/07/2016   MCV 86.5 06/07/2016   PLT 446 (H) 06/07/2016   Lab Results  Component Value Date   NA 137 06/08/2016   K 3.6 06/08/2016   CL 101 06/08/2016   CO2 28 06/08/2016   Lab Results  Component Value Date   ALT 25 03/02/2015   AST 18 03/02/2015   ALKPHOS 68 03/02/2015   BILITOT 0.26 03/02/2015     NARRATIVE: Laura Matthews was seen today for weekly treatment management. The chart was checked and the patient's films were reviewed.  Weekly rad txs  Whole brain and t-spine,3/10 completed,  no appetite , taking decadron 4 mg tid, patient confused had pills thought decadron was for nausea, re-emphasized to take steroid 3x day, patient says appetite not good, taking her pain meds, had a head ache a few days ago, just saw Dr. [name] ,  here before treatment, has sonafine , no skin changes 1:46 PM BP 139/73 (BP Location: Right Arm, Patient Position: Sitting, Cuff Size: Normal)   Pulse 96   Temp 98.4 F (36.9 C) (Oral)   Resp 20   Wt Readings from Last 3 Encounters:  06/17/16 156 lb 6.4 oz (70.9 kg)  06/07/16 158 lb 15.2 oz (72.1 kg)  06/09/16 158 lb (71.7 kg)    PHYSICAL EXAMINATION: oral temperature is 98.4 F (36.9 C). Her blood pressure is 139/73 and her pulse is 96. Her respiration is 20.        ASSESSMENT: The patient is doing satisfactorily with treatment.  PLAN: We will continue with the patient's radiation treatment as planned. The patient will continue steroids on a regular basis, taking 4 mg 3 times a day. As noted above, there was some confusion and she  was taking this as needed on a when necessary basis for nausea.

## 2016-06-17 NOTE — Telephone Encounter (Signed)
appt made and avs printed °

## 2016-06-17 NOTE — Progress Notes (Signed)
  Radiation Oncology         [phone] ________________________________  Name: Laura Matthews MRN: [id number]  Date: 06/10/2016  DOB: 1966-05-08  Optical Surface Tracking Plan:  Since intensity modulated radiotherapy (IMRT) and 3D conformal radiation treatment methods are predicated on accurate and precise positioning for treatment, intrafraction motion monitoring is medically necessary to ensure accurate and safe treatment delivery.  The ability to quantify intrafraction motion without excessive ionizing radiation dose can only be performed with optical surface tracking. Accordingly, surface imaging offers the opportunity to obtain 3D measurements of patient position throughout IMRT and 3D treatments without excessive radiation exposure.  I am ordering optical surface tracking for this patient's upcoming course of radiotherapy. ________________________________  Kyung Rudd, MD 06/17/2016 6:27 PM    Reference:   Particia Jasper, et al. Surface imaging-based analysis of intrafraction motion for breast radiotherapy patients.Journal of [place], n. 6, nov. 2014. ISSN [id number].   Available at: <http://www.jacmp.org/index.php/jacmp/article/view/4957>.

## 2016-06-20 ENCOUNTER — Ambulatory Visit
Admission: RE | Admit: 2016-06-20 | Discharge: 2016-06-20 | Disposition: A | Discharge status: Home / Self Care | Payer: Medicaid Other | Source: Ambulatory Visit | Attending: Radiation Oncology | Admitting: Radiation Oncology

## 2016-06-20 DIAGNOSIS — Z510 Encounter for antineoplastic radiation therapy: Secondary | ICD-10-CM | POA: Diagnosis not present

## 2016-06-21 ENCOUNTER — Ambulatory Visit
Admission: RE | Admit: 2016-06-21 | Discharge: 2016-06-21 | Disposition: A | Discharge status: Home / Self Care | Payer: Medicaid Other | Source: Ambulatory Visit | Attending: Radiation Oncology | Admitting: Radiation Oncology

## 2016-06-21 DIAGNOSIS — Z510 Encounter for antineoplastic radiation therapy: Secondary | ICD-10-CM | POA: Diagnosis not present

## 2016-06-22 ENCOUNTER — Ambulatory Visit
Admission: RE | Admit: 2016-06-22 | Discharge: 2016-06-22 | Disposition: A | Discharge status: Home / Self Care | Payer: Medicaid Other | Source: Ambulatory Visit | Attending: Radiation Oncology | Admitting: Radiation Oncology

## 2016-06-22 DIAGNOSIS — Z510 Encounter for antineoplastic radiation therapy: Secondary | ICD-10-CM | POA: Diagnosis not present

## 2016-06-22 MED FILL — IBRANCE 125 MG CAPSULE: 125 | 28 days supply | Qty: 21 | Fill #0

## 2016-06-23 ENCOUNTER — Telehealth: Payer: Self-pay | Admitting: Pharmacist

## 2016-06-23 ENCOUNTER — Ambulatory Visit: Payer: Medicaid Other

## 2016-06-23 NOTE — Telephone Encounter (Signed)
Attempted to reach patient for follow up on oral medication: Ibrance. No answer. Left VM for patient to call back with any questions or issues.  I informed her that her RX is ready for pick up at [facility].  Copay $3.  Thank you, Kennith Center, Pharm.D., CPP 06/23/2016'@4'$ :30 PM Oral Chemotherapy Clinic

## 2016-06-24 ENCOUNTER — Ambulatory Visit (HOSPITAL_BASED_OUTPATIENT_CLINIC_OR_DEPARTMENT_OTHER): Payer: Medicaid Other | Admitting: Oncology

## 2016-06-24 ENCOUNTER — Encounter: Payer: Self-pay | Admitting: Radiation Oncology

## 2016-06-24 ENCOUNTER — Other Ambulatory Visit (HOSPITAL_BASED_OUTPATIENT_CLINIC_OR_DEPARTMENT_OTHER): Payer: Medicaid Other

## 2016-06-24 ENCOUNTER — Ambulatory Visit
Admission: RE | Admit: 2016-06-24 | Discharge: 2016-06-24 | Disposition: A | Discharge status: Home / Self Care | Payer: No Typology Code available for payment source | Source: Ambulatory Visit | Attending: Radiation Oncology | Admitting: Radiation Oncology

## 2016-06-24 ENCOUNTER — Ambulatory Visit: Payer: Medicaid Other

## 2016-06-24 ENCOUNTER — Ambulatory Visit
Admission: RE | Admit: 2016-06-24 | Discharge: 2016-06-24 | Disposition: A | Discharge status: Home / Self Care | Payer: Medicaid Other | Source: Ambulatory Visit | Attending: Radiation Oncology | Admitting: Radiation Oncology

## 2016-06-24 VITALS — BP 157/86 | HR 74 | Temp 98.8°F | Resp 19 | Wt 152.5 lb

## 2016-06-24 VITALS — BP 157/92 | HR 78 | Temp 99.2°F | Resp 20 | Wt 153.6 lb

## 2016-06-24 DIAGNOSIS — C773 Secondary and unspecified malignant neoplasm of axilla and upper limb lymph nodes: Secondary | ICD-10-CM | POA: Diagnosis not present

## 2016-06-24 DIAGNOSIS — C7800 Secondary malignant neoplasm of unspecified lung: Secondary | ICD-10-CM

## 2016-06-24 DIAGNOSIS — Z720 Tobacco use: Secondary | ICD-10-CM | POA: Diagnosis not present

## 2016-06-24 DIAGNOSIS — Z510 Encounter for antineoplastic radiation therapy: Secondary | ICD-10-CM | POA: Diagnosis not present

## 2016-06-24 DIAGNOSIS — C50412 Malignant neoplasm of upper-outer quadrant of left female breast: Secondary | ICD-10-CM | POA: Diagnosis not present

## 2016-06-24 DIAGNOSIS — C50912 Malignant neoplasm of unspecified site of left female breast: Secondary | ICD-10-CM

## 2016-06-24 DIAGNOSIS — C7951 Secondary malignant neoplasm of bone: Secondary | ICD-10-CM

## 2016-06-24 DIAGNOSIS — C7931 Secondary malignant neoplasm of brain: Secondary | ICD-10-CM | POA: Diagnosis not present

## 2016-06-24 DIAGNOSIS — R52 Pain, unspecified: Secondary | ICD-10-CM | POA: Diagnosis not present

## 2016-06-24 DIAGNOSIS — C7802 Secondary malignant neoplasm of left lung: Secondary | ICD-10-CM

## 2016-06-24 LAB — COMPREHENSIVE METABOLIC PANEL
ALBUMIN: 3.7 g/dL (ref 3.5–5.0)
ALT: 17 U/L (ref 0–55)
AST: 19 U/L (ref 5–34)
Alkaline Phosphatase: 118 U/L (ref 40–150)
Anion Gap: 10 mEq/L (ref 3–11)
BUN: 14.3 mg/dL (ref 7.0–26.0)
CO2: 27 mEq/L (ref 22–29)
CREATININE: 0.7 mg/dL (ref 0.6–1.1)
Calcium: 9.5 mg/dL (ref 8.4–10.4)
Chloride: 98 mEq/L (ref 98–109)
EGFR: >90 mL/min/{1.73_m2} (ref >90)
Glucose: 193 mg/dl — ABNORMAL HIGH (ref 70–140)
POTASSIUM: 4.3 meq/L (ref 3.5–5.1)
Sodium: 135 mEq/L — ABNORMAL LOW (ref 136–145)
TOTAL PROTEIN: 7.3 g/dL (ref 6.4–8.3)
Total Bilirubin: 0.31 mg/dL (ref 0.20–1.20)

## 2016-06-24 LAB — CBC WITH DIFFERENTIAL/PLATELET
BASO%: 0 % (ref 0.0–2.0)
Basophils Absolute: 0 10*3/uL (ref 0.0–0.1)
EOS ABS: 0 10*3/uL (ref 0.0–0.5)
EOS%: 0 % (ref 0.0–7.0)
HCT: 38.2 % (ref 34.8–46.6)
HEMOGLOBIN: 12.7 g/dL (ref 11.6–15.9)
LYMPH%: 2.8 % — AB (ref 14.0–49.7)
MCH: 28.9 pg (ref 25.1–34.0)
MCHC: 33.2 g/dL (ref 31.5–36.0)
MCV: 87 fL (ref 79.5–101.0)
MONO#: 0.5 10*3/uL (ref 0.1–0.9)
MONO%: 5 % (ref 0.0–14.0)
NEUT%: 92.2 % — ABNORMAL HIGH (ref 38.4–76.8)
NEUTROS ABS: 9.5 10*3/uL — AB (ref 1.5–6.5)
Platelets: 284 10*3/uL (ref 145–400)
RBC: 4.39 10*6/uL (ref 3.70–5.45)
RDW: 14.6 % — ABNORMAL HIGH (ref 11.2–14.5)
WBC: 10.3 10*3/uL (ref 3.9–10.3)
lymph#: 0.3 10*3/uL — ABNORMAL LOW (ref 0.9–3.3)

## 2016-06-24 MED ORDER — SUCRALFATE 1 G PO TABS: 1.0000 g | ORAL_TABLET | Freq: Four times a day (QID) | ORAL | 2 refills | Status: DC

## 2016-06-24 MED ORDER — PROCHLORPERAZINE MALEATE 10 MG PO TABS: 10.0000 mg | ORAL_TABLET | Freq: Four times a day (QID) | ORAL | 0 refills | Status: DC | PRN

## 2016-06-24 NOTE — Progress Notes (Signed)
Department of Radiation Oncology  Phone:  [phone] Fax:        [phone]  Weekly Treatment Note    Name: Laura Matthews Date: 06/26/2016 MRN: [id number] DOB: [birth year]   Diagnosis:     ICD-9-CM ICD-10-CM   1. Breast cancer of upper-outer quadrant of left female breast ([place]) 174.4 C50.412      Current dose: 24 Gy Spine  /  21 Gy Whole Brain  Current fraction: 8 Spine / 7 Whole Brain   MEDICATIONS: Current Outpatient Prescriptions  Medication Sig Dispense Refill  . dexamethasone (DECADRON) 4 MG tablet Take 1 tablet (4 mg total) by mouth every 8 (eight) hours. 120 tablet 0  . feeding supplement, GLUCERNA SHAKE, (GLUCERNA SHAKE) LIQD Take 237 mLs by mouth 3 (three) times daily between meals. 90 Can 0  . glipiZIDE (GLUCOTROL XL) 5 MG 24 hr tablet Take 2 tablets (10 mg total) by mouth daily with breakfast. 120 tablet 12  . letrozole (FEMARA) 2.5 MG tablet Take 1 tablet (2.5 mg total) by mouth daily. 30 tablet 0  . LORazepam (ATIVAN) 0.5 MG tablet Take 1 tablet (0.5 mg total) by mouth every 8 (eight) hours as needed for anxiety. 30 tablet 0  . mirtazapine (REMERON) 30 MG tablet Take 1 tablet (30 mg total) by mouth at bedtime. 30 tablet 3  . naproxen sodium (ALEVE) 220 MG tablet Take 2 tablets (440 mg total) by mouth 3 (three) times daily with meals. 180 tablet 3  . ondansetron (ZOFRAN ODT) 4 MG disintegrating tablet Take 1 tablet (4 mg total) by mouth every 8 (eight) hours as needed for nausea or vomiting. 30 tablet 0  . oxyCODONE (OXYCONTIN) 60 MG 12 hr tablet Take 180 mg by mouth every 12 (twelve) hours. 84 each 0  . Oxycodone HCl 10 MG TABS Take 1-2 tablets (10-20 mg total) by mouth every 2 (two) hours as needed. 80 tablet 0  . palbociclib (IBRANCE) 125 MG capsule Take 1 capsule (125 mg total) by mouth daily with breakfast. Take whole with food. 21 capsule 6  . polyethylene glycol (MIRALAX / GLYCOLAX) packet Take 17 g by mouth 2 (two) times daily. 14 each 0  .  benzonatate (TESSALON) 200 MG capsule Take 1 capsule (200 mg total) by mouth 3 (three) times daily. (Patient not taking: Reported on 06/24/2016) 20 capsule 0  . chlorpheniramine-HYDROcodone (TUSSIONEX) 10-8 MG/5ML SUER Take 5 mLs by mouth every 12 (twelve) hours as needed for cough. (Patient not taking: Reported on 06/24/2016) 140 mL 0  . oxycodone (ROXICODONE) 30 MG immediate release tablet Take 30 mg by mouth daily.  0  . prochlorperazine (COMPAZINE) 10 MG tablet Take 1 tablet (10 mg total) by mouth every 6 (six) hours as needed for nausea or vomiting. 30 tablet 0  . sucralfate (CARAFATE) 1 g tablet Take 1 tablet (1 g total) by mouth 4 (four) times daily. 120 tablet 2   No current facility-administered medications for this encounter.      ALLERGIES: Hydrocodone   LABORATORY DATA:  Lab Results  Component Value Date   WBC 10.3 06/24/2016   HGB 12.7 06/24/2016   HCT 38.2 06/24/2016   MCV 87.0 06/24/2016   PLT 284 06/24/2016   Lab Results  Component Value Date   NA 135 (L) 06/24/2016   K 4.3 06/24/2016   CL 101 06/08/2016   CO2 27 06/24/2016   Lab Results  Component Value Date   ALT 17 06/24/2016   AST 19 06/24/2016  ALKPHOS 118 06/24/2016   BILITOT 0.31 06/24/2016     NARRATIVE: Laura Matthews was seen today for weekly treatment management. The chart was checked and the patient's films were reviewed.  The patient complains of abdominal pain and back pain. She is taking oxycodone with the pain a 3/10 now. She has nausea and zofran doesn't help. She ate oatmeal this morning and drinks plenty water. Denies skin changes, has regular bowels, vision changes, or dizziness. She has headaches. She is taking Decadron 4 mg TID.  8:01 PM BP (!) 157/92 (BP Location: Right Arm, Patient Position: Sitting, Cuff Size: Normal)   Pulse 78   Temp 99.2 F (37.3 C) (Oral)   Resp 20   Wt 153 lb 9.6 oz (69.7 kg)   SpO2 99% Comment: room air  BMI 22.04 kg/m   Wt Readings from Last 3  Encounters:  06/24/16 153 lb 9.6 oz (69.7 kg)  06/24/16 152 lb 8 oz (69.2 kg)  06/17/16 156 lb 6.4 oz (70.9 kg)    PHYSICAL EXAMINATION: weight is 153 lb 9.6 oz (69.7 kg). Her oral temperature is 99.2 F (37.3 C). Her blood pressure is 157/92 (abnormal) and her pulse is 78. Her respiration is 20 and oxygen saturation is 99%.        ASSESSMENT: The patient is doing satisfactorily with treatment.  PLAN: We will continue with the patient's radiation treatment as planned. The patient will continue steroids since she still has headaches. I will see her on her last day of treatment. I will prescribe carafate (for esophagitis) and compazine (for ongoing nausea despite zofran use).  This document serves as a record of services personally performed by Kyung Rudd, MD. It was created on his behalf by Darcus Austin, a trained medical scribe. The creation of this record is based on the scribe's personal observations and the provider's statements to them. This document has been checked and approved by the attending provider.

## 2016-06-24 NOTE — Progress Notes (Signed)
Weekly rad txs whole brain and t spine -lumbar 8/10  Completed, c/o stomach pain, back pain, taking oxycodone , 3/10 scale now, zofran doesn't help her nausea, wants something else,  Ate oatmeal this am, drinks plenty water,  No skin changes, having bowels regular no vision changes, no dizzy ness stated 3:39 PM BP (!) 157/92 (BP Location: Right Arm, Patient Position: Sitting, Cuff Size: Normal)   Pulse 78   Temp 99.2 F (37.3 C) (Oral)   Resp 20   Wt 153 lb 9.6 oz (69.7 kg)   SpO2 99% Comment: room air  BMI 22.04 kg/m   Wt Readings from Last 3 Encounters:  06/24/16 153 lb 9.6 oz (69.7 kg)  06/17/16 156 lb 6.4 oz (70.9 kg)  06/07/16 158 lb 15.2 oz (72.1 kg)

## 2016-06-24 NOTE — Progress Notes (Signed)
[place]  [phone] [phone]     ID: Laura Matthews DOB: 04/24/1966  MR#: [id number]  PRF#:[id number]  Patient Care Team: [name], MD as PCP - General (Family Medicine) Stark Klein, MD as Consulting Physician (General Surgery) Chauncey Cruel, MD as Consulting Physician (Oncology) Thea Silversmith, MD as Consulting Physician (Radiation Oncology) [facility] Bunnie Pion, NP as Nurse Practitioner (Nurse Practitioner) Kyung Rudd, MD as Consulting Physician (Radiation Oncology)  CHIEF COMPLAINT: Stage III left breast cancer  CURRENT TREATMENT: completing palliative brain irradiation; letrozole, palbociclib  BREAST CANCER HISTORY: From the original intake note:  The patient herself palpated a mass in her left breast and brought it to the attention of [facility] NP 04/22/2014. She palpated a large nontender mass at the 4:00 position as well as a small tender mass in the 6:00 position of the left breast. The patient was set up for imaging at the breast Center where on 05/05/2014 she underwent bilateral diagnostic mammography and left breast ultrasonography. The mammogram showed an irregular mass in the outer left breast with no other suspicious findings. This was firm and fixed at the 3:00 position approximately 10 cm from the nipple. Ultrasound showed a hypoechoic irregular solid mass in this area, measuring 1.8 cm. Ultrasound of the left axilla showed 2 suspicious level I lymph nodes, the largest measuring 1 cm, with a thickened pole. A smaller lymph node measured 0.6 cm but had no visible fatty hilum.  On 05/09/2014 the patient underwent biopsy of the left breast mass (she refused biopsy of the left axilla) which showed ([id number]) and invasive ductal carcinoma, grade 2, estrogen receptor positive at 100%, with strong staining intensity, progesterone receptor 80% positive, with weak staining intensity, with an MIB-1 of 80%, and no HER-2  amplification.  On 05/16/2014 the patient underwent bilateral breast MRI. This showed a 1.7 cm irregular enhancing mass at the 3:00 position of the left breast associated with the biopsy cleft. There were 3 level I left axillary lymph nodes with thickened cortices. The largest measured 1.3 cm. There were no other findings of concern.  Her treatment for stage III disease is detailed below.  METASTATIC DISEASE:  August presented to the emergency room 06/06/2016 complaining of shortness of breath and pain after an episode of syncope. CT angiogram of the chest raises suspicion for possible edema, infection, or carcinomatosis. There were also numerous lytic lung lesions. Right pleural fluid cytology collected 06/08/2016 showed ([id number]) adenocarcinoma, estrogen receptor 90% positive, progesterone receptor negative, HER-2 not amplified with a signals ratio of 1.09 and number per cell 1.85. Brain MRI was obtained 06/09/2016 and showed between 15 and 20 enhancing lesions consistent with metastatic disease, chiefly involving the cerebellum.  Her subsequent history is detailed below  INTERVAL HISTORY: Laura Matthews returns today for follow-up of her stage IV breast cancer. She continues to receive radiation, which is causing her some esophagitis. She has been prescribed Carafate for that. She is also on letrozole with no side effects that she is aware of. She has not yet started the palbociclib.  REVIEW OF SYSTEMS:  Laura Matthews is struggling. She has pain of course, and says that the pain medication does work but then the pain comes back. He comes back in different places all the time she says. She denies constipation. She has only mild headaches if any. She has no nausea or vomiting. She has been having problems with eating and swallowing which she relates I think correctly to the recent radiation.  Luckily that is almost completed. She sleeps poorly. She is very fatigued. She short of breath. She has a dry cough. A  detailed review of systems was otherwise stable.  PAST MEDICAL HISTORY: Past Medical History:  Diagnosis Date  . Anemia    low iron  . Arthritis   . Depression    pt denies   . Family history of malignant neoplasm of breast   . Head ache   . Hot flashes   . Lymphedema of breast    left  . Malignant neoplasm of breast (female), unspecified site    left breast  . Mental disorder    depression-  . Newly diagnosed diabetes 11/12/2014   chemo increasing blood glucose; not on meds now  . Radiation 04/22/15-06/23/15   Left Breast    PAST SURGICAL HISTORY: Past Surgical History:  Procedure Laterality Date  . BREAST LUMPECTOMY WITH SENTINEL LYMPH NODE BIOPSY Left 06/11/2014   Procedure: BREAST LUMPECTOMY WITH SENTINEL LYMPH NODE BX, POSSIBLE AXILLARY LYMPH NODE DISSECTION;  Surgeon: Stark Klein, MD;  Location: [place];  Service: General;  Laterality: Left;  . EVACUATION BREAST HEMATOMA Left 07/10/2014   Procedure: EVACUATION OF LEFT BREAST HEMATOMA ;  Surgeon: Stark Klein, MD;  Location: [place];  Service: General;  Laterality: Left;  . FOOT SURGERY  2011   rt  . INCISION AND DRAINAGE Left 07/02/2014   Procedure: Drainage left breast seroma;  Surgeon: Stark Klein, MD;  Location: [place];  Service: General;  Laterality: Left;  . INCISION AND DRAINAGE ABSCESS N/A 11/12/2014   Procedure: ASPIRATION AND INCISION AND DRAINAGE LEFT BREAST ABSCESS;  Surgeon: Michael Boston, MD;  Location: [facility];  Service: General;  Laterality: N/A;  . [place]   right  . PORT-A-CATH REMOVAL Left 07/22/2015   Procedure: REMOVAL PORT-A-CATH;  Surgeon: Stark Klein, MD;  Location: [place];  Service: General;  Laterality: Left;  . PORTACATH PLACEMENT N/A 07/02/2014   Procedure: INSERTION PORT-A-CATH;  Surgeon: Stark Klein, MD;  Location: [place];  Service: General;  Laterality: N/A;  . TUBAL LIGATION    . UTERINE FIBROID SURGERY  2005    ablation    FAMILY HISTORY Family History  Problem Relation Age of Onset  . Breast cancer Mother 60    currently 40; TAH/BSO d/t ?cervical ca at 56  . Lung cancer Father     deceased 35  . Breast cancer Maternal Aunt     dx 14s; currently in her late 44s  . Diabetes Neg Hx    the patient's father died at the age of 56 from lung cancer. The patient's mother was diagnosed with breast cancer at the age of 32. She survives. The patient had 2 brothers, and 2 sisters. One brother may have a diagnosis of "stomach cancer"  GYNECOLOGIC HISTORY:  No LMP recorded. Patient has had an ablation. Menarche age 40 which is also when the patient first had a child. She is GX P3. She stopped having periods after laser ablation in 2005.  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 She has lived with her daughter [name] and her 4 children who are 19, 8, 5, and 4. 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 Currently the patient is living iwith [employer] who works as a Dietitian in [place]. The patient's daughter [name] works as a Scientist, water quality, also in [place].  The patient has 7 grandchildren. She is not a church attender    ADVANCED DIRECTIVES: she has named her daughter Jadene Pierini. [place] as her healthcare power of attorney   HEALTH MAINTENANCE: Social History  Substance Use Topics  . Smoking status: Former Smoker    Packs/day: 0.25    Years: 20.00    Types: Cigarettes  . Smokeless tobacco: Never Used  . Alcohol use No     Comment: Percoset,Vicodin, Oxycotin     Colonoscopy:  PAP:  Bone density:  Lipid panel:  Allergies  Allergen Reactions  . Hydrocodone Itching and Nausea Only    Current Outpatient Prescriptions  Medication Sig Dispense Refill  . benzonatate (TESSALON) 200 MG capsule Take 1 capsule (200 mg total) by mouth 3 (three) times daily. (Patient not taking: Reported on 06/24/2016) 20 capsule 0  .  chlorpheniramine-HYDROcodone (TUSSIONEX) 10-8 MG/5ML SUER Take 5 mLs by mouth every 12 (twelve) hours as needed for cough. (Patient not taking: Reported on 06/24/2016) 140 mL 0  . dexamethasone (DECADRON) 4 MG tablet Take 1 tablet (4 mg total) by mouth every 8 (eight) hours. 120 tablet 0  . feeding supplement, GLUCERNA SHAKE, (GLUCERNA SHAKE) LIQD Take 237 mLs by mouth 3 (three) times daily between meals. 90 Can 0  . glipiZIDE (GLUCOTROL XL) 5 MG 24 hr tablet Take 2 tablets (10 mg total) by mouth daily with breakfast. 120 tablet 12  . letrozole (FEMARA) 2.5 MG tablet Take 1 tablet (2.5 mg total) by mouth daily. 30 tablet 0  . LORazepam (ATIVAN) 0.5 MG tablet Take 1 tablet (0.5 mg total) by mouth every 8 (eight) hours as needed for anxiety. 30 tablet 0  . mirtazapine (REMERON) 30 MG tablet Take 1 tablet (30 mg total) by mouth at bedtime. 30 tablet 3  . naproxen sodium (ALEVE) 220 MG tablet Take 2 tablets (440 mg total) by mouth 3 (three) times daily with meals. 180 tablet 3  . ondansetron (ZOFRAN ODT) 4 MG disintegrating tablet Take 1 tablet (4 mg total) by mouth every 8 (eight) hours as needed for nausea or vomiting. 30 tablet 0  . oxyCODONE (OXYCONTIN) 60 MG 12 hr tablet Take 180 mg by mouth every 12 (twelve) hours. 84 each 0  . oxycodone (ROXICODONE) 30 MG immediate release tablet Take 30 mg by mouth daily.  0  . Oxycodone HCl 10 MG TABS Take 1-2 tablets (10-20 mg total) by mouth every 2 (two) hours as needed. 80 tablet 0  . palbociclib (IBRANCE) 125 MG capsule Take 1 capsule (125 mg total) by mouth daily with breakfast. Take whole with food. 21 capsule 6  . polyethylene glycol (MIRALAX / GLYCOLAX) packet Take 17 g by mouth 2 (two) times daily. 14 each 0  . prochlorperazine (COMPAZINE) 10 MG tablet Take 1 tablet (10 mg total) by mouth every 6 (six) hours as needed for nausea or vomiting. 30 tablet 0  . sucralfate (CARAFATE) 1 g tablet Take 1 tablet (1 g total) by mouth 4 (four) times daily. 120  tablet 2   No current facility-administered medications for this visit.     OBJECTIVE: Middle-aged African American woman Vitals:   06/24/16 1611  BP: (!) 157/86  Pulse: 74  Resp: 19  Temp: 98.8 F (37.1 C)     Body mass index is 21.88 kg/m.    ECOG FS:1 - Symptomatic but completely ambulatory  Sclerae unicteric, pupils round and equal Oropharynx clear and moist-- no thrush or other lesions No cervical or supraclavicular adenopathy  Lungs no rales or rhonchi Heart regular rate and rhythm Abd soft, nontender, positive bowel sounds MSK no focal spinal tenderness to mild percussion, no upper extremity lymphedema Neuro: nonfocal, well oriented, appropriate affect Breasts: Deferred     LAB RESULTS:  CMP     Component Value Date/Time   NA 137 06/08/2016 0549   NA 141 03/02/2015 1316   K 3.6 06/08/2016 0549   K 4.1 03/02/2015 1316   CL 101 06/08/2016 0549   CO2 28 06/08/2016 0549   CO2 27 03/02/2015 1316   GLUCOSE 146 (H) 06/08/2016 0549   GLUCOSE 91 03/02/2015 1316   BUN 8 06/08/2016 0549   BUN 10.4 03/02/2015 1316   CREATININE 0.40 (L) 06/08/2016 0549   CREATININE 0.6 03/02/2015 1316   CALCIUM 8.9 06/08/2016 0549   CALCIUM 9.4 03/02/2015 1316   PROT 6.7 06/08/2016 1324   PROT 7.3 03/02/2015 1316   ALBUMIN 4.1 03/02/2015 1316   AST 18 03/02/2015 1316   ALT 25 03/02/2015 1316   ALKPHOS 68 03/02/2015 1316   BILITOT 0.26 03/02/2015 1316   GFRNONAA >60 06/08/2016 0549   GFRAA >60 06/08/2016 0549    I No results found for: SPEP  Lab Results  Component Value Date   WBC 10.3 06/24/2016   NEUTROABS 9.5 (H) 06/24/2016   HGB 12.7 06/24/2016   HCT 38.2 06/24/2016   MCV 87.0 06/24/2016   PLT 284 06/24/2016      Chemistry      Component Value Date/Time   NA 137 06/08/2016 0549   NA 141 03/02/2015 1316   K 3.6 06/08/2016 0549   K 4.1 03/02/2015 1316   CL 101 06/08/2016 0549   CO2 28 06/08/2016 0549   CO2 27 03/02/2015 1316   BUN 8 06/08/2016 0549   BUN  10.4 03/02/2015 1316   CREATININE 0.40 (L) 06/08/2016 0549   CREATININE 0.6 03/02/2015 1316      Component Value Date/Time   CALCIUM 8.9 06/08/2016 0549   CALCIUM 9.4 03/02/2015 1316   ALKPHOS 68 03/02/2015 1316   AST 18 03/02/2015 1316   ALT 25 03/02/2015 1316   BILITOT 0.26 03/02/2015 1316       Lab Results  Component Value Date   LABCA2 1,356.9 (H) 06/08/2016    No components found for: [id number]  No results for input(s): INR in the last 168 hours.  Urinalysis    Component Value Date/Time   COLORURINE YELLOW 06/03/2014 1050   APPEARANCEUR CLEAR 06/03/2014 1050   LABSPEC >1.030 (H) 06/03/2014 1050   PHURINE 6.0 06/03/2014 1050   GLUCOSEU NEGATIVE 06/03/2014 1050   HGBUR SMALL (A) 06/03/2014 1050   BILIRUBINUR SMALL (A) 06/03/2014 1050   KETONESUR NEGATIVE 06/03/2014 1050   PROTEINUR NEGATIVE 06/03/2014 1050   UROBILINOGEN 0.2 06/03/2014 1050   NITRITE NEGATIVE 06/03/2014 1050   LEUKOCYTESUR TRACE (A) 06/03/2014 1050    STUDIES: Dg Chest 2 View  Result Date: 06/06/2016 CLINICAL DATA:  Acute onset of shortness of breath. Initial encounter. EXAM: CHEST  2 VIEW COMPARISON:  Chest radiograph and CTA of the chest performed 05/19/2016 FINDINGS: The lungs are well-aerated. Vascular congestion is noted. Mild bibasilar opacities may reflect minimal interstitial edema. There is no evidence of pleural effusion or pneumothorax. The heart is mildly enlarged. No acute osseous abnormalities are seen. Scattered clips are seen at the left axilla. IMPRESSION: Vascular congestion and mild cardiomegaly. Mild bibasilar opacities may reflect minimal interstitial edema. Electronically Signed   By: Francoise Schaumann.D.  On: 06/06/2016 23:13   Ct Angio Chest Pe W And/or Wo Contrast  Result Date: 06/07/2016 CLINICAL DATA:  Subacute onset of nonproductive cough, fever and nausea. 21 pounds of weight loss. Generalized back and chest pain. Shortness of breath with exertion. Mild leukocytosis.  Initial encounter. EXAM: CT ANGIOGRAPHY CHEST WITH CONTRAST TECHNIQUE: Multidetector CT imaging of the chest was performed using the standard protocol during bolus administration of intravenous contrast. Multiplanar CT image reconstructions and MIPs were obtained to evaluate the vascular anatomy. CONTRAST:  100 mL of Isovue 300 IV contrast COMPARISON:  CTA of the chest performed 05/19/2016, and chest radiograph performed 06/06/2016 FINDINGS: There is no evidence of pulmonary embolus. Small bilateral pleural effusions are noted. Note is made of vague soft tissue density tracking about the right hilum, measuring approximately 3.7 x 3.7 x 2.0 cm, with mild mass effect on the right-sided pulmonary arteries and right-sided bronchioles. This raises concern for malignancy. Interstitial prominence is noted, with bibasilar airspace opacities, raising concern for pulmonary edema, though underlying pneumonia cannot be excluded. Underlying fissural nodularity on the right side could reflect lymphangitic carcinomatosis, as previously noted. Peripheral scarring is noted. No pneumothorax is identified. Trace pericardial fluid is noted. This may be within normal limits, though malignant pericardial effusion cannot be excluded. No definite mediastinal lymphadenopathy is seen. Vague soft tissue density tracks about the subcarinal region and azygoesophageal recess. The great vessels are grossly unremarkable in appearance. No axillary lymphadenopathy is seen. The visualized portions of the thyroid gland are unremarkable in appearance. There is diffuse skin thickening and irregularity along the left breast. Would correlate for any evidence of inflammatory breast cancer. Postoperative change and a small postoperative fluid collection measuring 2.8 cm are noted underlying the left nipple, with retraction of the left nipple. The visualized portions of the liver and spleen are unremarkable. Numerous lytic lesions are noted throughout the  visualized thoracic and upper lumbar spine, concerning for metastatic disease. A small sclerotic focus is also noted at T5. Review of the MIP images confirms the above findings. IMPRESSION: 1. No evidence of pulmonary embolus. 2. Small bilateral pleural effusions noted. 3. Vague soft tissue density tracking about the right hilum, measuring approximately 3.7 x 3.7 x 2.0 cm, with mild mass effect on the right-sided pulmonary arteries and right-sided bronchioles. This raises concern for malignancy. Underlying right-sided pleural nodularity could reflect lymphangitic carcinomatosis, as previously noted. 4. Interstitial prominence, with bibasilar airspace opacities, concerning for pulmonary edema, though underlying pneumonia cannot be excluded. 5. Trace pericardial fluid may remain within normal limits, though malignant pericardial effusion cannot be excluded. Vague soft tissue density tracks about the subcarinal region and azygoesophageal recess, concerning for extension of malignancy. 6. Diffuse skin thickening and irregularity along the left breast. Would correlate for any evidence of inflammatory breast cancer. Underlying postoperative change and small postoperative fluid collection measuring 2.8 cm, with retraction of the left nipple. 7. Numerous lytic lesions throughout the visualized thoracic and upper lumbar spine, concerning for metastatic disease. Small sclerotic focus also noted at T5. Electronically Signed   By: Garald Balding M.D.   On: 06/07/2016 04:34   [name] JJ Contrast  Result Date: 06/09/2016 CLINICAL DATA:  Metastatic breast cancer. EXAM: MRI HEAD WITHOUT AND WITH CONTRAST TECHNIQUE: Multiplanar, multiecho pulse sequences of the brain and surrounding structures were obtained without and with intravenous contrast. CONTRAST:  28m MULTIHANCE GADOBENATE DIMEGLUMINE 529 MG/ML IV SOLN COMPARISON:  06/02/2014 FINDINGS: No definite acute infarct is identified. There are a few punctate foci  of  scattered trace diffusion signal abnormality in both cerebral hemispheres, most notably in the posterior left frontal lobe and right temporal lobe without clearly restricted diffusion. No intracranial hemorrhage, midline shift, or extra-axial fluid collection is present. The ventricles and sulci are normal in size. There are 12-15 subcentimeter enhancing lesions in the cerebellar hemispheres measuring up to 5 mm in size. Two 1 mm enhancing lesions are questioned in the right parietal lobe (series 11, images 31 and 35, not clearly confirmed on coronal or sagittal sequences). There is a 2 mm enhancing lesion more superiorly and medially in the right parietal lobe (series 11, image 39). A 7 mm enhancing lesion is present in the left frontal lobe (series 11, image 40). There is a 2 mm enhancing lesion in the anterior inferior left temporal lobe (series 12, image 15 and series 13, image 19). A 1 mm lesion is questioned in the posterior left temporal lobe adjacent to the temporal horn (series 12, image 10). Mild motion artifact on postcontrast sequences could potentially obscure additional tiny lesions. There is no significant enhancement associated with any of the lesions. Orbits are unremarkable. Paranasal sinuses and mastoid air cells are clear. Major intracranial vascular flow voids are preserved. No destructive osseous lesion is identified. IMPRESSION: Numerous (15-20) subcentimeter enhancing lesions consistent with metastases and with greatest involvement of the cerebellum. No edema. Motion artifact may obscure additional tiny lesions. Electronically Signed   By: Logan Bores M.D.   On: 06/09/2016 20:54   Dg Chest Port 1 View  Result Date: 06/09/2016 CLINICAL DATA:  Follow-up pleural effusion.  Breast cancer. EXAM: PORTABLE CHEST 1 VIEW COMPARISON:  Chest radiograph from one day prior. FINDINGS: Surgical clips throughout the left axilla and left lateral breast are again noted. Stable cardiomediastinal silhouette  with normal heart size. No pneumothorax. Stable trace bilateral pleural effusions. Hazy and linear opacities throughout both lungs are not appreciably changed. IMPRESSION: 1. No pneumothorax. 2. Stable trace bilateral pleural effusions. 3. Stable hazy and linear opacities throughout both lungs. Given the normal heart size and history of breast cancer, this finding raises concern for lymphangitic tumor as noted on recent chest CT studies. Electronically Signed   By: Ilona Sorrel M.D.   On: 06/09/2016 12:31   Dg Chest Port 1 View  Result Date: 06/08/2016 CLINICAL DATA:  RIGHT pleural effusion post thoracentesis EXAM: PORTABLE CHEST 1 VIEW COMPARISON:  Portable exam 1310 hours compared earlier study of 01/1939 2 hours FINDINGS: Minimal enlargement of cardiac silhouette. Slight pulmonary vascular congestion with persistent pulmonary edema unchanged. Improved aeration RIGHT base. No pneumothorax following thoracentesis. Surgical clips RIGHT axilla. No acute osseous findings. IMPRESSION: No pneumothorax following RIGHT thoracentesis. Persistent pulmonary edema. Electronically Signed   By: Lavonia Dana M.D.   On: 06/08/2016 13:33   Dg Chest Port 1 View  Result Date: 06/08/2016 CLINICAL DATA:  Breast cancer. EXAM: PORTABLE CHEST 1 VIEW COMPARISON:  CT 06/07/2016 .  Chest x-ray 06/06/2016. FINDINGS: Mediastinum stable. Stable cardiomegaly. Persistent diffuse bilateral pulmonary infiltrates and/or edema. No pleural effusion or pneumothorax. Surgical clips left chest wall. IMPRESSION: 1. Persistent bilateral pulmonary infiltrates and or edema. No significant change. 2.  Stable cardiomegaly. Electronically Signed   By: Marcello Moores  Register   On: 06/08/2016 06:40    ASSESSMENT: 50 y.o. [place] woman status post left breast upper outer quadrant biopsy 05/09/2014 for a clinicalT1c N1, stage IIA invasive ductal carcinoma, grade 2, estrogen and progesterone receptor positive, HER-2 nonamplified, with an MIB-1 of 80%  (1)  patient met with genetics counselor 05/29/2014 but decided against genetics testing   (2) tobacco abuse. The patient has been strongly urged to discontinue smoking. She was prescribed a nicotine patch 09/02/2014  (3) status post left lumpectomy and axillary lymph node dissection 06/11/2014 for a pT1c pN3, stage IIIC invasive ductal carcinoma, grade 3, with repeat HER-2 negative  (4) adjuvant chemotherapy consisting of doxorubicin and cyclophosphamide in dose dense fashion x4, completed 09/09/2014, followed by paclitaxel weekly x12 completed 03/09/2015  (5) adjuvant radiation 04/22/15-06/23/15 Left breast/ 45 Gy at 1.8 Gy per fraction x 25 fractions.  Left supraclavicular fossa and axilla/ 45 Gy at 1.8 Gy per fraction x 25 fractions Left breast boost/ 16 Gy at 2 Gy per fraction x 8 fractions  (6) tamoxifen started 07/01/2015, discontinued 06/09/2016 with evidence of disease progression  METASTATIC DISEASE: involving brain, lungs and bone documented August 2017 (7) CT angio 06/06/2016 c/w lymphangitic carcinomatosis and lytic bone lesions                 (a) right thoracentesis 06/08/2016-- cytology confirms adenocarcinoma, estrogen receptor positive                 (b) brain MRI 06/09/2016 shows multiple metastases  (8) starting letrozole 06/09/2016; palbociclib added 06/24/2016  (9) radiation to CNS and spine scheduled 06/13/2016 through 06/27/2016  (10) poorly controlled pain:   (a) adjusted to 180 mg oxycontin BID with OxyIR PRN as of 06/17/2016  (11) denosumab/Xgeva to start September 2017.   PLAN: Vali is doing her best to deal with her metastatic disease but her day does sound chaotic and she is not sure which medication she is taking 4 W. We reviewed the palbociclib today and this is available already at the [facility]. She will pick it up on the way out. She is aware of the possible toxicities, side effects and complications, and we are going to be checking her  counts on a weekly basis initially to make sure she tolerates the initial dose well.  I had a radio refilled her pain medicine so these were not done today. We did review how she should be taking them. She is having transportation problems. I suggested she discuss this with our social workers when she returns early next week to complete her radiation treatments.  Otherwise she will see me again a week from today. We will repeat labs at that time and see how she is tolerating the palbociclib. At that time I will probably set her up for a visit with her first denosumab dose 3 weeks later.  She knows to call for any problems that may develop before then.      Chauncey Cruel, MD 06/24/16

## 2016-06-25 ENCOUNTER — Ambulatory Visit: Payer: Medicaid Other

## 2016-06-25 LAB — CEA: CEA: 17.4 ng/mL — ABNORMAL HIGH (ref 0.0–4.7)

## 2016-06-25 LAB — CANCER ANTIGEN 27.29: CA 27.29: 1762.9 U/mL — ABNORMAL HIGH (ref 0.0–38.6)

## 2016-06-27 ENCOUNTER — Ambulatory Visit: Payer: Medicaid Other

## 2016-06-27 ENCOUNTER — Telehealth: Payer: Self-pay | Admitting: *Deleted

## 2016-06-27 ENCOUNTER — Ambulatory Visit
Admission: RE | Admit: 2016-06-27 | Discharge: 2016-06-27 | Disposition: A | Discharge status: Home / Self Care | Payer: Medicaid Other | Source: Ambulatory Visit | Attending: Radiation Oncology | Admitting: Radiation Oncology

## 2016-06-27 ENCOUNTER — Telehealth: Payer: Self-pay | Admitting: Pharmacist

## 2016-06-27 DIAGNOSIS — Z510 Encounter for antineoplastic radiation therapy: Secondary | ICD-10-CM | POA: Diagnosis not present

## 2016-06-27 LAB — CEA (IN HOUSE-CHCC): CEA (CHCC-IN HOUSE): 13.86 ng/mL — AB (ref 0.00–5.00)

## 2016-06-27 NOTE — Telephone Encounter (Signed)
"  I need to know what rime I'm to be there today and am I supposed to come Tuesday and Wed.?"  Appointment information provided for today instructing her to confirm RT treatment when she arrives.

## 2016-06-27 NOTE — Telephone Encounter (Signed)
Oral Chemotherapy Pharmacist Encounter Contacted patient again for overview of new oral chemotherapy medication: Laura Matthews She reports she has NOT yet had a chance to pick up her medication, but plans to do so today. She had a question about the location of the [place] and I provided her with directions to the pharmacy.  Tried to provide patient with medication education but she again was busy and did not wish to talk now. Told patient if she had questions when she picked up her medication she can ask at the pharmacy.  Will follow up in 2 weeks for adherence and toxicity management.   Thank you,  Nuala Alpha, PharmD [facility] [phone]

## 2016-06-28 ENCOUNTER — Ambulatory Visit: Payer: Medicaid Other

## 2016-06-28 ENCOUNTER — Ambulatory Visit
Admission: RE | Admit: 2016-06-28 | Discharge: 2016-06-28 | Disposition: A | Discharge status: Home / Self Care | Payer: Medicaid Other | Source: Ambulatory Visit | Attending: Radiation Oncology | Admitting: Radiation Oncology

## 2016-06-28 DIAGNOSIS — Z510 Encounter for antineoplastic radiation therapy: Secondary | ICD-10-CM | POA: Diagnosis not present

## 2016-06-29 ENCOUNTER — Telehealth: Payer: Self-pay | Admitting: *Deleted

## 2016-06-29 ENCOUNTER — Encounter: Payer: Self-pay | Admitting: Radiation Oncology

## 2016-06-29 ENCOUNTER — Ambulatory Visit
Admission: RE | Admit: 2016-06-29 | Discharge: 2016-06-29 | Disposition: A | Discharge status: Home / Self Care | Payer: No Typology Code available for payment source | Source: Ambulatory Visit | Attending: Radiation Oncology | Admitting: Radiation Oncology

## 2016-06-29 ENCOUNTER — Ambulatory Visit
Admission: RE | Admit: 2016-06-29 | Discharge: 2016-06-29 | Disposition: A | Discharge status: Home / Self Care | Payer: Medicaid Other | Source: Ambulatory Visit | Attending: Radiation Oncology | Admitting: Radiation Oncology

## 2016-06-29 DIAGNOSIS — Z510 Encounter for antineoplastic radiation therapy: Secondary | ICD-10-CM | POA: Diagnosis not present

## 2016-06-29 NOTE — Progress Notes (Addendum)
Weekly rad txs whole brain and t-lspine, 10/10 completd, patient c/o headache all day takes decadron '4mg'$  bid, and started Ibrance started Monday 06/27/16, , dryness and slight eythema ion scalp, using coconut oil there stated,  Appetite good,  No thrush seen , f/u appt with Shona Simpson, PA 07/26/16 sees Dr. [name] this Friday 07/01/16 per his note 3:14 PM BP 134/82 (BP Location: Right Arm, Patient Position: Sitting, Cuff Size: Normal)   Temp 98.4 F (36.9 C) (Oral)   Resp 20   Wt 153 lb (69.4 kg)   BMI 21.95 kg/m   Wt Readings from Last 3 Encounters:  06/29/16 153 lb (69.4 kg)  06/24/16 153 lb 9.6 oz (69.7 kg)  06/24/16 152 lb 8 oz (69.2 kg)

## 2016-06-29 NOTE — Telephone Encounter (Signed)
Called Dr. [name] nurse line ,  Spoke with Barbaraann Share and stated patient to see Dr,. Magrinat this Friday she stated at noon, and lab before, it isn't scheduled as yet, checked  Dr. Virgie Dad note from 06/24/16 and it does say to come back for appt this Friday, per Barbaraann Share RN, ask patient to stop by their scheduling after seing Dr,. Moody and they will check on that,thanked RN

## 2016-07-01 ENCOUNTER — Telehealth: Payer: Self-pay | Admitting: Oncology

## 2016-07-01 ENCOUNTER — Other Ambulatory Visit (HOSPITAL_BASED_OUTPATIENT_CLINIC_OR_DEPARTMENT_OTHER): Payer: Medicaid Other

## 2016-07-01 ENCOUNTER — Other Ambulatory Visit: Payer: Self-pay | Admitting: *Deleted

## 2016-07-01 ENCOUNTER — Ambulatory Visit (HOSPITAL_BASED_OUTPATIENT_CLINIC_OR_DEPARTMENT_OTHER): Payer: Medicaid Other | Admitting: Oncology

## 2016-07-01 VITALS — BP 139/90 | HR 110 | Temp 98.4°F | Resp 18 | Ht 70.0 in | Wt 153.2 lb

## 2016-07-01 DIAGNOSIS — C50912 Malignant neoplasm of unspecified site of left female breast: Secondary | ICD-10-CM

## 2016-07-01 DIAGNOSIS — Z170 Estrogen receptor positive status [ER+]: Secondary | ICD-10-CM | POA: Diagnosis not present

## 2016-07-01 DIAGNOSIS — C50412 Malignant neoplasm of upper-outer quadrant of left female breast: Secondary | ICD-10-CM

## 2016-07-01 DIAGNOSIS — C7951 Secondary malignant neoplasm of bone: Secondary | ICD-10-CM

## 2016-07-01 DIAGNOSIS — G893 Neoplasm related pain (acute) (chronic): Secondary | ICD-10-CM

## 2016-07-01 DIAGNOSIS — C7802 Secondary malignant neoplasm of left lung: Secondary | ICD-10-CM | POA: Diagnosis not present

## 2016-07-01 DIAGNOSIS — C773 Secondary and unspecified malignant neoplasm of axilla and upper limb lymph nodes: Principal | ICD-10-CM

## 2016-07-01 LAB — CBC WITH DIFFERENTIAL/PLATELET
BASO%: 0.2 % (ref 0.0–2.0)
Basophils Absolute: 0 10*3/uL (ref 0.0–0.1)
EOS ABS: 0 10*3/uL (ref 0.0–0.5)
EOS%: 0 % (ref 0.0–7.0)
HCT: 41.6 % (ref 34.8–46.6)
HGB: 13.4 g/dL (ref 11.6–15.9)
LYMPH%: 2.2 % — AB (ref 14.0–49.7)
MCH: 29 pg (ref 25.1–34.0)
MCHC: 32.3 g/dL (ref 31.5–36.0)
MCV: 89.7 fL (ref 79.5–101.0)
MONO#: 0.1 10*3/uL (ref 0.1–0.9)
MONO%: 1.4 % (ref 0.0–14.0)
NEUT%: 96.2 % — ABNORMAL HIGH (ref 38.4–76.8)
NEUTROS ABS: 8.6 10*3/uL — AB (ref 1.5–6.5)
Platelets: 229 10*3/uL (ref 145–400)
RBC: 4.63 10*6/uL (ref 3.70–5.45)
RDW: 15.9 % — ABNORMAL HIGH (ref 11.2–14.5)
WBC: 8.9 10*3/uL (ref 3.9–10.3)
lymph#: 0.2 10*3/uL — ABNORMAL LOW (ref 0.9–3.3)

## 2016-07-01 LAB — COMPREHENSIVE METABOLIC PANEL
ALT: 17 U/L (ref 0–55)
AST: 15 U/L (ref 5–34)
Albumin: 3.6 g/dL (ref 3.5–5.0)
Alkaline Phosphatase: 129 U/L (ref 40–150)
Anion Gap: 13 mEq/L — ABNORMAL HIGH (ref 3–11)
BUN: 20.5 mg/dL (ref 7.0–26.0)
CHLORIDE: 101 meq/L (ref 98–109)
CO2: 23 meq/L (ref 22–29)
Calcium: 9.2 mg/dL (ref 8.4–10.4)
Creatinine: 1 mg/dL (ref 0.6–1.1)
EGFR: 82 mL/min/{1.73_m2} — AB (ref >90)
GLUCOSE: 336 mg/dL — AB (ref 70–140)
Potassium: 4.4 mEq/L (ref 3.5–5.1)
SODIUM: 136 meq/L (ref 136–145)
TOTAL PROTEIN: 7 g/dL (ref 6.4–8.3)
Total Bilirubin: <0.3 mg/dL (ref 0.20–1.20)

## 2016-07-01 MED ORDER — LORAZEPAM 0.5 MG PO TABS: 0.5000 mg | ORAL_TABLET | Freq: Three times a day (TID) | ORAL | 0 refills | Status: DC | PRN

## 2016-07-01 MED ORDER — OXYCODONE HCL 10 MG PO TABS: 10.0000 mg | ORAL_TABLET | ORAL | 0 refills | Status: DC | PRN

## 2016-07-01 MED ORDER — OXYCODONE HCL ER 60 MG PO T12A: 180.0000 mg | EXTENDED_RELEASE_TABLET | Freq: Two times a day (BID) | ORAL | 0 refills | Status: DC

## 2016-07-01 NOTE — Telephone Encounter (Signed)
appt made and avs printed °

## 2016-07-01 NOTE — Progress Notes (Signed)
[place]  [phone] [phone]) [phone]     ID: [name] DOB: [birth year]  MR#: [id number]  BPZ#:[id number]  Patient Care Team: [name], MD as PCP - General (Family Medicine) Stark Klein, MD as Consulting Physician (General Surgery) Chauncey Cruel, MD as Consulting Physician (Oncology) Thea Silversmith, MD as Consulting Physician (Radiation Oncology) [facility] Bunnie Pion, NP as Nurse Practitioner (Nurse Practitioner) Kyung Rudd, MD as Consulting Physician (Radiation Oncology)  CHIEF COMPLAINT: Stage III left breast cancer  CURRENT TREATMENT: letrozole, palbociclib, denosumab/Xgeva  INTERVAL HISTORY: Deanndra returns today for follow-up of her metastatic estrogen receptor positive breast cancer accompanied by her friend Tammy. Since her last visit here Yajayra completed her radiation treatments to the brain and spine. She did "fine" with those. She is on letrozole daily. She does not have hot flashes. Vaginal dryness is a long-standing problem and not a concern to her at this time. She is also tolerating the Ibrance without any side effects that she is aware of.  REVIEW OF SYSTEMS:  Janine continues to have significant pain, not well controlled on her current medications. However it is hard to know exactly what she is taking. She is not keeping records. She says the pain wakes her up in the middle of the night. She says she goes to bed and pain in wakes up in pain. She also complains of shortness of breath, or appetite, nausea, heartburn, weakness, and forgetfulness. A detailed review of systems today was otherwise stable  BREAST CANCER HISTORY: From the original intake note:  The patient herself palpated a mass in her left breast and brought it to the attention of [facility] NP 04/22/2014. She palpated a large nontender mass at the 4:00 position as well as a small tender mass in the 6:00 position of the left breast. The patient was set up for imaging at  the breast Center where on 05/05/2014 she underwent bilateral diagnostic mammography and left breast ultrasonography. The mammogram showed an irregular mass in the outer left breast with no other suspicious findings. This was firm and fixed at the 3:00 position approximately 10 cm from the nipple. Ultrasound showed a hypoechoic irregular solid mass in this area, measuring 1.8 cm. Ultrasound of the left axilla showed 2 suspicious level I lymph nodes, the largest measuring 1 cm, with a thickened pole. A smaller lymph node measured 0.6 cm but had no visible fatty hilum.  On 05/09/2014 the patient underwent biopsy of the left breast mass (she refused biopsy of the left axilla) which showed ([id number]) and invasive ductal carcinoma, grade 2, estrogen receptor positive at 100%, with strong staining intensity, progesterone receptor 80% positive, with weak staining intensity, with an MIB-1 of 80%, and no HER-2 amplification.  On 05/16/2014 the patient underwent bilateral breast MRI. This showed a 1.7 cm irregular enhancing mass at the 3:00 position of the left breast associated with the biopsy cleft. There were 3 level I left axillary lymph nodes with thickened cortices. The largest measured 1.3 cm. There were no other findings of concern.  Her treatment for stage III disease is detailed below.  METASTATIC DISEASE:  Kaitlan presented to the emergency room 06/06/2016 complaining of shortness of breath and pain after an episode of syncope. CT angiogram of the chest raises suspicion for possible edema, infection, or carcinomatosis. There were also numerous lytic lung lesions. Right pleural fluid cytology collected 06/08/2016 showed ([id number]) adenocarcinoma, estrogen receptor 90% positive, progesterone receptor negative, HER-2 not amplified with a signals  ratio of 1.09 and number per cell 1.85. Brain MRI was obtained 06/09/2016 and showed between 15 and 20 enhancing lesions consistent with metastatic disease,  chiefly involving the cerebellum.  Her subsequent history is detailed below   PAST MEDICAL HISTORY: Past Medical History:  Diagnosis Date  . Anemia    low iron  . Arthritis   . Depression    pt denies   . Family history of malignant neoplasm of breast   . Head ache   . Hot flashes   . Lymphedema of breast    left  . Malignant neoplasm of breast (female), unspecified site    left breast  . Mental disorder    depression-  . Newly diagnosed diabetes 11/12/2014   chemo increasing blood glucose; not on meds now  . Radiation 04/22/15-06/23/15   Left Breast    PAST SURGICAL HISTORY: Past Surgical History:  Procedure Laterality Date  . BREAST LUMPECTOMY WITH SENTINEL LYMPH NODE BIOPSY Left 06/11/2014   Procedure: BREAST LUMPECTOMY WITH SENTINEL LYMPH NODE BX, POSSIBLE AXILLARY LYMPH NODE DISSECTION;  Surgeon: Stark Klein, MD;  Location: [facility];  Service: General;  Laterality: Left;  . EVACUATION BREAST HEMATOMA Left 07/10/2014   Procedure: EVACUATION OF LEFT BREAST HEMATOMA ;  Surgeon: Stark Klein, MD;  Location: [facility];  Service: General;  Laterality: Left;  . FOOT SURGERY  2011   rt  . INCISION AND DRAINAGE Left 07/02/2014   Procedure: Drainage left breast seroma;  Surgeon: Stark Klein, MD;  Location: [facility];  Service: General;  Laterality: Left;  . INCISION AND DRAINAGE ABSCESS N/A 11/12/2014   Procedure: ASPIRATION AND INCISION AND DRAINAGE LEFT BREAST ABSCESS;  Surgeon: Michael Boston, MD;  Location: [facility];  Service: General;  Laterality: N/A;  . [place]   right  . PORT-A-CATH REMOVAL Left 07/22/2015   Procedure: REMOVAL PORT-A-CATH;  Surgeon: Stark Klein, MD;  Location: [facility];  Service: General;  Laterality: Left;  . PORTACATH PLACEMENT N/A 07/02/2014   Procedure: INSERTION PORT-A-CATH;  Surgeon: Stark Klein, MD;  Location: [facility];  Service: General;  Laterality: N/A;  . TUBAL LIGATION    .  UTERINE FIBROID SURGERY  2005   ablation    FAMILY HISTORY Family History  Problem Relation Age of Onset  . Breast cancer Mother 29    currently 41; TAH/BSO d/t ?cervical ca at 66  . Lung cancer Father     deceased 43  . Breast cancer Maternal Aunt     dx 57s; currently in her late 51s  . Diabetes Neg Hx    the patient's father died at the age of 33 from lung cancer. The patient's mother was diagnosed with breast cancer at the age of 34. She survives. The patient had 2 brothers, and 2 sisters. One brother may have a diagnosis of "stomach cancer"  GYNECOLOGIC HISTORY:  No LMP recorded. Patient has had an ablation. Menarche age 42 which is also when the patient first had a child. She is GX P3. She stopped having periods after laser ablation in 2005.  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 She has lived with her daughter [name] and her 4 children who are 33, 8, 5, and 4. 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 Currently the patient is living iOn her own, but sometimes she stays with her son Gilford Raid who works as a  fast food cook in [place]. The patient's daughter [name] works as a Scientist, water quality, also in [place]. The patient has 7 grandchildren. She is not a church attender    ADVANCED DIRECTIVES: she has named her daughter Jadene Pierini. Warsh[place]sky as her healthcare power of attorney   HEALTH MAINTENANCE: Social History  Substance Use Topics  . Smoking status: Former Smoker    Packs/day: 0.25    Years: 20.00    Types: Cigarettes  . Smokeless tobacco: Never Used  . Alcohol use No     Comment: Percoset,Vicodin, Oxycotin     Colonoscopy:  PAP:  Bone density:  Lipid panel:  Allergies  Allergen Reactions  . Hydrocodone Itching and Nausea Only    Current Outpatient Prescriptions  Medication Sig Dispense Refill  . dexamethasone (DECADRON) 4 MG tablet Take 1 tablet (4 mg total) by mouth every 8 (eight) hours.  120 tablet 0  . feeding supplement, GLUCERNA SHAKE, (GLUCERNA SHAKE) LIQD Take 237 mLs by mouth 3 (three) times daily between meals. 90 Can 0  . glipiZIDE (GLUCOTROL XL) 5 MG 24 hr tablet Take 2 tablets (10 mg total) by mouth daily with breakfast. 120 tablet 12  . letrozole (FEMARA) 2.5 MG tablet Take 1 tablet (2.5 mg total) by mouth daily. 30 tablet 0  . LORazepam (ATIVAN) 0.5 MG tablet Take 1 tablet (0.5 mg total) by mouth every 8 (eight) hours as needed for anxiety. 30 tablet 0  . mirtazapine (REMERON) 30 MG tablet Take 1 tablet (30 mg total) by mouth at bedtime. 30 tablet 3  . naproxen sodium (ALEVE) 220 MG tablet Take 2 tablets (440 mg total) by mouth 3 (three) times daily with meals. 180 tablet 3  . ondansetron (ZOFRAN ODT) 4 MG disintegrating tablet Take 1 tablet (4 mg total) by mouth every 8 (eight) hours as needed for nausea or vomiting. 30 tablet 0  . oxyCODONE (OXYCONTIN) 60 MG 12 hr tablet Take 180 mg by mouth every 12 (twelve) hours. 84 each 0  . oxycodone (ROXICODONE) 30 MG immediate release tablet Take 30 mg by mouth daily.  0  . Oxycodone HCl 10 MG TABS Take 1-2 tablets (10-20 mg total) by mouth every 2 (two) hours as needed. 80 tablet 0  . palbociclib (IBRANCE) 125 MG capsule Take 1 capsule (125 mg total) by mouth daily with breakfast. Take whole with food. 21 capsule 6  . polyethylene glycol (MIRALAX / GLYCOLAX) packet Take 17 g by mouth 2 (two) times daily. 14 each 0  . prochlorperazine (COMPAZINE) 10 MG tablet Take 1 tablet (10 mg total) by mouth every 6 (six) hours as needed for nausea or vomiting. 30 tablet 0  . sucralfate (CARAFATE) 1 g tablet Take 1 tablet (1 g total) by mouth 4 (four) times daily. 120 tablet 2   No current facility-administered medications for this visit.     OBJECTIVE: Middle-aged [place] American woman Who appears stated age 42:   07/01/16 1240  BP: 139/90  Pulse: (!) 110  Resp: 18  Temp: 98.4 F (36.9 C)     Body mass index is 21.98 kg/m.     ECOG FS:1 - Symptomatic but completely ambulatory  Sclerae unicteric, pupils round and equal Oropharynx clear and moist-- no thrush or other lesions No cervical or supraclavicular adenopathy Lungs no rales or rhonchi Heart regular rate and rhythm Abd soft, nontender, positive bowel sounds MSK no focal spinal tenderness Neuro: nonfocal, well oriented, scattered affect Breasts: Deferred     LAB RESULTS:  CMP  Component Value Date/Time   NA 136 07/01/2016 1223   K 4.4 07/01/2016 1223   CL 101 06/08/2016 0549   CO2 23 07/01/2016 1223   GLUCOSE 336 (H) 07/01/2016 1223   BUN 20.5 07/01/2016 1223   CREATININE 1.0 07/01/2016 1223   CALCIUM 9.2 07/01/2016 1223   PROT 7.0 07/01/2016 1223   ALBUMIN 3.6 07/01/2016 1223   AST 15 07/01/2016 1223   ALT 17 07/01/2016 1223   ALKPHOS 129 07/01/2016 1223   BILITOT <0.30 07/01/2016 1223   GFRNONAA >60 06/08/2016 0549   GFRAA >60 06/08/2016 0549    I No results found for: SPEP  Lab Results  Component Value Date   WBC 8.9 07/01/2016   NEUTROABS 8.6 (H) 07/01/2016   HGB 13.4 07/01/2016   HCT 41.6 07/01/2016   MCV 89.7 07/01/2016   PLT 229 07/01/2016      Chemistry      Component Value Date/Time   NA 136 07/01/2016 1223   K 4.4 07/01/2016 1223   CL 101 06/08/2016 0549   CO2 23 07/01/2016 1223   BUN 20.5 07/01/2016 1223   CREATININE 1.0 07/01/2016 1223      Component Value Date/Time   CALCIUM 9.2 07/01/2016 1223   ALKPHOS 129 07/01/2016 1223   AST 15 07/01/2016 1223   ALT 17 07/01/2016 1223   BILITOT <0.30 07/01/2016 1223       Lab Results  Component Value Date   LABCA2 1,356.9 (H) 06/08/2016    No components found for: LNLGX211  No results for input(s): INR in the last 168 hours.  Urinalysis    Component Value Date/Time   COLORURINE YELLOW 06/03/2014 1050   APPEARANCEUR CLEAR 06/03/2014 1050   LABSPEC >1.030 (H) 06/03/2014 1050   PHURINE 6.0 06/03/2014 1050   GLUCOSEU NEGATIVE 06/03/2014 1050    HGBUR SMALL (A) 06/03/2014 1050   BILIRUBINUR SMALL (A) 06/03/2014 1050   KETONESUR NEGATIVE 06/03/2014 1050   PROTEINUR NEGATIVE 06/03/2014 1050   UROBILINOGEN 0.2 06/03/2014 1050   NITRITE NEGATIVE 06/03/2014 1050   LEUKOCYTESUR TRACE (A) 06/03/2014 1050    STUDIES: Dg Chest 2 View  Result Date: 06/06/2016 CLINICAL DATA:  Acute onset of shortness of breath. Initial encounter. EXAM: CHEST  2 VIEW COMPARISON:  Chest radiograph and CTA of the chest performed 05/19/2016 FINDINGS: The lungs are well-aerated. Vascular congestion is noted. Mild bibasilar opacities may reflect minimal interstitial edema. There is no evidence of pleural effusion or pneumothorax. The heart is mildly enlarged. No acute osseous abnormalities are seen. Scattered clips are seen at the left axilla. IMPRESSION: Vascular congestion and mild cardiomegaly. Mild bibasilar opacities may reflect minimal interstitial edema. Electronically Signed   By: Garald Balding M.D.   On: 06/06/2016 23:13   Ct Angio Chest Pe W And/or Wo Contrast  Result Date: 06/07/2016 CLINICAL DATA:  Subacute onset of nonproductive cough, fever and nausea. 21 pounds of weight loss. Generalized back and chest pain. Shortness of breath with exertion. Mild leukocytosis. Initial encounter. EXAM: CT ANGIOGRAPHY CHEST WITH CONTRAST TECHNIQUE: Multidetector CT imaging of the chest was performed using the standard protocol during bolus administration of intravenous contrast. Multiplanar CT image reconstructions and MIPs were obtained to evaluate the vascular anatomy. CONTRAST:  100 mL of Isovue 300 IV contrast COMPARISON:  CTA of the chest performed 05/19/2016, and chest radiograph performed 06/06/2016 FINDINGS: There is no evidence of pulmonary embolus. Small bilateral pleural effusions are noted. Note is made of vague soft tissue density tracking about the right hilum,  measuring approximately 3.7 x 3.7 x 2.0 cm, with mild mass effect on the right-sided pulmonary arteries  and right-sided bronchioles. This raises concern for malignancy. Interstitial prominence is noted, with bibasilar airspace opacities, raising concern for pulmonary edema, though underlying pneumonia cannot be excluded. Underlying fissural nodularity on the right side could reflect lymphangitic carcinomatosis, as previously noted. Peripheral scarring is noted. No pneumothorax is identified. Trace pericardial fluid is noted. This may be within normal limits, though malignant pericardial effusion cannot be excluded. No definite mediastinal lymphadenopathy is seen. Vague soft tissue density tracks about the subcarinal region and azygoesophageal recess. The great vessels are grossly unremarkable in appearance. No axillary lymphadenopathy is seen. The visualized portions of the thyroid gland are unremarkable in appearance. There is diffuse skin thickening and irregularity along the left breast. Would correlate for any evidence of inflammatory breast cancer. Postoperative change and a small postoperative fluid collection measuring 2.8 cm are noted underlying the left nipple, with retraction of the left nipple. The visualized portions of the liver and spleen are unremarkable. Numerous lytic lesions are noted throughout the visualized thoracic and upper lumbar spine, concerning for metastatic disease. A small sclerotic focus is also noted at T5. Review of the MIP images confirms the above findings. IMPRESSION: 1. No evidence of pulmonary embolus. 2. Small bilateral pleural effusions noted. 3. Vague soft tissue density tracking about the right hilum, measuring approximately 3.7 x 3.7 x 2.0 cm, with mild mass effect on the right-sided pulmonary arteries and right-sided bronchioles. This raises concern for malignancy. Underlying right-sided pleural nodularity could reflect lymphangitic carcinomatosis, as previously noted. 4. Interstitial prominence, with bibasilar airspace opacities, concerning for pulmonary edema, though  underlying pneumonia cannot be excluded. 5. Trace pericardial fluid may remain within normal limits, though malignant pericardial effusion cannot be excluded. Vague soft tissue density tracks about the subcarinal region and azygoesophageal recess, concerning for extension of malignancy. 6. Diffuse skin thickening and irregularity along the left breast. Would correlate for any evidence of inflammatory breast cancer. Underlying postoperative change and small postoperative fluid collection measuring 2.8 cm, with retraction of the left nipple. 7. Numerous lytic lesions throughout the visualized thoracic and upper lumbar spine, concerning for metastatic disease. Small sclerotic focus also noted at T5. Electronically Signed   By: Garald Balding M.D.   On: 06/07/2016 04:34   [name] QQ Contrast  Result Date: 06/09/2016 CLINICAL DATA:  Metastatic breast cancer. EXAM: MRI HEAD WITHOUT AND WITH CONTRAST TECHNIQUE: Multiplanar, multiecho pulse sequences of the brain and surrounding structures were obtained without and with intravenous contrast. CONTRAST:  53m MULTIHANCE GADOBENATE DIMEGLUMINE 529 MG/ML IV SOLN COMPARISON:  06/02/2014 FINDINGS: No definite acute infarct is identified. There are a few punctate foci of scattered trace diffusion signal abnormality in both cerebral hemispheres, most notably in the posterior left frontal lobe and right temporal lobe without clearly restricted diffusion. No intracranial hemorrhage, midline shift, or extra-axial fluid collection is present. The ventricles and sulci are normal in size. There are 12-15 subcentimeter enhancing lesions in the cerebellar hemispheres measuring up to 5 mm in size. Two 1 mm enhancing lesions are questioned in the right parietal lobe (series 11, images 31 and 35, not clearly confirmed on coronal or sagittal sequences). There is a 2 mm enhancing lesion more superiorly and medially in the right parietal lobe (series 11, image 39). A 7 mm enhancing lesion  is present in the left frontal lobe (series 11, image 40). There is a 2 mm enhancing lesion in the  anterior inferior left temporal lobe (series 12, image 15 and series 13, image 19). A 1 mm lesion is questioned in the posterior left temporal lobe adjacent to the temporal horn (series 12, image 10). Mild motion artifact on postcontrast sequences could potentially obscure additional tiny lesions. There is no significant enhancement associated with any of the lesions. Orbits are unremarkable. Paranasal sinuses and mastoid air cells are clear. Major intracranial vascular flow voids are preserved. No destructive osseous lesion is identified. IMPRESSION: Numerous (15-20) subcentimeter enhancing lesions consistent with metastases and with greatest involvement of the cerebellum. No edema. Motion artifact may obscure additional tiny lesions. Electronically Signed   By: Logan Bores M.D.   On: 06/09/2016 20:54   Dg Chest Port 1 View  Result Date: 06/09/2016 CLINICAL DATA:  Follow-up pleural effusion.  Breast cancer. EXAM: PORTABLE CHEST 1 VIEW COMPARISON:  Chest radiograph from one day prior. FINDINGS: Surgical clips throughout the left axilla and left lateral breast are again noted. Stable cardiomediastinal silhouette with normal heart size. No pneumothorax. Stable trace bilateral pleural effusions. Hazy and linear opacities throughout both lungs are not appreciably changed. IMPRESSION: 1. No pneumothorax. 2. Stable trace bilateral pleural effusions. 3. Stable hazy and linear opacities throughout both lungs. Given the normal heart size and history of breast cancer, this finding raises concern for lymphangitic tumor as noted on recent chest CT studies. Electronically Signed   By: Ilona Sorrel M.D.   On: 06/09/2016 12:31   Dg Chest Port 1 View  Result Date: 06/08/2016 CLINICAL DATA:  RIGHT pleural effusion post thoracentesis EXAM: PORTABLE CHEST 1 VIEW COMPARISON:  Portable exam 1310 hours compared earlier study of  01/1939 2 hours FINDINGS: Minimal enlargement of cardiac silhouette. Slight pulmonary vascular congestion with persistent pulmonary edema unchanged. Improved aeration RIGHT base. No pneumothorax following thoracentesis. Surgical clips RIGHT axilla. No acute osseous findings. IMPRESSION: No pneumothorax following RIGHT thoracentesis. Persistent pulmonary edema. Electronically Signed   By: Lavonia Dana M.D.   On: 06/08/2016 13:33   Dg Chest Port 1 View  Result Date: 06/08/2016 CLINICAL DATA:  Breast cancer. EXAM: PORTABLE CHEST 1 VIEW COMPARISON:  CT 06/07/2016 .  Chest x-ray 06/06/2016. FINDINGS: Mediastinum stable. Stable cardiomegaly. Persistent diffuse bilateral pulmonary infiltrates and/or edema. No pleural effusion or pneumothorax. Surgical clips left chest wall. IMPRESSION: 1. Persistent bilateral pulmonary infiltrates and or edema. No significant change. 2.  Stable cardiomegaly. Electronically Signed   By: Marcello Moores  Register   On: 06/08/2016 06:40    ASSESSMENT: 50 y.o. [place] woman status post left breast upper outer quadrant biopsy 05/09/2014 for a clinicalT1c N1, stage IIA invasive ductal carcinoma, grade 2, estrogen and progesterone receptor positive, HER-2 nonamplified, with an MIB-1 of 80%  (1) patient met with genetics counselor 05/29/2014 but decided against genetics testing   (2) tobacco abuse. The patient has been strongly urged to discontinue smoking. She was prescribed a nicotine patch 09/02/2014  (3) status post left lumpectomy and axillary lymph node dissection 06/11/2014 for a pT1c pN3, stage IIIC invasive ductal carcinoma, grade 3, with repeat HER-2 negative  (4) adjuvant chemotherapy consisting of doxorubicin and cyclophosphamide in dose dense fashion x4, completed 09/09/2014, followed by paclitaxel weekly x12 completed 03/09/2015  (5) adjuvant radiation 04/22/15-06/23/15 Left breast/ 45 Gy at 1.8 Gy per fraction x 25 fractions.  Left supraclavicular fossa and axilla/ 45 Gy  at 1.8 Gy per fraction x 25 fractions Left breast boost/ 16 Gy at 2 Gy per fraction x 8 fractions  (6) tamoxifen started 07/01/2015, discontinued  06/09/2016 with evidence of disease progression  METASTATIC DISEASE: involving brain, lungs and bone documented August 2017 (7) CT angio 06/06/2016 c/w lymphangitic carcinomatosis and lytic bone lesions                 (a) right thoracentesis 06/08/2016-- cytology confirms adenocarcinoma, estrogen receptor positive                 (b) brain MRI 06/09/2016 shows multiple metastases   (c) CA 27-29 is informative  (8) starting letrozole 06/09/2016; palbociclib added 06/24/2016  (9) radiation to CNS and spine in 10 fractions 06/13/2016 through 06/27/2016  (10) poorly controlled pain:   (a) adjusted to 180 mg oxycontin BID with OxyIR PRN as of 06/17/2016  (11) denosumab/Xgeva to start 07/11/2016.   PLAN: Raevyn is tolerating her letrozole and palbociclib without any unusual side effects. She will be started on denosumab at her next visit here.  She shows a poor understanding of her situation. She seemed to be surprised to learn that the reason she is hurting is that she has cancer in her bones. She again went back to her prior mammogram in March, which showed a small area of calcification, and wonders if they "just ignored it and let my cancer keep him growing". We had previously discussed this was she was in the hospital. She also called her friend Tammy by different name today.  It is not surprising that Magally would be somewhat confused at this point. This is a real problem with regards to her medications however. I wrote down for her the fact that she absolutely must take her letrozole every day and she must take the palbociclib every day until she runs out. She did not remember that the palbociclib is 21 days on and 7 days off and this was also written for her.  I refilled her short acting medications. She did not realize that she was also  going to be running out of her long-acting pain medications and we went ahead and refill that.  I wrote her edification list explaining what each of her medications is 4 and i asked her to make sure and correlate this with what she actually has at home. i had asked her to bring all her medications today but she only brought to our 3 of them  My concern is that Heidi may not be able to understand her medications, take them appropriately, or make sure that she does not run out before the weekend. She is now back living on her own because she says her grandchildren whom she lives very much more to noisy. Her friend Lynelle Smoke will be staying with her an additional couple of days and openly that will help.  Tye [place] wanted to know how long she was going to live. We went over the fact that we are going to treat the disease outside the CNS with HER-2 pills, which can be very effective. This central nervous system is more of a problem and she has just completed her radiation. She will need a repeat MRI in a couple of months, but if there is widespread recurrence there there may be little we can do.  She will see me again 07/11/2016. She will have her first denosumab dose at that time.     Chauncey Cruel, MD 07/01/16

## 2016-07-08 ENCOUNTER — Emergency Department (HOSPITAL_COMMUNITY)
Admission: EM | Admit: 2016-07-08 | Discharge: 2016-07-09 | Disposition: A | Discharge status: Home / Self Care | Payer: Medicaid Other | Attending: Emergency Medicine | Admitting: Emergency Medicine

## 2016-07-08 DIAGNOSIS — Z85118 Personal history of other malignant neoplasm of bronchus and lung: Secondary | ICD-10-CM | POA: Insufficient documentation

## 2016-07-08 DIAGNOSIS — Z87891 Personal history of nicotine dependence: Secondary | ICD-10-CM | POA: Diagnosis not present

## 2016-07-08 DIAGNOSIS — E119 Type 2 diabetes mellitus without complications: Secondary | ICD-10-CM | POA: Insufficient documentation

## 2016-07-08 DIAGNOSIS — M2550 Pain in unspecified joint: Secondary | ICD-10-CM | POA: Diagnosis not present

## 2016-07-08 DIAGNOSIS — Z853 Personal history of malignant neoplasm of breast: Secondary | ICD-10-CM | POA: Insufficient documentation

## 2016-07-08 DIAGNOSIS — R000 Tachycardia, unspecified: Secondary | ICD-10-CM | POA: Diagnosis not present

## 2016-07-08 DIAGNOSIS — R600 Localized edema: Secondary | ICD-10-CM | POA: Diagnosis not present

## 2016-07-08 DIAGNOSIS — R0789 Other chest pain: Secondary | ICD-10-CM | POA: Insufficient documentation

## 2016-07-08 DIAGNOSIS — Z79899 Other long term (current) drug therapy: Secondary | ICD-10-CM | POA: Diagnosis not present

## 2016-07-08 DIAGNOSIS — R112 Nausea with vomiting, unspecified: Secondary | ICD-10-CM | POA: Insufficient documentation

## 2016-07-08 DIAGNOSIS — C50919 Malignant neoplasm of unspecified site of unspecified female breast: Secondary | ICD-10-CM

## 2016-07-08 DIAGNOSIS — R0602 Shortness of breath: Secondary | ICD-10-CM | POA: Diagnosis present

## 2016-07-08 MED ORDER — ALBUTEROL SULFATE (2.5 MG/3ML) 0.083% IN NEBU
5.0000 mg | INHALATION_SOLUTION | Freq: Once | RESPIRATORY_TRACT | Status: DC
  Filled 2016-07-08: qty 6

## 2016-07-08 NOTE — Progress Notes (Signed)
  Radiation Oncology         [phone] ________________________________  Name: Laura Matthews MRN: [id number]  Date: 06/29/2016  DOB: 18-Nov-1965  End of Treatment Note  Diagnosis:   Metastatic cancer with brain and bony metastasis secondary to stage IV breast cancer     Indication for treatment::  palliative       Radiation treatment dates:   06/13/2016 through 06/29/2016  Site/dose:   The patient was treated with a course of whole brain radiation treatment to a dose of 30 gray in 10 fractions. She also concurrently received treatment to the spine corresponding to the T10-L5 vertebral bodies. This also received 30 gray in 10 fractions. The patient received 11 fractions in total because one day she was only able to receive whole brain radiation treatment. 10 fractions were delivered to each target sites independently.  Narrative: The patient tolerated radiation treatment relatively well.     Plan: The patient has completed radiation treatment. The patient will return to radiation oncology clinic for routine followup in one month. I advised the patient to call or return sooner if they have any questions or concerns related to their recovery or treatment. ________________________________  Jodelle Gross, M.D., Ph.D.

## 2016-07-09 ENCOUNTER — Emergency Department (HOSPITAL_COMMUNITY): Payer: Medicaid Other

## 2016-07-09 ENCOUNTER — Other Ambulatory Visit: Payer: Self-pay

## 2016-07-09 ENCOUNTER — Encounter (HOSPITAL_COMMUNITY): Payer: Self-pay | Admitting: Emergency Medicine

## 2016-07-09 LAB — CBC WITH DIFFERENTIAL/PLATELET
BASOS ABS: 0 10*3/uL (ref 0.0–0.1)
BASOS PCT: 0 %
Eosinophils Absolute: 0 10*3/uL (ref 0.0–0.7)
Eosinophils Relative: 0 %
HEMATOCRIT: 35.8 % — AB (ref 36.0–46.0)
HEMOGLOBIN: 11.9 g/dL — AB (ref 12.0–15.0)
Lymphocytes Relative: 11 %
Lymphs Abs: 0.4 10*3/uL — ABNORMAL LOW (ref 0.7–4.0)
MCH: 29.3 pg (ref 26.0–34.0)
MCHC: 33.2 g/dL (ref 30.0–36.0)
MCV: 88.2 fL (ref 78.0–100.0)
MONOS PCT: 4 %
Monocytes Absolute: 0.1 10*3/uL (ref 0.1–1.0)
NEUTROS ABS: 3 10*3/uL (ref 1.7–7.7)
NEUTROS PCT: 85 %
Platelets: 169 10*3/uL (ref 150–400)
RBC: 4.06 MIL/uL (ref 3.87–5.11)
RDW: 16.6 % — ABNORMAL HIGH (ref 11.5–15.5)
WBC: 3.6 10*3/uL — ABNORMAL LOW (ref 4.0–10.5)

## 2016-07-09 LAB — URINALYSIS, ROUTINE W REFLEX MICROSCOPIC
BILIRUBIN URINE: NEGATIVE
Glucose, UA: NEGATIVE mg/dL
HGB URINE DIPSTICK: NEGATIVE
KETONES UR: NEGATIVE mg/dL
Leukocytes, UA: NEGATIVE
Nitrite: NEGATIVE
PH: 6.5 (ref 5.0–8.0)
Protein, ur: NEGATIVE mg/dL
SPECIFIC GRAVITY, URINE: 1.026 (ref 1.005–1.030)

## 2016-07-09 LAB — COMPREHENSIVE METABOLIC PANEL
ALBUMIN: 3.8 g/dL (ref 3.5–5.0)
ALK PHOS: 95 U/L (ref 38–126)
ALT: 27 U/L (ref 14–54)
AST: 21 U/L (ref 15–41)
Anion gap: 8 (ref 5–15)
BILIRUBIN TOTAL: 0.2 mg/dL — AB (ref 0.3–1.2)
BUN: 13 mg/dL (ref 6–20)
CALCIUM: 9.1 mg/dL (ref 8.9–10.3)
CO2: 31 mmol/L (ref 22–32)
CREATININE: 0.5 mg/dL (ref 0.44–1.00)
Chloride: 99 mmol/L — ABNORMAL LOW (ref 101–111)
GFR calc non Af Amer: >60 mL/min (ref >60)
Glucose, Bld: 108 mg/dL — ABNORMAL HIGH (ref 65–99)
Potassium: 4 mmol/L (ref 3.5–5.1)
Sodium: 138 mmol/L (ref 135–145)
TOTAL PROTEIN: 6.9 g/dL (ref 6.5–8.1)

## 2016-07-09 LAB — I-STAT TROPONIN, ED: TROPONIN I, POC: 0.02 ng/mL (ref 0.00–0.08)

## 2016-07-09 LAB — BRAIN NATRIURETIC PEPTIDE: B Natriuretic Peptide: 21.9 pg/mL (ref 0.0–100.0)

## 2016-07-09 MED ORDER — HYDROMORPHONE HCL 1 MG/ML IJ SOLN
1.0000 mg | Freq: Once | INTRAMUSCULAR | Status: DC
  Filled 2016-07-09: qty 1

## 2016-07-09 MED ORDER — HYDROMORPHONE HCL 2 MG/ML IJ SOLN
2.0000 mg | Freq: Once | INTRAMUSCULAR | Status: AC
  Administered 2016-07-09: 2 mg via INTRAVENOUS
  Filled 2016-07-09: qty 1

## 2016-07-09 MED ORDER — HYDROMORPHONE HCL 1 MG/ML IJ SOLN
1.0000 mg | Freq: Once | INTRAMUSCULAR | Status: AC
  Administered 2016-07-09: 1 mg via INTRAVENOUS
  Filled 2016-07-09: qty 1

## 2016-07-09 MED ORDER — ONDANSETRON HCL 4 MG/2ML IJ SOLN
4.0000 mg | Freq: Once | INTRAMUSCULAR | Status: AC
  Administered 2016-07-09: 4 mg via INTRAVENOUS
  Filled 2016-07-09: qty 2

## 2016-07-09 MED ORDER — IOPAMIDOL (ISOVUE-370) INJECTION 76%
100.0000 mL | Freq: Once | INTRAVENOUS | Status: AC | PRN
  Administered 2016-07-09: 100 mL via INTRAVENOUS

## 2016-07-09 NOTE — ED Provider Notes (Signed)
[facility] DEPT Provider Note   CSN: [id number] Arrival date & time: 07/08/16  2352 By signing my name below, I, Georgette Shell, attest that this documentation has been prepared under the direction and in the presence of Orpah Greek, MD. Electronically Signed: Georgette Shell, ED Scribe. 07/09/16. 12:15 AM.  History   Chief Complaint Chief Complaint  Patient presents with  . Shortness of Breath   HPI Comments: Laura Matthews is a 50 y.o. female with h/o lung cancer and breast cancer who presents to the Emergency Department complaining of sudden onset, intermittent shortness of breath onset 4 am one day ago. Pt states it woke her up out of her sleep. Pt also has associated nausea, vomiting, and generalized arthralgias. She rates her pain this time a 10/10. Pt took her prescribed Oxycontin which usually helps the pain but notes that it did not this time. Pt is currently being treated for lung cancer. She has been compliant with all of her medications. Pt has no other complaints at this time.   The history is provided by the patient. No language interpreter was used.    Past Medical History:  Diagnosis Date  . Anemia    low iron  . Arthritis   . Depression    pt denies   . Family history of malignant neoplasm of breast   . Head ache   . Hot flashes   . Lymphedema of breast    left  . Malignant neoplasm of breast (female), unspecified site    left breast  . Mental disorder    depression-  . Newly diagnosed diabetes 11/12/2014   chemo increasing blood glucose; not on meds now  . Radiation 04/22/15-06/23/15   Left Breast    Patient Active Problem List   Diagnosis Date Noted  . Cancer, metastatic to bone ([place]) 06/10/2016  . Brain metastasis ([place])   . Protein-calorie malnutrition, severe 06/09/2016  . Pleural effusion   . S/P thoracentesis   . Acute respiratory failure with hypoxia ([place]) 06/07/2016  . Back pain 06/07/2016  . Protein-calorie malnutrition, moderate ([place])  06/07/2016  . Cough   . Hypoxia   . Lung metastases ([place])   . Shortness of breath   . Bronchitis 05/25/2016  . Chest pain 02/02/2015  . Depression 01/26/2015  . Abscess of left breast 11/12/2014  . Diabetes mellitus without complication ([place]) 40/81/4481  . Neutropenia ([place]) 08/05/2014  . Lymphedema of breast 08/05/2014  . Breast cancer metastasized to axillary lymph node ([place]) 06/11/2014  . Family history of malignant neoplasm of breast   . Breast cancer of upper-outer quadrant of left female breast ([place]) 05/19/2014  . Opiate dependence ([place]) 05/01/2012    Class: Acute  . Homeless 05/01/2012    Class: Acute    Past Surgical History:  Procedure Laterality Date  . BREAST LUMPECTOMY WITH SENTINEL LYMPH NODE BIOPSY Left 06/11/2014   Procedure: BREAST LUMPECTOMY WITH SENTINEL LYMPH NODE BX, POSSIBLE AXILLARY LYMPH NODE DISSECTION;  Surgeon: Stark Klein, MD;  Location: [facility];  Service: General;  Laterality: Left;  . EVACUATION BREAST HEMATOMA Left 07/10/2014   Procedure: EVACUATION OF LEFT BREAST HEMATOMA ;  Surgeon: Stark Klein, MD;  Location: [facility];  Service: General;  Laterality: Left;  . FOOT SURGERY  2011   rt  . INCISION AND DRAINAGE Left 07/02/2014   Procedure: Drainage left breast seroma;  Surgeon: Stark Klein, MD;  Location: [facility];  Service: General;  Laterality: Left;  . INCISION  AND DRAINAGE ABSCESS N/A 11/12/2014   Procedure: ASPIRATION AND INCISION AND DRAINAGE LEFT BREAST ABSCESS;  Surgeon: Michael Boston, MD;  Location: [facility];  Service: General;  Laterality: N/A;  . [place]   right  . PORT-A-CATH REMOVAL Left 07/22/2015   Procedure: REMOVAL PORT-A-CATH;  Surgeon: Stark Klein, MD;  Location: [facility];  Service: General;  Laterality: Left;  . PORTACATH PLACEMENT N/A 07/02/2014   Procedure: INSERTION PORT-A-CATH;  Surgeon: Stark Klein, MD;  Location: [facility];  Service: General;  Laterality:  N/A;  . TUBAL LIGATION    . UTERINE FIBROID SURGERY  2005   ablation    OB History    Gravida Para Term Preterm AB Living   '3 3 3     3   '$ SAB TAB Ectopic Multiple Live Births           2       Home Medications    Prior to Admission medications   Medication Sig Start Date End Date Taking? Authorizing Provider  dexamethasone (DECADRON) 4 MG tablet Take 1 tablet (4 mg total) by mouth every 8 (eight) hours. 06/12/16   Debbe Odea, MD  feeding supplement, GLUCERNA SHAKE, (GLUCERNA SHAKE) LIQD Take 237 mLs by mouth 3 (three) times daily between meals. 06/12/16   Debbe Odea, MD  glipiZIDE (GLUCOTROL XL) 5 MG 24 hr tablet Take 2 tablets (10 mg total) by mouth daily with breakfast. 06/12/16   Debbe Odea, MD  letrozole (FEMARA) 2.5 MG tablet Take 1 tablet (2.5 mg total) by mouth daily. 06/12/16   Debbe Odea, MD  LORazepam (ATIVAN) 0.5 MG tablet Take 1 tablet (0.5 mg total) by mouth every 8 (eight) hours as needed for anxiety. 07/01/16   Chauncey Cruel, MD  mirtazapine (REMERON) 30 MG tablet Take 1 tablet (30 mg total) by mouth at bedtime. 06/17/16   Chauncey Cruel, MD  naproxen sodium (ALEVE) 220 MG tablet Take 2 tablets (440 mg total) by mouth 3 (three) times daily with meals. 06/17/16   Chauncey Cruel, MD  ondansetron (ZOFRAN ODT) 4 MG disintegrating tablet Take 1 tablet (4 mg total) by mouth every 8 (eight) hours as needed for nausea or vomiting. 06/12/16   Debbe Odea, MD  oxyCODONE (OXYCONTIN) 60 MG 12 hr tablet Take 180 mg by mouth every 12 (twelve) hours. 07/01/16   Chauncey Cruel, MD  oxycodone (ROXICODONE) 30 MG immediate release tablet Take 30 mg by mouth daily. 06/20/16   Historical Provider, MD  Oxycodone HCl 10 MG TABS Take 1-2 tablets (10-20 mg total) by mouth every 2 (two) hours as needed. 07/01/16   Chauncey Cruel, MD  palbociclib [facility]) 125 MG capsule Take 1 capsule (125 mg total) by mouth daily with breakfast. Take whole with food. 06/17/16   Chauncey Cruel, MD    polyethylene glycol [facility] / Floria [facility]) packet Take 17 g by mouth 2 (two) times daily. 06/12/16   Debbe Odea, MD  prochlorperazine (COMPAZINE) 10 MG tablet Take 1 tablet (10 mg total) by mouth every 6 (six) hours as needed for nausea or vomiting. 06/24/16   Kyung Rudd, MD  sucralfate (CARAFATE) 1 g tablet Take 1 tablet (1 g total) by mouth 4 (four) times daily. 06/24/16   Kyung Rudd, MD    Family History Family History  Problem Relation Age of Onset  . Breast cancer Mother 52    currently 63; TAH/BSO d/t ?cervical ca at 58  . Lung  cancer Father     deceased 79  . Breast cancer Maternal Aunt     dx 29s; currently in her late 68s  . Diabetes Neg Hx     Social History Social History  Substance Use Topics  . Smoking status: Former Smoker    Packs/day: 0.25    Years: 20.00    Types: Cigarettes  . Smokeless tobacco: Never Used  . Alcohol use No     Comment: Percoset,Vicodin, Oxycotin     Allergies   Hydrocodone   Review of Systems Review of Systems  Respiratory: Positive for shortness of breath.   Gastrointestinal: Positive for nausea and vomiting.  Musculoskeletal: Positive for arthralgias.  All other systems reviewed and are negative.    Physical Exam Updated Vital Signs BP 150/96 (BP Location: Left Arm)   Pulse 103   Temp 98.6 F (37 C) (Oral)   Resp 18   SpO2 95%   Physical Exam  Constitutional: She is oriented to person, place, and time. She appears well-developed and well-nourished. No distress.  HENT:  Head: Normocephalic and atraumatic.  Right Ear: Hearing normal.  Left Ear: Hearing normal.  Nose: Nose normal.  Mouth/Throat: Oropharynx is clear and moist and mucous membranes are normal.  Eyes: Conjunctivae and EOM are normal. Pupils are equal, round, and reactive to light.  Neck: Normal range of motion. Neck supple.  Cardiovascular: Regular rhythm, S1 normal and S2 normal.  Tachycardia present.  Exam reveals no gallop and no friction rub.   No murmur  heard. Pulmonary/Chest: Effort normal. No respiratory distress. She has rales. She exhibits no tenderness.  Rales, R > L.  Abdominal: Soft. Normal appearance and bowel sounds are normal. There is no hepatosplenomegaly. There is no tenderness. There is no rebound, no guarding, no tenderness at McBurney's point and negative Murphy's sign. No hernia.  Musculoskeletal: Normal range of motion. She exhibits edema.  Trace pitting edema of BLE.  Neurological: She is alert and oriented to person, place, and time. She has normal strength. No cranial nerve deficit or sensory deficit. Coordination normal. GCS eye subscore is 4. GCS verbal subscore is 5. GCS motor subscore is 6.  Skin: Skin is warm, dry and intact. No rash noted. No cyanosis.  Psychiatric: She has a normal mood and affect. Her speech is normal and behavior is normal. Thought content normal.  Nursing note and vitals reviewed.    ED Treatments / Results  DIAGNOSTIC STUDIES: Oxygen Saturation is 95% on RA, adequate by my interpretation.    COORDINATION OF CARE: 12:10 AM Discussed treatment plan with pt at bedside which includes lab work, blood work, and x-rays and pt agreed to plan.  Labs (all labs ordered are listed, but only abnormal results are displayed) Labs Reviewed  CBC WITH DIFFERENTIAL/PLATELET  COMPREHENSIVE METABOLIC PANEL  BRAIN NATRIURETIC PEPTIDE  URINALYSIS, ROUTINE W REFLEX MICROSCOPIC (NOT AT [facility])    EKG  EKG Interpretation None       Radiology No results found.  Procedures Procedures (including critical care time)  Medications Ordered in ED Medications - No data to display   Initial Impression / Assessment and Plan / ED Course  I have reviewed the triage vital signs and the nursing notes.  Pertinent labs & imaging results that were available during my care of the patient were reviewed by me and considered in my medical decision making (see chart for details).  Clinical Course   Patient presents  to the emergency para for evaluation of shortness of  breath and chest pain. Patient reports that symptoms have been ongoing for 1 day. Patient reports a sharp pain under the right breast. She does have a history of metastaticrest cancer. Cancer has spread to brain, lung, bone.Cardiac evaluation was unremarkable. Chest x-ray did not show any acute abnormality. Patient underwent CT angiography to further evaluate for the possibility of PE. Nothrombus was seen.Patient was given multiple doses of analgesia in the ER and has improved. She will be discharged, follow-up with her primary doctor and oncologist.  Final Clinical Impressions(s) / ED Diagnoses   Final diagnoses:  None  chest wall pain secondary to metastatic cancer  New Prescriptions New Prescriptions   No medications on file  I personally performed the services described in this documentation, which was scribed in my presence. The recorded information has been reviewed and is accurate.     Orpah Greek, MD 07/09/16 [phone]

## 2016-07-09 NOTE — ED Triage Notes (Signed)
Patient here with complaints of SOB since 4am. Hx of breast cancer with mets. Nausea/vomiting. Pain 10/10 all over.

## 2016-07-09 NOTE — Progress Notes (Signed)
Nebulizer order noted in chart. Arrived at bedside for administration. MD at bedside. Order received to discontinue albuterol.

## 2016-07-09 NOTE — ED Notes (Signed)
E signature did not work.

## 2016-07-09 NOTE — ED Notes (Signed)
Patient put on 2L [place] and O2 Sat 95%

## 2016-07-11 ENCOUNTER — Ambulatory Visit: Payer: Self-pay | Admitting: Oncology

## 2016-07-11 ENCOUNTER — Ambulatory Visit: Payer: Self-pay

## 2016-07-13 ENCOUNTER — Telehealth: Payer: Self-pay | Admitting: Oncology

## 2016-07-13 ENCOUNTER — Encounter: Payer: Self-pay | Admitting: Oncology

## 2016-07-13 NOTE — Telephone Encounter (Signed)
07/11/2016 Appointments rescheduled per patient request. Patient missed appointments per not feeling well; called to reschedule.

## 2016-07-15 ENCOUNTER — Ambulatory Visit (HOSPITAL_BASED_OUTPATIENT_CLINIC_OR_DEPARTMENT_OTHER): Payer: Medicaid Other

## 2016-07-15 ENCOUNTER — Ambulatory Visit (HOSPITAL_BASED_OUTPATIENT_CLINIC_OR_DEPARTMENT_OTHER): Payer: Medicaid Other | Admitting: Oncology

## 2016-07-15 ENCOUNTER — Ambulatory Visit: Payer: Self-pay

## 2016-07-15 ENCOUNTER — Telehealth: Payer: Self-pay | Admitting: *Deleted

## 2016-07-15 VITALS — BP 148/93 | HR 98 | Temp 98.7°F | Resp 18 | Ht 70.0 in | Wt 155.6 lb

## 2016-07-15 DIAGNOSIS — C7951 Secondary malignant neoplasm of bone: Secondary | ICD-10-CM

## 2016-07-15 DIAGNOSIS — C773 Secondary and unspecified malignant neoplasm of axilla and upper limb lymph nodes: Secondary | ICD-10-CM

## 2016-07-15 DIAGNOSIS — C50412 Malignant neoplasm of upper-outer quadrant of left female breast: Secondary | ICD-10-CM

## 2016-07-15 DIAGNOSIS — C50912 Malignant neoplasm of unspecified site of left female breast: Secondary | ICD-10-CM

## 2016-07-15 DIAGNOSIS — C7800 Secondary malignant neoplasm of unspecified lung: Secondary | ICD-10-CM

## 2016-07-15 DIAGNOSIS — C7931 Secondary malignant neoplasm of brain: Secondary | ICD-10-CM | POA: Diagnosis not present

## 2016-07-15 DIAGNOSIS — C50919 Malignant neoplasm of unspecified site of unspecified female breast: Secondary | ICD-10-CM

## 2016-07-15 LAB — CBC WITH DIFFERENTIAL/PLATELET
BASO%: 0 % (ref 0.0–2.0)
Basophils Absolute: 0 10*3/uL (ref 0.0–0.1)
EOS%: 0 % (ref 0.0–7.0)
Eosinophils Absolute: 0 10*3/uL (ref 0.0–0.5)
HEMATOCRIT: 37.1 % (ref 34.8–46.6)
HEMOGLOBIN: 12.6 g/dL (ref 11.6–15.9)
LYMPH#: 0.6 10*3/uL — AB (ref 0.9–3.3)
LYMPH%: 15.2 % (ref 14.0–49.7)
MCH: 29.8 pg (ref 25.1–34.0)
MCHC: 34 g/dL (ref 31.5–36.0)
MCV: 87.7 fL (ref 79.5–101.0)
MONO#: 0.1 10*3/uL (ref 0.1–0.9)
MONO%: 2.6 % (ref 0.0–14.0)
NEUT#: 3.5 10*3/uL (ref 1.5–6.5)
NEUT%: 82.2 % — ABNORMAL HIGH (ref 38.4–76.8)
Platelets: 131 10*3/uL — ABNORMAL LOW (ref 145–400)
RBC: 4.23 10*6/uL (ref 3.70–5.45)
RDW: 17.5 % — AB (ref 11.2–14.5)
WBC: 4.2 10*3/uL (ref 3.9–10.3)

## 2016-07-15 LAB — COMPREHENSIVE METABOLIC PANEL
ALBUMIN: 3.3 g/dL — AB (ref 3.5–5.0)
ALK PHOS: 125 U/L (ref 40–150)
ALT: 27 U/L (ref 0–55)
AST: 13 U/L (ref 5–34)
Anion Gap: 12 mEq/L — ABNORMAL HIGH (ref 3–11)
BUN: 25.9 mg/dL (ref 7.0–26.0)
CALCIUM: 8.9 mg/dL (ref 8.4–10.4)
CO2: 23 mEq/L (ref 22–29)
CREATININE: 0.9 mg/dL (ref 0.6–1.1)
Chloride: 104 mEq/L (ref 98–109)
EGFR: >90 mL/min/{1.73_m2} (ref >90)
Glucose: 280 mg/dl — ABNORMAL HIGH (ref 70–140)
Potassium: 4.4 mEq/L (ref 3.5–5.1)
Sodium: 138 mEq/L (ref 136–145)
TOTAL PROTEIN: 7 g/dL (ref 6.4–8.3)
Total Bilirubin: <0.3 mg/dL (ref 0.20–1.20)

## 2016-07-15 MED ORDER — SUCRALFATE 1 G PO TABS: 1.0000 g | ORAL_TABLET | Freq: Four times a day (QID) | ORAL | 2 refills | Status: DC

## 2016-07-15 MED ORDER — LORAZEPAM 0.5 MG PO TABS
0.5000 mg | ORAL_TABLET | Freq: Three times a day (TID) | ORAL | 0 refills | Status: DC | PRN
Start: 2016-07-15 — End: 2016-08-02

## 2016-07-15 MED ORDER — LETROZOLE 2.5 MG PO TABS: 2.5000 mg | ORAL_TABLET | Freq: Every day | ORAL | 0 refills | Status: DC

## 2016-07-15 MED ORDER — ONDANSETRON 4 MG PO TBDP: 4.0000 mg | ORAL_TABLET | Freq: Three times a day (TID) | ORAL | 0 refills | Status: DC | PRN

## 2016-07-15 MED ORDER — GLUCERNA SHAKE PO LIQD: 237.0000 mL | Freq: Three times a day (TID) | ORAL | 0 refills | Status: AC

## 2016-07-15 MED ORDER — OXYCODONE HCL 10 MG PO TABS: 10.0000 mg | ORAL_TABLET | ORAL | 0 refills | Status: DC | PRN

## 2016-07-15 MED ORDER — POLYETHYLENE GLYCOL 3350 17 G PO PACK: 17.0000 g | PACK | Freq: Two times a day (BID) | ORAL | 0 refills | Status: DC

## 2016-07-15 MED ORDER — PROCHLORPERAZINE MALEATE 10 MG PO TABS: 10.0000 mg | ORAL_TABLET | Freq: Four times a day (QID) | ORAL | 0 refills | Status: DC | PRN

## 2016-07-15 MED ORDER — GLIPIZIDE ER 5 MG PO TB24: 10.0000 mg | ORAL_TABLET | Freq: Every day | ORAL | 12 refills | Status: DC

## 2016-07-15 MED ORDER — OXYCODONE HCL ER 80 MG PO T12A: 80.0000 mg | EXTENDED_RELEASE_TABLET | Freq: Two times a day (BID) | ORAL | 0 refills | Status: DC

## 2016-07-15 MED ORDER — PALBOCICLIB 125 MG PO CAPS: 125.0000 mg | ORAL_CAPSULE | Freq: Every day | ORAL | 6 refills | Status: DC

## 2016-07-15 MED ORDER — DEXAMETHASONE 4 MG PO TABS: 4.0000 mg | ORAL_TABLET | Freq: Three times a day (TID) | ORAL | 0 refills | Status: DC

## 2016-07-15 MED ORDER — MIRTAZAPINE 30 MG PO TABS: 30.0000 mg | ORAL_TABLET | Freq: Every day | ORAL | 3 refills | Status: DC

## 2016-07-15 MED ORDER — DENOSUMAB 120 MG/1.7ML ~~LOC~~ SOLN
120.0000 mg | Freq: Once | SUBCUTANEOUS | Status: AC
  Administered 2016-07-15: 120 mg via SUBCUTANEOUS
  Filled 2016-07-15: qty 1.7

## 2016-07-15 NOTE — Telephone Encounter (Signed)
This RN left message on VM for Laura Matthews with  [facility] referrel at [facility].

## 2016-07-15 NOTE — Progress Notes (Signed)
[place]  [phone] [phone]     ID: Laura Matthews DOB: [birth year]  MR#: [id number]  HOZ#:[id number]  Patient Care Team: [name], MD as PCP - General (Family Medicine) Stark Klein, MD as Consulting Physician (General Surgery) Chauncey Cruel, MD as Consulting Physician (Oncology) Thea Silversmith, MD as Consulting Physician (Radiation Oncology) [facility] Bunnie Pion, NP as Nurse Practitioner (Nurse Practitioner) Kyung Rudd, MD as Consulting Physician (Radiation Oncology)  CHIEF COMPLAINT: Stage III left breast cancer  CURRENT TREATMENT: letrozole, palbociclib, denosumab/Xgeva  INTERVAL HISTORY: Laura Matthews returns today for an unscheduled visit. She was supposed to see me September 11 and received her first dose of denosumab on that day. It is not clear whether she simply forgot or there were other reasons. At any rate she showed up today thinking she had an appointment. There was no family accompanying her.  REVIEW OF SYSTEMS:  Tallia tells me all her medicine bottles are empty. She has significant pain. Some days she feels well and has very little pain and other days the pain isn't on incurable. It mostly refers to the left shoulder and left body area she says. She denies unusual headaches, visual changes, nausea or vomiting and she is not constipated from her narcotics she sets. She denies any significant side effects from letrozole or palbociclib. She does not know where she is on the palbociclib cycle except that she has no medications of any type  at present, and she brought it was all her medication bottles which are all in detail empty.. A detailed review of systems today was otherwise stable  BREAST CANCER HISTORY: From the original intake note:  The patient herself palpated a mass in her left breast and brought it to the attention of [facility] NP 04/22/2014. She palpated a large nontender mass at the 4:00 position as well as a small tender  mass in the 6:00 position of the left breast. The patient was set up for imaging at the breast Center where on 05/05/2014 she underwent bilateral diagnostic mammography and left breast ultrasonography. The mammogram showed an irregular mass in the outer left breast with no other suspicious findings. This was firm and fixed at the 3:00 position approximately 10 cm from the nipple. Ultrasound showed a hypoechoic irregular solid mass in this area, measuring 1.8 cm. Ultrasound of the left axilla showed 2 suspicious level I lymph nodes, the largest measuring 1 cm, with a thickened pole. A smaller lymph node measured 0.6 cm but had no visible fatty hilum.  On 05/09/2014 the patient underwent biopsy of the left breast mass (she refused biopsy of the left axilla) which showed ([id number]) and invasive ductal carcinoma, grade 2, estrogen receptor positive at 100%, with strong staining intensity, progesterone receptor 80% positive, with weak staining intensity, with an MIB-1 of 80%, and no HER-2 amplification.  On 05/16/2014 the patient underwent bilateral breast MRI. This showed a 1.7 cm irregular enhancing mass at the 3:00 position of the left breast associated with the biopsy cleft. There were 3 level I left axillary lymph nodes with thickened cortices. The largest measured 1.3 cm. There were no other findings of concern.  Her treatment for stage III disease is detailed below.  METASTATIC DISEASE:  Amarionna presented to the emergency room 06/06/2016 complaining of shortness of breath and pain after an episode of syncope. CT angiogram of the chest raises suspicion for possible edema, infection, or carcinomatosis. There were also numerous lytic lung lesions. Right pleural fluid cytology  collected 06/08/2016 showed ([id number]) adenocarcinoma, estrogen receptor 90% positive, progesterone receptor negative, HER-2 not amplified with a signals ratio of 1.09 and number per cell 1.85. Brain MRI was obtained 06/09/2016 and  showed between 15 and 20 enhancing lesions consistent with metastatic disease, chiefly involving the cerebellum.  Her subsequent history is detailed below   PAST MEDICAL HISTORY: Past Medical History:  Diagnosis Date  . Anemia    low iron  . Arthritis   . Depression    pt denies   . Family history of malignant neoplasm of breast   . Head ache   . Hot flashes   . Lymphedema of breast    left  . Malignant neoplasm of breast (female), unspecified site    left breast  . Mental disorder    depression-  . Newly diagnosed diabetes 11/12/2014   chemo increasing blood glucose; not on meds now  . Radiation 04/22/15-06/23/15   Left Breast    PAST SURGICAL HISTORY: Past Surgical History:  Procedure Laterality Date  . BREAST LUMPECTOMY WITH SENTINEL LYMPH NODE BIOPSY Left 06/11/2014   Procedure: BREAST LUMPECTOMY WITH SENTINEL LYMPH NODE BX, POSSIBLE AXILLARY LYMPH NODE DISSECTION;  Surgeon: Stark Klein, MD;  Location: [place];  Service: General;  Laterality: Left;  . EVACUATION BREAST HEMATOMA Left 07/10/2014   Procedure: EVACUATION OF LEFT BREAST HEMATOMA ;  Surgeon: Stark Klein, MD;  Location: [place];  Service: General;  Laterality: Left;  . FOOT SURGERY  2011   rt  . INCISION AND DRAINAGE Left 07/02/2014   Procedure: Drainage left breast seroma;  Surgeon: Stark Klein, MD;  Location: [place];  Service: General;  Laterality: Left;  . INCISION AND DRAINAGE ABSCESS N/A 11/12/2014   Procedure: ASPIRATION AND INCISION AND DRAINAGE LEFT BREAST ABSCESS;  Surgeon: Michael Boston, MD;  Location: [facility];  Service: General;  Laterality: N/A;  . [place]   right  . PORT-A-CATH REMOVAL Left 07/22/2015   Procedure: REMOVAL PORT-A-CATH;  Surgeon: Stark Klein, MD;  Location: [place];  Service: General;  Laterality: Left;  . PORTACATH PLACEMENT N/A 07/02/2014   Procedure: INSERTION PORT-A-CATH;  Surgeon: Stark Klein, MD;  Location: [place];  Service: General;  Laterality: N/A;  . TUBAL LIGATION    . UTERINE FIBROID SURGERY  2005   ablation    FAMILY HISTORY Family History  Problem Relation Age of Onset  . Breast cancer Mother 21    currently 19; TAH/BSO d/t ?cervical ca at 36  . Lung cancer Father     deceased 59  . Breast cancer Maternal Aunt     dx 18s; currently in her late 39s  . Diabetes Neg Hx    the patient's father died at the age of 3 from lung cancer. The patient's mother was diagnosed with breast cancer at the age of 30. She survives. The patient had 2 brothers, and 2 sisters. One brother may have a diagnosis of "stomach cancer"  GYNECOLOGIC HISTORY:  No LMP recorded. Patient has had an ablation. Menarche age 19 which is also when the patient first had a child. She is GX P3. She stopped having periods after laser ablation in 2005.  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 She has lived with her daughter [name] and her 4 children who are 62, 8, 5, and 4. 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 Currently  the patient is living iOn her own, but sometimes she stays with her son Gilford Raid who works as a Dietitian in [place]. The patient's daughter [name] works as a Scientist, water quality, also in [place]. The patient has 7 grandchildren. She is not a church attender    ADVANCED DIRECTIVES: she has named her daughter Jadene Pierini. [place] as her healthcare power of attorney   HEALTH MAINTENANCE: Social History  Substance Use Topics  . Smoking status: Former Smoker    Packs/day: 0.25    Years: 20.00    Types: Cigarettes  . Smokeless tobacco: Never Used  . Alcohol use No     Comment: Percoset,Vicodin, Oxycotin     Colonoscopy:  PAP:  Bone density:  Lipid panel:  Allergies  Allergen Reactions  . Hydrocodone Itching and Nausea Only    Current Outpatient Prescriptions  Medication Sig Dispense Refill  . dexamethasone  (DECADRON) 4 MG tablet Take 1 tablet (4 mg total) by mouth every 8 (eight) hours. 120 tablet 0  . feeding supplement, GLUCERNA SHAKE, (GLUCERNA SHAKE) LIQD Take 237 mLs by mouth 3 (three) times daily between meals. 90 Can 0  . glipiZIDE (GLUCOTROL XL) 5 MG 24 hr tablet Take 2 tablets (10 mg total) by mouth daily with breakfast. 120 tablet 12  . letrozole (FEMARA) 2.5 MG tablet Take 1 tablet (2.5 mg total) by mouth daily. 30 tablet 0  . LORazepam (ATIVAN) 0.5 MG tablet Take 1 tablet (0.5 mg total) by mouth every 8 (eight) hours as needed for anxiety. 30 tablet 0  . mirtazapine (REMERON) 30 MG tablet Take 1 tablet (30 mg total) by mouth at bedtime. 30 tablet 3  . naproxen sodium (ALEVE) 220 MG tablet Take 2 tablets (440 mg total) by mouth 3 (three) times daily with meals. (Patient not taking: Reported on 07/09/2016) 180 tablet 3  . ondansetron (ZOFRAN ODT) 4 MG disintegrating tablet Take 1 tablet (4 mg total) by mouth every 8 (eight) hours as needed for nausea or vomiting. 30 tablet 0  . oxyCODONE (OXYCONTIN) 80 mg 12 hr tablet Take 1 tablet (80 mg total) by mouth every 12 (twelve) hours. 90 tablet 0  . Oxycodone HCl 10 MG TABS Take 1-2 tablets (10-20 mg total) by mouth every 2 (two) hours as needed. 80 tablet 0  . palbociclib (IBRANCE) 125 MG capsule Take 1 capsule (125 mg total) by mouth daily with breakfast. Take whole with food. 21 capsule 6  . polyethylene glycol (MIRALAX / GLYCOLAX) packet Take 17 g by mouth 2 (two) times daily. 14 each 0  . prochlorperazine (COMPAZINE) 10 MG tablet Take 1 tablet (10 mg total) by mouth every 6 (six) hours as needed for nausea or vomiting. 30 tablet 0  . sucralfate (CARAFATE) 1 g tablet Take 1 tablet (1 g total) by mouth 4 (four) times daily. 120 tablet 2   No current facility-administered medications for this visit.     OBJECTIVE: Middle-aged African American woman  Vitals:   07/15/16 1231  BP: (!) 148/93  Pulse: 98  Resp: 18  Temp: 98.7 F (37.1 C)      Body mass index is 22.33 kg/m.    ECOG FS:1 - Symptomatic but completely ambulatory  Sclerae unicteric, EOMs intact Oropharynx clear and moist No cervical or supraclavicular adenopathy Lungs no rales or rhonchi Heart regular rate and rhythm Abd soft, nontender, positive bowel sounds MSK no focal spinal tenderness, no upper extremity lymphedema Neuro: nonfocal, No outright confusion but scattered thoughts,, pressure of speech  Breasts: Deferred      LAB RESULTS:  CMP     Component Value Date/Time   NA 138 07/15/2016 1218   K 4.4 07/15/2016 1218   CL 99 (L) 07/09/2016 0033   CO2 23 07/15/2016 1218   GLUCOSE 280 (H) 07/15/2016 1218   BUN 25.9 07/15/2016 1218   CREATININE 0.9 07/15/2016 1218   CALCIUM 8.9 07/15/2016 1218   PROT 7.0 07/15/2016 1218   ALBUMIN 3.3 (L) 07/15/2016 1218   AST 13 07/15/2016 1218   ALT 27 07/15/2016 1218   ALKPHOS 125 07/15/2016 1218   BILITOT <0.30 07/15/2016 1218   GFRNONAA >60 07/09/2016 0033   GFRAA >60 07/09/2016 0033    I No results found for: SPEP  Lab Results  Component Value Date   WBC 4.2 07/15/2016   NEUTROABS 3.5 07/15/2016   HGB 12.6 07/15/2016   HCT 37.1 07/15/2016   MCV 87.7 07/15/2016   PLT 131 (L) 07/15/2016      Chemistry      Component Value Date/Time   NA 138 07/15/2016 1218   K 4.4 07/15/2016 1218   CL 99 (L) 07/09/2016 0033   CO2 23 07/15/2016 1218   BUN 25.9 07/15/2016 1218   CREATININE 0.9 07/15/2016 1218      Component Value Date/Time   CALCIUM 8.9 07/15/2016 1218   ALKPHOS 125 07/15/2016 1218   AST 13 07/15/2016 1218   ALT 27 07/15/2016 1218   BILITOT <0.30 07/15/2016 1218       Lab Results  Component Value Date   LABCA2 1,356.9 (H) 06/08/2016    No components found for: [id number]  No results for input(s): INR in the last 168 hours.  Urinalysis    Component Value Date/Time   COLORURINE YELLOW 07/09/2016 0057   APPEARANCEUR CLEAR 07/09/2016 0057   LABSPEC 1.026 07/09/2016 0057    PHURINE 6.5 07/09/2016 0057   GLUCOSEU NEGATIVE 07/09/2016 0057   HGBUR NEGATIVE 07/09/2016 0057   BILIRUBINUR NEGATIVE 07/09/2016 0057   KETONESUR NEGATIVE 07/09/2016 0057   PROTEINUR NEGATIVE 07/09/2016 0057   UROBILINOGEN 0.2 06/03/2014 1050   NITRITE NEGATIVE 07/09/2016 0057   LEUKOCYTESUR NEGATIVE 07/09/2016 0057    STUDIES: Dg Chest 2 View  Result Date: 07/09/2016 CLINICAL DATA:  50 year old female with shortness of breath. History of lung and breast cancers. EXAM: CHEST  2 VIEW COMPARISON:  Chest radiograph dated 06/09/2016 FINDINGS: Two views of the chest demonstrate stop mild diffuse interstitial prominence with more nodular appearance in the mid to lower lung field. There has been interval improved appearance of the lung markings compared to the prior study. There is no focal consolidation, or pneumothorax. Probable trace bilateral pleural effusions. The cardiac silhouette is within normal limits. Left axillary surgical clips noted. No acute osseous pathology. IMPRESSION: Mild interstitial prominence and nodularity primarily involving the mid to lower lung field. Although findings may be related to mild vascular congestion, given interval improved appearance compared to the prior study, lymphangitic spread of tumor is not entirely excluded. Clinical correlation and follow-up recommended. No focal consolidation. Electronically Signed   By: Anner Crete M.D.   On: 07/09/2016 01:48   Ct Angio Chest Pe W Or Wo Contrast  Result Date: 07/09/2016 CLINICAL DATA:  Acute onset of shortness of breath. Nausea and vomiting. Generalized chest pain. Initial encounter. EXAM: CT ANGIOGRAPHY CHEST WITH CONTRAST TECHNIQUE: Multidetector CT imaging of the chest was performed using the standard protocol during bolus administration of intravenous contrast. Multiplanar CT image reconstructions and MIPs were  obtained to evaluate the vascular anatomy. CONTRAST:  100 mL of Isovue 370 IV contrast COMPARISON:   Chest radiograph performed earlier today at 12:43 a.m., and CTA of the chest performed 06/07/2016 FINDINGS: There is no evidence of pulmonary embolus. Trace bilateral pleural effusions are noted. Interstitial prominence is noted, concerning for mild interstitial edema. No pneumothorax is seen. There is no evidence of pneumothorax. Mild soft-tissue density is noted tracking about the right mainstem bronchus and azygoesophageal recess. This is similar in appearance to the prior study, and may reflect the patient's known metastatic disease. No axillary lymphadenopathy is seen. The visualized portions of the thyroid gland are unremarkable in appearance. Skin thickening is noted along the left breast, with mild associated soft tissue inflammation, concerning for inflammatory breast cancer. The visualized portions of the liver and spleen are unremarkable. Scattered sclerotic and lytic foci within the visualized osseous structures raise concern for metastatic disease. Review of the MIP images confirms the above findings. IMPRESSION: 1. No evidence of pulmonary embolus. 2. Trace bilateral pleural effusions noted. Interstitial prominence raises concern for mild interstitial edema. 3. Mild soft tissue density tracking about the right mainstem bronchus and azygoesophageal recess is similar in appearance, and may reflect the patient's known metastatic disease. 4. Skin thickening along the left breast, with mild associated soft tissue inflammation, raising concern for inflammatory breast cancer, as previously noted. 5. Scattered sclerotic and lytic foci within the visualized osseous structures raise concern for metastatic disease. Electronically Signed   By: Garald Balding M.D.   On: 07/09/2016 06:15    ASSESSMENT: 50 y.o. [place] woman status post left breast upper outer quadrant biopsy 05/09/2014 for a clinicalT1c N1, stage IIA invasive ductal carcinoma, grade 2, estrogen and progesterone receptor positive, HER-2  nonamplified, with an MIB-1 of 80%  (1) patient met with genetics counselor 05/29/2014 but decided against genetics testing   (2) tobacco abuse. The patient has been strongly urged to discontinue smoking. She was prescribed a nicotine patch 09/02/2014  (3) status post left lumpectomy and axillary lymph node dissection 06/11/2014 for a pT1c pN3, stage IIIC invasive ductal carcinoma, grade 3, with repeat HER-2 negative  (4) adjuvant chemotherapy consisting of doxorubicin and cyclophosphamide in dose dense fashion x4, completed 09/09/2014, followed by paclitaxel weekly x12 completed 03/09/2015  (5) adjuvant radiation 04/22/15-06/23/15 Left breast/ 45 Gy at 1.8 Gy per fraction x 25 fractions.  Left supraclavicular fossa and axilla/ 45 Gy at 1.8 Gy per fraction x 25 fractions Left breast boost/ 16 Gy at 2 Gy per fraction x 8 fractions  (6) tamoxifen started 07/01/2015, discontinued 06/09/2016 with evidence of disease progression  METASTATIC DISEASE: involving brain, lungs and bone documented August 2017 (7) CT angio 06/06/2016 c/w lymphangitic carcinomatosis and lytic bone lesions                 (a) right thoracentesis 06/08/2016-- cytology confirms adenocarcinoma, estrogen receptor positive                 (b) brain MRI 06/09/2016 shows multiple metastases   (c) CA 27-29 is informative  (8) starting letrozole 06/09/2016; palbociclib added 06/24/2016  (9) radiation to CNS and spine in 10 fractions 06/13/2016 through 06/27/2016  (10) poorly controlled pain:   (a) adjusted to 160 mg oxycontin BID with OxyIR PRN as of 07/15/2016  (11) denosumab/Xgeva started 07/15/2016.   PLAN: I don't think Deriana is going to be able to make it living alone. Her daughter has offered to take her in at least temporarily  and I strongly suggested she moved in with her daughter for now. [place] has a history of homelessness and her apartment is very precious to her. At the same time she don't think is going  to be capable of taking her medicines appropriately or even keeping her appointments here appropriately.  Accordingly the plan is for her to move in with her daughter. I am also asking in home palliative care to visit the patient monitor her medications and compliance and just assess the home situation.  Otherwise her counts are holding up well, so the plan is to continue the palbociclib at the current dose, as well as the letrozole.  She received her first dose of denosumab today. We will repeat that monthly. I made her a return appointment with me for that date, but she knows we will be glad to see her before that if necessary.  She will need a restaging brain MRI late October or early November.  She knows to call for any other problems that may develop before the next visit here     Chauncey Cruel, MD 07/15/16

## 2016-07-24 NOTE — Progress Notes (Signed)
[place]  [phone]) [phone] [phone]) [phone]     ID: Laura Matthews DOB: Sep 01, 1966  MR#: [id number]  TDD#:[id number]  Patient Care Team: [name], MD as PCP - General (Family Medicine) Stark Klein, MD as Consulting Physician (General Surgery) Chauncey Cruel, MD as Consulting Physician (Oncology) Thea Silversmith, MD as Consulting Physician (Radiation Oncology) [facility] Bunnie Pion, NP as Nurse Practitioner (Nurse Practitioner) Kyung Rudd, MD as Consulting Physician (Radiation Oncology)  CHIEF COMPLAINT: Stage III left breast cancer  CURRENT TREATMENT: letrozole, palbociclib, denosumab/Xgeva  INTERVAL HISTORY: Laura Matthews returns today for follow-up of her metastatic breast cancer. She should be on letrozole and palbociclib and she was also receiving oxycodone and OxyContin for pain control. However she tells me she "lost all my medications". She had them all in a bag and the back was gone. Note that I refilled her narcotics less than 2 weeks ago because of a similar episode.  The good news is that her nephew has moved in with her. He does work a 3d shift job, from 3 in the morning to about 3 in the afternoon, but at least he will be there part of the day and he can help monitor her medications.  REVIEW OF SYSTEMS:  Laura Matthews continues to have severe pain which is very sporadic. She will not hurt and then she will heard a lot. She cannot tell me what medication she is taking, or how she should be taking them, even though we have discussed this at length. She again came to today's visit at the wrong time (by approximately 6 hours) but at least she came on the right day. She denies unusual headaches, visual changes, nausea, or vomiting. She denies falls or balance problems. She denies constipation from the narcotics. A detailed review of systems was otherwise stable  BREAST CANCER HISTORY: From the original intake note:  The patient herself palpated a mass in her left  breast and brought it to the attention of [facility] NP 04/22/2014. She palpated a large nontender mass at the 4:00 position as well as a small tender mass in the 6:00 position of the left breast. The patient was set up for imaging at the breast Center where on 05/05/2014 she underwent bilateral diagnostic mammography and left breast ultrasonography. The mammogram showed an irregular mass in the outer left breast with no other suspicious findings. This was firm and fixed at the 3:00 position approximately 10 cm from the nipple. Ultrasound showed a hypoechoic irregular solid mass in this area, measuring 1.8 cm. Ultrasound of the left axilla showed 2 suspicious level I lymph nodes, the largest measuring 1 cm, with a thickened pole. A smaller lymph node measured 0.6 cm but had no visible fatty hilum.  On 05/09/2014 the patient underwent biopsy of the left breast mass (she refused biopsy of the left axilla) which showed ([id number]) and invasive ductal carcinoma, grade 2, estrogen receptor positive at 100%, with strong staining intensity, progesterone receptor 80% positive, with weak staining intensity, with an MIB-1 of 80%, and no HER-2 amplification.  On 05/16/2014 the patient underwent bilateral breast MRI. This showed a 1.7 cm irregular enhancing mass at the 3:00 position of the left breast associated with the biopsy cleft. There were 3 level I left axillary lymph nodes with thickened cortices. The largest measured 1.3 cm. There were no other findings of concern.  Her treatment for stage III disease is detailed below.  METASTATIC DISEASE:  Laura Matthews presented to the emergency room 06/06/2016 complaining  of shortness of breath and pain after an episode of syncope. CT angiogram of the chest raises suspicion for possible edema, infection, or carcinomatosis. There were also numerous lytic lung lesions. Right pleural fluid cytology collected 06/08/2016 showed ([id number]) adenocarcinoma, estrogen receptor 90%  positive, progesterone receptor negative, HER-2 not amplified with a signals ratio of 1.09 and number per cell 1.85. Brain MRI was obtained 06/09/2016 and showed between 15 and 20 enhancing lesions consistent with metastatic disease, chiefly involving the cerebellum.  Her subsequent history is detailed below   PAST MEDICAL HISTORY: Past Medical History:  Diagnosis Date  . Anemia    low iron  . Arthritis   . Depression    pt denies   . Family history of malignant neoplasm of breast   . Head ache   . Hot flashes   . Lymphedema of breast    left  . Malignant neoplasm of breast (female), unspecified site    left breast  . Mental disorder    depression-  . Newly diagnosed diabetes 11/12/2014   chemo increasing blood glucose; not on meds now  . Radiation 04/22/15-06/23/15   Left Breast    PAST SURGICAL HISTORY: Past Surgical History:  Procedure Laterality Date  . BREAST LUMPECTOMY WITH SENTINEL LYMPH NODE BIOPSY Left 06/11/2014   Procedure: BREAST LUMPECTOMY WITH SENTINEL LYMPH NODE BX, POSSIBLE AXILLARY LYMPH NODE DISSECTION;  Surgeon: Stark Klein, MD;  Location: [place];  Service: General;  Laterality: Left;  . EVACUATION BREAST HEMATOMA Left 07/10/2014   Procedure: EVACUATION OF LEFT BREAST HEMATOMA ;  Surgeon: Stark Klein, MD;  Location: [place];  Service: General;  Laterality: Left;  . FOOT SURGERY  2011   rt  . INCISION AND DRAINAGE Left 07/02/2014   Procedure: Drainage left breast seroma;  Surgeon: Stark Klein, MD;  Location: [place];  Service: General;  Laterality: Left;  . INCISION AND DRAINAGE ABSCESS N/A 11/12/2014   Procedure: ASPIRATION AND INCISION AND DRAINAGE LEFT BREAST ABSCESS;  Surgeon: Michael Boston, MD;  Location: [facility];  Service: General;  Laterality: N/A;  . [place]   right  . PORT-A-CATH REMOVAL Left 07/22/2015   Procedure: REMOVAL PORT-A-CATH;  Surgeon: Stark Klein, MD;  Location: [place];   Service: General;  Laterality: Left;  . PORTACATH PLACEMENT N/A 07/02/2014   Procedure: INSERTION PORT-A-CATH;  Surgeon: Stark Klein, MD;  Location: [place];  Service: General;  Laterality: N/A;  . TUBAL LIGATION    . UTERINE FIBROID SURGERY  2005   ablation    FAMILY HISTORY Family History  Problem Relation Age of Onset  . Breast cancer Mother 82    currently 75; TAH/BSO d/t ?cervical ca at 5  . Lung cancer Father     deceased 66  . Breast cancer Maternal Aunt     dx 67s; currently in her late 61s  . Diabetes Neg Hx    the patient's father died at the age of 25 from lung cancer. The patient's mother was diagnosed with breast cancer at the age of 70. She survives. The patient had 2 brothers, and 2 sisters. One brother may have a diagnosis of "stomach cancer"  GYNECOLOGIC HISTORY:  No LMP recorded. Patient has had an ablation. Menarche age 89 which is also when the patient first had a child. She is GX P3. She stopped having periods after laser ablation in 2005.  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 She has lived with her daughter [name] and her 4 children who are 57, 8, 5, and 4. 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 Currently the patient is living iOn her own, but sometimes she stays with her son Gilford Raid who works as a Dietitian in [place]. The patient's daughter [name] works as a Scientist, water quality, also in [place]. The patient has 7 grandchildren. She is not a church attender    ADVANCED DIRECTIVES: she has named her daughter Jadene Pierini. [place] as her healthcare power of attorney   HEALTH MAINTENANCE: Social History  Substance Use Topics  . Smoking status: Former Smoker    Packs/day: 0.25    Years: 20.00    Types: Cigarettes  . Smokeless tobacco: Never Used  . Alcohol use No     Comment: Percoset,Vicodin, Oxycotin     Colonoscopy:  PAP:  Bone density:  Lipid  panel:  Allergies  Allergen Reactions  . Hydrocodone Itching and Nausea Only    Current Outpatient Prescriptions  Medication Sig Dispense Refill  . albuterol (PROVENTIL HFA;VENTOLIN HFA) 108 (90 Base) MCG/ACT inhaler Inhale 2 puffs into the lungs every 6 (six) hours as needed for wheezing or shortness of breath. 1 Inhaler 2  . dexamethasone (DECADRON) 4 MG tablet Take 1 tablet (4 mg total) by mouth every 8 (eight) hours. 120 tablet 0  . feeding supplement, GLUCERNA SHAKE, (GLUCERNA SHAKE) LIQD Take 237 mLs by mouth 3 (three) times daily between meals. 90 Can 0  . glipiZIDE (GLUCOTROL XL) 5 MG 24 hr tablet Take 2 tablets (10 mg total) by mouth daily with breakfast. 120 tablet 12  . letrozole (FEMARA) 2.5 MG tablet Take 1 tablet (2.5 mg total) by mouth daily. 30 tablet 0  . LORazepam (ATIVAN) 0.5 MG tablet Take 1 tablet (0.5 mg total) by mouth every 8 (eight) hours as needed for anxiety. 30 tablet 0  . methadone (DOLOPHINE) 10 MG tablet Take 1 tablet (10 mg total) by mouth every 8 (eight) hours. 45 tablet 0  . mirtazapine (REMERON) 30 MG tablet Take 1 tablet (30 mg total) by mouth at bedtime. 30 tablet 3  . ondansetron (ZOFRAN ODT) 4 MG disintegrating tablet Take 1 tablet (4 mg total) by mouth every 8 (eight) hours as needed for nausea or vomiting. 30 tablet 0  . palbociclib (IBRANCE) 125 MG capsule Take 1 capsule (125 mg total) by mouth daily with breakfast. Take whole with food. 21 capsule 6  . prochlorperazine (COMPAZINE) 10 MG tablet Take 1 tablet (10 mg total) by mouth every 6 (six) hours as needed for nausea or vomiting. 30 tablet 0   No current facility-administered medications for this visit.     OBJECTIVE: Middle-aged [place] American woman  There were no vitals filed for this visit.   There is no height or weight on file to calculate BMI.    ECOG FS:1 - Symptomatic but completely ambulatory  Sclerae unicteric, pupils round and equal Oropharynx clear and moist-- no thrush or other  lesions No cervical or supraclavicular adenopathy Lungs no rales or rhonchi Heart regular rate and rhythm Abd soft, nontender, positive bowel sounds MSK no focal spinal tenderness, no upper extremity lymphedema Neuro: nonfocal, well oriented, appropriate affect Breasts: Deferred  LAB RESULTS:  CMP     Component Value Date/Time   NA 137 07/25/2016 1017   K 4.1 07/25/2016 1017   CL 99 (L) 07/09/2016 0033   CO2 23 07/25/2016 1017  GLUCOSE 292 (H) 07/25/2016 1017   BUN 13.1 07/25/2016 1017   CREATININE 0.7 07/25/2016 1017   CALCIUM 9.0 07/25/2016 1017   PROT 7.0 07/25/2016 1017   ALBUMIN 3.2 (L) 07/25/2016 1017   AST 19 07/25/2016 1017   ALT 35 07/25/2016 1017   ALKPHOS 123 07/25/2016 1017   BILITOT <0.30 07/25/2016 1017   GFRNONAA >60 07/09/2016 0033   GFRAA >60 07/09/2016 0033    I No results found for: SPEP  Lab Results  Component Value Date   WBC 4.5 07/25/2016   NEUTROABS 3.2 07/25/2016   HGB 11.9 07/25/2016   HCT 35.4 07/25/2016   MCV 88.3 07/25/2016   PLT 194 07/25/2016      Chemistry      Component Value Date/Time   NA 137 07/25/2016 1017   K 4.1 07/25/2016 1017   CL 99 (L) 07/09/2016 0033   CO2 23 07/25/2016 1017   BUN 13.1 07/25/2016 1017   CREATININE 0.7 07/25/2016 1017      Component Value Date/Time   CALCIUM 9.0 07/25/2016 1017   ALKPHOS 123 07/25/2016 1017   AST 19 07/25/2016 1017   ALT 35 07/25/2016 1017   BILITOT <0.30 07/25/2016 1017       Lab Results  Component Value Date   LABCA2 1,356.9 (H) 06/08/2016    No components found for: [id number]  No results for input(s): INR in the last 168 hours.  Urinalysis    Component Value Date/Time   COLORURINE YELLOW 07/09/2016 0057   APPEARANCEUR CLEAR 07/09/2016 0057   LABSPEC 1.026 07/09/2016 0057   PHURINE 6.5 07/09/2016 0057   GLUCOSEU NEGATIVE 07/09/2016 0057   HGBUR NEGATIVE 07/09/2016 0057   BILIRUBINUR NEGATIVE 07/09/2016 0057   KETONESUR NEGATIVE 07/09/2016 0057   PROTEINUR  NEGATIVE 07/09/2016 0057   UROBILINOGEN 0.2 06/03/2014 1050   NITRITE NEGATIVE 07/09/2016 0057   LEUKOCYTESUR NEGATIVE 07/09/2016 0057    STUDIES: Dg Chest 2 View  Result Date: 07/09/2016 CLINICAL DATA:  50 year old female with shortness of breath. History of lung and breast cancers. EXAM: CHEST  2 VIEW COMPARISON:  Chest radiograph dated 06/09/2016 FINDINGS: Two views of the chest demonstrate stop mild diffuse interstitial prominence with more nodular appearance in the mid to lower lung field. There has been interval improved appearance of the lung markings compared to the prior study. There is no focal consolidation, or pneumothorax. Probable trace bilateral pleural effusions. The cardiac silhouette is within normal limits. Left axillary surgical clips noted. No acute osseous pathology. IMPRESSION: Mild interstitial prominence and nodularity primarily involving the mid to lower lung field. Although findings may be related to mild vascular congestion, given interval improved appearance compared to the prior study, lymphangitic spread of tumor is not entirely excluded. Clinical correlation and follow-up recommended. No focal consolidation. Electronically Signed   By: Anner Crete M.D.   On: 07/09/2016 01:48   Ct Angio Chest Pe W Or Wo Contrast  Result Date: 07/09/2016 CLINICAL DATA:  Acute onset of shortness of breath. Nausea and vomiting. Generalized chest pain. Initial encounter. EXAM: CT ANGIOGRAPHY CHEST WITH CONTRAST TECHNIQUE: Multidetector CT imaging of the chest was performed using the standard protocol during bolus administration of intravenous contrast. Multiplanar CT image reconstructions and MIPs were obtained to evaluate the vascular anatomy. CONTRAST:  100 mL of Isovue 370 IV contrast COMPARISON:  Chest radiograph performed earlier today at 12:43 a.m., and CTA of the chest performed 06/07/2016 FINDINGS: There is no evidence of pulmonary embolus. Trace bilateral pleural effusions are  noted. Interstitial prominence is noted, concerning for mild interstitial edema. No pneumothorax is seen. There is no evidence of pneumothorax. Mild soft-tissue density is noted tracking about the right mainstem bronchus and azygoesophageal recess. This is similar in appearance to the prior study, and may reflect the patient's known metastatic disease. No axillary lymphadenopathy is seen. The visualized portions of the thyroid gland are unremarkable in appearance. Skin thickening is noted along the left breast, with mild associated soft tissue inflammation, concerning for inflammatory breast cancer. The visualized portions of the liver and spleen are unremarkable. Scattered sclerotic and lytic foci within the visualized osseous structures raise concern for metastatic disease. Review of the MIP images confirms the above findings. IMPRESSION: 1. No evidence of pulmonary embolus. 2. Trace bilateral pleural effusions noted. Interstitial prominence raises concern for mild interstitial edema. 3. Mild soft tissue density tracking about the right mainstem bronchus and azygoesophageal recess is similar in appearance, and may reflect the patient's known metastatic disease. 4. Skin thickening along the left breast, with mild associated soft tissue inflammation, raising concern for inflammatory breast cancer, as previously noted. 5. Scattered sclerotic and lytic foci within the visualized osseous structures raise concern for metastatic disease. Electronically Signed   By: Garald Balding M.D.   On: 07/09/2016 06:15    ASSESSMENT: 50 y.o. [place] woman status post left breast upper outer quadrant biopsy 05/09/2014 for a clinicalT1c N1, stage IIA invasive ductal carcinoma, grade 2, estrogen and progesterone receptor positive, HER-2 nonamplified, with an MIB-1 of 80%  (1) patient met with genetics counselor 05/29/2014 but decided against genetics testing   (2) tobacco abuse. The patient has been strongly urged to  discontinue smoking. She was prescribed a nicotine patch 09/02/2014  (3) status post left lumpectomy and axillary lymph node dissection 06/11/2014 for a pT1c pN3, stage IIIC invasive ductal carcinoma, grade 3, with repeat HER-2 negative  (4) adjuvant chemotherapy consisting of doxorubicin and cyclophosphamide in dose dense fashion x4, completed 09/09/2014, followed by paclitaxel weekly x12 completed 03/09/2015  (5) adjuvant radiation 04/22/15-06/23/15 Left breast/ 45 Gy at 1.8 Gy per fraction x 25 fractions.  Left supraclavicular fossa and axilla/ 45 Gy at 1.8 Gy per fraction x 25 fractions Left breast boost/ 16 Gy at 2 Gy per fraction x 8 fractions  (6) tamoxifen started 07/01/2015, discontinued 06/09/2016 with evidence of disease progression  METASTATIC DISEASE: involving brain, lungs and bone documented August 2017 (7) CT angio 06/06/2016 c/w lymphangitic carcinomatosis and lytic bone lesions                 (a) right thoracentesis 06/08/2016-- cytology confirms adenocarcinoma, estrogen receptor positive                 (b) brain MRI 06/09/2016 shows multiple metastases   (c) CA 27-29 is informative  (8) starting letrozole 06/09/2016; palbociclib added 06/24/2016  (9) radiation to CNS and spine in 10 fractions 06/13/2016 through 06/27/2016  (10) poorly controlled pain:   (a) initially treated with oxycontin BID with OxyIR PRN, discontinued 07/25/2016 with the patient unable to keep her pain management agreement (multiple episodes of loss medications, need for early refills, not calling with medication changes, etc.  (b) started on methadone 10 mg by mouth 3 times a day as of 07/25/2016.  (11) denosumab/Xgeva started 07/15/2016.   PLAN: Laura Matthews situation continues to be chaotic. The family is trying to help and they have arranged for her nephew to live with her. He was present today and I was able to  explain to him that she needs to take letrozole every day and that she needs to  take the palbociclib daily for 3 weeks and then off 1 week.  We also discussed pain management. She understands I can no longer ride for any narcotics because they have been too many irregularities. Instead we are going to go with methadone. The starting dose will be 10 mg 3 times a day given her prior use of narcotics.. We will adjust this as needed to make sure she tolerates it well. For breakthrough pain she will use Aleve, 2 tablets 3 times a day as needed with food.  We have arranged for in home palliative care to also assist the patient. They have contacted her and tried to make an appointment but so far she has not given them a specific time.  Laura Matthews is seeing me in 2 weeks. Hopefully things will be under better control by then. We need to make sure she is taking her letrozole and palbociclib correctly. We need to check her labs on a regular basis. At that point I will set her up for repeat brain MRI later in October.  She knows to call for any problems that may develop before her next visit.    Chauncey Cruel, MD 07/25/16

## 2016-07-25 ENCOUNTER — Telehealth: Payer: Self-pay | Admitting: Oncology

## 2016-07-25 ENCOUNTER — Ambulatory Visit: Payer: Self-pay

## 2016-07-25 ENCOUNTER — Other Ambulatory Visit (HOSPITAL_BASED_OUTPATIENT_CLINIC_OR_DEPARTMENT_OTHER): Payer: Medicaid Other

## 2016-07-25 ENCOUNTER — Ambulatory Visit (HOSPITAL_BASED_OUTPATIENT_CLINIC_OR_DEPARTMENT_OTHER): Payer: Medicaid Other | Admitting: Oncology

## 2016-07-25 ENCOUNTER — Other Ambulatory Visit: Payer: Self-pay | Admitting: *Deleted

## 2016-07-25 DIAGNOSIS — C50912 Malignant neoplasm of unspecified site of left female breast: Secondary | ICD-10-CM

## 2016-07-25 DIAGNOSIS — C7802 Secondary malignant neoplasm of left lung: Secondary | ICD-10-CM

## 2016-07-25 DIAGNOSIS — C773 Secondary and unspecified malignant neoplasm of axilla and upper limb lymph nodes: Secondary | ICD-10-CM

## 2016-07-25 DIAGNOSIS — C7951 Secondary malignant neoplasm of bone: Secondary | ICD-10-CM | POA: Diagnosis not present

## 2016-07-25 DIAGNOSIS — C7931 Secondary malignant neoplasm of brain: Secondary | ICD-10-CM | POA: Diagnosis not present

## 2016-07-25 DIAGNOSIS — C50412 Malignant neoplasm of upper-outer quadrant of left female breast: Secondary | ICD-10-CM

## 2016-07-25 DIAGNOSIS — C50919 Malignant neoplasm of unspecified site of unspecified female breast: Secondary | ICD-10-CM

## 2016-07-25 DIAGNOSIS — G893 Neoplasm related pain (acute) (chronic): Secondary | ICD-10-CM

## 2016-07-25 LAB — CBC WITH DIFFERENTIAL/PLATELET
BASO%: 1.3 % (ref 0.0–2.0)
BASOS ABS: 0.1 10*3/uL (ref 0.0–0.1)
EOS ABS: 0 10*3/uL (ref 0.0–0.5)
EOS%: 0 % (ref 0.0–7.0)
HCT: 35.4 % (ref 34.8–46.6)
HEMOGLOBIN: 11.9 g/dL (ref 11.6–15.9)
LYMPH#: 0.9 10*3/uL (ref 0.9–3.3)
LYMPH%: 20.8 % (ref 14.0–49.7)
MCH: 29.7 pg (ref 25.1–34.0)
MCHC: 33.6 g/dL (ref 31.5–36.0)
MCV: 88.3 fL (ref 79.5–101.0)
MONO#: 0.3 10*3/uL (ref 0.1–0.9)
MONO%: 7.1 % (ref 0.0–14.0)
NEUT#: 3.2 10*3/uL (ref 1.5–6.5)
NEUT%: 70.8 % (ref 38.4–76.8)
NRBC: 7 % — AB (ref 0–0)
PLATELETS: 194 10*3/uL (ref 145–400)
RBC: 4.01 10*6/uL (ref 3.70–5.45)
RDW: 19.1 % — AB (ref 11.2–14.5)
WBC: 4.5 10*3/uL (ref 3.9–10.3)

## 2016-07-25 LAB — COMPREHENSIVE METABOLIC PANEL
ALBUMIN: 3.2 g/dL — AB (ref 3.5–5.0)
ALK PHOS: 123 U/L (ref 40–150)
ALT: 35 U/L (ref 0–55)
ANION GAP: 13 meq/L — AB (ref 3–11)
AST: 19 U/L (ref 5–34)
BUN: 13.1 mg/dL (ref 7.0–26.0)
CALCIUM: 9 mg/dL (ref 8.4–10.4)
CO2: 23 mEq/L (ref 22–29)
Chloride: 102 mEq/L (ref 98–109)
Creatinine: 0.7 mg/dL (ref 0.6–1.1)
GLUCOSE: 292 mg/dL — AB (ref 70–140)
Potassium: 4.1 mEq/L (ref 3.5–5.1)
Sodium: 137 mEq/L (ref 136–145)
TOTAL PROTEIN: 7 g/dL (ref 6.4–8.3)

## 2016-07-25 MED ORDER — MIRTAZAPINE 30 MG PO TABS: 30.0000 mg | ORAL_TABLET | Freq: Every day | ORAL | 3 refills | Status: DC

## 2016-07-25 MED ORDER — ALBUTEROL SULFATE HFA 108 (90 BASE) MCG/ACT IN AERS: 2.0000 | INHALATION_SPRAY | Freq: Four times a day (QID) | RESPIRATORY_TRACT | 2 refills | Status: DC | PRN

## 2016-07-25 MED ORDER — GLIPIZIDE ER 5 MG PO TB24
10.0000 mg | ORAL_TABLET | Freq: Every day | ORAL | 12 refills | Status: AC
Start: 2016-07-25 — End: ?

## 2016-07-25 MED ORDER — LETROZOLE 2.5 MG PO TABS: 2.5000 mg | ORAL_TABLET | Freq: Every day | ORAL | 0 refills | Status: DC

## 2016-07-25 MED ORDER — PROCHLORPERAZINE MALEATE 10 MG PO TABS: 10.0000 mg | ORAL_TABLET | Freq: Four times a day (QID) | ORAL | 0 refills | Status: DC | PRN

## 2016-07-25 MED ORDER — ONDANSETRON 4 MG PO TBDP: 4.0000 mg | ORAL_TABLET | Freq: Three times a day (TID) | ORAL | 0 refills | Status: DC | PRN

## 2016-07-25 MED ORDER — METHADONE HCL 10 MG PO TABS: 10.0000 mg | ORAL_TABLET | Freq: Three times a day (TID) | ORAL | 0 refills | Status: DC

## 2016-07-25 NOTE — Telephone Encounter (Signed)
vm full. Unable to lvm to inform pt of 10/13 appt per LOS

## 2016-07-26 ENCOUNTER — Ambulatory Visit
Admission: RE | Admit: 2016-07-26 | Discharge: 2016-07-26 | Disposition: A | Discharge status: Home / Self Care | Payer: No Typology Code available for payment source | Source: Ambulatory Visit | Attending: Radiation Oncology | Admitting: Radiation Oncology

## 2016-07-31 ENCOUNTER — Encounter (HOSPITAL_COMMUNITY): Payer: Self-pay

## 2016-07-31 ENCOUNTER — Emergency Department (HOSPITAL_COMMUNITY): Payer: Medicaid Other

## 2016-07-31 ENCOUNTER — Inpatient Hospital Stay (HOSPITAL_COMMUNITY)
Admission: EM | Admit: 2016-07-31 | Discharge: 2016-08-02 | DRG: 180 | Disposition: A | Discharge status: Home / Self Care | Payer: Medicaid Other | Attending: Internal Medicine | Admitting: Internal Medicine

## 2016-07-31 ENCOUNTER — Observation Stay (HOSPITAL_COMMUNITY): Payer: Medicaid Other

## 2016-07-31 DIAGNOSIS — Z720 Tobacco use: Secondary | ICD-10-CM

## 2016-07-31 DIAGNOSIS — Z885 Allergy status to narcotic agent status: Secondary | ICD-10-CM

## 2016-07-31 DIAGNOSIS — R059 Cough, unspecified: Secondary | ICD-10-CM | POA: Diagnosis present

## 2016-07-31 DIAGNOSIS — C7800 Secondary malignant neoplasm of unspecified lung: Principal | ICD-10-CM | POA: Diagnosis present

## 2016-07-31 DIAGNOSIS — Z515 Encounter for palliative care: Secondary | ICD-10-CM | POA: Diagnosis not present

## 2016-07-31 DIAGNOSIS — Z9851 Tubal ligation status: Secondary | ICD-10-CM | POA: Diagnosis not present

## 2016-07-31 DIAGNOSIS — E440 Moderate protein-calorie malnutrition: Secondary | ICD-10-CM | POA: Diagnosis present

## 2016-07-31 DIAGNOSIS — J9621 Acute and chronic respiratory failure with hypoxia: Secondary | ICD-10-CM | POA: Diagnosis not present

## 2016-07-31 DIAGNOSIS — R0602 Shortness of breath: Secondary | ICD-10-CM | POA: Diagnosis not present

## 2016-07-31 DIAGNOSIS — I5032 Chronic diastolic (congestive) heart failure: Secondary | ICD-10-CM | POA: Diagnosis present

## 2016-07-31 DIAGNOSIS — Z66 Do not resuscitate: Secondary | ICD-10-CM | POA: Diagnosis present

## 2016-07-31 DIAGNOSIS — Z79899 Other long term (current) drug therapy: Secondary | ICD-10-CM | POA: Diagnosis not present

## 2016-07-31 DIAGNOSIS — Z6824 Body mass index (BMI) 24.0-24.9, adult: Secondary | ICD-10-CM

## 2016-07-31 DIAGNOSIS — Z923 Personal history of irradiation: Secondary | ICD-10-CM

## 2016-07-31 DIAGNOSIS — Z7189 Other specified counseling: Secondary | ICD-10-CM

## 2016-07-31 DIAGNOSIS — R0902 Hypoxemia: Secondary | ICD-10-CM | POA: Diagnosis present

## 2016-07-31 DIAGNOSIS — C50412 Malignant neoplasm of upper-outer quadrant of left female breast: Secondary | ICD-10-CM | POA: Diagnosis present

## 2016-07-31 DIAGNOSIS — Z803 Family history of malignant neoplasm of breast: Secondary | ICD-10-CM

## 2016-07-31 DIAGNOSIS — E1165 Type 2 diabetes mellitus with hyperglycemia: Secondary | ICD-10-CM | POA: Diagnosis present

## 2016-07-31 DIAGNOSIS — Z9889 Other specified postprocedural states: Secondary | ICD-10-CM

## 2016-07-31 DIAGNOSIS — C799 Secondary malignant neoplasm of unspecified site: Secondary | ICD-10-CM

## 2016-07-31 DIAGNOSIS — J9691 Respiratory failure, unspecified with hypoxia: Secondary | ICD-10-CM | POA: Diagnosis present

## 2016-07-31 DIAGNOSIS — C7951 Secondary malignant neoplasm of bone: Secondary | ICD-10-CM | POA: Diagnosis not present

## 2016-07-31 DIAGNOSIS — Z170 Estrogen receptor positive status [ER+]: Secondary | ICD-10-CM | POA: Diagnosis not present

## 2016-07-31 DIAGNOSIS — E119 Type 2 diabetes mellitus without complications: Secondary | ICD-10-CM

## 2016-07-31 DIAGNOSIS — C7931 Secondary malignant neoplasm of brain: Secondary | ICD-10-CM | POA: Diagnosis present

## 2016-07-31 DIAGNOSIS — C50919 Malignant neoplasm of unspecified site of unspecified female breast: Secondary | ICD-10-CM

## 2016-07-31 DIAGNOSIS — F1120 Opioid dependence, uncomplicated: Secondary | ICD-10-CM | POA: Diagnosis present

## 2016-07-31 DIAGNOSIS — Z9221 Personal history of antineoplastic chemotherapy: Secondary | ICD-10-CM

## 2016-07-31 DIAGNOSIS — G893 Neoplasm related pain (acute) (chronic): Secondary | ICD-10-CM | POA: Diagnosis present

## 2016-07-31 DIAGNOSIS — C7802 Secondary malignant neoplasm of left lung: Secondary | ICD-10-CM | POA: Diagnosis not present

## 2016-07-31 DIAGNOSIS — Z801 Family history of malignant neoplasm of trachea, bronchus and lung: Secondary | ICD-10-CM | POA: Diagnosis not present

## 2016-07-31 DIAGNOSIS — F1129 Opioid dependence with unspecified opioid-induced disorder: Secondary | ICD-10-CM

## 2016-07-31 DIAGNOSIS — R05 Cough: Secondary | ICD-10-CM | POA: Diagnosis present

## 2016-07-31 LAB — CBC WITH DIFFERENTIAL/PLATELET
BASOS PCT: 0 %
Band Neutrophils: 5 %
Basophils Absolute: 0 10*3/uL (ref 0.0–0.1)
EOS ABS: 0 10*3/uL (ref 0.0–0.7)
EOS PCT: 0 %
HCT: 36 % (ref 36.0–46.0)
Hemoglobin: 12 g/dL (ref 12.0–15.0)
Lymphocytes Relative: 24 %
Lymphs Abs: 1.5 10*3/uL (ref 0.7–4.0)
MCH: 29.9 pg (ref 26.0–34.0)
MCHC: 33.3 g/dL (ref 30.0–36.0)
MCV: 89.6 fL (ref 78.0–100.0)
Monocytes Absolute: 0.2 10*3/uL (ref 0.1–1.0)
Monocytes Relative: 3 %
NEUTROS ABS: 4.6 10*3/uL (ref 1.7–7.7)
NEUTROS PCT: 68 %
NRBC: 3 /100{WBCs} — AB
PLATELETS: 204 10*3/uL (ref 150–400)
RBC: 4.02 MIL/uL (ref 3.87–5.11)
RDW: 19.7 % — ABNORMAL HIGH (ref 11.5–15.5)
WBC: 6.3 10*3/uL (ref 4.0–10.5)

## 2016-07-31 LAB — BASIC METABOLIC PANEL
ANION GAP: 10 (ref 5–15)
BUN: 13 mg/dL (ref 6–20)
CO2: 29 mmol/L (ref 22–32)
Calcium: 8.9 mg/dL (ref 8.9–10.3)
Chloride: 98 mmol/L — ABNORMAL LOW (ref 101–111)
Creatinine, Ser: 0.57 mg/dL (ref 0.44–1.00)
GLUCOSE: 213 mg/dL — AB (ref 65–99)
POTASSIUM: 4.3 mmol/L (ref 3.5–5.1)
SODIUM: 137 mmol/L (ref 135–145)

## 2016-07-31 LAB — GLUCOSE, CAPILLARY
Glucose-Capillary: 394 mg/dL — ABNORMAL HIGH (ref 65–99)
Glucose-Capillary: 101 mg/dL — ABNORMAL HIGH (ref 65–99)
Glucose-Capillary: 368 mg/dL — ABNORMAL HIGH (ref 65–99)

## 2016-07-31 MED ORDER — DIPHENHYDRAMINE HCL 25 MG PO CAPS: 25.0000 mg | ORAL_CAPSULE | Freq: Four times a day (QID) | ORAL | Status: DC | PRN

## 2016-07-31 MED ORDER — HYDROMORPHONE HCL 2 MG/ML IJ SOLN
2.0000 mg | Freq: Once | INTRAMUSCULAR | Status: AC
Start: 2016-07-31 — End: 2016-07-31
  Administered 2016-07-31: 2 mg via INTRAVENOUS
  Filled 2016-07-31: qty 1

## 2016-07-31 MED ORDER — ONDANSETRON HCL 4 MG/2ML IJ SOLN
4.0000 mg | Freq: Once | INTRAMUSCULAR | Status: AC
  Administered 2016-07-31: 4 mg via INTRAVENOUS
  Filled 2016-07-31: qty 2

## 2016-07-31 MED ORDER — HYDROMORPHONE HCL 2 MG/ML IJ SOLN
2.0000 mg | Freq: Once | INTRAMUSCULAR | Status: AC
  Administered 2016-07-31: 2 mg via INTRAVENOUS
  Filled 2016-07-31: qty 1

## 2016-07-31 MED ORDER — ACETAMINOPHEN 650 MG RE SUPP: 650.0000 mg | Freq: Four times a day (QID) | RECTAL | Status: DC | PRN

## 2016-07-31 MED ORDER — SODIUM CHLORIDE 0.9% FLUSH
3.0000 mL | Freq: Two times a day (BID) | INTRAVENOUS | Status: DC
  Administered 2016-07-31 – 2016-08-02 (×5): 3 mL via INTRAVENOUS

## 2016-07-31 MED ORDER — SODIUM CHLORIDE 0.9% FLUSH: 3.0000 mL | INTRAVENOUS | Status: DC | PRN

## 2016-07-31 MED ORDER — IPRATROPIUM-ALBUTEROL 0.5-2.5 (3) MG/3ML IN SOLN: 3.0000 mL | RESPIRATORY_TRACT | Status: DC | PRN

## 2016-07-31 MED ORDER — HYDROMORPHONE HCL 1 MG/ML IJ SOLN
0.5000 mg | INTRAMUSCULAR | Status: AC | PRN
  Administered 2016-07-31 – 2016-08-01 (×5): 0.5 mg via INTRAVENOUS
  Filled 2016-07-31 (×5): qty 1

## 2016-07-31 MED ORDER — MIRTAZAPINE 30 MG PO TABS
30.0000 mg | ORAL_TABLET | Freq: Every day | ORAL | Status: DC
  Administered 2016-07-31 – 2016-08-01 (×2): 30 mg via ORAL
  Filled 2016-07-31 (×2): qty 1

## 2016-07-31 MED ORDER — ALBUTEROL SULFATE (2.5 MG/3ML) 0.083% IN NEBU: 2.5000 mg | INHALATION_SOLUTION | RESPIRATORY_TRACT | Status: DC | PRN

## 2016-07-31 MED ORDER — FUROSEMIDE 10 MG/ML IJ SOLN
40.0000 mg | Freq: Once | INTRAMUSCULAR | Status: AC
  Administered 2016-07-31: 40 mg via INTRAVENOUS
  Filled 2016-07-31: qty 4

## 2016-07-31 MED ORDER — INSULIN ASPART 100 UNIT/ML ~~LOC~~ SOLN
0.0000 [IU] | Freq: Three times a day (TID) | SUBCUTANEOUS | Status: DC
  Administered 2016-07-31: 20 [IU] via SUBCUTANEOUS
  Administered 2016-08-01: 11 [IU] via SUBCUTANEOUS
  Administered 2016-08-01: 15 [IU] via SUBCUTANEOUS
  Administered 2016-08-02: 20 [IU] via SUBCUTANEOUS
  Administered 2016-08-02: 11 [IU] via SUBCUTANEOUS
  Administered 2016-08-02: 15 [IU] via SUBCUTANEOUS

## 2016-07-31 MED ORDER — INSULIN ASPART 100 UNIT/ML ~~LOC~~ SOLN
0.0000 [IU] | Freq: Every day | SUBCUTANEOUS | Status: DC
  Administered 2016-07-31 – 2016-08-01 (×2): 5 [IU] via SUBCUTANEOUS

## 2016-07-31 MED ORDER — DOCUSATE SODIUM 100 MG PO CAPS
200.0000 mg | ORAL_CAPSULE | Freq: Two times a day (BID) | ORAL | Status: DC
  Administered 2016-07-31 – 2016-08-02 (×5): 200 mg via ORAL
  Filled 2016-07-31 (×5): qty 2

## 2016-07-31 MED ORDER — LETROZOLE 2.5 MG PO TABS
2.5000 mg | ORAL_TABLET | Freq: Every day | ORAL | Status: DC
  Administered 2016-07-31 – 2016-08-02 (×3): 2.5 mg via ORAL
  Filled 2016-07-31 (×3): qty 1

## 2016-07-31 MED ORDER — SODIUM CHLORIDE 0.9 % IV SOLN
Freq: Once | INTRAVENOUS | Status: AC
  Administered 2016-07-31: 04:00:00 via INTRAVENOUS

## 2016-07-31 MED ORDER — HYDROMORPHONE HCL 1 MG/ML IJ SOLN
1.0000 mg | INTRAMUSCULAR | Status: DC | PRN
  Administered 2016-07-31: 1 mg via INTRAVENOUS
  Filled 2016-07-31: qty 1

## 2016-07-31 MED ORDER — HYDROMORPHONE HCL 1 MG/ML IJ SOLN
0.5000 mg | INTRAMUSCULAR | Status: DC | PRN
Start: 2016-07-31 — End: 2016-07-31

## 2016-07-31 MED ORDER — NALOXONE HCL 0.4 MG/ML IJ SOLN: 0.4000 mg | INTRAMUSCULAR | Status: DC | PRN

## 2016-07-31 MED ORDER — IOPAMIDOL (ISOVUE-370) INJECTION 76%
100.0000 mL | Freq: Once | INTRAVENOUS | Status: AC | PRN
  Administered 2016-07-31: 100 mL via INTRAVENOUS

## 2016-07-31 MED ORDER — LORAZEPAM 0.5 MG PO TABS
0.5000 mg | ORAL_TABLET | Freq: Three times a day (TID) | ORAL | Status: DC | PRN
Start: 2016-07-31 — End: 2016-08-01
  Administered 2016-08-01: 0.5 mg via ORAL
  Filled 2016-07-31: qty 1

## 2016-07-31 MED ORDER — IPRATROPIUM-ALBUTEROL 0.5-2.5 (3) MG/3ML IN SOLN
3.0000 mL | Freq: Four times a day (QID) | RESPIRATORY_TRACT | Status: DC
  Administered 2016-07-31 – 2016-08-01 (×4): 3 mL via RESPIRATORY_TRACT
  Filled 2016-07-31 (×5): qty 3

## 2016-07-31 MED ORDER — ALBUTEROL SULFATE (2.5 MG/3ML) 0.083% IN NEBU
2.5000 mg | INHALATION_SOLUTION | Freq: Once | RESPIRATORY_TRACT | Status: AC
  Administered 2016-07-31: 2.5 mg via RESPIRATORY_TRACT
  Filled 2016-07-31: qty 3

## 2016-07-31 MED ORDER — METHADONE HCL 10 MG PO TABS
10.0000 mg | ORAL_TABLET | Freq: Three times a day (TID) | ORAL | Status: DC
  Administered 2016-07-31 – 2016-08-02 (×7): 10 mg via ORAL
  Filled 2016-07-31 (×7): qty 1

## 2016-07-31 MED ORDER — ENOXAPARIN SODIUM 40 MG/0.4ML ~~LOC~~ SOLN
40.0000 mg | SUBCUTANEOUS | Status: DC
  Administered 2016-07-31 – 2016-08-01 (×2): 40 mg via SUBCUTANEOUS
  Filled 2016-07-31 (×2): qty 0.4

## 2016-07-31 MED ORDER — ONDANSETRON HCL 4 MG/2ML IJ SOLN: 4.0000 mg | Freq: Three times a day (TID) | INTRAMUSCULAR | Status: AC | PRN

## 2016-07-31 MED ORDER — POTASSIUM CHLORIDE CRYS ER 20 MEQ PO TBCR
40.0000 meq | EXTENDED_RELEASE_TABLET | Freq: Once | ORAL | Status: AC
  Administered 2016-07-31: 40 meq via ORAL
  Filled 2016-07-31: qty 2

## 2016-07-31 MED ORDER — INSULIN ASPART 100 UNIT/ML ~~LOC~~ SOLN
4.0000 [IU] | Freq: Three times a day (TID) | SUBCUTANEOUS | Status: DC
  Administered 2016-07-31 – 2016-08-01 (×4): 4 [IU] via SUBCUTANEOUS

## 2016-07-31 MED ORDER — DEXAMETHASONE 4 MG PO TABS
4.0000 mg | ORAL_TABLET | Freq: Three times a day (TID) | ORAL | Status: DC
  Administered 2016-07-31 – 2016-08-02 (×7): 4 mg via ORAL
  Filled 2016-07-31 (×7): qty 1

## 2016-07-31 MED ORDER — PALBOCICLIB 125 MG PO CAPS: 125.0000 mg | ORAL_CAPSULE | Freq: Every day | ORAL | Status: DC

## 2016-07-31 MED ORDER — ACETAMINOPHEN 325 MG PO TABS: 650.0000 mg | ORAL_TABLET | Freq: Four times a day (QID) | ORAL | Status: DC | PRN

## 2016-07-31 MED ORDER — GLUCERNA SHAKE PO LIQD
237.0000 mL | Freq: Three times a day (TID) | ORAL | Status: DC
  Administered 2016-07-31 – 2016-08-02 (×7): 237 mL via ORAL
  Filled 2016-07-31 (×9): qty 237

## 2016-07-31 MED ORDER — SODIUM CHLORIDE 0.9 % IV SOLN: 250.0000 mL | INTRAVENOUS | Status: DC | PRN

## 2016-07-31 MED ORDER — PROCHLORPERAZINE MALEATE 10 MG PO TABS: 10.0000 mg | ORAL_TABLET | Freq: Four times a day (QID) | ORAL | Status: DC | PRN

## 2016-07-31 MED ORDER — LIP MEDEX EX OINT
TOPICAL_OINTMENT | CUTANEOUS | Status: AC
  Administered 2016-07-31: 13:00:00
  Filled 2016-07-31: qty 7

## 2016-07-31 NOTE — H&P (Addendum)
History and Physical  LORYNN MOESER LOV:[id number] DOB: Jul 31, 1966 DOA: 07/31/2016  Referring physician: Betsey Holiday PCP: [name], MD  Outpatient Specialists: [facility]  Chief Complaint: generalized pain, SOB  HPI: Laura Matthews is a 50 y.o. female with a history of metastatic breast cancer which has spread to her lungs, brain and bone who presents to the Emergency Department complaining of persistent generalized pain onset 3 days ago. She has been opioid dependent but there has been difficulty managing her pain outpatient because as noted by Dr. [name] in last note patient had not been following the pain management agreement appropriately with multiple excuses of losing her pills, pill counts not correct and chaotic home situation.  She was started on methadone 10 mg TID on 07/27/16 by Dr. [name].  She reports today that nothing helps her pain.  She has been taking her pain medication without improvement.  She reports that she has had progressively worsening and persistent cough. She is very short of breath.  Her chest xray confirms worsening metastatic disease.  She denies fever and chills.  She reports cough nonproductive.  Patient reports that she gets extremely short of breath when she stands and walks. She is having trouble even getting to the bathroom because of her difficulty breathing.  She was noted to be severely hypoxic when ambulating and her pulse drops into the 70s.  She is being admitted for further management.    Review of Systems: All systems reviewed and apart from history of presenting illness, are negative.  Past Medical History:  Diagnosis Date  . Anemia    low iron  . Arthritis   . Depression    pt denies   . Family history of malignant neoplasm of breast   . Head ache   . Hot flashes   . Lymphedema of breast    left  . Malignant neoplasm of breast (female), unspecified site    left breast  . Mental disorder    depression-  . Newly  diagnosed diabetes 11/12/2014   chemo increasing blood glucose; not on meds now  . Radiation 04/22/15-06/23/15   Left Breast   Past Surgical History:  Procedure Laterality Date  . BREAST LUMPECTOMY WITH SENTINEL LYMPH NODE BIOPSY Left 06/11/2014   Procedure: BREAST LUMPECTOMY WITH SENTINEL LYMPH NODE BX, POSSIBLE AXILLARY LYMPH NODE DISSECTION;  Surgeon: Stark Klein, MD;  Location: [place];  Service: General;  Laterality: Left;  . EVACUATION BREAST HEMATOMA Left 07/10/2014   Procedure: EVACUATION OF LEFT BREAST HEMATOMA ;  Surgeon: Stark Klein, MD;  Location: [place];  Service: General;  Laterality: Left;  . FOOT SURGERY  2011   rt  . INCISION AND DRAINAGE Left 07/02/2014   Procedure: Drainage left breast seroma;  Surgeon: Stark Klein, MD;  Location: [place];  Service: General;  Laterality: Left;  . INCISION AND DRAINAGE ABSCESS N/A 11/12/2014   Procedure: ASPIRATION AND INCISION AND DRAINAGE LEFT BREAST ABSCESS;  Surgeon: Michael Boston, MD;  Location[facility];  Service: General;  Laterality: N/A;  . [place]   right  . PORT-A-CATH REMOVAL Left 07/22/2015   Procedure: REMOVAL PORT-A-CATH;  Surgeon: Stark Klein, MD;  Location: [place];  Service: General;  Laterality: Left;  . PORTACATH PLACEMENT N/A 07/02/2014   Procedure: INSERTION PORT-A-CATH;  Surgeon: Stark Klein, MD;  Location: [place];  Service: General;  Laterality: N/A;  . TUBAL LIGATION    . UTERINE  FIBROID SURGERY  2005   ablation   Social History:  reports that she has quit smoking. Her smoking use included Cigarettes. She has a 5.00 pack-year smoking history. She has never used smokeless tobacco. She reports that she uses drugs, including Oxycodone and Other-see comments. She reports that she does not drink alcohol.   Allergies  Allergen Reactions  . Hydrocodone Itching and Nausea Only    Family History  Problem Relation Age of Onset  . Breast  cancer Mother 62    currently 17; TAH/BSO d/t ?cervical ca at 57  . Lung cancer Father     deceased 65  . Breast cancer Maternal Aunt     dx 72s; currently in her late 41s  . Diabetes Neg Hx    Prior to Admission medications   Medication Sig Start Date End Date Taking? Authorizing Provider  albuterol (PROVENTIL HFA;VENTOLIN HFA) 108 (90 Base) MCG/ACT inhaler Inhale 2 puffs into the lungs every 6 (six) hours as needed for wheezing or shortness of breath. 07/25/16  Yes Chauncey Cruel, MD  dexamethasone (DECADRON) 4 MG tablet Take 1 tablet (4 mg total) by mouth every 8 (eight) hours. 07/15/16  Yes Chauncey Cruel, MD  feeding supplement, GLUCERNA SHAKE, (GLUCERNA SHAKE) LIQD Take 237 mLs by mouth 3 (three) times daily between meals. 07/15/16  Yes Chauncey Cruel, MD  glipiZIDE (GLUCOTROL XL) 5 MG 24 hr tablet Take 2 tablets (10 mg total) by mouth daily with breakfast. 07/25/16  Yes Chauncey Cruel, MD  letrozole [facility]) 2.5 MG tablet Take 1 tablet (2.5 mg total) by mouth daily. 07/25/16  Yes Chauncey Cruel, MD  LORazepam (ATIVAN) 0.5 MG tablet Take 1 tablet (0.5 mg total) by mouth every 8 (eight) hours as needed for anxiety. 07/15/16  Yes Chauncey Cruel, MD  methadone (DOLOPHINE) 10 MG tablet Take 1 tablet (10 mg total) by mouth every 8 (eight) hours. 07/25/16  Yes Chauncey Cruel, MD  mirtazapine (REMERON) 30 MG tablet Take 1 tablet (30 mg total) by mouth at bedtime. 07/25/16  Yes Chauncey Cruel, MD  ondansetron (ZOFRAN ODT) 4 MG disintegrating tablet Take 1 tablet (4 mg total) by mouth every 8 (eight) hours as needed for nausea or vomiting. 07/25/16  Yes Chauncey Cruel, MD  palbociclib [facility]) 125 MG capsule Take 1 capsule (125 mg total) by mouth daily with breakfast. Take whole with food. 07/15/16  Yes Chauncey Cruel, MD  prochlorperazine (COMPAZINE) 10 MG tablet Take 1 tablet (10 mg total) by mouth every 6 (six) hours as needed for nausea or vomiting. 07/25/16  Yes Chauncey Cruel, MD   Physical Exam: Vitals:   07/31/16 0536 07/31/16 0545 07/31/16 0630 07/31/16 0821  BP: 140/90  133/87 125/88  Pulse: 110  102 87  Resp: '20  18 16  '$ Temp:    98.4 F (36.9 C)  TempSrc:    Oral  SpO2: (S) (!) 84% (S) 94% 92% 98%  Weight:    69.4 kg (153 lb)    Constitutional: She is oriented to person, place, and time. She appears well-developed and well-nourished. No distress.  HENT: Head: Normocephalic and atraumatic.  Nose: Nose normal.  Mouth/Throat: Oropharynx is clear and moist and mucous membranes are normal.  Eyes: Conjunctivae and EOM are normal. Pupils are equal, round, and reactive to light.  Neck: Normal range of motion. Neck supple.  Cardiovascular: Regular rhythm, S1 normal and S2 normal.  Exam reveals no gallop and no friction  rub.  No murmur heard. Pulmonary/Chest: Effort normal and breath sounds normal. No respiratory distress. She exhibits no tenderness.  Abdominal: Soft. Normal appearance and bowel sounds are normal. There is no hepatosplenomegaly. There is no tenderness. There is no rebound, no guarding, no tenderness at McBurney's point and negative Murphy's sign. No hernia.  Musculoskeletal: Normal range of motion.  Neurological: She is alert and oriented to person, place, and time. She has normal strength. No cranial nerve deficit or sensory deficit. Coordination normal.    Skin: Skin is warm, dry and intact. No rash noted. No cyanosis.  Psychiatric: She has a normal mood and affect. Her speech is normal and behavior is normal. Thought content normal.   Labs on Admission:  Basic Metabolic Panel:  Recent Labs Lab 07/25/16 1017 07/31/16 0406  NA 137 137  K 4.1 4.3  CL  --  98*  CO2 23 29  GLUCOSE 292* 213*  BUN 13.1 13  CREATININE 0.7 0.57  CALCIUM 9.0 8.9   Liver Function Tests:  Recent Labs Lab 07/25/16 1017  AST 19  ALT 35  ALKPHOS 123  BILITOT <0.30  PROT 7.0  ALBUMIN 3.2*   No results for input(s): LIPASE, AMYLASE in the  last 168 hours. No results for input(s): AMMONIA in the last 168 hours. CBC:  Recent Labs Lab 07/25/16 1017 07/31/16 0406  WBC 4.5 6.3  NEUTROABS 3.2 4.6  HGB 11.9 12.0  HCT 35.4 36.0  MCV 88.3 89.6  PLT 194 204   Cardiac Enzymes: No results for input(s): CKTOTAL, CKMB, CKMBINDEX, TROPONINI in the last 168 hours.  BNP (last 3 results) No results for input(s): PROBNP in the last 8760 hours. CBG: No results for input(s): GLUCAP in the last 168 hours.  Radiological Exams on Admission: Dg Chest Port 1 View  Result Date: 07/31/2016 CLINICAL DATA:  Cough since June. History of metastatic breast cancer to lungs, brain, and bone. Generalized pain for 3 days. History of diabetes. EXAM: PORTABLE CHEST 1 VIEW COMPARISON:  CT chest 07/09/2016.  Chest 07/09/2016. FINDINGS: Shallow inspiration. Normal heart size and pulmonary vascularity. Patchy infiltrates demonstrated throughout both lungs demonstrating progression since previous study. This could represent edema or multifocal pneumonia. Interstitial metastatic spread not excluded. No blunting of costophrenic angles. No pneumothorax. Surgical clips in the left axilla. IMPRESSION: Progressing interstitial infiltrates throughout both lungs since previous study. No focal consolidation. Electronically Signed   By: Lucienne Capers M.D.   On: 07/31/2016 05:36   EKG: Independently reviewed.   Assessment/Plan Active Problems:   Opiate dependence (HCC)   Breast cancer of upper-outer quadrant of left female breast ([place])   Diabetes mellitus without complication (HCC)   Protein-calorie malnutrition, moderate (HCC)   Cough   Hypoxia   Lung metastases (HCC)   Shortness of breath   Brain metastasis (HCC)   Respiratory failure with hypoxia (HCC)   Acute on chronic respiratory failure with hypoxia (HCC)   Metastatic breast cancer ([place])   1. Metastatic breast cancer - resume home ibrance and femara, resume methadone 10 mg TID for pain control.    2. Acute on chronic respiratory failure - Continue supplemental oxygen, likely will need home O2, consulted care manager, check CTA chest to rule out acute PE. Schedule duonebs.  Continue dexamethasone.  3. Opioid dependence / Chronic cancer pain uncontrolled - Resume maintenance methadone 10 mg TID, added dilaudid 0.5 mg IV for breakthrough pain, monitor respiratory status closely, naloxone ordered as needed.   4. Moderate protein calorie malnutrition -  Pt eating well at this time, she ate entire breakfast and kept it down with no problems.  5. Diabetes Mellitus type 2, uncontrolled - likely exacerbated by scheduled dexamethasone, ordered for prandial insulin coverage, sliding scale supplemental insulin and frequent accu-checks.    DVT Prophylaxis: lovenox Code Status: full   Family Communication: no family at bedside  Disposition Plan: TBD   Time spent: 45 mins  Irwin Brakeman, MD [facility] Pager [phone]  If 7PM-7AM, please contact night-coverage [email or web address] Password [email or web address] 07/31/2016, 8:58 AM

## 2016-07-31 NOTE — ED Provider Notes (Signed)
[facility] DEPT Provider Note   CSN: [id number] Arrival date & time: 07/31/16  0307  By signing my name below, I, Royce Macadamia, attest that this documentation has been prepared under the direction and in the presence of Orpah Greek, MD . Electronically Signed: Royce Macadamia, Scribe. 07/31/2016. 3:37 AM.  History   Chief Complaint Chief Complaint  Patient presents with  . generalized pain   The history is provided by the patient and medical records. No language interpreter was used.     HPI Comments:  KIAJA SHORTY is a 50 y.o. female with a history of metastatic breast cancer which has spread to her lungs, brain and bone who presents to the Emergency Department complaining of persistent generalized pain onset 3 days ago.  She notes the pain is worst in her back, knees and "creases."  Nothing helps her pain.  She has been taking her pain medication without improvement.  Also reports that she has had progressively worsening and persistent cough. She is very short of breath. Patient reports that she gets extremely short of breath when she stands and walks. She is having trouble even getting to the bathroom because of her difficulty breathing.  Past Medical History:  Diagnosis Date  . Anemia    low iron  . Arthritis   . Depression    pt denies   . Family history of malignant neoplasm of breast   . Head ache   . Hot flashes   . Lymphedema of breast    left  . Malignant neoplasm of breast (female), unspecified site    left breast  . Mental disorder    depression-  . Newly diagnosed diabetes 11/12/2014   chemo increasing blood glucose; not on meds now  . Radiation 04/22/15-06/23/15   Left Breast    Patient Active Problem List   Diagnosis Date Noted  . Cancer, metastatic to bone ([place]) 06/10/2016  . Brain metastasis ([place])   . Protein-calorie malnutrition, severe 06/09/2016  . Pleural effusion   . S/P thoracentesis   . Acute respiratory failure with  hypoxia ([place]) 06/07/2016  . Back pain 06/07/2016  . Protein-calorie malnutrition, moderate ([place]) 06/07/2016  . Cough   . Hypoxia   . Lung metastases ([place])   . Shortness of breath   . Bronchitis 05/25/2016  . Chest pain 02/02/2015  . Depression 01/26/2015  . Abscess of left breast 11/12/2014  . Diabetes mellitus without complication ([place]) 82/99/3716  . Neutropenia ([place]) 08/05/2014  . Lymphedema of breast 08/05/2014  . Breast cancer metastasized to axillary lymph node ([place]) 06/11/2014  . Family history of malignant neoplasm of breast   . Breast cancer of upper-outer quadrant of left female breast ([place]) 05/19/2014  . Opiate dependence ([place]) 05/01/2012    Class: Acute  . Homeless 05/01/2012    Class: Acute    Past Surgical History:  Procedure Laterality Date  . BREAST LUMPECTOMY WITH SENTINEL LYMPH NODE BIOPSY Left 06/11/2014   Procedure: BREAST LUMPECTOMY WITH SENTINEL LYMPH NODE BX, POSSIBLE AXILLARY LYMPH NODE DISSECTION;  Surgeon: Stark Klein, MD;  Location: [place];  Service: General;  Laterality: Left;  . EVACUATION BREAST HEMATOMA Left 07/10/2014   Procedure: EVACUATION OF LEFT BREAST HEMATOMA ;  Surgeon: Stark Klein, MD;  Location: [place];  Service: General;  Laterality: Left;  . FOOT SURGERY  2011   rt  . INCISION AND DRAINAGE Left 07/02/2014   Procedure: Drainage left breast seroma;  Surgeon: Stark Klein, MD;  Location: [place]  [place];  Service: General;  Laterality: Left;  . INCISION AND DRAINAGE ABSCESS N/A 11/12/2014   Procedure: ASPIRATION AND INCISION AND DRAINAGE LEFT BREAST ABSCESS;  Surgeon: Michael Boston, MD;  [facility];  Service: General;  Laterality: N/A;  . [place]   right  . PORT-A-CATH REMOVAL Left 07/22/2015   Procedure: REMOVAL PORT-A-CATH;  Surgeon: Stark Klein, MD;  Location: [place];  Service: General;  Laterality: Left;  . PORTACATH PLACEMENT N/A 07/02/2014   Procedure: INSERTION PORT-A-CATH;   Surgeon: Stark Klein, MD;  Location: [place];  Service: General;  Laterality: N/A;  . TUBAL LIGATION    . UTERINE FIBROID SURGERY  2005   ablation    OB History    Gravida Para Term Preterm AB Living   '3 3 3     3   '$ SAB TAB Ectopic Multiple Live Births           2       Home Medications    Prior to Admission medications   Medication Sig Start Date End Date Taking? Authorizing Provider  albuterol (PROVENTIL HFA;VENTOLIN HFA) 108 (90 Base) MCG/ACT inhaler Inhale 2 puffs into the lungs every 6 (six) hours as needed for wheezing or shortness of breath. 07/25/16  Yes Chauncey Cruel, MD  dexamethasone (DECADRON) 4 MG tablet Take 1 tablet (4 mg total) by mouth every 8 (eight) hours. 07/15/16  Yes Chauncey Cruel, MD  feeding supplement, GLUCERNA SHAKE, (GLUCERNA SHAKE) LIQD Take 237 mLs by mouth 3 (three) times daily between meals. 07/15/16  Yes Chauncey Cruel, MD  glipiZIDE (GLUCOTROL XL) 5 MG 24 hr tablet Take 2 tablets (10 mg total) by mouth daily with breakfast. 07/25/16  Yes Chauncey Cruel, MD  letrozole [facility]) 2.5 MG tablet Take 1 tablet (2.5 mg total) by mouth daily. 07/25/16  Yes Chauncey Cruel, MD  LORazepam (ATIVAN) 0.5 MG tablet Take 1 tablet (0.5 mg total) by mouth every 8 (eight) hours as needed for anxiety. 07/15/16  Yes Chauncey Cruel, MD  methadone (DOLOPHINE) 10 MG tablet Take 1 tablet (10 mg total) by mouth every 8 (eight) hours. 07/25/16  Yes Chauncey Cruel, MD  mirtazapine (REMERON) 30 MG tablet Take 1 tablet (30 mg total) by mouth at bedtime. 07/25/16  Yes Chauncey Cruel, MD  ondansetron (ZOFRAN ODT) 4 MG disintegrating tablet Take 1 tablet (4 mg total) by mouth every 8 (eight) hours as needed for nausea or vomiting. 07/25/16  Yes Chauncey Cruel, MD  palbociclib [facility]) 125 MG capsule Take 1 capsule (125 mg total) by mouth daily with breakfast. Take whole with food. 07/15/16  Yes Chauncey Cruel, MD  prochlorperazine (COMPAZINE) 10  MG tablet Take 1 tablet (10 mg total) by mouth every 6 (six) hours as needed for nausea or vomiting. 07/25/16  Yes Chauncey Cruel, MD    Family History Family History  Problem Relation Age of Onset  . Breast cancer Mother 6    currently 24; TAH/BSO d/t ?cervical ca at 9  . Lung cancer Father     deceased 18  . Breast cancer Maternal Aunt     dx 75s; currently in her late 78s  . Diabetes Neg Hx     Social History Social History  Substance Use Topics  . Smoking status: Former Smoker    Packs/day: 0.25    Years: 20.00    Types: Cigarettes  . Smokeless tobacco: Never  Used  . Alcohol use No     Comment: Percoset,Vicodin, Oxycotin     Allergies   Hydrocodone   Review of Systems Review of Systems  Constitutional: Negative for fever.  Musculoskeletal: Positive for arthralgias and myalgias.     Physical Exam Updated Vital Signs BP 140/90   Pulse 110   Temp 99.1 F (37.3 C) (Oral)   Resp 20   SpO2 (S) 94%   Physical Exam  Constitutional: She is oriented to person, place, and time. She appears well-developed and well-nourished. No distress.  HENT:  Head: Normocephalic and atraumatic.  Right Ear: Hearing normal.  Left Ear: Hearing normal.  Nose: Nose normal.  Mouth/Throat: Oropharynx is clear and moist and mucous membranes are normal.  Eyes: Conjunctivae and EOM are normal. Pupils are equal, round, and reactive to light.  Neck: Normal range of motion. Neck supple.  Cardiovascular: Regular rhythm, S1 normal and S2 normal.  Exam reveals no gallop and no friction rub.   No murmur heard. Pulmonary/Chest: Effort normal and breath sounds normal. No respiratory distress. She exhibits no tenderness.  Abdominal: Soft. Normal appearance and bowel sounds are normal. There is no hepatosplenomegaly. There is no tenderness. There is no rebound, no guarding, no tenderness at McBurney's point and negative Murphy's sign. No hernia.  Musculoskeletal: Normal range of motion.    Neurological: She is alert and oriented to person, place, and time. She has normal strength. No cranial nerve deficit or sensory deficit. Coordination normal. GCS eye subscore is 4. GCS verbal subscore is 5. GCS motor subscore is 6.  Skin: Skin is warm, dry and intact. No rash noted. No cyanosis.  Psychiatric: She has a normal mood and affect. Her speech is normal and behavior is normal. Thought content normal.  Nursing note and vitals reviewed.    ED Treatments / Results   DIAGNOSTIC STUDIES:  Oxygen Saturation is 92% on RA, low by my interpretation.    COORDINATION OF CARE:  3:37 AM Discussed treatment plan with pt at bedside and pt agreed to plan.  Labs (all labs ordered are listed, but only abnormal results are displayed) Labs Reviewed  CBC WITH DIFFERENTIAL/PLATELET - Abnormal; Notable for the following:       Result Value   RDW 19.7 (*)    nRBC 3 (*)    All other components within normal limits  BASIC METABOLIC PANEL - Abnormal; Notable for the following:    Chloride 98 (*)    Glucose, Bld 213 (*)    All other components within normal limits    EKG  EKG Interpretation None       Radiology Dg Chest Port 1 View  Result Date: 07/31/2016 CLINICAL DATA:  Cough since June. History of metastatic breast cancer to lungs, brain, and bone. Generalized pain for 3 days. History of diabetes. EXAM: PORTABLE CHEST 1 VIEW COMPARISON:  CT chest 07/09/2016.  Chest 07/09/2016. FINDINGS: Shallow inspiration. Normal heart size and pulmonary vascularity. Patchy infiltrates demonstrated throughout both lungs demonstrating progression since previous study. This could represent edema or multifocal pneumonia. Interstitial metastatic spread not excluded. No blunting of costophrenic angles. No pneumothorax. Surgical clips in the left axilla. IMPRESSION: Progressing interstitial infiltrates throughout both lungs since previous study. No focal consolidation. Electronically Signed   By: Lucienne Capers M.D.   On: 07/31/2016 05:36    Procedures Procedures (including critical care time)  Medications Ordered in ED Medications  0.9 %  sodium chloride infusion ( Intravenous New Bag/Given 07/31/16 0404)  HYDROmorphone (DILAUDID) injection 2 mg (2 mg Intravenous Given 07/31/16 0406)  ondansetron (ZOFRAN) injection 4 mg (4 mg Intravenous Given 07/31/16 0404)  HYDROmorphone (DILAUDID) injection 2 mg (2 mg Intravenous Given 07/31/16 0538)  albuterol (PROVENTIL) (2.5 MG/3ML) 0.083% nebulizer solution 2.5 mg (2.5 mg Nebulization Given 07/31/16 0548)     Initial Impression / Assessment and Plan / ED Course  I have reviewed the triage vital signs and the nursing notes.  Pertinent labs & imaging results that were available during my care of the patient were reviewed by me and considered in my medical decision making (see chart for details).  Clinical Course   Patient presents primarily for help with her pain from metastatic breast cancer. She was on analgesia at home but it is not helping. She was given multiple doses of Dilaudid and pain is improved.  Patient also, however, complaining of shortness of breath. She has had cough and significant dyspnea on exertion. Patient does have metastatic disease in her lungs. Chest x-ray shows progression of this disease. I did see the patient earlier this month with similar complaints. At that time her workup included CT angiography to rule out PE and there was no PE seen. Do not believe she requires repeat examination for this. The infiltrative process in her lungs explains her symptoms. Patient gets significantly hypoxic with ambulation. She walked a minimal distance in the hallway here and room air oxygen dropped to 70%. She does not have oxygen at home. She will therefore be admitted for further management.  Final Clinical Impressions(s) / ED Diagnoses   Final diagnoses:  Metastatic breast cancer ([place])  Acute on chronic respiratory failure with hypoxia  (HCC)    New Prescriptions New Prescriptions   No medications on file   I personally performed the services described in this documentation, which was scribed in my presence. The recorded information has been reviewed and is accurate.    Orpah Greek, MD 07/31/16 [phone]

## 2016-07-31 NOTE — ED Notes (Signed)
Pt verbalized shortness of breath on ambulation---- pt's O2 sat 77%-83% on room air while ambulating.

## 2016-07-31 NOTE — ED Triage Notes (Signed)
Patient states that has been in pain x4 days.  Patient states that she has "cancer everywhere" and has been in uncontrollable pain since Thursday.  Patient has been taking her regular medications without relief.  Patient rates pain 10/10.

## 2016-07-31 NOTE — ED Notes (Signed)
Report called to Legrand Rams, RN on  [address].

## 2016-08-01 DIAGNOSIS — Z7189 Other specified counseling: Secondary | ICD-10-CM

## 2016-08-01 DIAGNOSIS — Z515 Encounter for palliative care: Secondary | ICD-10-CM

## 2016-08-01 DIAGNOSIS — J9621 Acute and chronic respiratory failure with hypoxia: Secondary | ICD-10-CM

## 2016-08-01 LAB — CBC
HEMATOCRIT: 33.1 % — AB (ref 36.0–46.0)
HEMOGLOBIN: 10.9 g/dL — AB (ref 12.0–15.0)
MCH: 29.6 pg (ref 26.0–34.0)
MCHC: 32.9 g/dL (ref 30.0–36.0)
MCV: 89.9 fL (ref 78.0–100.0)
PLATELETS: 197 10*3/uL (ref 150–400)
RBC: 3.68 MIL/uL — ABNORMAL LOW (ref 3.87–5.11)
RDW: 19.6 % — ABNORMAL HIGH (ref 11.5–15.5)
WBC: 4.9 10*3/uL (ref 4.0–10.5)

## 2016-08-01 LAB — GLUCOSE, CAPILLARY
GLUCOSE-CAPILLARY: 340 mg/dL — AB (ref 65–99)
GLUCOSE-CAPILLARY: 283 mg/dL — AB (ref 65–99)
GLUCOSE-CAPILLARY: 268 mg/dL — AB (ref 65–99)
GLUCOSE-CAPILLARY: 397 mg/dL — AB (ref 65–99)

## 2016-08-01 LAB — BASIC METABOLIC PANEL
ANION GAP: 8 (ref 5–15)
BUN: 15 mg/dL (ref 6–20)
CO2: 30 mmol/L (ref 22–32)
Calcium: 8.5 mg/dL — ABNORMAL LOW (ref 8.9–10.3)
Chloride: 99 mmol/L — ABNORMAL LOW (ref 101–111)
Creatinine, Ser: 0.49 mg/dL (ref 0.44–1.00)
GFR calc Af Amer: >60 mL/min (ref >60)
GFR calc non Af Amer: >60 mL/min (ref >60)
GLUCOSE: 273 mg/dL — AB (ref 65–99)
POTASSIUM: 4.9 mmol/L (ref 3.5–5.1)
Sodium: 137 mmol/L (ref 135–145)

## 2016-08-01 MED ORDER — HYDROMORPHONE HCL 2 MG PO TABS
1.0000 mg | ORAL_TABLET | ORAL | Status: DC | PRN
  Administered 2016-08-01 – 2016-08-02 (×8): 1 mg via ORAL
  Filled 2016-08-01 (×8): qty 1

## 2016-08-01 MED ORDER — HYDROMORPHONE HCL 1 MG/ML IJ SOLN
0.5000 mg | Freq: Once | INTRAMUSCULAR | Status: AC
  Administered 2016-08-01: 0.5 mg via INTRAVENOUS

## 2016-08-01 MED ORDER — LIDOCAINE 5 % EX PTCH
1.0000 | MEDICATED_PATCH | CUTANEOUS | Status: DC
  Administered 2016-08-01 – 2016-08-02 (×2): 1 via TRANSDERMAL
  Filled 2016-08-01 (×2): qty 1

## 2016-08-01 MED ORDER — LORAZEPAM 0.5 MG PO TABS
0.5000 mg | ORAL_TABLET | ORAL | Status: DC | PRN
  Administered 2016-08-01: 0.5 mg via ORAL
  Filled 2016-08-01: qty 1

## 2016-08-01 MED ORDER — GLIPIZIDE ER 10 MG PO TB24
10.0000 mg | ORAL_TABLET | Freq: Every day | ORAL | Status: DC
  Administered 2016-08-01 – 2016-08-02 (×2): 10 mg via ORAL
  Filled 2016-08-01 (×2): qty 1

## 2016-08-01 MED ORDER — FUROSEMIDE 10 MG/ML IJ SOLN
40.0000 mg | Freq: Two times a day (BID) | INTRAMUSCULAR | Status: DC
  Administered 2016-08-01 (×2): 40 mg via INTRAVENOUS
  Filled 2016-08-01 (×2): qty 4

## 2016-08-01 MED ORDER — NAPROXEN 500 MG PO TABS
500.0000 mg | ORAL_TABLET | Freq: Two times a day (BID) | ORAL | Status: DC
  Administered 2016-08-01 – 2016-08-02 (×3): 500 mg via ORAL
  Filled 2016-08-01 (×3): qty 1

## 2016-08-01 MED ORDER — IPRATROPIUM-ALBUTEROL 0.5-2.5 (3) MG/3ML IN SOLN
3.0000 mL | Freq: Four times a day (QID) | RESPIRATORY_TRACT | Status: DC
  Administered 2016-08-01 (×3): 3 mL via RESPIRATORY_TRACT
  Filled 2016-08-01 (×3): qty 3

## 2016-08-01 MED ORDER — IPRATROPIUM-ALBUTEROL 0.5-2.5 (3) MG/3ML IN SOLN
3.0000 mL | Freq: Three times a day (TID) | RESPIRATORY_TRACT | Status: DC
  Filled 2016-08-01: qty 3

## 2016-08-01 MED ORDER — PANTOPRAZOLE SODIUM 20 MG PO TBEC
20.0000 mg | DELAYED_RELEASE_TABLET | Freq: Every day | ORAL | Status: DC
Start: 2016-08-01 — End: 2016-08-02
  Administered 2016-08-01 – 2016-08-02 (×2): 20 mg via ORAL
  Filled 2016-08-01 (×2): qty 1

## 2016-08-01 MED ORDER — HYDROMORPHONE HCL 1 MG/ML IJ SOLN
INTRAMUSCULAR | Status: AC
  Filled 2016-08-01: qty 1

## 2016-08-01 MED ORDER — BENZONATATE 100 MG PO CAPS
100.0000 mg | ORAL_CAPSULE | Freq: Three times a day (TID) | ORAL | Status: DC | PRN
Start: 2016-08-01 — End: 2016-08-02
  Administered 2016-08-01: 100 mg via ORAL
  Filled 2016-08-01: qty 1

## 2016-08-01 NOTE — Progress Notes (Signed)
PROGRESS NOTE                                                                                                                                                                                                             Patient Demographics:    Laura Matthews, is a 50 y.o. female, DOB - 1966/08/28, TDD:[id number]  Admit date - 07/31/2016   Admitting Physician Vianne Bulls, MD  Outpatient Primary MD for the patient is [name], MD  LOS - 1  Outpatient Specialists: Oncology dr [name]  Chief Complaint  Patient presents with  . generalized pain       Brief Narrative   50 y.o. female with a history of metastatic breast cancer which has spread to her lungs, brain and bone who presents to the Emergency Department With dyspnea, found to have acute hypoxic respiratory failure secondary to volume overload .   Subjective:    Laura Matthews today has, No headache, No chest pain, No abdominal pain - Ports dyspnea at baseline, but complains of her chronic pain  Assessment  & Plan :    Active Problems:   Opiate dependence (HCC)   Breast cancer of upper-outer quadrant of left female breast ([place])   Diabetes mellitus without complication (HCC)   Protein-calorie malnutrition, moderate (HCC)   Cough   Hypoxia   Lung metastases (HCC)   Shortness of breath   Brain metastasis (HCC)   Respiratory failure with hypoxia (HCC)   Acute on chronic respiratory failure with hypoxia (HCC)   Metastatic breast cancer ([place])   Acute hypoxic respiratory failure - Patient hypoxic, requiring 4 L nasal cannula currently, CTA chest negative for PE, but significant for volume overload, recent echo showing grade 1 diastolic dysfunction. - Continue with oxygen as needed, wean as tolerated  Acute on chronic diastolic CHF - 2-D echo 04/2375 with EF 28%, grade 1 diastolic dysfunction - Evidence of volume overload on CTA chest, start on IV diuresis, continue with strict  ins and outs and daily weight  Metastatic breast cancer - resume home ibrance and femara, followed by Dr. [name] as an outpatient  Opioid dependence / Chronic cancer pain uncontrolled  - Resume maintenance methadone 10 mg TID, added dilaudid 1 mg oral every 3 hours for breakthrough pain, palliative medicine consulted to assist with pain control .  Type 2  diabetes mellitus - Uncontrolled, will resume back on home dose glipizide XL, as well Continue with insulin sliding scale  Code Status : Full  Family Communication  : D/W patient   Disposition Plan  : Home when stable  Consults  :  none  Procedures  : none  DVT Prophylaxis  :  Lovenox   Lab Results  Component Value Date   PLT 197 08/01/2016    Antibiotics  :   Anti-infectives    None        Objective:   Vitals:   08/01/16 0536 08/01/16 0740 08/01/16 0940 08/01/16 0944  BP: 123/77     Pulse: 82     Resp: 18     Temp: 98.9 F (37.2 C)     TempSrc: Oral     SpO2: 99% 95% (!) 87% 100%  Weight:      Height:        Wt Readings from Last 3 Encounters:  07/31/16 69.4 kg (153 lb)  07/15/16 70.6 kg (155 lb 9.6 oz)  07/01/16 69.5 kg (153 lb 3.2 oz)     Intake/Output Summary (Last 24 hours) at 08/01/16 1059 Last data filed at 07/31/16 2210  Gross per 24 hour  Intake             1730 ml  Output                0 ml  Net             1730 ml     Physical Exam  Awake Alert, Oriented X 3, Supple Neck,No JVD,  Symmetrical Chest wall movement, Good air movement bilaterally, no wheezing RRR,No Gallops,Rubs or new Murmurs, No Parasternal Heave +ve B.Sounds, Abd Soft, No tenderness, No rebound - guarding or rigidity. No Cyanosis, Clubbing or edema, No new Rash or bruise      Data Review:    CBC  Recent Labs Lab 07/31/16 0406 08/01/16 0413  WBC 6.3 4.9  HGB 12.0 10.9*  HCT 36.0 33.1*  PLT 204 197  MCV 89.6 89.9  MCH 29.9 29.6  MCHC 33.3 32.9  RDW 19.7* 19.6*  LYMPHSABS 1.5  --   MONOABS 0.2  --    EOSABS 0.0  --   BASOSABS 0.0  --     Chemistries   Recent Labs Lab 07/31/16 0406 08/01/16 0413  NA 137 137  K 4.3 4.9  CL 98* 99*  CO2 29 30  GLUCOSE 213* 273*  BUN 13 15  CREATININE 0.57 0.49  CALCIUM 8.9 8.5*   ------------------------------------------------------------------------------------------------------------------ No results for input(s): CHOL, HDL, LDLCALC, TRIG, CHOLHDL, LDLDIRECT in the last 72 hours.  Lab Results  Component Value Date   HGBA1C 8.5 (H) 07/01/2015   ------------------------------------------------------------------------------------------------------------------ No results for input(s): TSH, T4TOTAL, T3FREE, THYROIDAB in the last 72 hours.  Invalid input(s): FREET3 ------------------------------------------------------------------------------------------------------------------ No results for input(s): VITAMINB12, FOLATE, FERRITIN, TIBC, IRON, RETICCTPCT in the last 72 hours.  Coagulation profile No results for input(s): INR, PROTIME in the last 168 hours.  No results for input(s): DDIMER in the last 72 hours.  Cardiac Enzymes No results for input(s): CKMB, TROPONINI, MYOGLOBIN in the last 168 hours.  Invalid input(s): CK ------------------------------------------------------------------------------------------------------------------    Component Value Date/Time   BNP 21.9 07/09/2016 0033    Inpatient Medications  Scheduled Meds: . dexamethasone  4 mg Oral Q8H  . docusate sodium  200 mg Oral BID  . enoxaparin (LOVENOX) injection  40 mg Subcutaneous Q24H  . feeding  supplement (GLUCERNA SHAKE)  237 mL Oral TID BM  . furosemide  40 mg Intravenous BID  . insulin aspart  0-20 Units Subcutaneous TID WC  . insulin aspart  0-5 Units Subcutaneous QHS  . insulin aspart  4 Units Subcutaneous TID WC  . ipratropium-albuterol  3 mL Nebulization Q6H WA  . letrozole  2.5 mg Oral Daily  . methadone  10 mg Oral Q8H  . mirtazapine  30 mg  Oral QHS  . palbociclib  125 mg Oral Q breakfast  . sodium chloride flush  3 mL Intravenous Q12H   Continuous Infusions:  PRN Meds:.sodium chloride, acetaminophen **OR** acetaminophen, benzonatate, diphenhydrAMINE, HYDROmorphone, ipratropium-albuterol, LORazepam, naLOXone (NARCAN)  injection, prochlorperazine, sodium chloride flush  Micro Results No results found for this or any previous visit (from the past 240 hour(s)).  Radiology Reports Dg Chest 2 View  Result Date: 07/09/2016 CLINICAL DATA:  50 year old female with shortness of breath. History of lung and breast cancers. EXAM: CHEST  2 VIEW COMPARISON:  Chest radiograph dated 06/09/2016 FINDINGS: Two views of the chest demonstrate stop mild diffuse interstitial prominence with more nodular appearance in the mid to lower lung field. There has been interval improved appearance of the lung markings compared to the prior study. There is no focal consolidation, or pneumothorax. Probable trace bilateral pleural effusions. The cardiac silhouette is within normal limits. Left axillary surgical clips noted. No acute osseous pathology. IMPRESSION: Mild interstitial prominence and nodularity primarily involving the mid to lower lung field. Although findings may be related to mild vascular congestion, given interval improved appearance compared to the prior study, lymphangitic spread of tumor is not entirely excluded. Clinical correlation and follow-up recommended. No focal consolidation. Electronically Signed   By: Anner Crete M.D.   On: 07/09/2016 01:48   Ct Angio Chest Pe W Or Wo Contrast  Result Date: 07/31/2016 CLINICAL DATA:  She reports that she has had progressively worsening and persistent cough. She is very short of breath. Her chest xray confirms worsening metastatic disease. She denies fever and chills. She reports cough nonproductive. Patient reports that she gets extremely short of breath when she stands and walks. Hx of breast ca dx  05/2014 lt sided lumpectomy with chemo in process and XRT completed EXAM: CT ANGIOGRAPHY CHEST WITH CONTRAST TECHNIQUE: Multidetector CT imaging of the chest was performed using the standard protocol during bolus administration of intravenous contrast. Multiplanar CT image reconstructions and MIPs were obtained to evaluate the vascular anatomy. CONTRAST:  100 mL of Isovue 370 intravenous contrast COMPARISON:  Current chest radiograph.  CTA of the chest, 07/09/2016 FINDINGS: Cardiovascular: No evidence of a pulmonary embolism. Heart is normal in size and configuration. No significant coronary artery calcifications. Great vessels are normal in caliber. No aortic dissection. No atherosclerosis. Mediastinum/Nodes: No mediastinal or hilar masses. No discrete enlarged lymph nodes. Increased soft tissue attenuation along both hila. Trace pericardial fluid. Lungs/Pleura: Small bilateral pleural effusions. Patchy ground-glass opacities noted in both lungs, predominantly in the upper lobes. There is interstitial thickening that predominates in the lower lungs. No lung mass or nodule. No pneumothorax. Upper Abdomen: Hepatomegaly with extensive fatty infiltration of the liver. No acute findings. Musculoskeletal: Mild depressions of the upper endplates of T4 T6 and T9. Multiple mixed sclerotic and lytic lesions throughout the spine. There is a small seroma with associated vascular clips in the left breast. There is left breast skin thickening. Findings consistent with treatment for the known breast carcinoma. Findings are stable. Review of the MIP images  confirms the above findings. IMPRESSION: 1. No evidence of a pulmonary embolism. 2. Pleural effusions, interstitial thickening and bilateral areas of ground-glass lung opacity. This is most likely due to pulmonary edema. Lung infection or inflammation is possible. Electronically Signed   By: Lajean Manes M.D.   On: 07/31/2016 12:44   Ct Angio Chest Pe W Or Wo  Contrast  Result Date: 07/09/2016 CLINICAL DATA:  Acute onset of shortness of breath. Nausea and vomiting. Generalized chest pain. Initial encounter. EXAM: CT ANGIOGRAPHY CHEST WITH CONTRAST TECHNIQUE: Multidetector CT imaging of the chest was performed using the standard protocol during bolus administration of intravenous contrast. Multiplanar CT image reconstructions and MIPs were obtained to evaluate the vascular anatomy. CONTRAST:  100 mL of Isovue 370 IV contrast COMPARISON:  Chest radiograph performed earlier today at 12:43 a.m., and CTA of the chest performed 06/07/2016 FINDINGS: There is no evidence of pulmonary embolus. Trace bilateral pleural effusions are noted. Interstitial prominence is noted, concerning for mild interstitial edema. No pneumothorax is seen. There is no evidence of pneumothorax. Mild soft-tissue density is noted tracking about the right mainstem bronchus and azygoesophageal recess. This is similar in appearance to the prior study, and may reflect the patient's known metastatic disease. No axillary lymphadenopathy is seen. The visualized portions of the thyroid gland are unremarkable in appearance. Skin thickening is noted along the left breast, with mild associated soft tissue inflammation, concerning for inflammatory breast cancer. The visualized portions of the liver and spleen are unremarkable. Scattered sclerotic and lytic foci within the visualized osseous structures raise concern for metastatic disease. Review of the MIP images confirms the above findings. IMPRESSION: 1. No evidence of pulmonary embolus. 2. Trace bilateral pleural effusions noted. Interstitial prominence raises concern for mild interstitial edema. 3. Mild soft tissue density tracking about the right mainstem bronchus and azygoesophageal recess is similar in appearance, and may reflect the patient's known metastatic disease. 4. Skin thickening along the left breast, with mild associated soft tissue inflammation,  raising concern for inflammatory breast cancer, as previously noted. 5. Scattered sclerotic and lytic foci within the visualized osseous structures raise concern for metastatic disease. Electronically Signed   By: Garald Balding M.D.   On: 07/09/2016 06:15   Dg Chest Port 1 View  Result Date: 07/31/2016 CLINICAL DATA:  Cough since June. History of metastatic breast cancer to lungs, brain, and bone. Generalized pain for 3 days. History of diabetes. EXAM: PORTABLE CHEST 1 VIEW COMPARISON:  CT chest 07/09/2016.  Chest 07/09/2016. FINDINGS: Shallow inspiration. Normal heart size and pulmonary vascularity. Patchy infiltrates demonstrated throughout both lungs demonstrating progression since previous study. This could represent edema or multifocal pneumonia. Interstitial metastatic spread not excluded. No blunting of costophrenic angles. No pneumothorax. Surgical clips in the left axilla. IMPRESSION: Progressing interstitial infiltrates throughout both lungs since previous study. No focal consolidation. Electronically Signed   By: Lucienne Capers M.D.   On: 07/31/2016 05:36     Hridaan Bouse M.D on 08/01/2016 at 10:59 AM  Between 7am to 7pm - Pager - [phone]  After 7pm go to [email or web address] - password [facility]  [facility] -  Office  [phone]

## 2016-08-01 NOTE — Consult Note (Signed)
Consultation Note Date: 08/01/2016   Patient Name: Laura Matthews  DOB: 12-25-1965  MRN: [id number]  Age / Sex: 50 y.o., female  PCP: [name], MD Referring Physician: Albertine Patricia, MD  Reason for Consultation: Establishing goals of care and Pain control  HPI/Patient Profile: 50 y.o. female  with past medical history of metastatic breast cancer (mets to lungs, brain, and boes)  admitted on 07/31/2016 with hypoxia and uncontrolled pain. Palliative care consulted for pain management.   Clinical Assessment and Goals of Care: Patient complains of generalized pain, especially in her "creases" (joints). She also has heaviness and intense pain in her left breast. The pain is continuous. She notes it was controlled somewhat with Oxycontin and oxy IR, however, this was recently switched to methadone. Dilaudid for breakthrough has been added and is helping somewhat, but not as much as she would like. Of note- the attending hospitalist, Dr. Waldron Labs, informed me that there has been some concern regarding her use of opiates in the outpatient setting and this is what led to her being changed to methadone. He would like [place] to offer adjuvant medications, but not adjust her opiods or methadone.  We discussed her anxiety and depression and how these things can contribute to pain.  She is currently living at home with the assistance of her nephew, Erlene Quan. She feels very alone and depressed about her cancer diagnosis and being in pain. We discussed an antidepressant, she is currently on Mirtazapine, but she does not want to change this.  We discussed her cancer diagnosis and staging and the disease trajectory of Stage IV cancer, particularly when there is brain and lung involvement. She knows that her prognosis is not great and her life is limited, she wants to be as comfortable as possible, without suffering.   The  patient is now her primary decision maker for her care, however, followup meeting is scheduled to further discuss Advance Directives and healthcare surrogate.     SUMMARY OF RECOMMENDATIONS   -Lidocaine patch to left breast -Continue methadone '10mg'$  TID -Continue hydromorphone '1mg'$  -Lorazepam .'5mg'$  q4hr prn anxiety  -'500mg'$  Naproxen PO BID -'20mg'$  Pantoprazole as prophylactic for NSAID - I also think patient would benefit from community Palliative services for symptom management and continued [facility] discussion and eventual transfer to Hospice once aggressive therapy is stopped   Code Status/Advance Care Planning:  Full code - plan to address code status when patient is more emotionally stable    Symptom Management:   See recommendations as above  Palliative Prophylaxis:   Frequent Pain Assessment  Additional Recommendations (Limitations, Scope, Preferences):    Psycho-social/Spiritual:   Desire for further Chaplaincy support: Yes- I will contact [facility]  Additional Recommendations: Education on Hospice  Prognosis:   < 3 months d/t advancing cancer  Discharge Planning: To Be Determined      Primary Diagnoses: Present on Admission: . Respiratory failure with hypoxia ([place]) . Opiate dependence ([place]) . Breast cancer of upper-outer quadrant of left female breast ([place]) . Protein-calorie  malnutrition, moderate ([place]) . Cough . Hypoxia . Lung metastases ([place]) . Shortness of breath . Brain metastasis ([place])   I have reviewed the medical record, interviewed the patient and family, and examined the patient. The following aspects are pertinent.  Past Medical History:  Diagnosis Date  . Anemia    low iron  . Arthritis   . Depression    pt denies   . Family history of malignant neoplasm of breast   . Head ache   . Hot flashes   . Lymphedema of breast    left  . Malignant neoplasm of breast (female), unspecified site    left breast  . Mental disorder     depression-  . Newly diagnosed diabetes 11/12/2014   chemo increasing blood glucose; not on meds now  . Radiation 04/22/15-06/23/15   Left Breast   Social History   Social History  . Marital status: Single    Spouse name: N/A  . Number of children: N/A  . Years of education: N/A   Social History Main Topics  . Smoking status: Former Smoker    Packs/day: 0.25    Years: 20.00    Types: Cigarettes  . Smokeless tobacco: Never Used  . Alcohol use No     Comment: Percoset,Vicodin, Oxycotin  . Drug use:     Types: Oxycodone, Other-see comments     Comment: hx opiate abuse  . Sexual activity: Not Currently    Birth control/ protection: Abstinence   Other Topics Concern  . None   Social History Narrative   Lives with her daughter, [name].   Family History  Problem Relation Age of Onset  . Breast cancer Mother 78    currently 72; TAH/BSO d/t ?cervical ca at 2  . Lung cancer Father     deceased 16  . Breast cancer Maternal Aunt     dx 85s; currently in her late 38s  . Diabetes Neg Hx    Scheduled Meds: . dexamethasone  4 mg Oral Q8H  . docusate sodium  200 mg Oral BID  . enoxaparin (LOVENOX) injection  40 mg Subcutaneous Q24H  . feeding supplement (GLUCERNA SHAKE)  237 mL Oral TID BM  . furosemide  40 mg Intravenous BID  . glipiZIDE  10 mg Oral Q breakfast  . insulin aspart  0-20 Units Subcutaneous TID WC  . insulin aspart  0-5 Units Subcutaneous QHS  . insulin aspart  4 Units Subcutaneous TID WC  . ipratropium-albuterol  3 mL Nebulization Q6H WA  . letrozole  2.5 mg Oral Daily  . lidocaine  1 patch Transdermal Q24H  . methadone  10 mg Oral Q8H  . mirtazapine  30 mg Oral QHS  . naproxen  500 mg Oral BID WC  . palbociclib  125 mg Oral Q breakfast  . pantoprazole  20 mg Oral Daily  . sodium chloride flush  3 mL Intravenous Q12H   Continuous Infusions:  PRN Meds:.sodium chloride, acetaminophen **OR** acetaminophen, benzonatate, diphenhydrAMINE, HYDROmorphone,  ipratropium-albuterol, LORazepam, naLOXone (NARCAN)  injection, prochlorperazine, sodium chloride flush Medications Prior to Admission:  Prior to Admission medications   Medication Sig Start Date End Date Taking? Authorizing Provider  albuterol (PROVENTIL HFA;VENTOLIN HFA) 108 (90 Base) MCG/ACT inhaler Inhale 2 puffs into the lungs every 6 (six) hours as needed for wheezing or shortness of breath. 07/25/16  Yes Chauncey Cruel, MD  dexamethasone (DECADRON) 4 MG tablet Take 1 tablet (4 mg total) by mouth every 8 (eight) hours. 07/15/16  Yes Chauncey Cruel, MD  feeding supplement, GLUCERNA SHAKE, (GLUCERNA SHAKE) LIQD Take 237 mLs by mouth 3 (three) times daily between meals. 07/15/16  Yes Chauncey Cruel, MD  glipiZIDE (GLUCOTROL XL) 5 MG 24 hr tablet Take 2 tablets (10 mg total) by mouth daily with breakfast. 07/25/16  Yes Chauncey Cruel, MD  letrozole [facility]) 2.5 MG tablet Take 1 tablet (2.5 mg total) by mouth daily. 07/25/16  Yes Chauncey Cruel, MD  LORazepam (ATIVAN) 0.5 MG tablet Take 1 tablet (0.5 mg total) by mouth every 8 (eight) hours as needed for anxiety. 07/15/16  Yes Chauncey Cruel, MD  methadone (DOLOPHINE) 10 MG tablet Take 1 tablet (10 mg total) by mouth every 8 (eight) hours. 07/25/16  Yes Chauncey Cruel, MD  mirtazapine (REMERON) 30 MG tablet Take 1 tablet (30 mg total) by mouth at bedtime. 07/25/16  Yes Chauncey Cruel, MD  ondansetron (ZOFRAN ODT) 4 MG disintegrating tablet Take 1 tablet (4 mg total) by mouth every 8 (eight) hours as needed for nausea or vomiting. 07/25/16  Yes Chauncey Cruel, MD  palbociclib [facility]) 125 MG capsule Take 1 capsule (125 mg total) by mouth daily with breakfast. Take whole with food. 07/15/16  Yes Chauncey Cruel, MD  prochlorperazine (COMPAZINE) 10 MG tablet Take 1 tablet (10 mg total) by mouth every 6 (six) hours as needed for nausea or vomiting. 07/25/16  Yes Chauncey Cruel, MD   Allergies  Allergen Reactions  . Hydrocodone  Itching and Nausea Only   Review of Systems  Constitutional: Positive for activity change and fatigue.  Respiratory: Positive for cough and shortness of breath.   Cardiovascular: Negative.  Negative for chest pain.  Genitourinary: Negative.  Negative for difficulty urinating.  Neurological: Positive for weakness.  Psychiatric/Behavioral:       Depression   All other systems reviewed and are negative.   Physical Exam  Constitutional: She is oriented to person, place, and time. She appears well-developed.  thin  HENT:  Head: Normocephalic and atraumatic.  Eyes: Conjunctivae and EOM are normal. No scleral icterus.  Cardiovascular: Normal rate and regular rhythm.   Pulmonary/Chest: Effort normal.  Diminished throughout  Abdominal: Soft. Bowel sounds are normal. She exhibits no distension.  Musculoskeletal: Normal range of motion.  Neurological: She is alert and oriented to person, place, and time.  Skin: Skin is warm and dry.  Psychiatric:  Emotionally labile, anxious     Vital Signs: BP 134/78 (BP Location: Right Arm)   Pulse (!) 113   Temp 98.5 F (36.9 C) (Oral)   Resp 14   Ht '5\' 10"'$  (1.778 m)   Wt 76.5 kg (168 lb 9.6 oz)   SpO2 97%   BMI 24.19 kg/m  Pain Assessment: 0-10   Pain Score: 5    SpO2: SpO2: 97 % O2 Device:SpO2: 97 % O2 Flow Rate: .O2 Flow Rate (L/min): 1 L/min  IO: Intake/output summary:  Intake/Output Summary (Last 24 hours) at 08/01/16 1530 Last data filed at 08/01/16 1428  Gross per 24 hour  Intake             1480 ml  Output                0 ml  Net             1480 ml    LBM: Last BM Date: 07/31/16 Baseline Weight: Weight: 69.4 kg (153 lb) Most recent weight: Weight: 76.5 kg (168 lb 9.6  oz)     Palliative Assessment/Data: PPS: 60%     Time In:1400 Time Out: 1600 Time Total:120 mins Greater than 50%  of this time was spent counseling and coordinating care related to the above assessment and plan.  Signed by: Mariana Kaufman,  AGNP-C Palliative Medicine   Please contact Palliative Medicine Team phone at [phone] for questions and concerns.  For individual provider: See Shea Evans

## 2016-08-01 NOTE — Progress Notes (Signed)
Nurse in room to check patients oxygen level and titrate O2 down as needed.  Patient stated she is tired of being in the hospital and wants to go home.  States "How do I get out of here"  RN told patient that it is unlikely MD will discharge patient today as she is receiving IV Lasix to see if this will assist with her lungs and that if she really wanted to leave she would likely have to sign a paper leaving against medical advice.  Patient became very upset at nurse saying this, said it was unprofessional for the nurse to tell her she could sign a paper to leave the hospital.  RN apologized to patient and told patient she was simply answering the question that was asked.

## 2016-08-01 NOTE — Progress Notes (Signed)
RN went into patients room to inform her that Dr. Waldron Labs had made a consult to palliative care and ordered her 1 mg of po Dilaudid for pain.  Dilaudid pain medication administered.

## 2016-08-01 NOTE — Progress Notes (Signed)
Patient very upset that her pain medicine has been discontinued.  Patient wanted nursing staff to contact Dr. [name], I assured patient that Dr. Waldron Labs intends to contact Dr. [name] and discuss her pain regimen and that I would let her know what is ordered.

## 2016-08-01 NOTE — Progress Notes (Signed)
Patients nephew in to visit, nurse went into room to explain what was going on with patient.  Patient continues to be very angry, saying that RN told her this morning she had to sign a paper to leave and started packing her belongings up for RN to "put me out".  Again I reiterated to patient that she had asked me how she could leave the hospital and I told her the doctor would not discharge her, she would have to sign an AMA paper.  Patient increasingly upset, loud and verbally aggressive to nurse and nephew.  Nephew came out into hall and spoke with this RN who spoke with the charge nurse and had patient reassigned to another nurse.  Also notified Dr. Waldron Labs that patient was threatening to leave the hospital and he came up and spoke with patient and her nephew.

## 2016-08-02 ENCOUNTER — Encounter: Payer: Self-pay | Admitting: Pharmacist

## 2016-08-02 DIAGNOSIS — G893 Neoplasm related pain (acute) (chronic): Secondary | ICD-10-CM

## 2016-08-02 DIAGNOSIS — C779 Secondary and unspecified malignant neoplasm of lymph node, unspecified: Secondary | ICD-10-CM

## 2016-08-02 DIAGNOSIS — C7951 Secondary malignant neoplasm of bone: Secondary | ICD-10-CM

## 2016-08-02 DIAGNOSIS — R52 Pain, unspecified: Secondary | ICD-10-CM

## 2016-08-02 DIAGNOSIS — C7802 Secondary malignant neoplasm of left lung: Secondary | ICD-10-CM

## 2016-08-02 DIAGNOSIS — Z515 Encounter for palliative care: Secondary | ICD-10-CM

## 2016-08-02 DIAGNOSIS — C50412 Malignant neoplasm of upper-outer quadrant of left female breast: Secondary | ICD-10-CM

## 2016-08-02 DIAGNOSIS — R0602 Shortness of breath: Secondary | ICD-10-CM

## 2016-08-02 DIAGNOSIS — C7931 Secondary malignant neoplasm of brain: Secondary | ICD-10-CM

## 2016-08-02 DIAGNOSIS — Z7189 Other specified counseling: Secondary | ICD-10-CM

## 2016-08-02 LAB — BASIC METABOLIC PANEL
Anion gap: 10 (ref 5–15)
BUN: 24 mg/dL — AB (ref 6–20)
CHLORIDE: 96 mmol/L — AB (ref 101–111)
CO2: 29 mmol/L (ref 22–32)
Calcium: 8.7 mg/dL — ABNORMAL LOW (ref 8.9–10.3)
Creatinine, Ser: 0.48 mg/dL (ref 0.44–1.00)
GFR calc Af Amer: >60 mL/min (ref >60)
GFR calc non Af Amer: >60 mL/min (ref >60)
GLUCOSE: 223 mg/dL — AB (ref 65–99)
Potassium: 4.2 mmol/L (ref 3.5–5.1)
Sodium: 135 mmol/L (ref 135–145)

## 2016-08-02 LAB — CBC
HEMATOCRIT: 33 % — AB (ref 36.0–46.0)
HEMOGLOBIN: 10.9 g/dL — AB (ref 12.0–15.0)
MCH: 29.4 pg (ref 26.0–34.0)
MCHC: 33 g/dL (ref 30.0–36.0)
MCV: 88.9 fL (ref 78.0–100.0)
Platelets: 249 10*3/uL (ref 150–400)
RBC: 3.71 MIL/uL — ABNORMAL LOW (ref 3.87–5.11)
RDW: 19.3 % — ABNORMAL HIGH (ref 11.5–15.5)
WBC: 5.4 10*3/uL (ref 4.0–10.5)

## 2016-08-02 LAB — GLUCOSE, CAPILLARY: Glucose-Capillary: 263 mg/dL — ABNORMAL HIGH (ref 65–99)

## 2016-08-02 MED ORDER — LORAZEPAM 0.5 MG PO TABS: 0.5000 mg | ORAL_TABLET | ORAL | 0 refills | Status: DC | PRN

## 2016-08-02 MED ORDER — ACETAMINOPHEN 325 MG PO TABS: 650.0000 mg | ORAL_TABLET | Freq: Four times a day (QID) | ORAL | Status: DC | PRN

## 2016-08-02 MED ORDER — METHADONE HCL 5 MG PO TABS: 15.0000 mg | ORAL_TABLET | Freq: Three times a day (TID) | ORAL | 0 refills | Status: DC

## 2016-08-02 MED ORDER — BENZONATATE 100 MG PO CAPS: 100.0000 mg | ORAL_CAPSULE | Freq: Three times a day (TID) | ORAL | 0 refills | Status: AC | PRN

## 2016-08-02 MED ORDER — DEXAMETHASONE 4 MG PO TABS
4.0000 mg | ORAL_TABLET | Freq: Two times a day (BID) | ORAL | 0 refills | Status: DC
Start: 2016-08-02 — End: 2016-09-21

## 2016-08-02 MED ORDER — METHADONE HCL 5 MG PO TABS
15.0000 mg | ORAL_TABLET | Freq: Three times a day (TID) | ORAL | Status: DC
  Administered 2016-08-02: 15 mg via ORAL
  Filled 2016-08-02: qty 1

## 2016-08-02 MED ORDER — IPRATROPIUM-ALBUTEROL 0.5-2.5 (3) MG/3ML IN SOLN: 3.0000 mL | Freq: Three times a day (TID) | RESPIRATORY_TRACT | 0 refills | Status: DC

## 2016-08-02 MED ORDER — DIPHENHYDRAMINE HCL 25 MG PO CAPS: 25.0000 mg | ORAL_CAPSULE | Freq: Four times a day (QID) | ORAL | 0 refills | Status: DC | PRN

## 2016-08-02 MED ORDER — FUROSEMIDE 10 MG/ML IJ SOLN
60.0000 mg | Freq: Two times a day (BID) | INTRAMUSCULAR | Status: DC
  Administered 2016-08-02 (×2): 60 mg via INTRAVENOUS
  Filled 2016-08-02 (×2): qty 6

## 2016-08-02 MED ORDER — NAPROXEN 500 MG PO TABS: 500.0000 mg | ORAL_TABLET | Freq: Two times a day (BID) | ORAL | Status: DC

## 2016-08-02 MED ORDER — OXYCODONE HCL 5 MG PO TABS
10.0000 mg | ORAL_TABLET | ORAL | Status: DC | PRN
  Administered 2016-08-02 (×2): 10 mg via ORAL
  Filled 2016-08-02 (×2): qty 2

## 2016-08-02 MED ORDER — PANTOPRAZOLE SODIUM 20 MG PO TBEC: 40.0000 mg | DELAYED_RELEASE_TABLET | Freq: Every day | ORAL | 0 refills | Status: DC

## 2016-08-02 MED ORDER — OXYCODONE HCL 10 MG PO TABS: 10.0000 mg | ORAL_TABLET | ORAL | 0 refills | Status: DC | PRN

## 2016-08-02 MED ORDER — PANTOPRAZOLE SODIUM 20 MG PO TBEC: 20.0000 mg | DELAYED_RELEASE_TABLET | Freq: Every day | ORAL | 0 refills | Status: DC

## 2016-08-02 MED ORDER — INSULIN ASPART 100 UNIT/ML ~~LOC~~ SOLN: 7.0000 [IU] | Freq: Three times a day (TID) | SUBCUTANEOUS | Status: DC

## 2016-08-02 MED ORDER — INSULIN ASPART 100 UNIT/ML ~~LOC~~ SOLN
4.0000 [IU] | Freq: Three times a day (TID) | SUBCUTANEOUS | Status: DC
  Administered 2016-08-02 (×2): 4 [IU] via SUBCUTANEOUS

## 2016-08-02 MED ORDER — INSULIN GLARGINE 100 UNIT/ML ~~LOC~~ SOLN
8.0000 [IU] | Freq: Every day | SUBCUTANEOUS | Status: DC
  Administered 2016-08-02: 8 [IU] via SUBCUTANEOUS
  Filled 2016-08-02: qty 0.08

## 2016-08-02 MED ORDER — DOCUSATE SODIUM 100 MG PO CAPS: 100.0000 mg | ORAL_CAPSULE | Freq: Every day | ORAL | 0 refills | Status: AC

## 2016-08-02 NOTE — Progress Notes (Unsigned)
Oral Chemotherapy Pharmacist Encounter   [facility] that [facility] have dispensed patient's Ibrance on 07/26/16.  Pt was originally getting Ibrance filled at [facility] but appears Rx on [facility]/15/16 [facility] with 6 refills. This may end up being more convenient for the patient anyway. [facility] number for questions: [phone].  Johny Drilling, PharmD, BCPS 08/02/2016  12:08 PM Oral Chemotherapy Clinic [phone]

## 2016-08-02 NOTE — Discharge Instructions (Signed)
Follow with Primary MD [name], MD in 7 days /or hospice care physician   Activity: As tolerated with Full fall precautions use walker/cane & assistance as needed   Disposition Home with Hospice   Diet: Heart Healthy , carbohydrate modified , with feeding assistance and aspiration precautions.   On your next visit with your primary care physician please Get Medicines reviewed and adjusted.   Please request your Prim.MD to go over all Hospital Tests and Procedure/Radiological results at the follow up, please get all Hospital records sent to your Prim MD by signing hospital release before you go home.   If you experience worsening of your admission symptoms, develop shortness of breath, life threatening emergency, suicidal or homicidal thoughts you must seek medical attention immediately by calling 911 or calling your MD immediately  if symptoms less severe.  You Must read complete instructions/literature along with all the possible adverse reactions/side effects for all the Medicines you take and that have been prescribed to you. Take any new Medicines after you have completely understood and accpet all the possible adverse reactions/side effects.   Do not drive, operating heavy machinery, perform activities at heights, swimming or participation in water activities or provide baby sitting services if your were admitted for syncope or siezures until you have seen by Primary MD or a Neurologist and advised to do so again.  Do not drive when taking Pain medications.    Do not take more than prescribed Pain, Sleep and Anxiety Medications  Special Instructions: If you have smoked or chewed Tobacco  in the last 2 yrs please stop smoking, stop any regular Alcohol  and or any Recreational drug use.  Wear Seat belts while driving.   Please note  You were cared for by a hospitalist during your hospital stay. If you have any questions about your discharge medications or the care you  received while you were in the hospital after you are discharged, you can call the unit and asked to speak with the hospitalist on call if the hospitalist that took care of you is not available. Once you are discharged, your primary care physician will handle any further medical issues. Please note that NO REFILLS for any discharge medications will be authorized once you are discharged, as it is imperative that you return to your primary care physician (or establish a relationship with a primary care physician if you do not have one) for your aftercare needs so that they can reassess your need for medications and monitor your lab values.

## 2016-08-02 NOTE — Progress Notes (Signed)
Daily Progress Note   Patient Name: Laura Matthews       Date: 08/02/2016 DOB: 1966/10/07  Age: 50 y.o. MRN#: [id number] Attending Physician: Albertine Patricia, MD Primary Care Physician: [name], MD Admit Date: 07/31/2016  Reason for Consultation/Follow-up: Establishing goals of care, Pain control and Psychosocial/spiritual support  Subjective: Met with patient twice today, during her evaluation with Dr. [name] and also later in the afternoon to facilitate a family meeting with her and her two daughters- Laura Matthews and Laura Matthews. Discussed patient's poor prognosis- per Dr. [name] patient now with lymphanginic spread and necessary to transition to comfort care. Hospice referral in place. Answered daughter's and patient's questions about disease trajectory, symptom management, and the dying process. Patient and daughters note the goal is to keep patient at home, and avoid future hospitalizations. Reviewed how to use Hospice services in order to maintain this goal. Patient reports breast pain is better with lidocaine patch in place. Dr. [name] discussed starting Oxy IR 53m for breakthrough pain and she hopes this will control her pain better as it has in the past.   Discussed code status with patient and her daughters. They are understanding of and would like DNR status.   Review of Systems  Constitutional: Positive for malaise/fatigue and weight loss.  Respiratory: Positive for cough and shortness of breath.   Cardiovascular: Negative.   Neurological: Positive for weakness.  Psychiatric/Behavioral: The patient is nervous/anxious.   All other systems reviewed and are negative.   Length of Stay: 2  Current Medications: Scheduled Meds:  . dexamethasone  4 mg Oral Q8H  . docusate  sodium  200 mg Oral BID  . enoxaparin (LOVENOX) injection  40 mg Subcutaneous Q24H  . feeding supplement (GLUCERNA SHAKE)  237 mL Oral TID BM  . furosemide  60 mg Intravenous BID  . glipiZIDE  10 mg Oral Q breakfast  . insulin aspart  0-20 Units Subcutaneous TID WC  . insulin aspart  0-5 Units Subcutaneous QHS  . insulin aspart  4 Units Subcutaneous TID WC  . insulin glargine  8 Units Subcutaneous Daily  . ipratropium-albuterol  3 mL Nebulization TID  . letrozole  2.5 mg Oral Daily  . lidocaine  1 patch Transdermal Q24H  . methadone  10 mg Oral Q8H  . mirtazapine  30 mg Oral  QHS  . naproxen  500 mg Oral BID WC  . palbociclib  125 mg Oral Q breakfast  . pantoprazole  20 mg Oral Daily  . sodium chloride flush  3 mL Intravenous Q12H    Continuous Infusions:    PRN Meds: sodium chloride, acetaminophen **OR** acetaminophen, benzonatate, diphenhydrAMINE, HYDROmorphone, ipratropium-albuterol, LORazepam, naLOXone (NARCAN)  injection, prochlorperazine, sodium chloride flush  Physical Exam  Constitutional: She is oriented to person, place, and time.  Thin   HENT:  Head: Normocephalic and atraumatic.  Eyes: Conjunctivae are normal. No scleral icterus.  Cardiovascular: Normal rate and regular rhythm.   Pulmonary/Chest:  Diminished   Abdominal: Soft. Bowel sounds are normal.  Musculoskeletal: Normal range of motion.  Neurological: She is alert and oriented to person, place, and time.  Skin: Skin is warm and dry.  Psychiatric: She has a normal mood and affect. Her behavior is normal. Judgment and thought content normal.            Vital Signs: BP 129/83 (BP Location: Left Arm)   Pulse 64   Temp 98.6 F (37 C) (Oral)   Resp 15   Ht '5\' 10"'  (1.778 m)   Wt 69.8 kg (153 lb 14.4 oz)   SpO2 98%   BMI 22.08 kg/m  SpO2: SpO2: 98 % O2 Device: O2 Device: Not Delivered O2 Flow Rate: O2 Flow Rate (L/min): 4 L/min  Intake/output summary:  Intake/Output Summary (Last 24 hours) at  08/02/16 1010 Last data filed at 08/02/16 0600  Gross per 24 hour  Intake              960 ml  Output                0 ml  Net              960 ml   LBM: Last BM Date: 08/01/16 Baseline Weight: Weight: 69.4 kg (153 lb) Most recent weight: Weight: 69.8 kg (153 lb 14.4 oz)       Palliative Assessment/Data: PPS: 60%      Patient Active Problem List   Diagnosis Date Noted  . Goals of care, counseling/discussion   . Neoplasm related pain   . Palliative care by specialist   . Respiratory failure with hypoxia ([place]) 07/31/2016  . Acute on chronic respiratory failure with hypoxia ([place]) 07/31/2016  . Metastatic breast cancer ([place]) 07/31/2016  . Cancer, metastatic to bone ([place]) 06/10/2016  . Brain metastasis ([place])   . Protein-calorie malnutrition, severe 06/09/2016  . Pleural effusion   . S/P thoracentesis   . Acute respiratory failure with hypoxia ([place]) 06/07/2016  . Back pain 06/07/2016  . Protein-calorie malnutrition, moderate ([place]) 06/07/2016  . Cough   . Hypoxia   . Lung metastases ([place])   . Shortness of breath   . Bronchitis 05/25/2016  . Chest pain 02/02/2015  . Depression 01/26/2015  . Abscess of left breast 11/12/2014  . Diabetes mellitus without complication ([place]) 76/54/6503  . Neutropenia ([place]) 08/05/2014  . Lymphedema of breast 08/05/2014  . Breast cancer metastasized to axillary lymph node ([place]) 06/11/2014  . Family history of malignant neoplasm of breast   . Breast cancer of upper-outer quadrant of left female breast ([place]) 05/19/2014  . Opiate dependence ([place]) 05/01/2012    Class: Acute  . Homeless 05/01/2012    Class: Acute    Palliative Care Assessment & Plan   Patient Profile:  50 y.o. female  with past medical history of metastatic breast cancer (mets  to lungs, brain, and boes)  admitted on 07/31/2016 with hypoxia and uncontrolled pain. Palliative care consulted for pain management.  Assessment/Recommendations/Plan:  Metastatic breast cancer- no more  aggressive therapy, transition to comfort care, home with Hospice  Uncontrolled pain- increase methadone to 101m TID (per Dr. MJana Hakim; start Oxy IR 14mq 4 hr for breakthrough pain (stop Dilaudid); continue lidocaine patch, continue scheduled naproxen with protonix and miralax for GI prophylaxis  Anxiety- continue lorazepam .[id number]  Goals of Care and Additional Recommendations:  Limitations on Scope of Treatment: Avoid Hospitalization, Full Comfort Care, Minimize Medications, No Artificial Feeding, No Blood Transfusions, No Chemotherapy, No Diagnostics, No Hemodialysis, No IV Antibiotics, No IV Fluids, No Lab Draws, No Radiation, No Surgical Procedures and No Tracheostomy  Code Status:    Code Status Orders        Start     Ordered   07/31/16 0859  Full code  Continuous     07/31/16 0859    Code Status History    Date Active Date Inactive Code Status Order ID Comments User Context   06/07/2016  3:53 AM 06/10/2016  6:55 AM Full Code [id number] [id number] ED   11/12/2014  4:24 PM 11/16/2014  3:43 PM Full Code [id number] BostonD ED   06/11/2014  5:31 PM 06/12/2014 10:32 AM Full Code [id number] KleinD Inpatient    Advance Directive Documentation   Flowsheet Row Most Recent Value  Type of Advance Directive  Healthcare Power of Attorney  Pre-existing out of facility DNR order (yellow form or pink MOST form)  No data  "MOST" Form in Place?  No data       Prognosis:   < 6 weeks d/t progressing metastatic breast cancer with new lymphanginic worsening, plan for comfort care  Discharge Planning:  Home with Hospice  Care plan was discussed with patient, 2 daughters, Dr. MagJana HakimThank you for allowing the Palliative Medicine Team to assist in the care of this patient.   Time In: 1200 Time Out: 1330 Total Time 90 mins Prolonged Time Billed N      Greater than 50%  of this time was spent counseling and coordinating care related to the above assessment and  plan.  MahPayton EmeraldP  Please contact Palliative Medicine Team phone at [phone] questions and concerns.

## 2016-08-02 NOTE — Progress Notes (Signed)
Patient d/c home. Stable. 

## 2016-08-02 NOTE — Progress Notes (Signed)
Laura Matthews   DOB:1966-08-01   ZR#:[id number]   HLK#:[id number]  Subjective: met with Laura Matthews and her nephew this AM to discuss her situation. She is more accepting of the fact that her cancer is growing and will take her life. She is terrified of pain and dying alone. She tells me her pain is not well controlled. She is having good BMs. She is coughing a lot and is SOB with activity--no pleurisy or hemoptysis. Palliative nurse also in room   Objective: young African American woman examined in bed Vitals:   08/01/16 2114 08/02/16 0458  BP: 137/82 129/83  Matthews: 82 64  Resp: 16 15  Temp: 98.3 F (36.8 C) 98.6 F (37 C)    Body mass index is 22.08 kg/m.  Intake/Output Summary (Last 24 hours) at 08/02/16 7342 Last data filed at 08/02/16 0600  Gross per 24 hour  Intake              960 ml  Output                0 ml  Net              960 ml     Sclerae unicteric  No cervical or supraclavicular adenopathy  Lungs no wheezes--auscultated anterolaterally  Heart regular rate and rhythm  Abdomen soft, +BS  Neuro nonfocal  Breast exam:deferred  CBG (last 3)   Recent Labs  08/01/16 1156 08/01/16 1734 08/01/16 2139  GLUCAP 283* 268* 397*     Labs:  Lab Results  Component Value Date   WBC 5.4 08/02/2016   HGB 10.9 (L) 08/02/2016   HCT 33.0 (L) 08/02/2016   MCV 88.9 08/02/2016   PLT 249 08/02/2016   NEUTROABS 4.6 07/31/2016    '@LASTCHEMISTRY' @  Urine Studies No results for input(s): UHGB, CRYS in the last 72 hours.  Invalid input(s): UACOL, UAPR, USPG, UPH, UTP, UGL, UKET, UBIL, UNIT, UROB, ULEU, UEPI, UWBC, Pamala Duff[place], [place]  Basic Metabolic Panel:  Recent Labs Lab 07/31/16 0406 08/01/16 0413 08/02/16 0413  NA 137 137 135  K 4.3 4.9 4.2  CL 98* 99* 96*  CO2 '29 30 29  ' GLUCOSE 213* 273* 223*  BUN 13 15 24*  CREATININE 0.57 0.49 0.48  CALCIUM 8.9 8.5* 8.7*   GFR Estimated Creatinine Clearance: 93 mL/min (by C-G formula based on SCr of 0.48  mg/dL). Liver Function Tests: No results for input(s): AST, ALT, ALKPHOS, BILITOT, PROT, ALBUMIN in the last 168 hours. No results for input(s): LIPASE, AMYLASE in the last 168 hours. No results for input(s): AMMONIA in the last 168 hours. Coagulation profile No results for input(s): INR, PROTIME in the last 168 hours.  CBC:  Recent Labs Lab 07/31/16 0406 08/01/16 0413 08/02/16 0413  WBC 6.3 4.9 5.4  NEUTROABS 4.6  --   --   HGB 12.0 10.9* 10.9*  HCT 36.0 33.1* 33.0*  MCV 89.6 89.9 88.9  PLT 204 197 249   Cardiac Enzymes: No results for input(s): CKTOTAL, CKMB, CKMBINDEX, TROPONINI in the last 168 hours. BNP: Invalid input(s): POCBNP CBG:  Recent Labs Lab 07/31/16 2111 08/01/16 0739 08/01/16 1156 08/01/16 1734 08/01/16 2139  GLUCAP 368* 340* 283* 268* 397*   D-Dimer No results for input(s): DDIMER in the last 72 hours. Hgb A1c No results for input(s): HGBA1C in the last 72 hours. Lipid Profile No results for input(s): CHOL, HDL, LDLCALC, TRIG, CHOLHDL, LDLDIRECT in the last 72 hours. Thyroid function studies No results  for input(s): TSH, T4TOTAL, T3FREE, THYROIDAB in the last 72 hours.  Invalid input(s): FREET3 Anemia work up No results for input(s): VITAMINB12, FOLATE, FERRITIN, TIBC, IRON, RETICCTPCT in the last 72 hours. Microbiology No results found for this or any previous visit (from the past 240 hour(s)).    Studies:  Ct Angio Chest Pe W Or Wo Contrast  Result Date: 07/31/2016 CLINICAL DATA:  She reports that she has had progressively worsening and persistent cough. She is very short of breath. Her chest xray confirms worsening metastatic disease. She denies fever and chills. She reports cough nonproductive. Patient reports that she gets extremely short of breath when she stands and walks. Hx of breast ca dx 05/2014 lt sided lumpectomy with chemo in process and XRT completed EXAM: CT ANGIOGRAPHY CHEST WITH CONTRAST TECHNIQUE: Multidetector CT imaging of  the chest was performed using the standard protocol during bolus administration of intravenous contrast. Multiplanar CT image reconstructions and MIPs were obtained to evaluate the vascular anatomy. CONTRAST:  100 mL of Isovue 370 intravenous contrast COMPARISON:  Current chest radiograph.  CTA of the chest, 07/09/2016 FINDINGS: Cardiovascular: No evidence of a pulmonary embolism. Heart is normal in size and configuration. No significant coronary artery calcifications. Great vessels are normal in caliber. No aortic dissection. No atherosclerosis. Mediastinum/Nodes: No mediastinal or hilar masses. No discrete enlarged lymph nodes. Increased soft tissue attenuation along both hila. Trace pericardial fluid. Lungs/Pleura: Small bilateral pleural effusions. Patchy ground-glass opacities noted in both lungs, predominantly in the upper lobes. There is interstitial thickening that predominates in the lower lungs. No lung mass or nodule. No pneumothorax. Upper Abdomen: Hepatomegaly with extensive fatty infiltration of the liver. No acute findings. Musculoskeletal: Mild depressions of the upper endplates of T4 T6 and T9. Multiple mixed sclerotic and lytic lesions throughout the spine. There is a small seroma with associated vascular clips in the left breast. There is left breast skin thickening. Findings consistent with treatment for the known breast carcinoma. Findings are stable. Review of the MIP images confirms the above findings. IMPRESSION: 1. No evidence of a pulmonary embolism. 2. Pleural effusions, interstitial thickening and bilateral areas of ground-glass lung opacity. This is most likely due to pulmonary edema. Lung infection or inflammation is possible. Electronically Signed   By: Lajean Manes M.D.   On: 07/31/2016 12:44    Assessment: 50 y.o. [place] woman status post left breast upper outer quadrant biopsy 05/09/2014 for a clinicalT1c N1, stage IIA invasive ductal carcinoma, grade 2, estrogen and  progesterone receptor positive, HER-2 nonamplified, with an MIB-1 of 80%  (1) patient met with genetics counselor 05/29/2014 but decided against genetics testing   (2) tobacco abuse. The patient has been strongly urged to discontinue smoking. She was prescribed a nicotine patch 09/02/2014  (3) status post left lumpectomy and axillary lymph node dissection 06/11/2014 for a pT1c pN3, stage IIIC invasive ductal carcinoma, grade 3, with repeat HER-2 negative  (4) adjuvant chemotherapy consisting of doxorubicin and cyclophosphamide in dose dense fashion x4, completed 09/09/2014, followed by paclitaxel weekly x12 completed 03/09/2015  (5) adjuvant radiation 04/22/15-06/23/15 Left breast/ 45 Gy at 1.8 Gy per fraction x 25 fractions.  Left supraclavicular fossa and axilla/ 45 Gy at 1.8 Gy per fraction x 25 fractions Left breast boost/ 16 Gy at 2 Gy per fraction x 8 fractions  (6) tamoxifen started 07/01/2015, discontinued 06/09/2016 with evidence of disease progression  METASTATIC DISEASE: involving brain, lungs and bone documented August 2017 (7) CT angio 06/06/2016 c/w lymphangitic carcinomatosis and  lytic bone lesions (a) right thoracentesis 06/08/2016-- cytology confirms adenocarcinoma, estrogen receptor positive (b) brain MRI 06/09/2016 shows multiple metastases                   (c) CA 27-29 is informative  (8) starting letrozole 06/09/2016; palbociclib added 06/24/2016  (9) radiation to CNS and spine in 10 fractions 06/13/2016 through 06/27/2016  (10) poorly controlled pain:        (a) initially treated with oxycontin BID with OxyIR PRN, discontinued 07/25/2016 with the patient unable to keep her pain management agreement (multiple episodes of loss medications, need for early refills, not calling with medication changes, etc.       (b) started on methadone 10 mg by mouth 3 times a day as of 07/25/2016.  (11) denosumab/Xgeva started 07/15/2016,  discontinued October 2017 with progression  (12) changed to palliative/ supportive care 08/02/2016; issues are  (a) pain control  (b) bowel prophylaxis  (c) breathing support  (d) home setting--DNR status  Plan: I discussed Evamae's situation at length with her and her nephew Erlene Quan. They understand her cancer has continued to grow and it is invading her lungs (lymphangitic carcinomatosis). In my experience this is not a reversible process and they are agreeable to discontinuing life-prolonging aggressive therapy--which could actually shorten her life--and switch to a supportive/ palliative mode.  Goals are to make sure she is comfortable (pain control, help with breathing) and to keep her as fit as possible as long as possible. We discussed advanced directives and she agrees to a DNR. She agrees to Hospice referral. She plans to move in with her mother.  Accordingly suggest: Increase methadone to 15 mg po TID and add oxycodone 10 mg po for breakthrough pain Continue Miralax daily for bowel prophylaxis Will need an albuterol MDI, home O2, and dexamethasone  Out of Facility DNR Referral to Hospice and [place]  I will follow as outpatient  Greatly appreciate your help to this patient and her family!   Chauncey Cruel, MD 08/02/2016  8:22 AM Medical Oncology and Hematology [facility] [address] [place], [place] [address] Tel. [phone]    Fax. [phone]

## 2016-08-02 NOTE — Progress Notes (Signed)
Nursing Discharge Summary  Patient ID: Laura Matthews MRN: [id number] DOB/AGE: 1966/04/16 50 y.o.  Admit date: 07/31/2016 Discharge date: 08/02/2016  Discharged Condition: good  Disposition: 01-Home or Self Care    Prescriptions Given: Multiple prescriptions given to patient and daughter [name] at bedside.  Follow up appointments and medications discussed with patient and daughter.  Both verbalized understanding without further questions.    Means of Discharge: Patient to be discharged home via private vehicle to go home with Hospice.    Signed: Buel Ream 08/02/2016, 4:59 PM

## 2016-08-02 NOTE — Progress Notes (Signed)
Notified by Marney Doctor, CMRN of family request for Hospice and [place] services at home after discharge. Chart and patient information currently under review to confirm hospice eligibility.  Spoke with Ms. Nicole Kindred and both daughters at bedside to initiate education related to hospice philosophy, services and team approach to care. Family verbalized understanding of the information provided. Per discussion plan is for discharge to home by personal vehicle with her daughter today.   Please send signed completed DNR form home with patient.  Patient will need prescriptions for discharge comfort medications.  DME needs discussed and family requested oxygen concentrator, nebulizer, portable tanks and shower chair.  [facility] equipment manager Jewel Ysidro Evert notified and will contact [facility] to arrange delivery to the home.  The home address has been verified - Patient will be going to daughter's home at discharge- that address is [place] [employer]  [facility] aware of the above.  Completed discharge summary will need to be faxed to [facility] at [phone] when final.  Please notify [facility] when patient is ready to leave unit at discharge-call [phone].  [facility] information and contact numbers have been given to [place]  during visit.  Above information shared with Marney Doctor, CMRN.  Please call with any questions.  Thank You,  Mickie Kay, [facility] Liaison  [phone]

## 2016-08-02 NOTE — Care Management Note (Signed)
Case Management Note  Patient Details  Name: ISLAM VILLESCAS MRN: [id number] Date of Birth: 03-13-1966  Subjective/Objective:     50 yo admitted with Respiratory failure. Hx of breast CA             Action/Plan: From home alone. Per pt, she plans to go home with her daughter and "let her place go." Pt offered choice for home hospice services per MD request. Pt chose [facility]. [facility] referral called. Pt will need home 02 and nebulizers.  CM will continue to follow.  Expected Discharge Date:                  Expected Discharge Plan:  Home w Hospice Care  In-House Referral:     Discharge planning Services  CM Consult  Post Acute Care Choice:  Hospice Choice offered to:  Patient  DME Arranged:    DME Agency:     HH Arranged:  Disease Management [place] Agency:  [facility]  Status of Service:  In process, will continue to follow  If discussed at Long Length of Stay Meetings, dates discussed:    Additional CommentsLynnell Catalan, RN 08/02/2016, 10:58 AM [phone]

## 2016-08-02 NOTE — Discharge Summary (Signed)
Laura Matthews, is a 50 y.o. female  DOB [birth year]  MRN [id number].  Admission date:  07/31/2016  Admitting Physician  Vianne Bulls, MD  Discharge Date:  08/02/2016   Primary MD  [name], MD  Recommendations for primary care physician for things to follow:  - Further management per hospice service    Admission Diagnosis  Acute on chronic respiratory failure with hypoxia [facility]) [J96.21] Metastatic breast cancer [facility]) [C50.919]   Discharge Diagnosis  Acute on chronic respiratory failure with hypoxia ([place]) [J96.21] Metastatic breast cancer ([place]) [C50.919]   Active Problems:   Opiate dependence ([place])   Breast cancer of upper-outer quadrant of left female breast ([place])   Diabetes mellitus without complication ([place])   Protein-calorie malnutrition, moderate ([place])   Cough   Hypoxia   Lung metastases ([place])   Shortness of breath   Brain metastasis ([facility])   Respiratory failure with hypoxia ([place])   Acute on chronic respiratory failure with hypoxia ([place])   Metastatic breast cancer ([place])   Goals of care, counseling/discussion   Neoplasm related pain   Palliative care by specialist      Past Medical History:  Diagnosis Date  . Anemia    low iron  . Arthritis   . Depression    pt denies   . Family history of malignant neoplasm of breast   . Head ache   . Hot flashes   . Lymphedema of breast    left  . Malignant neoplasm of breast (female), unspecified site    left breast  . Mental disorder    depression-  . Newly diagnosed diabetes 11/12/2014   chemo increasing blood glucose; not on meds now  . Radiation 04/22/15-06/23/15   Left Breast    Past Surgical History:  Procedure Laterality Date  . BREAST LUMPECTOMY WITH SENTINEL LYMPH NODE BIOPSY Left 06/11/2014   Procedure: BREAST LUMPECTOMY WITH SENTINEL LYMPH NODE BX, POSSIBLE AXILLARY LYMPH NODE DISSECTION;  Surgeon: Stark Klein,  MD;  Location: [place];  Service: General;  Laterality: Left;  . EVACUATION BREAST HEMATOMA Left 07/10/2014   Procedure: EVACUATION OF LEFT BREAST HEMATOMA ;  Surgeon: Stark Klein, MD;  Location: [place];  Service: General;  Laterality: Left;  . FOOT SURGERY  2011   rt  . INCISION AND DRAINAGE Left 07/02/2014   Procedure: Drainage left breast seroma;  Surgeon: Stark Klein, MD;  Location: [place];  Service: General;  Laterality: Left;  . INCISION AND DRAINAGE ABSCESS N/A 11/12/2014   Procedure: ASPIRATION AND INCISION AND DRAINAGE LEFT BREAST ABSCESS;  Surgeon: Michael Boston, MD;  Location: [facility];  Service: General;  Laterality: N/A;  . [place]   right  . PORT-A-CATH REMOVAL Left 07/22/2015   Procedure: REMOVAL PORT-A-CATH;  Surgeon: Stark Klein, MD;  Location: [place];  Service: General;  Laterality: Left;  . PORTACATH PLACEMENT N/A 07/02/2014   Procedure: INSERTION PORT-A-CATH;  Surgeon: Stark Klein, MD;  Location: [place];  Service:  General;  Laterality: N/A;  . TUBAL LIGATION    . UTERINE FIBROID SURGERY  2005   ablation       History of present illness and  Hospital Course:     Kindly see H&P for history of present illness and admission details, please review complete Labs, Consult reports and Test reports for all details in brief  HPI  from the history and physical done on the day of admission 07/31/2016  HPI: Laura Matthews is a 50 y.o. female with a history of metastatic breast cancer which has spread to her lungs, brain and bone who presents to the Emergency Department complaining of persistent generalized pain onset 3 days ago. She has been opioid dependent but there has been difficulty managing her pain outpatient because as noted by Dr. [name] in last note patient had not been following the pain management agreement appropriately with multiple excuses of losing her pills, pill counts not correct  and chaotic home situation.  She was started on methadone 10 mg TID on 07/27/16 by Dr. [name].  She reports today that nothing helps her pain. She has been taking her pain medication without improvement.  She reports that she has had progressively worsening and persistent cough. She is very short of breath.  Her chest xray confirms worsening metastatic disease.  She denies fever and chills.  She reports cough nonproductive.  Patient reports that she gets extremely short of breath when she stands and walks. She is having trouble even getting to the bathroom because of her difficulty breathing.  She was noted to be severely hypoxic when ambulating and her pulse drops into the 70s.  She is being admitted for further management.     Hospital Course   Acute hypoxic respiratory failure - Patient hypoxic, requiring 4 L nasal cannula currently, CTA chest negative for PE, but significant bilateral pleural effusion, seen by oncology, this was felt secondary to tumor spread and lymphangitic carcinomatosis, goals of care was discussed with primary oncologist, at this point goal is for comfort care, pain control, and breathing support. Hospice service consulted, they'll provide nebulizers, and home oxygen.   chronic diastolic CHF , not in acute exacerbation - 2-D echo 05/2016 with EF 82%, grade 1 diastolic dysfunction - No need for Lasix on discharge  Metastatic breast cancer - Please see above discussion regarding comfort care - Continue with Decadron 5 mg oral twice a day  Opioid dependence / Chronic cancer pain uncontrolled  -Patient will be discharged with hospice care, home regimen has been adjusted, will be discharged on methadone 15 mg oral 3 times daily, oxycodone 10 mg every 4 hours as needed.  Type 2 diabetes mellitus - Uncontrolled during hospital stay, will resume on home glipizide on discharge  Discharge Condition:  Patient being discharged under hospice care, she is comfort  care   Follow UP   as determined per hospice   Discharge Instructions  and  Discharge Medications     Discharge Instructions    Discharge instructions    Complete by:  As directed    Follow with Primary MD [name], MD in 7 days /or hospice care physician   Activity: As tolerated with Full fall precautions use walker/cane & assistance as needed   Disposition Home with Hospice   Diet: Heart Healthy , carbohydrate modified , with feeding assistance and aspiration precautions.   On your next visit with your primary care physician please Get Medicines reviewed and adjusted.   Please request your  Prim.MD to go over all Hospital Tests and Procedure/Radiological results at the follow up, please get all Hospital records sent to your Prim MD by signing hospital release before you go home.   If you experience worsening of your admission symptoms, develop shortness of breath, life threatening emergency, suicidal or homicidal thoughts you must seek medical attention immediately by calling 911 or calling your MD immediately  if symptoms less severe.  You Must read complete instructions/literature along with all the possible adverse reactions/side effects for all the Medicines you take and that have been prescribed to you. Take any new Medicines after you have completely understood and accpet all the possible adverse reactions/side effects.   Do not drive, operating heavy machinery, perform activities at heights, swimming or participation in water activities or provide baby sitting services if your were admitted for syncope or siezures until you have seen by Primary MD or a Neurologist and advised to do so again.  Do not drive when taking Pain medications.    Do not take more than prescribed Pain, Sleep and Anxiety Medications  Special Instructions: If you have smoked or chewed Tobacco  in the last 2 yrs please stop smoking, stop any regular Alcohol  and or any Recreational  drug use.  Wear Seat belts while driving.   Please note  You were cared for by a hospitalist during your hospital stay. If you have any questions about your discharge medications or the care you received while you were in the hospital after you are discharged, you can call the unit and asked to speak with the hospitalist on call if the hospitalist that took care of you is not available. Once you are discharged, your primary care physician will handle any further medical issues. Please note that NO REFILLS for any discharge medications will be authorized once you are discharged, as it is imperative that you return to your primary care physician (or establish a relationship with a primary care physician if you do not have one) for your aftercare needs so that they can reassess your need for medications and monitor your lab values.   Increase activity slowly    Complete by:  As directed        Medication List    STOP taking these medications   letrozole 2.5 MG tablet Commonly known as:  FEMARA   palbociclib 125 MG capsule Commonly known as:  IBRANCE     TAKE these medications   acetaminophen 325 MG tablet Commonly known as:  TYLENOL Take 2 tablets (650 mg total) by mouth every 6 (six) hours as needed for mild pain (or Fever >/= 101).   albuterol 108 (90 Base) MCG/ACT inhaler Commonly known as:  PROVENTIL HFA;VENTOLIN HFA Inhale 2 puffs into the lungs every 6 (six) hours as needed for wheezing or shortness of breath.   benzonatate 100 MG capsule Commonly known as:  TESSALON Take 1 capsule (100 mg total) by mouth 3 (three) times daily as needed for cough.   dexamethasone 4 MG tablet Commonly known as:  DECADRON Take 1 tablet (4 mg total) by mouth 2 (two) times daily. What changed:  when to take this   diphenhydrAMINE 25 mg capsule Commonly known as:  BENADRYL Take 1 capsule (25 mg total) by mouth every 6 (six) hours as needed for itching (Hold for sedation).   docusate sodium  100 MG capsule Commonly known as:  COLACE Take 1 capsule (100 mg total) by mouth daily.   feeding supplement ([place])  Liqd Take 237 mLs by mouth 3 (three) times daily between meals.   glipiZIDE 5 MG 24 hr tablet Commonly known as:  GLUCOTROL XL Take 2 tablets (10 mg total) by mouth daily with breakfast.   ipratropium-albuterol 0.5-2.5 (3) MG/3ML Soln Commonly known as:  DUONEB Take 3 mLs by nebulization 3 (three) times daily.   LORazepam 0.5 MG tablet Commonly known as:  ATIVAN Take 1 tablet (0.5 mg total) by mouth every 4 (four) hours as needed for anxiety. What changed:  when to take this   methadone 5 MG tablet Commonly known as:  DOLOPHINE Take 3 tablets (15 mg total) by mouth every 8 (eight) hours. What changed:  medication strength  how much to take   mirtazapine 30 MG tablet Commonly known as:  REMERON Take 1 tablet (30 mg total) by mouth at bedtime.   naproxen 500 MG tablet Commonly known as:  NAPROSYN Take 1 tablet (500 mg total) by mouth 2 (two) times daily with a meal.   ondansetron 4 MG disintegrating tablet Commonly known as:  ZOFRAN ODT Take 1 tablet (4 mg total) by mouth every 8 (eight) hours as needed for nausea or vomiting.   Oxycodone HCl 10 MG Tabs Take 1 tablet (10 mg total) by mouth every 4 (four) hours as needed for breakthrough pain.   pantoprazole 20 MG tablet Commonly known as:  PROTONIX Take 2 tablets (40 mg total) by mouth daily. Start taking on:  08/03/2016   prochlorperazine 10 MG tablet Commonly known as:  COMPAZINE Take 1 tablet (10 mg total) by mouth every 6 (six) hours as needed for nausea or vomiting.         Diet and Activity recommendation: See Discharge Instructions above   Consults obtained -  Oncology Palliative medicine   Major procedures and Radiology Reports - PLEASE review detailed and final reports for all details, in brief -    Dg Chest 2 View  Result Date: 07/09/2016 CLINICAL DATA:  50 year old  female with shortness of breath. History of lung and breast cancers. EXAM: CHEST  2 VIEW COMPARISON:  Chest radiograph dated 06/09/2016 FINDINGS: Two views of the chest demonstrate stop mild diffuse interstitial prominence with more nodular appearance in the mid to lower lung field. There has been interval improved appearance of the lung markings compared to the prior study. There is no focal consolidation, or pneumothorax. Probable trace bilateral pleural effusions. The cardiac silhouette is within normal limits. Left axillary surgical clips noted. No acute osseous pathology. IMPRESSION: Mild interstitial prominence and nodularity primarily involving the mid to lower lung field. Although findings may be related to mild vascular congestion, given interval improved appearance compared to the prior study, lymphangitic spread of tumor is not entirely excluded. Clinical correlation and follow-up recommended. No focal consolidation. Electronically Signed   By: Anner Crete M.D.   On: 07/09/2016 01:48   Ct Angio Chest Pe W Or Wo Contrast  Result Date: 07/31/2016 CLINICAL DATA:  She reports that she has had progressively worsening and persistent cough. She is very short of breath. Her chest xray confirms worsening metastatic disease. She denies fever and chills. She reports cough nonproductive. Patient reports that she gets extremely short of breath when she stands and walks. Hx of breast ca dx 05/2014 lt sided lumpectomy with chemo in process and XRT completed EXAM: CT ANGIOGRAPHY CHEST WITH CONTRAST TECHNIQUE: Multidetector CT imaging of the chest was performed using the standard protocol during bolus administration of intravenous contrast. Multiplanar CT image  reconstructions and MIPs were obtained to evaluate the vascular anatomy. CONTRAST:  100 mL of Isovue 370 intravenous contrast COMPARISON:  Current chest radiograph.  CTA of the chest, 07/09/2016 FINDINGS: Cardiovascular: No evidence of a pulmonary embolism.  Heart is normal in size and configuration. No significant coronary artery calcifications. Great vessels are normal in caliber. No aortic dissection. No atherosclerosis. Mediastinum/Nodes: No mediastinal or hilar masses. No discrete enlarged lymph nodes. Increased soft tissue attenuation along both hila. Trace pericardial fluid. Lungs/Pleura: Small bilateral pleural effusions. Patchy ground-glass opacities noted in both lungs, predominantly in the upper lobes. There is interstitial thickening that predominates in the lower lungs. No lung mass or nodule. No pneumothorax. Upper Abdomen: Hepatomegaly with extensive fatty infiltration of the liver. No acute findings. Musculoskeletal: Mild depressions of the upper endplates of T4 T6 and T9. Multiple mixed sclerotic and lytic lesions throughout the spine. There is a small seroma with associated vascular clips in the left breast. There is left breast skin thickening. Findings consistent with treatment for the known breast carcinoma. Findings are stable. Review of the MIP images confirms the above findings. IMPRESSION: 1. No evidence of a pulmonary embolism. 2. Pleural effusions, interstitial thickening and bilateral areas of ground-glass lung opacity. This is most likely due to pulmonary edema. Lung infection or inflammation is possible. Electronically Signed   By: Lajean Manes M.D.   On: 07/31/2016 12:44   Ct Angio Chest Pe W Or Wo Contrast  Result Date: 07/09/2016 CLINICAL DATA:  Acute onset of shortness of breath. Nausea and vomiting. Generalized chest pain. Initial encounter. EXAM: CT ANGIOGRAPHY CHEST WITH CONTRAST TECHNIQUE: Multidetector CT imaging of the chest was performed using the standard protocol during bolus administration of intravenous contrast. Multiplanar CT image reconstructions and MIPs were obtained to evaluate the vascular anatomy. CONTRAST:  100 mL of Isovue 370 IV contrast COMPARISON:  Chest radiograph performed earlier today at 12:43 a.m., and  CTA of the chest performed 06/07/2016 FINDINGS: There is no evidence of pulmonary embolus. Trace bilateral pleural effusions are noted. Interstitial prominence is noted, concerning for mild interstitial edema. No pneumothorax is seen. There is no evidence of pneumothorax. Mild soft-tissue density is noted tracking about the right mainstem bronchus and azygoesophageal recess. This is similar in appearance to the prior study, and may reflect the patient's known metastatic disease. No axillary lymphadenopathy is seen. The visualized portions of the thyroid gland are unremarkable in appearance. Skin thickening is noted along the left breast, with mild associated soft tissue inflammation, concerning for inflammatory breast cancer. The visualized portions of the liver and spleen are unremarkable. Scattered sclerotic and lytic foci within the visualized osseous structures raise concern for metastatic disease. Review of the MIP images confirms the above findings. IMPRESSION: 1. No evidence of pulmonary embolus. 2. Trace bilateral pleural effusions noted. Interstitial prominence raises concern for mild interstitial edema. 3. Mild soft tissue density tracking about the right mainstem bronchus and azygoesophageal recess is similar in appearance, and may reflect the patient's known metastatic disease. 4. Skin thickening along the left breast, with mild associated soft tissue inflammation, raising concern for inflammatory breast cancer, as previously noted. 5. Scattered sclerotic and lytic foci within the visualized osseous structures raise concern for metastatic disease. Electronically Signed   By: Garald Balding M.D.   On: 07/09/2016 06:15   Dg Chest Port 1 View  Result Date: 07/31/2016 CLINICAL DATA:  Cough since June. History of metastatic breast cancer to lungs, brain, and bone. Generalized pain for 3 days. History  of diabetes. EXAM: PORTABLE CHEST 1 VIEW COMPARISON:  CT chest 07/09/2016.  Chest 07/09/2016. FINDINGS:  Shallow inspiration. Normal heart size and pulmonary vascularity. Patchy infiltrates demonstrated throughout both lungs demonstrating progression since previous study. This could represent edema or multifocal pneumonia. Interstitial metastatic spread not excluded. No blunting of costophrenic angles. No pneumothorax. Surgical clips in the left axilla. IMPRESSION: Progressing interstitial infiltrates throughout both lungs since previous study. No focal consolidation. Electronically Signed   By: Lucienne Capers M.D.   On: 07/31/2016 05:36    Micro Results     No results found for this or any previous visit (from the past 240 hour(s)).     Today   Subjective:   Laura Matthews today has no headache,no chest pain,Denies any cough, reports dyspnea at baseline, complaints was buddy ache  Objective:   Blood pressure 129/83, pulse 64, temperature 98.6 F (37 C), temperature source Oral, resp. rate 15, height '5\' 10"'$  (1.778 m), weight 69.8 kg (153 lb 14.4 oz), SpO2 98 %.   Intake/Output Summary (Last 24 hours) at 08/02/16 1353 Last data filed at 08/02/16 0946  Gross per 24 hour  Intake              960 ml  Output                0 ml  Net              960 ml    Exam Awake Alert, Oriented X 3, Supple Neck,No JVD,  Symmetrical Chest wall movement, Good air movement bilaterally, no wheezing RRR,No Gallops,Rubs or new Murmurs, No Parasternal Heave +ve B.Sounds, Abd Soft, No tenderness, No rebound - guarding or rigidity. No Cyanosis, Clubbing or edema, No new Rash or bruise    Data Review   CBC w Diff:  Lab Results  Component Value Date   WBC 5.4 08/02/2016   HGB 10.9 (L) 08/02/2016   HGB 11.9 07/25/2016   HCT 33.0 (L) 08/02/2016   HCT 35.4 07/25/2016   PLT 249 08/02/2016   PLT 194 07/25/2016   LYMPHOPCT 24 07/31/2016   LYMPHOPCT 20.8 07/25/2016   BANDSPCT 5 07/31/2016   MONOPCT 3 07/31/2016   MONOPCT 7.1 07/25/2016   EOSPCT 0 07/31/2016   EOSPCT 0.0 07/25/2016   BASOPCT 0  07/31/2016   BASOPCT 1.3 07/25/2016    CMP:  Lab Results  Component Value Date   NA 135 08/02/2016   NA 137 07/25/2016   K 4.2 08/02/2016   K 4.1 07/25/2016   CL 96 (L) 08/02/2016   CO2 29 08/02/2016   CO2 23 07/25/2016   BUN 24 (H) 08/02/2016   BUN 13.1 07/25/2016   CREATININE 0.48 08/02/2016   CREATININE 0.7 07/25/2016   PROT 7.0 07/25/2016   ALBUMIN 3.2 (L) 07/25/2016   BILITOT <0.30 07/25/2016   ALKPHOS 123 07/25/2016   AST 19 07/25/2016   ALT 35 07/25/2016  .   Total Time in preparing paper work, data evaluation and todays exam - 35 minutes  Laura Matthews M.D on 08/02/2016 at 1:53 PM  [facility]   Office  [phone]

## 2016-08-03 LAB — GLUCOSE, CAPILLARY
GLUCOSE-CAPILLARY: 406 mg/dL — AB (ref 65–99)
GLUCOSE-CAPILLARY: 342 mg/dL — AB (ref 65–99)

## 2016-08-04 ENCOUNTER — Telehealth: Payer: Self-pay

## 2016-08-04 MED ORDER — IPRATROPIUM-ALBUTEROL 0.5-2.5 (3) MG/3ML IN SOLN: 3.0000 mL | Freq: Three times a day (TID) | RESPIRATORY_TRACT | 0 refills | Status: DC

## 2016-08-04 NOTE — Telephone Encounter (Signed)
Laura Matthews with hospice called. Pt has been dc from hospital to hospice. Laura Matthews is reconciling medications. Duoneb is on pt list but not in the house. Pt states she never saw a rx for this. New Rx sent to [facility]. Pt current MAR in epic faxed to hospice to help with reconciliation.   Pt states she wants to use [facility] b/c they deliver to the house. Laura Matthews will facilitate rx transfers from [facility] to [facility] for the pt.

## 2016-08-05 ENCOUNTER — Telehealth: Payer: Self-pay | Admitting: *Deleted

## 2016-08-05 ENCOUNTER — Other Ambulatory Visit: Payer: Self-pay | Admitting: *Deleted

## 2016-08-05 MED ORDER — METHADONE HCL 5 MG PO TABS: 15.0000 mg | ORAL_TABLET | Freq: Three times a day (TID) | ORAL | 0 refills | Status: DC

## 2016-08-05 MED ORDER — OXYCODONE HCL 10 MG PO TABS: 10.0000 mg | ORAL_TABLET | ORAL | 0 refills | Status: DC | PRN

## 2016-08-05 MED ORDER — DIPHENHYDRAMINE HCL 25 MG PO CAPS: 25.0000 mg | ORAL_CAPSULE | Freq: Four times a day (QID) | ORAL | 0 refills | Status: DC | PRN

## 2016-08-05 MED ORDER — LORAZEPAM 0.5 MG PO TABS: 0.5000 mg | ORAL_TABLET | ORAL | 0 refills | Status: DC | PRN

## 2016-08-05 MED ORDER — PROCHLORPERAZINE MALEATE 10 MG PO TABS: 10.0000 mg | ORAL_TABLET | Freq: Four times a day (QID) | ORAL | 0 refills | Status: DC | PRN

## 2016-08-05 NOTE — Telephone Encounter (Signed)
This RN attempted to contact pt for update post discharge from hospital.  Pt's number states " pt not available to take call / mail box is full "  Attempted to contact pt's mother with number stating " not a working number ".  This RN contacted [facility] and inquired per above and requested a call from [facility] attending pt for communication.  This RN's name and direct number given for RN to RN contact.

## 2016-08-09 ENCOUNTER — Inpatient Hospital Stay: Admission: RE | Admit: 2016-08-09 | Payer: Self-pay | Source: Ambulatory Visit

## 2016-08-11 DIAGNOSIS — Z7189 Other specified counseling: Secondary | ICD-10-CM

## 2016-08-12 ENCOUNTER — Telehealth: Payer: Self-pay | Admitting: *Deleted

## 2016-08-12 ENCOUNTER — Other Ambulatory Visit (HOSPITAL_BASED_OUTPATIENT_CLINIC_OR_DEPARTMENT_OTHER): Payer: Medicaid Other

## 2016-08-12 ENCOUNTER — Ambulatory Visit (HOSPITAL_BASED_OUTPATIENT_CLINIC_OR_DEPARTMENT_OTHER): Payer: Medicaid Other | Admitting: Oncology

## 2016-08-12 VITALS — BP 157/84 | HR 104 | Temp 98.1°F | Resp 18 | Ht 70.0 in | Wt 155.4 lb

## 2016-08-12 DIAGNOSIS — C7931 Secondary malignant neoplasm of brain: Secondary | ICD-10-CM

## 2016-08-12 DIAGNOSIS — C7802 Secondary malignant neoplasm of left lung: Secondary | ICD-10-CM

## 2016-08-12 DIAGNOSIS — C7951 Secondary malignant neoplasm of bone: Secondary | ICD-10-CM | POA: Diagnosis not present

## 2016-08-12 DIAGNOSIS — C50412 Malignant neoplasm of upper-outer quadrant of left female breast: Secondary | ICD-10-CM

## 2016-08-12 DIAGNOSIS — C50919 Malignant neoplasm of unspecified site of unspecified female breast: Secondary | ICD-10-CM

## 2016-08-12 DIAGNOSIS — C50912 Malignant neoplasm of unspecified site of left female breast: Secondary | ICD-10-CM

## 2016-08-12 DIAGNOSIS — C773 Secondary and unspecified malignant neoplasm of axilla and upper limb lymph nodes: Principal | ICD-10-CM

## 2016-08-12 DIAGNOSIS — G893 Neoplasm related pain (acute) (chronic): Secondary | ICD-10-CM

## 2016-08-12 DIAGNOSIS — C7801 Secondary malignant neoplasm of right lung: Secondary | ICD-10-CM

## 2016-08-12 LAB — COMPREHENSIVE METABOLIC PANEL
ALT: 35 U/L (ref 0–55)
ANION GAP: 16 meq/L — AB (ref 3–11)
AST: 14 U/L (ref 5–34)
Albumin: 3.1 g/dL — ABNORMAL LOW (ref 3.5–5.0)
Alkaline Phosphatase: 104 U/L (ref 40–150)
BUN: 17.7 mg/dL (ref 7.0–26.0)
CHLORIDE: 98 meq/L (ref 98–109)
CO2: 20 meq/L — AB (ref 22–29)
Calcium: 9.2 mg/dL (ref 8.4–10.4)
Creatinine: 0.8 mg/dL (ref 0.6–1.1)
GLUCOSE: 409 mg/dL — AB (ref 70–140)
POTASSIUM: 3.5 meq/L (ref 3.5–5.1)
SODIUM: 134 meq/L — AB (ref 136–145)
TOTAL PROTEIN: 6.6 g/dL (ref 6.4–8.3)
Total Bilirubin: 0.25 mg/dL (ref 0.20–1.20)

## 2016-08-12 LAB — CBC WITH DIFFERENTIAL/PLATELET
BASO%: 0 % (ref 0.0–2.0)
Basophils Absolute: 0 10*3/uL (ref 0.0–0.1)
EOS ABS: 0 10*3/uL (ref 0.0–0.5)
EOS%: 0.2 % (ref 0.0–7.0)
HCT: 37.1 % (ref 34.8–46.6)
HGB: 12.6 g/dL (ref 11.6–15.9)
LYMPH%: 10.6 % — AB (ref 14.0–49.7)
MCH: 30.3 pg (ref 25.1–34.0)
MCHC: 34 g/dL (ref 31.5–36.0)
MCV: 89.2 fL (ref 79.5–101.0)
MONO#: 0.2 10*3/uL (ref 0.1–0.9)
MONO%: 3.1 % (ref 0.0–14.0)
NEUT%: 86.1 % — AB (ref 38.4–76.8)
NEUTROS ABS: 5.3 10*3/uL (ref 1.5–6.5)
PLATELETS: 155 10*3/uL (ref 145–400)
RBC: 4.16 10*6/uL (ref 3.70–5.45)
RDW: 19.1 % — ABNORMAL HIGH (ref 11.2–14.5)
WBC: 6.1 10*3/uL (ref 3.9–10.3)
lymph#: 0.7 10*3/uL — ABNORMAL LOW (ref 0.9–3.3)

## 2016-08-12 MED ORDER — MIRTAZAPINE 30 MG PO TABS: 30.0000 mg | ORAL_TABLET | Freq: Every day | ORAL | 3 refills | Status: AC

## 2016-08-12 MED ORDER — OXYCODONE HCL ER 40 MG PO T12A: 40.0000 mg | EXTENDED_RELEASE_TABLET | Freq: Two times a day (BID) | ORAL | 0 refills | Status: DC

## 2016-08-12 MED ORDER — METHADONE HCL 5 MG PO TABS: 15.0000 mg | ORAL_TABLET | Freq: Three times a day (TID) | ORAL | 0 refills | Status: DC

## 2016-08-12 MED ORDER — OXYCODONE HCL 10 MG PO TABS: 10.0000 mg | ORAL_TABLET | ORAL | 0 refills | Status: DC | PRN

## 2016-08-12 NOTE — Progress Notes (Signed)
[place]  [phone] [phone]     ID: Laura Matthews DOB: November 23, 1965  MR#: [id number]  FTD#:[id number]  Patient Care Team: [name], MD as PCP - General (Family Medicine) Stark Klein, MD as Consulting Physician (General Surgery) Chauncey Cruel, MD as Consulting Physician (Oncology) Thea Silversmith, MD as Consulting Physician (Radiation Oncology) [facility] Bunnie Pion, NP as Nurse Practitioner (Nurse Practitioner) Kyung Rudd, MD as Consulting Physician (Radiation Oncology)  CHIEF COMPLAINT: Stage III left breast cancer  CURRENT TREATMENT: [facility] care; out of facility DNR in place  INTERVAL HISTORY: Laura Matthews returns today for follow-up of her terminal breast cancer accompanied by her nephew Erlene Quan. During her recent hospitalization, Laura Matthews decided against further life prolonging treatments that would not improve her quality of life, and she accepted comfort care/hospice. She then moved in with her daughter. She tells me the hospice nurse visits very frequently.  REVIEW OF SYSTEMS:  Laura Matthews tells me her pain is not controlled. She is on methadone 3 tablets 3 times a day. This does not cause nausea or constipation. It just doesn't work for her. What was working for her was OxyContin, but she somehow misplaced 2 batches of tablets and at that point we switched her over to methadone. She does have oxycodone 10 mg for breakthrough pain and that works for her. Aside from the pain issue, she is very short of breath. She is using oxygen now 24/7. She is able to do a little bit of dishwashing and a little bit of laundry at times. She spends most of the day sleeping or watching TV. A detailed review of systems today was otherwise stable   BREAST CANCER HISTORY: From the original intake note:  The patient herself palpated a mass in her left breast and brought it to the attention of [facility] NP 04/22/2014. She palpated a large nontender mass at the 4:00  position as well as a small tender mass in the 6:00 position of the left breast. The patient was set up for imaging at the breast Center where on 05/05/2014 she underwent bilateral diagnostic mammography and left breast ultrasonography. The mammogram showed an irregular mass in the outer left breast with no other suspicious findings. This was firm and fixed at the 3:00 position approximately 10 cm from the nipple. Ultrasound showed a hypoechoic irregular solid mass in this area, measuring 1.8 cm. Ultrasound of the left axilla showed 2 suspicious level I lymph nodes, the largest measuring 1 cm, with a thickened pole. A smaller lymph node measured 0.6 cm but had no visible fatty hilum.  On 05/09/2014 the patient underwent biopsy of the left breast mass (she refused biopsy of the left axilla) which showed ([id number]) and invasive ductal carcinoma, grade 2, estrogen receptor positive at 100%, with strong staining intensity, progesterone receptor 80% positive, with weak staining intensity, with an MIB-1 of 80%, and no HER-2 amplification.  On 05/16/2014 the patient underwent bilateral breast MRI. This showed a 1.7 cm irregular enhancing mass at the 3:00 position of the left breast associated with the biopsy cleft. There were 3 level I left axillary lymph nodes with thickened cortices. The largest measured 1.3 cm. There were no other findings of concern.  Her treatment for stage III disease is detailed below.  METASTATIC DISEASE:  Laura Matthews presented to the emergency room 06/06/2016 complaining of shortness of breath and pain after an episode of syncope. CT angiogram of the chest raises suspicion for possible edema, infection, or carcinomatosis. There  were also numerous lytic lung lesions. Right pleural fluid cytology collected 06/08/2016 showed ([id number]) adenocarcinoma, estrogen receptor 90% positive, progesterone receptor negative, HER-2 not amplified with a signals ratio of 1.09 and number per cell 1.85.  Brain MRI was obtained 06/09/2016 and showed between 15 and 20 enhancing lesions consistent with metastatic disease, chiefly involving the cerebellum.  Her subsequent history is detailed below   PAST MEDICAL HISTORY: Past Medical History:  Diagnosis Date  . Anemia    low iron  . Arthritis   . Depression    pt denies   . Family history of malignant neoplasm of breast   . Head ache   . Hot flashes   . Lymphedema of breast    left  . Malignant neoplasm of breast (female), unspecified site    left breast  . Mental disorder    depression-  . Newly diagnosed diabetes 11/12/2014   chemo increasing blood glucose; not on meds now  . Radiation 04/22/15-06/23/15   Left Breast    PAST SURGICAL HISTORY: Past Surgical History:  Procedure Laterality Date  . BREAST LUMPECTOMY WITH SENTINEL LYMPH NODE BIOPSY Left 06/11/2014   Procedure: BREAST LUMPECTOMY WITH SENTINEL LYMPH NODE BX, POSSIBLE AXILLARY LYMPH NODE DISSECTION;  Surgeon: Stark Klein, MD;  Location: [place];  Service: General;  Laterality: Left;  . EVACUATION BREAST HEMATOMA Left 07/10/2014   Procedure: EVACUATION OF LEFT BREAST HEMATOMA ;  Surgeon: Stark Klein, MD;  Location: [place];  Service: General;  Laterality: Left;  . FOOT SURGERY  2011   rt  . INCISION AND DRAINAGE Left 07/02/2014   Procedure: Drainage left breast seroma;  Surgeon: Stark Klein, MD;  Location: [place];  Service: General;  Laterality: Left;  . INCISION AND DRAINAGE ABSCESS N/A 11/12/2014   Procedure: ASPIRATION AND INCISION AND DRAINAGE LEFT BREAST ABSCESS;  Surgeon: Michael Boston, MD;  Location: [facility];  Service: General;  Laterality: N/A;  . [place]   right  . PORT-A-CATH REMOVAL Left 07/22/2015   Procedure: REMOVAL PORT-A-CATH;  Surgeon: Stark Klein, MD;  Location: [place];  Service: General;  Laterality: Left;  . PORTACATH PLACEMENT N/A 07/02/2014   Procedure: INSERTION PORT-A-CATH;  Surgeon:  Stark Klein, MD;  Location: [place];  Service: General;  Laterality: N/A;  . TUBAL LIGATION    . UTERINE FIBROID SURGERY  2005   ablation    FAMILY HISTORY Family History  Problem Relation Age of Onset  . Breast cancer Mother 90    currently 40; TAH/BSO d/t ?cervical ca at 48  . Lung cancer Father     deceased 26  . Breast cancer Maternal Aunt     dx 45s; currently in her late 12s  . Diabetes Neg Hx    the patient's father died at the age of 20 from lung cancer. The patient's mother was diagnosed with breast cancer at the age of 19. She survives. The patient had 2 brothers, and 2 sisters. One brother may have a diagnosis of "stomach cancer"  GYNECOLOGIC HISTORY:  No LMP recorded. Patient has had an ablation. Menarche age 76 which is also when the patient first had a child. She is GX P3. She stopped having periods after laser ablation in 2005.  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 She has lived with her daughter [name] and her 4 children who are 26, 8, 5, and 4.  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 Currently the patient is living iOn her own, but sometimes she stays with her son Gilford Raid who works as a Dietitian in [place]. The patient's daughter [name] works as a Scientist, water quality, also in [place]. The patient has 7 grandchildren. She is not a church attender    ADVANCED DIRECTIVES: she has named her daughter Jadene Pierini. [place] as her healthcare power of attorney   HEALTH MAINTENANCE: Social History  Substance Use Topics  . Smoking status: Former Smoker    Packs/day: 0.25    Years: 20.00    Types: Cigarettes  . Smokeless tobacco: Never Used  . Alcohol use No     Comment: Percoset,Vicodin, Oxycotin      Allergies  Allergen Reactions  . Hydrocodone Itching and Nausea Only    Current Outpatient Prescriptions  Medication Sig Dispense Refill  . benzonatate (TESSALON) 100 MG  capsule Take 1 capsule (100 mg total) by mouth 3 (three) times daily as needed for cough. 20 capsule 0  . dexamethasone (DECADRON) 4 MG tablet Take 1 tablet (4 mg total) by mouth 2 (two) times daily. 120 tablet 0  . diphenhydrAMINE (BENADRYL) 25 mg capsule Take 1 capsule (25 mg total) by mouth every 6 (six) hours as needed for itching (Hold for sedation). 30 capsule 0  . docusate sodium (COLACE) 100 MG capsule Take 1 capsule (100 mg total) by mouth daily. 10 capsule 0  . feeding supplement, GLUCERNA SHAKE, (GLUCERNA SHAKE) LIQD Take 237 mLs by mouth 3 (three) times daily between meals. 90 Can 0  . glipiZIDE (GLUCOTROL XL) 5 MG 24 hr tablet Take 2 tablets (10 mg total) by mouth daily with breakfast. 120 tablet 12  . LORazepam (ATIVAN) 0.5 MG tablet Take 1 tablet (0.5 mg total) by mouth every 4 (four) hours as needed for anxiety. 30 tablet 0  . methadone (DOLOPHINE) 5 MG tablet Take 3 tablets (15 mg total) by mouth every 8 (eight) hours. 90 tablet 0  . mirtazapine (REMERON) 30 MG tablet Take 1 tablet (30 mg total) by mouth at bedtime. 30 tablet 3  . naproxen (NAPROSYN) 500 MG tablet Take 1 tablet (500 mg total) by mouth 2 (two) times daily with a meal.    . ondansetron (ZOFRAN ODT) 4 MG disintegrating tablet Take 1 tablet (4 mg total) by mouth every 8 (eight) hours as needed for nausea or vomiting. 30 tablet 0  . oxyCODONE (OXYCONTIN) 40 mg 12 hr tablet Take 1-2 tablets (40-80 mg total) by mouth every 12 (twelve) hours. 56 tablet 0  . Oxycodone HCl 10 MG TABS Take 1 tablet (10 mg total) by mouth every 4 (four) hours as needed. 60 tablet 0  . pantoprazole (PROTONIX) 20 MG tablet Take 2 tablets (40 mg total) by mouth daily. 60 tablet 0  . prochlorperazine (COMPAZINE) 10 MG tablet Take 1 tablet (10 mg total) by mouth every 6 (six) hours as needed for nausea or vomiting. 30 tablet 0   No current facility-administered medications for this visit.     OBJECTIVE: Middle-aged [place] American woman Examined  in a wheelchair Vitals:   08/12/16 1301  BP: (!) 157/84  Pulse: (!) 104  Resp: 18  Temp: 98.1 F (36.7 C)     Body mass index is 22.3 kg/m.    ECOG FS:3 - Symptomatic, >50% confined to bed  Sclerae unicteric, EOMs intact Oropharynx clear, slightly dry No cervical or supraclavicular adenopathy Lungs no rales or rhonchi, coughs. Exam  Heart regular rate and rhythm Abd soft, nontender, positive bowel sounds MSK no upper extremity lymphedema Neuro: nonfocal, well oriented, appropriate affect Breasts: Deferred   LAB RESULTS:  CMP     Component Value Date/Time   NA 134 (L) 08/12/2016 1231   K 3.5 08/12/2016 1231   CL 96 (L) 08/02/2016 0413   CO2 20 (L) 08/12/2016 1231   GLUCOSE 409 (H) 08/12/2016 1231   BUN 17.7 08/12/2016 1231   CREATININE 0.8 08/12/2016 1231   CALCIUM 9.2 08/12/2016 1231   PROT 6.6 08/12/2016 1231   ALBUMIN 3.1 (L) 08/12/2016 1231   AST 14 08/12/2016 1231   ALT 35 08/12/2016 1231   ALKPHOS 104 08/12/2016 1231   BILITOT 0.25 08/12/2016 1231   GFRNONAA >60 08/02/2016 0413   GFRAA >60 08/02/2016 0413    I No results found for: SPEP  Lab Results  Component Value Date   WBC 6.1 08/12/2016   NEUTROABS 5.3 08/12/2016   HGB 12.6 08/12/2016   HCT 37.1 08/12/2016   MCV 89.2 08/12/2016   PLT 155 08/12/2016      Chemistry      Component Value Date/Time   NA 134 (L) 08/12/2016 1231   K 3.5 08/12/2016 1231   CL 96 (L) 08/02/2016 0413   CO2 20 (L) 08/12/2016 1231   BUN 17.7 08/12/2016 1231   CREATININE 0.8 08/12/2016 1231      Component Value Date/Time   CALCIUM 9.2 08/12/2016 1231   ALKPHOS 104 08/12/2016 1231   AST 14 08/12/2016 1231   ALT 35 08/12/2016 1231   BILITOT 0.25 08/12/2016 1231       Lab Results  Component Value Date   LABCA2 1,356.9 (H) 06/08/2016    No components found for: [id number]  No results for input(s): INR in the last 168 hours.  Urinalysis    Component Value Date/Time   COLORURINE YELLOW 07/09/2016 0057    APPEARANCEUR CLEAR 07/09/2016 0057   LABSPEC 1.026 07/09/2016 0057   PHURINE 6.5 07/09/2016 0057   GLUCOSEU NEGATIVE 07/09/2016 0057   HGBUR NEGATIVE 07/09/2016 0057   BILIRUBINUR NEGATIVE 07/09/2016 0057   KETONESUR NEGATIVE 07/09/2016 0057   PROTEINUR NEGATIVE 07/09/2016 0057   UROBILINOGEN 0.2 06/03/2014 1050   NITRITE NEGATIVE 07/09/2016 0057   LEUKOCYTESUR NEGATIVE 07/09/2016 0057    STUDIES: Ct Angio Chest Pe W Or Wo Contrast  Result Date: 07/31/2016 CLINICAL DATA:  She reports that she has had progressively worsening and persistent cough. She is very short of breath. Her chest xray confirms worsening metastatic disease. She denies fever and chills. She reports cough nonproductive. Patient reports that she gets extremely short of breath when she stands and walks. Hx of breast ca dx 05/2014 lt sided lumpectomy with chemo in process and XRT completed EXAM: CT ANGIOGRAPHY CHEST WITH CONTRAST TECHNIQUE: Multidetector CT imaging of the chest was performed using the standard protocol during bolus administration of intravenous contrast. Multiplanar CT image reconstructions and MIPs were obtained to evaluate the vascular anatomy. CONTRAST:  100 mL of Isovue 370 intravenous contrast COMPARISON:  Current chest radiograph.  CTA of the chest, 07/09/2016 FINDINGS: Cardiovascular: No evidence of a pulmonary embolism. Heart is normal in size and configuration. No significant coronary artery calcifications. Great vessels are normal in caliber. No aortic dissection. No atherosclerosis. Mediastinum/Nodes: No mediastinal or hilar masses. No discrete enlarged lymph nodes. Increased soft tissue attenuation along both hila. Trace pericardial fluid. Lungs/Pleura: Small bilateral pleural effusions. Patchy ground-glass opacities noted in both lungs, predominantly in the  upper lobes. There is interstitial thickening that predominates in the lower lungs. No lung mass or nodule. No pneumothorax. Upper Abdomen: Hepatomegaly  with extensive fatty infiltration of the liver. No acute findings. Musculoskeletal: Mild depressions of the upper endplates of T4 T6 and T9. Multiple mixed sclerotic and lytic lesions throughout the spine. There is a small seroma with associated vascular clips in the left breast. There is left breast skin thickening. Findings consistent with treatment for the known breast carcinoma. Findings are stable. Review of the MIP images confirms the above findings. IMPRESSION: 1. No evidence of a pulmonary embolism. 2. Pleural effusions, interstitial thickening and bilateral areas of ground-glass lung opacity. This is most likely due to pulmonary edema. Lung infection or inflammation is possible. Electronically Signed   By: Lajean Manes M.D.   On: 07/31/2016 12:44   Dg Chest Port 1 View  Result Date: 07/31/2016 CLINICAL DATA:  Cough since June. History of metastatic breast cancer to lungs, brain, and bone. Generalized pain for 3 days. History of diabetes. EXAM: PORTABLE CHEST 1 VIEW COMPARISON:  CT chest 07/09/2016.  Chest 07/09/2016. FINDINGS: Shallow inspiration. Normal heart size and pulmonary vascularity. Patchy infiltrates demonstrated throughout both lungs demonstrating progression since previous study. This could represent edema or multifocal pneumonia. Interstitial metastatic spread not excluded. No blunting of costophrenic angles. No pneumothorax. Surgical clips in the left axilla. IMPRESSION: Progressing interstitial infiltrates throughout both lungs since previous study. No focal consolidation. Electronically Signed   By: Lucienne Capers M.D.   On: 07/31/2016 05:36    ASSESSMENT: 50 y.o. [place] woman status post left breast upper outer quadrant biopsy 05/09/2014 for a clinicalT1c N1, stage IIA invasive ductal carcinoma, grade 2, estrogen and progesterone receptor positive, HER-2 nonamplified, with an MIB-1 of 80%  (1) patient met with genetics counselor 05/29/2014 but decided against genetics  testing   (2) tobacco abuse. The patient has been strongly urged to discontinue smoking. She was prescribed a nicotine patch 09/02/2014  (3) status post left lumpectomy and axillary lymph node dissection 06/11/2014 for a pT1c pN3, stage IIIC invasive ductal carcinoma, grade 3, with repeat HER-2 negative  (4) adjuvant chemotherapy consisting of doxorubicin and cyclophosphamide in dose dense fashion x4, completed 09/09/2014, followed by paclitaxel weekly x12 completed 03/09/2015  (5) adjuvant radiation 04/22/15-06/23/15 Left breast/ 45 Gy at 1.8 Gy per fraction x 25 fractions.  Left supraclavicular fossa and axilla/ 45 Gy at 1.8 Gy per fraction x 25 fractions Left breast boost/ 16 Gy at 2 Gy per fraction x 8 fractions  (6) tamoxifen started 07/01/2015, discontinued 06/09/2016 with evidence of disease progression  METASTATIC DISEASE: involving brain, lungs and bone documented August 2017 (7) CT angio 06/06/2016 c/w lymphangitic carcinomatosis and lytic bone lesions                 (a) right thoracentesis 06/08/2016-- cytology confirms adenocarcinoma, estrogen receptor positive                 (b) brain MRI 06/09/2016 shows multiple metastases   (c) CA 27-29 is informative  (8) starting letrozole 06/09/2016; palbociclib added 06/24/2016  (9) radiation to CNS and spine in 10 fractions 06/13/2016 through 06/27/2016  (10) poorly controlled pain:   (a) initially treated with oxycontin BID with OxyIR PRN, discontinued 07/25/2016 with the patient unable to keep her pain management agreement (multiple episodes of loss medications, need for early refills, not calling with medication changes, etc.  (b) started on methadone 10 mg by mouth 3 times a day  as of 07/25/2016.  (11) denosumab/Xgeva started 07/15/2016, discontinued as patient opted for comfort care  (12) as of 08/02/2016 patient opted for comfort/palliative care under the care of hospice.   PLAN: Laura Matthews is now living with her  daughter, which is providing a measure of stability. She is also under hospice care and is frequently visited.  Accordingly I think we may be able to go back to the OxyContin tablets, which she tells me work much better for her. Of course the reason we stop this that there were too many refills too soon into many "accidents" when the pills were lost or still N or somehow misplaced. If that continues obviously we will not be able to refill that medication for her  She does not have any side effects from the methadone. She just says it doesn't work for her. We're going to continue it at the same dose, 15 mg 3 times a day. Today I wrote her for OxyContin 40 mg to take 1 or 2 tablets twice daily. She understands if she takes too much she will become very sleepy and maybe, hot hard to wake up. This has not been a problem in the past however. She has never had problems with constipation despite the narcotics.  I anticipate that if the cancer in her lungs progress as she may need to be at [facility] for the final week or so as they will be very difficult to help her through the feeling of drowning without more help then she is likely to be able to obtain at home.  I have made her a return appointment with me in about 6 weeks. She will call hospice first with any problems that she may experience before then and of course they will contact us with any questions or concerns.   Chauncey Cruel, MD 08/12/16

## 2016-08-13 LAB — CANCER ANTIGEN 27.29

## 2016-08-14 ENCOUNTER — Other Ambulatory Visit: Payer: Self-pay | Admitting: Oncology

## 2016-08-17 ENCOUNTER — Telehealth: Payer: Self-pay

## 2016-08-17 NOTE — Telephone Encounter (Signed)
Laura Matthews with hospice called stating Dr Lyman Speller of hospice wanted her to check with Dr [name] about long term pain medication. Dr Lyman Speller had rx oxycodone and methadone. Someone else had rx oxycontin. Dr Lyman Speller felt oxycontin and methadone were incompatible. He is asking which Dr [name] wants them to use.  Also if it would be ok to consider a fentanyl patch. S/w Dr [name] and he agrees with the methadone. He also stated fentanyl would be OK. Dr [name] stated Dr Lyman Speller can handle pain medications.  S/w Laura Matthews per above.

## 2016-08-18 ENCOUNTER — Telehealth: Payer: Self-pay | Admitting: *Deleted

## 2016-09-21 ENCOUNTER — Ambulatory Visit (HOSPITAL_BASED_OUTPATIENT_CLINIC_OR_DEPARTMENT_OTHER): Payer: Medicaid Other | Admitting: Oncology

## 2016-09-21 VITALS — BP 141/92 | HR 123 | Temp 98.4°F | Resp 17 | Ht 70.0 in | Wt 151.0 lb

## 2016-09-21 DIAGNOSIS — C7802 Secondary malignant neoplasm of left lung: Secondary | ICD-10-CM | POA: Diagnosis not present

## 2016-09-21 DIAGNOSIS — B379 Candidiasis, unspecified: Secondary | ICD-10-CM

## 2016-09-21 DIAGNOSIS — C50412 Malignant neoplasm of upper-outer quadrant of left female breast: Secondary | ICD-10-CM

## 2016-09-21 DIAGNOSIS — C7931 Secondary malignant neoplasm of brain: Secondary | ICD-10-CM | POA: Diagnosis not present

## 2016-09-21 DIAGNOSIS — G893 Neoplasm related pain (acute) (chronic): Secondary | ICD-10-CM

## 2016-09-21 DIAGNOSIS — C7951 Secondary malignant neoplasm of bone: Secondary | ICD-10-CM | POA: Diagnosis not present

## 2016-09-21 DIAGNOSIS — C50919 Malignant neoplasm of unspecified site of unspecified female breast: Secondary | ICD-10-CM

## 2016-09-21 DIAGNOSIS — Z170 Estrogen receptor positive status [ER+]: Secondary | ICD-10-CM

## 2016-09-21 MED ORDER — METHADONE HCL 5 MG PO TABS: 5.0000 mg | ORAL_TABLET | Freq: Three times a day (TID) | ORAL | 0 refills | Status: AC

## 2016-09-21 MED ORDER — PANTOPRAZOLE SODIUM 20 MG PO TBEC: 40.0000 mg | DELAYED_RELEASE_TABLET | Freq: Every day | ORAL | 0 refills | Status: AC

## 2016-09-21 MED ORDER — PROCHLORPERAZINE MALEATE 10 MG PO TABS: 10.0000 mg | ORAL_TABLET | Freq: Four times a day (QID) | ORAL | 0 refills | Status: AC | PRN

## 2016-09-21 MED ORDER — FLUCONAZOLE 100 MG PO TABS: 100.0000 mg | ORAL_TABLET | Freq: Every day | ORAL | 3 refills | Status: AC

## 2016-09-21 NOTE — Progress Notes (Signed)
[facility]  [phone]) [phone] [phone]) [phone]     ID: [name] DOB: 03-08-1966  MR#: [id number]  NLZ#:[id number]  Patient Care Team: [name], MD as PCP - General (Family Medicine) Stark Klein, MD as Consulting Physician (General Surgery) Chauncey Cruel, MD as Consulting Physician (Oncology) Thea Silversmith, MD as Consulting Physician (Radiation Oncology) [facility] Bunnie Pion, NP as Nurse Practitioner (Nurse Practitioner) Kyung Rudd, MD as Consulting Physician (Radiation Oncology)  CHIEF COMPLAINT: Stage III left breast cancer  CURRENT TREATMENT: [facility]; out of facility DNR in place  INTERVAL HISTORY: Ananiah returns today for follow-up of her terminal breast cancer accompanied by her daughter Candice. Tye [place] is under the care of hospice, but there are some unusual problems. She has run out of pain medicine. She is still on high-dose dexamethasone, which was supposed to have been tapered. She doesn't have any nausea medicine. This is unusual because they tell me there is a hospice nurse that does come on a weekly basis. Usually the hospice nurse make sure if the patient's medicines are up-to-date and of course reviews them every visit  REVIEW OF SYSTEMS:  Zyia is not controlled, since she is not on any pain medicine at present. She is not having problems with bowel movements. She has a bad taste in her mouth, has a sore throat, and has been eating less lately, although her weight is fairly stable. Her daughter has written me a note telling me that she gets her mother the pain medicines because her mother is too confused to take them appropriately. There has been no cough, phlegm production, or pleurisy. Have been no persistent headaches. A detailed review of systems today was otherwise stable    BREAST CANCER HISTORY: From the original intake note:  The patient herself palpated a mass in her left breast and brought it to the attention of  [facility] NP 04/22/2014. She palpated a large nontender mass at the 4:00 position as well as a small tender mass in the 6:00 position of the left breast. The patient was set up for imaging at the breast Center where on 05/05/2014 she underwent bilateral diagnostic mammography and left breast ultrasonography. The mammogram showed an irregular mass in the outer left breast with no other suspicious findings. This was firm and fixed at the 3:00 position approximately 10 cm from the nipple. Ultrasound showed a hypoechoic irregular solid mass in this area, measuring 1.8 cm. Ultrasound of the left axilla showed 2 suspicious level I lymph nodes, the largest measuring 1 cm, with a thickened pole. A smaller lymph node measured 0.6 cm but had no visible fatty hilum.  On 05/09/2014 the patient underwent biopsy of the left breast mass (she refused biopsy of the left axilla) which showed ([id number]) and invasive ductal carcinoma, grade 2, estrogen receptor positive at 100%, with strong staining intensity, progesterone receptor 80% positive, with weak staining intensity, with an MIB-1 of 80%, and no HER-2 amplification.  On 05/16/2014 the patient underwent bilateral breast MRI. This showed a 1.7 cm irregular enhancing mass at the 3:00 position of the left breast associated with the biopsy cleft. There were 3 level I left axillary lymph nodes with thickened cortices. The largest measured 1.3 cm. There were no other findings of concern.  Her treatment for stage III disease is detailed below.  METASTATIC DISEASE:  Kollins presented to the emergency room 06/06/2016 complaining of shortness of breath and pain after an episode of syncope. CT angiogram of the  chest raises suspicion for possible edema, infection, or carcinomatosis. There were also numerous lytic lung lesions. Right pleural fluid cytology collected 06/08/2016 showed ([id number]) adenocarcinoma, estrogen receptor 90% positive, progesterone receptor negative,  HER-2 not amplified with a signals ratio of 1.09 and number per cell 1.85. Brain MRI was obtained 06/09/2016 and showed between 15 and 20 enhancing lesions consistent with metastatic disease, chiefly involving the cerebellum.  Her subsequent history is detailed below   PAST MEDICAL HISTORY: Past Medical History:  Diagnosis Date  . Anemia    low iron  . Arthritis   . Depression    pt denies   . Family history of malignant neoplasm of breast   . Head ache   . Hot flashes   . Lymphedema of breast    left  . Malignant neoplasm of breast (female), unspecified site    left breast  . Mental disorder    depression-  . Newly diagnosed diabetes 11/12/2014   chemo increasing blood glucose; not on meds now  . Radiation 04/22/15-06/23/15   Left Breast    PAST SURGICAL HISTORY: Past Surgical History:  Procedure Laterality Date  . BREAST LUMPECTOMY WITH SENTINEL LYMPH NODE BIOPSY Left 06/11/2014   Procedure: BREAST LUMPECTOMY WITH SENTINEL LYMPH NODE BX, POSSIBLE AXILLARY LYMPH NODE DISSECTION;  Surgeon: Stark Klein, MD;  Location: [facility];  Service: General;  Laterality: Left;  . EVACUATION BREAST HEMATOMA Left 07/10/2014   Procedure: EVACUATION OF LEFT BREAST HEMATOMA ;  Surgeon: Stark Klein, MD;  Location: [facility];  Service: General;  Laterality: Left;  . FOOT SURGERY  2011   rt  . INCISION AND DRAINAGE Left 07/02/2014   Procedure: Drainage left breast seroma;  Surgeon: Stark Klein, MD;  Location: [facility];  Service: General;  Laterality: Left;  . INCISION AND DRAINAGE ABSCESS N/A 11/12/2014   Procedure: ASPIRATION AND INCISION AND DRAINAGE LEFT BREAST ABSCESS;  Surgeon: Michael Boston, MD;  Location: [facility];  Service: General;  Laterality: N/A;  . [place]   right  . PORT-A-CATH REMOVAL Left 07/22/2015   Procedure: REMOVAL PORT-A-CATH;  Surgeon: Stark Klein, MD;  Location: [facility];  Service: General;  Laterality: Left;  .  PORTACATH PLACEMENT N/A 07/02/2014   Procedure: INSERTION PORT-A-CATH;  Surgeon: Stark Klein, MD;  Location: [facility];  Service: General;  Laterality: N/A;  . TUBAL LIGATION    . UTERINE FIBROID SURGERY  2005   ablation    FAMILY HISTORY Family History  Problem Relation Age of Onset  . Breast cancer Mother 74    currently 15; TAH/BSO d/t ?cervical ca at 54  . Lung cancer Father     deceased 34  . Breast cancer Maternal Aunt     dx 16s; currently in her late 40s  . Diabetes Neg Hx    the patient's father died at the age of 78 from lung cancer. The patient's mother was diagnosed with breast cancer at the age of 20. She survives. The patient had 2 brothers, and 2 sisters. One brother may have a diagnosis of "stomach cancer"  GYNECOLOGIC HISTORY:  No LMP recorded. Patient has had an ablation. Menarche age 65 which is also when the patient first had a child. She is GX P3. She stopped having periods after laser ablation in 2005.  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 She has lived with her daughter [name] and  her 4 children who are 11, 8, 5, and 4. 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 Currently the patient is living iOn her own, but sometimes she stays with her son Gilford Raid who works as a Dietitian in [place]. The patient's daughter [name] works as a Scientist, water quality, also in [employer]. The patient has 7 grandchildren. She is not a church attender    ADVANCED DIRECTIVES: she has named her daughter Jadene Pierini. [place] as her healthcare power of attorney   HEALTH MAINTENANCE: Social History  Substance Use Topics  . Smoking status: Former Smoker    Packs/day: 0.25    Years: 20.00    Types: Cigarettes  . Smokeless tobacco: Never Used  . Alcohol use No     Comment: Percoset,Vicodin, Oxycotin      Allergies  Allergen Reactions  . Hydrocodone Itching and Nausea Only    Current Outpatient  Prescriptions  Medication Sig Dispense Refill  . benzonatate (TESSALON) 100 MG capsule Take 1 capsule (100 mg total) by mouth 3 (three) times daily as needed for cough. 20 capsule 0  . diphenhydrAMINE (BENADRYL) 25 mg capsule Take 1 capsule (25 mg total) by mouth every 6 (six) hours as needed for itching (Hold for sedation). 30 capsule 0  . docusate sodium (COLACE) 100 MG capsule Take 1 capsule (100 mg total) by mouth daily. 10 capsule 0  . feeding supplement, GLUCERNA SHAKE, (GLUCERNA SHAKE) LIQD Take 237 mLs by mouth 3 (three) times daily between meals. 90 Can 0  . fluconazole (DIFLUCAN) 100 MG tablet Take 1 tablet (100 mg total) by mouth daily. 30 tablet 3  . glipiZIDE (GLUCOTROL XL) 5 MG 24 hr tablet Take 2 tablets (10 mg total) by mouth daily with breakfast. 120 tablet 12  . LORazepam (ATIVAN) 0.5 MG tablet Take 1 tablet (0.5 mg total) by mouth every 4 (four) hours as needed for anxiety. 30 tablet 0  . methadone (DOLOPHINE) 5 MG tablet Take 1 tablet (5 mg total) by mouth every 8 (eight) hours. 90 tablet 0  . mirtazapine (REMERON) 30 MG tablet Take 1 tablet (30 mg total) by mouth at bedtime. 30 tablet 3  . naproxen (NAPROSYN) 500 MG tablet Take 1 tablet (500 mg total) by mouth 2 (two) times daily with a meal.    . ondansetron (ZOFRAN ODT) 4 MG disintegrating tablet Take 1 tablet (4 mg total) by mouth every 8 (eight) hours as needed for nausea or vomiting. 30 tablet 0  . oxyCODONE (OXYCONTIN) 40 mg 12 hr tablet Take 1-2 tablets (40-80 mg total) by mouth every 12 (twelve) hours. 56 tablet 0  . Oxycodone HCl 10 MG TABS Take 1 tablet (10 mg total) by mouth every 4 (four) hours as needed. 60 tablet 0  . pantoprazole (PROTONIX) 20 MG tablet Take 2 tablets (40 mg total) by mouth daily. 60 tablet 0  . prochlorperazine (COMPAZINE) 10 MG tablet Take 1 tablet (10 mg total) by mouth every 6 (six) hours as needed for nausea or vomiting. 30 tablet 0   No current facility-administered medications for this  visit.     OBJECTIVE: Middle-aged African American woman  Vitals:   09/21/16 1047  BP: (!) 141/92  Pulse: (!) 123  Resp: 17  Temp: 98.4 F (36.9 C)     Body mass index is 21.67 kg/m.    ECOG FS:2 - Symptomatic, <50% confined to bed  Sclerae unicteric, pupils round and equal Oropharynx shows obvious thrush Lungs no rales or rhonchi Heart  regular rate and rhythm Abd soft, nontender, positive bowel sounds MSK no upper extremity lymphedema Neuro: nonfocal, mildly confused depressed affect Breasts: Deferred   LAB RESULTS:  CMP     Component Value Date/Time   NA 134 (L) 08/12/2016 1231   K 3.5 08/12/2016 1231   CL 96 (L) 08/02/2016 0413   CO2 20 (L) 08/12/2016 1231   GLUCOSE 409 (H) 08/12/2016 1231   BUN 17.7 08/12/2016 1231   CREATININE 0.8 08/12/2016 1231   CALCIUM 9.2 08/12/2016 1231   PROT 6.6 08/12/2016 1231   ALBUMIN 3.1 (L) 08/12/2016 1231   AST 14 08/12/2016 1231   ALT 35 08/12/2016 1231   ALKPHOS 104 08/12/2016 1231   BILITOT 0.25 08/12/2016 1231   GFRNONAA >60 08/02/2016 0413   GFRAA >60 08/02/2016 0413    I No results found for: SPEP  Lab Results  Component Value Date   WBC 6.1 08/12/2016   NEUTROABS 5.3 08/12/2016   HGB 12.6 08/12/2016   HCT 37.1 08/12/2016   MCV 89.2 08/12/2016   PLT 155 08/12/2016      Chemistry      Component Value Date/Time   NA 134 (L) 08/12/2016 1231   K 3.5 08/12/2016 1231   CL 96 (L) 08/02/2016 0413   CO2 20 (L) 08/12/2016 1231   BUN 17.7 08/12/2016 1231   CREATININE 0.8 08/12/2016 1231      Component Value Date/Time   CALCIUM 9.2 08/12/2016 1231   ALKPHOS 104 08/12/2016 1231   AST 14 08/12/2016 1231   ALT 35 08/12/2016 1231   BILITOT 0.25 08/12/2016 1231       Lab Results  Component Value Date   LABCA2 1,356.9 (H) 06/08/2016    No components found for: NOBSJ628  No results for input(s): INR in the last 168 hours.  Urinalysis    Component Value Date/Time   COLORURINE YELLOW 07/09/2016 0057    APPEARANCEUR CLEAR 07/09/2016 0057   LABSPEC 1.026 07/09/2016 0057   PHURINE 6.5 07/09/2016 0057   GLUCOSEU NEGATIVE 07/09/2016 0057   HGBUR NEGATIVE 07/09/2016 0057   BILIRUBINUR NEGATIVE 07/09/2016 0057   KETONESUR NEGATIVE 07/09/2016 0057   PROTEINUR NEGATIVE 07/09/2016 0057   UROBILINOGEN 0.2 06/03/2014 1050   NITRITE NEGATIVE 07/09/2016 0057   LEUKOCYTESUR NEGATIVE 07/09/2016 0057    STUDIES: No results found.  ASSESSMENT: 50 y.o. [place] woman status post left breast upper outer quadrant biopsy 05/09/2014 for a clinicalT1c N1, stage IIA invasive ductal carcinoma, grade 2, estrogen and progesterone receptor positive, HER-2 nonamplified, with an MIB-1 of 80%  (1) patient met with genetics counselor 05/29/2014 but decided against genetics testing   (2) tobacco abuse. The patient has been strongly urged to discontinue smoking. She was prescribed a nicotine patch 09/02/2014  (3) status post left lumpectomy and axillary lymph node dissection 06/11/2014 for a pT1c pN3, stage IIIC invasive ductal carcinoma, grade 3, with repeat HER-2 negative  (4) adjuvant chemotherapy consisting of doxorubicin and cyclophosphamide in dose dense fashion x4, completed 09/09/2014, followed by paclitaxel weekly x12 completed 03/09/2015  (5) adjuvant radiation 04/22/15-06/23/15 Left breast/ 45 Gy at 1.8 Gy per fraction x 25 fractions.  Left supraclavicular fossa and axilla/ 45 Gy at 1.8 Gy per fraction x 25 fractions Left breast boost/ 16 Gy at 2 Gy per fraction x 8 fractions  (6) tamoxifen started 07/01/2015, discontinued 06/09/2016 with evidence of disease progression  METASTATIC DISEASE: involving brain, lungs and bone documented August 2017 (7) CT angio 06/06/2016 c/w lymphangitic carcinomatosis and lytic bone  lesions                 (a) right thoracentesis 06/08/2016-- cytology confirms adenocarcinoma, estrogen receptor positive                 (b) brain MRI 06/09/2016 shows multiple  metastases   (c) CA 27-29 is informative  (8) starting letrozole 06/09/2016; palbociclib added 06/24/2016  (9) radiation to CNS and spine in 10 fractions 06/13/2016 through 06/27/2016  (10) poorly controlled pain:   (a) initially treated with oxycontin BID with OxyIR PRN, discontinued 07/25/2016 with the patient unable to keep her pain management agreement (multiple episodes of loss medications, need for early refills, not calling with medication changes, etc.  (b) started on methadone 10 mg by mouth 3 times a day as of 07/25/2016.  (11) denosumab/Xgeva started 07/15/2016, discontinued as patient opted for comfort care  (12) as of 08/02/2016 patient opted for comfort/palliative care under the care of hospice.   PLAN: I could not figure out why a patient on hospice should have run out of pain medicine or have untreated thrush. Accordingly I called the hospice medical director. It appears at discharge the patient's primary care physician was placed on a hospice M.D. which is not what we usually do. Usually the cancer Dr. becomes a hospice M.D. They will try to correct this.  Once a correct as the patient can be restarted on methadone, initially at 5 mg 3 times daily, and after week 10 mg 3 times daily, and after 2 weeks on that dose her dose can be increased further to 15 mg 3 times daily if needed. They may continue to use oxycodone if necessary for breakthrough pain.   The patient's steroids should be decreased to 1 tablet every morning for a week, then a half a tablet for 2 weeks, then every other day for 2 weeks then off. In the meantime she will start Diflucan daily and continue until her thrush has completely resolved.  I am going to see Sanayah late December just to make sure everything is in place. She knows to call for any other problems that may develop before her next visit here.   Chauncey Cruel, MD 09/24/16

## 2016-09-26 ENCOUNTER — Encounter (HOSPITAL_COMMUNITY): Payer: Self-pay | Admitting: Nurse Practitioner

## 2016-09-26 ENCOUNTER — Emergency Department (HOSPITAL_COMMUNITY)
Admission: EM | Admit: 2016-09-26 | Discharge: 2016-09-26 | Disposition: A | Discharge status: Home / Self Care | Payer: Medicaid Other | Attending: Emergency Medicine | Admitting: Emergency Medicine

## 2016-09-26 DIAGNOSIS — G893 Neoplasm related pain (acute) (chronic): Secondary | ICD-10-CM | POA: Diagnosis present

## 2016-09-26 DIAGNOSIS — C50912 Malignant neoplasm of unspecified site of left female breast: Secondary | ICD-10-CM | POA: Insufficient documentation

## 2016-09-26 DIAGNOSIS — Z87891 Personal history of nicotine dependence: Secondary | ICD-10-CM | POA: Diagnosis not present

## 2016-09-26 LAB — I-STAT CHEM 8, ED
BUN: 18 mg/dL (ref 6–20)
CALCIUM ION: 1.07 mmol/L — AB (ref 1.15–1.40)
CREATININE: 0.9 mg/dL (ref 0.44–1.00)
Chloride: 102 mmol/L (ref 101–111)
GLUCOSE: 266 mg/dL — AB (ref 65–99)
HCT: 36 % (ref 36.0–46.0)
HEMOGLOBIN: 12.2 g/dL (ref 12.0–15.0)
Potassium: 3.3 mmol/L — ABNORMAL LOW (ref 3.5–5.1)
Sodium: 138 mmol/L (ref 135–145)
TCO2: 25 mmol/L (ref 0–100)

## 2016-09-26 MED ORDER — HYDROMORPHONE HCL 1 MG/ML IJ SOLN
1.0000 mg | Freq: Once | INTRAMUSCULAR | Status: AC
  Administered 2016-09-26: 1 mg via INTRAVENOUS
  Filled 2016-09-26: qty 1

## 2016-09-26 MED ORDER — OXYCODONE-ACETAMINOPHEN 5-325 MG PO TABS: 2.0000 | ORAL_TABLET | ORAL | 0 refills | Status: DC | PRN

## 2016-09-26 MED ORDER — METHADONE HCL 5 MG PO TABS: 5.0000 mg | ORAL_TABLET | Freq: Three times a day (TID) | ORAL | 0 refills | Status: AC

## 2016-09-26 MED ORDER — ONDANSETRON HCL 4 MG/2ML IJ SOLN
4.0000 mg | Freq: Once | INTRAMUSCULAR | Status: AC
  Administered 2016-09-26: 4 mg via INTRAVENOUS
  Filled 2016-09-26: qty 2

## 2016-09-26 NOTE — ED Triage Notes (Signed)
Patient has not had all of her medication in 4 days. Patient states she forgets to have it filled. In the process of switching over to hospice but has not made the full transition. Having increase weakness and pain.

## 2016-09-26 NOTE — ED Notes (Signed)
Bed: WA08 Expected date:  Expected time:  Means of arrival:  Comments: Ems cancer/weak

## 2016-09-26 NOTE — Discharge Instructions (Signed)
We believe that your symptoms are caused by musculoskeletal strain.  Please read through the included information about additional care such as heating pads, over-the-counter pain medicine.  If you were provided a prescription please use it only as needed and as instructed.  Remember that early mobility and using the affected part of your body is actually better than keeping it immobile.  You should call your hospice team for further treatment and refill of your pain medication [facility] term.   Follow-up with the doctor listed as recommended or return to the emergency department with new or worsening symptoms that concern you.

## 2016-09-26 NOTE — ED Provider Notes (Signed)
Emergency Department Provider Note   I have reviewed the triage vital signs and the nursing notes.   HISTORY  Chief Complaint No chief complaint on file.   HPI Laura Matthews is a 50 y.o. female with PMH of metastatic breast cancer now converting to hospice care presents to the emergency department for evaluation of intractable diffuse pain. The patient reports severe pain in the setting of losing her methadone. The patient states that 2 days ago she knocked her medication into the toilet. She is been without pain medication all weekend reports diffuse pain. She reports a fall with an abrasion to her chin but this is her only acute painful injury. Denies fever, chills, or difficulty breathing.   Patient states that she was just prescribed this medication by her oncologist Dr. [name] on 11/22. Reports also being out of nausea medication but cannot recall the name.    Past Medical History:  Diagnosis Date  . Anemia    low iron  . Arthritis   . Depression    pt denies   . Family history of malignant neoplasm of breast   . Head ache   . Hot flashes   . Lymphedema of breast    left  . Malignant neoplasm of breast (female), unspecified site    left breast  . Mental disorder    depression-  . Newly diagnosed diabetes 11/12/2014   chemo increasing blood glucose; not on meds now  . Radiation 04/22/15-06/23/15   Left Breast    Patient Active Problem List   Diagnosis Date Noted  . Advance care planning   . Goals of care, counseling/discussion   . Neoplasm related pain   . Palliative care by specialist   . Respiratory failure with hypoxia ([place]) 07/31/2016  . Acute on chronic respiratory failure with hypoxia ([place]) 07/31/2016  . Metastatic breast cancer ([place]) 07/31/2016  . Cancer, metastatic to bone ([place]) 06/10/2016  . Brain metastasis ([place])   . Protein-calorie malnutrition, severe 06/09/2016  . Pleural effusion   . S/P thoracentesis   . Acute respiratory failure with  hypoxia ([place]) 06/07/2016  . Back pain 06/07/2016  . Protein-calorie malnutrition, moderate ([place]) 06/07/2016  . Cough   . Hypoxia   . Lung metastases ([place])   . Shortness of breath   . Bronchitis 05/25/2016  . Chest pain 02/02/2015  . Depression 01/26/2015  . Abscess of left breast 11/12/2014  . Diabetes mellitus without complication ([place]) 59/56/3875  . Neutropenia ([place]) 08/05/2014  . Lymphedema of breast 08/05/2014  . Breast cancer metastasized to axillary lymph node ([place]) 06/11/2014  . Family history of malignant neoplasm of breast   . Breast cancer of upper-outer quadrant of left female breast ([place]) 05/19/2014  . Opiate dependence ([place]) 05/01/2012    Class: Acute  . Homeless 05/01/2012    Class: Acute    Past Surgical History:  Procedure Laterality Date  . BREAST LUMPECTOMY WITH SENTINEL LYMPH NODE BIOPSY Left 06/11/2014   Procedure: BREAST LUMPECTOMY WITH SENTINEL LYMPH NODE BX, POSSIBLE AXILLARY LYMPH NODE DISSECTION;  Surgeon: Stark Klein, MD;  Location: [facility];  Service: General;  Laterality: Left;  . EVACUATION BREAST HEMATOMA Left 07/10/2014   Procedure: EVACUATION OF LEFT BREAST HEMATOMA ;  Surgeon: Stark Klein, MD;  Location: [facility];  Service: General;  Laterality: Left;  . FOOT SURGERY  2011   rt  . INCISION AND DRAINAGE Left 07/02/2014   Procedure: Drainage left breast seroma;  Surgeon: Stark Klein, MD;  Location: [facility];  Service: General;  Laterality: Left;  . INCISION AND DRAINAGE ABSCESS N/A 11/12/2014   Procedure: ASPIRATION AND INCISION AND DRAINAGE LEFT BREAST ABSCESS;  Surgeon: Michael Boston, MD;  Location: [facility];  Service: General;  Laterality: N/A;  . [place]   right  . PORT-A-CATH REMOVAL Left 07/22/2015   Procedure: REMOVAL PORT-A-CATH;  Surgeon: Stark Klein, MD;  Location: [facility];  Service: General;  Laterality: Left;  . PORTACATH PLACEMENT N/A 07/02/2014   Procedure: INSERTION PORT-A-CATH;   Surgeon: Stark Klein, MD;  Location: [facility];  Service: General;  Laterality: N/A;  . TUBAL LIGATION    . UTERINE FIBROID SURGERY  2005   ablation    Current Outpatient Rx  . Order #: [id number] Class: Historical Med  . Order #: [id number] Class: Print  . Order #: [id number] Class: Historical Med  . Order #: [id number] Class: Historical Med  . Order #: [id number] Class: Print  . Order #: [id number] Class: Print  . Order #: [id number] Class: Normal  . Order #: [id number] Class: Print  . Order #: [id number] Class: Historical Med  . Order #: [id number] Class: Historical Med  . Order #: [id number] Class: Print  . Order #: [id number] Class: Print  . Order #: [id number] Class: Normal  . Order #: [id number] Class: Print  . Order #: [id number] Class: Print  . Order #: [id number] Class: Historical Med  . Order #: [id number] Class: Print  . Order #: [id number] Class: Historical Med  . Order #: [id number] Class: Print  . Order #: [id number] Class: Normal  . Order #: [id number] Class: Print  . Order #: [id number] Class: No Print  . Order #: [id number] Class: Print  . Order #: [id number] Class: Print    Allergies Hydrocodone  Family History  Problem Relation Age of Onset  . Breast cancer Mother 48    currently 4; TAH/BSO d/t ?cervical ca at 25  . Lung cancer Father     deceased 70  . Breast cancer Maternal Aunt     dx 84s; currently in her late 77s  . Diabetes Neg Hx     Social History Social History  Substance Use Topics  . Smoking status: Former Smoker    Packs/day: 0.25    Years: 20.00    Types: Cigarettes  . Smokeless tobacco: Never Used  . Alcohol use No     Comment: Percoset,Vicodin, Oxycotin    Review of Systems  Constitutional: No fever/chills. Positive total body pain.  Eyes: No visual changes. ENT: No sore throat. Cardiovascular: Denies chest pain. Respiratory: Denies shortness of breath. Gastrointestinal: No abdominal pain.  No nausea, no vomiting.  No diarrhea.  No  constipation. Genitourinary: Negative for dysuria. Musculoskeletal: Negative for back pain. Skin: Negative for rash. Neurological: Negative for headaches, focal weakness or numbness.  10-point ROS otherwise negative.  ____________________________________________   PHYSICAL EXAM:  VITAL SIGNS: ED Triage Vitals  Enc Vitals Group     BP 09/26/16 1124 123/91     Pulse Rate 09/26/16 1124 95     Resp 09/26/16 1124 18     Temp 09/26/16 1124 98.7 F (37.1 C)     Temp Source 09/26/16 1124 Oral     SpO2 09/26/16 1124 94 %     Weight 09/26/16 1129 151 lb (68.5 kg)     Height 09/26/16 1129 '5\' 10"'$  (1.778 m)    Constitutional: Alert and oriented. Well appearing and in no acute distress. Eyes: Conjunctivae are normal. Head: Atraumatic. Nose: No congestion/rhinnorhea. Mouth/Throat: Mucous membranes are moist.  Oropharynx non-erythematous. Neck: No stridor.  Cardiovascular: Normal rate, regular rhythm. Good peripheral circulation. Grossly normal heart sounds.   Respiratory: Normal respiratory effort.  No retractions. Lungs CTAB. Gastrointestinal: Soft and nontender. No distention.  Musculoskeletal: No lower extremity tenderness nor edema. No gross deformities of extremities. Neurologic:  Normal speech and language. No gross focal neurologic deficits are appreciated.  Skin:  Skin is warm, dry and intact. No rash noted.  ____________________________________________   LABS (all labs ordered are listed, but only abnormal results are displayed)  Labs Reviewed  I-STAT CHEM 8, ED - Abnormal; Notable for the following:       Result Value   Potassium 3.3 (*)    Glucose, Bld 266 (*)    Calcium, Ion 1.07 (*)    All other components within normal limits   ____________________________________________   PROCEDURES  Procedure(s) performed:   Procedures  None ____________________________________________   INITIAL IMPRESSION / ASSESSMENT AND PLAN / ED COURSE  Pertinent labs &  imaging results that were available during my care of the patient were reviewed by me and considered in my medical decision making (see chart for details).  Patient presents to the emergency department for evaluation of diffuse pain in the setting of metastatic cancer. She has been not taking her methadone for the past 2 days because she reports dropping in the toilet accidentally. She saw her oncologist on 11/22 and was given a refill of pain medication at that time. She is currently under the care of hospice. Plan for pain control the emergency department along with baseline i-STAT labs. Will discuss with patient's oncologist to determine med refill and amount.   02:49 PM Patient's lab work is largely unremarkable. Pain improved with Dilaudid. Discussed the case with the patient's oncologist. She will need to follow with her hospice providers for refills of additional pain medication. I plan to refill her Methadone and Percocet for her acute on chronic cancer-related pain. She will call for Rx refill through hospice ASAP.  ____________________________________________  FINAL CLINICAL IMPRESSION(S) / ED DIAGNOSES  Final diagnoses:  Cancer associated pain     MEDICATIONS GIVEN DURING THIS VISIT:  Medications  HYDROmorphone (DILAUDID) injection 1 mg (1 mg Intravenous Given 09/26/16 1316)  ondansetron (ZOFRAN) injection 4 mg (4 mg Intravenous Given 09/26/16 1316)     NEW OUTPATIENT MEDICATIONS STARTED DURING THIS VISIT:  Discharge Medication List as of 09/26/2016  2:48 PM    START taking these medications   Details  !! methadone (DOLOPHINE) 5 MG tablet Take 1 tablet (5 mg total) by mouth every 8 (eight) hours., Starting Mon 09/26/2016, Until Mon 10/03/2016, Print    oxyCODONE-acetaminophen (PERCOCET/ROXICET) 5-325 MG tablet Take 2 tablets by mouth every 4 (four) hours as needed for severe pain., Starting Mon 09/26/2016, Print     !! - Potential duplicate medications found. Please  discuss with provider.       Note:  This document was prepared using Dragon voice recognition software and may include unintentional dictation errors.  Nanda Quinton, MD Emergency Medicine   Margette Fast, MD 09/27/16 0930

## 2016-10-11 ENCOUNTER — Telehealth: Payer: Self-pay

## 2016-10-11 NOTE — Telephone Encounter (Signed)
Pt is requesting methadone 5 mg refill and oxycodone 10 mg refill. Please call her when rx ready for pickup.

## 2016-10-14 NOTE — Telephone Encounter (Deleted)
No entry 

## 2016-10-14 NOTE — Telephone Encounter (Signed)
Error

## 2016-10-14 NOTE — Telephone Encounter (Signed)
error 

## 2016-10-20 ENCOUNTER — Telehealth: Payer: Self-pay | Admitting: *Deleted

## 2016-10-20 NOTE — Telephone Encounter (Signed)
Pt requests refills on Oxycodone 10 mg and Methadone 5 mg.   Forwarded her call to [facility] Nurse line.

## 2016-10-20 NOTE — Telephone Encounter (Signed)
This RN contacted [facility] per call from pt requesting refills on pain medication.  Note Pt has deferred her care per [facility] to Dr Noah Delaine- who is filling her pain medications. Pt was referred to [facility] by Dr [name] but then patient transferred her care to Dr Noah Delaine.  This RN has spoken to [facility] last week per pt's call at that time as well this RN spoke with pt regarding refills and who she would like to be attending.  Per call last week pt stated " I want my care at the [facility] " -   Per [facility] - pt needs to inform attending nurse so signature can be obtained for transfer of care.   This RN per call last week requested hospice to follow up per above for appropriate delagation of care and pt teaching as to whom she needs to contact for care.  This RN today spoke with Triage nurse per call again from pt - requested follow up and communication to patient per above.

## 2016-10-20 NOTE — Telephone Encounter (Signed)
Patient called stating that she needs a refill protonix, xanax, and hydrocodone 10 mg.

## 2016-10-28 ENCOUNTER — Ambulatory Visit (HOSPITAL_BASED_OUTPATIENT_CLINIC_OR_DEPARTMENT_OTHER): Payer: Medicaid Other | Admitting: Oncology

## 2016-10-28 ENCOUNTER — Telehealth: Payer: Self-pay | Admitting: Oncology

## 2016-10-28 VITALS — BP 153/85 | HR 123 | Temp 97.6°F | Resp 20 | Ht 70.0 in | Wt 149.0 lb

## 2016-10-28 DIAGNOSIS — C7951 Secondary malignant neoplasm of bone: Secondary | ICD-10-CM

## 2016-10-28 DIAGNOSIS — C7931 Secondary malignant neoplasm of brain: Secondary | ICD-10-CM

## 2016-10-28 DIAGNOSIS — G893 Neoplasm related pain (acute) (chronic): Secondary | ICD-10-CM

## 2016-10-28 DIAGNOSIS — C50919 Malignant neoplasm of unspecified site of unspecified female breast: Secondary | ICD-10-CM

## 2016-10-28 DIAGNOSIS — C7802 Secondary malignant neoplasm of left lung: Secondary | ICD-10-CM

## 2016-10-28 DIAGNOSIS — C50412 Malignant neoplasm of upper-outer quadrant of left female breast: Secondary | ICD-10-CM | POA: Diagnosis not present

## 2016-10-28 NOTE — Telephone Encounter (Signed)
Patient bypassed scheduling/check out area.  Called patient and confirmed follow up appointment with Dr [name]  for 11/24/16, per 10/28/16 los.

## 2016-10-28 NOTE — Progress Notes (Signed)
[place]  [phone] [phone]     ID: [name] DOB: 27-Aug-1966  MR#: [id number]  WYO#:[id number]  Patient Care Team: [name], MD as PCP - General (Family Medicine) Stark Klein, MD as Consulting Physician (General Surgery) Chauncey Cruel, MD as Consulting Physician (Oncology) Thea Silversmith, MD as Consulting Physician (Radiation Oncology) [facility] Bunnie Pion, NP as Nurse Practitioner (Nurse Practitioner) Kyung Rudd, MD as Consulting Physician (Radiation Oncology)  CHIEF COMPLAINT: Stage III left breast cancer  CURRENT TREATMENT: [facility]; out of facility DNR in place  INTERVAL HISTORY: Shamieka returns today for follow-up of her stage IV breast cancer. She continues under the care of hospice of [place]. At the last visit we found out that when she was discharged from the hospital they named Dr. Noah Delaine as her hospice designated physician. I discussed this with the hospice medical director and explained to Kaegan that if she wanted to assign  me as her hospice physician I would be glad to write her pain nausea and other medicines but right now she had to work through the doctor that she is assigned within that is Dr. Noah Delaine. She seemed to understand that and I thought the problem would have been taking care of by now.  In fact Zionah tells me that the hospice nurses come by various times "with papers for me to sign". Tye [place] has not signed them because she somehow suspects this may have something to do with taking her money or taking her house. She is not really clear what it is all about. The result is that we are on the same situation as we were before.  REVIEW OF SYSTEMS:  Mayrani continues on oxygen 24 7. She says she has no pain medicine and no nausea medicine. She told me her blood sugar was almost 400 yesterday. I asked her if she was still on steroids and she did not know. I suggest that she really needs to be off steroids  because this is throwing her sugar off but she tells me she has no medicine of any kind and that she needs all her medicines refilled today. Unfortunately no family member came with her today. We will attempt to contact the hospice nurse today to see if we can update this.   BREAST CANCER HISTORY: From the original intake note:  The patient herself palpated a mass in her left breast and brought it to the attention of [facility] NP 04/22/2014. She palpated a large nontender mass at the 4:00 position as well as a small tender mass in the 6:00 position of the left breast. The patient was set up for imaging at the breast Center where on 05/05/2014 she underwent bilateral diagnostic mammography and left breast ultrasonography. The mammogram showed an irregular mass in the outer left breast with no other suspicious findings. This was firm and fixed at the 3:00 position approximately 10 cm from the nipple. Ultrasound showed a hypoechoic irregular solid mass in this area, measuring 1.8 cm. Ultrasound of the left axilla showed 2 suspicious level I lymph nodes, the largest measuring 1 cm, with a thickened pole. A smaller lymph node measured 0.6 cm but had no visible fatty hilum.  On 05/09/2014 the patient underwent biopsy of the left breast mass (she refused biopsy of the left axilla) which showed ([id number]) and invasive ductal carcinoma, grade 2, estrogen receptor positive at 100%, with strong staining intensity, progesterone receptor 80% positive, with weak staining intensity, with an MIB-1  of 80%, and no HER-2 amplification.  On 05/16/2014 the patient underwent bilateral breast MRI. This showed a 1.7 cm irregular enhancing mass at the 3:00 position of the left breast associated with the biopsy cleft. There were 3 level I left axillary lymph nodes with thickened cortices. The largest measured 1.3 cm. There were no other findings of concern.  Her treatment for stage III disease is detailed below.  METASTATIC  DISEASE:  Damonique presented to the emergency room 06/06/2016 complaining of shortness of breath and pain after an episode of syncope. CT angiogram of the chest raises suspicion for possible edema, infection, or carcinomatosis. There were also numerous lytic lung lesions. Right pleural fluid cytology collected 06/08/2016 showed ([id number]) adenocarcinoma, estrogen receptor 90% positive, progesterone receptor negative, HER-2 not amplified with a signals ratio of 1.09 and number per cell 1.85. Brain MRI was obtained 06/09/2016 and showed between 15 and 20 enhancing lesions consistent with metastatic disease, chiefly involving the cerebellum.  Her subsequent history is detailed below   PAST MEDICAL HISTORY: Past Medical History:  Diagnosis Date  . Anemia    low iron  . Arthritis   . Depression    pt denies   . Family history of malignant neoplasm of breast   . Head ache   . Hot flashes   . Lymphedema of breast    left  . Malignant neoplasm of breast (female), unspecified site    left breast  . Mental disorder    depression-  . Newly diagnosed diabetes 11/12/2014   chemo increasing blood glucose; not on meds now  . Radiation 04/22/15-06/23/15   Left Breast    PAST SURGICAL HISTORY: Past Surgical History:  Procedure Laterality Date  . BREAST LUMPECTOMY WITH SENTINEL LYMPH NODE BIOPSY Left 06/11/2014   Procedure: BREAST LUMPECTOMY WITH SENTINEL LYMPH NODE BX, POSSIBLE AXILLARY LYMPH NODE DISSECTION;  Surgeon: Stark Klein, MD;  Location: [place];  Service: General;  Laterality: Left;  . EVACUATION BREAST HEMATOMA Left 07/10/2014   Procedure: EVACUATION OF LEFT BREAST HEMATOMA ;  Surgeon: Stark Klein, MD;  Location: [place];  Service: General;  Laterality: Left;  . FOOT SURGERY  2011   rt  . INCISION AND DRAINAGE Left 07/02/2014   Procedure: Drainage left breast seroma;  Surgeon: Stark Klein, MD;  Location: [facility];  Service: General;  Laterality: Left;  .  INCISION AND DRAINAGE ABSCESS N/A 11/12/2014   Procedure: ASPIRATION AND INCISION AND DRAINAGE LEFT BREAST ABSCESS;  Surgeon: Michael Boston, MD;  Location: [facility];  Service: General;  Laterality: N/A;  . [place]   right  . PORT-A-CATH REMOVAL Left 07/22/2015   Procedure: REMOVAL PORT-A-CATH;  Surgeon: Stark Klein, MD;  Location: [facility];  Service: General;  Laterality: Left;  . PORTACATH PLACEMENT N/A 07/02/2014   Procedure: INSERTION PORT-A-CATH;  Surgeon: Stark Klein, MD;  Location: [facility];  Service: General;  Laterality: N/A;  . TUBAL LIGATION    . UTERINE FIBROID SURGERY  2005   ablation    FAMILY HISTORY Family History  Problem Relation Age of Onset  . Breast cancer Mother 62    currently 65; TAH/BSO d/t ?cervical ca at 53  . Lung cancer Father     deceased 55  . Breast cancer Maternal Aunt     dx 65s; currently in her late 31s  . Diabetes Neg Hx    the patient's father died at the age of 2 from lung  cancer. The patient's mother was diagnosed with breast cancer at the age of 65. She survives. The patient had 2 brothers, and 2 sisters. One brother may have a diagnosis of "stomach cancer"  GYNECOLOGIC HISTORY:  No LMP recorded. Patient has had an ablation. Menarche age 43 which is also when the patient first had a child. She is GX P3. She stopped having periods after laser ablation in 2005.  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 She has lived with her daughter [name] and her 4 children who are 75, 8, 5, and 4, and [name]'s significant other. 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 Pakou's son Gilford Raid works as a Dietitian in [place]. The patient's daughter [name] works as a Scientist, water quality, also in [place]. The patient has 7 grandchildren. She is not a church attender    ADVANCED DIRECTIVES: she has named her daughter Jadene Pierini. [place] as her healthcare  power of attorney; she is under the care of hospice and has an out of facility DO NOT RESUSCITATE order in place    HEALTH MAINTENANCE: Social History  Substance Use Topics  . Smoking status: Former Smoker    Packs/day: 0.25    Years: 20.00    Types: Cigarettes  . Smokeless tobacco: Never Used  . Alcohol use No     Comment: Percoset,Vicodin, Oxycotin      Allergies  Allergen Reactions  . Hydrocodone Itching and Nausea Only    Current Outpatient Prescriptions  Medication Sig Dispense Refill  . ALPRAZolam (XANAX) 1 MG tablet Take 1 mg by mouth 3 (three) times daily as needed for sleep or anxiety.  5  . benzonatate (TESSALON) 100 MG capsule Take 1 capsule (100 mg total) by mouth 3 (three) times daily as needed for cough. 20 capsule 0  . chlorpheniramine-HYDROcodone (TUSSIONEX) 10-8 MG/5ML SUER Take 5 mLs by mouth 2 (two) times daily as needed for cough.  0  . diphenhydrAMINE (BENADRYL) 25 mg capsule Take 1 capsule (25 mg total) by mouth every 6 (six) hours as needed for itching (Hold for sedation). 30 capsule 0  . docusate sodium (COLACE) 100 MG capsule Take 1 capsule (100 mg total) by mouth daily. 10 capsule 0  . feeding supplement, GLUCERNA SHAKE, (GLUCERNA SHAKE) LIQD Take 237 mLs by mouth 3 (three) times daily between meals. 90 Can 0  . fluconazole (DIFLUCAN) 100 MG tablet Take 1 tablet (100 mg total) by mouth daily. (Patient not taking: Reported on 09/26/2016) 30 tablet 3  . glipiZIDE (GLUCOTROL XL) 5 MG 24 hr tablet Take 2 tablets (10 mg total) by mouth daily with breakfast. 120 tablet 12  . ipratropium-albuterol (DUONEB) 0.5-2.5 (3) MG/3ML SOLN Inhale 3 mLs into the lungs 3 (three) times daily as needed for shortness of breath or wheezing.  0  . letrozole (FEMARA) 2.5 MG tablet Take 2.5 mg by mouth daily.  0  . LORazepam (ATIVAN) 0.5 MG tablet Take 1 tablet (0.5 mg total) by mouth every 4 (four) hours as needed for anxiety. 30 tablet 0  . methadone (DOLOPHINE) 5 MG tablet Take 1  tablet (5 mg total) by mouth every 8 (eight) hours. (Patient taking differently: Take 5 mg by mouth every 8 (eight) hours as needed for severe pain. ) 90 tablet 0  . mirtazapine (REMERON) 30 MG tablet Take 1 tablet (30 mg total) by mouth at bedtime. 30 tablet 3  . naproxen (NAPROSYN) 500 MG tablet Take  1 tablet (500 mg total) by mouth 2 (two) times daily with a meal. (Patient not taking: Reported on 09/26/2016)    . ondansetron (ZOFRAN ODT) 4 MG disintegrating tablet Take 1 tablet (4 mg total) by mouth every 8 (eight) hours as needed for nausea or vomiting. 30 tablet 0  . pantoprazole (PROTONIX) 20 MG tablet Take 2 tablets (40 mg total) by mouth daily. (Patient not taking: Reported on 09/26/2016) 60 tablet 0  . PROAIR HFA 108 (90 Base) MCG/ACT inhaler Inhale 2 puffs into the lungs every 6 (six) hours as needed for shortness of breath or wheezing.  0  . prochlorperazine (COMPAZINE) 10 MG tablet Take 1 tablet (10 mg total) by mouth every 6 (six) hours as needed for nausea or vomiting. 30 tablet 0   No current facility-administered medications for this visit.     OBJECTIVE: Middle-aged [place] American woman Wearing oxygen by nasal cannula Vitals:   10/28/16 1316  BP: (!) 153/85  Pulse: (!) 123  Resp: 20  Temp: 97.6 F (36.4 C)     Body mass index is 21.38 kg/m.    ECOG FS:2 - Symptomatic, <50% confined to bed  Sclerae unicteric, EOMs intact Oropharynx clear and moist No cervical or supraclavicular adenopathy Lungs no rales or rhonchi Heart regular rate and rhythm Abd soft, nontender, positive bowel sounds MSK no focal spinal tenderness, no upper extremity lymphedema Neuro: nonfocal, well oriented, appropriate affect Breasts: The right breast is unremarkable. The left breast is dusky and firmer than the right breast with some dimpling superiorly. Both axillae are benign.    LAB RESULTS:  CMP     Component Value Date/Time   NA 138 09/26/2016 1332   NA 134 (L) 08/12/2016 1231   K  3.3 (L) 09/26/2016 1332   K 3.5 08/12/2016 1231   CL 102 09/26/2016 1332   CO2 20 (L) 08/12/2016 1231   GLUCOSE 266 (H) 09/26/2016 1332   GLUCOSE 409 (H) 08/12/2016 1231   BUN 18 09/26/2016 1332   BUN 17.7 08/12/2016 1231   CREATININE 0.90 09/26/2016 1332   CREATININE 0.8 08/12/2016 1231   CALCIUM 9.2 08/12/2016 1231   PROT 6.6 08/12/2016 1231   ALBUMIN 3.1 (L) 08/12/2016 1231   AST 14 08/12/2016 1231   ALT 35 08/12/2016 1231   ALKPHOS 104 08/12/2016 1231   BILITOT 0.25 08/12/2016 1231   GFRNONAA >60 08/02/2016 0413   GFRAA >60 08/02/2016 0413    I No results found for: SPEP  Lab Results  Component Value Date   WBC 6.1 08/12/2016   NEUTROABS 5.3 08/12/2016   HGB 12.2 09/26/2016   HCT 36.0 09/26/2016   MCV 89.2 08/12/2016   PLT 155 08/12/2016      Chemistry      Component Value Date/Time   NA 138 09/26/2016 1332   NA 134 (L) 08/12/2016 1231   K 3.3 (L) 09/26/2016 1332   K 3.5 08/12/2016 1231   CL 102 09/26/2016 1332   CO2 20 (L) 08/12/2016 1231   BUN 18 09/26/2016 1332   BUN 17.7 08/12/2016 1231   CREATININE 0.90 09/26/2016 1332   CREATININE 0.8 08/12/2016 1231      Component Value Date/Time   CALCIUM 9.2 08/12/2016 1231   ALKPHOS 104 08/12/2016 1231   AST 14 08/12/2016 1231   ALT 35 08/12/2016 1231   BILITOT 0.25 08/12/2016 1231       Lab Results  Component Value Date   LABCA2 1,356.9 (H) 06/08/2016    No  components found for: QASTM196  No results for input(s): INR in the last 168 hours.  Urinalysis    Component Value Date/Time   COLORURINE YELLOW 07/09/2016 0057   APPEARANCEUR CLEAR 07/09/2016 0057   LABSPEC 1.026 07/09/2016 0057   PHURINE 6.5 07/09/2016 0057   GLUCOSEU NEGATIVE 07/09/2016 0057   HGBUR NEGATIVE 07/09/2016 0057   BILIRUBINUR NEGATIVE 07/09/2016 0057   KETONESUR NEGATIVE 07/09/2016 0057   PROTEINUR NEGATIVE 07/09/2016 0057   UROBILINOGEN 0.2 06/03/2014 1050   NITRITE NEGATIVE 07/09/2016 0057   LEUKOCYTESUR NEGATIVE  07/09/2016 0057    STUDIES: No results found.  ASSESSMENT: 50 y.o. [place] woman status post left breast upper outer quadrant biopsy 05/09/2014 for a clinicalT1c N1, stage IIA invasive ductal carcinoma, grade 2, estrogen and progesterone receptor positive, HER-2 nonamplified, with an MIB-1 of 80%  (1) patient met with genetics counselor 05/29/2014 but decided against genetics testing   (2) tobacco abuse. The patient has been strongly urged to discontinue smoking. She was prescribed a nicotine patch 09/02/2014  (3) status post left lumpectomy and axillary lymph node dissection 06/11/2014 for a pT1c pN3, stage IIIC invasive ductal carcinoma, grade 3, with repeat HER-2 negative  (4) adjuvant chemotherapy consisting of doxorubicin and cyclophosphamide in dose dense fashion x4, completed 09/09/2014, followed by paclitaxel weekly x12 completed 03/09/2015  (5) adjuvant radiation 04/22/15-06/23/15 Left breast/ 45 Gy at 1.8 Gy per fraction x 25 fractions.  Left supraclavicular fossa and axilla/ 45 Gy at 1.8 Gy per fraction x 25 fractions Left breast boost/ 16 Gy at 2 Gy per fraction x 8 fractions  (6) tamoxifen started 07/01/2015, discontinued 06/09/2016 with evidence of disease progression  METASTATIC DISEASE: involving brain, lungs and bone documented August 2017 (7) CT angio 06/06/2016 c/w lymphangitic carcinomatosis and lytic bone lesions                 (a) right thoracentesis 06/08/2016-- cytology confirms adenocarcinoma, estrogen receptor positive                 (b) brain MRI 06/09/2016 shows multiple metastases   (c) CA 27-29 is informative  (8) started letrozole 06/09/2016; palbociclib added 06/24/2016  (9) radiation to CNS and spine in 10 fractions 06/13/2016 through 06/27/2016  (10) poorly controlled pain:   (a) initially treated with oxycontin BID with OxyIR PRN, discontinued 07/25/2016 with the patient unable to keep her pain management agreement (multiple episodes of  loss medications, need for early refills, not calling with medication changes, etc.)  (b) started on methadone 10 mg by mouth 3 times a day as of 07/25/2016--not current  (11) denosumab/Xgeva started 07/15/2016, discontinued as patient opted for comfort care  (12) as of 08/02/2016 patient opted for comfort/palliative care under the care of hospice.   PLAN: Destanie was supposed to have changed the hospice attending of record after her last visit here. Apparently the hospice nurse has been trying to facilitate this. Aviella has been suspicious that all this paper signing as she put it is for some alterntative motive so she has not signed. The upshot ist is that Dr. Noah Delaine remains her attending of record as far as hospice is concerned.  I wrote out for Jeanni what this means. She will need to go through hospice and they will contact Dr. Noah Delaine to get the medications she needs. Until she is signs the papers saying otherwise we are not going to be able to get her the pain medicine she is demanding.  I gave her a written statement from  me that states as soon as she names me as the attending of record I would start her on methadone 5 mg 3 times a day, with oxycodone 5 mg as needed for pain. The oxycodone can be substituted for any similar medication that hospice has on their formulary, at the discretion of the hospice director.  Hind was very upset. On the other hand I really don't see any way to move forward here unless she changes attending's. At this point she needs to see Dr. Noah Delaine for her medication needs.  I made her a return appointment here in 4 weeks. I will be glad to see her before then of course if it becomes necessary.  Chauncey Cruel, MD 10/28/16

## 2016-10-30 ENCOUNTER — Emergency Department (HOSPITAL_COMMUNITY): Payer: Medicaid Other

## 2016-10-30 ENCOUNTER — Encounter (HOSPITAL_COMMUNITY): Payer: Self-pay | Admitting: Emergency Medicine

## 2016-10-30 ENCOUNTER — Emergency Department (HOSPITAL_COMMUNITY)
Admission: EM | Admit: 2016-10-30 | Discharge: 2016-10-30 | Disposition: A | Discharge status: Home / Self Care | Payer: Medicaid Other | Attending: Emergency Medicine | Admitting: Emergency Medicine

## 2016-10-30 ENCOUNTER — Other Ambulatory Visit: Payer: Self-pay | Admitting: Oncology

## 2016-10-30 DIAGNOSIS — R109 Unspecified abdominal pain: Secondary | ICD-10-CM

## 2016-10-30 DIAGNOSIS — E119 Type 2 diabetes mellitus without complications: Secondary | ICD-10-CM | POA: Diagnosis not present

## 2016-10-30 DIAGNOSIS — C7931 Secondary malignant neoplasm of brain: Secondary | ICD-10-CM | POA: Diagnosis not present

## 2016-10-30 DIAGNOSIS — R1084 Generalized abdominal pain: Secondary | ICD-10-CM | POA: Diagnosis not present

## 2016-10-30 DIAGNOSIS — Z7984 Long term (current) use of oral hypoglycemic drugs: Secondary | ICD-10-CM | POA: Diagnosis not present

## 2016-10-30 DIAGNOSIS — C7951 Secondary malignant neoplasm of bone: Secondary | ICD-10-CM | POA: Insufficient documentation

## 2016-10-30 DIAGNOSIS — C7800 Secondary malignant neoplasm of unspecified lung: Secondary | ICD-10-CM | POA: Insufficient documentation

## 2016-10-30 DIAGNOSIS — C7981 Secondary malignant neoplasm of breast: Secondary | ICD-10-CM | POA: Insufficient documentation

## 2016-10-30 DIAGNOSIS — Z87891 Personal history of nicotine dependence: Secondary | ICD-10-CM | POA: Insufficient documentation

## 2016-10-30 DIAGNOSIS — C50919 Malignant neoplasm of unspecified site of unspecified female breast: Secondary | ICD-10-CM

## 2016-10-30 DIAGNOSIS — Z79899 Other long term (current) drug therapy: Secondary | ICD-10-CM | POA: Insufficient documentation

## 2016-10-30 LAB — URINALYSIS, ROUTINE W REFLEX MICROSCOPIC
BACTERIA UA: NONE SEEN
BILIRUBIN URINE: NEGATIVE
GLUCOSE, UA: 50 mg/dL — AB
HGB URINE DIPSTICK: NEGATIVE
Ketones, ur: NEGATIVE mg/dL
NITRITE: NEGATIVE
PROTEIN: NEGATIVE mg/dL
pH: 7 (ref 5.0–8.0)

## 2016-10-30 LAB — COMPREHENSIVE METABOLIC PANEL
ALBUMIN: 3.7 g/dL (ref 3.5–5.0)
ALT: 45 U/L (ref 14–54)
AST: 55 U/L — AB (ref 15–41)
Alkaline Phosphatase: 223 U/L — ABNORMAL HIGH (ref 38–126)
Anion gap: 13 (ref 5–15)
BUN: 30 mg/dL — AB (ref 6–20)
CHLORIDE: 97 mmol/L — AB (ref 101–111)
CO2: 26 mmol/L (ref 22–32)
CREATININE: 0.71 mg/dL (ref 0.44–1.00)
Calcium: 9.1 mg/dL (ref 8.9–10.3)
GFR calc Af Amer: >60 mL/min (ref >60)
GLUCOSE: 198 mg/dL — AB (ref 65–99)
POTASSIUM: 4.5 mmol/L (ref 3.5–5.1)
SODIUM: 136 mmol/L (ref 135–145)
Total Bilirubin: 0.5 mg/dL (ref 0.3–1.2)
Total Protein: 7 g/dL (ref 6.5–8.1)

## 2016-10-30 LAB — CBC WITH DIFFERENTIAL/PLATELET
BASOS ABS: 0.1 10*3/uL (ref 0.0–0.1)
Basophils Relative: 0 %
Eosinophils Absolute: 0 10*3/uL (ref 0.0–0.7)
Eosinophils Relative: 0 %
HEMATOCRIT: 36.3 % (ref 36.0–46.0)
Hemoglobin: 11.9 g/dL — ABNORMAL LOW (ref 12.0–15.0)
LYMPHS ABS: 0.7 10*3/uL (ref 0.7–4.0)
LYMPHS PCT: 5 %
MCH: 29.9 pg (ref 26.0–34.0)
MCHC: 32.8 g/dL (ref 30.0–36.0)
MCV: 91.2 fL (ref 78.0–100.0)
Monocytes Absolute: 0.6 10*3/uL (ref 0.1–1.0)
Monocytes Relative: 5 %
NEUTROS ABS: 11 10*3/uL — AB (ref 1.7–7.7)
Neutrophils Relative %: 90 %
Platelets: 149 10*3/uL — ABNORMAL LOW (ref 150–400)
RBC: 3.98 MIL/uL (ref 3.87–5.11)
RDW: 16.5 % — ABNORMAL HIGH (ref 11.5–15.5)
WBC: 12.3 10*3/uL — AB (ref 4.0–10.5)

## 2016-10-30 LAB — I-STAT CG4 LACTIC ACID, ED
LACTIC ACID, VENOUS: 3.64 mmol/L — AB (ref 0.5–1.9)
Lactic Acid, Venous: 2.51 mmol/L (ref 0.5–1.9)

## 2016-10-30 LAB — I-STAT TROPONIN, ED
TROPONIN I, POC: 0.16 ng/mL — AB (ref 0.00–0.08)
Troponin i, poc: 0.21 ng/mL (ref 0.00–0.08)

## 2016-10-30 LAB — TYPE AND SCREEN
ABO/RH(D): B POS
Antibody Screen: NEGATIVE

## 2016-10-30 LAB — ABO/RH: ABO/RH(D): B POS

## 2016-10-30 LAB — LIPASE, BLOOD: Lipase: 26 U/L (ref 11–51)

## 2016-10-30 MED ORDER — IOPAMIDOL (ISOVUE-300) INJECTION 61%
INTRAVENOUS | Status: AC
  Administered 2016-10-30: 100 mL via INTRAVENOUS
  Filled 2016-10-30: qty 100

## 2016-10-30 MED ORDER — HYDROMORPHONE HCL 2 MG/ML IJ SOLN
2.0000 mg | Freq: Once | INTRAMUSCULAR | Status: AC
  Administered 2016-10-30: 2 mg via INTRAVENOUS
  Filled 2016-10-30: qty 1

## 2016-10-30 MED ORDER — HYDROMORPHONE HCL 1 MG/ML IJ SOLN
1.0000 mg | Freq: Once | INTRAMUSCULAR | Status: AC
  Administered 2016-10-30: 1 mg via INTRAVENOUS
  Filled 2016-10-30: qty 1

## 2016-10-30 MED ORDER — ONDANSETRON 4 MG PO TBDP: 4.0000 mg | ORAL_TABLET | Freq: Three times a day (TID) | ORAL | 0 refills | Status: AC | PRN

## 2016-10-30 MED ORDER — ONDANSETRON HCL 4 MG/2ML IJ SOLN
4.0000 mg | Freq: Once | INTRAMUSCULAR | Status: AC | PRN
  Administered 2016-10-30: 4 mg via INTRAVENOUS
  Filled 2016-10-30: qty 2

## 2016-10-30 MED ORDER — HYDROMORPHONE HCL 2 MG PO TABS: 1.0000 mg | ORAL_TABLET | ORAL | 0 refills | Status: AC | PRN

## 2016-10-30 MED ORDER — SODIUM CHLORIDE 0.9 % IV BOLUS (SEPSIS)
1000.0000 mL | Freq: Once | INTRAVENOUS | Status: AC
  Administered 2016-10-30: 1000 mL via INTRAVENOUS

## 2016-10-30 MED ORDER — ONDANSETRON HCL 4 MG/2ML IJ SOLN: 4.0000 mg | Freq: Once | INTRAMUSCULAR | Status: DC

## 2016-10-30 NOTE — Discharge Instructions (Signed)
As we discussed, some of your labs were concerning and they may worsen.  There is a new tumor in your liver which was discovered today on CT as well. I recommend to take the pain medication as directed, take zofran for nausea. Make sure hospice comes to set up all your medications for you. Follow-up with your primary care doctor. Return here for new/worsening symptoms--- unable to tolerate oral medications, severe nausea, high fever, etc.

## 2016-10-30 NOTE — ED Triage Notes (Addendum)
Pt c/o diffuse generalized abdominal pain, abdominal swelling, nausea, and emesis with streaks of red blood x 5 days. No diarrhea or blood in stool. Hx breast cancer. Chemo pill only. No IV chemo or radiation currently.  Pt adds chest pain and tightness x 5 days.

## 2016-10-30 NOTE — ED Provider Notes (Signed)
[facility] DEPT Provider Note   CSN: [id number] Arrival date & time: 10/30/16  1115     History   Chief Complaint Chief Complaint  Patient presents with  . Abdominal Pain    HPI ARIYEL JEANGILLES is a 50 y.o. female.  The history is provided by the patient and medical records.  Abdominal Pain   Associated symptoms include nausea and vomiting.    50 year old female with history of anemia, depression, diabetes, hx of left sided metastatic breast cancer with diffuse metastases to lung, brain, bone, lymphatic system, currently on hospice, presenting to the ED for abdominal pain. She reports this been ongoing for about 5 days now. She reports generalized abdominal pain, swelling, nausea, and vomiting. States she has had some streaks of bright red blood in her emesis recently. She denies any diarrhea or blood in the stool. She denies any fever. No urinary symptoms or pelvic pain. Patient is followed by oncology, Dr. Curlene Labrum.  States she only takes oral chemotherapy at this time.  States she has been told she is "terminal and is just waiting to die".  States she usually takes oxycodone for her pain, however has run out.  Prior abdominal surgeries include tubal ligation and fibroid removal surgery.  No fever noted.   Past Medical History:  Diagnosis Date  . Anemia    low iron  . Arthritis   . Depression    pt denies   . Family history of malignant neoplasm of breast   . Head ache   . Hot flashes   . Lymphedema of breast    left  . Malignant neoplasm of breast (female), unspecified site    left breast  . Mental disorder    depression-  . Newly diagnosed diabetes 11/12/2014   chemo increasing blood glucose; not on meds now  . Radiation 04/22/15-06/23/15   Left Breast    Patient Active Problem List   Diagnosis Date Noted  . Advance care planning   . Goals of care, counseling/discussion   . Neoplasm related pain   . Palliative care by specialist   . Respiratory failure with  hypoxia ([place]) 07/31/2016  . Acute on chronic respiratory failure with hypoxia ([place]) 07/31/2016  . Metastatic breast cancer ([place]) 07/31/2016  . Cancer, metastatic to bone ([place]) 06/10/2016  . Brain metastasis ([place])   . Protein-calorie malnutrition, severe 06/09/2016  . Pleural effusion   . S/P thoracentesis   . Acute respiratory failure with hypoxia ([place]) 06/07/2016  . Back pain 06/07/2016  . Protein-calorie malnutrition, moderate ([place]) 06/07/2016  . Cough   . Hypoxia   . Lung metastases ([place])   . Shortness of breath   . Bronchitis 05/25/2016  . Chest pain 02/02/2015  . Depression 01/26/2015  . Abscess of left breast 11/12/2014  . Diabetes mellitus without complication ([place]) 38/25/0539  . Neutropenia ([place]) 08/05/2014  . Lymphedema of breast 08/05/2014  . Breast cancer metastasized to axillary lymph node ([place]) 06/11/2014  . Family history of malignant neoplasm of breast   . Breast cancer of upper-outer quadrant of left female breast ([place]) 05/19/2014  . Opiate dependence ([place]) 05/01/2012    Class: Acute  . Homeless 05/01/2012    Class: Acute    Past Surgical History:  Procedure Laterality Date  . BREAST LUMPECTOMY WITH SENTINEL LYMPH NODE BIOPSY Left 06/11/2014   Procedure: BREAST LUMPECTOMY WITH SENTINEL LYMPH NODE BX, POSSIBLE AXILLARY LYMPH NODE DISSECTION;  Surgeon: Stark Klein, MD;  Location: [facility];  Service: General;  Laterality: Left;  . EVACUATION BREAST HEMATOMA Left 07/10/2014   Procedure: EVACUATION OF LEFT BREAST HEMATOMA ;  Surgeon: Stark Klein, MD;  Location: [facility];  Service: General;  Laterality: Left;  . FOOT SURGERY  2011   rt  . INCISION AND DRAINAGE Left 07/02/2014   Procedure: Drainage left breast seroma;  Surgeon: Stark Klein, MD;  Location: [facility];  Service: General;  Laterality: Left;  . INCISION AND DRAINAGE ABSCESS N/A 11/12/2014   Procedure: ASPIRATION AND INCISION AND DRAINAGE LEFT BREAST ABSCESS;  Surgeon: Michael Boston, MD;  Location: [facility];  Service: General;  Laterality: N/A;  . [place]   right  . PORT-A-CATH REMOVAL Left 07/22/2015   Procedure: REMOVAL PORT-A-CATH;  Surgeon: Stark Klein, MD;  Location: [facility];  Service: General;  Laterality: Left;  . PORTACATH PLACEMENT N/A 07/02/2014   Procedure: INSERTION PORT-A-CATH;  Surgeon: Stark Klein, MD;  Location: [facility];  Service: General;  Laterality: N/A;  . TUBAL LIGATION    . UTERINE FIBROID SURGERY  2005   ablation    OB History    Gravida Para Term Preterm AB Living   '3 3 3     3   '$ SAB TAB Ectopic Multiple Live Births           2       Home Medications    Prior to Admission medications   Medication Sig Start Date End Date Taking? Authorizing Provider  ALPRAZolam Duanne Moron) 1 MG tablet Take 1 mg by mouth 3 (three) times daily as needed for sleep or anxiety. 09/08/16   Historical Provider, MD  benzonatate (TESSALON) 100 MG capsule Take 1 capsule (100 mg total) by mouth 3 (three) times daily as needed for cough. 08/02/16   Silver Huguenin Elgergawy, MD  chlorpheniramine-HYDROcodone (TUSSIONEX) 10-8 MG/5ML SUER Take 5 mLs by mouth 2 (two) times daily as needed for cough. 09/14/16   Historical Provider, MD  diphenhydrAMINE (BENADRYL) 25 mg capsule Take 1 capsule (25 mg total) by mouth every 6 (six) hours as needed for itching (Hold for sedation). 08/05/16   Susanne Borders, NP  docusate sodium (COLACE) 100 MG capsule Take 1 capsule (100 mg total) by mouth daily. 08/02/16   Silver Huguenin Elgergawy, MD  feeding supplement, GLUCERNA SHAKE, (GLUCERNA SHAKE) LIQD Take 237 mLs by mouth 3 (three) times daily between meals. 07/15/16   Chauncey Cruel, MD  fluconazole (DIFLUCAN) 100 MG tablet Take 1 tablet (100 mg total) by mouth daily. Patient not taking: Reported on 09/26/2016 09/21/16   Chauncey Cruel, MD  glipiZIDE (GLUCOTROL XL) 5 MG 24 hr tablet Take 2 tablets (10 mg total) by mouth daily with breakfast. 07/25/16    Chauncey Cruel, MD  ipratropium-albuterol (DUONEB) 0.5-2.5 (3) MG/3ML SOLN Inhale 3 mLs into the lungs 3 (three) times daily as needed for shortness of breath or wheezing. 08/26/16   Historical Provider, MD  letrozole (FEMARA) 2.5 MG tablet Take 2.5 mg by mouth daily. 07/15/16   Historical Provider, MD  LORazepam (ATIVAN) 0.5 MG tablet Take 1 tablet (0.5 mg total) by mouth every 4 (four) hours as needed for anxiety. 08/05/16   Susanne Borders, NP  methadone (DOLOPHINE) 5 MG tablet Take 1 tablet (5 mg total) by mouth every 8 (eight) hours. Patient taking differently: Take 5 mg by mouth every 8 (eight) hours as needed for severe pain.  09/21/16   Chauncey Cruel, MD  mirtazapine (REMERON)  30 MG tablet Take 1 tablet (30 mg total) by mouth at bedtime. 08/12/16   Chauncey Cruel, MD  naproxen (NAPROSYN) 500 MG tablet Take 1 tablet (500 mg total) by mouth 2 (two) times daily with a meal. Patient not taking: Reported on 09/26/2016 08/02/16   Silver Huguenin Elgergawy, MD  ondansetron (ZOFRAN ODT) 4 MG disintegrating tablet Take 1 tablet (4 mg total) by mouth every 8 (eight) hours as needed for nausea or vomiting. 07/25/16   Chauncey Cruel, MD  pantoprazole (PROTONIX) 20 MG tablet Take 2 tablets (40 mg total) by mouth daily. Patient not taking: Reported on 09/26/2016 09/21/16   Chauncey Cruel, MD  PROAIR HFA 108 [id number] Base) MCG/ACT inhaler Inhale 2 puffs into the lungs every 6 (six) hours as needed for shortness of breath or wheezing. 08/26/16   Historical Provider, MD  prochlorperazine (COMPAZINE) 10 MG tablet Take 1 tablet (10 mg total) by mouth every 6 (six) hours as needed for nausea or vomiting. 09/21/16   Chauncey Cruel, MD    Family History Family History  Problem Relation Age of Onset  . Breast cancer Mother 13    currently 18; TAH/BSO d/t ?cervical ca at 40  . Lung cancer Father     deceased 66  . Breast cancer Maternal Aunt     dx 33s; currently in her late 24s  . Diabetes Neg Hx      Social History Social History  Substance Use Topics  . Smoking status: Former Smoker    Packs/day: 0.25    Years: 20.00    Types: Cigarettes  . Smokeless tobacco: Never Used  . Alcohol use No     Comment: Percoset,Vicodin, Oxycotin     Allergies   Hydrocodone   Review of Systems Review of Systems  Gastrointestinal: Positive for abdominal distention, abdominal pain, nausea and vomiting.  All other systems reviewed and are negative.    Physical Exam Updated Vital Signs BP (!) 136/101 (BP Location: Right Arm)   Pulse 101   Temp 98.4 F (36.9 C) (Oral)   Resp 18   SpO2 100%   Physical Exam  Constitutional: She is oriented to person, place, and time. She appears well-developed and well-nourished.  HENT:  Head: Normocephalic and atraumatic.  Mouth/Throat: Oropharynx is clear and moist.  Eyes: Conjunctivae and EOM are normal. Pupils are equal, round, and reactive to light.  Neck: Normal range of motion.  Cardiovascular: Normal rate, regular rhythm and normal heart sounds.   Pulmonary/Chest: Effort normal and breath sounds normal. She has no wheezes. She has no rhonchi. She has no rales.  Left breast is firm with multiple tumor like structures palpable beneath the skin; there are changes that resemble lymphedema of this breast  Abdominal: Soft. Bowel sounds are normal. There is generalized tenderness.  Abdomen is soft, not rigid; generalized tenderness; voluntary guarding  Musculoskeletal: Normal range of motion.  Neurological: She is alert and oriented to person, place, and time.  Skin: Skin is warm and dry.  Psychiatric: She has a normal mood and affect.  Nursing note and vitals reviewed.    ED Treatments / Results  Labs (all labs ordered are listed, but only abnormal results are displayed) Labs Reviewed  COMPREHENSIVE METABOLIC PANEL - Abnormal; Notable for the following:       Result Value   Chloride 97 (*)    Glucose, Bld 198 (*)    BUN 30 (*)    AST  55 (*)  Alkaline Phosphatase 223 (*)    All other components within normal limits  URINALYSIS, ROUTINE W REFLEX MICROSCOPIC - Abnormal; Notable for the following:    Specific Gravity, Urine >1.046 (*)    Glucose, UA 50 (*)    Leukocytes, UA TRACE (*)    Squamous Epithelial / LPF 0-5 (*)    All other components within normal limits  CBC WITH DIFFERENTIAL/PLATELET - Abnormal; Notable for the following:    WBC 12.3 (*)    Hemoglobin 11.9 (*)    RDW 16.5 (*)    Platelets 149 (*)    Neutro Abs 11.0 (*)    All other components within normal limits  I-STAT TROPOININ, ED - Abnormal; Notable for the following:    Troponin i, poc 0.21 (*)    All other components within normal limits  I-STAT CG4 LACTIC ACID, ED - Abnormal; Notable for the following:    Lactic Acid, Venous 2.51 (*)    All other components within normal limits  I-STAT CG4 LACTIC ACID, ED - Abnormal; Notable for the following:    Lactic Acid, Venous 3.64 (*)    All other components within normal limits  I-STAT TROPOININ, ED - Abnormal; Notable for the following:    Troponin i, poc 0.16 (*)    All other components within normal limits  CULTURE, BLOOD (ROUTINE X 2)  CULTURE, BLOOD (ROUTINE X 2)  URINE CULTURE  LIPASE, BLOOD  POC OCCULT BLOOD, ED  TYPE AND SCREEN  ABO/RH    EKG  EKG Interpretation None       Radiology Dg Chest 2 View  Result Date: 10/30/2016 CLINICAL DATA:  Pt c/o diffuse abd pain w/ hememesis x 2 days, hx metastatic breast ca w/ multiple rounds of chemo and 2 x rad over past two years. EXAM: CHEST  2 VIEW COMPARISON:  Chest CT and chest radiographs, 07/31/2016 FINDINGS: Cardiac silhouette is normal in size. No mediastinal or hilar masses. No evidence of adenopathy. There are irregular interstitial with mild hazy airspace opacities in the lower lungs, similar to the appearance on the prior chest radiograph. No pleural effusion. No pneumothorax. There are stable changes from left breast surgery.  Skeletal structures are unremarkable. IMPRESSION: 1. Interstitial and mild hazy airspace opacities in the lower lungs. These findings may be chronic, being similar in appearance to the prior chest radiograph. Acute interstitial infection or inflammation should be considered if there are consistent clinical symptoms. 2. No convincing pulmonary edema. 3. Stable changes from left breast surgery. Electronically Signed   By: Lajean Manes M.D.   On: 10/30/2016 12:33   Ct Abdomen Pelvis W Contrast  Result Date: 10/30/2016 CLINICAL DATA:  Pt c/o diffuse generalized abdominal pain, abdominal swelling, nausea, and emesis with streaks of red blood x 5 days. No diarrhea or blood in stool. Hx breast cancer. Chemo pill only. No IV chemo or radiation currently.Pt adds chest pain and tightness x 5 days. EXAM: CT ABDOMEN AND PELVIS WITH CONTRAST TECHNIQUE: Multidetector CT imaging of the abdomen and pelvis was performed using the standard protocol following bolus administration of intravenous contrast. CONTRAST:  199m ISOVUE-300 IOPAMIDOL (ISOVUE-300) INJECTION 61% COMPARISON:  06/03/2014 FINDINGS: Lower chest: Moderate pleural effusions, slightly greater on the right. Lung base interstitial thickening. Heart normal in size. Trace pericardial fluid Hepatobiliary: Large irregularly enhancing mass in the liver is centered on the medial 7 0 left lobe crossing to the anterior segment of the right lobe. It measures 13 x 4 x 10 cm. Enhancement is  patchy nodular on the initial post-contrast sequence and feels in most of the lesion on the delayed sequence. On the delayed sequence, there are wedge-shaped areas of relative increased enhancement along the peripheral right lobe. There is also relative increased attenuation/enhancement adjacent to the falciform ligament. The overall liver attenuation is decrease consistent with fatty infiltration. Liver is mildly enlarged. Gallbladder is unremarkable.  No bile duct dilation. Pancreas:  Unremarkable. No pancreatic ductal dilatation or surrounding inflammatory changes. Spleen: Normal in size without focal abnormality. Adrenals/Urinary Tract: Adrenal glands are unremarkable. Kidneys are normal, without renal calculi, focal lesion, or hydronephrosis. Bladder is unremarkable. Stomach/Bowel: Stomach is within normal limits. Appendix appears normal. No evidence of bowel wall thickening, distention, or inflammatory changes. Vascular/Lymphatic: Aortic atherosclerosis. No enlarged abdominal or pelvic lymph nodes. Reproductive: Uterus and bilateral adnexa are unremarkable. Other: No abdominal wall hernia or abnormality. No abdominopelvic ascites. Visualize left breast shows skin thickening consistent with the provided history of breast carcinoma. This Musculoskeletal: Multiple small lucent and multiple sclerotic bone lesions seen throughout the visualized spine and in the pelvis consistent with metastatic disease. Schmorl's nodes noted along the upper endplates of L2 and L4. IMPRESSION: 1. Findings all are consistent with metastatic disease. Given the history of breast carcinoma, this is likely the primary. 2. There is a large heterogeneously enhancing mass in the liver, which was not present on the prior CT. It measures 13 cm in greatest dimension. No other discrete liver masses. 3. There are multiple sclerotic and lucent bone lesions consistent with widespread metastatic disease to bone. 4. Moderate pleural effusions are noted with lung base interstitial thickening. Interstitial thickening is likely due to edema. No discrete liver base mass or nodule. 5. There is also mild hepatomegaly and diffuse hepatic steatosis. 6. No acute abnormalities in the abdomen or pelvis. Electronically Signed   By: Lajean Manes M.D.   On: 10/30/2016 13:19    Procedures Procedures (including critical care time)  Medications Ordered in ED Medications  ondansetron (ZOFRAN) injection 4 mg (0 mg Intravenous Hold 10/30/16  1230)  ondansetron (ZOFRAN) injection 4 mg (4 mg Intravenous Given 10/30/16 1205)  HYDROmorphone (DILAUDID) injection 1 mg (1 mg Intravenous Given 10/30/16 1205)  sodium chloride 0.9 % bolus 1,000 mL (0 mLs Intravenous Stopped 10/30/16 1558)  HYDROmorphone (DILAUDID) injection 1 mg (1 mg Intravenous Given 10/30/16 1243)  iopamidol (ISOVUE-300) 61 % injection (100 mLs Intravenous Contrast Given 10/30/16 1256)  HYDROmorphone (DILAUDID) injection 2 mg (2 mg Intravenous Given 10/30/16 1558)     Initial Impression / Assessment and Plan / ED Course  I have reviewed the triage vital signs and the nursing notes.  Pertinent labs & imaging results that were available during my care of the patient were reviewed by me and considered in my medical decision making (see chart for details).  Clinical Course    50 y.o. F here with generalized abdominal pain, nausea, and vomiting for the past 5 days.  Reported chest pain and tightness to triage, however did not mention this to me during exam and denies when questioned about this.  She is afebrile, non-toxic.  Overall appears well considering.  Abdomen is soft but with generalized tenderness.  She is on hospice, states she knows she is terminal and is "waiting to die".  Broad work-up started.  Patient given IVF, pain and nausea meds.  Will monitor closely.  CT with advancing metastatic disease, no acute process found.  Lab work with elevated troponin and lactic acid.  IVF given.  CXR with hazy opacities-- these appear chronic compared with prior films.  Patient without complaint of cough or fever to suggest acute CAP.   Additional pain meds ordered as pain has been somewhat difficult to control.  Unknown significance of her elevated troponin, questionable strain from her systemic disease.  EKG without acute findings.  Continues to deny chest pain or SOB.  Has had multiple PE studies in the past few months, all of which were normal.  On her baseline home O2 here,  sats stable. Will plan to repeat lactic and troponin.  Blood and urine cultures have been sent.  4:48 PM Now patient with improving troponin, but worsening lactate. Her pain is well controlled at this time after several doses of Dilaudid which she was previously taking at home.  She remains afebrile, non-toxic in appearance. I had a long, open discussion with patient and her daughter at the bedside about care goals. I discussed her lab results and imaging findings again in great detail. Patient understands that her disease is advancing and her symptoms may worsen. Giving her worsening lactate, have offered her additional IV fluids here in the ED vs admission for symptomatic control, however she states she would prefer to go home since there is no acute intervention we can offer her in the hospital aside from that. She does have home hospice in place that will be coming by and a few days to help adjust her medications and continue fluids.  Patient is of sound mind, capable of making her own medical decisions.  She and daughter both seem to have insight into her poor prognosis and likely advancement of her condition.  I have provided patient with scripts for the next few days to help with symptomatic control.  Patient and daughter understand that she should return here for any new/worsening symptoms such as increased pain not controlled by medication, fever, nausea, vomiting, etc.  They both acknowledged understanding and agreed with plan of care.  They will be following up with hospice and oncology.  Case discussed with attending physician, Dr. Maryan Rued, who has reviewed labs and imaging studies and agrees with work-up, assessment, and plan of care.  Final Clinical Impressions(s) / ED Diagnoses   Final diagnoses:  Metastatic breast cancer (HCC)  Abdominal pain, unspecified abdominal location    New Prescriptions New Prescriptions   HYDROMORPHONE (DILAUDID) 2 MG TABLET    Take 0.5 tablets (1 mg total)  by mouth every 4 (four) hours as needed.   ONDANSETRON (ZOFRAN ODT) 4 MG DISINTEGRATING TABLET    Take 1 tablet (4 mg total) by mouth every 8 (eight) hours as needed for nausea.     Larene Pickett, PA-C 10/30/16 1754    Larene Pickett, PA-C 10/30/16 1925    Blanchie Dessert, MD 10/31/16 [phone]

## 2016-10-31 LAB — URINE CULTURE: Culture: <10000 — AB

## 2016-11-01 ENCOUNTER — Inpatient Hospital Stay (HOSPITAL_COMMUNITY)
Admission: EM | Admit: 2016-11-01 | Discharge: 2016-11-03 | DRG: 598 | Disposition: A | Discharge status: Hospice | Payer: Medicaid Other | Attending: Family Medicine | Admitting: Family Medicine

## 2016-11-01 ENCOUNTER — Encounter (HOSPITAL_COMMUNITY): Payer: Self-pay

## 2016-11-01 DIAGNOSIS — J9611 Chronic respiratory failure with hypoxia: Secondary | ICD-10-CM | POA: Diagnosis present

## 2016-11-01 DIAGNOSIS — D696 Thrombocytopenia, unspecified: Secondary | ICD-10-CM | POA: Diagnosis present

## 2016-11-01 DIAGNOSIS — R160 Hepatomegaly, not elsewhere classified: Secondary | ICD-10-CM

## 2016-11-01 DIAGNOSIS — Z885 Allergy status to narcotic agent status: Secondary | ICD-10-CM

## 2016-11-01 DIAGNOSIS — Z7951 Long term (current) use of inhaled steroids: Secondary | ICD-10-CM

## 2016-11-01 DIAGNOSIS — Z66 Do not resuscitate: Secondary | ICD-10-CM | POA: Diagnosis present

## 2016-11-01 DIAGNOSIS — Z9851 Tubal ligation status: Secondary | ICD-10-CM

## 2016-11-01 DIAGNOSIS — C50919 Malignant neoplasm of unspecified site of unspecified female breast: Principal | ICD-10-CM | POA: Diagnosis present

## 2016-11-01 DIAGNOSIS — Z79899 Other long term (current) drug therapy: Secondary | ICD-10-CM

## 2016-11-01 DIAGNOSIS — E119 Type 2 diabetes mellitus without complications: Secondary | ICD-10-CM | POA: Diagnosis present

## 2016-11-01 DIAGNOSIS — Z801 Family history of malignant neoplasm of trachea, bronchus and lung: Secondary | ICD-10-CM

## 2016-11-01 DIAGNOSIS — C7931 Secondary malignant neoplasm of brain: Secondary | ICD-10-CM | POA: Diagnosis present

## 2016-11-01 DIAGNOSIS — R509 Fever, unspecified: Secondary | ICD-10-CM

## 2016-11-01 DIAGNOSIS — C799 Secondary malignant neoplasm of unspecified site: Secondary | ICD-10-CM

## 2016-11-01 DIAGNOSIS — R109 Unspecified abdominal pain: Secondary | ICD-10-CM | POA: Diagnosis present

## 2016-11-01 DIAGNOSIS — R1084 Generalized abdominal pain: Secondary | ICD-10-CM

## 2016-11-01 DIAGNOSIS — F17210 Nicotine dependence, cigarettes, uncomplicated: Secondary | ICD-10-CM | POA: Diagnosis present

## 2016-11-01 DIAGNOSIS — Z87891 Personal history of nicotine dependence: Secondary | ICD-10-CM

## 2016-11-01 DIAGNOSIS — M1990 Unspecified osteoarthritis, unspecified site: Secondary | ICD-10-CM | POA: Diagnosis present

## 2016-11-01 DIAGNOSIS — Z803 Family history of malignant neoplasm of breast: Secondary | ICD-10-CM

## 2016-11-01 DIAGNOSIS — C787 Secondary malignant neoplasm of liver and intrahepatic bile duct: Secondary | ICD-10-CM | POA: Diagnosis present

## 2016-11-01 DIAGNOSIS — R748 Abnormal levels of other serum enzymes: Secondary | ICD-10-CM | POA: Diagnosis present

## 2016-11-01 DIAGNOSIS — C7951 Secondary malignant neoplasm of bone: Secondary | ICD-10-CM | POA: Diagnosis present

## 2016-11-01 DIAGNOSIS — G893 Neoplasm related pain (acute) (chronic): Secondary | ICD-10-CM

## 2016-11-01 DIAGNOSIS — Z9889 Other specified postprocedural states: Secondary | ICD-10-CM

## 2016-11-01 LAB — COMPREHENSIVE METABOLIC PANEL
ALBUMIN: 3.6 g/dL (ref 3.5–5.0)
ALT: 49 U/L (ref 14–54)
ANION GAP: 9 (ref 5–15)
AST: 62 U/L — ABNORMAL HIGH (ref 15–41)
Alkaline Phosphatase: 212 U/L — ABNORMAL HIGH (ref 38–126)
BILIRUBIN TOTAL: 0.5 mg/dL (ref 0.3–1.2)
BUN: 24 mg/dL — ABNORMAL HIGH (ref 6–20)
CO2: 27 mmol/L (ref 22–32)
Calcium: 8.9 mg/dL (ref 8.9–10.3)
Chloride: 100 mmol/L — ABNORMAL LOW (ref 101–111)
Creatinine, Ser: 0.58 mg/dL (ref 0.44–1.00)
GFR calc non Af Amer: >60 mL/min (ref >60)
GLUCOSE: 205 mg/dL — AB (ref 65–99)
Potassium: 4.5 mmol/L (ref 3.5–5.1)
SODIUM: 136 mmol/L (ref 135–145)
TOTAL PROTEIN: 7 g/dL (ref 6.5–8.1)

## 2016-11-01 LAB — GLUCOSE, CAPILLARY: Glucose-Capillary: 394 mg/dL — ABNORMAL HIGH (ref 65–99)

## 2016-11-01 LAB — CBC WITH DIFFERENTIAL/PLATELET
BASOS PCT: 0 %
Basophils Absolute: 0 10*3/uL (ref 0.0–0.1)
EOS ABS: 0 10*3/uL (ref 0.0–0.7)
Eosinophils Relative: 0 %
HEMATOCRIT: 35 % — AB (ref 36.0–46.0)
Hemoglobin: 11.2 g/dL — ABNORMAL LOW (ref 12.0–15.0)
Lymphocytes Relative: 6 %
Lymphs Abs: 0.6 10*3/uL — ABNORMAL LOW (ref 0.7–4.0)
MCH: 29.6 pg (ref 26.0–34.0)
MCHC: 32 g/dL (ref 30.0–36.0)
MCV: 92.3 fL (ref 78.0–100.0)
MONO ABS: 0.5 10*3/uL (ref 0.1–1.0)
MONOS PCT: 4 %
Neutro Abs: 9.7 10*3/uL — ABNORMAL HIGH (ref 1.7–7.7)
Neutrophils Relative %: 90 %
Platelets: 130 10*3/uL — ABNORMAL LOW (ref 150–400)
RBC: 3.79 MIL/uL — ABNORMAL LOW (ref 3.87–5.11)
RDW: 16.2 % — AB (ref 11.5–15.5)
WBC: 10.8 10*3/uL — ABNORMAL HIGH (ref 4.0–10.5)

## 2016-11-01 LAB — LACTIC ACID, PLASMA: Lactic Acid, Venous: 1.6 mmol/L (ref 0.5–1.9)

## 2016-11-01 MED ORDER — ONDANSETRON HCL 4 MG PO TABS: 4.0000 mg | ORAL_TABLET | Freq: Four times a day (QID) | ORAL | Status: DC | PRN

## 2016-11-01 MED ORDER — HYDROMORPHONE HCL 2 MG/ML IJ SOLN
1.0000 mg | INTRAMUSCULAR | Status: DC | PRN
  Administered 2016-11-01 – 2016-11-03 (×17): 1 mg via INTRAVENOUS
  Filled 2016-11-01 (×19): qty 1

## 2016-11-01 MED ORDER — DOCUSATE SODIUM 100 MG PO CAPS
100.0000 mg | ORAL_CAPSULE | Freq: Every day | ORAL | Status: DC
  Administered 2016-11-01 – 2016-11-03 (×3): 100 mg via ORAL
  Filled 2016-11-01 (×3): qty 1

## 2016-11-01 MED ORDER — SODIUM CHLORIDE 0.9 % IV SOLN
INTRAVENOUS | Status: DC
  Administered 2016-11-01 – 2016-11-03 (×2): via INTRAVENOUS

## 2016-11-01 MED ORDER — ENOXAPARIN SODIUM 40 MG/0.4ML ~~LOC~~ SOLN: 40.0000 mg | SUBCUTANEOUS | Status: DC

## 2016-11-01 MED ORDER — ALBUTEROL SULFATE (2.5 MG/3ML) 0.083% IN NEBU: 2.5000 mg | INHALATION_SOLUTION | Freq: Four times a day (QID) | RESPIRATORY_TRACT | Status: DC | PRN

## 2016-11-01 MED ORDER — HYDROMORPHONE HCL 1 MG/ML IJ SOLN
1.0000 mg | INTRAMUSCULAR | Status: DC | PRN
  Administered 2016-11-01 (×3): 1 mg via INTRAVENOUS
  Filled 2016-11-01 (×3): qty 1

## 2016-11-01 MED ORDER — ONDANSETRON HCL 4 MG/2ML IJ SOLN
4.0000 mg | Freq: Four times a day (QID) | INTRAMUSCULAR | Status: DC | PRN
  Administered 2016-11-01 – 2016-11-02 (×2): 4 mg via INTRAVENOUS
  Filled 2016-11-01 (×3): qty 2

## 2016-11-01 MED ORDER — MIRTAZAPINE 30 MG PO TABS
30.0000 mg | ORAL_TABLET | Freq: Every day | ORAL | Status: DC
  Administered 2016-11-01 – 2016-11-02 (×2): 30 mg via ORAL
  Filled 2016-11-01: qty 1
  Filled 2016-11-01 (×2): qty 2
  Filled 2016-11-01: qty 1

## 2016-11-01 MED ORDER — GLUCERNA SHAKE PO LIQD
237.0000 mL | Freq: Three times a day (TID) | ORAL | Status: DC
  Administered 2016-11-01 – 2016-11-03 (×4): 237 mL via ORAL
  Filled 2016-11-01 (×8): qty 237

## 2016-11-01 MED ORDER — PANTOPRAZOLE SODIUM 40 MG PO TBEC
40.0000 mg | DELAYED_RELEASE_TABLET | Freq: Every day | ORAL | Status: DC
  Administered 2016-11-01 – 2016-11-03 (×3): 40 mg via ORAL
  Filled 2016-11-01 (×3): qty 1

## 2016-11-01 MED ORDER — INSULIN ASPART 100 UNIT/ML ~~LOC~~ SOLN
0.0000 [IU] | Freq: Three times a day (TID) | SUBCUTANEOUS | Status: DC
  Administered 2016-11-01: 9 [IU] via SUBCUTANEOUS
  Administered 2016-11-02: 12 [IU] via SUBCUTANEOUS
  Administered 2016-11-02: 1 [IU] via SUBCUTANEOUS
  Administered 2016-11-02: 17:00:00 via SUBCUTANEOUS
  Administered 2016-11-03 (×2): 2 [IU] via SUBCUTANEOUS

## 2016-11-01 MED ORDER — IPRATROPIUM-ALBUTEROL 0.5-2.5 (3) MG/3ML IN SOLN: 3.0000 mL | Freq: Three times a day (TID) | RESPIRATORY_TRACT | Status: DC | PRN

## 2016-11-01 MED ORDER — DIPHENHYDRAMINE HCL 25 MG PO CAPS: 25.0000 mg | ORAL_CAPSULE | Freq: Four times a day (QID) | ORAL | Status: DC | PRN

## 2016-11-01 MED ORDER — METHADONE HCL 5 MG PO TABS
5.0000 mg | ORAL_TABLET | Freq: Three times a day (TID) | ORAL | Status: DC
  Administered 2016-11-01 – 2016-11-03 (×6): 5 mg via ORAL
  Filled 2016-11-01 (×6): qty 1

## 2016-11-01 MED ORDER — ALPRAZOLAM 0.5 MG PO TABS
1.0000 mg | ORAL_TABLET | Freq: Three times a day (TID) | ORAL | Status: DC | PRN
  Administered 2016-11-02 (×3): 1 mg via ORAL
  Filled 2016-11-01 (×3): qty 2

## 2016-11-01 MED ORDER — SODIUM CHLORIDE 0.9 % IV SOLN
Freq: Once | INTRAVENOUS | Status: AC
  Administered 2016-11-01: 12:00:00 via INTRAVENOUS

## 2016-11-01 MED ORDER — HYDROMORPHONE HCL 2 MG/ML IJ SOLN: 2.0000 mg | INTRAMUSCULAR | Status: DC | PRN

## 2016-11-01 MED ORDER — LETROZOLE 2.5 MG PO TABS
2.5000 mg | ORAL_TABLET | Freq: Every day | ORAL | Status: DC
  Administered 2016-11-02 – 2016-11-03 (×2): 2.5 mg via ORAL
  Filled 2016-11-01 (×2): qty 1

## 2016-11-01 MED ORDER — BENZONATATE 100 MG PO CAPS
100.0000 mg | ORAL_CAPSULE | Freq: Three times a day (TID) | ORAL | Status: DC | PRN
  Administered 2016-11-03: 100 mg via ORAL
  Filled 2016-11-01: qty 1

## 2016-11-01 MED ORDER — FLUCONAZOLE 100 MG PO TABS
100.0000 mg | ORAL_TABLET | Freq: Every day | ORAL | Status: DC
  Administered 2016-11-01 – 2016-11-03 (×3): 100 mg via ORAL
  Filled 2016-11-01 (×3): qty 1

## 2016-11-01 NOTE — ED Notes (Signed)
Bed: WA09 Expected date:  Expected time:  Means of arrival:  Comments: EMS 

## 2016-11-01 NOTE — Progress Notes (Signed)
[facility] Liaison ED visit:  Visited with patient in the ED, patient states she has been having trouble getting her pain managed at home.   Patient states she is going to be admitted to hospital today and is possibly wanting [facility] when she is discharged home.     Patient states that she is feeling better since she received pain medication.   Spoke with Emergency Dept RN and she advised patient had been given Dilaudid 2 mg and had a decrease in pain.  Patient states 2/10 pain at this time, with medicine coming per Rn.   Will continue to watch patient as she transitions from ED to floor.    Thanks,  Edyth Gunnels, RN, BSN [facility] Liaison  All liasons are now in [place].  Please feel free to contact me at [phone] or call hospice directly at [phone].

## 2016-11-01 NOTE — H&P (Signed)
TRH H&P    Patient Demographics:    Laura Matthews, is a 51 y.o. female  MRN: [id number]  DOB - [birth year]  Admit Date - 11/01/2016  Referring MD/NP/PA: Dr Wilson Singer  Outpatient Primary MD for the patient is [name], MD  Patient coming from: Home  Chief Complaint  Patient presents with  . Abdominal Pain      HPI:    Laura Matthews  is a 51 y.o. female, With history of metastatic breast cancer with metastasis to liver found on recent CT abdomen/pelvis, which showed 13 cm mass in the liver. Patient was seen in the ED on Saturday and will discharge on Dilaudid for abdominal pain. Patient continues to have abdominal pain associated with nausea and vomiting. She denies diarrhea or constipation. Denies dysuria Patient says that she is on hospice and is not undergoing chemotherapy at this time. She also takes methadone 5 mg 3 times a day She also has been having intermittent chest pain but no shortness of breath.    Review of systems:    In addition to the HPI above,  No Fever-chills, No Headache, No changes with Vision or hearing, No problems swallowing food or Liquids, No Chest pain, Cough or Shortness of Breath, No Abdominal pain, No Nausea or Vomiting, bowel movements are regular, No Blood in stool or Urine, No dysuria, No new skin rashes or bruises, No new joints pains-aches,  No new weakness, tingling, numbness in any extremity, No recent weight gain or loss, No polyuria, polydypsia or polyphagia, No significant Mental Stressors.  A full 10 point Review of Systems was done, except as stated above, all other Review of Systems were negative.   With Past History of the following :    Past Medical History:  Diagnosis Date  . Anemia    low iron  . Arthritis   . Depression    pt denies   . Family history of malignant neoplasm of breast   . Head ache   . Hot flashes   . Lymphedema of  breast    left  . Malignant neoplasm of breast (female), unspecified site    left breast  . Mental disorder    depression-  . Newly diagnosed diabetes 11/12/2014   chemo increasing blood glucose; not on meds now  . Radiation 04/22/15-06/23/15   Left Breast      Past Surgical History:  Procedure Laterality Date  . BREAST LUMPECTOMY WITH SENTINEL LYMPH NODE BIOPSY Left 06/11/2014   Procedure: BREAST LUMPECTOMY WITH SENTINEL LYMPH NODE BX, POSSIBLE AXILLARY LYMPH NODE DISSECTION;  Surgeon: Stark Klein, MD;  Location: [facility];  Service: General;  Laterality: Left;  . EVACUATION BREAST HEMATOMA Left 07/10/2014   Procedure: EVACUATION OF LEFT BREAST HEMATOMA ;  Surgeon: Stark Klein, MD;  Location: [place];  Service: General;  Laterality: Left;  . FOOT SURGERY  2011   rt  . INCISION AND DRAINAGE Left 07/02/2014   Procedure: Drainage left breast seroma;  Surgeon: Stark Klein, MD;  Location: [place];  Service:  General;  Laterality: Left;  . INCISION AND DRAINAGE ABSCESS N/A 11/12/2014   Procedure: ASPIRATION AND INCISION AND DRAINAGE LEFT BREAST ABSCESS;  Surgeon: Michael Boston, MD;  Location: [facility];  Service: General;  Laterality: N/A;  . [place]   right  . PORT-A-CATH REMOVAL Left 07/22/2015   Procedure: REMOVAL PORT-A-CATH;  Surgeon: Stark Klein, MD;  Location: [place];  Service: General;  Laterality: Left;  . PORTACATH PLACEMENT N/A 07/02/2014   Procedure: INSERTION PORT-A-CATH;  Surgeon: Stark Klein, MD;  Location: [place];  Service: General;  Laterality: N/A;  . TUBAL LIGATION    . UTERINE FIBROID SURGERY  2005   ablation      Social History:      Social History  Substance Use Topics  . Smoking status: Former Smoker    Packs/day: 0.25    Years: 20.00    Types: Cigarettes  . Smokeless tobacco: Never Used  . Alcohol use No     Comment: Percoset,Vicodin, Oxycotin       Family History :       Family History  Problem Relation Age of Onset  . Breast cancer Mother 80    currently 48; TAH/BSO d/t ?cervical ca at 78  . Lung cancer Father     deceased 27  . Breast cancer Maternal Aunt     dx 36s; currently in her late 2s  . Diabetes Neg Hx       Home Medications:   Prior to Admission medications   Medication Sig Start Date End Date Taking? Authorizing Provider  ALPRAZolam Duanne Moron) 1 MG tablet Take 1 mg by mouth 3 (three) times daily as needed for sleep or anxiety. 09/08/16  Yes Historical Provider, MD  benzonatate (TESSALON) 100 MG capsule Take 1 capsule (100 mg total) by mouth 3 (three) times daily as needed for cough. 08/02/16  Yes Albertine Patricia, MD  docusate sodium (COLACE) 100 MG capsule Take 1 capsule (100 mg total) by mouth daily. 08/02/16  Yes Silver Huguenin Elgergawy, MD  feeding supplement, GLUCERNA SHAKE, (GLUCERNA SHAKE) LIQD Take 237 mLs by mouth 3 (three) times daily between meals. 07/15/16  Yes Chauncey Cruel, MD  fluconazole (DIFLUCAN) 100 MG tablet Take 1 tablet (100 mg total) by mouth daily. 09/21/16  Yes Chauncey Cruel, MD  glipiZIDE (GLUCOTROL XL) 5 MG 24 hr tablet Take 2 tablets (10 mg total) by mouth daily with breakfast. 07/25/16  Yes Chauncey Cruel, MD  HYDROmorphone (DILAUDID) 2 MG tablet Take 0.5 tablets (1 mg total) by mouth every 4 (four) hours as needed. 10/30/16  Yes Larene Pickett, PA-C  ipratropium-albuterol (DUONEB) 0.5-2.5 (3) MG/3ML SOLN Inhale 3 mLs into the lungs 3 (three) times daily as needed for shortness of breath or wheezing. 08/26/16  Yes Historical Provider, MD  letrozole (FEMARA) 2.5 MG tablet Take 2.5 mg by mouth daily. 07/15/16  Yes Historical Provider, MD  mirtazapine (REMERON) 30 MG tablet Take 1 tablet (30 mg total) by mouth at bedtime. 08/12/16  Yes Chauncey Cruel, MD  ondansetron (ZOFRAN ODT) 4 MG disintegrating tablet Take 1 tablet (4 mg total) by mouth every 8 (eight) hours as needed for nausea. 10/30/16  Yes Larene Pickett,  PA-C  oxycodone (ROXICODONE) 30 MG immediate release tablet Take 30 mg by mouth 4 (four) times daily as needed for pain. 10/17/16  Yes Historical Provider, MD  Oxycodone HCl 10 MG TABS Take 10 mg by mouth every 4 (four)  hours as needed for pain. 08/12/16  Yes Historical Provider, MD  PROAIR HFA 108 (90 Base) MCG/ACT inhaler Inhale 2 puffs into the lungs every 6 (six) hours as needed for shortness of breath or wheezing. 08/26/16  Yes Historical Provider, MD  prochlorperazine (COMPAZINE) 10 MG tablet Take 1 tablet (10 mg total) by mouth every 6 (six) hours as needed for nausea or vomiting. 09/21/16  Yes Chauncey Cruel, MD  diphenhydrAMINE (BENADRYL) 25 mg capsule Take 1 capsule (25 mg total) by mouth every 6 (six) hours as needed for itching (Hold for sedation). Patient not taking: Reported on 10/30/2016 08/05/16   Susanne Borders, NP  LORazepam (ATIVAN) 0.5 MG tablet Take 1 tablet (0.5 mg total) by mouth every 4 (four) hours as needed for anxiety. Patient not taking: Reported on 10/30/2016 08/05/16   Susanne Borders, NP  methadone (DOLOPHINE) 5 MG tablet Take 1 tablet (5 mg total) by mouth every 8 (eight) hours. Patient not taking: Reported on 10/30/2016 09/21/16   Chauncey Cruel, MD  naproxen (NAPROSYN) 500 MG tablet Take 1 tablet (500 mg total) by mouth 2 (two) times daily with a meal. Patient not taking: Reported on 10/30/2016 08/02/16   Silver Huguenin Elgergawy, MD  pantoprazole (PROTONIX) 20 MG tablet Take 2 tablets (40 mg total) by mouth daily. Patient not taking: Reported on 11/01/2016 09/21/16   Chauncey Cruel, MD     Allergies:     Allergies  Allergen Reactions  . Hydrocodone Itching and Nausea Only     Physical Exam:   Vitals  Blood pressure 131/82, pulse 94, temperature 98.1 F (36.7 C), temperature source Oral, resp. rate 16, SpO2 99 %.  1.  General: African-American female in no acute distress  2. Psychiatric:  Intact judgement and  insight, awake alert, oriented x  3.  3. Neurologic: No focal neurological deficits, all cranial nerves intact.Strength 5/5 all 4 extremities, sensation intact all 4 extremities, plantars down going.  4. Eyes :  anicteric sclerae, moist conjunctivae with no lid lag. PERRLA.  5. ENMT:  Oropharynx clear with moist mucous membranes and good dentition  6. Neck:  supple, no cervical lymphadenopathy appriciated, No thyromegaly  7. Respiratory : Normal respiratory effort, good air movement bilaterally,clear to  auscultation bilaterally  8. Cardiovascular : RRR, no gallops, rubs or murmurs, no leg edema  9. Gastrointestinal:  Positive bowel sounds, abdomen soft, positive generalized tenderness to palpation to palpation, positive hepatomegaly, no rigidity or guarding       10. Skin:  No cyanosis, normal texture and turgor, no rash, lesions or ulcers  11.Musculoskeletal:  Good muscle tone,  joints appear normal , no effusions,  normal range of motion    Data Review:    CBC  Recent Labs Lab 10/30/16 1137 11/01/16 1157  WBC 12.3* 10.8*  HGB 11.9* 11.2*  HCT 36.3 35.0*  PLT 149* 130*  MCV 91.2 92.3  MCH 29.9 29.6  MCHC 32.8 32.0  RDW 16.5* 16.2*  LYMPHSABS 0.7 0.6*  MONOABS 0.6 0.5  EOSABS 0.0 0.0  BASOSABS 0.1 0.0   ------------------------------------------------------------------------------------------------------------------  Chemistries   Recent Labs Lab 10/30/16 1137 11/01/16 1157  NA 136 136  K 4.5 4.5  CL 97* 100*  CO2 26 27  GLUCOSE 198* 205*  BUN 30* 24*  CREATININE 0.71 0.58  CALCIUM 9.1 8.9  AST 55* 62*  ALT 45 49  ALKPHOS 223* 212*  BILITOT 0.5 0.5   ------------------------------------------------------------------------------------------------------------------  ------------------------------------------------------------------------------------------------------------------ GFR: Estimated Creatinine Clearance: 91.8  mL/min (by C-G formula based on SCr of 0.58  mg/dL). Liver Function Tests:  Recent Labs Lab 10/30/16 1137 11/01/16 1157  AST 55* 62*  ALT 45 49  ALKPHOS 223* 212*  BILITOT 0.5 0.5  PROT 7.0 7.0  ALBUMIN 3.7 3.6    Recent Labs Lab 10/30/16 1137  LIPASE 26    --------------------------------------------------------------------------------------------------------------- Urine analysis:    Component Value Date/Time   COLORURINE YELLOW 10/30/2016 1424   APPEARANCEUR CLEAR 10/30/2016 1424   LABSPEC >1.046 (H) 10/30/2016 1424   PHURINE 7.0 10/30/2016 1424   GLUCOSEU 50 (A) 10/30/2016 1424   HGBUR NEGATIVE 10/30/2016 1424   BILIRUBINUR NEGATIVE 10/30/2016 1424   KETONESUR NEGATIVE 10/30/2016 1424   PROTEINUR NEGATIVE 10/30/2016 1424   UROBILINOGEN 0.2 06/03/2014 1050   NITRITE NEGATIVE 10/30/2016 1424   LEUKOCYTESUR TRACE (A) 10/30/2016 1424      Imaging Results:    No results found.     Assessment & Plan:    Active Problems:   Metastatic breast cancer ([place])   1. Pain associated with metastatic breast cancer-patient is on comfort care with hospice, will place her observation for pain management. Continue methadone 5 mg by mouth 3 times a day, started on Dilaudid 2 mg IV every 4 hours when necessary. [facility] has seen the patient and are planning to send her to residential hospice once pain under control. 2. Nausea and vomiting-likely side effect of opiates. Continue Zofran when necessary, start Reglan 5 mg every 6 hours. 3. Diabetes mellitus- will hold Glucotrol, start  insulin with NovoLog sensitive sliding scale   DVT Prophylaxis-   Lovenox   AM Labs Ordered, also please review Full Orders  Family Communication: Admission, patients condition and plan of care including tests being ordered have been discussed with the patient who indicate understanding and agree with the plan and Code Status.  Code Status:  DNR  Admission status: Observation    Time spent in minutes : 55 min   Huberta Tompkins S M.D on  11/01/2016 at 2:19 PM  Between 7am to 7pm - Pager - [phone]. After 7pm go to [email or web address] - password [facility]  [facility] - Office  [phone]

## 2016-11-01 NOTE — ED Triage Notes (Signed)
Pt presents from home via EMS with c/o abdominal pain. Pt is a cancer patient, reports that the pain is occurring because the cancer has spread to her liver. Pt was seen on Sunday at this facility, was asked to stay and declined and instead wanted to go home. Pt reports that she did take her home dilaudid with no relief. Pt was given '8mg'$  zofran en route by EMS with no relief and reports that she is still nauseated.

## 2016-11-01 NOTE — Progress Notes (Signed)
ED Cm called by Santiago Glad, ED PA, to inquire about how to process a pt who has come to [facility] ED for pain control and possible need of hospice placement CM referred Santiago Glad to [email or web address] to find on call hospice/palliative staff to review case with them to get recommendations and if pt needs hospice facility Cm referred her to ED SW contact number

## 2016-11-01 NOTE — ED Provider Notes (Signed)
[facility] DEPT Provider Note   CSN: [id number] Arrival date & time: 11/01/16  7371     History   Chief Complaint Chief Complaint  Patient presents with  . Abdominal Pain    HPI Laura Matthews is a 51 y.o. female.  The history is provided by the patient. No language interpreter was used.  Abdominal Pain   This is a recurrent problem. The current episode started more than 1 week ago. The problem occurs constantly. The problem has been gradually worsening. The pain is associated with eating. The pain is located in the generalized abdominal region. The pain is at a severity of 10/10. The pain is severe. Associated symptoms include anorexia. Nothing aggravates the symptoms. Nothing relieves the symptoms. Past workup includes CT scan.  Pt has a history of breast ca with metastatic disease.  Pt complains of abdominal pain.  Pt reports she has been told she has liver mets.  Pt is being followed by hospice.  Pt was seen here on Saturday and went home with dilaudid for pain.  She is also on methadone.  Pt complains of increased pain.  Pt called and was told to come in for hospice admission for pain control  Past Medical History:  Diagnosis Date  . Anemia    low iron  . Arthritis   . Depression    pt denies   . Family history of malignant neoplasm of breast   . Head ache   . Hot flashes   . Lymphedema of breast    left  . Malignant neoplasm of breast (female), unspecified site    left breast  . Mental disorder    depression-  . Newly diagnosed diabetes 11/12/2014   chemo increasing blood glucose; not on meds now  . Radiation 04/22/15-06/23/15   Left Breast    Patient Active Problem List   Diagnosis Date Noted  . Advance care planning   . Goals of care, counseling/discussion   . Neoplasm related pain   . Palliative care by specialist   . Respiratory failure with hypoxia ([place]) 07/31/2016  . Acute on chronic respiratory failure with hypoxia ([place]) 07/31/2016  . Metastatic  breast cancer ([place]) 07/31/2016  . Cancer, metastatic to bone ([place]) 06/10/2016  . Brain metastasis ([place])   . Protein-calorie malnutrition, severe 06/09/2016  . Pleural effusion   . S/P thoracentesis   . Acute respiratory failure with hypoxia ([place]) 06/07/2016  . Back pain 06/07/2016  . Protein-calorie malnutrition, moderate ([place]) 06/07/2016  . Cough   . Hypoxia   . Lung metastases ([place])   . Shortness of breath   . Bronchitis 05/25/2016  . Chest pain 02/02/2015  . Depression 01/26/2015  . Abscess of left breast 11/12/2014  . Diabetes mellitus without complication ([place]) 04/25/9484  . Neutropenia ([place]) 08/05/2014  . Lymphedema of breast 08/05/2014  . Breast cancer metastasized to axillary lymph node ([place]) 06/11/2014  . Family history of malignant neoplasm of breast   . Breast cancer of upper-outer quadrant of left female breast ([place]) 05/19/2014  . Opiate dependence ([place]) 05/01/2012    Class: Acute  . Homeless 05/01/2012    Class: Acute    Past Surgical History:  Procedure Laterality Date  . BREAST LUMPECTOMY WITH SENTINEL LYMPH NODE BIOPSY Left 06/11/2014   Procedure: BREAST LUMPECTOMY WITH SENTINEL LYMPH NODE BX, POSSIBLE AXILLARY LYMPH NODE DISSECTION;  Surgeon: Stark Klein, MD;  Location: [facility];  Service: General;  Laterality: Left;  . EVACUATION BREAST HEMATOMA Left 07/10/2014  Procedure: EVACUATION OF LEFT BREAST HEMATOMA ;  Surgeon: Stark Klein, MD;  Location: [facility];  Service: General;  Laterality: Left;  . FOOT SURGERY  2011   rt  . INCISION AND DRAINAGE Left 07/02/2014   Procedure: Drainage left breast seroma;  Surgeon: Stark Klein, MD;  Location: [facility];  Service: General;  Laterality: Left;  . INCISION AND DRAINAGE ABSCESS N/A 11/12/2014   Procedure: ASPIRATION AND INCISION AND DRAINAGE LEFT BREAST ABSCESS;  Surgeon: Michael Boston, MD;  Location: [facility];  Service: General;  Laterality: N/A;  . [place]   right  .  PORT-A-CATH REMOVAL Left 07/22/2015   Procedure: REMOVAL PORT-A-CATH;  Surgeon: Stark Klein, MD;  Location: [facility];  Service: General;  Laterality: Left;  . PORTACATH PLACEMENT N/A 07/02/2014   Procedure: INSERTION PORT-A-CATH;  Surgeon: Stark Klein, MD;  Location: [facility];  Service: General;  Laterality: N/A;  . TUBAL LIGATION    . UTERINE FIBROID SURGERY  2005   ablation    OB History    Gravida Para Term Preterm AB Living   '3 3 3     3   '$ SAB TAB Ectopic Multiple Live Births           2       Home Medications    Prior to Admission medications   Medication Sig Start Date End Date Taking? Authorizing Provider  ALPRAZolam Duanne Moron) 1 MG tablet Take 1 mg by mouth 3 (three) times daily as needed for sleep or anxiety. 09/08/16  Yes Historical Provider, MD  benzonatate (TESSALON) 100 MG capsule Take 1 capsule (100 mg total) by mouth 3 (three) times daily as needed for cough. 08/02/16  Yes Albertine Patricia, MD  docusate sodium (COLACE) 100 MG capsule Take 1 capsule (100 mg total) by mouth daily. 08/02/16  Yes Silver Huguenin Elgergawy, MD  feeding supplement, GLUCERNA SHAKE, (GLUCERNA SHAKE) LIQD Take 237 mLs by mouth 3 (three) times daily between meals. 07/15/16  Yes Chauncey Cruel, MD  fluconazole (DIFLUCAN) 100 MG tablet Take 1 tablet (100 mg total) by mouth daily. 09/21/16  Yes Chauncey Cruel, MD  glipiZIDE (GLUCOTROL XL) 5 MG 24 hr tablet Take 2 tablets (10 mg total) by mouth daily with breakfast. 07/25/16  Yes Chauncey Cruel, MD  HYDROmorphone (DILAUDID) 2 MG tablet Take 0.5 tablets (1 mg total) by mouth every 4 (four) hours as needed. 10/30/16  Yes Larene Pickett, PA-C  ipratropium-albuterol (DUONEB) 0.5-2.5 (3) MG/3ML SOLN Inhale 3 mLs into the lungs 3 (three) times daily as needed for shortness of breath or wheezing. 08/26/16  Yes Historical Provider, MD  letrozole (FEMARA) 2.5 MG tablet Take 2.5 mg by mouth daily. 07/15/16  Yes Historical Provider, MD   mirtazapine (REMERON) 30 MG tablet Take 1 tablet (30 mg total) by mouth at bedtime. 08/12/16  Yes Chauncey Cruel, MD  ondansetron (ZOFRAN ODT) 4 MG disintegrating tablet Take 1 tablet (4 mg total) by mouth every 8 (eight) hours as needed for nausea. 10/30/16  Yes Larene Pickett, PA-C  oxycodone (ROXICODONE) 30 MG immediate release tablet Take 30 mg by mouth 4 (four) times daily as needed for pain. 10/17/16  Yes Historical Provider, MD  Oxycodone HCl 10 MG TABS Take 10 mg by mouth every 4 (four) hours as needed for pain. 08/12/16  Yes Historical Provider, MD  PROAIR HFA 108 (90 Base) MCG/ACT inhaler Inhale 2 puffs into the lungs every 6 (  six) hours as needed for shortness of breath or wheezing. 08/26/16  Yes Historical Provider, MD  prochlorperazine (COMPAZINE) 10 MG tablet Take 1 tablet (10 mg total) by mouth every 6 (six) hours as needed for nausea or vomiting. 09/21/16  Yes Chauncey Cruel, MD  diphenhydrAMINE (BENADRYL) 25 mg capsule Take 1 capsule (25 mg total) by mouth every 6 (six) hours as needed for itching (Hold for sedation). Patient not taking: Reported on 10/30/2016 08/05/16   Susanne Borders, NP  LORazepam (ATIVAN) 0.5 MG tablet Take 1 tablet (0.5 mg total) by mouth every 4 (four) hours as needed for anxiety. Patient not taking: Reported on 10/30/2016 08/05/16   Susanne Borders, NP  methadone (DOLOPHINE) 5 MG tablet Take 1 tablet (5 mg total) by mouth every 8 (eight) hours. Patient not taking: Reported on 10/30/2016 09/21/16   Chauncey Cruel, MD  naproxen (NAPROSYN) 500 MG tablet Take 1 tablet (500 mg total) by mouth 2 (two) times daily with a meal. Patient not taking: Reported on 10/30/2016 08/02/16   Silver Huguenin Elgergawy, MD  pantoprazole (PROTONIX) 20 MG tablet Take 2 tablets (40 mg total) by mouth daily. Patient not taking: Reported on 11/01/2016 09/21/16   Chauncey Cruel, MD    Family History Family History  Problem Relation Age of Onset  . Breast cancer Mother 3     currently 32; TAH/BSO d/t ?cervical ca at 39  . Lung cancer Father     deceased 21  . Breast cancer Maternal Aunt     dx 65s; currently in her late 82s  . Diabetes Neg Hx     Social History Social History  Substance Use Topics  . Smoking status: Former Smoker    Packs/day: 0.25    Years: 20.00    Types: Cigarettes  . Smokeless tobacco: Never Used  . Alcohol use No     Comment: Percoset,Vicodin, Oxycotin     Allergies   Hydrocodone   Review of Systems Review of Systems  Gastrointestinal: Positive for abdominal pain and anorexia.  All other systems reviewed and are negative.    Physical Exam Updated Vital Signs BP 131/82 (BP Location: Right Arm)   Pulse 94   Temp 98.1 F (36.7 C) (Oral)   Resp 16   SpO2 99%   Physical Exam  Constitutional: She appears well-developed and well-nourished. No distress.  HENT:  Head: Normocephalic and atraumatic.  Eyes: Conjunctivae are normal.  Neck: Neck supple.  Cardiovascular: Normal rate and regular rhythm.   No murmur heard. Pulmonary/Chest: Effort normal and breath sounds normal. No respiratory distress.  Abdominal: Soft. There is no tenderness.  Musculoskeletal: She exhibits no edema.  Neurological: She is alert.  Skin: Skin is warm and dry.  Psychiatric: She has a normal mood and affect.  Nursing note and vitals reviewed.    ED Treatments / Results  Labs (all labs ordered are listed, but only abnormal results are displayed) Labs Reviewed  CBC WITH DIFFERENTIAL/PLATELET - Abnormal; Notable for the following:       Result Value   WBC 10.8 (*)    RBC 3.79 (*)    Hemoglobin 11.2 (*)    HCT 35.0 (*)    RDW 16.2 (*)    Platelets 130 (*)    Neutro Abs 9.7 (*)    Lymphs Abs 0.6 (*)    All other components within normal limits  COMPREHENSIVE METABOLIC PANEL - Abnormal; Notable for the following:    Chloride 100 (*)  Glucose, Bld 205 (*)    BUN 24 (*)    AST 62 (*)    Alkaline Phosphatase 212 (*)    All other  components within normal limits  LACTIC ACID, PLASMA  URINALYSIS, ROUTINE W REFLEX MICROSCOPIC    EKG  EKG Interpretation None       Radiology Ct Abdomen Pelvis W Contrast  Result Date: 10/30/2016 CLINICAL DATA:  Pt c/o diffuse generalized abdominal pain, abdominal swelling, nausea, and emesis with streaks of red blood x 5 days. No diarrhea or blood in stool. Hx breast cancer. Chemo pill only. No IV chemo or radiation currently.Pt adds chest pain and tightness x 5 days. EXAM: CT ABDOMEN AND PELVIS WITH CONTRAST TECHNIQUE: Multidetector CT imaging of the abdomen and pelvis was performed using the standard protocol following bolus administration of intravenous contrast. CONTRAST:  111m ISOVUE-300 IOPAMIDOL (ISOVUE-300) INJECTION 61% COMPARISON:  06/03/2014 FINDINGS: Lower chest: Moderate pleural effusions, slightly greater on the right. Lung base interstitial thickening. Heart normal in size. Trace pericardial fluid Hepatobiliary: Large irregularly enhancing mass in the liver is centered on the medial 7 0 left lobe crossing to the anterior segment of the right lobe. It measures 13 x 4 x 10 cm. Enhancement is patchy nodular on the initial post-contrast sequence and feels in most of the lesion on the delayed sequence. On the delayed sequence, there are wedge-shaped areas of relative increased enhancement along the peripheral right lobe. There is also relative increased attenuation/enhancement adjacent to the falciform ligament. The overall liver attenuation is decrease consistent with fatty infiltration. Liver is mildly enlarged. Gallbladder is unremarkable.  No bile duct dilation. Pancreas: Unremarkable. No pancreatic ductal dilatation or surrounding inflammatory changes. Spleen: Normal in size without focal abnormality. Adrenals/Urinary Tract: Adrenal glands are unremarkable. Kidneys are normal, without renal calculi, focal lesion, or hydronephrosis. Bladder is unremarkable. Stomach/Bowel: Stomach is  within normal limits. Appendix appears normal. No evidence of bowel wall thickening, distention, or inflammatory changes. Vascular/Lymphatic: Aortic atherosclerosis. No enlarged abdominal or pelvic lymph nodes. Reproductive: Uterus and bilateral adnexa are unremarkable. Other: No abdominal wall hernia or abnormality. No abdominopelvic ascites. Visualize left breast shows skin thickening consistent with the provided history of breast carcinoma. This Musculoskeletal: Multiple small lucent and multiple sclerotic bone lesions seen throughout the visualized spine and in the pelvis consistent with metastatic disease. Schmorl's nodes noted along the upper endplates of L2 and L4. IMPRESSION: 1. Findings all are consistent with metastatic disease. Given the history of breast carcinoma, this is likely the primary. 2. There is a large heterogeneously enhancing mass in the liver, which was not present on the prior CT. It measures 13 cm in greatest dimension. No other discrete liver masses. 3. There are multiple sclerotic and lucent bone lesions consistent with widespread metastatic disease to bone. 4. Moderate pleural effusions are noted with lung base interstitial thickening. Interstitial thickening is likely due to edema. No discrete liver base mass or nodule. 5. There is also mild hepatomegaly and diffuse hepatic steatosis. 6. No acute abnormalities in the abdomen or pelvis. Electronically Signed   By: DLajean ManesM.D.   On: 10/30/2016 13:19    Procedures Procedures (including critical care time)  Medications Ordered in ED Medications  HYDROmorphone (DILAUDID) injection 1 mg (1 mg Intravenous Given 11/01/16 1233)  0.9 %  sodium chloride infusion ( Intravenous New Bag/Given 11/01/16 1206)     Initial Impression / Assessment and Plan / ED Course  I have reviewed the triage vital signs and the nursing  notes.  Pertinent labs & imaging results that were available during my care of the patient were reviewed by me and  considered in my medical decision making (see chart for details).  Clinical Course     Pt given Iv fluids, Dilaudid.   I spoke with hospice.  (they are not actively following) I discussed with Dr. [name] who advised hospitalist to admit for pain control.  I discussed with Dr. Darrick Meigs who will admit.  Final Clinical Impressions(s) / ED Diagnoses   Final diagnoses:  Generalized abdominal pain  Metastatic cancer ([place])  Cancer related pain    New Prescriptions New Prescriptions   No medications on file     Fransico Meadow, PA-C 11/01/16 [place], PA-C 11/01/16 [place], MD 11/03/16 1356

## 2016-11-02 DIAGNOSIS — D696 Thrombocytopenia, unspecified: Secondary | ICD-10-CM | POA: Diagnosis present

## 2016-11-02 DIAGNOSIS — Z87891 Personal history of nicotine dependence: Secondary | ICD-10-CM | POA: Diagnosis not present

## 2016-11-02 DIAGNOSIS — Z885 Allergy status to narcotic agent status: Secondary | ICD-10-CM | POA: Diagnosis not present

## 2016-11-02 DIAGNOSIS — F17210 Nicotine dependence, cigarettes, uncomplicated: Secondary | ICD-10-CM | POA: Diagnosis present

## 2016-11-02 DIAGNOSIS — C7931 Secondary malignant neoplasm of brain: Secondary | ICD-10-CM | POA: Diagnosis present

## 2016-11-02 DIAGNOSIS — R1084 Generalized abdominal pain: Secondary | ICD-10-CM | POA: Diagnosis not present

## 2016-11-02 DIAGNOSIS — Z79899 Other long term (current) drug therapy: Secondary | ICD-10-CM | POA: Diagnosis not present

## 2016-11-02 DIAGNOSIS — C787 Secondary malignant neoplasm of liver and intrahepatic bile duct: Secondary | ICD-10-CM | POA: Diagnosis present

## 2016-11-02 DIAGNOSIS — Z803 Family history of malignant neoplasm of breast: Secondary | ICD-10-CM | POA: Diagnosis not present

## 2016-11-02 DIAGNOSIS — Z801 Family history of malignant neoplasm of trachea, bronchus and lung: Secondary | ICD-10-CM | POA: Diagnosis not present

## 2016-11-02 DIAGNOSIS — Z7951 Long term (current) use of inhaled steroids: Secondary | ICD-10-CM | POA: Diagnosis not present

## 2016-11-02 DIAGNOSIS — R748 Abnormal levels of other serum enzymes: Secondary | ICD-10-CM | POA: Diagnosis present

## 2016-11-02 DIAGNOSIS — M1990 Unspecified osteoarthritis, unspecified site: Secondary | ICD-10-CM | POA: Diagnosis present

## 2016-11-02 DIAGNOSIS — C50919 Malignant neoplasm of unspecified site of unspecified female breast: Secondary | ICD-10-CM | POA: Diagnosis not present

## 2016-11-02 DIAGNOSIS — Z9851 Tubal ligation status: Secondary | ICD-10-CM | POA: Diagnosis not present

## 2016-11-02 DIAGNOSIS — J9611 Chronic respiratory failure with hypoxia: Secondary | ICD-10-CM | POA: Diagnosis present

## 2016-11-02 DIAGNOSIS — C799 Secondary malignant neoplasm of unspecified site: Secondary | ICD-10-CM | POA: Diagnosis not present

## 2016-11-02 DIAGNOSIS — Z66 Do not resuscitate: Secondary | ICD-10-CM | POA: Diagnosis present

## 2016-11-02 DIAGNOSIS — G893 Neoplasm related pain (acute) (chronic): Secondary | ICD-10-CM | POA: Diagnosis present

## 2016-11-02 DIAGNOSIS — E119 Type 2 diabetes mellitus without complications: Secondary | ICD-10-CM | POA: Diagnosis not present

## 2016-11-02 DIAGNOSIS — C7951 Secondary malignant neoplasm of bone: Secondary | ICD-10-CM | POA: Diagnosis not present

## 2016-11-02 DIAGNOSIS — Z9889 Other specified postprocedural states: Secondary | ICD-10-CM | POA: Diagnosis not present

## 2016-11-02 LAB — CBC
HCT: 31.1 % — ABNORMAL LOW (ref 36.0–46.0)
Hemoglobin: 10.4 g/dL — ABNORMAL LOW (ref 12.0–15.0)
MCH: 30.4 pg (ref 26.0–34.0)
MCHC: 33.4 g/dL (ref 30.0–36.0)
MCV: 90.9 fL (ref 78.0–100.0)
PLATELETS: 114 10*3/uL — AB (ref 150–400)
RBC: 3.42 MIL/uL — ABNORMAL LOW (ref 3.87–5.11)
RDW: 16.2 % — AB (ref 11.5–15.5)
WBC: 7.8 10*3/uL (ref 4.0–10.5)

## 2016-11-02 LAB — COMPREHENSIVE METABOLIC PANEL
ALBUMIN: 3.4 g/dL — AB (ref 3.5–5.0)
ALT: 45 U/L (ref 14–54)
ANION GAP: 9 (ref 5–15)
AST: 57 U/L — AB (ref 15–41)
Alkaline Phosphatase: 192 U/L — ABNORMAL HIGH (ref 38–126)
BUN: 18 mg/dL (ref 6–20)
CHLORIDE: 103 mmol/L (ref 101–111)
CO2: 24 mmol/L (ref 22–32)
Calcium: 8.4 mg/dL — ABNORMAL LOW (ref 8.9–10.3)
Creatinine, Ser: 0.47 mg/dL (ref 0.44–1.00)
GFR calc Af Amer: >60 mL/min (ref >60)
Glucose, Bld: 100 mg/dL — ABNORMAL HIGH (ref 65–99)
POTASSIUM: 4.2 mmol/L (ref 3.5–5.1)
Sodium: 136 mmol/L (ref 135–145)
Total Bilirubin: 0.3 mg/dL (ref 0.3–1.2)
Total Protein: 5.8 g/dL — ABNORMAL LOW (ref 6.5–8.1)

## 2016-11-02 LAB — GLUCOSE, CAPILLARY
GLUCOSE-CAPILLARY: 217 mg/dL — AB (ref 65–99)
GLUCOSE-CAPILLARY: 175 mg/dL — AB (ref 65–99)
Glucose-Capillary: 145 mg/dL — ABNORMAL HIGH (ref 65–99)
Glucose-Capillary: 401 mg/dL — ABNORMAL HIGH (ref 65–99)

## 2016-11-02 LAB — HEMOGLOBIN A1C
Hgb A1c MFr Bld: 10.3 % — ABNORMAL HIGH (ref 4.8–5.6)
MEAN PLASMA GLUCOSE: 249 mg/dL

## 2016-11-02 NOTE — Progress Notes (Signed)
Nutrition Brief Note  Pt identified as at nutrition risk on the Malnutrition Screen Tool.  Chart reviewed. Pt on comfort care.  No nutrition interventions warranted at this time.  Please consult as needed.   Clayton Bibles, MS, RD, LDN Pager: [phone] After Hours Pager: [phone]

## 2016-11-02 NOTE — Progress Notes (Signed)
[facility] Work note LCSW met with patient to offer support, address transfer to [employer] [facility]. Patient shared desire to go for further sx management of pain. LCSW offered education about facility and discussed services, plan for return home as desired. Patient was having pain and called nsg staff. RN, Jess arrived and offered pain medication and patient eventually reported that her pain was better managed. LCSW offered support related to grief/loss issues and news that cancer has spread to her liver. There are no beds at [facility] today and patient agrees to transfer when available. Katherina Right, [employer]

## 2016-11-02 NOTE — Progress Notes (Signed)
Laura Matthews and left message on voicemail for Laura Matthews at [phone].   Patient is eligible to go to [facility] tomorrow for symptom management.  There is no bed availability today.  This is short term symptom management then patient would go home.  If MD's are able to get symptoms managed in the hospital, patient could be discharged home.  Spoke with Laura Stalls, RN, to advise of same.  She advised she will call the doctor to see if they can adjust her medications to PO and try to get her symptoms under control.   Thanks. Edyth Gunnels, RN, BSN [facility] Liaison  All hospital liaisons are now on [place].  Please feel free to call me at [phone] or you can call hospice at [phone].

## 2016-11-02 NOTE — Progress Notes (Signed)
CSW contacted by MD to assist with residential hospice home placement. CSW met with pt at bedside and confirmed she is interested in residential hospice home placement. Pt confirms that she is followed by [facility] and would like [facility] placement at d/c. CSW has contacted Amy from [facility] and [facility] referral has been made. A decision is pending. CSW will continue to follow to assist with d/c planning needs.  Werner Lean LCSW [phone]

## 2016-11-02 NOTE — Progress Notes (Signed)
Inpatient Diabetes Program Recommendations  AACE/ADA: New Consensus Statement on Inpatient Glycemic Control (2015)  Target Ranges:  Prepandial:   less than 140 mg/dL      Peak postprandial:   less than 180 mg/dL (1-2 hours)      Critically ill patients:  140 - 180 mg/dL  Results for Laura Matthews, Laura Matthews (MRN [id number]) as of 11/02/2016 10:15  Ref. Range 11/01/2016 18:01 11/02/2016 07:41  Glucose-Capillary Latest Ref Range: 65 - 99 mg/dL 394 (H) 145 (H)   Results for Laura Matthews, Laura Matthews (MRN [id number]) as of 11/02/2016 10:15  Ref. Range 11/01/2016 15:45  Hemoglobin A1C Latest Ref Range: 4.8 - 5.6 % 10.3 (H)   Review of Glycemic Control  Diabetes history: DM2 Outpatient Diabetes medications: Glipizide XL 10 mg QAM Current orders for Inpatient glycemic control: Novolog 0-9 units TID with meals  Inpatient Diabetes Program Recommendations: Correction (SSI): Please consider ordering Novolog 0-5 units QHS for bedtime correction. Insulin - Meal Coverage: While inpatient, please consider ordering Novolog 3 units TID with meals for meal coverage if patient eats at least 50% of meal.  NOTE: A1C 10.3% on 11/01/16 indicating an average glucose of 249 mg/dl over the past 2-3 months. Per H&P patient is on comfort care under Hospice.  Thanks, Barnie Alderman, RN, MSN, CDE Diabetes Coordinator Inpatient Diabetes Program [phone] (Team Pager from 8am to 5pm)

## 2016-11-02 NOTE — Progress Notes (Signed)
[facility]  PROGRESS NOTE  Laura Matthews EPP:[id number] DOB: 1966-06-05 DOA: 11/01/2016 PCP: [name], MD   Brief HPI:   51 y.o. female, With history of metastatic breast cancer with metastasis to liver found on recent CT abdomen/pelvis, which showed 13 cm mass in the liver. Patient was seen in the ED on Saturday and will discharge on Dilaudid for abdominal pain. Patient continues to have abdominal pain associated with nausea and vomiting. She denies diarrhea or constipation. Denies dysuria Patient says that she is on hospice and is not undergoing chemotherapy at this time. She also takes methadone 5 mg 3 times a day She also has been having intermittent chest pain but no shortness of breath.     Subjective   Patient denies pain this morning.   Assessment/Plan:     1. Pain associated with metastatic breast cancer-patient is on comfort care with hospice, will place her observation for pain management. Continue methadone 5 mg by mouth 3 times a day, started on Dilaudid 2 mg IV every 4 hours when necessary. [facility] has seen the patient and are planning to send her to residential hospice once pain under control. 2. Nausea and vomiting-likely side effect of opiates. Continue Zofran when necessary, start Reglan 5 mg every 6 hours. 3. Diabetes mellitus- will hold Glucotrol, start  insulin with NovoLog sensitive sliding scale    DVT prophylaxis: Lovenox  Code Status: DO NOT RESUSCITATE  Family Communication: No family present at bedside   Disposition Plan: Patient to be discharged to residential hospice once bed becomes available   Consultants:  None   Procedures:  None   Continuous infusions . sodium chloride Stopped (11/02/16 0750)      Antibiotics:   Anti-infectives    Start     Dose/Rate Route Frequency Ordered Stop   11/01/16 1545  fluconazole (DIFLUCAN) tablet 100 mg     100 mg Oral Daily 11/01/16 1533         Objective   Vitals:   11/01/16  1627 11/01/16 2002 11/01/16 2219 11/02/16 0550  BP: (!) 162/98 (!) 157/97 (!) 169/70 (!) 141/87  Pulse: 91 68 86 (!) 103  Resp: '18 18 16 18  '$ Temp: 98.7 F (37.1 C) 98.3 F (36.8 C) 99 F (37.2 C) 99.1 F (37.3 C)  TempSrc: Oral Oral Oral Oral  SpO2: 100% 100% 100% 100%  Weight: 67.6 kg (149 lb)     Height: '5\' 10"'$  (1.778 m)       Intake/Output Summary (Last 24 hours) at 11/02/16 1624 Last data filed at 11/02/16 0750  Gross per 24 hour  Intake           1739.5 ml  Output                0 ml  Net           1739.5 ml   Filed Weights   11/01/16 1627  Weight: 67.6 kg (149 lb)     Physical Examination:  General exam: Appears calm and comfortable. Respiratory system: Clear to auscultation. Respiratory effort normal. Cardiovascular system:  RRR. No  murmurs, rubs, gallops. No pedal edema. GI system: Abdomen is nondistended, soft and nontender. No organomegaly.  Central nervous system. No focal neurological deficits. 5 x 5 power in all extremities. Skin: No rashes, lesions or ulcers. Psychiatry: Alert, oriented x 3.Judgement and insight appear normal. Affect normal.    Data Reviewed: I have personally reviewed following labs and imaging studies  CBG:  Recent  Labs Lab 11/01/16 1801 11/02/16 0741 11/02/16 1149  GLUCAP 394* 145* 401*    CBC:  Recent Labs Lab 10/30/16 1137 11/01/16 1157 11/02/16 0512  WBC 12.3* 10.8* 7.8  NEUTROABS 11.0* 9.7*  --   HGB 11.9* 11.2* 10.4*  HCT 36.3 35.0* 31.1*  MCV 91.2 92.3 90.9  PLT 149* 130* 114*    Basic Metabolic Panel:  Recent Labs Lab 10/30/16 1137 11/01/16 1157 11/02/16 0512  NA 136 136 136  K 4.5 4.5 4.2  CL 97* 100* 103  CO2 '26 27 24  '$ GLUCOSE 198* 205* 100*  BUN 30* 24* 18  CREATININE 0.71 0.58 0.47  CALCIUM 9.1 8.9 8.4*    Recent Results (from the past 240 hour(s))  Culture, blood (Routine X 2) w Reflex to ID Panel     Status: None (Preliminary result)   Collection Time: 10/30/16 12:04 PM  Result Value  Ref Range Status   Specimen Description BLOOD RIGHT ARM  Final   Special Requests BOTTLES DRAWN AEROBIC AND ANAEROBIC 5 CC EACH  Final   Culture   Final    NO GROWTH 3 DAYS Performed at [facility]    Report Status PENDING  Incomplete  Culture, blood (Routine X 2) w Reflex to ID Panel     Status: None (Preliminary result)   Collection Time: 10/30/16 12:04 PM  Result Value Ref Range Status   Specimen Description BLOOD LEFT ARM  Final   Special Requests BOTTLES DRAWN AEROBIC AND ANAEROBIC 5 CC EACH  Final   Culture   Final    NO GROWTH 3 DAYS Performed at [facility]    Report Status PENDING  Incomplete  Urine culture     Status: Abnormal   Collection Time: 10/30/16  2:24 PM  Result Value Ref Range Status   Specimen Description URINE, RANDOM  Final   Special Requests NONE  Final   Culture (A)  Final    <10,000 COLONIES/mL INSIGNIFICANT GROWTH Performed at [facility]    Report Status 10/31/2016 FINAL  Final     Liver Function Tests:  Recent Labs Lab 10/30/16 1137 11/01/16 1157 11/02/16 0512  AST 55* 62* 57*  ALT 45 49 45  ALKPHOS 223* 212* 192*  BILITOT 0.5 0.5 0.3  PROT 7.0 7.0 5.8*  ALBUMIN 3.7 3.6 3.4*    Recent Labs Lab 10/30/16 1137  LIPASE 26   No results for input(s): AMMONIA in the last 168 hours.  Cardiac Enzymes: No results for input(s): CKTOTAL, CKMB, CKMBINDEX, TROPONINI in the last 168 hours. BNP (last 3 results)  Recent Labs  05/19/16 2134 07/09/16 0033  BNP 17.2 21.9    ProBNP (last 3 results) No results for input(s): PROBNP in the last 8760 hours.    Studies: No results found.  Scheduled Meds: . docusate sodium  100 mg Oral Daily  . feeding supplement (GLUCERNA SHAKE)  237 mL Oral TID BM  . fluconazole  100 mg Oral Daily  . insulin aspart  0-9 Units Subcutaneous TID WC  . letrozole  2.5 mg Oral Daily  . methadone  5 mg Oral Q8H  . mirtazapine  30 mg Oral QHS  . pantoprazole  40 mg Oral Daily       Time spent: 25 min  [facility] Pager [phone]. If 7PM-7AM, please contact night-coverage at [email or web address], Office  [phone]  password [email or web address] 11/02/2016, 4:24 PM  LOS: 1 day

## 2016-11-02 NOTE — Progress Notes (Signed)
[facility] Liaison:  GIP hospital visit at [facility] at 0830am  Patient to [facility] ED for treatment of increased pain/pain management problems.  Family did not contact [facility] prior to transport and attempted symptom management at home with medications available.  This is a related admission per Dr. Cherie Ouch, [facility] TERMINAL  DIAGNOSIS:  Breast cancer (C50.919) with RELATED  DISEASES: Brain metastasis (C79.31), Lung metastasis (C78.00), Bone metastasis (C79.51), Spinal metastasis (C79.40) that admitted to [facility] as of 08/03/2016.  Patient is a DNR code.  Patient seen at bedside, she advised that she is feeling better than yesterday.  Patient states still having pain 5/10 today.  Patient was given hydromorphone (dilaudid) 1 mg IV at [place], [place], [place].   Patient states it has helped, but concerned that she will not have same results when she returns home.  Spoke with staff RN who advised that patient seems to do well with the IV regimen, but the goal will be to get her on PO medications before being discharged.  Per Roselyn Reef, CSW, MD requesting placement at [facility] for pain management.  Family was not present for the visit.  Will follow up with Dr. Tomasa Hosteller on BP placement from hospital.  Will continue to follow patient while in hospital.  Thank you,   Edyth Gunnels, RN, BSN [facility] Liaison  All liaisons are now on [place].  Please feel free to contact me directly at [phone] or contact hospice at [phone].

## 2016-11-03 ENCOUNTER — Inpatient Hospital Stay (HOSPITAL_COMMUNITY): Payer: Medicaid Other

## 2016-11-03 DIAGNOSIS — E119 Type 2 diabetes mellitus without complications: Secondary | ICD-10-CM

## 2016-11-03 DIAGNOSIS — C50919 Malignant neoplasm of unspecified site of unspecified female breast: Principal | ICD-10-CM

## 2016-11-03 DIAGNOSIS — R1084 Generalized abdominal pain: Secondary | ICD-10-CM

## 2016-11-03 DIAGNOSIS — C7951 Secondary malignant neoplasm of bone: Secondary | ICD-10-CM

## 2016-11-03 LAB — CBC WITH DIFFERENTIAL/PLATELET
BASOS ABS: 0 10*3/uL (ref 0.0–0.1)
Basophils Relative: 0 %
Eosinophils Absolute: 0 10*3/uL (ref 0.0–0.7)
Eosinophils Relative: 0 %
HEMATOCRIT: 33.3 % — AB (ref 36.0–46.0)
HEMOGLOBIN: 11.2 g/dL — AB (ref 12.0–15.0)
LYMPHS PCT: 10 %
Lymphs Abs: 0.8 10*3/uL (ref 0.7–4.0)
MCH: 30 pg (ref 26.0–34.0)
MCHC: 33.6 g/dL (ref 30.0–36.0)
MCV: 89.3 fL (ref 78.0–100.0)
Monocytes Absolute: 0.4 10*3/uL (ref 0.1–1.0)
Monocytes Relative: 5 %
NEUTROS PCT: 85 %
Neutro Abs: 6.5 10*3/uL (ref 1.7–7.7)
Platelets: 93 10*3/uL — ABNORMAL LOW (ref 150–400)
RBC: 3.73 MIL/uL — AB (ref 3.87–5.11)
RDW: 16.3 % — ABNORMAL HIGH (ref 11.5–15.5)
WBC: 7.7 10*3/uL (ref 4.0–10.5)

## 2016-11-03 LAB — GLUCOSE, CAPILLARY
GLUCOSE-CAPILLARY: 196 mg/dL — AB (ref 65–99)
GLUCOSE-CAPILLARY: 187 mg/dL — AB (ref 65–99)

## 2016-11-03 LAB — LACTIC ACID, PLASMA: LACTIC ACID, VENOUS: 1.4 mmol/L (ref 0.5–1.9)

## 2016-11-03 MED ORDER — SODIUM CHLORIDE 0.9 % IV BOLUS (SEPSIS)
500.0000 mL | Freq: Once | INTRAVENOUS | Status: AC
  Administered 2016-11-03: 500 mL via INTRAVENOUS

## 2016-11-03 MED ORDER — HYDROMORPHONE HCL 2 MG/ML IJ SOLN
1.5000 mg | INTRAMUSCULAR | Status: DC | PRN
  Administered 2016-11-03: 1.5 mg via INTRAVENOUS

## 2016-11-03 MED ORDER — HYDROMORPHONE HCL 2 MG/ML IJ SOLN
2.0000 mg | INTRAMUSCULAR | Status: DC | PRN
Start: 2016-11-03 — End: 2016-11-03

## 2016-11-03 NOTE — Progress Notes (Signed)
CSW assisting with d/c planning. Pt has bed at [facility] today. Pt is in agreement with this plan. MD paged requesting D/C Summary and signature on DNR form in chart. EMS transport required. Medical necessity form completed. CSW will send d/c summary to [facility] once available and arrange transport.   Werner Lean LCSW [phone]

## 2016-11-03 NOTE — Progress Notes (Signed)
11/03/16  1305  Report called to [place] Report given to [facility].

## 2016-11-03 NOTE — Progress Notes (Signed)
[facility] Liaison  Spoke with Werner Lean, LCSW, she advised that she is still working with patient's RN to get patient d/c'd and to BP.   Thank you, Edyth Gunnels, RN, BSN  [phone]  All hospital liaisons are on [place].  Please call me with any questions directly or you can call Hospice at [phone] after 5pm.

## 2016-11-03 NOTE — Discharge Summary (Addendum)
Physician Discharge Summary  Laura Matthews EYC:[id number] DOB: [birth year] DOA: 11/01/2016  PCP: [name], MD  Admit date: 11/01/2016 Discharge date: 11/03/2016  Recommendations for Outpatient Follow-up:  1. Discharging to residential hospice 2. Pain control and symptom management   Discharge Diagnoses:  1. Metastatic breast cancer involving the lungs, bones, brain 2. Acute on chronic pain secondary to malignancy 3. Nausea 4. Chronic hypoxic respiratory failure secondary to tumor spread and lymphangitic carcinomatosis 5. Diabetes mellitus type 2 6. Thrombocytopenia  Discharge Condition: Fair Disposition: Residential hospice  Diet recommendation: Regular  Filed Weights   11/01/16 1627  Weight: 67.6 kg (149 lb)    History of present illness:  51 year old woman with metastatic breast cancer with disease involving brain, lungs, bone, on hospice as an outpatient, presented to the emergency department for ongoing abdominal pain associated with nausea and vomiting. She was admitted for symptom management and pain control.  Hospital Course:  Chronic pain medications were continued, supplemented by IV pain medication. Acute on chronic pain ongoing. Patient desired placement at residential hospice for optimization of pain control and symptom management and hospitalization was prolonged until [facility] could be secured. At this point she desires transfer to this facility for further pain control. She has multiple medical issues the great majority of which are related to her metastatic disease and are detailed below. Weighing acute and chronic issues in the context of comfort care, hospice and patient desires, the most appropriate next step is transfer to residential hospice for hospitalization of pain control. See below for individual issues.  1. Metastatic breast cancer involving the brain, lungs, bone. Not a candidate for chemotherapy. Already in hospice as an outpatient  (recommended by oncology). Admitted for pain control. 2. Chronic pain secondary to malignancy, poorly controlled. Currently receiving methadone 5 mg by mouth 3 times a day, Dilaudid 1 mg IV every 2 when necessary with 5 doses since midnight as far. Will increase Dilaudid dose. Discussed with patient she prefers transfer to [facility] today, I think this is reasonable where her symptoms can be better controlled. 3. Nausea. Secondary to opiates. Antiemetics as needed. 4. Chronic hypoxic respiratory failure felt secondary to tumor spread and lymphangitic carcinomatosis. CT abdomen and pelvis as well as chest x-rays consistent with interstitial disease. Lungs clear on examination. 5. Fever, MAXIMUM TEMPERATURE 101.8. Likely secondary to tumor. Blood cultures no growth thus far, urine culture insignificant growth, abnormal chest x-ray finding secondary to known metastatic disease. Suspect related to tumor. No evidence of infection at this point. Recommend supportive care and follow clinically for symptom management given goals of care. 6. Diabetes mellitus type 2, hemoglobin A1c 10.3. Stable. No longer on steroids (oncology recommended discontinuing steroids). Resume glipizide on discharge. 7. Elevated troponin. Troponins minimally elevated, trended down. EKG nonacute. No evidence of ACS. Suspect troponin leak related to multiple chronic issues, acute on chronic pain. Patient has chronic ongoing chest pain which is long-standing and not different. No further evaluation suggested. 8. Elevated lactic acid, returned normal. Urinalysis negative. Chest x-ray findings old. No evidence of infection. Blood cultures no growth thus far. Urine culture insignificant growth. Suspect poor oral intake. 9. Thrombocytopenia presumably secondary to malignancy. Supportive care.   Discharge Instructions  Discharge Instructions    Diet general    Complete by:  As directed    Increase activity slowly    Complete by:  As  directed      Allergies as of 11/03/2016      Reactions   Hydrocodone  Itching, Nausea Only      Medication List    STOP taking these medications   diphenhydrAMINE 25 mg capsule Commonly known as:  BENADRYL   LORazepam 0.5 MG tablet Commonly known as:  ATIVAN   naproxen 500 MG tablet Commonly known as:  NAPROSYN   oxycodone 30 MG immediate release tablet Commonly known as:  ROXICODONE   Oxycodone HCl 10 MG Tabs     TAKE these medications   ALPRAZolam 1 MG tablet Commonly known as:  XANAX Take 1 mg by mouth 3 (three) times daily as needed for sleep or anxiety.   benzonatate 100 MG capsule Commonly known as:  TESSALON Take 1 capsule (100 mg total) by mouth 3 (three) times daily as needed for cough.   docusate sodium 100 MG capsule Commonly known as:  COLACE Take 1 capsule (100 mg total) by mouth daily.   feeding supplement (GLUCERNA SHAKE) Liqd Take 237 mLs by mouth 3 (three) times daily between meals.   fluconazole 100 MG tablet Commonly known as:  DIFLUCAN Take 1 tablet (100 mg total) by mouth daily.   glipiZIDE 5 MG 24 hr tablet Commonly known as:  GLUCOTROL XL Take 2 tablets (10 mg total) by mouth daily with breakfast.   HYDROmorphone 2 MG tablet Commonly known as:  DILAUDID Take 0.5 tablets (1 mg total) by mouth every 4 (four) hours as needed.   ipratropium-albuterol 0.5-2.5 (3) MG/3ML Soln Commonly known as:  DUONEB Inhale 3 mLs into the lungs 3 (three) times daily as needed for shortness of breath or wheezing.   letrozole 2.5 MG tablet Commonly known as:  FEMARA Take 2.5 mg by mouth daily.   methadone 5 MG tablet Commonly known as:  DOLOPHINE Take 1 tablet (5 mg total) by mouth every 8 (eight) hours.   mirtazapine 30 MG tablet Commonly known as:  REMERON Take 1 tablet (30 mg total) by mouth at bedtime.   ondansetron 4 MG disintegrating tablet Commonly known as:  ZOFRAN ODT Take 1 tablet (4 mg total) by mouth every 8 (eight) hours as needed  for nausea.   pantoprazole 20 MG tablet Commonly known as:  PROTONIX Take 2 tablets (40 mg total) by mouth daily.   PROAIR HFA 108 (90 Base) MCG/ACT inhaler Generic drug:  albuterol Inhale 2 puffs into the lungs every 6 (six) hours as needed for shortness of breath or wheezing.   prochlorperazine 10 MG tablet Commonly known as:  COMPAZINE Take 1 tablet (10 mg total) by mouth every 6 (six) hours as needed for nausea or vomiting.      Allergies  Allergen Reactions  . Hydrocodone Itching and Nausea Only    The results of significant diagnostics from this hospitalization (including imaging, microbiology, ancillary and laboratory) are listed below for reference.    Significant Diagnostic Studies: Dg Chest 2 View  Result Date: 10/30/2016 CLINICAL DATA:  Pt c/o diffuse abd pain w/ hememesis x 2 days, hx metastatic breast ca w/ multiple rounds of chemo and 2 x rad over past two years. EXAM: CHEST  2 VIEW COMPARISON:  Chest CT and chest radiographs, 07/31/2016 FINDINGS: Cardiac silhouette is normal in size. No mediastinal or hilar masses. No evidence of adenopathy. There are irregular interstitial with mild hazy airspace opacities in the lower lungs, similar to the appearance on the prior chest radiograph. No pleural effusion. No pneumothorax. There are stable changes from left breast surgery. Skeletal structures are unremarkable. IMPRESSION: 1. Interstitial and mild hazy airspace opacities in the  lower lungs. These findings may be chronic, being similar in appearance to the prior chest radiograph. Acute interstitial infection or inflammation should be considered if there are consistent clinical symptoms. 2. No convincing pulmonary edema. 3. Stable changes from left breast surgery. Electronically Signed   By: Lajean Manes M.D.   On: 10/30/2016 12:33   Ct Abdomen Pelvis W Contrast  Result Date: 10/30/2016 CLINICAL DATA:  Pt c/o diffuse generalized abdominal pain, abdominal swelling, nausea,  and emesis with streaks of red blood x 5 days. No diarrhea or blood in stool. Hx breast cancer. Chemo pill only. No IV chemo or radiation currently.Pt adds chest pain and tightness x 5 days. EXAM: CT ABDOMEN AND PELVIS WITH CONTRAST TECHNIQUE: Multidetector CT imaging of the abdomen and pelvis was performed using the standard protocol following bolus administration of intravenous contrast. CONTRAST:  15m ISOVUE-300 IOPAMIDOL (ISOVUE-300) INJECTION 61% COMPARISON:  06/03/2014 FINDINGS: Lower chest: Moderate pleural effusions, slightly greater on the right. Lung base interstitial thickening. Heart normal in size. Trace pericardial fluid Hepatobiliary: Large irregularly enhancing mass in the liver is centered on the medial 7 0 left lobe crossing to the anterior segment of the right lobe. It measures 13 x 4 x 10 cm. Enhancement is patchy nodular on the initial post-contrast sequence and feels in most of the lesion on the delayed sequence. On the delayed sequence, there are wedge-shaped areas of relative increased enhancement along the peripheral right lobe. There is also relative increased attenuation/enhancement adjacent to the falciform ligament. The overall liver attenuation is decrease consistent with fatty infiltration. Liver is mildly enlarged. Gallbladder is unremarkable.  No bile duct dilation. Pancreas: Unremarkable. No pancreatic ductal dilatation or surrounding inflammatory changes. Spleen: Normal in size without focal abnormality. Adrenals/Urinary Tract: Adrenal glands are unremarkable. Kidneys are normal, without renal calculi, focal lesion, or hydronephrosis. Bladder is unremarkable. Stomach/Bowel: Stomach is within normal limits. Appendix appears normal. No evidence of bowel wall thickening, distention, or inflammatory changes. Vascular/Lymphatic: Aortic atherosclerosis. No enlarged abdominal or pelvic lymph nodes. Reproductive: Uterus and bilateral adnexa are unremarkable. Other: No abdominal wall  hernia or abnormality. No abdominopelvic ascites. Visualize left breast shows skin thickening consistent with the provided history of breast carcinoma. This Musculoskeletal: Multiple small lucent and multiple sclerotic bone lesions seen throughout the visualized spine and in the pelvis consistent with metastatic disease. Schmorl's nodes noted along the upper endplates of L2 and L4. IMPRESSION: 1. Findings all are consistent with metastatic disease. Given the history of breast carcinoma, this is likely the primary. 2. There is a large heterogeneously enhancing mass in the liver, which was not present on the prior CT. It measures 13 cm in greatest dimension. No other discrete liver masses. 3. There are multiple sclerotic and lucent bone lesions consistent with widespread metastatic disease to bone. 4. Moderate pleural effusions are noted with lung base interstitial thickening. Interstitial thickening is likely due to edema. No discrete liver base mass or nodule. 5. There is also mild hepatomegaly and diffuse hepatic steatosis. 6. No acute abnormalities in the abdomen or pelvis. Electronically Signed   By: DLajean ManesM.D.   On: 10/30/2016 13:19   Dg Chest Port 1 View  Result Date: 11/03/2016 CLINICAL DATA:  Breast cancer.  Fever. EXAM: PORTABLE CHEST 1 VIEW COMPARISON:  10/30/2016 . FINDINGS: Mediastinum hilar structures stable. Heart size stable. Diffuse bilateral pulmonary interstitial prominence again noted. Findings could be related to pneumonitis, interstitial edema, and/or interstitial tumor spread. Rounded densities noted over the upper lungs. This may  represent components of the interstitial changes. Metastatic disease cannot be excluded. Small bilateral pleural effusions. No pneumothorax. No acute bony abnormality . Surgical clips noted over the left axilla. IMPRESSION: 1. Bilateral interstitial prominence again noted. Findings could be related to pneumonitis, interstitial edema, and /or interstitial  tumor spread. Associated small bilateral pleural effusions noted . 2. Rounded densities noted over both upper lungs. These could represent a confluence of the interstitial changes, metastatic disease cannot be excluded. Electronically Signed   By: Marcello Moores  Register   On: 11/03/2016 08:33    Microbiology: Recent Results (from the past 240 hour(s))  Culture, blood (Routine X 2) w Reflex to ID Panel     Status: None (Preliminary result)   Collection Time: 10/30/16 12:04 PM  Result Value Ref Range Status   Specimen Description BLOOD RIGHT ARM  Final   Special Requests BOTTLES DRAWN AEROBIC AND ANAEROBIC 5 CC EACH  Final   Culture   Final    NO GROWTH 3 DAYS Performed at [facility]    Report Status PENDING  Incomplete  Culture, blood (Routine X 2) w Reflex to ID Panel     Status: None (Preliminary result)   Collection Time: 10/30/16 12:04 PM  Result Value Ref Range Status   Specimen Description BLOOD LEFT ARM  Final   Special Requests BOTTLES DRAWN AEROBIC AND ANAEROBIC 5 CC EACH  Final   Culture   Final    NO GROWTH 3 DAYS Performed at [facility]    Report Status PENDING  Incomplete  Urine culture     Status: Abnormal   Collection Time: 10/30/16  2:24 PM  Result Value Ref Range Status   Specimen Description URINE, RANDOM  Final   Special Requests NONE  Final   Culture (A)  Final    <10,000 COLONIES/mL INSIGNIFICANT GROWTH Performed at [facility]    Report Status 10/31/2016 FINAL  Final     Labs: Basic Metabolic Panel:  Recent Labs Lab 10/30/16 1137 11/01/16 1157 11/02/16 0512  NA 136 136 136  K 4.5 4.5 4.2  CL 97* 100* 103  CO2 '26 27 24  '$ GLUCOSE 198* 205* 100*  BUN 30* 24* 18  CREATININE 0.71 0.58 0.47  CALCIUM 9.1 8.9 8.4*   Liver Function Tests:  Recent Labs Lab 10/30/16 1137 11/01/16 1157 11/02/16 0512  AST 55* 62* 57*  ALT 45 49 45  ALKPHOS 223* 212* 192*  BILITOT 0.5 0.5 0.3  PROT 7.0 7.0 5.8*  ALBUMIN 3.7 3.6 3.4*     Recent Labs Lab 10/30/16 1137  LIPASE 26   CBC:  Recent Labs Lab 10/30/16 1137 11/01/16 1157 11/02/16 0512 11/03/16 0710  WBC 12.3* 10.8* 7.8 7.7  NEUTROABS 11.0* 9.7*  --  6.5  HGB 11.9* 11.2* 10.4* 11.2*  HCT 36.3 35.0* 31.1* 33.3*  MCV 91.2 92.3 90.9 89.3  PLT 149* 130* 114* 93*    CBG:  Recent Labs Lab 11/02/16 1149 11/02/16 1645 11/02/16 2139 11/03/16 0729 11/03/16 1211  GLUCAP 401* 217* 175* 196* 187*    Active Problems:   Metastatic breast cancer ([place])   Abdominal pain   Time coordinating discharge: 45 minutes  Signed:  Murray Hodgkins, MD [facility] 11/03/2016, 12:37 PM

## 2016-11-03 NOTE — Progress Notes (Signed)
[facility] Liaison visit:  Received request from [place] for patient to go to [facility] on discharge today.  Met with patient yesterday and she states that she confirms.  Patient agreeable to transfer today.  CSW aware.  Registration paperwork completed by [facility] SW.  Please fax discharge summary to [phone].  RN please call report to [phone].  Please arrange transport for the patient to arrive before noon if possible.    Thank you, Edyth Gunnels, RN, [facility] Liaison  All hospital liaisons are now on [place].  Please feel free to call me directly at [phone] or call hospice at [phone].

## 2016-11-03 NOTE — Progress Notes (Signed)
CSW assisting with d/c planning. D/C Summary sent to [facility]. EMS transport called for pick up. Nsg has provided report.  Werner Lean LCSW [phone]

## 2016-11-03 NOTE — Progress Notes (Signed)
PROGRESS NOTE  Laura Matthews CBJ:[id number] DOB: 04-05-1966 DOA: 11/01/2016 PCP: [name], MD  Brief Narrative: 51 year old woman with metastatic breast cancer with disease involving brain, lungs, bone, on hospice as an outpatient, presented to the emergency department for ongoing abdominal pain associated with nausea and vomiting. She was admitted for symptom management and pain control. She elected going to [facility] for symptom management and bed has been secured 1/4. Plan transfer to [facility] for pain control and symptom management. Discussed with patient this morning she is agreeable to and desires this plan.  Assessment/Plan 1. Metastatic breast cancer involving the brain, lungs, bone. Not a candidate for chemotherapy. Already in hospice as an outpatient (recommended by oncology). Admitted for pain control. 2. Chronic pain secondary to malignancy, poorly controlled. Currently receiving methadone 5 mg by mouth 3 times a day, Dilaudid 1 mg IV every 2 when necessary with 5 doses since midnight as far. Will increase Dilaudid dose. Discussed with patient she prefers transfer to [facility], I think this is reasonable where her symptoms can be better controlled. 3. Nausea. Secondary to opiates. Antiemetics as needed. 4. Chronic hypoxic respiratory failure felt secondary to tumor spread and lymphangitic carcinomatosis. CT abdomen and pelvis as well as chest x-rays consistent with interstitial disease. Lungs clear on examination. 5. Fever, MAXIMUM TEMPERATURE 101.8. May be secondary to tumor. Blood cultures no growth thus far, urine culture insignificant growth, abnormal chest x-ray finding secondary to known metastatic disease. Suspect related to tumor. No evidence of infection at this point. Recommend supportive care and follow clinically for symptom management given goals of care. 6. Diabetes mellitus type 2, hemoglobin A1c 10.3. Stable. No longer on steroids (oncology recommended  discontinuing steroids). Resume glipizide on discharge. 7. Elevated troponin. Troponins minimally elevated, trended down. EKG nonacute. No evidence of ACS. Suspect troponin leak related to multiple chronic issues, acute on chronic pain. Patient has chronic ongoing chest pain which is long-standing and not different. No further evaluation suggested. 8. Elevated lactic acid, returned normal. Urinalysis negative. Chest x-ray findings old. No evidence of infection. Blood cultures no growth thus far. Urine culture insignificant growth. Suspect poor oral intake. 9. Thrombocytopenia presumably secondary to malignancy.   Metastatic breast cancer with poorly controlled pain. Goal is pain control and continue hospice care. No further workup is indicated at this point based on the goals of care and patient desire to transfer to [facility]  Murray Hodgkins, MD  [facility] Direct contact: [phone] --Via amion app OR  --[email or web address]; password [email or web address]  7PM-7AM contact night coverage as above 11/03/2016, 11:52 AM  LOS: 2 days   Consultants:    Procedures:    Antimicrobials:    Interval history/Subjective: Ongoing lower abdominal and bilateral leg bony pain, acute (one week) on chronic.  Objective: Vitals:   11/02/16 0550 11/02/16 2145 11/03/16 0549 11/03/16 0641  BP: (!) 141/87 100/78 118/78   Pulse: (!) 103 (!) 126 (!) 130   Resp: '18 18 18   '$ Temp: 99.1 F (37.3 C) 99.4 F (37.4 C) (!) 101.8 F (38.8 C) (!) 101.3 F (38.5 C)  TempSrc: Oral Oral Oral   SpO2: 100% 93% 100%   Weight:      Height:        Intake/Output Summary (Last 24 hours) at 11/03/16 1152 Last data filed at 11/03/16 1030  Gross per 24 hour  Intake           775.75 ml  Output  0 ml  Net           775.75 ml     Filed Weights   11/01/16 1627  Weight: 67.6 kg (149 lb)    Exam:    Constitutional:   Appears chronically ill, appears to be in pain, nontoxic. Respiratory:   Clear to  auscultation bilaterally. No wheezes, rales or rhonchi. Normal respiratory effort. Cardiovascular:   Regular rate and rhythm. No murmur, rub or gallop. No lower extremity edema. Abdomen:   Obese soft, mild to moderate generalized tenderness Musculoskeletal:   Moves bilateral lower extremities to command Psychiatric:   Alert, speech fluent and clear, follows simple commands, appears to be in pain   I have personally reviewed following labs and imaging studies:  Fever noted  Urine culture insignificant growth. Blood cultures pending, no growth thus far.  Blood sugars stable, less than 200 nearly 24 hours  Hemoglobin A1c 10.3  Platelets down to 93, hemoglobin 11.2, stable. Leukocytosis has resolved.   Chest x-ray reviewed. Bilateral interstitial prominence, secondary to tumor spread.  Scheduled Meds: . docusate sodium  100 mg Oral Daily  . feeding supplement (GLUCERNA SHAKE)  237 mL Oral TID BM  . fluconazole  100 mg Oral Daily  . insulin aspart  0-9 Units Subcutaneous TID WC  . letrozole  2.5 mg Oral Daily  . methadone  5 mg Oral Q8H  . mirtazapine  30 mg Oral QHS  . pantoprazole  40 mg Oral Daily   Continuous Infusions: . sodium chloride 75 mL/hr at 11/03/16 0759    Active Problems:   Metastatic breast cancer ([place])   Abdominal pain   LOS: 2 days

## 2016-11-04 LAB — CULTURE, BLOOD (ROUTINE X 2)
Culture: NO GROWTH
Culture: NO GROWTH

## 2016-11-04 LAB — BLOOD CULTURE ID PANEL (REFLEXED)
Acinetobacter baumannii: NOT DETECTED
CANDIDA TROPICALIS: NOT DETECTED
Candida albicans: NOT DETECTED
Candida glabrata: NOT DETECTED
Candida krusei: NOT DETECTED
Candida parapsilosis: NOT DETECTED
Enterobacter cloacae complex: NOT DETECTED
Enterobacteriaceae species: NOT DETECTED
Enterococcus species: NOT DETECTED
Escherichia coli: NOT DETECTED
HAEMOPHILUS INFLUENZAE: NOT DETECTED
KLEBSIELLA OXYTOCA: NOT DETECTED
KLEBSIELLA PNEUMONIAE: NOT DETECTED
Listeria monocytogenes: NOT DETECTED
Neisseria meningitidis: NOT DETECTED
Proteus species: NOT DETECTED
Pseudomonas aeruginosa: NOT DETECTED
SERRATIA MARCESCENS: NOT DETECTED
STAPHYLOCOCCUS AUREUS BCID: NOT DETECTED
Staphylococcus species: NOT DETECTED
Streptococcus agalactiae: NOT DETECTED
Streptococcus pneumoniae: DETECTED — AB
Streptococcus pyogenes: NOT DETECTED
Streptococcus species: DETECTED — AB

## 2016-11-06 LAB — CULTURE, BLOOD (ROUTINE X 2)

## 2016-11-08 LAB — CULTURE, BLOOD (ROUTINE X 2): Culture: NO GROWTH

## 2016-11-24 ENCOUNTER — Encounter: Payer: Self-pay | Admitting: Oncology

## 2016-11-24 ENCOUNTER — Ambulatory Visit: Payer: Self-pay | Admitting: Oncology

## 2016-11-24 NOTE — Progress Notes (Deleted)
[place]  [phone] [phone]     ID: [name] DOB: 28-Mar-1966  MR#: [id number]  FVC#:[id number]  Patient Care Team: [name], MD as PCP - General (Family Medicine) Stark Klein, MD as Consulting Physician (General Surgery) Chauncey Cruel, MD as Consulting Physician (Oncology) Thea Silversmith, MD as Consulting Physician (Radiation Oncology) [facility] Bunnie Pion, NP as Nurse Practitioner (Nurse Practitioner) Kyung Rudd, MD as Consulting Physician (Radiation Oncology)  CHIEF COMPLAINT: Stage III left breast cancer  CURRENT TREATMENT: [facility]; out of facility DNR in place  INTERVAL HISTORY: Ady returns today for follow-up of her stage IV breast cancer. She continues under the care of hospice of [place]. At the last visit we found out that when she was discharged from the hospital they named Dr. Noah Delaine as her hospice designated physician. I discussed this with the hospice medical director and explained to Timesha that if she wanted to assign  me as her hospice physician I would be glad to write her pain nausea and other medicines but right now she had to work through the doctor that she is assigned within that is Dr. Noah Delaine. She seemed to understand that and I thought the problem would have been taking care of by now.  In fact Mayrani tells me that the hospice nurses come by various times "with papers for me to sign". Tye [place] has not signed them because she somehow suspects this may have something to do with taking her money or taking her house. She is not really clear what it is all about. The result is that we are on the same situation as we were before.  REVIEW OF SYSTEMS:  Iana continues on oxygen 24 7. She says she has no pain medicine and no nausea medicine. She told me her blood sugar was almost 400 yesterday. I asked her if she was still on steroids and she did not know. I suggest that she really needs to be off steroids  because this is throwing her sugar off but she tells me she has no medicine of any kind and that she needs all her medicines refilled today. Unfortunately no family member came with her today. We will attempt to contact the hospice nurse today to see if we can update this.   BREAST CANCER HISTORY: From the original intake note:  The patient herself palpated a mass in her left breast and brought it to the attention of [facility] NP 04/22/2014. She palpated a large nontender mass at the 4:00 position as well as a small tender mass in the 6:00 position of the left breast. The patient was set up for imaging at the breast Center where on 05/05/2014 she underwent bilateral diagnostic mammography and left breast ultrasonography. The mammogram showed an irregular mass in the outer left breast with no other suspicious findings. This was firm and fixed at the 3:00 position approximately 10 cm from the nipple. Ultrasound showed a hypoechoic irregular solid mass in this area, measuring 1.8 cm. Ultrasound of the left axilla showed 2 suspicious level I lymph nodes, the largest measuring 1 cm, with a thickened pole. A smaller lymph node measured 0.6 cm but had no visible fatty hilum.  On 05/09/2014 the patient underwent biopsy of the left breast mass (she refused biopsy of the left axilla) which showed ([id number]) and invasive ductal carcinoma, grade 2, estrogen receptor positive at 100%, with strong staining intensity, progesterone receptor 80% positive, with weak staining intensity, with an MIB-1  of 80%, and no HER-2 amplification.  On 05/16/2014 the patient underwent bilateral breast MRI. This showed a 1.7 cm irregular enhancing mass at the 3:00 position of the left breast associated with the biopsy cleft. There were 3 level I left axillary lymph nodes with thickened cortices. The largest measured 1.3 cm. There were no other findings of concern.  Her treatment for stage III disease is detailed below.  METASTATIC  DISEASE:  Merve presented to the emergency room 06/06/2016 complaining of shortness of breath and pain after an episode of syncope. CT angiogram of the chest raises suspicion for possible edema, infection, or carcinomatosis. There were also numerous lytic lung lesions. Right pleural fluid cytology collected 06/08/2016 showed ([id number]) adenocarcinoma, estrogen receptor 90% positive, progesterone receptor negative, HER-2 not amplified with a signals ratio of 1.09 and number per cell 1.85. Brain MRI was obtained 06/09/2016 and showed between 15 and 20 enhancing lesions consistent with metastatic disease, chiefly involving the cerebellum.  Her subsequent history is detailed below   PAST MEDICAL HISTORY: Past Medical History:  Diagnosis Date  . Anemia    low iron  . Arthritis   . Depression    pt denies   . Family history of malignant neoplasm of breast   . Head ache   . Hot flashes   . Lymphedema of breast    left  . Malignant neoplasm of breast (female), unspecified site    left breast  . Mental disorder    depression-  . Newly diagnosed diabetes 11/12/2014   chemo increasing blood glucose; not on meds now  . Radiation 04/22/15-06/23/15   Left Breast    PAST SURGICAL HISTORY: Past Surgical History:  Procedure Laterality Date  . BREAST LUMPECTOMY WITH SENTINEL LYMPH NODE BIOPSY Left 06/11/2014   Procedure: BREAST LUMPECTOMY WITH SENTINEL LYMPH NODE BX, POSSIBLE AXILLARY LYMPH NODE DISSECTION;  Surgeon: Stark Klein, MD;  Location: [place];  Service: General;  Laterality: Left;  . EVACUATION BREAST HEMATOMA Left 07/10/2014   Procedure: EVACUATION OF LEFT BREAST HEMATOMA ;  Surgeon: Stark Klein, MD;  Location: [place];  Service: General;  Laterality: Left;  . FOOT SURGERY  2011   rt  . INCISION AND DRAINAGE Left 07/02/2014   Procedure: Drainage left breast seroma;  Surgeon: Stark Klein, MD;  Location: [facility];  Service: General;  Laterality: Left;  .  INCISION AND DRAINAGE ABSCESS N/A 11/12/2014   Procedure: ASPIRATION AND INCISION AND DRAINAGE LEFT BREAST ABSCESS;  Surgeon: Michael Boston, MD;  Location: [facility];  Service: General;  Laterality: N/A;  . [place]   right  . PORT-A-CATH REMOVAL Left 07/22/2015   Procedure: REMOVAL PORT-A-CATH;  Surgeon: Stark Klein, MD;  Location: [place];  Service: General;  Laterality: Left;  . PORTACATH PLACEMENT N/A 07/02/2014   Procedure: INSERTION PORT-A-CATH;  Surgeon: Stark Klein, MD;  Location: [place];  Service: General;  Laterality: N/A;  . TUBAL LIGATION    . UTERINE FIBROID SURGERY  2005   ablation    FAMILY HISTORY Family History  Problem Relation Age of Onset  . Breast cancer Mother 50    currently 88; TAH/BSO d/t ?cervical ca at 14  . Lung cancer Father     deceased 35  . Breast cancer Maternal Aunt     dx 58s; currently in her late 52s  . Diabetes Neg Hx    the patient's father died at the age of 73 from lung  cancer. The patient's mother was diagnosed with breast cancer at the age of 64. She survives. The patient had 2 brothers, and 2 sisters. One brother may have a diagnosis of "stomach cancer"  GYNECOLOGIC HISTORY:  No LMP recorded. Patient has had an ablation. Menarche age 51 which is also when the patient first had a child. She is GX P3. She stopped having periods after laser ablation in 2005.  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 She has lived with her daughter [name] and her 4 children who are 68, 8, 5, and 4, and [name]'s significant other. 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 Doris's son Gilford Raid works as a Dietitian in [place]. The patient's daughter [name] works as a Scientist, water quality, also in [place]. The patient has 7 grandchildren. She is not a church attender    ADVANCED DIRECTIVES: she has named her daughter Jadene Pierini. [place] as her healthcare  power of attorney; she is under the care of hospice and has an out of facility DO NOT RESUSCITATE order in place    HEALTH MAINTENANCE: Social History  Substance Use Topics  . Smoking status: Former Smoker    Packs/day: 0.25    Years: 20.00    Types: Cigarettes  . Smokeless tobacco: Never Used  . Alcohol use No     Comment: Percoset,Vicodin, Oxycotin      Allergies  Allergen Reactions  . Hydrocodone Itching and Nausea Only    Current Outpatient Prescriptions  Medication Sig Dispense Refill  . ALPRAZolam (XANAX) 1 MG tablet Take 1 mg by mouth 3 (three) times daily as needed for sleep or anxiety.  5  . benzonatate (TESSALON) 100 MG capsule Take 1 capsule (100 mg total) by mouth 3 (three) times daily as needed for cough. 20 capsule 0  . docusate sodium (COLACE) 100 MG capsule Take 1 capsule (100 mg total) by mouth daily. 10 capsule 0  . feeding supplement, GLUCERNA SHAKE, (GLUCERNA SHAKE) LIQD Take 237 mLs by mouth 3 (three) times daily between meals. 90 Can 0  . fluconazole (DIFLUCAN) 100 MG tablet Take 1 tablet (100 mg total) by mouth daily. 30 tablet 3  . glipiZIDE (GLUCOTROL XL) 5 MG 24 hr tablet Take 2 tablets (10 mg total) by mouth daily with breakfast. 120 tablet 12  . HYDROmorphone (DILAUDID) 2 MG tablet Take 0.5 tablets (1 mg total) by mouth every 4 (four) hours as needed. 15 tablet 0  . ipratropium-albuterol (DUONEB) 0.5-2.5 (3) MG/3ML SOLN Inhale 3 mLs into the lungs 3 (three) times daily as needed for shortness of breath or wheezing.  0  . letrozole (FEMARA) 2.5 MG tablet Take 2.5 mg by mouth daily.  0  . methadone (DOLOPHINE) 5 MG tablet Take 1 tablet (5 mg total) by mouth every 8 (eight) hours. (Patient not taking: Reported on 10/30/2016) 90 tablet 0  . mirtazapine (REMERON) 30 MG tablet Take 1 tablet (30 mg total) by mouth at bedtime. 30 tablet 3  . ondansetron (ZOFRAN ODT) 4 MG disintegrating tablet Take 1 tablet (4 mg total) by mouth every 8 (eight) hours as needed for  nausea. 10 tablet 0  . pantoprazole (PROTONIX) 20 MG tablet Take 2 tablets (40 mg total) by mouth daily. (Patient not taking: Reported on 11/01/2016) 60 tablet 0  . PROAIR HFA 108 (90 Base) MCG/ACT inhaler Inhale 2 puffs into the lungs every 6 (six) hours as needed for shortness of breath or  wheezing.  0  . prochlorperazine (COMPAZINE) 10 MG tablet Take 1 tablet (10 mg total) by mouth every 6 (six) hours as needed for nausea or vomiting. 30 tablet 0   No current facility-administered medications for this visit.     OBJECTIVE: Middle-aged [place] American woman Wearing oxygen by nasal cannula There were no vitals filed for this visit.   There is no height or weight on file to calculate BMI.    ECOG FS:2 - Symptomatic, <50% confined to bed  Sclerae unicteric, EOMs intact Oropharynx clear and moist No cervical or supraclavicular adenopathy Lungs no rales or rhonchi Heart regular rate and rhythm Abd soft, nontender, positive bowel sounds MSK no focal spinal tenderness, no upper extremity lymphedema Neuro: nonfocal, well oriented, appropriate affect Breasts: The right breast is unremarkable. The left breast is dusky and firmer than the right breast with some dimpling superiorly. Both axillae are benign.    LAB RESULTS:  CMP     Component Value Date/Time   NA 136 11/02/2016 0512   NA 134 (L) 08/12/2016 1231   K 4.2 11/02/2016 0512   K 3.5 08/12/2016 1231   CL 103 11/02/2016 0512   CO2 24 11/02/2016 0512   CO2 20 (L) 08/12/2016 1231   GLUCOSE 100 (H) 11/02/2016 0512   GLUCOSE 409 (H) 08/12/2016 1231   BUN 18 11/02/2016 0512   BUN 17.7 08/12/2016 1231   CREATININE 0.47 11/02/2016 0512   CREATININE 0.8 08/12/2016 1231   CALCIUM 8.4 (L) 11/02/2016 0512   CALCIUM 9.2 08/12/2016 1231   PROT 5.8 (L) 11/02/2016 0512   PROT 6.6 08/12/2016 1231   ALBUMIN 3.4 (L) 11/02/2016 0512   ALBUMIN 3.1 (L) 08/12/2016 1231   AST 57 (H) 11/02/2016 0512   AST 14 08/12/2016 1231   ALT 45 11/02/2016  0512   ALT 35 08/12/2016 1231   ALKPHOS 192 (H) 11/02/2016 0512   ALKPHOS 104 08/12/2016 1231   BILITOT 0.3 11/02/2016 0512   BILITOT 0.25 08/12/2016 1231   GFRNONAA >60 11/02/2016 0512   GFRAA >60 11/02/2016 0512    I No results found for: SPEP  Lab Results  Component Value Date   WBC 7.7 11/03/2016   NEUTROABS 6.5 11/03/2016   HGB 11.2 (L) 11/03/2016   HCT 33.3 (L) 11/03/2016   MCV 89.3 11/03/2016   PLT 93 (L) 11/03/2016      Chemistry      Component Value Date/Time   NA 136 11/02/2016 0512   NA 134 (L) 08/12/2016 1231   K 4.2 11/02/2016 0512   K 3.5 08/12/2016 1231   CL 103 11/02/2016 0512   CO2 24 11/02/2016 0512   CO2 20 (L) 08/12/2016 1231   BUN 18 11/02/2016 0512   BUN 17.7 08/12/2016 1231   CREATININE 0.47 11/02/2016 0512   CREATININE 0.8 08/12/2016 1231      Component Value Date/Time   CALCIUM 8.4 (L) 11/02/2016 0512   CALCIUM 9.2 08/12/2016 1231   ALKPHOS 192 (H) 11/02/2016 0512   ALKPHOS 104 08/12/2016 1231   AST 57 (H) 11/02/2016 0512   AST 14 08/12/2016 1231   ALT 45 11/02/2016 0512   ALT 35 08/12/2016 1231   BILITOT 0.3 11/02/2016 0512   BILITOT 0.25 08/12/2016 1231       Lab Results  Component Value Date   LABCA2 1,356.9 (H) 06/08/2016    No components found for: VELFY101  No results for input(s): INR in the last 168 hours.  Urinalysis    Component  Value Date/Time   COLORURINE YELLOW 10/30/2016 [place] 10/30/2016 1424   LABSPEC >1.046 (H) 10/30/2016 1424   PHURINE 7.0 10/30/2016 1424   GLUCOSEU 50 (A) 10/30/2016 1424   HGBUR NEGATIVE 10/30/2016 1424   BILIRUBINUR NEGATIVE 10/30/2016 1424   KETONESUR NEGATIVE 10/30/2016 1424   PROTEINUR NEGATIVE 10/30/2016 1424   UROBILINOGEN 0.2 06/03/2014 1050   NITRITE NEGATIVE 10/30/2016 1424   LEUKOCYTESUR TRACE (A) 10/30/2016 1424    STUDIES: Dg Chest 2 View  Result Date: 10/30/2016 CLINICAL DATA:  Pt c/o diffuse abd pain w/ hememesis x 2 days, hx metastatic  breast ca w/ multiple rounds of chemo and 2 x rad over past two years. EXAM: CHEST  2 VIEW COMPARISON:  Chest CT and chest radiographs, 07/31/2016 FINDINGS: Cardiac silhouette is normal in size. No mediastinal or hilar masses. No evidence of adenopathy. There are irregular interstitial with mild hazy airspace opacities in the lower lungs, similar to the appearance on the prior chest radiograph. No pleural effusion. No pneumothorax. There are stable changes from left breast surgery. Skeletal structures are unremarkable. IMPRESSION: 1. Interstitial and mild hazy airspace opacities in the lower lungs. These findings may be chronic, being similar in appearance to the prior chest radiograph. Acute interstitial infection or inflammation should be considered if there are consistent clinical symptoms. 2. No convincing pulmonary edema. 3. Stable changes from left breast surgery. Electronically Signed   By: Lajean Manes M.D.   On: 10/30/2016 12:33   Ct Abdomen Pelvis W Contrast  Result Date: 10/30/2016 CLINICAL DATA:  Pt c/o diffuse generalized abdominal pain, abdominal swelling, nausea, and emesis with streaks of red blood x 5 days. No diarrhea or blood in stool. Hx breast cancer. Chemo pill only. No IV chemo or radiation currently.Pt adds chest pain and tightness x 5 days. EXAM: CT ABDOMEN AND PELVIS WITH CONTRAST TECHNIQUE: Multidetector CT imaging of the abdomen and pelvis was performed using the standard protocol following bolus administration of intravenous contrast. CONTRAST:  173m ISOVUE-300 IOPAMIDOL (ISOVUE-300) INJECTION 61% COMPARISON:  06/03/2014 FINDINGS: Lower chest: Moderate pleural effusions, slightly greater on the right. Lung base interstitial thickening. Heart normal in size. Trace pericardial fluid Hepatobiliary: Large irregularly enhancing mass in the liver is centered on the medial 7 0 left lobe crossing to the anterior segment of the right lobe. It measures 13 x 4 x 10 cm. Enhancement is patchy  nodular on the initial post-contrast sequence and feels in most of the lesion on the delayed sequence. On the delayed sequence, there are wedge-shaped areas of relative increased enhancement along the peripheral right lobe. There is also relative increased attenuation/enhancement adjacent to the falciform ligament. The overall liver attenuation is decrease consistent with fatty infiltration. Liver is mildly enlarged. Gallbladder is unremarkable.  No bile duct dilation. Pancreas: Unremarkable. No pancreatic ductal dilatation or surrounding inflammatory changes. Spleen: Normal in size without focal abnormality. Adrenals/Urinary Tract: Adrenal glands are unremarkable. Kidneys are normal, without renal calculi, focal lesion, or hydronephrosis. Bladder is unremarkable. Stomach/Bowel: Stomach is within normal limits. Appendix appears normal. No evidence of bowel wall thickening, distention, or inflammatory changes. Vascular/Lymphatic: Aortic atherosclerosis. No enlarged abdominal or pelvic lymph nodes. Reproductive: Uterus and bilateral adnexa are unremarkable. Other: No abdominal wall hernia or abnormality. No abdominopelvic ascites. Visualize left breast shows skin thickening consistent with the provided history of breast carcinoma. This Musculoskeletal: Multiple small lucent and multiple sclerotic bone lesions seen throughout the visualized spine and in the pelvis consistent with metastatic disease. Schmorl's nodes  noted along the upper endplates of L2 and L4. IMPRESSION: 1. Findings all are consistent with metastatic disease. Given the history of breast carcinoma, this is likely the primary. 2. There is a large heterogeneously enhancing mass in the liver, which was not present on the prior CT. It measures 13 cm in greatest dimension. No other discrete liver masses. 3. There are multiple sclerotic and lucent bone lesions consistent with widespread metastatic disease to bone. 4. Moderate pleural effusions are noted with  lung base interstitial thickening. Interstitial thickening is likely due to edema. No discrete liver base mass or nodule. 5. There is also mild hepatomegaly and diffuse hepatic steatosis. 6. No acute abnormalities in the abdomen or pelvis. Electronically Signed   By: Lajean Manes M.D.   On: 10/30/2016 13:19   Dg Chest Port 1 View  Result Date: 11/03/2016 CLINICAL DATA:  Breast cancer.  Fever. EXAM: PORTABLE CHEST 1 VIEW COMPARISON:  10/30/2016 . FINDINGS: Mediastinum hilar structures stable. Heart size stable. Diffuse bilateral pulmonary interstitial prominence again noted. Findings could be related to pneumonitis, interstitial edema, and/or interstitial tumor spread. Rounded densities noted over the upper lungs. This may represent components of the interstitial changes. Metastatic disease cannot be excluded. Small bilateral pleural effusions. No pneumothorax. No acute bony abnormality . Surgical clips noted over the left axilla. IMPRESSION: 1. Bilateral interstitial prominence again noted. Findings could be related to pneumonitis, interstitial edema, and /or interstitial tumor spread. Associated small bilateral pleural effusions noted . 2. Rounded densities noted over both upper lungs. These could represent a confluence of the interstitial changes, metastatic disease cannot be excluded. Electronically Signed   By: Marcello Moores  Register   On: 11/03/2016 08:33    ASSESSMENT: 51 y.o. [place] woman status post left breast upper outer quadrant biopsy 05/09/2014 for a clinicalT1c N1, stage IIA invasive ductal carcinoma, grade 2, estrogen and progesterone receptor positive, HER-2 nonamplified, with an MIB-1 of 80%  (1) patient met with genetics counselor 05/29/2014 but decided against genetics testing   (2) tobacco abuse. The patient has been strongly urged to discontinue smoking. She was prescribed a nicotine patch 09/02/2014  (3) status post left lumpectomy and axillary lymph node dissection 06/11/2014 for a  pT1c pN3, stage IIIC invasive ductal carcinoma, grade 3, with repeat HER-2 negative  (4) adjuvant chemotherapy consisting of doxorubicin and cyclophosphamide in dose dense fashion x4, completed 09/09/2014, followed by paclitaxel weekly x12 completed 03/09/2015  (5) adjuvant radiation 04/22/15-06/23/15 Left breast/ 45 Gy at 1.8 Gy per fraction x 25 fractions.  Left supraclavicular fossa and axilla/ 45 Gy at 1.8 Gy per fraction x 25 fractions Left breast boost/ 16 Gy at 2 Gy per fraction x 8 fractions  (6) tamoxifen started 07/01/2015, discontinued 06/09/2016 with evidence of disease progression  METASTATIC DISEASE: involving brain, lungs and bone documented August 2017 (7) CT angio 06/06/2016 c/w lymphangitic carcinomatosis and lytic bone lesions                 (a) right thoracentesis 06/08/2016-- cytology confirms adenocarcinoma, estrogen receptor positive                 (b) brain MRI 06/09/2016 shows multiple metastases   (c) CA 27-29 is informative  (8) started letrozole 06/09/2016; palbociclib added 06/24/2016  (9) radiation to CNS and spine in 10 fractions 06/13/2016 through 06/27/2016  (10) poorly controlled pain:   (a) initially treated with oxycontin BID with OxyIR PRN, discontinued 07/25/2016 with the patient unable to keep her pain management agreement (multiple  episodes of loss medications, need for early refills, not calling with medication changes, etc.)  (b) started on methadone 10 mg by mouth 3 times a day as of 07/25/2016--not current  (11) denosumab/Xgeva started 07/15/2016, discontinued as patient opted for comfort care  (12) as of 08/02/2016 patient opted for comfort/palliative care under the care of hospice.   PLAN: [place] was supposed to have changed the hospice attending of record after her last visit here. Apparently the hospice nurse has been trying to facilitate this. Conita has been suspicious that all this paper signing as she put it is for some  alterntative motive so she has not signed. The upshot ist is that Dr. Noah Delaine remains her attending of record as far as hospice is concerned.  I wrote out for Elora what this means. She will need to go through hospice and they will contact Dr. Noah Delaine to get the medications she needs. Until she is signs the papers saying otherwise we are not going to be able to get her the pain medicine she is demanding.  I gave her a written statement from me that states as soon as she names me as the attending of record I would start her on methadone 5 mg 3 times a day, with oxycodone 5 mg as needed for pain. The oxycodone can be substituted for any similar medication that hospice has on their formulary, at the discretion of the hospice director.  Lania was very upset. On the other hand I really don't see any way to move forward here unless she changes attending's. At this point she needs to see Dr. Noah Delaine for her medication needs.  I made her a return appointment here in 4 weeks. I will be glad to see her before then of course if it becomes necessary.  Chauncey Cruel, MD 11/24/16

## 2016-12-01 DEATH — deceased

## 2016-12-15 ENCOUNTER — Telehealth: Payer: Self-pay | Admitting: Oncology

## 2016-12-15 ENCOUNTER — Telehealth: Payer: Self-pay | Admitting: *Deleted

## 2016-12-15 NOTE — Telephone Encounter (Signed)
This office was contacted by family member of pt stating letter was received stating need to reschedule appointment pt missed in January 2018. Family member stated pt passes away.  This RN reviewed chart and noted pt was admitted to the hospital on 10/30/2017 and discharged to [facility]. Noted attending primary was Dr Noah Delaine. EPIC does not show pt as deceased.  This RN contacted [employer] and verified above with confirmation that pt passed on 2016-11-14 at [facility].  Above communicated to MD.

## 2016-12-16 NOTE — Telephone Encounter (Signed)
Patient's sister called to inform us that the patient died 11/15/2016 and I gave the RN the message

## 2017-02-10 ENCOUNTER — Other Ambulatory Visit: Payer: Self-pay | Admitting: Nurse Practitioner

## 2017-04-09 IMAGING — CR DG CHEST 2V
2 series · 2 of 2 positions shown · non-contrast
Comparison: Chest radiograph and CTA of the chest performed
05/19/2016

CLINICAL DATA: Acute onset of shortness of breath. Initial
encounter.

EXAM:
CHEST  2 VIEW

[w chest lat]
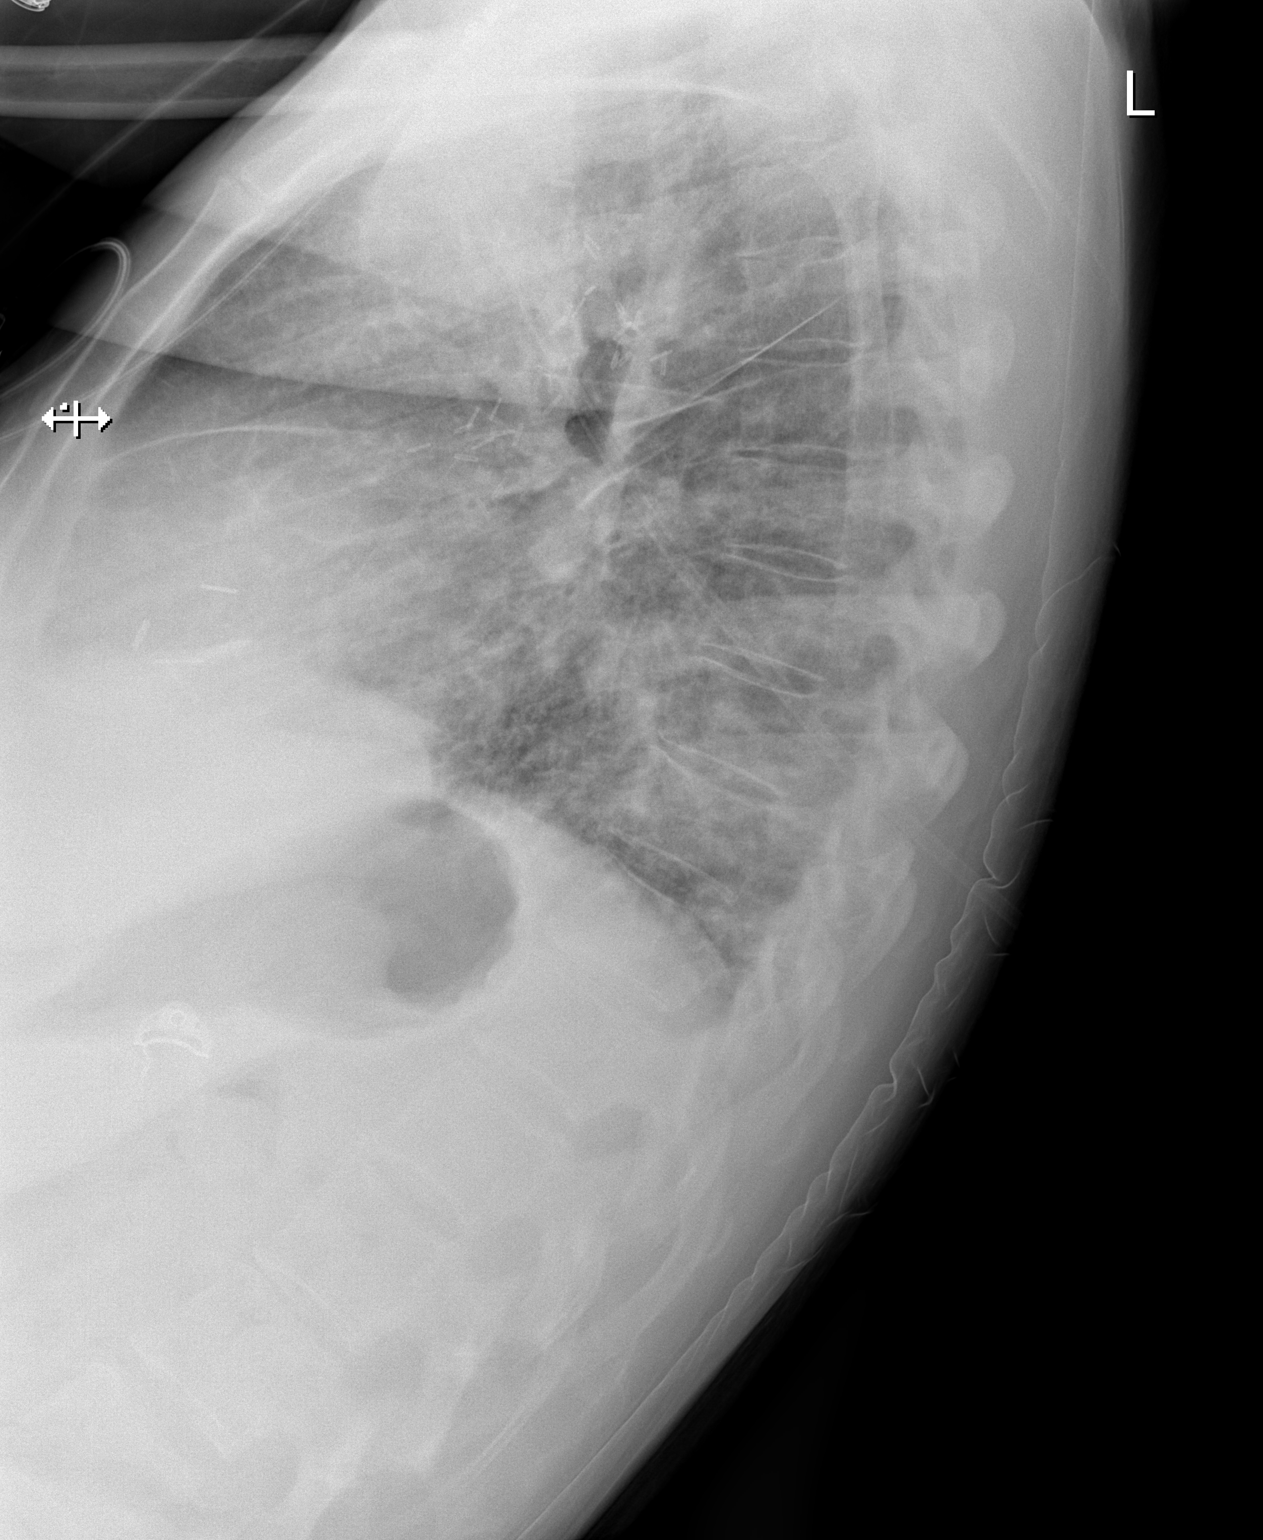

[x chest ap]
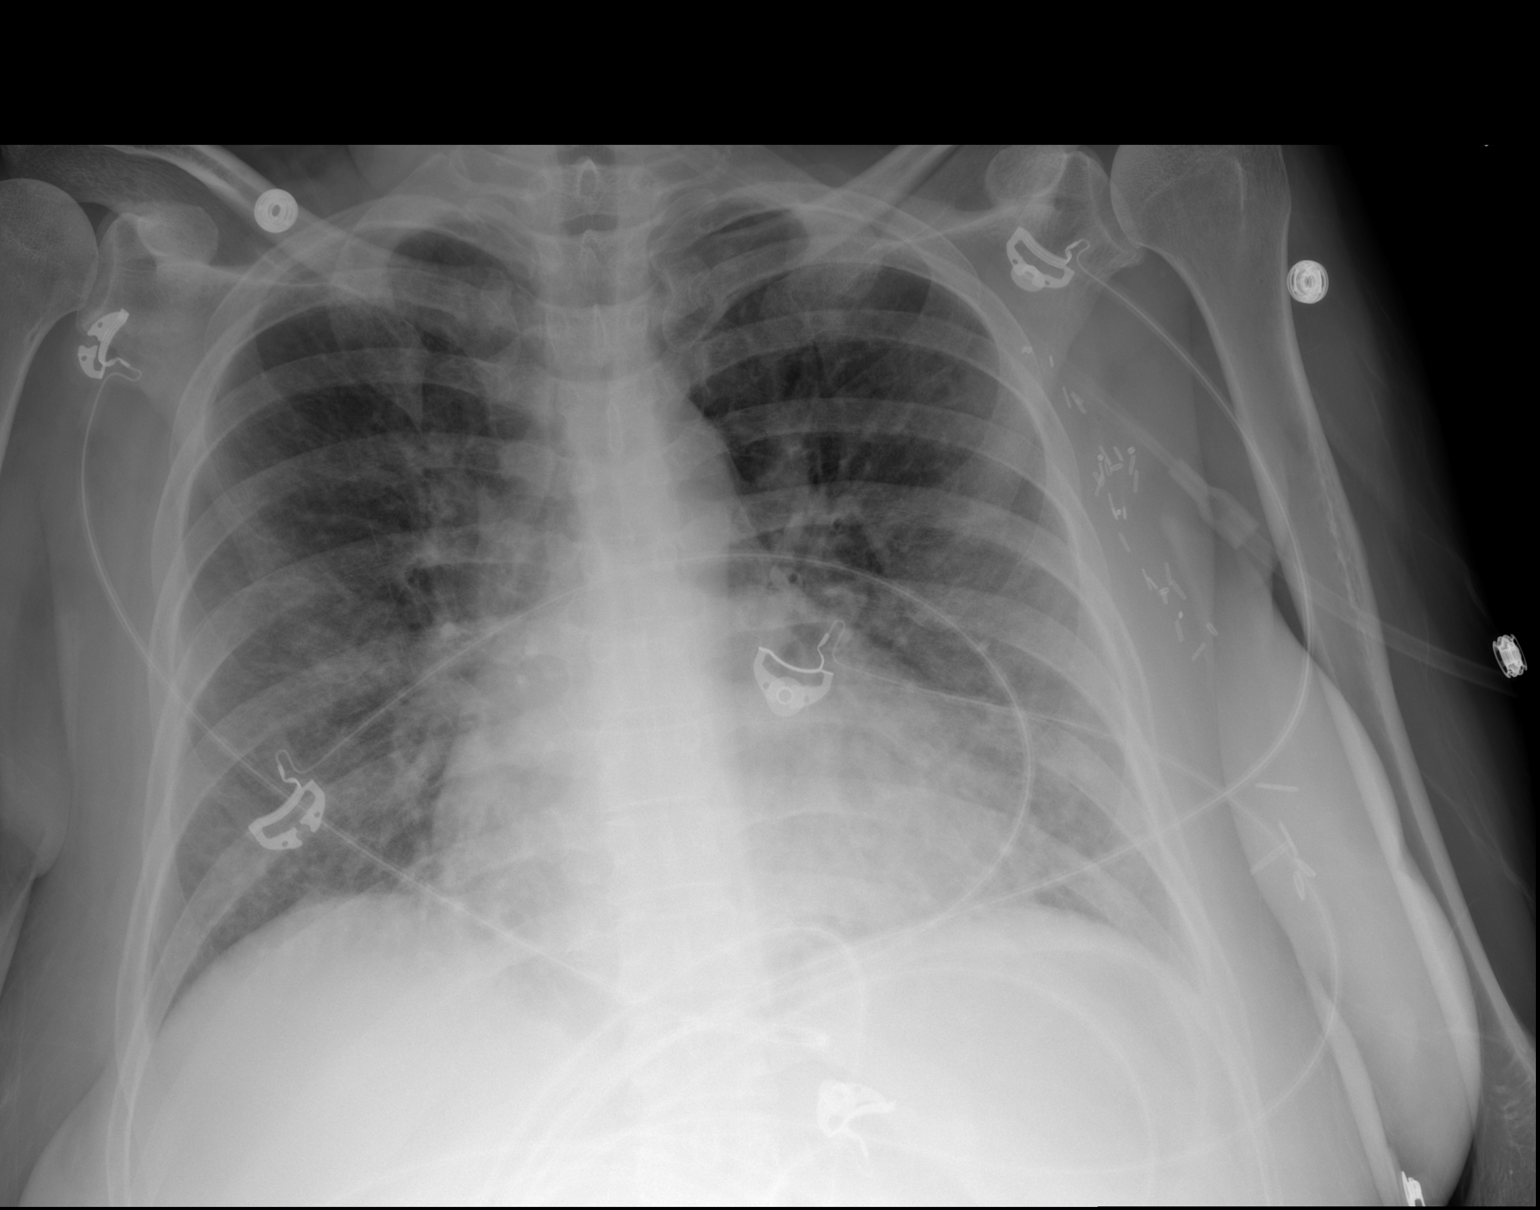

[2 of 2 positions shown; findings below may reference images not displayed]

FINDINGS: The lungs are well-aerated. Vascular congestion is noted. Mild
bibasilar opacities may reflect minimal interstitial edema. There is
no evidence of pleural effusion or pneumothorax.

The heart is mildly enlarged. No acute osseous abnormalities are
seen. Scattered clips are seen at the left axilla.
IMPRESSION: Vascular congestion and mild cardiomegaly. Mild bibasilar opacities
may reflect minimal interstitial edema.

## 2017-04-11 IMAGING — DX DG CHEST 1V PORT
1 series · 1 of 1 positions shown · non-contrast
Comparison: CT 06/07/2016 .  Chest x-ray 06/06/2016.

CLINICAL DATA: Breast cancer.

EXAM:
PORTABLE CHEST 1 VIEW

[chest ap]
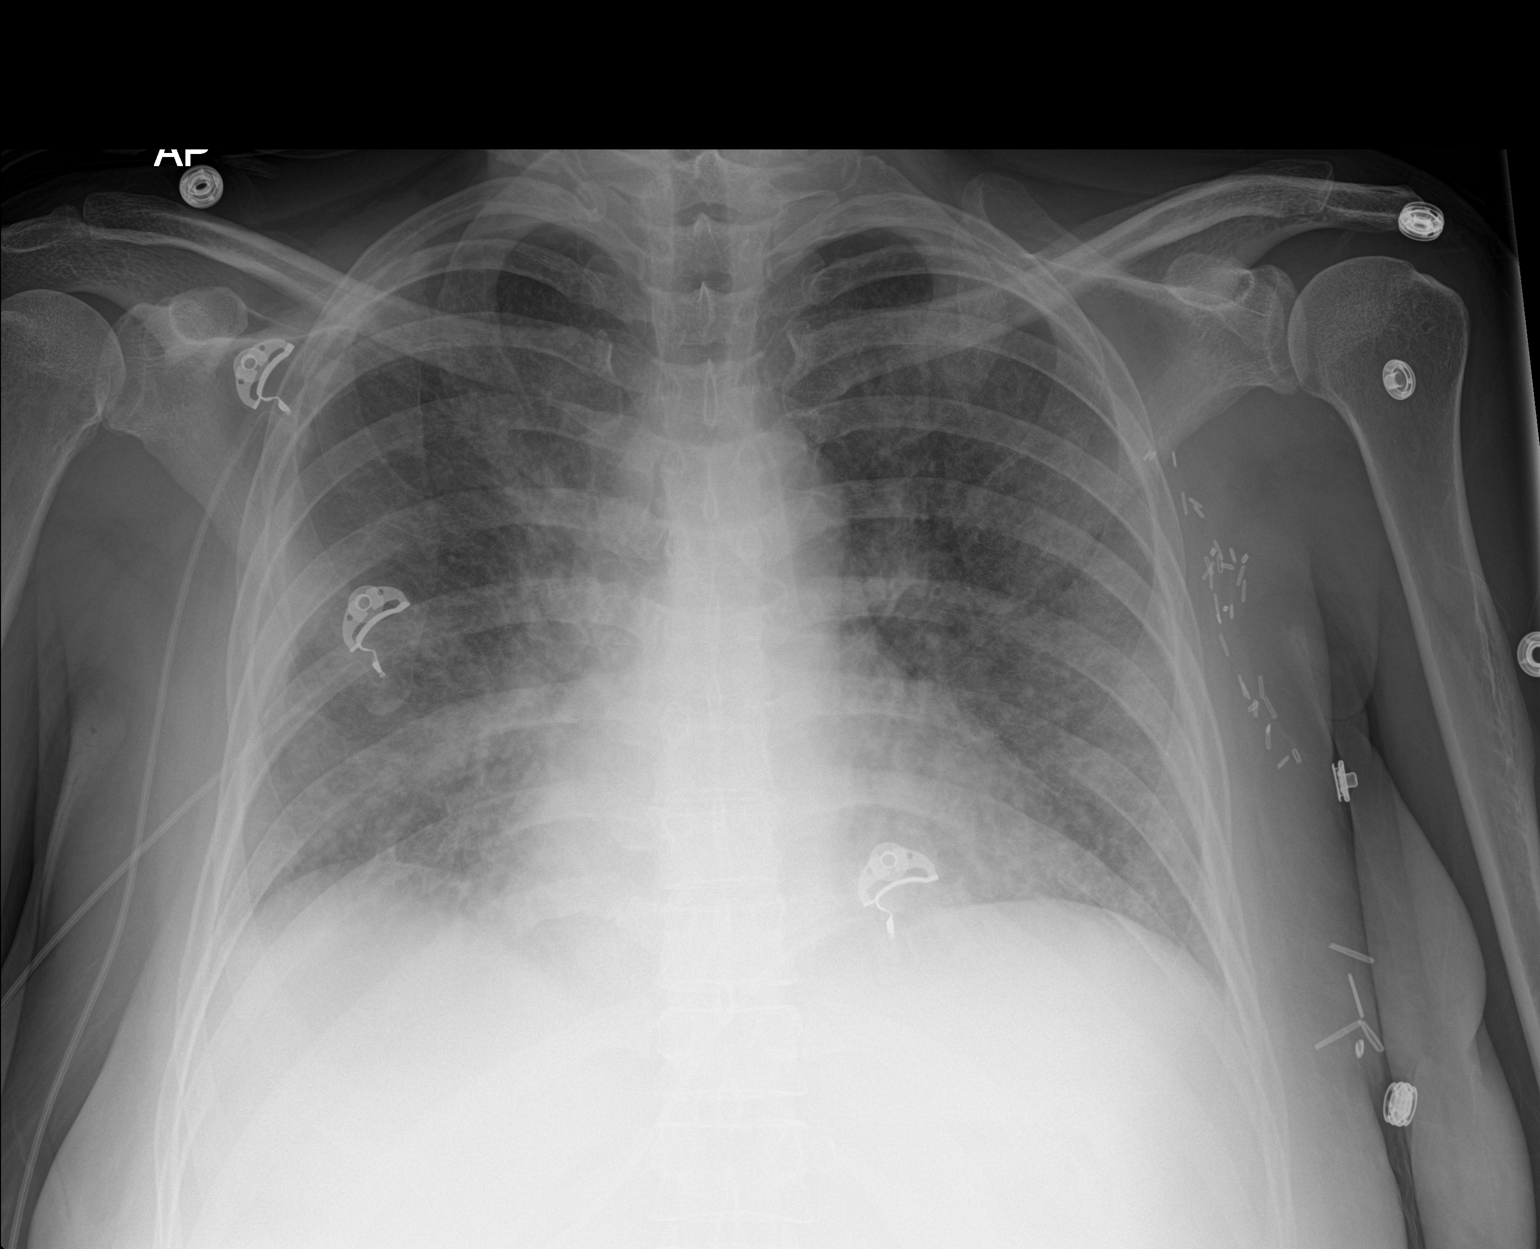

[1 of 1 positions shown; findings below may reference images not displayed]

FINDINGS: Mediastinum stable. Stable cardiomegaly. Persistent diffuse
bilateral pulmonary infiltrates and/or edema. No pleural effusion or
pneumothorax. Surgical clips left chest wall.
IMPRESSION: 1. Persistent bilateral pulmonary infiltrates and or edema. No
significant change.

2.  Stable cardiomegaly.

## 2017-05-12 IMAGING — CR DG CHEST 2V
2 series · 2 of 2 positions shown · non-contrast
Comparison: Chest radiograph dated 06/09/2016

CLINICAL DATA: 50-year-old female with shortness of breath. History
of lung and breast cancers.

EXAM:
CHEST  2 VIEW

[w chest pa]
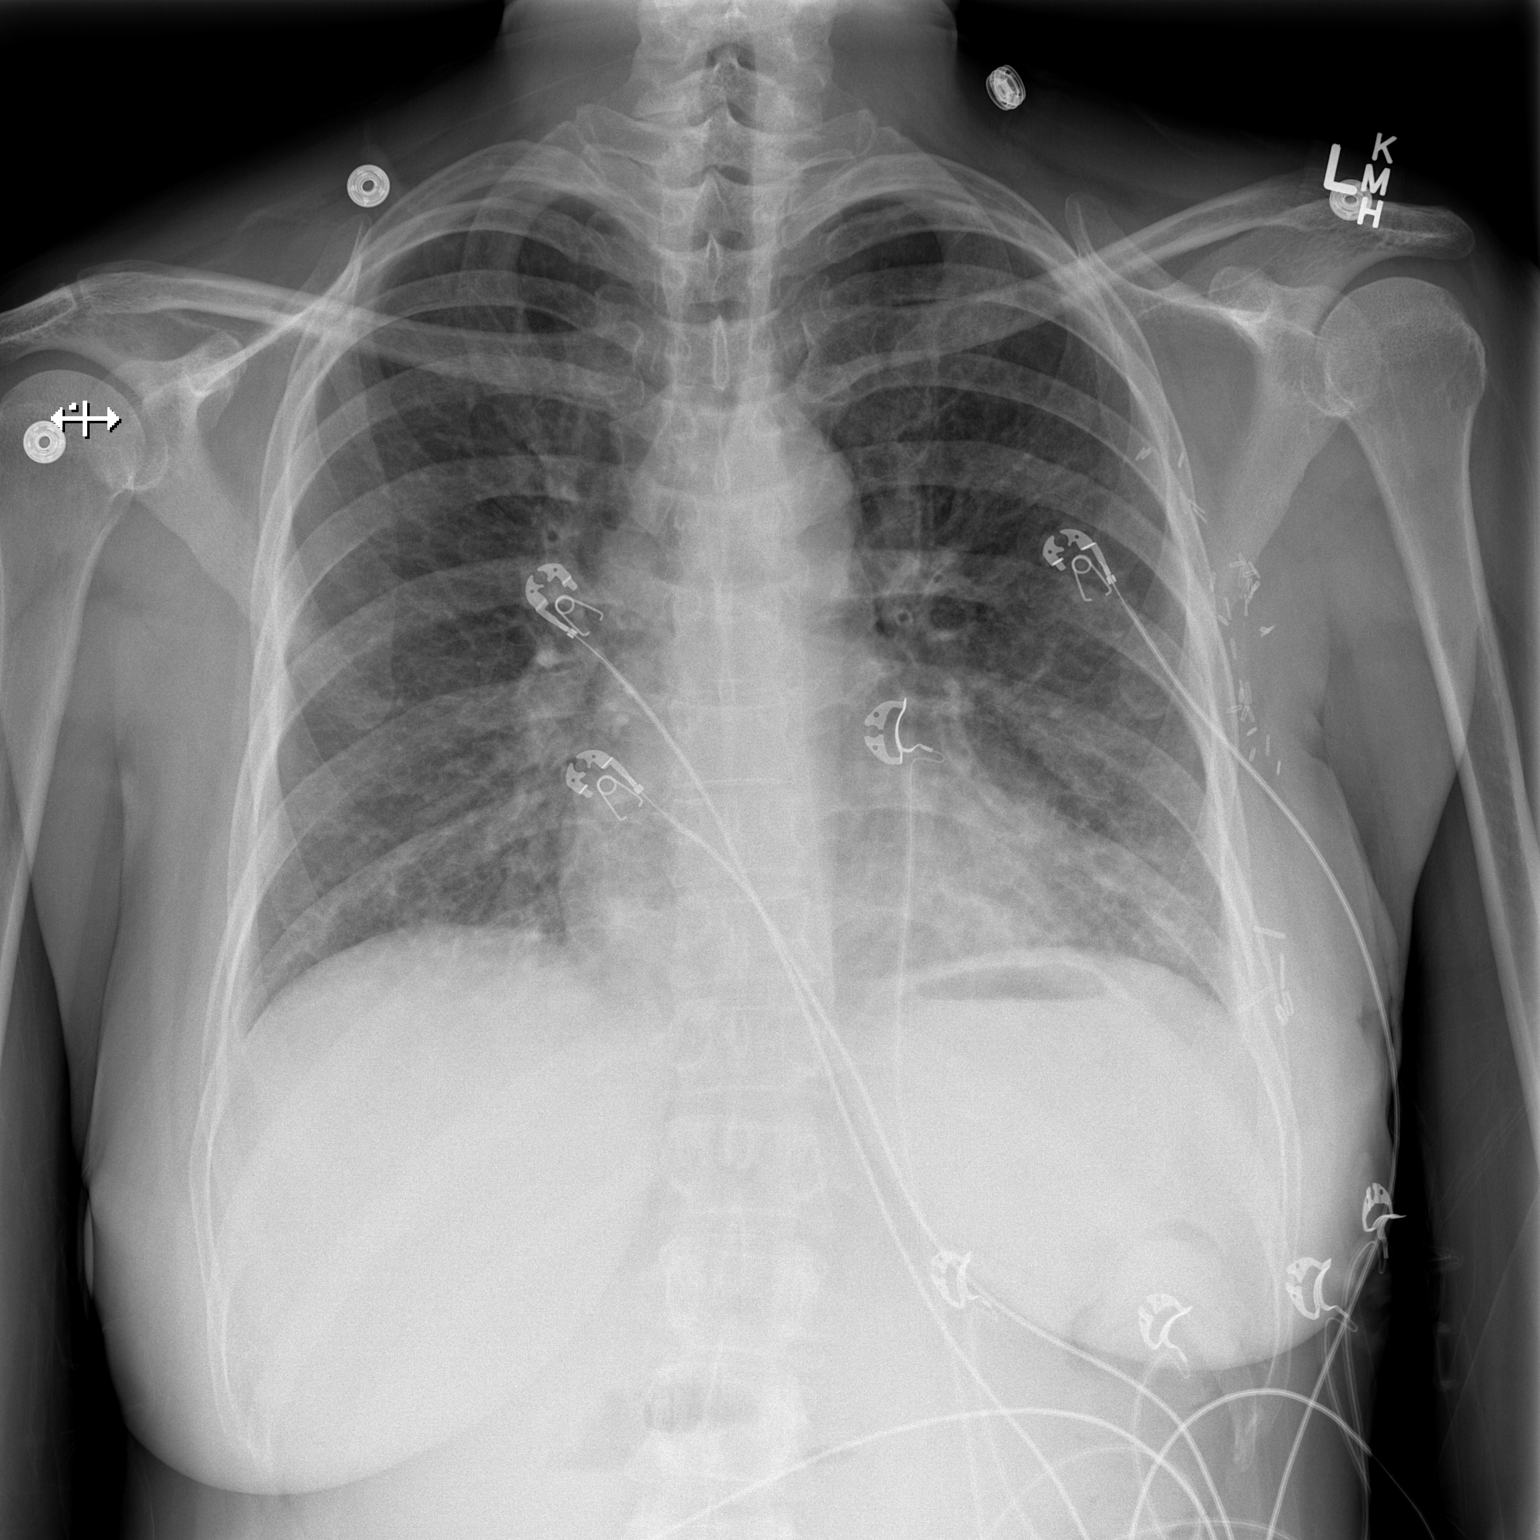

[w chest lat]
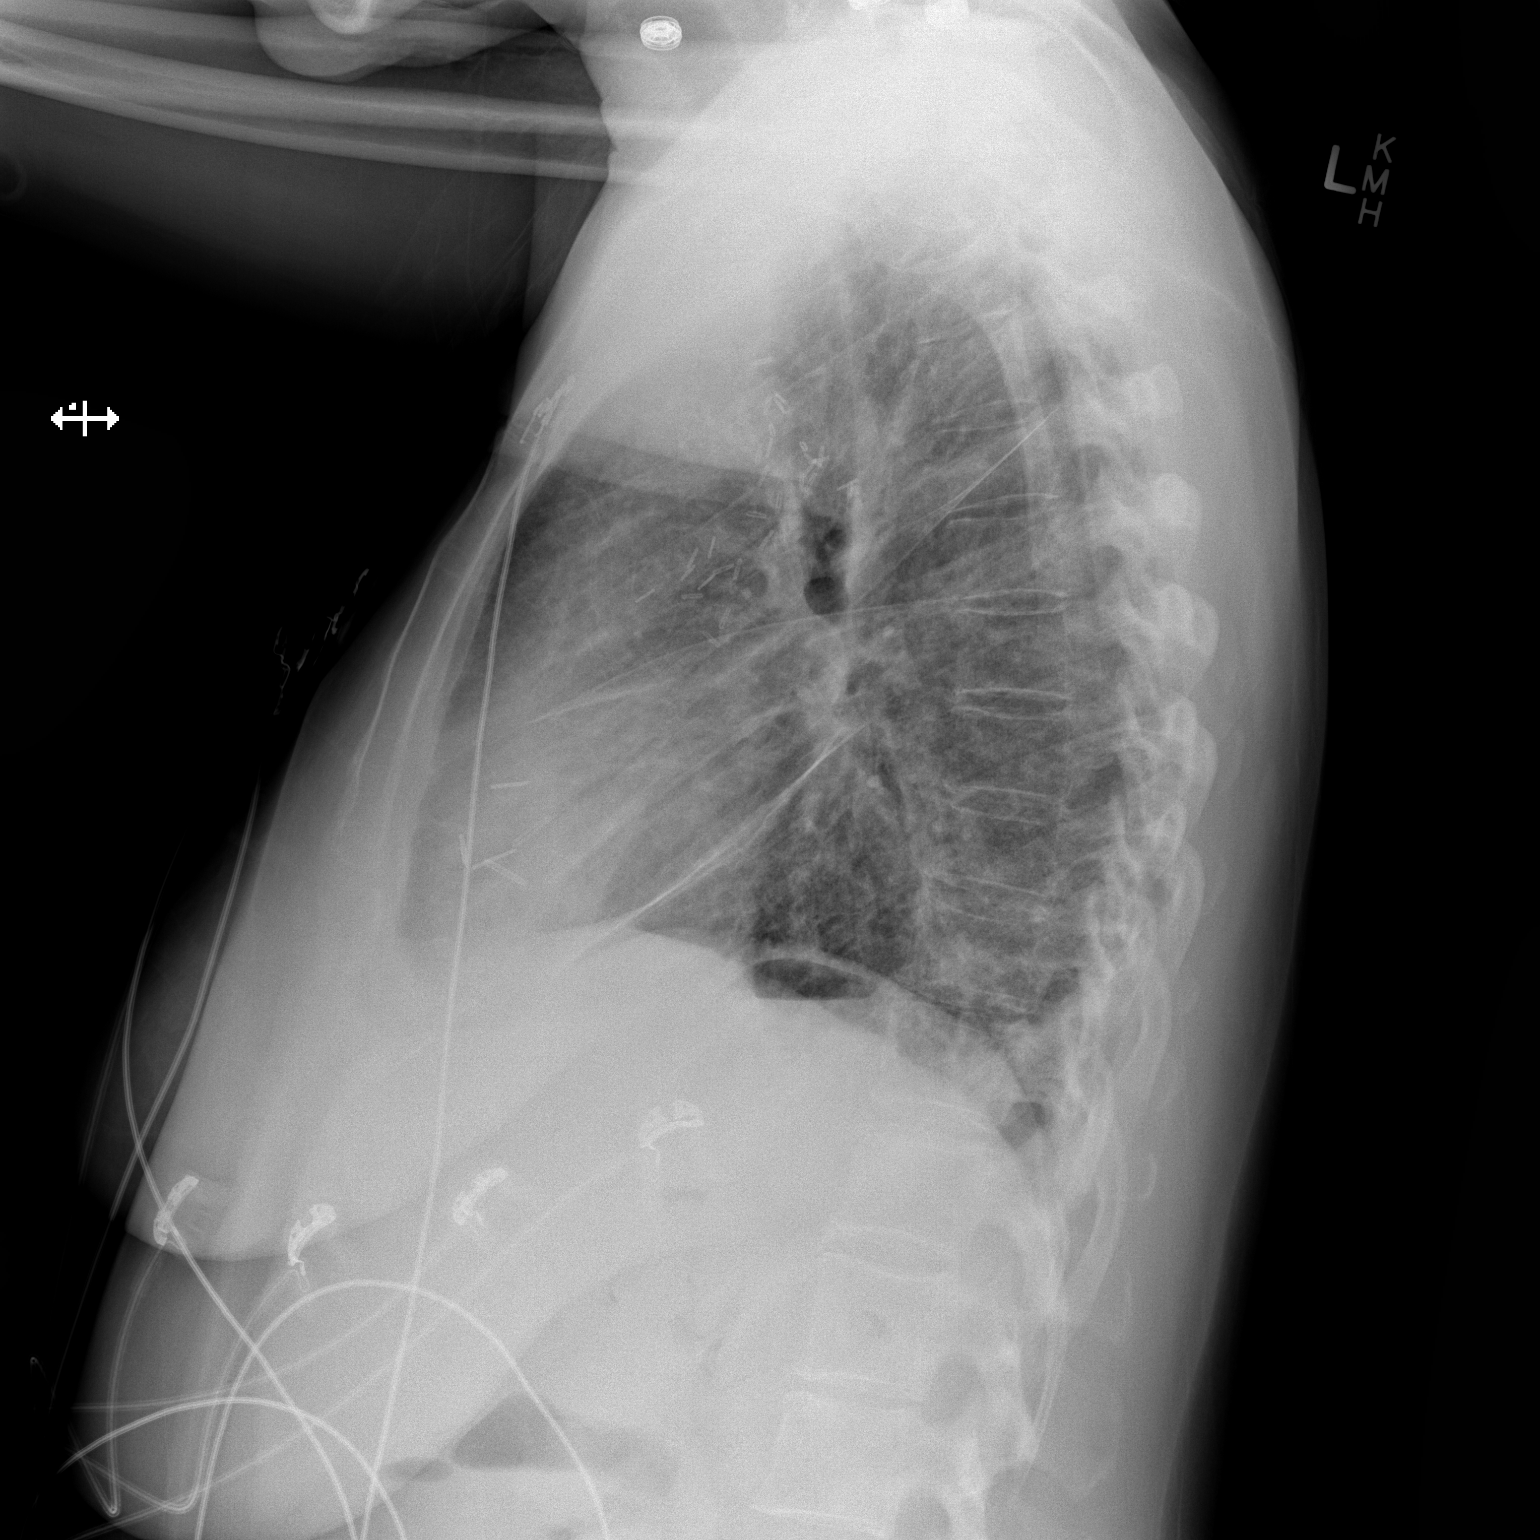

[2 of 2 positions shown; findings below may reference images not displayed]

FINDINGS: Two views of the chest demonstrate stop mild diffuse interstitial
prominence with more nodular appearance in the mid to lower lung
field. There has been interval improved appearance of the lung
markings compared to the prior study. There is no focal
consolidation, or pneumothorax. Probable trace bilateral pleural
effusions. The cardiac silhouette is within normal limits. Left
axillary surgical clips noted. No acute osseous pathology.
IMPRESSION: Mild interstitial prominence and nodularity primarily involving the
mid to lower lung field. Although findings may be related to mild
vascular congestion, given interval improved appearance compared to
the prior study, lymphangitic spread of tumor is not entirely
excluded. Clinical correlation and follow-up recommended.

No focal consolidation.

## 2017-05-12 IMAGING — CT CT ANGIO CHEST
2 of 6 series · 18 of 46 positions shown · IV contrast (isovue)
Comparison: Chest radiograph performed earlier today at [DATE] a.m.,
and CTA of the chest performed 06/07/2016

CLINICAL DATA: Acute onset of shortness of breath. Nausea and
vomiting. Generalized chest pain. Initial encounter.

EXAM:
CT ANGIOGRAPHY CHEST WITH CONTRAST
TECHNIQUE: Multidetector CT imaging of the chest was performed using the
standard protocol during bolus administration of intravenous
contrast. Multiplanar CT image reconstructions and MIPs were
obtained to evaluate the vascular anatomy.
CONTRAST:  100 mL of Isovue 370 IV contrast

[Series 6: thins for pacs · axial · 0.74mm/px · z∈[-309,-59]mm · 15 of 274 slices shown]
[im 12/274  lung]
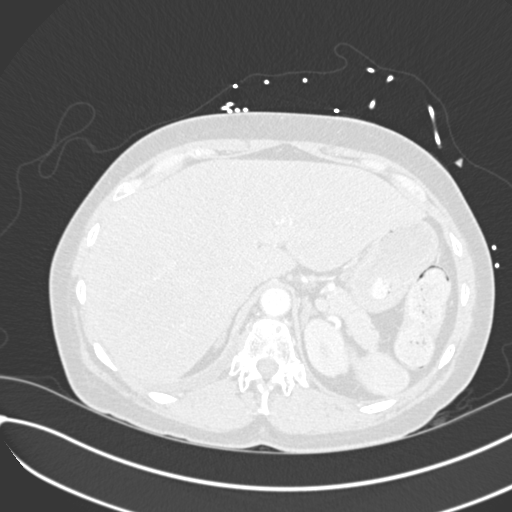
[im 36/274  soft-tissue]
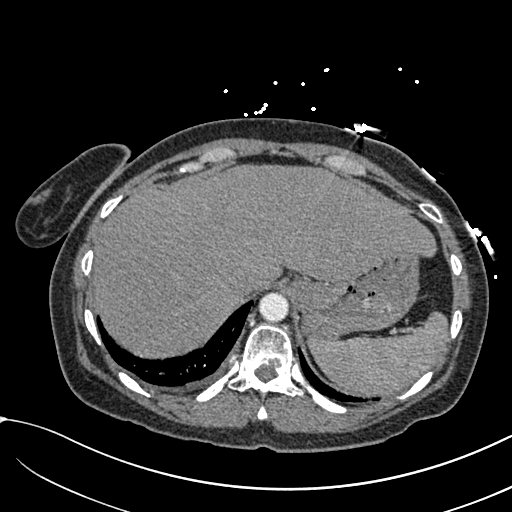
[im 48/274  lung]
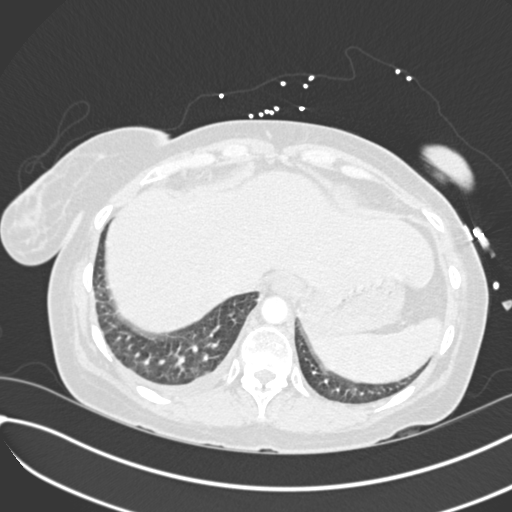
[im 72/274  soft-tissue]
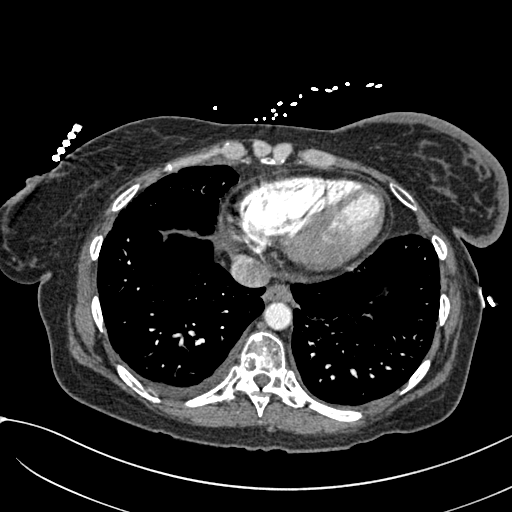
[im 84/274  lung]
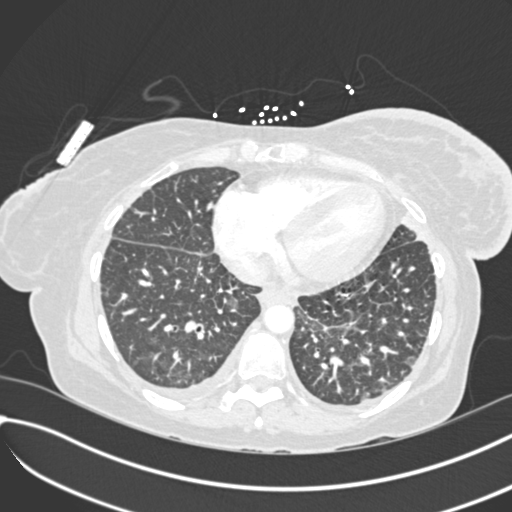
[im 107/274  soft-tissue]
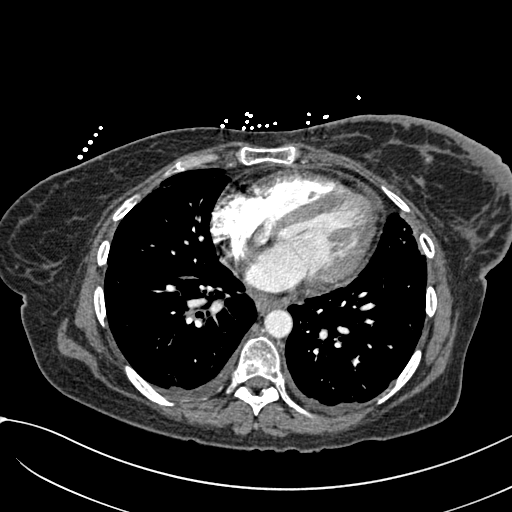
[im 119/274  lung]
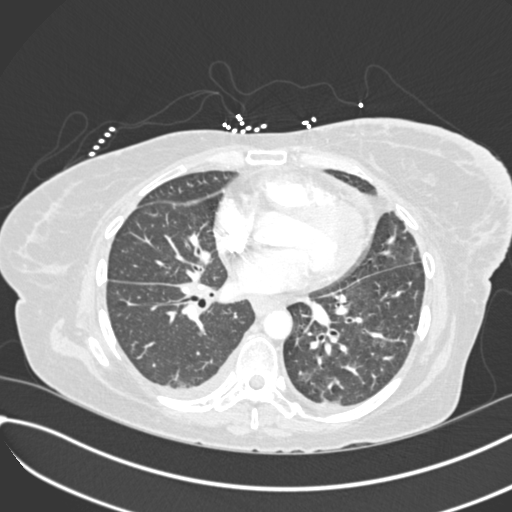
[im 143/274  soft-tissue]
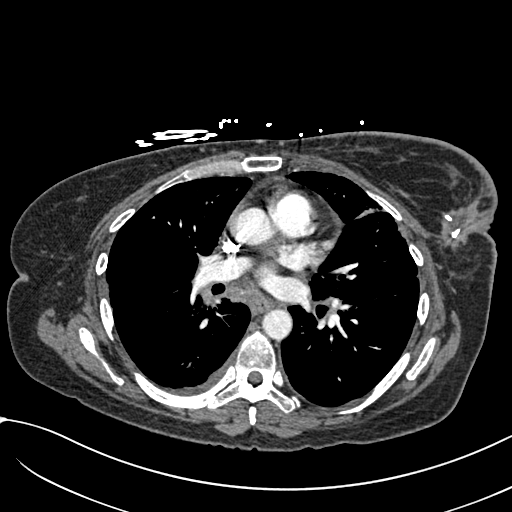
[im 155/274  lung]
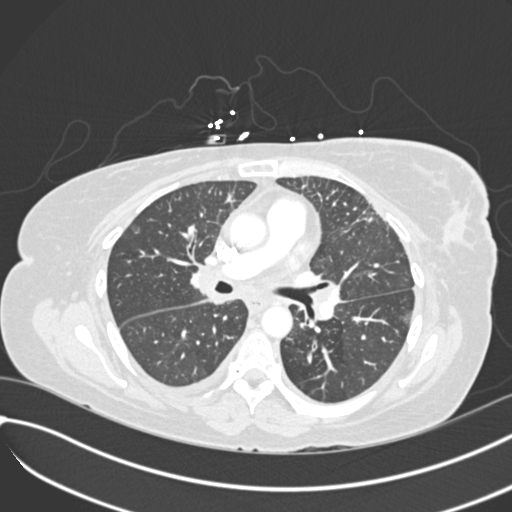
[im 167/274  soft-tissue]
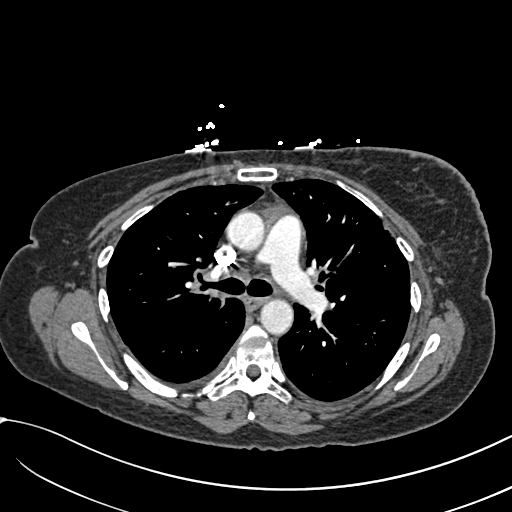
[im 190/274  lung]
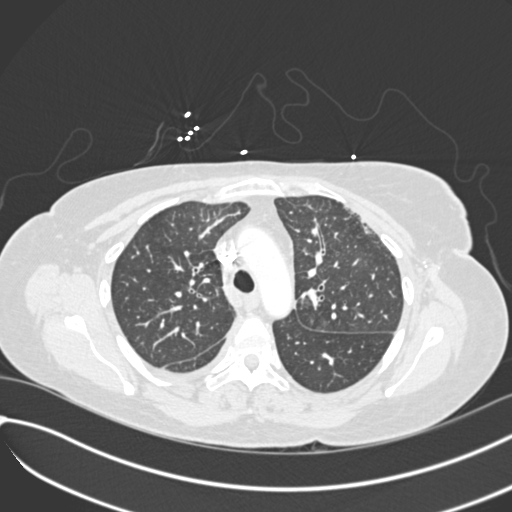
[im 202/274  soft-tissue]
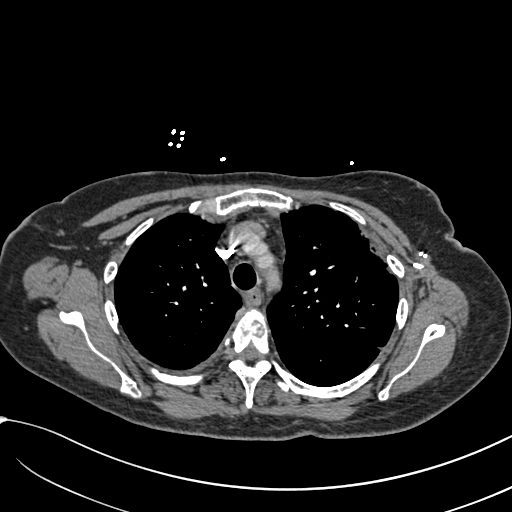
[im 226/274  lung]
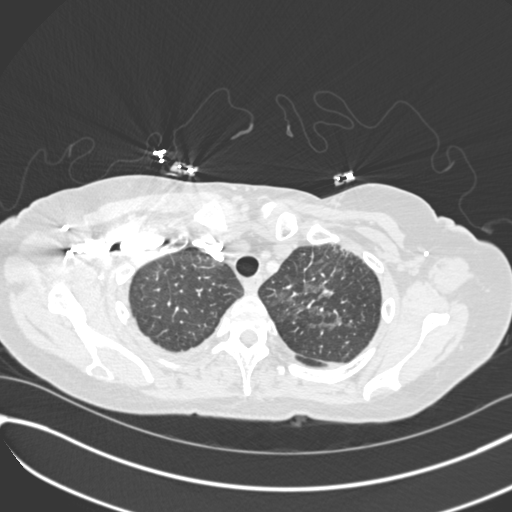
[im 238/274  soft-tissue]
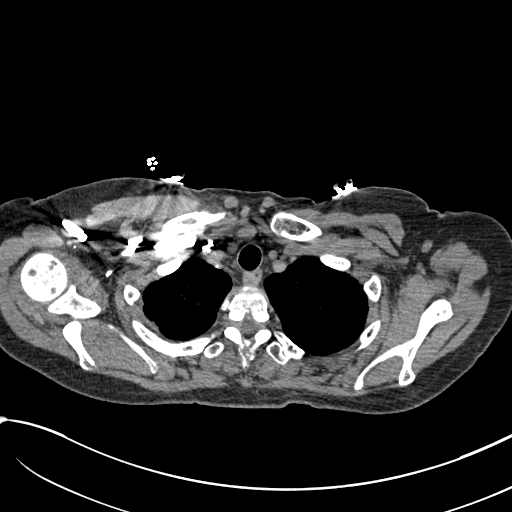
[im 262/274  lung]
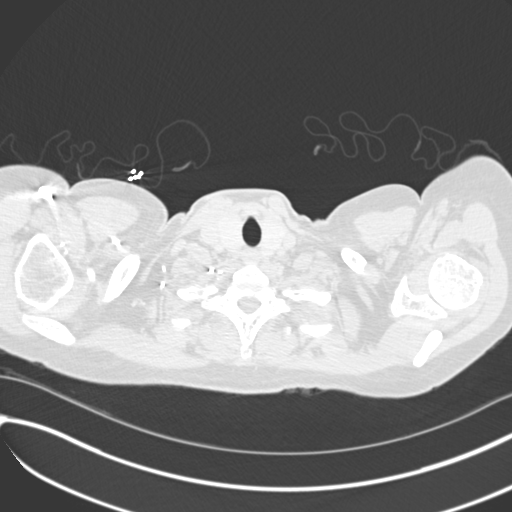

[Series 8: coronal mpr · coronal · 0.53mm/px · 3 of 114 slices shown]
[im 29/114  soft-tissue]
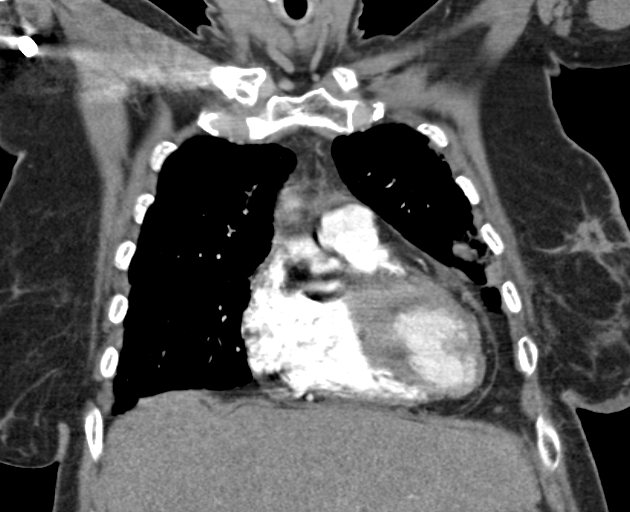
[im 57/114  soft-tissue]
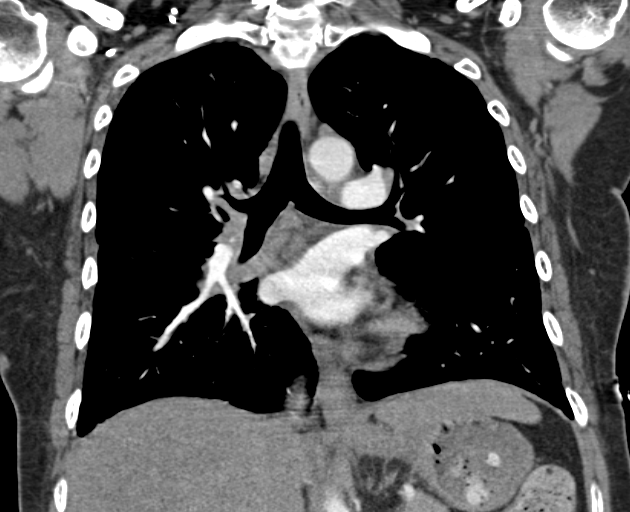
[im 85/114  soft-tissue]
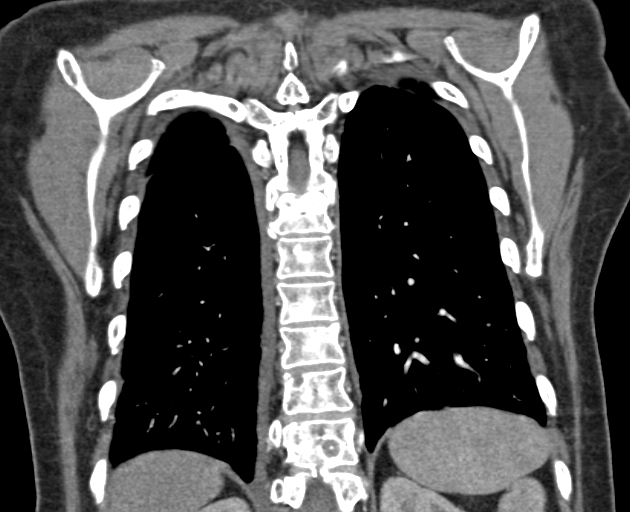

[18 of 46 positions shown; findings below may reference images not displayed]

FINDINGS: There is no evidence of pulmonary embolus.

Trace bilateral pleural effusions are noted. Interstitial prominence
is noted, concerning for mild interstitial edema. No pneumothorax is
seen. There is no evidence of pneumothorax.

Mild soft-tissue density is noted tracking about the right mainstem
bronchus and azygoesophageal recess. This is similar in appearance
to the prior study, and may reflect the patient's known metastatic
disease. No axillary lymphadenopathy is seen. The visualized
portions of the thyroid gland are unremarkable in appearance.

Skin thickening is noted along the left breast, with mild associated
soft tissue inflammation, concerning for inflammatory breast cancer.

The visualized portions of the liver and spleen are unremarkable.

Scattered sclerotic and lytic foci within the visualized osseous
structures raise concern for metastatic disease.

Review of the MIP images confirms the above findings.
IMPRESSION: 1. No evidence of pulmonary embolus.
2. Trace bilateral pleural effusions noted. Interstitial prominence
raises concern for mild interstitial edema.
3. Mild soft tissue density tracking about the right mainstem
bronchus and azygoesophageal recess is similar in appearance, and
may reflect the patient's known metastatic disease.
4. Skin thickening along the left breast, with mild associated soft
tissue inflammation, raising concern for inflammatory breast cancer,
as previously noted.
5. Scattered sclerotic and lytic foci within the visualized osseous
structures raise concern for metastatic disease.

## 2018-01-08 NOTE — Progress Notes (Signed)
This encounter was created in error - please disregard.
